# Patient Record
Sex: Male | Born: 1955
Health system: Southern US, Community
[De-identification: ages and names within clinical notes are randomized; demographics above are authoritative.]

## PROBLEM LIST (undated history)

## (undated) DIAGNOSIS — J449 Chronic obstructive pulmonary disease, unspecified: Secondary | ICD-10-CM

## (undated) DIAGNOSIS — H04123 Dry eye syndrome of bilateral lacrimal glands: Secondary | ICD-10-CM

## (undated) DIAGNOSIS — K644 Residual hemorrhoidal skin tags: Secondary | ICD-10-CM

## (undated) DIAGNOSIS — M170 Bilateral primary osteoarthritis of knee: Secondary | ICD-10-CM

## (undated) DIAGNOSIS — D369 Benign neoplasm, unspecified site: Secondary | ICD-10-CM

## (undated) DIAGNOSIS — I63411 Cerebral infarction due to embolism of right middle cerebral artery: Secondary | ICD-10-CM

## (undated) DIAGNOSIS — G44009 Cluster headache syndrome, unspecified, not intractable: Secondary | ICD-10-CM

## (undated) DIAGNOSIS — K5909 Other constipation: Secondary | ICD-10-CM

## (undated) DIAGNOSIS — M545 Low back pain: Secondary | ICD-10-CM

## (undated) DIAGNOSIS — G8929 Other chronic pain: Secondary | ICD-10-CM

## (undated) DIAGNOSIS — K648 Other hemorrhoids: Secondary | ICD-10-CM

## (undated) DIAGNOSIS — I700 Atherosclerosis of aorta: Secondary | ICD-10-CM

## (undated) DIAGNOSIS — K635 Polyp of colon: Secondary | ICD-10-CM

## (undated) DIAGNOSIS — N3941 Urge incontinence: Secondary | ICD-10-CM

## (undated) DIAGNOSIS — F411 Generalized anxiety disorder: Secondary | ICD-10-CM

## (undated) DIAGNOSIS — M25512 Pain in left shoulder: Secondary | ICD-10-CM

## (undated) DIAGNOSIS — M25511 Pain in right shoulder: Secondary | ICD-10-CM

## (undated) DIAGNOSIS — Z87442 Personal history of urinary calculi: Secondary | ICD-10-CM

## (undated) DIAGNOSIS — C801 Malignant (primary) neoplasm, unspecified: Secondary | ICD-10-CM

## (undated) DIAGNOSIS — H269 Unspecified cataract: Secondary | ICD-10-CM

## (undated) DIAGNOSIS — N200 Calculus of kidney: Secondary | ICD-10-CM

## (undated) DIAGNOSIS — I2699 Other pulmonary embolism without acute cor pulmonale: Secondary | ICD-10-CM

## (undated) DIAGNOSIS — H35033 Hypertensive retinopathy, bilateral: Secondary | ICD-10-CM

## (undated) DIAGNOSIS — R7611 Nonspecific reaction to tuberculin skin test without active tuberculosis: Secondary | ICD-10-CM

## (undated) DIAGNOSIS — E669 Obesity, unspecified: Secondary | ICD-10-CM

## (undated) HISTORY — DX: Polyp of colon: K63.5

## (undated) HISTORY — PX: FOOT SURGERY: SHX648

## (undated) HISTORY — DX: Hypertensive retinopathy, bilateral: H35.033

## (undated) HISTORY — DX: Low back pain: M54.5

## (undated) HISTORY — DX: Cerebral infarction due to embolism of right middle cerebral artery: I63.411

## (undated) HISTORY — DX: Dry eye syndrome of bilateral lacrimal glands: H04.123

## (undated) HISTORY — DX: Other constipation: K59.09

## (undated) HISTORY — DX: Other pulmonary embolism without acute cor pulmonale: I26.99

## (undated) HISTORY — DX: Pain in right shoulder: M25.511

## (undated) HISTORY — DX: Atherosclerosis of aorta: I70.0

## (undated) HISTORY — DX: Obesity, unspecified: E66.9

## (undated) HISTORY — DX: Urge incontinence: N39.41

## (undated) HISTORY — DX: Cluster headache syndrome, unspecified, not intractable: G44.009

## (undated) HISTORY — DX: Generalized anxiety disorder: F41.1

## (undated) HISTORY — DX: Residual hemorrhoidal skin tags: K64.4

## (undated) HISTORY — DX: Pain in left shoulder: M25.512

## (undated) HISTORY — DX: Nonspecific reaction to tuberculin skin test without active tuberculosis: R76.11

## (undated) HISTORY — DX: Calculus of kidney: N20.0

## (undated) HISTORY — DX: Other chronic pain: G89.29

## (undated) HISTORY — DX: Unspecified cataract: H26.9

## (undated) HISTORY — DX: Benign neoplasm, unspecified site: D36.9

## (undated) HISTORY — DX: Other hemorrhoids: K64.8

## (undated) HISTORY — DX: Bilateral primary osteoarthritis of knee: M17.0

---

## 1999-09-20 ENCOUNTER — Encounter: Payer: Self-pay | Admitting: Emergency Medicine

## 1999-09-20 ENCOUNTER — Emergency Department (HOSPITAL_COMMUNITY): Admission: EM | Admit: 1999-09-20 | Discharge: 1999-09-20 | Payer: Self-pay | Admitting: Emergency Medicine

## 2001-11-28 ENCOUNTER — Ambulatory Visit (HOSPITAL_BASED_OUTPATIENT_CLINIC_OR_DEPARTMENT_OTHER): Admission: RE | Admit: 2001-11-28 | Discharge: 2001-11-28 | Payer: Self-pay | Admitting: Orthopedic Surgery

## 2001-11-28 HISTORY — PX: I & D EXTREMITY: SHX5045

## 2003-03-15 ENCOUNTER — Emergency Department (HOSPITAL_COMMUNITY): Admission: AD | Admit: 2003-03-15 | Discharge: 2003-03-15 | Payer: Self-pay

## 2003-03-22 ENCOUNTER — Emergency Department (HOSPITAL_COMMUNITY): Admission: EM | Admit: 2003-03-22 | Discharge: 2003-03-22 | Payer: Self-pay | Admitting: Emergency Medicine

## 2003-03-28 ENCOUNTER — Encounter: Payer: Self-pay | Admitting: Family Medicine

## 2003-03-28 ENCOUNTER — Encounter: Admission: RE | Admit: 2003-03-28 | Discharge: 2003-03-28 | Payer: Self-pay | Admitting: Family Medicine

## 2003-05-05 ENCOUNTER — Ambulatory Visit (HOSPITAL_COMMUNITY): Admission: RE | Admit: 2003-05-05 | Discharge: 2003-05-05 | Payer: Self-pay | Admitting: Neurology

## 2004-08-08 DIAGNOSIS — I6930 Unspecified sequelae of cerebral infarction: Secondary | ICD-10-CM | POA: Insufficient documentation

## 2004-08-08 DIAGNOSIS — I63411 Cerebral infarction due to embolism of right middle cerebral artery: Secondary | ICD-10-CM

## 2004-08-08 HISTORY — DX: Cerebral infarction due to embolism of right middle cerebral artery: I63.411

## 2004-09-27 ENCOUNTER — Ambulatory Visit: Payer: Self-pay | Admitting: Family Medicine

## 2004-09-27 ENCOUNTER — Inpatient Hospital Stay (HOSPITAL_COMMUNITY): Admission: AD | Admit: 2004-09-27 | Discharge: 2004-10-04 | Payer: Self-pay | Admitting: Family Medicine

## 2004-09-28 ENCOUNTER — Ambulatory Visit: Payer: Self-pay | Admitting: Physical Medicine & Rehabilitation

## 2004-09-28 ENCOUNTER — Encounter (INDEPENDENT_AMBULATORY_CARE_PROVIDER_SITE_OTHER): Payer: Self-pay | Admitting: Cardiology

## 2004-09-30 DIAGNOSIS — I2699 Other pulmonary embolism without acute cor pulmonale: Secondary | ICD-10-CM

## 2004-09-30 DIAGNOSIS — Z86718 Personal history of other venous thrombosis and embolism: Secondary | ICD-10-CM | POA: Insufficient documentation

## 2004-09-30 HISTORY — DX: Other pulmonary embolism without acute cor pulmonale: I26.99

## 2004-10-04 ENCOUNTER — Inpatient Hospital Stay (HOSPITAL_COMMUNITY)
Admission: RE | Admit: 2004-10-04 | Discharge: 2004-10-27 | Payer: Self-pay | Admitting: Physical Medicine & Rehabilitation

## 2004-10-27 ENCOUNTER — Ambulatory Visit: Payer: Self-pay | Admitting: Family Medicine

## 2004-10-28 ENCOUNTER — Ambulatory Visit: Payer: Self-pay | Admitting: *Deleted

## 2004-10-29 ENCOUNTER — Encounter
Admission: RE | Admit: 2004-10-29 | Discharge: 2005-01-27 | Payer: Self-pay | Admitting: Physical Medicine & Rehabilitation

## 2004-11-10 ENCOUNTER — Ambulatory Visit: Payer: Self-pay | Admitting: Family Medicine

## 2004-11-25 ENCOUNTER — Ambulatory Visit: Payer: Self-pay | Admitting: Physical Medicine & Rehabilitation

## 2004-12-13 ENCOUNTER — Ambulatory Visit: Payer: Self-pay | Admitting: Family Medicine

## 2005-01-12 ENCOUNTER — Ambulatory Visit: Payer: Self-pay | Admitting: Family Medicine

## 2005-01-24 ENCOUNTER — Ambulatory Visit: Payer: Self-pay | Admitting: Family Medicine

## 2005-01-28 ENCOUNTER — Encounter
Admission: RE | Admit: 2005-01-28 | Discharge: 2005-03-28 | Payer: Self-pay | Admitting: Physical Medicine & Rehabilitation

## 2005-02-09 ENCOUNTER — Ambulatory Visit: Payer: Self-pay | Admitting: Family Medicine

## 2005-02-10 ENCOUNTER — Ambulatory Visit: Payer: Self-pay | Admitting: Physical Medicine & Rehabilitation

## 2005-02-10 ENCOUNTER — Encounter
Admission: RE | Admit: 2005-02-10 | Discharge: 2005-05-11 | Payer: Self-pay | Admitting: Physical Medicine & Rehabilitation

## 2005-03-10 ENCOUNTER — Ambulatory Visit: Payer: Self-pay | Admitting: Family Medicine

## 2005-03-23 ENCOUNTER — Ambulatory Visit: Payer: Self-pay | Admitting: Internal Medicine

## 2005-04-25 ENCOUNTER — Ambulatory Visit: Payer: Self-pay | Admitting: Family Medicine

## 2005-05-09 ENCOUNTER — Ambulatory Visit: Payer: Self-pay | Admitting: Internal Medicine

## 2005-05-11 ENCOUNTER — Ambulatory Visit: Payer: Self-pay | Admitting: Physical Medicine & Rehabilitation

## 2005-05-11 ENCOUNTER — Encounter
Admission: RE | Admit: 2005-05-11 | Discharge: 2005-08-09 | Payer: Self-pay | Admitting: Physical Medicine & Rehabilitation

## 2005-05-23 ENCOUNTER — Ambulatory Visit: Payer: Self-pay | Admitting: Hematology and Oncology

## 2005-06-09 ENCOUNTER — Ambulatory Visit: Payer: Self-pay | Admitting: Family Medicine

## 2005-07-18 ENCOUNTER — Ambulatory Visit: Payer: Self-pay | Admitting: Family Medicine

## 2005-08-10 ENCOUNTER — Encounter
Admission: RE | Admit: 2005-08-10 | Discharge: 2005-11-08 | Payer: Self-pay | Admitting: Physical Medicine & Rehabilitation

## 2005-08-10 ENCOUNTER — Ambulatory Visit: Payer: Self-pay | Admitting: Physical Medicine & Rehabilitation

## 2005-08-18 ENCOUNTER — Ambulatory Visit: Payer: Self-pay | Admitting: Internal Medicine

## 2005-09-13 ENCOUNTER — Encounter
Admission: RE | Admit: 2005-09-13 | Discharge: 2005-12-12 | Payer: Self-pay | Admitting: Physical Medicine & Rehabilitation

## 2005-09-15 ENCOUNTER — Ambulatory Visit: Payer: Self-pay | Admitting: Family Medicine

## 2005-09-16 ENCOUNTER — Ambulatory Visit: Payer: Self-pay | Admitting: Family Medicine

## 2005-10-17 ENCOUNTER — Ambulatory Visit: Payer: Self-pay | Admitting: Family Medicine

## 2005-10-19 ENCOUNTER — Ambulatory Visit: Payer: Self-pay | Admitting: Hematology and Oncology

## 2005-10-20 LAB — CBC WITH DIFFERENTIAL/PLATELET
BASO%: 0.5 % (ref 0.0–2.0)
Basophils Absolute: 0 10*3/uL (ref 0.0–0.1)
EOS%: 4.7 % (ref 0.0–7.0)
Eosinophils Absolute: 0.2 10*3/uL (ref 0.0–0.5)
HCT: 42.7 % (ref 38.7–49.9)
HGB: 14.1 g/dL (ref 13.0–17.1)
LYMPH%: 51.4 % — ABNORMAL HIGH (ref 14.0–48.0)
MCH: 27.2 pg — ABNORMAL LOW (ref 28.0–33.4)
MCHC: 32.9 g/dL (ref 32.0–35.9)
MCV: 82.6 fL (ref 81.6–98.0)
MONO#: 0.4 10*3/uL (ref 0.1–0.9)
MONO%: 8.2 % (ref 0.0–13.0)
NEUT#: 1.9 10*3/uL (ref 1.5–6.5)
NEUT%: 35.2 % — ABNORMAL LOW (ref 40.0–75.0)
Platelets: 205 10*3/uL (ref 145–400)
RBC: 5.17 10*6/uL (ref 4.20–5.71)
RDW: 14.3 % (ref 11.2–14.6)
WBC: 5.3 10*3/uL (ref 4.0–10.0)
lymph#: 2.7 10*3/uL (ref 0.9–3.3)

## 2005-10-20 LAB — COMPREHENSIVE METABOLIC PANEL
ALT: 35 U/L (ref 0–40)
AST: 21 U/L (ref 0–37)
Albumin: 4.5 g/dL (ref 3.5–5.2)
Alkaline Phosphatase: 59 U/L (ref 39–117)
BUN: 11 mg/dL (ref 6–23)
CO2: 24 mEq/L (ref 19–32)
Calcium: 9.7 mg/dL (ref 8.4–10.5)
Chloride: 107 mEq/L (ref 96–112)
Creatinine, Ser: 0.9 mg/dL (ref 0.4–1.5)
Glucose, Bld: 99 mg/dL (ref 70–99)
Potassium: 3.9 mEq/L (ref 3.5–5.3)
Sodium: 140 mEq/L (ref 135–145)
Total Bilirubin: 0.7 mg/dL (ref 0.3–1.2)
Total Protein: 7.8 g/dL (ref 6.0–8.3)

## 2005-11-17 ENCOUNTER — Ambulatory Visit: Payer: Self-pay | Admitting: Family Medicine

## 2005-11-25 LAB — CBC WITH DIFFERENTIAL/PLATELET
BASO%: 1 % (ref 0.0–2.0)
Basophils Absolute: 0.1 10*3/uL (ref 0.0–0.1)
EOS%: 3.8 % (ref 0.0–7.0)
Eosinophils Absolute: 0.2 10*3/uL (ref 0.0–0.5)
HCT: 42.9 % (ref 38.7–49.9)
HGB: 14.3 g/dL (ref 13.0–17.1)
LYMPH%: 51.5 % — ABNORMAL HIGH (ref 14.0–48.0)
MCH: 27.3 pg — ABNORMAL LOW (ref 28.0–33.4)
MCHC: 33.3 g/dL (ref 32.0–35.9)
MCV: 82.1 fL (ref 81.6–98.0)
MONO#: 0.3 10*3/uL (ref 0.1–0.9)
MONO%: 6.6 % (ref 0.0–13.0)
NEUT#: 1.9 10*3/uL (ref 1.5–6.5)
NEUT%: 37.1 % — ABNORMAL LOW (ref 40.0–75.0)
Platelets: 213 10*3/uL (ref 145–400)
RBC: 5.23 10*6/uL (ref 4.20–5.71)
RDW: 14.2 % (ref 11.2–14.6)
WBC: 5.2 10*3/uL (ref 4.0–10.0)
lymph#: 2.7 10*3/uL (ref 0.9–3.3)

## 2005-11-30 LAB — CARDIOLIPIN ANTIBODIES, IGG, IGM, IGA
Anticardiolipin IgA: <7 [APL'U] (ref <13)
Anticardiolipin IgG: 10 [GPL'U] (ref <11)
Anticardiolipin IgM: <7 [MPL'U] (ref <10)

## 2005-11-30 LAB — PROTEIN C, TOTAL AND FUNCTIONAL PANEL
Protein C, Functional: 146 % (ref 77–173)
Protein C, Total: 117 % (ref 63–153)

## 2005-11-30 LAB — PROTHROMBIN GENE MUTATION

## 2005-11-30 LAB — PROTEIN S, TOTAL AND FUNCTIONAL PANEL
Protein S Ag, Total: 106 % (ref 58–146)
Protein S, Functional: 83 % (ref 66–143)

## 2005-11-30 LAB — ANTITHROMBIN III: AntiThromb III Func: 110 % (ref 75–120)

## 2005-11-30 LAB — FACTOR 5 LEIDEN

## 2005-12-02 ENCOUNTER — Ambulatory Visit: Payer: Self-pay | Admitting: Hematology and Oncology

## 2005-12-13 ENCOUNTER — Encounter
Admission: RE | Admit: 2005-12-13 | Discharge: 2006-03-13 | Payer: Self-pay | Admitting: Physical Medicine & Rehabilitation

## 2005-12-27 ENCOUNTER — Ambulatory Visit: Payer: Self-pay | Admitting: Family Medicine

## 2006-01-17 ENCOUNTER — Ambulatory Visit: Payer: Self-pay | Admitting: Family Medicine

## 2006-02-15 ENCOUNTER — Ambulatory Visit: Payer: Self-pay | Admitting: Family Medicine

## 2006-03-06 ENCOUNTER — Ambulatory Visit: Payer: Self-pay | Admitting: Family Medicine

## 2006-04-03 ENCOUNTER — Ambulatory Visit: Payer: Self-pay | Admitting: Family Medicine

## 2006-04-17 ENCOUNTER — Ambulatory Visit: Payer: Self-pay | Admitting: Family Medicine

## 2006-05-01 ENCOUNTER — Ambulatory Visit: Payer: Self-pay | Admitting: Family Medicine

## 2006-05-16 ENCOUNTER — Ambulatory Visit: Payer: Self-pay | Admitting: Family Medicine

## 2006-06-12 ENCOUNTER — Ambulatory Visit: Payer: Self-pay | Admitting: Family Medicine

## 2006-06-28 ENCOUNTER — Ambulatory Visit: Payer: Self-pay | Admitting: Family Medicine

## 2006-07-05 ENCOUNTER — Ambulatory Visit: Payer: Self-pay | Admitting: *Deleted

## 2006-07-10 ENCOUNTER — Ambulatory Visit: Payer: Self-pay | Admitting: Family Medicine

## 2006-08-08 ENCOUNTER — Ambulatory Visit: Payer: Self-pay | Admitting: Family Medicine

## 2006-09-05 ENCOUNTER — Ambulatory Visit: Payer: Self-pay | Admitting: Family Medicine

## 2006-09-27 ENCOUNTER — Ambulatory Visit: Payer: Self-pay | Admitting: Family Medicine

## 2006-11-08 ENCOUNTER — Ambulatory Visit: Payer: Self-pay | Admitting: Family Medicine

## 2006-11-16 DIAGNOSIS — Z9189 Other specified personal risk factors, not elsewhere classified: Secondary | ICD-10-CM | POA: Insufficient documentation

## 2006-11-16 DIAGNOSIS — K5900 Constipation, unspecified: Secondary | ICD-10-CM | POA: Insufficient documentation

## 2006-11-16 DIAGNOSIS — E785 Hyperlipidemia, unspecified: Secondary | ICD-10-CM | POA: Insufficient documentation

## 2006-12-06 ENCOUNTER — Ambulatory Visit: Payer: Self-pay | Admitting: Family Medicine

## 2006-12-06 ENCOUNTER — Ambulatory Visit: Payer: Self-pay | Admitting: Hematology and Oncology

## 2006-12-08 LAB — CBC WITH DIFFERENTIAL/PLATELET
BASO%: 0.3 % (ref 0.0–2.0)
Basophils Absolute: 0 10*3/uL (ref 0.0–0.1)
EOS%: 3.2 % (ref 0.0–7.0)
Eosinophils Absolute: 0.2 10*3/uL (ref 0.0–0.5)
HCT: 38.8 % (ref 38.7–49.9)
HGB: 13.3 g/dL (ref 13.0–17.1)
LYMPH%: 49 % — ABNORMAL HIGH (ref 14.0–48.0)
MCH: 28.1 pg (ref 28.0–33.4)
MCHC: 34.3 g/dL (ref 32.0–35.9)
MCV: 82 fL (ref 81.6–98.0)
MONO#: 0.4 10*3/uL (ref 0.1–0.9)
MONO%: 7.8 % (ref 0.0–13.0)
NEUT#: 2 10*3/uL (ref 1.5–6.5)
NEUT%: 39.7 % — ABNORMAL LOW (ref 40.0–75.0)
Platelets: 189 10*3/uL (ref 145–400)
RBC: 4.73 10*6/uL (ref 4.20–5.71)
RDW: 14 % (ref 11.2–14.6)
WBC: 5 10*3/uL (ref 4.0–10.0)
lymph#: 2.4 10*3/uL (ref 0.9–3.3)

## 2006-12-08 LAB — COMPREHENSIVE METABOLIC PANEL
ALT: 27 U/L (ref 0–53)
AST: 19 U/L (ref 0–37)
Albumin: 4.3 g/dL (ref 3.5–5.2)
Alkaline Phosphatase: 62 U/L (ref 39–117)
BUN: 6 mg/dL (ref 6–23)
CO2: 23 mEq/L (ref 19–32)
Calcium: 9.4 mg/dL (ref 8.4–10.5)
Chloride: 107 mEq/L (ref 96–112)
Creatinine, Ser: 0.84 mg/dL (ref 0.40–1.50)
Glucose, Bld: 98 mg/dL (ref 70–99)
Potassium: 4 mEq/L (ref 3.5–5.3)
Sodium: 140 mEq/L (ref 135–145)
Total Bilirubin: 0.7 mg/dL (ref 0.3–1.2)
Total Protein: 7.6 g/dL (ref 6.0–8.3)

## 2007-01-03 ENCOUNTER — Ambulatory Visit: Payer: Self-pay | Admitting: Family Medicine

## 2007-02-05 ENCOUNTER — Ambulatory Visit: Payer: Self-pay | Admitting: Family Medicine

## 2007-02-19 ENCOUNTER — Ambulatory Visit: Payer: Self-pay | Admitting: Family Medicine

## 2007-03-14 ENCOUNTER — Encounter (INDEPENDENT_AMBULATORY_CARE_PROVIDER_SITE_OTHER): Payer: Self-pay | Admitting: *Deleted

## 2007-03-19 ENCOUNTER — Ambulatory Visit: Payer: Self-pay | Admitting: Family Medicine

## 2007-04-10 ENCOUNTER — Ambulatory Visit: Payer: Self-pay | Admitting: Family Medicine

## 2007-04-19 ENCOUNTER — Ambulatory Visit: Payer: Self-pay | Admitting: Family Medicine

## 2007-04-19 ENCOUNTER — Encounter
Admission: RE | Admit: 2007-04-19 | Discharge: 2007-04-19 | Payer: Self-pay | Admitting: Physical Medicine & Rehabilitation

## 2007-04-19 LAB — CONVERTED CEMR LAB
BUN: 9 mg/dL (ref 6–23)
CO2: 20 meq/L (ref 19–32)
Calcium: 9.4 mg/dL (ref 8.4–10.5)
Chloride: 110 meq/L (ref 96–112)
Cholesterol: 118 mg/dL (ref 0–200)
Creatinine, Ser: 0.75 mg/dL (ref 0.40–1.50)
Glucose, Bld: 95 mg/dL (ref 70–99)
HDL: 47 mg/dL (ref >39)
LDL Cholesterol: 56 mg/dL (ref 0–99)
PSA: 0.34 ng/mL (ref 0.10–4.00)
Potassium: 4.1 meq/L (ref 3.5–5.3)
Sodium: 143 meq/L (ref 135–145)
Total CHOL/HDL Ratio: 2.5
Triglycerides: 73 mg/dL (ref <150)
VLDL: 15 mg/dL (ref 0–40)

## 2007-05-17 ENCOUNTER — Ambulatory Visit: Payer: Self-pay | Admitting: Family Medicine

## 2007-06-14 ENCOUNTER — Ambulatory Visit: Payer: Self-pay | Admitting: Family Medicine

## 2007-07-12 ENCOUNTER — Ambulatory Visit: Payer: Self-pay | Admitting: Family Medicine

## 2007-08-09 ENCOUNTER — Ambulatory Visit: Payer: Self-pay | Admitting: Family Medicine

## 2007-09-06 ENCOUNTER — Ambulatory Visit: Payer: Self-pay | Admitting: Internal Medicine

## 2007-10-08 ENCOUNTER — Ambulatory Visit: Payer: Self-pay | Admitting: Internal Medicine

## 2007-10-22 ENCOUNTER — Ambulatory Visit (HOSPITAL_COMMUNITY): Admission: RE | Admit: 2007-10-22 | Discharge: 2007-10-22 | Payer: Self-pay | Admitting: Family Medicine

## 2007-11-05 ENCOUNTER — Ambulatory Visit: Payer: Self-pay | Admitting: Internal Medicine

## 2007-11-15 ENCOUNTER — Ambulatory Visit: Payer: Self-pay | Admitting: Internal Medicine

## 2007-11-20 ENCOUNTER — Ambulatory Visit (HOSPITAL_COMMUNITY): Admission: RE | Admit: 2007-11-20 | Discharge: 2007-11-20 | Payer: Self-pay | Admitting: Family Medicine

## 2007-12-03 ENCOUNTER — Ambulatory Visit: Payer: Self-pay | Admitting: Family Medicine

## 2007-12-03 ENCOUNTER — Encounter
Admission: RE | Admit: 2007-12-03 | Discharge: 2007-12-05 | Payer: Self-pay | Admitting: Physical Medicine & Rehabilitation

## 2007-12-04 ENCOUNTER — Ambulatory Visit: Payer: Self-pay | Admitting: Hematology and Oncology

## 2007-12-05 ENCOUNTER — Ambulatory Visit: Payer: Self-pay | Admitting: Physical Medicine & Rehabilitation

## 2007-12-18 ENCOUNTER — Encounter
Admission: RE | Admit: 2007-12-18 | Discharge: 2007-12-26 | Payer: Self-pay | Admitting: Physical Medicine & Rehabilitation

## 2007-12-19 ENCOUNTER — Ambulatory Visit: Payer: Self-pay | Admitting: Family Medicine

## 2008-01-01 ENCOUNTER — Ambulatory Visit: Payer: Self-pay | Admitting: Family Medicine

## 2008-01-29 ENCOUNTER — Ambulatory Visit: Payer: Self-pay | Admitting: Family Medicine

## 2008-01-29 LAB — CONVERTED CEMR LAB
INR: 2.4 — ABNORMAL HIGH (ref 0.0–1.5)
Prothrombin Time: 27.6 s — ABNORMAL HIGH (ref 11.6–15.2)

## 2008-02-26 ENCOUNTER — Encounter
Admission: RE | Admit: 2008-02-26 | Discharge: 2008-05-26 | Payer: Self-pay | Admitting: Physical Medicine & Rehabilitation

## 2008-02-28 ENCOUNTER — Ambulatory Visit: Payer: Self-pay | Admitting: Family Medicine

## 2008-02-28 ENCOUNTER — Ambulatory Visit: Payer: Self-pay | Admitting: Physical Medicine & Rehabilitation

## 2008-02-28 ENCOUNTER — Ambulatory Visit: Payer: Self-pay | Admitting: Internal Medicine

## 2008-02-28 ENCOUNTER — Encounter (INDEPENDENT_AMBULATORY_CARE_PROVIDER_SITE_OTHER): Payer: Self-pay | Admitting: *Deleted

## 2008-02-28 LAB — CONVERTED CEMR LAB

## 2008-03-04 ENCOUNTER — Ambulatory Visit: Payer: Self-pay | Admitting: Family Medicine

## 2008-03-04 LAB — CONVERTED CEMR LAB
INR: 2.6 — ABNORMAL HIGH (ref 0.0–1.5)
Prothrombin Time: 30 s — ABNORMAL HIGH (ref 11.6–15.2)

## 2008-04-01 ENCOUNTER — Ambulatory Visit: Payer: Self-pay | Admitting: Family Medicine

## 2008-04-01 LAB — CONVERTED CEMR LAB
INR: 2.5 — ABNORMAL HIGH (ref 0.0–1.5)
Prothrombin Time: 28.7 s — ABNORMAL HIGH (ref 11.6–15.2)

## 2008-04-24 ENCOUNTER — Ambulatory Visit: Payer: Self-pay | Admitting: Physical Medicine & Rehabilitation

## 2008-04-29 ENCOUNTER — Ambulatory Visit: Payer: Self-pay | Admitting: Family Medicine

## 2008-04-29 LAB — CONVERTED CEMR LAB
INR: 2.8 — ABNORMAL HIGH (ref 0.0–1.5)
Prothrombin Time: 31.9 s — ABNORMAL HIGH (ref 11.6–15.2)

## 2008-05-27 ENCOUNTER — Ambulatory Visit: Payer: Self-pay | Admitting: Family Medicine

## 2008-05-27 LAB — CONVERTED CEMR LAB
INR: 2.4 — ABNORMAL HIGH (ref 0.0–1.5)
Prothrombin Time: 27.4 s — ABNORMAL HIGH (ref 11.6–15.2)

## 2008-06-24 ENCOUNTER — Ambulatory Visit: Payer: Self-pay | Admitting: Family Medicine

## 2008-07-22 ENCOUNTER — Ambulatory Visit: Payer: Self-pay | Admitting: Family Medicine

## 2008-08-15 ENCOUNTER — Ambulatory Visit: Payer: Self-pay | Admitting: Family Medicine

## 2008-08-15 LAB — CONVERTED CEMR LAB
ALT: 33 units/L (ref 0–53)
AST: 25 units/L (ref 0–37)
Albumin: 4.5 g/dL (ref 3.5–5.2)
Alkaline Phosphatase: 56 units/L (ref 39–117)
BUN: 11 mg/dL (ref 6–23)
Basophils Absolute: 0 10*3/uL (ref 0.0–0.1)
Basophils Relative: 0 % (ref 0–1)
CO2: 20 meq/L (ref 19–32)
Calcium: 9.8 mg/dL (ref 8.4–10.5)
Chloride: 109 meq/L (ref 96–112)
Cholesterol: 112 mg/dL (ref 0–200)
Creatinine, Ser: 0.8 mg/dL (ref 0.40–1.50)
Eosinophils Absolute: 0.3 10*3/uL (ref 0.0–0.7)
Eosinophils Relative: 5 % (ref 0–5)
Glucose, Bld: 97 mg/dL (ref 70–99)
HCT: 44.4 % (ref 39.0–52.0)
HDL: 48 mg/dL (ref >39)
Hemoglobin: 14.3 g/dL (ref 13.0–17.0)
LDL Cholesterol: 50 mg/dL (ref 0–99)
Lymphocytes Relative: 51 % — ABNORMAL HIGH (ref 12–46)
Lymphs Abs: 2.7 10*3/uL (ref 0.7–4.0)
MCHC: 32.2 g/dL (ref 30.0–36.0)
MCV: 83.5 fL (ref 78.0–100.0)
Monocytes Absolute: 0.4 10*3/uL (ref 0.1–1.0)
Monocytes Relative: 7 % (ref 3–12)
Neutro Abs: 2 10*3/uL (ref 1.7–7.7)
Neutrophils Relative %: 37 % — ABNORMAL LOW (ref 43–77)
PSA: 0.3 ng/mL (ref 0.10–4.00)
Platelets: 183 10*3/uL (ref 150–400)
Potassium: 4.1 meq/L (ref 3.5–5.3)
RBC: 5.32 M/uL (ref 4.22–5.81)
RDW: 14.8 % (ref 11.5–15.5)
Sodium: 136 meq/L (ref 135–145)
Total Bilirubin: 0.5 mg/dL (ref 0.3–1.2)
Total CHOL/HDL Ratio: 2.3
Total Protein: 7.9 g/dL (ref 6.0–8.3)
Triglycerides: 68 mg/dL (ref <150)
VLDL: 14 mg/dL (ref 0–40)
WBC: 5.3 10*3/uL (ref 4.0–10.5)

## 2008-08-19 ENCOUNTER — Ambulatory Visit: Payer: Self-pay | Admitting: Family Medicine

## 2008-09-16 ENCOUNTER — Ambulatory Visit: Payer: Self-pay | Admitting: Family Medicine

## 2008-10-15 ENCOUNTER — Ambulatory Visit: Payer: Self-pay | Admitting: Family Medicine

## 2008-11-21 ENCOUNTER — Ambulatory Visit: Payer: Self-pay | Admitting: Family Medicine

## 2008-12-18 ENCOUNTER — Ambulatory Visit: Payer: Self-pay | Admitting: Family Medicine

## 2009-01-15 ENCOUNTER — Ambulatory Visit: Payer: Self-pay | Admitting: Family Medicine

## 2009-01-22 ENCOUNTER — Ambulatory Visit: Payer: Self-pay | Admitting: Family Medicine

## 2009-02-11 ENCOUNTER — Ambulatory Visit: Payer: Self-pay | Admitting: Internal Medicine

## 2009-02-11 ENCOUNTER — Ambulatory Visit: Payer: Self-pay | Admitting: Family Medicine

## 2009-03-12 ENCOUNTER — Ambulatory Visit: Payer: Self-pay | Admitting: Internal Medicine

## 2009-03-12 ENCOUNTER — Ambulatory Visit: Payer: Self-pay | Admitting: Family Medicine

## 2009-04-01 ENCOUNTER — Encounter
Admission: RE | Admit: 2009-04-01 | Discharge: 2009-04-30 | Payer: Self-pay | Admitting: Physical Medicine & Rehabilitation

## 2009-04-09 ENCOUNTER — Ambulatory Visit: Payer: Self-pay | Admitting: Family Medicine

## 2009-04-09 ENCOUNTER — Encounter
Admission: RE | Admit: 2009-04-09 | Discharge: 2009-06-24 | Payer: Self-pay | Admitting: Physical Medicine & Rehabilitation

## 2009-04-13 ENCOUNTER — Ambulatory Visit: Payer: Self-pay | Admitting: Physical Medicine & Rehabilitation

## 2009-04-28 ENCOUNTER — Ambulatory Visit: Payer: Self-pay | Admitting: Physical Medicine & Rehabilitation

## 2009-05-05 ENCOUNTER — Encounter
Admission: RE | Admit: 2009-05-05 | Discharge: 2009-06-23 | Payer: Self-pay | Admitting: Physical Medicine & Rehabilitation

## 2009-05-07 ENCOUNTER — Ambulatory Visit: Payer: Self-pay | Admitting: Family Medicine

## 2009-05-26 ENCOUNTER — Ambulatory Visit: Payer: Self-pay | Admitting: Physical Medicine & Rehabilitation

## 2009-06-04 ENCOUNTER — Ambulatory Visit: Payer: Self-pay | Admitting: Family Medicine

## 2009-07-14 ENCOUNTER — Ambulatory Visit: Payer: Self-pay | Admitting: Family Medicine

## 2009-08-06 ENCOUNTER — Encounter
Admission: RE | Admit: 2009-08-06 | Discharge: 2009-09-07 | Payer: Self-pay | Admitting: Physical Medicine & Rehabilitation

## 2009-08-10 ENCOUNTER — Ambulatory Visit: Payer: Self-pay | Admitting: Physical Medicine & Rehabilitation

## 2009-08-11 ENCOUNTER — Ambulatory Visit: Payer: Self-pay | Admitting: Family Medicine

## 2009-08-11 LAB — CONVERTED CEMR LAB
INR: 2.36 — ABNORMAL HIGH (ref <1.50)
Prothrombin Time: 25.6 s — ABNORMAL HIGH (ref 11.6–15.2)

## 2009-08-12 ENCOUNTER — Ambulatory Visit (HOSPITAL_COMMUNITY)
Admission: RE | Admit: 2009-08-12 | Discharge: 2009-08-12 | Payer: Self-pay | Admitting: Physical Medicine & Rehabilitation

## 2009-08-18 ENCOUNTER — Ambulatory Visit: Payer: Self-pay | Admitting: Family Medicine

## 2009-09-07 ENCOUNTER — Ambulatory Visit: Payer: Self-pay | Admitting: Physical Medicine & Rehabilitation

## 2009-09-22 ENCOUNTER — Ambulatory Visit: Payer: Self-pay | Admitting: Family Medicine

## 2009-10-27 ENCOUNTER — Ambulatory Visit: Payer: Self-pay | Admitting: Family Medicine

## 2009-11-02 ENCOUNTER — Encounter
Admission: RE | Admit: 2009-11-02 | Discharge: 2009-11-02 | Payer: Self-pay | Admitting: Physical Medicine & Rehabilitation

## 2009-11-25 ENCOUNTER — Ambulatory Visit: Payer: Self-pay | Admitting: Family Medicine

## 2009-12-09 ENCOUNTER — Ambulatory Visit: Payer: Self-pay | Admitting: Family Medicine

## 2009-12-16 ENCOUNTER — Ambulatory Visit: Payer: Self-pay | Admitting: Family Medicine

## 2009-12-23 ENCOUNTER — Ambulatory Visit: Payer: Self-pay | Admitting: Family Medicine

## 2010-01-06 ENCOUNTER — Ambulatory Visit: Payer: Self-pay | Admitting: Family Medicine

## 2010-01-26 ENCOUNTER — Ambulatory Visit: Payer: Self-pay | Admitting: Family Medicine

## 2010-02-09 ENCOUNTER — Ambulatory Visit: Payer: Self-pay | Admitting: Family Medicine

## 2010-02-23 ENCOUNTER — Ambulatory Visit: Payer: Self-pay | Admitting: Family Medicine

## 2010-03-16 ENCOUNTER — Encounter (INDEPENDENT_AMBULATORY_CARE_PROVIDER_SITE_OTHER): Payer: Self-pay | Admitting: Family Medicine

## 2010-03-16 LAB — CONVERTED CEMR LAB
INR: 4.09 — ABNORMAL HIGH (ref <1.50)
Prothrombin Time: 39.6 s — ABNORMAL HIGH (ref 11.6–15.2)

## 2010-03-25 ENCOUNTER — Encounter (INDEPENDENT_AMBULATORY_CARE_PROVIDER_SITE_OTHER): Payer: Self-pay | Admitting: Family Medicine

## 2010-03-25 LAB — CONVERTED CEMR LAB
INR: 2.3 — ABNORMAL HIGH (ref <1.50)
Prothrombin Time: 25.4 s — ABNORMAL HIGH (ref 11.6–15.2)

## 2010-04-08 ENCOUNTER — Encounter (INDEPENDENT_AMBULATORY_CARE_PROVIDER_SITE_OTHER): Payer: Self-pay | Admitting: Family Medicine

## 2010-04-08 LAB — CONVERTED CEMR LAB
INR: 2.63 — ABNORMAL HIGH (ref <1.50)
Prothrombin Time: 28.2 s — ABNORMAL HIGH (ref 11.6–15.2)

## 2010-04-29 ENCOUNTER — Encounter (INDEPENDENT_AMBULATORY_CARE_PROVIDER_SITE_OTHER): Payer: Self-pay | Admitting: Family Medicine

## 2010-04-29 LAB — CONVERTED CEMR LAB
Cholesterol: 92 mg/dL (ref 0–200)
HDL: 47 mg/dL (ref >39)
INR: 2.44 — ABNORMAL HIGH (ref <1.50)
LDL Cholesterol: 37 mg/dL (ref 0–99)
Prothrombin Time: 26.6 s — ABNORMAL HIGH (ref 11.6–15.2)
Total CHOL/HDL Ratio: 2
Triglycerides: 38 mg/dL (ref <150)
VLDL: 8 mg/dL (ref 0–40)

## 2010-11-09 NOTE — Assessment & Plan Note (Signed)
HISTORY:  [name] returns to clinic today for followup  evaluation.  We last saw him in this office on February 28, 2008.  At  that time, he had 200 IU of Botox and 8 injected into his left medial  forearm for spasticity of his left wrist and fingers.  He reports that  he had some improvement for about a week's time, but then it returned to  the baseline.  He is concerned about toe curling that he has in his left  lower extremity, which he reports is causing him some discomfort.  He is  using mainly Tylenol as needed for pain at the present time.  He does  get most of his medicines from [facility] at the present time.  His  stroke did occur in 2006.   MEDICATIONS:  1. Coumadin 5 mg daily.  2. Enteric-coated aspirin 81 mg daily.  3. Lipitor 10 mg 2 tablets daily.  4. Multivitamin daily.  5. Senokot p.r.n.  6. Tylenol Extra Strength 2 tablets nightly.  7. Doxepin nightly.   REVIEW OF SYSTEMS:  Positive for easy bleeding, constipation, and limb  swelling.   PHYSICAL EXAMINATION:  GENERAL:  Reasonably, well-appearing, middle-aged  adult male in mild-to-no acute discomfort.  VITAL SIGNS:  Blood pressure was 130/73 with pulse of 82, respiratory  rate 16, and O2 saturation 99% on room air.  MUSCULOSKELETAL:  He has 5/5 strength in his right upper and right lower  extremity.  Left lower extremity strength was 4-/5 with the exception of  ankle dorsiflexion at 2/5.  Left upper extremity strength was 2/5.   IMPRESSION:  1. 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  2. History of pulmonary embolism.  3. Dyslipidemia.  4. History of cluster headaches.   PLAN:  In the office today, we did explain that we will contact the  insurance to see if we can get approval for Botox injections into his  left lower extremity for toe-flexor spasticity.  We will give the  injection a try as he gained only temporary relief in his left upper  extremity after  recent Botox injections.  We will contact him once we  get approval from his insurance carrier.           ______________________________  Alexander English, M.D.     DC/MedQ  D:  04/24/2008 10:47:25  T:  04/25/2008 00:13:36  Job #:  [id number]

## 2010-11-09 NOTE — Assessment & Plan Note (Signed)
Alexander English returns to clinic today for followup evaluation.  We have  received approval for Botox injections in his left upper extremity.  We  are planning to do that today in the office.   The patient has had a history of right middle cerebral artery infarct  with left-sided hemiparesis.   The patient reports that overall he is doing well.  His medicines have  been unchanged.  He does not use any pain medicines but does remain on  Coumadin.  He ambulates with a single-point cane and is independent with  basic ADLs.   MEDICATIONS:  1. Coumadin 5 mg daily.  2. Enteric-coated aspirin 81 mg daily.  3. Lipitor 10 mg 2 tablets daily.  4. Multivitamin daily.  5. Senokot p.r.n.  6. Tylenol extra-strength 2 tablets nightly.  7. Sleep medicine nightly.   REVIEW OF SYSTEMS:  Positive for limb swelling.   PHYSICAL EXAMINATION:  GENERAL:  Reasonably well-appearing, middle-aged  adult male in mild-to-no acute discomfort.  VITAL SIGNS:  Blood pressure 128/65 with a pulse of 84, respiratory rate  18, and O2 saturation 98% on room air.  EXTREMITIES:  He has 2/5 strength in grip and 4/5 strength otherwise in  the left upper extremity.  Right upper extremity strength was 5/5 as was  right lower extremity.  Left lower extremity strength was 4-/5 with  exception of ankle dorsiflexion at 2/5.   IMPRESSION:  1. 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  2. History of pulmonary embolism.  3. Dyslipidemia.  4. History of cluster headaches.   In the office today, we did have the patient sign a consent for Botox  injections in the left upper extremity.  I prepared 200 international  units of botulinum toxin A.  I cleansed the left medial forearm and  injected the 200 international units into 3 sites, specifically the  flexor carpi ulnaris, flexor digitorum profundus, and flexor carpi  radialis.  The patient tolerated the procedure well and good hemostasis  was obtained at the one site that did show slight bleeding.  We will  plan on seeing the patient in followup in this office in approximately 2  months' time.  I explained to him that we are hoping for some  improvement in the flexor tone to be lesser than the left upper  extremity.  Hopefully, that will curb in the next 5-10 days and then  last for an indeterminate period of time.  We will plan on seeing him in  followup in approximately 2 months' time.           ______________________________  Ellwood Dense, M.D.     DC/MedQ  D:  02/28/2008 10:38:42  T:  02/29/2008 01:08:05  Job #:  [id number]

## 2010-11-09 NOTE — Assessment & Plan Note (Signed)
Mr. Looper returns to clinic today for followup evaluation.  I last  saw him on August 11, 2005, after right middle cerebral artery infarct  with left hemiparesis.  His original stroke was in February 2006.  He  has not followed up since February 2007 secondary to lack of insurance.  He reports that they made him wait approximately 2 years, and he now has  managed Medicare.   The patient reports that he is interested in Botox injections in his  left upper extremity.  He still has significant weakness in his left  upper extremity with some increased tone, especially in his wrist and  finger flexors.  The patient reports that his medicines have essentially  been unchanged with the only addition of Extra Strength Tylenol for left  flank pain and sleep medicine started at bedtime.  He also requested  TENS unit to be applied to his left upper extremity for motor return and  pain relief.   The patient reports that he is independent with basic ADLs and  ambulation using a single-point cane along with an ankle foot orthosis  on his left lower extremity.   MEDICATIONS:  1. Coumadin 5 mg.  2. Enteric-coated aspirin 81 mg.  3. Lipitor 10 mg 2 tablets daily.  4. Multivitamin daily.  5. Senokot p.r.n.  6. Tylenol Extra Strength 2 tablets at bedtime.  7. Sleep medicine at bedtime.   REVIEW OF SYSTEMS:  Positive for constipation and sleep apnea.   PHYSICAL EXAMINATION:  Reasonably well-appearing, middle-aged adult male  in mild-to-no acute discomfort.  Vitals were not obtained in the office today.  He has 5/5 strength in the right upper and right lower extremity.  Left-  sided strength was 2/5 in grip and 4/5 in elbow flexion and elbow  extension.  He does have slightly increased tone, especially in the  wrist flexors and finger flexors in his left upper extremity.  The left  lower extremity strength was generally 4-/5 with the exception of this  ankle dorsiflexors, which were 2/5.  He  does use an ankle foot orthosis  on his left lower extremity.   IMPRESSION:  1. 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  2. History of pulmonary embolism.  3. Dyslipidemia.  4. History of cluster headaches.   In the office today, we did give the patient a slip for TENS unit to be  obtained to use on his left upper extremity.  He has been through [facility] therapy, and he will be sent back there for this device.  We have  also started the process to obtain approval for Botox injections of 200  units into his left upper extremity for the wrist flexors and finger  flexors on his left upper extremity.  We will get that approval  hopefully over the next several weeks and then  set him up for an injection.  We will plan on seeing the patient in  followup in approximately 2-3 months' time once we get approved.           ______________________________  Ellwood Dense, M.D.     DC/MedQ  D:  12/05/2007 09:56:37  T:  12/05/2007 22:41:02  Job #:  [id number]

## 2010-11-12 NOTE — Assessment & Plan Note (Signed)
MEDICAL RECORD NUMBER:  [id number].   Alexander English returns to clinic today for followup evaluation. He reports  that he is doing well. He has finished therapy at this time and is doing a  home exercise program. His Coumadin continues to be managed by Dr. Audria Nine  at [facility]. He is ambulating with a single-point cane and lives on his  own doing his own bathing and dressing. He does wear an ankle-foot orthosis  on his left lower extremity. He still has some significant weakness of his  left hand but otherwise has fairly good use of his left upper extremity. He  is wondering about returning to driving his automatic vehicle.   MEDICATIONS:  1.  Coumadin 5 mg daily.  2.  Enteric-coated aspirin 81 mg daily.  3.  Lipitor 10 mg 2 tablets daily.  4.  Multivitamin daily.  5.  Senokot p.r.n.  6.  Magnesium citrate p.r.n.   REVIEW OF SYSTEMS:  Positive for constipation and hand swelling on the left.   PHYSICAL EXAMINATION:  Reasonably well appearing adult male with obvious  left sided weakness. Blood pressure is 107/67 with a pulse of 79,  respiratory rate 16, and O2 saturation 99% on room air. The patient has 5/5  strength throughout the right upper extremity and right lower extremity.  Left upper extremity strength was 4+/5 proximally and 3-/5 distally in the  left hand. Left leg strength was 4+/5.   IMPRESSION:  1.  Status post right middle cerebral artery infarction.  2.  Pulmonary embolism.  3.  Dyslipidemia.  4.  History of cluster headaches.   In the office today, no refill of medications is necessary. I have  encouraged him to continue doing his home exercise program, especially for  manual dexterity involving the left hand. We will plan on seeing the patient  in followup in this office in approximately two to three months' time.           ______________________________  Ellwood Dense, M.D.     DC/MedQ  D:  05/12/2005 09:45:56  T:  05/12/2005 11:01:18  Job #:   [id number]

## 2010-11-12 NOTE — Discharge Summary (Signed)
[name], [name] NO.:  [id number]   MEDICAL RECORD NO.:  [id number]          PATIENT TYPE:  IPS   LOCATION:  4032                         FACILITY:  [facility]   PHYSICIAN:  Ellwood Dense, M.D.   DATE OF BIRTH:  1955-09-17   DATE OF ADMISSION:  10/04/2004  DATE OF DISCHARGE:  10/27/2004                                 DISCHARGE SUMMARY   DISCHARGE DIAGNOSES:  1.  Embolic right inferior infarct, questionable etiology.  2.  Bilateral pulmonary embolism.  3.  Dyslipidemia.  4.  Headaches, resolving.   HISTORY OF THE PRESENT ILLNESS:  Alexander English is a 55 year old male with a  history of CVA on August 09, 2004 after a trip to [place].  He returned  from [place] on September 27, 2004 with complaints of right pleuritic chest pain  and blood-tinged sputum.  He was evaluated at [facility] and was sent to  [facility].  He was started on IV antibiotics for presumed  pneumonia.  CT angiography of the chest showed a large pulmonary embolus in  the right middle to lower lobes, and left lower lobe.  The patient was  started on Coumadin.  MRI of the brain showed acute subacute infarct of the  right middle cerebral artery.  Bilateral lower extremities Dopplers showed  no DVT.  Carotid Dopplers showed no internal carotid stenosis.  Transcranial  Doppler done was negative.  Neuro was consulted for input and recommended a  TEE as well as hypocoagulable studies; however, this could not be done as  the patient was already on heparin.  The patient is to continue on Coumadin  for at least six months for treatment of PE.  TEE done showed mitral regurg,  aortic valve and tricuspid valve, no ______________, no masses or thrombus.   The patient continued to have left hemiparesis with occasional slurred  speech especially when fatigued.  Therapies were initiated.  The patient was  at minimal assistance for transfers, moderate assistance for weight shift on  the left lower  extremity and moderate assistance for ambulating 60 feet.  PT/OT were consulted for progression.   PAST MEDICAL HISTORY:  The past medical history is significant for:  1.  Cluster headaches.  2.  CVA in February 2006.   ALLERGIES:  No known drug allergies.   SOCIAL HISTORY:  The patient lives alone in a two-level home with two steps  at the entry.  The plans are for him to be discharged to a friend's home.  He has a history of one pack/day of tobacco use and drinks a 12-pack of  alcohol on the weekends.   HOSPITAL COURSE:  Alexander English was admitted to rehab on October 04, 2004 for inpatient therapies to consist of PT and OT daily.  After admission  the patient was cross covered with Lovenox to keep Coumadin on a therapeutic  basis.  The patient was maintained on Lipitor 80 mg initially for is  dyslipidemia; however, check of LFTs on October 05, 2004 showed elevation in  the SGOT at 74, SGPT at 167, and T bili of 0.4.  The patient's Lipitor dose  was dropped to 20 mg a day.  Check of lytes revealed sodium of 138,  potassium 3.8, chloride 109, CO2 24, BUN 8, creatinine 1.0, and glucose 93.  CBC revealed a hemoglobin of 14.6, hematocrit 44.6, white count 5.7 and  platelets 574,000.  Recheck of LFTs on October 10, 2004 showed improvement  with albumin at 3.1, AST 29, ALT 70, alkaline phos 96, and T bili 0.4.   The patient had been stable during his stay.  The left hemiparesis was  slowly improving with some improvement in his strength proximally in his  left lower extremity.  The left upper extremity remained flaccid.  He was  noted to have some __________ shoulder.  Currently the patient is at  modified independent level for transfers.  In areas without distraction he  requires minimal assistance than in public areas.  He needs supervision to  ambulate 200 feet with a walker on a level surface.  The patient is noted to  be unsafe on stairs.  He is currently modified independent for  bathing,  modified independent for upper body dressing, and supervision with  assistance for lower body dressing.  Twenty-four-hour supervision is  recommended and this was attempted with a friend.  The patient's girl friend  is to provide supervision in the evenings.  He was advised to have  supervision for ambulation, otherwise he is modified independent at  wheelchair level.  He was also set up to follow up at [facility] in the  P.M. on Oct 27, 2004 to be set up as a patient as well as to assist with  medications.  Further follow up therapies are set up at [facility] to begin on Oct 29, 2004.  At the time of  discharge the patient's Coumadin is supertherapeutic with an INR of 3.1.  he  was discharged on 5 mg a day of Coumadin.  The physician at [facility] is  to adjust follow Coumadin.   DISCHARGE MEDICATIONS:  1.  Aspirin 81 mg a day.  2.  Coumadin 5 mg q.p.m.  3.  Lipitor 20 mg q.h.s.  4.  Senokot S 2 q.h.s.  5.  Multivitamin per day.   DIET:  The diet is regular.   ACTIVITY:  No strenuous activities.  He is to use his wheelchair and may be  up with assistance.   FOLLOW UP:  The patient is to follow up with Dr. Thomasena Edis on November 26, 2004 at  2:20 P.M.  He is to follow up with Dr. Pearlean Brownie in three to four weeks.  He is  to follow up with LMD at [facility] for Coumadin management and routine  medical care.  Follow up at [facility] on Oct 29, 2004, Friday,  at 9:30.   SPECIAL INSTRUCTIONS:  No alcohol.  No smoking.  No driving.      PP/MEDQ  D:  10/27/2004  T:  10/28/2004  Job:  [id number]   cc:   William A. Leveda Anna, M.D.  Fax: [phone]   Pramod P. Pearlean Brownie, MD  Fax: [phone]   [facility]

## 2010-11-12 NOTE — Assessment & Plan Note (Signed)
MEDICAL RECORD NUMBER:  [id number]   Mr. Metts returns to the clinic today for follow-up evaluation. He  reports that he is doing well. His main concern is about his left arm where  he feels he has had worsening of the strength. He especially is concerned  about the weakness that he is reporting in his left hand. He remains  independent with bathing and dressing. He ambulates with a left ankle-foot  orthosis along with a single-point cane. He also drives his vehicle. He was  discharged from therapy previously in November 2006. He continues to get  healthcare through [facility] with Dr. Audria Nine. They also manage his  Coumadin and check his INR periodically.   In terms of home exercise program, he reports that he has a splint that he  is supposed to use on his left upper extremity. He also reports that he was  instructed to do table slides. He has Thera-Bands but he reports that he  only uses those on his lower extremities. He does not have any griping  exercise that he does on a routine basis.   MEDICATIONS:  1.  Coumadin 5 mg daily.  2.  Enteric-coated aspirin 81 mg daily.  3.  Lipitor 10 mg two tablets daily.  4.  Multivitamin daily.  5.  Senokot p.r.n.  6.  Magnesium citrate p.r.n.   REVIEW OF SYSTEMS:  Positive for constipation and limb swelling.   PHYSICAL EXAMINATION:  Reasonably well-appearing middle-aged adult male in  mild to no acute discomfort. Blood pressure 120/65 with a pulse of 82,  respiratory rate 16, and O2 saturation 99% on room air. Right arm and leg  strength was 5/5. Left leg strength was 4+/5. Left grip strength was 3+/5  and left upper extremity strength otherwise was 4-/5.   IMPRESSION:  1.  Status post right middle cerebral artery infarction.  2.  Pulmonary embolism.  3.  Dyslipidemia.  4.  History of cluster headaches.   In the office today, no refill on medication is necessary. We have referred  him back to outpatient occupational therapy to  see if they can give him  gripping exercise and evaluate him for any need for restart of therapies for  short-term. He does need a slightly more regimented home exercise program if  he is going to continue that on his own outside of therapy. We will plan on  seeing him in follow-up in this office in approximately 2 months' time.           ______________________________  Ellwood Dense, M.D.     DC/MedQ  D:  08/11/2005 10:29:22  T:  08/11/2005 11:31:22  Job #:  [id number]

## 2010-11-12 NOTE — Consult Note (Signed)
Alexander English, Alexander English           ACCOUNT NO.:  [id number]   MEDICAL RECORD NO.:  [id number]          PATIENT TYPE:  INP   LOCATION:  6711                         FACILITY:  [facility]   PHYSICIAN:  Pramod P. Pearlean Brownie, MD    DATE OF BIRTH:  1955-11-08   DATE OF CONSULTATION:  09/29/2004  DATE OF DISCHARGE:                                   CONSULTATION   TRANSCRANIAL DOPPLER BUBBLE STUDY   REFERRING PHYSICIAN:  Dr. Doralee Albino.   REASON FOR REFERRAL:  Stroke and rule out paradoxical embolism.   HISTORY OF PRESENT ILLNESS:  Alexander English is a 55 year old [place] male  who developed sudden onset of left-sided weakness on August 09, 2004 about  48 hours after a long 17-hour plane ride to [place]. He was hospitalized at  a local hospital in [place] where he was treated for presumed stroke. However,  a neurologist evaluation was not done since that hospital was not well  equipped. He was put on aspirin and subsequently improved. The patient did  have a residual left-sided weakness and was able to walk with some  assistance. As he was getting ready to come back to the [place] about  a month later, he noticed sudden onset of chest pain in the right side of  his lungs. He was treated empirically with antibiotics for presumed  pneumonia, but after he was admitted to this hospital 2 days ago a CT scan  of the chest is suggestive of pulmonary embolism. Question of paradoxical  embolism has been raised and transcranial Doppler bubble study is requested  to look for intracardiac shunt.   PAST MEDICAL HISTORY:  Significant for smoking, alcoholism.   MEDICATION ALLERGIES:  None.   HOME MEDICATIONS:  Aspirin, vitamins, Augmentin.   SOCIAL HISTORY:  He lives alone. He smokes one pack per day for 30 years. He  is presently unemployed.   PHYSICAL EXAMINATION:  GENERAL:  Reveals a well-built African male who is  not in distress.  VITAL SIGNS:  Is afebrile, pulse rate 72 per minute and  regular,  respirations 16 per minute, distal pulses well felt.  NEUROLOGIC:  He is awake, alert, oriented x3, with normal speech and  language function. There is no aphasia, apraxia, or dysarthria. Pupils are  equally reactive. Eye movements are full range without nystagmus. There is  no gaze paucity or field cut. There is left facial weakness. There is  minimum left upper extremity drift. Proximal strength in the left upper  extremity is 4/5 but strength in the left grip and hand is 0/5. Left lower  extremity proximal strength is 3 to 4-/5 and distally his ankle is 0/5. He  has decreased sensation on the left side. Coordination is impaired on the  left.   DATA REVIEW:  MRI scan of the brain shows a subacute right parietal  infarction with minor hemorrhagic component. There is no mass effect or  midline shift. MRA shows diminished peripheral branches of the right middle  cerebral artery. Carotid ultrasound shows no significant stenosis.   IMPRESSION:  A 55 year old gentleman with embolic right parietal infarction  of unclear  etiology. Certainly, his long plane ride as well as recent  history pulmonary embolism puts paradoxical embolization through  intracardiac shunt a likely possibility.   PLAN:  Transcranial Doppler bubble study is performed at the bedside after  obtaining verbal informed consent from the patient. He is given opportunity  to ask questions which were answered. IV line had previously been inserted  in the right forearm. Agitated saline was injected into the vein. No high  intensity transient signals or HITS were identified. The procedure was  repeated twice after performing Valsalva maneuver with moderate effort and  again, no HITS were noted.   IMPRESSION:  Negative transcranial Doppler bubble study. No evidence of  right-to-left intracardiac communication by this study. Since the patient  clearly has had embolic infarction I would continue evaluation for source of   embolus with transesophageal echocardiogram as well as hypercoagulable  workup. Since he is already on heparin and Coumadin may not be able to do a  full workup. Would recommend checking factor V Leiden, homocysteine, and  antiphospholipid antibodies only at present. I agree with anticoagulation  with INR level of 2-3. Physical, occupational, and rehab consult.   Thank you for the referal.      PPS/MEDQ  D:  09/29/2004  T:  09/29/2004  Job:  [id number]

## 2010-11-12 NOTE — Discharge Summary (Signed)
NAMEATA, PECHA           ACCOUNT NO.:  [id number]   MEDICAL RECORD NO.:  [id number]          PATIENT TYPE:  INP   LOCATION:  6711                         FACILITY:  [facility]   PHYSICIAN:  Sharin Grave, MD  DATE OF BIRTH:  08/04/1955   DATE OF ADMISSION:  09/27/2004  DATE OF DISCHARGE:  10/04/2004                                 DISCHARGE SUMMARY   DISCHARGE DIAGNOSES:  1.  Pulmonary embolism.  2.  Hyperlipidemia.  3.  Constipation.  4.  Status post right hemispheric CVA/left sided deficit.   DISCHARGE MEDICATIONS:  1.  Lipitor 8 mg p.o. daily.  2.  Aspirin 325 mg p.o. daily.  3.  Lovenox 25 mg subcutaneous b.i.d.  4.  Coumadin 7.5 mg p.o. daily.  This was still adjusted per pharmacy.  5.  Protonix 40 mg p.o. daily.  6.  Percocet 500/5 mg p.o. q.4h. p.r.n.  7  Colace 100 mg p.o. daily.  1.  Senokot 12 tablets p.o. b.i.d. p.r.n. for constipation.   DISCHARGE INSTRUCTIONS:  The patient was discharged to the rehabilitation  unit to continue physiotherapy and rehabilitation therapy and was instructed  to go to the primary practice center 2-5 days after discharge from the rehab  unit for INR check.   HOSPITAL COURSE:  Alexander English is a 55 year old male, originally from  [place], with history of a right hemisphere CVA in August 08, 2004, after  17 hours of plane trip to [place].  He had a short hospitalization in  [place] and was treated for stroke with aspirin, but no further neurological  workup was done.  The patient then developed a residual left sided deficit  which decreased his overall mobility.  The [place]  in a [place] plane trip.  Upon his arrival, he presented to the Urgent Care  complaining of cough and blood-tinged yellow sputum and right pleuritic pain  for the last four days.  He denied any fever, but he did have chills.  No  positive contacts.  As symptoms started 1-2 days prior to his trip back to  [place] he was  evaluated in [place] and received three days of antibiotic  therapy with Augmentin.  He did have a chest x-ray done at Urgent Care on  April 3 that showed right middle lobe/right lower lobe findings concerning  for pneumonia.  He was sent to [facility] as a direct admission to  be treated for this condition.  The patient was diagnosed with a large acute  pulmonary embolism by spiral CT scan, and he was admitted for purpura  anticoagulation.   ADMISSION LABORATORY DATA:  EKG showing left ventricular hypertrophy and  normal sinus rhythm.  Subsequent EKG showed a practically AV block.  Lower  extremity Doppler showed no evidence of DVT, superficial thrombosis or  Baker's cyst.  Carotid Doppler's showed no significant ICA stenosis.  Echocardiogram showed normal left ventricular size and normal ventricular  systolic function with ejection fraction 55-65% with no intracardiac source  of thromboli observed.  PCD bubble study was negative.  No evidence of right  to left  intracardiac communications.   MRI of the brain showed stroke of the right middle cerebral artery.  Spiral  chest CT showed large pulmonary embolus, mainly to the right, middle and  lower lobes with segmental involvement of the left lower lobe associated  with interstitial and air space opacity suggesting areas of pulmonary  __________.   Other admission labs:  White blood cell count 9.2, hemoglobin 14.5,  hematocrit 44.7, platelets 357,000.  Sodium 137, potassium 3.5, chloride  103, CO2 25, BUN 7, creatinine 0.9, glucose 102, potassium 9.2, T-bilirubin  1.1, phosphatase alkaloses 119, AST 63, ALT 85, total protein 7.5, albumin  3, PT 13.1, PTT 37.   PROBLEMS:  #1 - LARGE PULMONARY EMBOLISM.  Initially, the patient was loaded  with heparin and subsequently placed on a heparin drip.  He was then  transitioned to Lovenox and started on Coumadin at the same time.  He had  his INR monitored daily.  The goal for his INR  was 2.5-3.  On discharge day,  INR was 2.1.  The patient was still on Lovenox to continue until therapeutic  INR to continue Lovenox 24 hours plus therapeutic INR and to continue  Coumadin dosing as per pharmacy.  The patient most likely was instructed to  continue Coumadin therapy for 12 months.  The patient's pleuritic chest pain  improved greatly during admission, and on discharge date he still had very  minimal procedural right sided pleuritic pain that was well controlled with  Percocet q.4h. p.r.n.  Also on discharge day, the patient did not have any  oxygen requirements, and his oxygen saturation was above 97% on room air.   #2 - HYPERLIPIDEMIA.  During admission, the patient had fasting lipid  profile that showed a cholesterol of 155, triglycerides 105, HDL 32, LDL  102.  Given that the patient's HDL was below 39, and he had an increased LDL  and his history of CVA and PE, we decided to start treatment with Lipitor 80  mg p.o. daily.  This problem should continue to be monitored as outpatient.   #3 - STATUS POST RIGHT MIDDLE CEREBRAL ARTERY STROKE.  We transitioned the  patient from heparin drip to Lovenox subcutaneously, but the patient could  be transferred to the rehabilitation unit and continue to work with physical  therapy to regain mobility and to try to regain the possibility of  independent living.   #4 - CONSTIPATION.  During admission, the patient was constipated.  He  refused to have enema.  He was passing gas and was not distended.  Did not  have any nausea or vomiting and was having good p.o.  We just started him on  Colace and Senokot and most likely this issue would improve as the patient's  mobility increases in the rehabilitation unit.   DISPOSITION:  The patient was transferred to rehabilitation unit in improved  and stable conditions.  Issues to follow.  The patient Coumadin dose is still being adjusted as per pharmacy.  PT/INR should be monitored on a  daily  basis to reach a goal for an INR of 2.5-3.  Once that is achieved, his  Lovenox can be discontinued.   OTHER ISSUES:  1.  Once the patient is off anticoagulation therapy, he should undergo      studies to assess for hypercoagulable disorders.  2.  The patient has a history of tobacco abuse.  He reportedly was not      craving from smoking while in the hospital,  but he could benefit from      smoking cessation as an outpatient and probably a nicotine patch.      AM/MEDQ  D:  12/13/2004  T:  12/14/2004  Job:  [id number]

## 2010-11-12 NOTE — Consult Note (Signed)
Alexander English, Alexander English           ACCOUNT NO.:  [id number]   MEDICAL RECORD NO.:  [id number]          PATIENT TYPE:  INP   LOCATION:  6711                         FACILITY:  [facility]   PHYSICIAN:  Gustavus Messing. Orlin Hilding, M.D.DATE OF BIRTH:  1956/03/22   DATE OF CONSULTATION:  09/28/2004  DATE OF DISCHARGE:                                   CONSULTATION   REASON FOR ADMISSION:  Two-month old stroke.   HISTORY OF PRESENT ILLNESS:  [name] is a 55 year old right-handed  black man with a history of significant for a stroke sustained on August 08, 2004. He had been on a 12-hour plane ride from [place] to [place] for a  funeral. Shortly after he got off the plane, he developed a sudden onset of  left-sided weakness. He was seen at the clinic, given aspirin and physical  therapy but did not have any imaging. He was told he had a stroke. He  remained in [place] for two months and did say that he had some chest pain  and coughing before he left [place]. He presented to the emergency room here  and was found to have a large pulmonary embolus. He sporadically would take  aspirin prior to this whole process but was not taking it on a regular  basis.   REVIEW OF SYMPTOMS:  Positive for cough with some chest pain.   PAST MEDICAL HISTORY:  1.  Significant for the stroke which occurred August 08, 2004 in [place].  2.  History of cluster headaches for which she sees Dr. Genene Churn. Love.  3.  He had an abscess on his left thumb which was treated.   MEDICATIONS:  Aspirin, Zithromax, Rocephin, Coumadin, heparin, and  Ibuprofen. I am not sure if he is on the aspirin and the Coumadin currently.  He was given Augmentin for several days.   ALLERGIES:  No known drug allergies.   SOCIAL HISTORY:  He is single. Not currently working. He does smoke a pack a  day. Some alcohol. He does have a history of DUI while in college and has  been to AA. No cocaine.   FAMILY HISTORY:  Fairly  unremarkable.   PHYSICAL EXAMINATION:  VITAL SIGNS:  Temperature is 97.9, pulse 83, blood  pressure 107/58. Saturating 96% on room air.  HEENT:  Normocephalic and atraumatic.  NECK:  Supple without bruits.  HEART:  Regular rate and rhythm.  NEUROLOGICAL:  He is awake, alert, and oriented with normal language and  cognition. Cranial nerves:  His pupils are equal and reactive. Visual fields  are full to confront, at least blink to threat and finger counting.  Extraocular movements are intact. Facial sensation is normal. He does have a  central fascial droop and is mildly dysarthric. Motor exam:  He is a fairly  dense left upper extremity weakness. It is about 2/5 and in the lower  extremity it is about 3/5, normal on the right. Did not have him ambulate.  He has brisk reflexes on the left compared to the right, upgoing toe on the  left and slightly increased tone on the left. Coordination: He  is able to do  finger-to-nose and heel-to-shin on the right. He cannot do them well on the  left because of the weakness. Sensory:  Decreased on the left.   LABORATORY DATA:  MRI of the brain shows a subacute infarction in the right  MCA distribution, still positive on diffusion-weighted imaging. Involves the  deep white mater, corona radiata, parietal cortex on the right. His MRA  shows some decreased flow in the middle cerebral artery territory on the  right but nothing correctable more proximally. He did have carotid studies  done that showed no significant plaque or stenosis. A 2-D echocardiogram is  pending. Labs are fairly unremarkable.   IMPRESSION:  Right brain embolic stroke, likely cardioembolic. The question  is whether or not he might have a cardiac defect and have a right to left  shunt. This could have all happened at the same time with a deep vein  thrombosis producing the pulmonary embolus and a clot in the brain as well  through a paradoxical means. There is no history of other risk  factors other  than his smoking.   RECOMMENDATIONS:  Agree with echocardiogram and bubble study. Possibly need  a TEE. May need to consider coagulopathy.      CAW/MEDQ  D:  09/28/2004  T:  09/28/2004  Job:  [id number]

## 2010-11-12 NOTE — Op Note (Signed)
[place]  Patient:    Alexander English, Alexander English Visit Number: [id number] MRN: [id number]          Service Type: DSU Location: [facility] Attending Physician:  Marlowe Shores Dictated by:   Artist Pais Mina Marble, M.D. Proc. Date: 11/28/01 Admit Date:  11/28/2001                             Operative Report  PREOPERATIVE DIAGNOSIS: Left thumb thenar space abscess.  POSTOPERATIVE DIAGNOSIS: Left thumb thenar space abscess.  OPERATION/PROCEDURE: Incision and drainage of left thumb thenar space.  SURGEON: Artist Pais. Mina Marble, M.D.  ASSISTANT: Junius Roads. Ireton, P.A.C.  ANESTHESIA: General.  TOURNIQUET TIME: Twenty-one minutes.  COMPLICATIONS: None.  DRAINS: None.  CULTURES: Two sent.  WOUND PACKING: Iodoform gauze.  OPERATIVE REPORT: The patient was taken to the operating room and after the induction of adequate general anesthesia the left upper extremity was prepped and draped in the usual sterile fashion.  An Esmarch was used to exsanguinate the limb and the tourniquet was inflated to 250 mm Hg.  At this point in time a puncture wound that had been in the left hand and had been treated with local wound care and p.o. antibiotics was approached.  The puncture wound was about 1 cm in length.  It was extended proximally and distally along the thenar crease and incision taken down through the skin and subcutaneous tissues.  There was some purulent-type material that was cultured.  The wound was then irrigated with antibiotic-impregnated saline.  We went ahead and opened the thenar space and probed this.  There were no large loculated collections of fluid evident.  This was again also irrigated thoroughly with antibiotic-impregnated solution and this whole area was packed with Iodoform gauze and the skin was loosely closed with 4-0 nylon over the gauze.  The patient was then placed in a sterile dressing of 4 x 4s, fluffs, and a Coban wrap.  The  patient tolerated the procedure well and went to recovery in stable fashion. Dictated by:   Artist Pais Mina Marble, M.D. Attending Physician:  Marlowe Shores DD:  11/28/01 TD:  11/29/01 Job: [id number]

## 2010-11-12 NOTE — Cardiovascular Report (Signed)
Alexander English, Alexander English NO.:  [id number]   MEDICAL RECORD NO.:  [id number]          PATIENT TYPE:  INP   LOCATION:  6711                         FACILITY:  [facility]   PHYSICIAN:  Darlin Priestly, MD  DATE OF BIRTH:  May 15, 1956   DATE OF PROCEDURE:  10/01/2004  DATE OF DISCHARGE:                              CARDIAC CATHETERIZATION   PROCEDURES:  Transesophageal echocardiogram.   ATTENDING PHYSICIAN:  Darlin Priestly, M.D.   COMPLICATIONS:  None.   INDICATIONS:  [name] is a 55 year old male patient of Dr. Leveda Anna  admitted on September 27, 2004 with right-sided chest pain found to have large  pulmonary embolus.  The patient also has history of cerebrovascular accident  in February 2006 as well as tobacco use.  He did undergo transcranial  Dopplers which were negative for evidence of PFO. However, given his history  of PE with cerebrovascular accident, he is now referred for TEE to rule out  possible source of embolus or PFO.   DESCRIPTION OF OPERATION:  After obtaining informed written consent, the  patient was brought to the endoscopy suite in the fasting suite.  The  patient underwent successful and uncomplicated transesophageal  echocardiogram.  There is mild concentric LVH with low normal systolic  function. Estimated EF of approximately 45-50%.  There appears to be mild  posterior hypokinesis.   Structurally normal aortic valve with no evidence of significant aortic  stenosis and mild aortic regurgitation.   Structurally  normal mitral valve with trivial to mild mitral regurgitation.   Structurally normal tricuspid valve with trivial tricuspid regurgitation.   Trivial pulmonic regurgitation.   No evidence of intracardiac mass or thrombus noted.   There is no evidence of PFO by color contrast echo.   There is normal descending thoracic aorta.   CONCLUSION:  Successful transesophageal echocardiogram with findings noted  above.  There is no  evidence of intracardiac mass or thrombus present.  There is no evidence of PFO.      RHM/MEDQ  D:  10/01/2004  T:  10/01/2004  Job:  [id number]

## 2010-11-12 NOTE — Assessment & Plan Note (Signed)
MEDICAL RECORD NUMBER:  [id number]   DATE OF BIRTH:  12/01/1955   DATE OF EVALUATION:  November 26, 2004   INTERVAL HISTORY:  [name] is a 55 year old [place] male recently  admitted September 27, 2004 with right-sided chest pain.  He was found to have a  large pulmonary embolism.  He also had history of stroke in February 2006  while he was attending his father's funeral in [place].  He apparently  attended some outpatient therapies in [place] but then had persistent  weakness at the time of the diagnosis of the pulmonary embolism.   When he was evaluated at [facility], he was sent to [facility]  Emergency Room.  He was started on IV antibiotics for presumed pneumonia.  Subsequent CT angiography of the chest showed a large pulmonary embolism in  the right middle to lower lobes and left lower lobe.  He was started on  Coumadin at that point.  MRI scan showed an acute/subacute infarct of the  right middle cerebral artery.  Bilateral lower extremity Doppler's showed no  DVT.  Carotid Doppler studies show no internal carotid artery  stenosis.  Transcranial Doppler's were negative.   Neurology was consulted and it was recommended that the patient be continued  on Coumadin.  He eventually stabilized and was moved to the rehabilitation  unit October 04, 2004 and remained there through discharge Oct 27, 2004.   Since discharge patient has been living in his own home.  He has children  and the children's mother who check in on him at the home.  He does require  some help with bathing and dressing and also they help prepare his food.  He  is otherwise able to feed himself.  He is reportedly continent of bowel and  bladder at the present time.   Patient attends outpatient PT and OT three times per week at [facility].  He walks with a single-point cane outside the home and  occasionally inside the home but generally goes without any assistive device  inside the home.  He does  not report any falls.   He is taking Coumadin 5 mg once daily and has that followed by Dr. Audria Nine  at [facility].   MEDICATIONS:  1.  Coumadin 5 mg once daily.  2.  Enteric-coated aspirin 81 mg once daily.  3.  Lipitor 10 mg two tablets once daily.  4.  Multivitamin once daily.  5.  Senokot p.r.n.   REVIEW OF SYSTEMS:  Positive for weight loss, constipation, poor appetite  and limb swelling along with left shoulder pain.   PHYSICAL EXAMINATION:  Reasonably well-appearing adult male seated in a  regular chair.  Blood pressure was 130/70 with pulse of 90, respiratory rate  18 and O2 saturation 99% on room air.  He is pleasant and cooperative and  able to communicate well.  Right upper arm and leg strength was 5/5  throughout.  Bulk and tone were normal and reflexes were 2+ and symmetrical.  Left upper extremity strength showed proximal strength at 3-/5 and distal  strength 4-/5.  Left leg strength was 4/5 in hip flexion and knee extension.  He does use an ankle dorsiflexion brace on his left lower extremity  secondary to weakness.  He ambulates with a single-point cane in his right  upper extremity.   The patient has decreased range of motion of his left shoulder with  complaints of pain.  He does have Kinesio tape present  on his left shoulder  region.   IMPRESSION:  1.  Status post right middle cerebral artery infarction.  2.  Pulmonary embolism.  3.  Dyslipidemia.  4.  History of cluster headaches.   In the office today, no refill on medication is necessary.  He will continue  in outpatient therapies.  We did complete paperwork for food stamps for him  as he is not employable at the present time and probably will be  unemployable for at least 6 months' time.  That should be evaluated in 6  months.  We will plan on seeing the patient in followup in this office in  approximately 2 months' time.      DC/MedQ  D:  11/26/2004 14:45:20  T:  11/27/2004 20:18:05  Job #:   [id number]

## 2010-11-12 NOTE — Assessment & Plan Note (Signed)
[name] returns to the clinic today for follow-up evaluation.  He  attends outpatient PT and OT at [facility] three times per week for the  next 2 weeks.  He is independent with bathing and dressing, but does have an  aide into his home three times per week for 3 hours at a time and that aide  does chores along with meal preparation.  The patient ambulates with a  single point cane in his right lower extremity.  He continues to have his  Coumadin monitored by [facility].  He is doing well overall but continues  to use an ankle foot arthrosis on the left lower extremity in addition to  his cane.   MEDICATIONS:  1.  Coumadin 5 mg daily.  2.  Enteric-coated aspirin 81 mg daily.  3.  Lipitor 10 mg two tablets daily.  4.  Multivitamin daily.  5.  Senokot p.r.n.  6.  Magnesium citrate p.r.n.   REVIEW OF SYSTEMS:  Positive for constipation and easy bleeding.   PHYSICAL EXAMINATION:  GENERAL:  Reasonably well-appearing middle-aged adult  male in no acute discomfort.  VITAL SIGNS:  Blood pressure 115/78 with a pulse of 82, respiratory rate 16,  and O2 saturation 98% on room air.  NEUROLOGY:  He has 5/5 strength throughout the right arm and leg.  Left  upper extremity strength was 4+/5 proximally and 4/5 in grip.  He has  decreased coordinated movements of his left hand compared to his right.  Left lower extremity strength was generally 4/5 in hip flexion and knee  extension.  Ankle dorsiflexion was 0 to 1/5.  The patient ambulates with a  single point cane in his right upper extremity.   IMPRESSION:  1.  Status post right middle cerebral artery infarction.  2.  Pulmonary embolism.  3.  Dyslipidemia.  4.  History of cluster headaches.   In the office today, no refill on medications is necessary.  He will be  continuing in his outpatient therapies and he is making good progress.  Will  plan on seeing him in follow-up in approximately 2 to 3 months time.     ______________________________  Ellwood Dense, M.D.     DC/MedQ  D:  02/11/2005 10:34:28  T:  02/11/2005 11:05:23  Job #:  [id number]

## 2011-05-26 ENCOUNTER — Encounter: Payer: Medicare HMO | Attending: Physical Medicine & Rehabilitation

## 2011-05-26 ENCOUNTER — Encounter: Payer: Medicare HMO | Admitting: Neurosurgery

## 2011-05-26 ENCOUNTER — Ambulatory Visit (HOSPITAL_BASED_OUTPATIENT_CLINIC_OR_DEPARTMENT_OTHER): Payer: Medicare HMO | Admitting: Physical Medicine & Rehabilitation

## 2011-05-26 DIAGNOSIS — I69959 Hemiplegia and hemiparesis following unspecified cerebrovascular disease affecting unspecified side: Secondary | ICD-10-CM | POA: Insufficient documentation

## 2011-05-26 DIAGNOSIS — G8110 Spastic hemiplegia affecting unspecified side: Secondary | ICD-10-CM

## 2011-05-26 NOTE — Assessment & Plan Note (Signed)
REASON FOR VISIT:  Evaluate for accommodation in college setting.  HISTORY:  A 55 year old male with history of right CVA causing left hemiparesis and spasticity.  He was last seen by me in March 2011.  He had previously undergone phenol tibial nerve blocks for spasticity on May 26, 2009 as well as September 07, 2008.  He had previously undergone botulinum toxin injection on February 28, 2008 and April 28, 2009 to his left upper extremity.  He has had no further stroke since I last saw him.  MEDICATIONS: 1. Coumadin 5 mg a day. 2. Lipitor 10 mg a day. 3. Aspirin 81 mg a day. 4. Multivitamin 1 per day. 5. Vitamin D3 1000 units per day.  ALLERGIES:  None known.  PHYSICAL EXAMINATION:  GENERAL:  No acute distress.  Mood and affect appropriate.  His speech has no evidence of dysarthria. MUSCULOSKELETAL:  His motor strength is essentially 1 in the right finger flexors, extensors as well as biceps, triceps.  He has increased tone in the wrist and finger flexors as well as elbow flexors in the left upper extremity.  His sensation is reduced.  In left lower extremity, he has left foot drop.  His strength in the hip flexor, knee extensor are 4-/5.  Gait, he uses a left AFO.  He has increased gait base of support and using a cane.  IMPRESSION: 1. Chronic left spastic hemiparesis.  He has no functional use of his     left upper extremity.  I think for this reason, he will require a     longer time to type in his responses in college on tests, quit     this, and examinations.  I think he would benefit from extended     time to take these examinations. 2. I talked about his spasticity management he sees relatively     satisfied at the current time.  Should he wish to have additional     injections for his upper extremity spasticity he can call this     office and schedule and this may be followed up with some     outpatient OT.  Discussed with the patient and agrees with  plan.     Erick Colace, M.D. Electronically Signed    AEK/MedQ D:  05/26/2011 15:12:17  T:  05/26/2011 21:29:53  Job #:  [id number]

## 2011-05-31 ENCOUNTER — Ambulatory Visit: Payer: Medicare HMO | Admitting: Physical Medicine & Rehabilitation

## 2011-12-23 ENCOUNTER — Encounter: Payer: Self-pay | Admitting: Physical Medicine & Rehabilitation

## 2011-12-23 ENCOUNTER — Ambulatory Visit (HOSPITAL_BASED_OUTPATIENT_CLINIC_OR_DEPARTMENT_OTHER): Payer: Medicare HMO | Admitting: Physical Medicine & Rehabilitation

## 2011-12-23 ENCOUNTER — Encounter: Payer: Medicare HMO | Attending: Physical Medicine & Rehabilitation

## 2011-12-23 VITALS — BP 108/67 | HR 76 | Resp 16 | Ht 71.0 in | Wt 217.0 lb

## 2011-12-23 DIAGNOSIS — M25569 Pain in unspecified knee: Secondary | ICD-10-CM | POA: Insufficient documentation

## 2011-12-23 DIAGNOSIS — R269 Unspecified abnormalities of gait and mobility: Secondary | ICD-10-CM | POA: Insufficient documentation

## 2011-12-23 DIAGNOSIS — I69959 Hemiplegia and hemiparesis following unspecified cerebrovascular disease affecting unspecified side: Secondary | ICD-10-CM | POA: Insufficient documentation

## 2011-12-23 DIAGNOSIS — M25562 Pain in left knee: Secondary | ICD-10-CM

## 2011-12-23 DIAGNOSIS — M216X9 Other acquired deformities of unspecified foot: Secondary | ICD-10-CM | POA: Insufficient documentation

## 2011-12-23 DIAGNOSIS — M238X9 Other internal derangements of unspecified knee: Secondary | ICD-10-CM | POA: Insufficient documentation

## 2011-12-23 NOTE — Patient Instructions (Addendum)
I have ordered x-rays of the knees to be done at [facility]. I want these done before you come back to see me I have given you a urine prescription to see the orthotist to look at bracing options for the left knee and ankle area. Continue Tylenol for pain. Ice left knee as needed for pain

## 2011-12-23 NOTE — Progress Notes (Signed)
Subjective:    Patient ID: Alexander English, male    DOB: 1955-12-04, 56 y.o.   MRN: [id number]  HPI HISTORY: A 56 year old male with history of right CVA causing left  hemiparesis and spasticity. He was last seen by me in March 2011. He  had previously undergone phenol tibial nerve blocks for spasticity on  May 26, 2009 as well as September 07, 2008. He had previously  undergone botulinum toxin injection on February 28, 2008 and April 28, 2009 to his left upper extremity. He has had no further stroke since I  last saw him. Left knee pain with amb up to L thigh.  Started after walking a long distance, AFO is 57 yrs old. Extensor spasm at night but able to flex knee better at night Going to school at [employer]. Needs some forms completed. Pain Inventory Average Pain 10 Pain Right Now 6 My pain is sharp and stabbing  In the last 24 hours, has pain interfered with the following? General activity 5 Relation with others 0 Enjoyment of life 5 What TIME of day is your pain at its worst? Morning and Night Sleep (in general) Fair  Pain is worse with: walking, inactivity and standing Pain improves with: pacing activities Relief from Meds: 4  Mobility use a cane how many minutes can you walk? 30 ability to climb steps?  yes do you drive?  yes transfers alone  Function disabled: date disabled 08/08/2004 retired  Neuro/Psych trouble walking  Prior Studies Any changes since last visit?  no  Physicians involved in your care Any changes since last visit?  no   Family History  Problem Relation Age of Onset  . Stroke Maternal Uncle   . Stroke Maternal Grandmother    History   Social History  . Marital Status: Divorced    Spouse Name: N/A    Number of Children: N/A  . Years of Education: N/A   Social History Main Topics  . Smoking status: Former Games developer  . Smokeless tobacco: None  . Alcohol Use: None  . Drug Use: None  . Sexually Active: None   Other Topics  Concern  . None   Social History Narrative  . None   Past Surgical History  Procedure Date  . Foot surgery    Past Medical History  Diagnosis Date  . Hyperlipidemia   . Stroke    BP 108/67  Pulse 76  Resp 16  Ht 5\' 11"  (1.803 m)  Wt 217 lb (98.431 kg)  BMI 30.27 kg/m2  SpO2 97%   Review of Systems  Constitutional: Negative.   HENT: Negative.   Eyes: Negative.   Respiratory: Negative.   Cardiovascular: Negative.   Gastrointestinal: Negative.   Genitourinary: Negative.   Musculoskeletal: Positive for gait problem.  Skin: Positive for rash.  Neurological: Negative.   Hematological: Bruises/bleeds easily.  Psychiatric/Behavioral: Negative.        Objective:   Physical Exam  Constitutional: He is oriented to person, place, and time.  Musculoskeletal:       Left hip: Normal.       Left knee: He exhibits decreased range of motion, deformity and abnormal alignment. He exhibits no swelling, no effusion, no ecchymosis, no LCL laxity, normal patellar mobility and no MCL laxity. no tenderness found.       Left ankle: He exhibits decreased range of motion and deformity. Achilles tendon normal.       A left dropfoot Left knee with hyperextension during ambulation. This  results in posterior knee pain. Motor strength is 3 at the knee extensor 0 at the ankle dorsiflexor plantar flexor Left deltoid is 3 minus bicep 3 minus tricep 0 finger flexors and finger extensors 1/5  Neurological: He is alert and oriented to person, place, and time. He displays atrophy. He exhibits abnormal muscle tone. Coordination and gait abnormal.       Increased flexor tone in the left upper extremity Increased extensor tone on the left lower ext       Assessment & Plan:  1. Left spastic hemiplegia due to to CVA, chronic 2. Left knee pain due to chronic hyperextension during ambulation. Likely stretching the posterior capsule however given his age and chronic hemiparesis may be at risk for early  arthritis. Will check x-ray. We'll get orthotic reevaluation. See whether we can control hyperextension at the knee by increasing dorsiflexion at the ankle. May need to extend to a KAFO

## 2011-12-26 ENCOUNTER — Ambulatory Visit (HOSPITAL_COMMUNITY)
Admission: RE | Admit: 2011-12-26 | Discharge: 2011-12-26 | Disposition: A | Discharge status: Home / Self Care | Payer: Medicare HMO | Source: Ambulatory Visit | Attending: Physical Medicine & Rehabilitation | Admitting: Physical Medicine & Rehabilitation

## 2011-12-26 ENCOUNTER — Telehealth: Payer: Self-pay | Admitting: *Deleted

## 2011-12-26 DIAGNOSIS — M25562 Pain in left knee: Secondary | ICD-10-CM

## 2011-12-26 DIAGNOSIS — M25569 Pain in unspecified knee: Secondary | ICD-10-CM

## 2011-12-26 NOTE — Telephone Encounter (Signed)
Radiology called for order clarification. Needs to be L 2 view and R 2 view with "standing in comments"

## 2012-01-20 ENCOUNTER — Ambulatory Visit: Payer: Medicare HMO | Admitting: Physical Medicine & Rehabilitation

## 2012-01-26 ENCOUNTER — Ambulatory Visit (HOSPITAL_BASED_OUTPATIENT_CLINIC_OR_DEPARTMENT_OTHER): Payer: Medicare HMO | Admitting: Physical Medicine & Rehabilitation

## 2012-01-26 ENCOUNTER — Encounter: Payer: Self-pay | Admitting: Physical Medicine & Rehabilitation

## 2012-01-26 ENCOUNTER — Encounter: Payer: Medicare HMO | Attending: Physical Medicine & Rehabilitation

## 2012-01-26 VITALS — BP 120/69 | HR 75 | Resp 14 | Ht 71.0 in | Wt 220.0 lb

## 2012-01-26 DIAGNOSIS — IMO0002 Reserved for concepts with insufficient information to code with codable children: Secondary | ICD-10-CM

## 2012-01-26 DIAGNOSIS — M170 Bilateral primary osteoarthritis of knee: Secondary | ICD-10-CM

## 2012-01-26 DIAGNOSIS — M25569 Pain in unspecified knee: Secondary | ICD-10-CM | POA: Insufficient documentation

## 2012-01-26 DIAGNOSIS — G8110 Spastic hemiplegia affecting unspecified side: Secondary | ICD-10-CM

## 2012-01-26 DIAGNOSIS — M1710 Unilateral primary osteoarthritis, unspecified knee: Secondary | ICD-10-CM

## 2012-01-26 DIAGNOSIS — I69959 Hemiplegia and hemiparesis following unspecified cerebrovascular disease affecting unspecified side: Secondary | ICD-10-CM | POA: Insufficient documentation

## 2012-01-26 DIAGNOSIS — R269 Unspecified abnormalities of gait and mobility: Secondary | ICD-10-CM | POA: Insufficient documentation

## 2012-01-26 DIAGNOSIS — M238X9 Other internal derangements of unspecified knee: Secondary | ICD-10-CM | POA: Insufficient documentation

## 2012-01-26 DIAGNOSIS — M179 Osteoarthritis of knee, unspecified: Secondary | ICD-10-CM

## 2012-01-26 DIAGNOSIS — M216X9 Other acquired deformities of unspecified foot: Secondary | ICD-10-CM | POA: Insufficient documentation

## 2012-01-26 HISTORY — DX: Bilateral primary osteoarthritis of knee: M17.0

## 2012-01-26 MED ORDER — DICLOFENAC SODIUM 1 % TD GEL: 1.0000 "application " | Freq: Four times a day (QID) | TRANSDERMAL | Status: DC

## 2012-01-26 NOTE — Patient Instructions (Addendum)
Rub medicine on knees 4 times a day

## 2012-01-26 NOTE — Progress Notes (Signed)
  Subjective:    Patient ID: Alexander English, male    DOB: [birth year], 56 y.o.   MRN: [id number]  HPI A 56 year old male with history of right CVA causing left  hemiparesis and spasticity. He was last seen by me in March 2011. He  had previously undergone phenol tibial nerve blocks for spasticity on  May 26, 2009 as well as September 07, 2008. He had previously  undergone botulinum toxin injection on February 28, 2008 and April 28, 2009 to his left upper extremity. He has had no further stroke since I  last saw him.  Left knee pain with amb up to L thigh. Started after walking a long distance, AFO is 57 yrs old. Knee xray mild DJD joint space narrowing on R, L side quad insertion irregularity at patella Pain Inventory Average Pain 5 Pain Right Now 5 My pain is sharp and stabbing  In the last 24 hours, has pain interfered with the following? General activity 5 Relation with others 5 Enjoyment of life 5 What TIME of day is your pain at its worst? morning Sleep (in general) Fair  Pain is worse with: walking and some activites Pain improves with: rest and pacing activities Relief from Meds: 5  Mobility use a cane how many minutes can you walk? 30 ability to climb steps?  yes do you drive?  yes transfers alone  Function disabled: date disabled 07/2004 retired I need assistance with the following:  household duties  Neuro/Psych trouble walking dizziness  Prior Studies Any changes since last visit?  no  Physicians involved in your care Any changes since last visit?  no   Family History  Problem Relation Age of Onset  . Stroke Maternal Uncle   . Stroke Maternal Grandmother    History   Social History  . Marital Status: Divorced    Spouse Name: N/A    Number of Children: N/A  . Years of Education: N/A   Social History Main Topics  . Smoking status: Former Games developer  . Smokeless tobacco: None  . Alcohol Use: None  . Drug Use: None  . Sexually Active: None    Other Topics Concern  . None   Social History Narrative  . None   Past Surgical History  Procedure Date  . Foot surgery    Past Medical History  Diagnosis Date  . Hyperlipidemia   . Stroke    BP 120/69  Pulse 75  Resp 14  Ht 5\' 11"  (1.803 m)  Wt 220 lb (99.791 kg)  BMI 30.68 kg/m2  SpO2 98%     Review of Systems  Musculoskeletal: Positive for myalgias, arthralgias and gait problem.  Neurological: Positive for dizziness.  All other systems reviewed and are negative.       Objective:   Physical Exam  Musculoskeletal:       Right knee: Normal.       Left knee: Normal.  Neurological: He exhibits abnormal muscle tone.       L quad 3-/5 Ankle L 2-/5 Increased ext tone on Left Lower          Assessment & Plan:  1.  Chronic L hemiparesis due to CVA 2.  Knee pain mild DJD on Both sides mainly at patellar tendon insertion sites Rx voltaren gel

## 2012-03-05 ENCOUNTER — Ambulatory Visit (INDEPENDENT_AMBULATORY_CARE_PROVIDER_SITE_OTHER): Payer: Medicare HMO | Admitting: Pharmacist

## 2012-03-05 DIAGNOSIS — Z7901 Long term (current) use of anticoagulants: Secondary | ICD-10-CM

## 2012-03-05 DIAGNOSIS — I6990 Unspecified sequelae of unspecified cerebrovascular disease: Secondary | ICD-10-CM

## 2012-03-05 DIAGNOSIS — D6859 Other primary thrombophilia: Secondary | ICD-10-CM

## 2012-03-05 LAB — POCT INR: INR: 2.8

## 2012-03-05 NOTE — Patient Instructions (Signed)
Patient instructed to take medications as defined in the Anti-coagulation Track section of this encounter.  Patient instructed to take today's dose.  Patient verbalized understanding of these instructions.    

## 2012-03-05 NOTE — Progress Notes (Signed)
Anti-Coagulation Progress Note  Alexander English is a 56 y.o. male who is currently on an anti-coagulation regimen.    RECENT RESULTS: Recent results are below, the most recent result is correlated with a dose of 35 mg. per week: Lab Results  Component Value Date   INR 2.80 03/05/2012   INR 2.44* 04/29/2010   INR 2.63* 04/08/2010    ANTI-COAG DOSE:   Latest dosing instructions   Total Sun Mon Tue Wed Thu Fri Sat   35 5 mg 5 mg 5 mg 5 mg 5 mg 5 mg 5 mg    (5 mg1) (5 mg1) (5 mg1) (5 mg1) (5 mg1) (5 mg1) (5 mg1)         ANTICOAG SUMMARY: Anticoagulation Episode Summary              Current INR goal 2.0-3.0 Next INR check 03/19/2012   INR from last check 2.80 (03/05/2012)     Weekly max dose (mg)  Target end date Indefinite   Indications HYPERCOAGULABLE STATE, PRIMARY, CEREBROVASCULAR DISEASE LATE EFFECTS, NOS, Encounter for long-term (current) use of anticoagulants   [facility] check location [facility]    Send INR reminders to    Comments             ANTICOAG TODAY: Anticoagulation Summary as of 03/05/2012              INR goal 2.0-3.0     Selected INR 2.80 (03/05/2012) Next INR check 03/19/2012   Weekly max dose (mg)  Target end date Indefinite   Indications HYPERCOAGULABLE STATE, PRIMARY, CEREBROVASCULAR DISEASE LATE EFFECTS, NOS, Encounter for long-term (current) use of anticoagulants    Anticoagulation Episode Summary              INR check location [facility]erred lab    Send INR reminders to    Comments             PATIENT INSTRUCTIONS: Patient Instructions  Patient instructed to take medications as defined in the Anti-coagulation Track section of this encounter.  Patient instructed to take today's dose.  Patient verbalized understanding of these instructions.        FOLLOW-UP Return in 2 weeks (on 03/19/2012) for Follow up INR at 1030h.  Hulen Luster, III Pharm.D., CACP

## 2012-03-19 ENCOUNTER — Ambulatory Visit (INDEPENDENT_AMBULATORY_CARE_PROVIDER_SITE_OTHER): Payer: Medicare HMO | Admitting: Pharmacist

## 2012-03-19 DIAGNOSIS — I6990 Unspecified sequelae of unspecified cerebrovascular disease: Secondary | ICD-10-CM

## 2012-03-19 DIAGNOSIS — D6859 Other primary thrombophilia: Secondary | ICD-10-CM

## 2012-03-19 DIAGNOSIS — Z7901 Long term (current) use of anticoagulants: Secondary | ICD-10-CM

## 2012-03-19 LAB — POCT INR: INR: 2.2

## 2012-03-19 NOTE — Progress Notes (Signed)
Anti-Coagulation Progress Note  Alexander English is a 56 y.o. male who is currently on an anti-coagulation regimen.    RECENT RESULTS: Recent results are below, the most recent result is correlated with a dose of 35 mg. per week: Lab Results  Component Value Date   INR 2.20 03/19/2012   INR 2.80 03/05/2012   INR 2.44* 04/29/2010    ANTI-COAG DOSE:   Latest dosing instructions   Total Sun Mon Tue Wed Thu Fri Sat   37.5 5 mg 5 mg 5 mg 7.5 mg 5 mg 5 mg 5 mg    (5 mg1) (5 mg1) (5 mg1) (5 mg1.5) (5 mg1) (5 mg1) (5 mg1)         ANTICOAG SUMMARY: Anticoagulation Episode Summary              Current INR goal 2.0-3.0 Next INR check 04/02/2012   INR from last check 2.20 (03/19/2012)     Weekly max dose (mg)  Target end date Indefinite   Indications HYPERCOAGULABLE STATE, PRIMARY, CEREBROVASCULAR DISEASE LATE EFFECTS, NOS, Encounter for long-term (current) use of anticoagulants   IN[facility] check location [facility]erred lab    Send INR reminders to    Comments             ANTICOAG TODAY: Anticoagulation Summary as of 03/19/2012              INR goal 2.0-3.0     Selected INR 2.20 (03/19/2012) Next INR check 04/02/2012   Weekly max dose (mg)  Target end date Indefinite   Indications HYPERCOAGULABLE STATE, PRIMARY, CEREBROVASCULAR DISEASE LATE EFFECTS, NOS, Encounter for long-term (current) use of anticoagulants    Anticoagulation Episode Summary              INR check location [facility]erred lab    Send INR reminders to    Comments             PATIENT INSTRUCTIONS: Patient Instructions  Patient instructed to take medications as defined in the Anti-coagulation Track section of this encounter.  Patient instructed to take today's dose.  Patient verbalized understanding of these instructions.        FOLLOW-UP Return in 2 weeks (on 04/02/2012) for Follow up INR at 1100h.  Hulen Luster, III Pharm.D., CACP

## 2012-03-19 NOTE — Patient Instructions (Signed)
Patient instructed to take medications as defined in the Anti-coagulation Track section of this encounter.  Patient instructed to take today's dose.  Patient verbalized understanding of these instructions.    

## 2012-03-19 NOTE — Progress Notes (Signed)
Agree with plan 

## 2012-03-21 ENCOUNTER — Encounter: Payer: Self-pay | Admitting: Internal Medicine

## 2012-03-21 ENCOUNTER — Ambulatory Visit (INDEPENDENT_AMBULATORY_CARE_PROVIDER_SITE_OTHER): Payer: Medicare HMO | Admitting: Internal Medicine

## 2012-03-21 VITALS — BP 115/73 | HR 66 | Temp 98.0°F | Ht 72.0 in | Wt 219.0 lb

## 2012-03-21 DIAGNOSIS — Z0000 Encounter for general adult medical examination without abnormal findings: Secondary | ICD-10-CM | POA: Insufficient documentation

## 2012-03-21 DIAGNOSIS — I6340 Cerebral infarction due to embolism of unspecified cerebral artery: Secondary | ICD-10-CM

## 2012-03-21 DIAGNOSIS — I2699 Other pulmonary embolism without acute cor pulmonale: Secondary | ICD-10-CM

## 2012-03-21 DIAGNOSIS — I63411 Cerebral infarction due to embolism of right middle cerebral artery: Secondary | ICD-10-CM

## 2012-03-21 DIAGNOSIS — Z7901 Long term (current) use of anticoagulants: Secondary | ICD-10-CM

## 2012-03-21 DIAGNOSIS — E7800 Pure hypercholesterolemia, unspecified: Secondary | ICD-10-CM

## 2012-03-21 MED ORDER — ATORVASTATIN CALCIUM 10 MG PO TABS: 10.0000 mg | ORAL_TABLET | Freq: Every day | ORAL | Status: DC

## 2012-03-21 MED ORDER — WARFARIN SODIUM 5 MG PO TABS: 5.0000 mg | ORAL_TABLET | Freq: Every day | ORAL | Status: DC

## 2012-03-21 NOTE — Progress Notes (Signed)
Patient ID: Alexander English, male   DOB: May 19, 1956, 56 y.o.   MRN: [id number]  Subjective:   [name]   DOB: 1956-04-26 56 y.o.   MRN: [id number]  HPI: [name] is a 56 y.o. with past medical history of CVA of the right middle cerebral artery in 2006 and a history of a large pulmonary embolism in the right lung. He comes in to establish care today at this clinic. He does not have any complaints. He is chronically taking warfarin and closely following his INR levels. He has been evaluated in the past with echocardiograms, and coagulation profiles, and he has been [facility], [facility]. Otherwise, is in good general condition except for weakness on the left side of his body.    Past Medical History  Diagnosis Date  . Hyperlipidemia   . Stroke    Current Outpatient Prescriptions  Medication Sig Dispense Refill  . aspirin 81 MG tablet Take 81 mg by mouth daily.      Marland Kitchen atorvastatin (LIPITOR) 10 MG tablet Take 1 tablet (10 mg total) by mouth daily.  30 tablet  2  . calcium-vitamin D (OSCAL WITH D) 500-200 MG-UNIT per tablet Take 1 tablet by mouth daily.      . diclofenac sodium (VOLTAREN) 1 % GEL Apply 1 application topically 4 (four) times daily.  5 Tube  5  . Multiple Vitamin (MULTIVITAMIN) tablet Take 1 tablet by mouth daily.      Marland Kitchen warfarin (COUMADIN) 5 MG tablet Take 1 tablet (5 mg total) by mouth daily.  30 tablet  1  . DISCONTD: atorvastatin (LIPITOR) 10 MG tablet Take 10 mg by mouth daily.      Marland Kitchen DISCONTD: warfarin (COUMADIN) 5 MG tablet Take 5 mg by mouth daily.       Family History  Problem Relation Age of Onset  . Stroke Maternal Uncle   . Stroke Maternal Grandmother    History   Social History  . Marital Status: Divorced    Spouse Name: N/A    Number of Children: N/A  . Years of Education: N/A   Social History Main Topics  . Smoking status: Former Games developer  . Smokeless tobacco: None  . Alcohol  Use: None  . Drug Use: None  . Sexually Active: None   Other Topics Concern  . None   Social History Narrative  . None   Review of Systems: Review of systems is negative Objective:  Physical Exam: Filed Vitals:   03/21/12 1542  BP: 115/73  Pulse: 66  Temp: 98 F (36.7 C)  TempSrc: Oral  Height: 6' (1.829 m)  Weight: 219 lb (99.338 kg)  SpO2: 98%   Physical Exam  Vitals reviewed. Constitutional: He is oriented to person, place, and time. He appears well-developed and well-nourished.  HENT:  Nose: Nose normal.  Eyes: Pupils are equal, round, and reactive to light.  Cardiovascular: Normal rate, regular rhythm and intact distal pulses.  Exam reveals no gallop.   No murmur heard. Pulmonary/Chest: Effort normal.  Abdominal: Soft.  Musculoskeletal: Normal range of motion.  Neurological: He is alert and oriented to person, place, and time. He exhibits abnormal muscle tone.  Reflex Scores:      Tricep reflexes are 4+ on the right side and 2+ on the left side.      Bicep reflexes are 4+ on the right side and 2+ on the left side.  Brachioradialis reflexes are 4+ on the right side and 2+ on the left side.      Patellar reflexes are 4+ on the right side and 2+ on the left side.      Achilles reflexes are 4+ on the right side and 2+ on the left side.  Assessment & Plan:  Patient has been discussed with Dr. Rogelia Boga. Assessment and plan is as detailed, and at each problem.

## 2012-03-21 NOTE — Assessment & Plan Note (Signed)
Patient reports that he had a colonoscopy around 3 years ago done by [facility] gastroenterologist. I will have to contact you go for his records.

## 2012-03-21 NOTE — Assessment & Plan Note (Signed)
He will continue with Lipitor at 10 mg once daily. I would need to look into his previous records from [facility] to determine whether this dose is adequate and also check whether he has had a recent lipid panel.

## 2012-03-21 NOTE — Assessment & Plan Note (Signed)
Patient reports history of a stroke, and at around the same time in 2006. He also was found to have a right pulmonary large embolus. Since then, the patient has been on Coumadin, which he wishes to continue with. Screening. Coagulopathies was negative, including protein S, antithrombin 3, Laden factor V. His questions about Coumadin therapy, have been answered. We have also told him that will need close followup for his INR, which he has been doing. Once I review all his records from his previous practice will be able advise him more fully. In the meantime of scheduled his appointment in 2 months.

## 2012-03-21 NOTE — Patient Instructions (Addendum)
Please follow up in 2 months. 

## 2012-03-22 ENCOUNTER — Encounter: Payer: Self-pay | Admitting: Internal Medicine

## 2012-03-22 NOTE — Progress Notes (Signed)
I saw, examined, and discussed the patient with Dr Zada Girt and agree with the note contained here. Dr Zada Girt and I discussed warfarin indication with Alexander English and he prefers lifelong treatment as he fears another CVA more than warfarin complications. PMHX and PL updated.

## 2012-04-02 ENCOUNTER — Ambulatory Visit: Payer: Medicare HMO

## 2012-04-09 ENCOUNTER — Ambulatory Visit (INDEPENDENT_AMBULATORY_CARE_PROVIDER_SITE_OTHER): Payer: Medicare HMO | Admitting: Pharmacist

## 2012-04-09 DIAGNOSIS — D6859 Other primary thrombophilia: Secondary | ICD-10-CM

## 2012-04-09 DIAGNOSIS — I6990 Unspecified sequelae of unspecified cerebrovascular disease: Secondary | ICD-10-CM

## 2012-04-09 DIAGNOSIS — Z7901 Long term (current) use of anticoagulants: Secondary | ICD-10-CM

## 2012-04-09 LAB — POCT INR: INR: 2.3

## 2012-04-09 NOTE — Progress Notes (Signed)
Agree with Dr. Groce's Assessment/plan for this patient.  

## 2012-04-09 NOTE — Progress Notes (Signed)
Anti-Coagulation Progress Note  Alexander English is a 56 y.o. male who is currently on an anti-coagulation regimen.    RECENT RESULTS: Recent results are below, the most recent result is correlated with a dose of 37.5 mg. per week: Lab Results  Component Value Date   INR 2.30 04/09/2012   INR 2.20 03/19/2012   INR 2.80 03/05/2012    ANTI-COAG DOSE:   Latest dosing instructions   Total Sun Mon Tue Wed Thu Fri Sat   42.5 5 mg 7.5 mg 5 mg 7.5 mg 5 mg 7.5 mg 5 mg    (5 mg1) (5 mg1.5) (5 mg1) (5 mg1.5) (5 mg1) (5 mg1.5) (5 mg1)         ANTICOAG SUMMARY: Anticoagulation Episode Summary              Current INR goal 2.0-3.0 Next INR check 05/07/2012   INR from last check 2.30 (04/09/2012)     Weekly max dose (mg)  Target end date Indefinite   Indications HYPERCOAGULABLE STATE, PRIMARY, CEREBROVASCULAR DISEASE LATE EFFECTS, NOS, Long term (current) use of anticoagulants   [facility] check location [facility]    Send INR reminders to    Comments             ANTICOAG TODAY: Anticoagulation Summary as of 04/09/2012              INR goal 2.0-3.0     Selected INR 2.30 (04/09/2012) Next INR check 05/07/2012   Weekly max dose (mg)  Target end date Indefinite   Indications HYPERCOAGULABLE STATE, PRIMARY, CEREBROVASCULAR DISEASE LATE EFFECTS, NOS, Long term (current) use of anticoagulants    Anticoagulation Episode Summary              INR check location [facility] Preferred lab    Send INR reminders to    Comments             PATIENT INSTRUCTIONS: Patient Instructions  Patient instructed to take medications as defined in the Anti-coagulation Track section of this encounter.  Patient instructed to take today's dose.  Patient verbalized understanding of these instructions.        FOLLOW-UP Return in 4 weeks (on 05/07/2012) for Follow up at 1000h.  Hulen Luster, III Pharm.D., CACP

## 2012-04-09 NOTE — Patient Instructions (Signed)
Patient instructed to take medications as defined in the Anti-coagulation Track section of this encounter.  Patient instructed to take today's dose.  Patient verbalized understanding of these instructions.    

## 2012-04-24 ENCOUNTER — Encounter: Payer: Self-pay | Admitting: *Deleted

## 2012-04-24 NOTE — Progress Notes (Unsigned)
Pt presents c/o cold symptoms x 1 week, denies fever, states congestion, yellow/ green mucous from nose, pain at eyebrows more so on L side. Tired. appt cannot be scheduled today due to a class the pt has, appt given w/ med student 10/30 at 0930

## 2012-04-24 NOTE — Progress Notes (Signed)
OK, to be seen in clinic.

## 2012-04-25 ENCOUNTER — Encounter: Payer: Self-pay | Admitting: Internal Medicine

## 2012-04-25 ENCOUNTER — Ambulatory Visit (INDEPENDENT_AMBULATORY_CARE_PROVIDER_SITE_OTHER): Payer: Medicare HMO | Admitting: Internal Medicine

## 2012-04-25 VITALS — BP 110/70 | HR 81 | Temp 98.1°F | Ht 72.0 in | Wt 219.0 lb

## 2012-04-25 DIAGNOSIS — Z7901 Long term (current) use of anticoagulants: Secondary | ICD-10-CM

## 2012-04-25 DIAGNOSIS — J019 Acute sinusitis, unspecified: Secondary | ICD-10-CM

## 2012-04-25 DIAGNOSIS — I639 Cerebral infarction, unspecified: Secondary | ICD-10-CM

## 2012-04-25 LAB — POCT INR: INR: 2.7

## 2012-04-25 MED ORDER — AMOXICILLIN-POT CLAVULANATE 875-125 MG PO TABS: 1.0000 | ORAL_TABLET | Freq: Two times a day (BID) | ORAL | Status: AC

## 2012-04-25 NOTE — Patient Instructions (Addendum)
-  return to see Dr. Alexandria Lodge on Monday for INR check. -take antibiotic for 5 days, twice a day. -keep follow up appointment. -report any bleeding to [place] immediately.

## 2012-04-25 NOTE — Progress Notes (Signed)
[name] is a 56 year old man with a history of ischemic stroke (2006) that presents to the clinic with a cold symptoms of 1 week duration.  S- [name] symptoms started with rhinorrhea, diffuse myalgia one week ago, before progressing to constant headaches, congestion, sinus tenderness, and weakness. He reports a decrease in his appetite, and a cough productive of brown mucous. Vapor rub alleviated symptoms. He has tenderness above his eyebrows, particularly on the left, aggravated by changing position. No fevers, chills. He reports nausea yesterday. This morning, he woke up with a cold sweat, without rigors.  O- VITALS: BP 110/70  Pulse 81  Temp 98.1 F (36.7 C) (Oral)  Ht 6' (1.829 m)  Wt 219 lb (99.338 kg)  BMI 29.70 kg/m2  SpO2 100%  GEN: NAD, resting comfortably HEENT: PERRL, EOMI, anicteric, nl OP, moist MM  NECK: supple, no appreciable cervical or supraclavicular LAD CV: RRR, normal S1S2, no m/r/g, no peripheral edema LUNGS: CTAB no wheezes or crackles ABD: soft, NT/ND +bs EXT: no edema/cyanosis/clubbing PSYCH: AAO x 3, normal affect NEURO: CN III-XII grossly intact, 5/5 strength b/l SKIN: no rashes or lesions MSK: no joint tenderness or effusions   A/P

## 2012-04-25 NOTE — Progress Notes (Signed)
  Subjective:    Patient ID: Alexander English, male    DOB: 10-20-1955, 56 y.o.   MRN: [id number]  HPI Presents today due to a complaint of 1 week history of "cold symptoms".  He states 1 week ago began to have rhinorrhea, myalgias, and headache w/ congestion. He admits to decreased appetite, weakness, and not productive cough with brown mucus.  He states he woke up this morning in a "cold sweat". No chills or rigors. He also admits to sinus tenderness.  He has attempted to use vicks OTC for symptoms which has helped. He feels most of his symptoms are around his face. Denies SOB. He admits he's having pressure around his face that worsens with tilting his head forward. He thinks the brown mucus is coming from his sinuses.  Review of Systems Otherwise negative except for that stated in the HPI.    Objective:   Physical Exam Filed Vitals:   04/25/12 0921  BP: 110/70  Pulse: 81  Temp: 98.1 F (36.7 C)   GEN: AAOx3, NAD. HEENT: EOMI, PERRLA, no adenopathy. +tenderness over maxillary sinuses. CV: S1S2, no m/r/g, RRR PULM:  CTA bilat.       Assessment & Plan:  56 yr. Old male w/ hx CVA, PE on coumadin, presents with acute sinusitis. 1) Acute sinusitis: Augmentin 875/125 mg po bid x 5 days. He must return in the next 5 days to have INR checked. Check INR today. Advised him that abx in general can raise INR and increase risk of bleeding, he understands risks and agrees with treatment. Return to coumadin clinic next Monday. Advised to report bleeding right away to [place].  Jonah Blue

## 2012-04-30 ENCOUNTER — Ambulatory Visit (INDEPENDENT_AMBULATORY_CARE_PROVIDER_SITE_OTHER): Payer: Medicare HMO | Admitting: Pharmacist

## 2012-04-30 ENCOUNTER — Other Ambulatory Visit: Payer: Self-pay | Admitting: *Deleted

## 2012-04-30 DIAGNOSIS — I2699 Other pulmonary embolism without acute cor pulmonale: Secondary | ICD-10-CM

## 2012-04-30 DIAGNOSIS — I63411 Cerebral infarction due to embolism of right middle cerebral artery: Secondary | ICD-10-CM

## 2012-04-30 DIAGNOSIS — I6990 Unspecified sequelae of unspecified cerebrovascular disease: Secondary | ICD-10-CM

## 2012-04-30 DIAGNOSIS — D6859 Other primary thrombophilia: Secondary | ICD-10-CM

## 2012-04-30 DIAGNOSIS — Z7901 Long term (current) use of anticoagulants: Secondary | ICD-10-CM

## 2012-04-30 LAB — POCT INR: INR: 2.5

## 2012-04-30 NOTE — Telephone Encounter (Signed)
Pt informed and will come to clinic on Wed to see his PCP

## 2012-04-30 NOTE — Progress Notes (Signed)
Anti-Coagulation Progress Note  Alexander English is a 56 y.o. male who is currently on an anti-coagulation regimen.    RECENT RESULTS: Recent results are below, the most recent result is correlated with a dose of 42.5  mg. per week: Lab Results  Component Value Date   INR 2.50 04/30/2012   INR 2.7 04/25/2012   INR 2.30 04/09/2012    ANTI-COAG DOSE:   Latest dosing instructions   Total Sun Mon Tue Wed Thu Fri Sat   40 5 mg 7.5 mg 5 mg 5 mg 7.5 mg 5 mg 5 mg    (5 mg1) (5 mg1.5) (5 mg1) (5 mg1) (5 mg1.5) (5 mg1) (5 mg1)         ANTICOAG SUMMARY: Anticoagulation Episode Summary              Current INR goal 2.0-3.0 Next INR check 05/07/2012   INR from last check 2.50 (04/30/2012)     Weekly max dose (mg)  Target end date Indefinite   Indications HYPERCOAGULABLE STATE, PRIMARY [289.81], CEREBROVASCULAR DISEASE LATE EFFECTS, NOS [438.9], Long term (current) use of anticoagulants [V58.61]   INR check location [facility] Preferred lab    Send INR reminders to    Comments             ANTICOAG TODAY: Anticoagulation Summary as of 04/30/2012              INR goal 2.0-3.0     Selected INR 2.50 (04/30/2012) Next INR check 05/07/2012   Weekly max dose (mg)  Target end date Indefinite   Indications HYPERCOAGULABLE STATE, PRIMARY [289.81], CEREBROVASCULAR DISEASE LATE EFFECTS, NOS [438.9], Long term (current) use of anticoagulants [V58.61]    Anticoagulation Episode Summary              INR check location [facility] Preferred lab    Send INR reminders to    Comments             PATIENT INSTRUCTIONS: Patient Instructions  Patient instructed to take medications as defined in the Anti-coagulation Track section of this encounter.  Patient instructed to take today's dose.  Patient verbalized understanding of these instructions.        FOLLOW-UP Return in 7 days (on 05/07/2012) for Follow up INR at 1100h.  Hulen Luster, III Pharm.D., CACP

## 2012-04-30 NOTE — Patient Instructions (Signed)
Patient instructed to take medications as defined in the Anti-coagulation Track section of this encounter.  Patient instructed to take today's dose.  Patient verbalized understanding of these instructions.    

## 2012-04-30 NOTE — Telephone Encounter (Signed)
He needs to follow up as I advised him on his visit with me, this week. He must have his INR checked today as I advised him on our visit.  He needs to see his PCP before I refill his statin medication because I do not see LFT's in the past year.

## 2012-04-30 NOTE — Telephone Encounter (Signed)
Pt in clinic to see Dr Alexandria Lodge and will finish Augmentin today.  He is asking for a refill because he is feeling better but not back to his normal. He c/o runny nose, headache and productive cough.  Denies fever.   Pt # [id number]

## 2012-05-02 ENCOUNTER — Ambulatory Visit: Payer: Medicare HMO | Admitting: Internal Medicine

## 2012-05-07 ENCOUNTER — Ambulatory Visit (INDEPENDENT_AMBULATORY_CARE_PROVIDER_SITE_OTHER): Payer: Medicare HMO | Admitting: Pharmacist

## 2012-05-07 ENCOUNTER — Ambulatory Visit (INDEPENDENT_AMBULATORY_CARE_PROVIDER_SITE_OTHER): Payer: Medicare HMO | Admitting: Internal Medicine

## 2012-05-07 ENCOUNTER — Encounter: Payer: Self-pay | Admitting: Internal Medicine

## 2012-05-07 ENCOUNTER — Ambulatory Visit: Payer: Medicare HMO

## 2012-05-07 VITALS — BP 102/66 | HR 64 | Temp 97.5°F | Ht 72.0 in | Wt 217.9 lb

## 2012-05-07 DIAGNOSIS — Z8673 Personal history of transient ischemic attack (TIA), and cerebral infarction without residual deficits: Secondary | ICD-10-CM

## 2012-05-07 DIAGNOSIS — D6859 Other primary thrombophilia: Secondary | ICD-10-CM

## 2012-05-07 DIAGNOSIS — Z7901 Long term (current) use of anticoagulants: Secondary | ICD-10-CM

## 2012-05-07 DIAGNOSIS — J019 Acute sinusitis, unspecified: Secondary | ICD-10-CM

## 2012-05-07 DIAGNOSIS — Z8669 Personal history of other diseases of the nervous system and sense organs: Secondary | ICD-10-CM

## 2012-05-07 DIAGNOSIS — Z23 Encounter for immunization: Secondary | ICD-10-CM

## 2012-05-07 DIAGNOSIS — I6990 Unspecified sequelae of unspecified cerebrovascular disease: Secondary | ICD-10-CM

## 2012-05-07 LAB — POCT INR: INR: 2.3

## 2012-05-07 NOTE — Progress Notes (Signed)
Patient ID: Alexander English, male   DOB: [birth year], 56 y.o.   MRN: [id number]  Subjective:   [name]   DOB: 1956/05/31 56 y.o.   MRN: [id number]  HPI: Alexander English is a 56 y.o. with a past medical history of a stroke in 2006 and a pulmonary embolism who is on indefinite Coumadin therapy, presents to the clinic today for followup visit of a recently treated acute sinusitis.  He reports that the symptoms of almost completely resolved. His only complaint is just a little bit of mucus that comes from his nose when he leans his head forward. However, he denies any fevers or pain around his face. He denies fevers or chills. His been able to go back to work.     Past Medical History  Diagnosis Date  . Hyperlipidemia     Maintained on statin  . Stroke 2006    Occured after traveling from [place] to [place]. Was hospitalized in [place] but no W/U or tx. Returned to [place] unable to walk and was admitted to [facility] immediately after landing. Presumed embolic per neuro but TEE, bubble study, and hypercoag W/U negative.   . PE (pulmonary embolism) 2006    DX at [facility] after traveling to and from [place]. Indefinite warfarin.    Current Outpatient Prescriptions  Medication Sig Dispense Refill  . aspirin 81 MG tablet Take 81 mg by mouth daily.      Marland Kitchen atorvastatin (LIPITOR) 10 MG tablet Take 1 tablet (10 mg total) by mouth daily.  30 tablet  2  . calcium-vitamin D (OSCAL WITH D) 500-200 MG-UNIT per tablet Take 1 tablet by mouth daily.      . diclofenac sodium (VOLTAREN) 1 % GEL Apply 1 application topically 4 (four) times daily.  5 Tube  5  . Multiple Vitamin (MULTIVITAMIN) tablet Take 1 tablet by mouth daily.      Marland Kitchen warfarin (COUMADIN) 5 MG tablet Take 1 tablet (5 mg total) by mouth daily.  30 tablet  1  . amoxicillin-clavulanate (AUGMENTIN) 875-125 MG per tablet Take 1 tablet by mouth 2 (two) times daily. For a total of five days.  10 tablet  0   Family History  Problem  Relation Age of Onset  . Stroke Maternal Uncle   . Stroke Maternal Grandmother    History   Social History  . Marital Status: Divorced    Spouse Name: N/A    Number of Children: N/A  . Years of Education: N/A   Social History Main Topics  . Smoking status: Former Games developer  . Smokeless tobacco: None  . Alcohol Use: No  . Drug Use: No  . Sexually Active: None   Other Topics Concern  . None   Social History Narrative  . None   Review of Systems: Review of Systems  Constitutional: Negative.   HENT: Negative.   Eyes: Negative.   Respiratory: Negative.   Cardiovascular: Negative.   Gastrointestinal: Negative.   Genitourinary: Negative.   Musculoskeletal: Negative.   Skin: Negative.   Neurological:       He has a long-standing weakness on the left side from a previous stroke. He is able to ambulate using a cane.  Endo/Heme/Allergies: Negative.   Psychiatric/Behavioral: Negative.     Objective:  Physical Exam: Filed Vitals:   05/07/12 1125  BP: 102/66  Pulse: 64  Temp: 97.5 F (36.4 C)  TempSrc: Oral  Height: 6' (1.829 m)  Weight: 217 lb 14.4 oz (  98.839 kg)  SpO2: 99%   Physical Exam  Constitutional: He is oriented to person, place, and time and well-developed, well-nourished, and in no distress. No distress.  HENT:  Head: Normocephalic.  Eyes: EOM are normal.  Neck: Normal range of motion.  Cardiovascular: Normal rate, regular rhythm, normal heart sounds and intact distal pulses.  Exam reveals no friction rub.   No murmur heard. Pulmonary/Chest: Effort normal and breath sounds normal. No respiratory distress. He has no wheezes. He has no rales. He exhibits no tenderness.  Abdominal: Soft. Bowel sounds are normal.  Musculoskeletal: Normal range of motion. He exhibits no edema.  Neurological: He is alert and oriented to person, place, and time.       Left-sided hemiparesis.  Skin: Skin is warm. He is not diaphoretic.  Psychiatric: Affect normal.      Assessment & Plan:  The assessment and plan for the care of this patient has been discussed with Dr. Aundria Rud.   In brief, the patient will continue with his usual medications including aspirin, Lipitor 10 mg once daily, and Coumadin. He has successfully been treated for the acute sinusitis. He will followup with me in 6 months. He has been encouraged to arrange for colonoscopy as soon as possible.

## 2012-05-07 NOTE — Patient Instructions (Signed)
Patient instructed to take medications as defined in the Anti-coagulation Track section of this encounter.  Patient instructed to TAKE today's dose.  Patient verbalized understanding of these instructions.     

## 2012-05-07 NOTE — Progress Notes (Signed)
Anti-Coagulation Progress Note  Alexander English is a 56 y.o. male who is currently on an anti-coagulation regimen.    RECENT RESULTS: Recent results are below, the most recent result is correlated with a dose of 40 mg. per week: Lab Results  Component Value Date   INR 2.30 05/07/2012   INR 2.50 04/30/2012   INR 2.7 04/25/2012    ANTI-COAG DOSE:   Latest dosing instructions   Total Sun Mon Tue Wed Thu Fri Sat   42.5 5 mg 7.5 mg 5 mg 7.5 mg 5 mg 7.5 mg 5 mg    (5 mg1) (5 mg1.5) (5 mg1) (5 mg1.5) (5 mg1) (5 mg1.5) (5 mg1)         ANTICOAG SUMMARY: Anticoagulation Episode Summary              Current INR goal 2.0-3.0 Next INR check 05/28/2012   INR from last check 2.30 (05/07/2012)     Weekly max dose (mg)  Target end date Indefinite   Indications HYPERCOAGULABLE STATE, PRIMARY [289.81], HISTORY OF EMBOLIC STROKE WITH LEFT SIDED HEMIPARESIS [V12.49], Long term (current) use of anticoagulants [V58.61]   INR check location [facility] Preferred lab    Send INR reminders to    Comments             ANTICOAG TODAY: Anticoagulation Summary as of 05/07/2012              INR goal 2.0-3.0     Selected INR 2.30 (05/07/2012) Next INR check 05/28/2012   Weekly max dose (mg)  Target end date Indefinite   Indications HYPERCOAGULABLE STATE, PRIMARY [289.81], HISTORY OF EMBOLIC STROKE WITH LEFT SIDED HEMIPARESIS [V12.49], Long term (current) use of anticoagulants [V58.61]    Anticoagulation Episode Summary              INR check location [facility] Preferred lab    Send INR reminders to    Comments             PATIENT INSTRUCTIONS: Patient Instructions  Patient instructed to take medications as defined in the Anti-coagulation Track section of this encounter.  Patient instructed to TAKE today's dose.  Patient verbalized understanding of these instructions.        FOLLOW-UP Return in about 3 weeks (around 05/28/2012) for Follow up INR at 1045h.  Hulen Luster, III Pharm.D., CACP

## 2012-05-07 NOTE — Patient Instructions (Addendum)
Please arrange for your colonoscopy as soon as you can  Please review the information I have handed to you regarding Screening for Prostate  Please continue with all your medication Please continue following up with Dr Alexandria Lodge for your coumadin Please come back in 6 months

## 2012-05-21 ENCOUNTER — Ambulatory Visit: Payer: Medicare HMO

## 2012-05-28 ENCOUNTER — Ambulatory Visit (INDEPENDENT_AMBULATORY_CARE_PROVIDER_SITE_OTHER): Payer: Medicare HMO | Admitting: Pharmacist

## 2012-05-28 DIAGNOSIS — D6859 Other primary thrombophilia: Secondary | ICD-10-CM

## 2012-05-28 DIAGNOSIS — Z8673 Personal history of transient ischemic attack (TIA), and cerebral infarction without residual deficits: Secondary | ICD-10-CM

## 2012-05-28 DIAGNOSIS — Z7901 Long term (current) use of anticoagulants: Secondary | ICD-10-CM

## 2012-05-28 DIAGNOSIS — I6990 Unspecified sequelae of unspecified cerebrovascular disease: Secondary | ICD-10-CM

## 2012-05-28 LAB — POCT INR: INR: 3.4

## 2012-05-28 NOTE — Patient Instructions (Signed)
Patient instructed to take medications as defined in the Anti-coagulation Track section of this encounter.  Patient instructed to take today's dose.  Patient verbalized understanding of these instructions.    

## 2012-05-28 NOTE — Progress Notes (Signed)
Anti-Coagulation Progress Note  Alexander English is a 56 y.o. male who is currently on an anti-coagulation regimen.    RECENT RESULTS: Recent results are below, the most recent result is correlated with a dose of 45 mg. per week: Lab Results  Component Value Date   INR 3.40 05/28/2012   INR 2.30 05/07/2012   INR 2.50 04/30/2012    ANTI-COAG DOSE:   Latest dosing instructions   Total Sun Mon Tue Wed Thu Fri Sat   40 5 mg 7.5 mg 5 mg 5 mg 7.5 mg 5 mg 5 mg    (5 mg1) (5 mg1.5) (5 mg1) (5 mg1) (5 mg1.5) (5 mg1) (5 mg1)         ANTICOAG SUMMARY: Anticoagulation Episode Summary              Current INR goal 2.0-3.0 Next INR check 06/25/2012   INR from last check 3.40! (05/28/2012)     Weekly max dose (mg)  Target end date Indefinite   Indications HYPERCOAGULABLE STATE, PRIMARY [289.81], HISTORY OF EMBOLIC STROKE WITH LEFT SIDED HEMIPARESIS [V12.49], Long term (current) use of anticoagulants [V58.61]   INR check location [facility] Preferred lab    Send INR reminders to    Comments             ANTICOAG TODAY: Anticoagulation Summary as of 05/28/2012              INR goal 2.0-3.0     Selected INR 3.40! (05/28/2012) Next INR check 06/25/2012   Weekly max dose (mg)  Target end date Indefinite   Indications HYPERCOAGULABLE STATE, PRIMARY [289.81], HISTORY OF EMBOLIC STROKE WITH LEFT SIDED HEMIPARESIS [V12.49], Long term (current) use of anticoagulants [V58.61]    Anticoagulation Episode Summary              INR check location [facility] Preferred lab    Send INR reminders to    Comments             PATIENT INSTRUCTIONS: Patient Instructions  Patient instructed to take medications as defined in the Anti-coagulation Track section of this encounter.  Patient instructed to take today's dose.  Patient verbalized understanding of these instructions.        FOLLOW-UP Return in 4 weeks (on 06/25/2012) for Follow up INR at 1030h.  Hulen Luster,  III Pharm.D., CACP

## 2012-05-28 NOTE — Progress Notes (Signed)
Agree with Dr. Groce's Plan. 

## 2012-06-25 ENCOUNTER — Ambulatory Visit (INDEPENDENT_AMBULATORY_CARE_PROVIDER_SITE_OTHER): Payer: Medicare HMO | Admitting: Pharmacist

## 2012-06-25 DIAGNOSIS — Z8669 Personal history of other diseases of the nervous system and sense organs: Secondary | ICD-10-CM

## 2012-06-25 DIAGNOSIS — Z7901 Long term (current) use of anticoagulants: Secondary | ICD-10-CM

## 2012-06-25 DIAGNOSIS — Z8673 Personal history of transient ischemic attack (TIA), and cerebral infarction without residual deficits: Secondary | ICD-10-CM

## 2012-06-25 DIAGNOSIS — D6859 Other primary thrombophilia: Secondary | ICD-10-CM

## 2012-06-25 DIAGNOSIS — I6990 Unspecified sequelae of unspecified cerebrovascular disease: Secondary | ICD-10-CM

## 2012-06-25 LAB — POCT INR: INR: 3

## 2012-06-25 NOTE — Patient Instructions (Signed)
Patient instructed to take medications as defined in the Anti-coagulation Track section of this encounter.  Patient instructed to take today's dose.  Patient verbalized understanding of these instructions.    

## 2012-06-25 NOTE — Progress Notes (Signed)
Anti-Coagulation Progress Note  Alexander English is a 56 y.o. male who is currently on an anti-coagulation regimen.    RECENT RESULTS: Recent results are below, the most recent result is correlated with a dose of 40 mg. per week: Lab Results  Component Value Date   INR 3.00 06/25/2012   INR 3.40 05/28/2012   INR 2.30 05/07/2012    ANTI-COAG DOSE:   Latest dosing instructions   Total Sun Mon Tue Wed Thu Fri Sat   37.5 5 mg 5 mg 5 mg 7.5 mg 5 mg 5 mg 5 mg    (5 mg1) (5 mg1) (5 mg1) (5 mg1.5) (5 mg1) (5 mg1) (5 mg1)         ANTICOAG SUMMARY: Anticoagulation Episode Summary              Current INR goal 2.0-3.0 Next INR check 07/23/2012   INR from last check 3.00 (06/25/2012)     Weekly max dose (mg)  Target end date Indefinite   Indications HYPERCOAGULABLE STATE, PRIMARY [289.81], HISTORY OF EMBOLIC STROKE WITH LEFT SIDED HEMIPARESIS [V12.49], Long term (current) use of anticoagulants [V58.61]   INR check location [facility] Preferred lab    Send INR reminders to    Comments             ANTICOAG TODAY: Anticoagulation Summary as of 06/25/2012              INR goal 2.0-3.0     Selected INR 3.00 (06/25/2012) Next INR check 07/23/2012   Weekly max dose (mg)  Target end date Indefinite   Indications HYPERCOAGULABLE STATE, PRIMARY [289.81], HISTORY OF EMBOLIC STROKE WITH LEFT SIDED HEMIPARESIS [V12.49], Long term (current) use of anticoagulants [V58.61]    Anticoagulation Episode Summary              INR check location [facility] Preferred lab    Send INR reminders to    Comments             PATIENT INSTRUCTIONS: Patient Instructions  Patient instructed to take medications as defined in the Anti-coagulation Track section of this encounter.  Patient instructed to take today's dose.  Patient verbalized understanding of these instructions.        FOLLOW-UP Return in 4 weeks (on 07/23/2012) for Follow up INR at 0945h.  Hulen Luster, III Pharm.D.,  CACP

## 2012-07-23 ENCOUNTER — Ambulatory Visit (INDEPENDENT_AMBULATORY_CARE_PROVIDER_SITE_OTHER): Payer: Medicare HMO | Admitting: Pharmacist

## 2012-07-23 DIAGNOSIS — I6990 Unspecified sequelae of unspecified cerebrovascular disease: Secondary | ICD-10-CM

## 2012-07-23 DIAGNOSIS — Z8673 Personal history of transient ischemic attack (TIA), and cerebral infarction without residual deficits: Secondary | ICD-10-CM

## 2012-07-23 DIAGNOSIS — Z7901 Long term (current) use of anticoagulants: Secondary | ICD-10-CM

## 2012-07-23 DIAGNOSIS — D6859 Other primary thrombophilia: Secondary | ICD-10-CM

## 2012-07-23 DIAGNOSIS — Z8669 Personal history of other diseases of the nervous system and sense organs: Secondary | ICD-10-CM

## 2012-07-23 LAB — POCT INR: INR: 1.3

## 2012-07-23 NOTE — Patient Instructions (Signed)
Patient instructed to take medications as defined in the Anti-coagulation Track section of this encounter.  Patient instructed to OMIT today's dose and all remaining doses of warfarin until seen by GI Medicine Physician performing colonoscopy this Friday. AFTER colonoscopy, based upon findings of GI Medicine Physician and his advice, resume warfarin at 1x5mg  warfarin tablet by mouth daily. This should resume based upon what your GI Medicine Physician tells you---but most typically will recommence within 24 - 72 hours after colonoscopy. IF you have "biopsys" performed (polpy removal) you will typically NOT resume warfarin for 72 hours (3 days).  Patient verbalized understanding of these instructions.

## 2012-07-23 NOTE — Progress Notes (Signed)
Anti-Coagulation Progress Note  Alexander English is a 57 y.o. male who is currently on an anti-coagulation regimen.    RECENT RESULTS: Recent results are below, the most recent result is correlated with a dose of 0 mg. per week:  His warfarin has been on HOLD pending colonoscopy this Friday  31-JAN-14 at 1:30PM [facility] Dr. Roosvelt Maser per patient. Lab Results  Component Value Date   INR 1.3 07/23/2012   INR 3.00 06/25/2012   INR 3.40 05/28/2012    ANTI-COAG DOSE:   Latest dosing instructions   Total Sun Mon Tue Wed Thu Fri Sat   5 5 mg Hold Hold Hold Hold Hold     (5 mg1) Hold Hold Hold Hold Hold          ANTICOAG SUMMARY: Anticoagulation Episode Summary              Current INR goal 2.0-3.0 Next INR check 08/06/2012   INR from last check 1.3! (07/23/2012)     Weekly max dose (mg)  Target end date Indefinite   Indications HYPERCOAGULABLE STATE, PRIMARY [289.81], HISTORY OF EMBOLIC STROKE WITH LEFT SIDED HEMIPARESIS [V12.49], Long term (current) use of anticoagulants [V58.61]   INR check location [facility] Preferred lab    Send INR reminders to    Comments             ANTICOAG TODAY: Anticoagulation Summary as of 07/23/2012              INR goal 2.0-3.0     Selected INR 1.3! (07/23/2012) Next INR check 08/06/2012   Weekly max dose (mg)  Target end date Indefinite   Indications HYPERCOAGULABLE STATE, PRIMARY [289.81], HISTORY OF EMBOLIC STROKE WITH LEFT SIDED HEMIPARESIS [V12.49], Long term (current) use of anticoagulants [V58.61]    Anticoagulation Episode Summary              INR check location [facility] Preferred lab    Send INR reminders to    Comments             PATIENT INSTRUCTIONS: Patient Instructions  Patient instructed to take medications as defined in the Anti-coagulation Track section of this encounter.  Patient instructed to OMIT today's dose and all remaining doses of warfarin until seen by GI Medicine Physician performing  colonoscopy this Friday. AFTER colonoscopy, based upon findings of GI Medicine Physician and his advice, resume warfarin at 1x5mg  warfarin tablet by mouth daily. This should resume based upon what your GI Medicine Physician tells you---but most typically will recommence within 24 - 72 hours after colonoscopy. IF you have "biopsys" performed (polpy removal) you will typically NOT resume warfarin for 72 hours (3 days).  Patient verbalized understanding of these instructions.        FOLLOW-UP Return in 2 weeks (on 08/06/2012) for Follow up INR at 0900h.  Hulen Luster, III Pharm.D., CACP

## 2012-07-27 DIAGNOSIS — K635 Polyp of colon: Secondary | ICD-10-CM

## 2012-07-27 HISTORY — DX: Polyp of colon: K63.5

## 2012-07-30 ENCOUNTER — Ambulatory Visit: Payer: Medicare HMO | Admitting: Physical Medicine & Rehabilitation

## 2012-08-02 ENCOUNTER — Encounter: Payer: Self-pay | Admitting: Internal Medicine

## 2012-08-03 ENCOUNTER — Encounter: Payer: Self-pay | Admitting: Internal Medicine

## 2012-08-06 ENCOUNTER — Encounter: Payer: Self-pay | Admitting: Internal Medicine

## 2012-08-06 ENCOUNTER — Ambulatory Visit (INDEPENDENT_AMBULATORY_CARE_PROVIDER_SITE_OTHER): Payer: Medicare HMO | Admitting: Pharmacist

## 2012-08-06 ENCOUNTER — Ambulatory Visit (INDEPENDENT_AMBULATORY_CARE_PROVIDER_SITE_OTHER): Payer: Medicare HMO | Admitting: Internal Medicine

## 2012-08-06 VITALS — BP 137/79 | HR 69 | Temp 97.8°F | Ht 72.0 in | Wt 223.3 lb

## 2012-08-06 DIAGNOSIS — Z8673 Personal history of transient ischemic attack (TIA), and cerebral infarction without residual deficits: Secondary | ICD-10-CM

## 2012-08-06 DIAGNOSIS — E7800 Pure hypercholesterolemia, unspecified: Secondary | ICD-10-CM

## 2012-08-06 DIAGNOSIS — I2699 Other pulmonary embolism without acute cor pulmonale: Secondary | ICD-10-CM

## 2012-08-06 DIAGNOSIS — I63411 Cerebral infarction due to embolism of right middle cerebral artery: Secondary | ICD-10-CM

## 2012-08-06 DIAGNOSIS — Z8669 Personal history of other diseases of the nervous system and sense organs: Secondary | ICD-10-CM

## 2012-08-06 DIAGNOSIS — Z0000 Encounter for general adult medical examination without abnormal findings: Secondary | ICD-10-CM

## 2012-08-06 DIAGNOSIS — Z7901 Long term (current) use of anticoagulants: Secondary | ICD-10-CM

## 2012-08-06 DIAGNOSIS — M1710 Unilateral primary osteoarthritis, unspecified knee: Secondary | ICD-10-CM

## 2012-08-06 DIAGNOSIS — D6859 Other primary thrombophilia: Secondary | ICD-10-CM

## 2012-08-06 DIAGNOSIS — I6990 Unspecified sequelae of unspecified cerebrovascular disease: Secondary | ICD-10-CM

## 2012-08-06 LAB — POCT INR: INR: 2.3

## 2012-08-06 MED ORDER — ATORVASTATIN CALCIUM 10 MG PO TABS: 10.0000 mg | ORAL_TABLET | Freq: Every day | ORAL | Status: DC

## 2012-08-06 NOTE — Patient Instructions (Signed)
It was nice to see you again Your prostate results were normal. You may check every year.  We will have your results from the colonoscopy and I will call you. Please continue to follow up with Dr Alexandria Lodge for INR checks  You can always schedule appointment to see me otherwise Please come back in one year.

## 2012-08-06 NOTE — Assessment & Plan Note (Signed)
Recent PSA was normal  Had a coloscopy performed on the 31/20

## 2012-08-06 NOTE — Progress Notes (Signed)
Anti-Coagulation Progress Note  Alexander English is a 57 y.o. male who is currently on an anti-coagulation regimen.    RECENT RESULTS: Recent results are below, the most recent result is correlated with a dose of 37.5 mg. per week: Lab Results  Component Value Date   INR 2.30 08/06/2012   INR 1.3 07/23/2012   INR 3.00 06/25/2012    ANTI-COAG DOSE: Anticoagulation Dose Instructions as of 08/06/2012     [name] Tue Wed Thu Fri Sat   New Dose 5 mg 7.5 mg 5 mg 5 mg 5 mg 5 mg 5 mg       ANTICOAG SUMMARY: Anticoagulation Episode Summary   Current INR goal 2.0-3.0  Next INR check 09/03/2012  INR from last check 2.30 (08/06/2012)  Weekly max dose   Target end date Indefinite  INR check location [facility]  Preferred lab   Send INR reminders to    Indications  HISTORY OF EMBOLIC STROKE WITH LEFT SIDED HEMIPARESIS [V12.49] Long term (current) use of anticoagulants [V58.61]        Comments         ANTICOAG TODAY: Anticoagulation Summary as of 08/06/2012   INR goal 2.0-3.0  Selected INR 2.30 (08/06/2012)  Next INR check 09/03/2012  Target end date Indefinite   Indications  HISTORY OF EMBOLIC STROKE WITH LEFT SIDED HEMIPARESIS [V12.49] Long term (current) use of anticoagulants [V58.61]      Anticoagulation Episode Summary   INR check location [facility]   Preferred lab    Send INR reminders to    Comments       PATIENT INSTRUCTIONS: Patient Instructions  Patient instructed to take medications as defined in the Anti-coagulation Track section of this encounter.  Patient instructed to take today's dose.  Patient verbalized understanding of these instructions.       FOLLOW-UP Return in 4 weeks (on 09/03/2012) for Follow up INR at 1030h.  Hulen Luster, III Pharm.D., CACP

## 2012-08-06 NOTE — Progress Notes (Signed)
Patient ID: Alexander English, male   DOB: 1956/03/28, 57 y.o.   MRN: [id number] Patient ID: Alexander English, male   DOB: 04/13/1956, 57 y.o.   MRN: [id number]  Subjective:   [name]   DOB: 1956/06/16 57 y.o.   MRN: [id number]  HPI: [name] is a 57 y.o. with a past medical history of a stroke in 2006 and a pulmonary embolism who is on indefinite Coumadin therapy, presents to the clinic today for followup visit and medication refill.  Please see the A&P for the status of the pt's chronic medical problems.   Past Medical History  Diagnosis Date  . Hyperlipidemia     Maintained on statin  . Stroke 2006    Occured after traveling from [place] to [place]. Was hospitalized in [place] but no W/U or tx. Returned to [place] unable to walk and was admitted to [facility] immediately after landing. Presumed embolic per neuro but TEE, bubble study, and hypercoag W/U negative.   . PE (pulmonary embolism) 2006    DX at [facility] after traveling to and from [place]. Indefinite warfarin.   . Tobacco abuse, in remission     Quit in 2006   Current Outpatient Prescriptions  Medication Sig Dispense Refill  . aspirin 81 MG tablet Take 81 mg by mouth daily.      Marland Kitchen atorvastatin (LIPITOR) 10 MG tablet Take 1 tablet (10 mg total) by mouth daily.  30 tablet  11  . calcium-vitamin D (OSCAL WITH D) 500-200 MG-UNIT per tablet Take 1 tablet by mouth daily.      . diclofenac sodium (VOLTAREN) 1 % GEL Apply 1 application topically 4 (four) times daily.  5 Tube  5  . Multiple Vitamin (MULTIVITAMIN) tablet Take 1 tablet by mouth daily.      Marland Kitchen warfarin (COUMADIN) 5 MG tablet Take 1 tablet (5 mg total) by mouth daily.  30 tablet  1  . TRILYTE 420 G solution        No current facility-administered medications for this visit.   Family History  Problem Relation Age of Onset  . Stroke Maternal Uncle   . Stroke Maternal Grandmother    History   Social History  . Marital Status: Divorced   Spouse Name: N/A    Number of Children: N/A  . Years of Education: N/A   Social History Main Topics  . Smoking status: Former Games developer  . Smokeless tobacco: None  . Alcohol Use: No  . Drug Use: No  . Sexually Active: None   Other Topics Concern  . None   Social History Narrative  . None   Review of Systems: Review of Systems  Constitutional: Negative.   HENT: Negative.   Eyes: Negative.   Respiratory: Negative.   Cardiovascular: Negative.   Gastrointestinal: Negative.   Genitourinary: Negative.   Musculoskeletal: Negative.   Skin: Negative.   Neurological:       He has a long-standing weakness on the left side from a previous stroke. He is able to ambulate using a cane.  Endo/Heme/Allergies: Negative.   Psychiatric/Behavioral: Negative.     Objective:  Physical Exam: Filed Vitals:   08/06/12 1002  BP: 137/79  Pulse: 69  Temp: 97.8 F (36.6 C)  TempSrc: Oral  Height: 6' (1.829 m)  Weight: 223 lb 4.8 oz (101.288 kg)  SpO2: 100%   Physical Exam  Constitutional: He is oriented to person, place, and time and well-developed, well-nourished, and in no distress.  No distress.  HENT:  Head: Normocephalic.  Eyes: EOM are normal.  Neck: Normal range of motion.  Cardiovascular: Normal rate, regular rhythm, normal heart sounds and intact distal pulses.  Exam reveals no friction rub.   No murmur heard. Pulmonary/Chest: Effort normal and breath sounds normal. No respiratory distress. He has no wheezes. He has no rales. He exhibits no tenderness.  Abdominal: Soft. Bowel sounds are normal.  Musculoskeletal: Normal range of motion. He exhibits no edema.  Neurological: He is alert and oriented to person, place, and time.  Left-sided hemiparesis.  Skin: Skin is warm. He is not diaphoretic.  Psychiatric: Affect normal.     Assessment & Plan:  The assessment and plan for the care of this patient has been discussed with Dr. Kem Kays. Please my problem based charting.  The  patient is doing fine and we will get his colonoscopy results. He will follow up in 1 year. He will continue to see Dr Alexandria Lodge at the coumadin clinic.

## 2012-08-06 NOTE — Progress Notes (Signed)
Agree 

## 2012-08-06 NOTE — Patient Instructions (Signed)
Patient instructed to take medications as defined in the Anti-coagulation Track section of this encounter.  Patient instructed to take today's dose.  Patient verbalized understanding of these instructions.    

## 2012-08-14 ENCOUNTER — Encounter: Payer: Self-pay | Admitting: Internal Medicine

## 2012-09-03 ENCOUNTER — Ambulatory Visit (INDEPENDENT_AMBULATORY_CARE_PROVIDER_SITE_OTHER): Payer: Medicare HMO | Admitting: Pharmacist

## 2012-09-03 DIAGNOSIS — I6990 Unspecified sequelae of unspecified cerebrovascular disease: Secondary | ICD-10-CM

## 2012-09-03 DIAGNOSIS — D6859 Other primary thrombophilia: Secondary | ICD-10-CM

## 2012-09-03 DIAGNOSIS — Z8669 Personal history of other diseases of the nervous system and sense organs: Secondary | ICD-10-CM

## 2012-09-03 DIAGNOSIS — Z7901 Long term (current) use of anticoagulants: Secondary | ICD-10-CM

## 2012-09-03 DIAGNOSIS — Z8673 Personal history of transient ischemic attack (TIA), and cerebral infarction without residual deficits: Secondary | ICD-10-CM

## 2012-09-03 LAB — POCT INR: INR: 3.1

## 2012-09-03 NOTE — Patient Instructions (Signed)
Patient instructed to take medications as defined in the Anti-coagulation Track section of this encounter.  Patient instructed to take today's dose.  Patient verbalized understanding of these instructions.    

## 2012-09-03 NOTE — Progress Notes (Signed)
Anti-Coagulation Progress Note  Alexander English is a 58 y.o. male who is currently on an anti-coagulation regimen.    RECENT RESULTS: Recent results are below, the most recent result is correlated with a dose of 37.5 mg. per week: Lab Results  Component Value Date   INR 3.10 09/03/2012   INR 2.30 08/06/2012   INR 1.3 07/23/2012    ANTI-COAG DOSE: Anticoagulation Dose Instructions as of 09/03/2012     [name] Tue Wed Thu Fri Sat   New Dose 5 mg 5 mg 5 mg 5 mg 5 mg 5 mg 5 mg       ANTICOAG SUMMARY: Anticoagulation Episode Summary   Current INR goal 2.0-3.0  Next INR check 09/24/2012  INR from last check 3.10! (09/03/2012)  Weekly max dose   Target end date Indefinite  [facility] location [facility]  Preferred lab   Send INR reminders to    Indications  HISTORY OF EMBOLIC STROKE WITH LEFT SIDED HEMIPARESIS [V12.49] Long term (current) use of anticoagulants [V58.61]        Comments         ANTICOAG TODAY: Anticoagulation Summary as of 09/03/2012   INR goal 2.0-3.0  Selected INR 3.10! (09/03/2012)  Next INR check 09/24/2012  Target end date Indefinite   Indications  HISTORY OF EMBOLIC STROKE WITH LEFT SIDED HEMIPARESIS [V12.49] Long term (current) use of anticoagulants [V58.61]      Anticoagulation Episode Summary   INR check location [facility]   Preferred lab    Send INR reminders to    Comments       PATIENT INSTRUCTIONS: Patient Instructions  Patient instructed to take medications as defined in the Anti-coagulation Track section of this encounter.  Patient instructed to take today's dose.  Patient verbalized understanding of these instructions.       FOLLOW-UP Return in 3 weeks (on 09/24/2012) for Follow up INR at 1015h.  Hulen Luster, III Pharm.D., CACP

## 2012-09-17 ENCOUNTER — Ambulatory Visit (INDEPENDENT_AMBULATORY_CARE_PROVIDER_SITE_OTHER): Payer: Medicare HMO | Admitting: Pharmacist

## 2012-09-17 DIAGNOSIS — Z7901 Long term (current) use of anticoagulants: Secondary | ICD-10-CM

## 2012-09-17 DIAGNOSIS — D6859 Other primary thrombophilia: Secondary | ICD-10-CM

## 2012-09-17 DIAGNOSIS — Z8673 Personal history of transient ischemic attack (TIA), and cerebral infarction without residual deficits: Secondary | ICD-10-CM

## 2012-09-17 DIAGNOSIS — Z8669 Personal history of other diseases of the nervous system and sense organs: Secondary | ICD-10-CM

## 2012-09-17 DIAGNOSIS — I6990 Unspecified sequelae of unspecified cerebrovascular disease: Secondary | ICD-10-CM

## 2012-09-17 LAB — POCT INR: INR: 4.2

## 2012-09-17 NOTE — Progress Notes (Signed)
Anti-Coagulation Progress Note  Alexander English is a 57 y.o. male who is currently on an anti-coagulation regimen.    RECENT RESULTS: Recent results are below, the most recent result is correlated with a dose of 35 mg. per week: Lab Results  Component Value Date   INR 4.20 09/17/2012   INR 3.10 09/03/2012   INR 2.30 08/06/2012    ANTI-COAG DOSE: Anticoagulation Dose Instructions as of 09/17/2012     [name] Tue Wed Thu Fri Sat   New Dose 5 mg 2.5 mg 5 mg 5 mg 2.5 mg 5 mg 5 mg       ANTICOAG SUMMARY: Anticoagulation Episode Summary   Current INR goal 2.0-3.0  Next INR check 10/01/2012  INR from last check 4.20! (09/17/2012)  Weekly max dose   Target end date Indefinite  [facility]R check location [facility]  Preferred lab   Send INR reminders to    Indications  HISTORY OF EMBOLIC STROKE WITH LEFT SIDED HEMIPARESIS [V12.49] Long term (current) use of anticoagulants [V58.61]        Comments         ANTICOAG TODAY: Anticoagulation Summary as of 09/17/2012   INR goal 2.0-3.0  Selected INR 4.20! (09/17/2012)  Next INR check 10/01/2012  Target end date Indefinite   Indications  HISTORY OF EMBOLIC STROKE WITH LEFT SIDED HEMIPARESIS [V12.49] Long term (current) use of anticoagulants [V58.61]      Anticoagulation Episode Summary   [facility]R check location [facility]   Preferred lab    Send INR reminders to    Comments       PATIENT INSTRUCTIONS: Patient Instructions  Patient instructed to take medications as defined in the Anti-coagulation Track section of this encounter.  Patient instructed to OMIT today's dose.  Patient verbalized understanding of these instructions.       FOLLOW-UP Return in 2 weeks (on 10/01/2012) for Follow up INR at 0915h.  Hulen Luster, III Pharm.D., CACP

## 2012-09-17 NOTE — Patient Instructions (Signed)
Patient instructed to take medications as defined in the Anti-coagulation Track section of this encounter.  Patient instructed to OMIT today's dose.  Patient verbalized understanding of these instructions.    

## 2012-09-24 ENCOUNTER — Ambulatory Visit: Payer: Medicare HMO

## 2012-10-01 ENCOUNTER — Ambulatory Visit: Payer: Medicare HMO

## 2012-10-08 ENCOUNTER — Ambulatory Visit (INDEPENDENT_AMBULATORY_CARE_PROVIDER_SITE_OTHER): Payer: Medicare HMO | Admitting: Pharmacist

## 2012-10-08 DIAGNOSIS — Z8669 Personal history of other diseases of the nervous system and sense organs: Secondary | ICD-10-CM

## 2012-10-08 DIAGNOSIS — D6859 Other primary thrombophilia: Secondary | ICD-10-CM

## 2012-10-08 DIAGNOSIS — I63411 Cerebral infarction due to embolism of right middle cerebral artery: Secondary | ICD-10-CM

## 2012-10-08 DIAGNOSIS — Z7901 Long term (current) use of anticoagulants: Secondary | ICD-10-CM

## 2012-10-08 DIAGNOSIS — I2699 Other pulmonary embolism without acute cor pulmonale: Secondary | ICD-10-CM

## 2012-10-08 DIAGNOSIS — Z8673 Personal history of transient ischemic attack (TIA), and cerebral infarction without residual deficits: Secondary | ICD-10-CM

## 2012-10-08 DIAGNOSIS — I6990 Unspecified sequelae of unspecified cerebrovascular disease: Secondary | ICD-10-CM

## 2012-10-08 LAB — POCT INR: INR: 2.4

## 2012-10-08 MED ORDER — WARFARIN SODIUM 5 MG PO TABS: 5.0000 mg | ORAL_TABLET | Freq: Every day | ORAL | Status: DC

## 2012-10-08 NOTE — Patient Instructions (Signed)
Patient instructed to take medications as defined in the Anti-coagulation Track section of this encounter.  Patient instructed to take today's dose.  Patient verbalized understanding of these instructions.    

## 2012-10-08 NOTE — Progress Notes (Signed)
Indication: Thromboembolism. Duration: Lifelong per patient preference. INR at target. I agree with Dr. Groce's assessment and plan as documented. 

## 2012-10-08 NOTE — Progress Notes (Signed)
Anti-Coagulation Progress Note  Alexander English is a 57 y.o. male who is currently on an anti-coagulation regimen.    RECENT RESULTS: Recent results are below, the most recent result is correlated with a dose of 30 mg. per week: Lab Results  Component Value Date   INR 2.40 10/08/2012   INR 4.20 09/17/2012   INR 3.10 09/03/2012    ANTI-COAG DOSE: Anticoagulation Dose Instructions as of 10/08/2012     [name] Tue Wed Thu Fri Sat   New Dose 5 mg 2.5 mg 5 mg 5 mg 2.5 mg 5 mg 5 mg       ANTICOAG SUMMARY: Anticoagulation Episode Summary   Current INR goal 2.0-3.0  Next INR check 11/05/2012  INR from last check 2.40 (10/08/2012)  Weekly max dose   Target end date Indefinite  [facility] check [facility]  Preferred lab   Send INR reminders to    Indications  HISTORY OF EMBOLIC STROKE WITH LEFT SIDED HEMIPARESIS [V12.49] Long term (current) use of anticoagulants [V58.61]        Comments         ANTICOAG TODAY: Anticoagulation Summary as of 10/08/2012   INR goal 2.0-3.0  Selected INR 2.40 (10/08/2012)  Next INR check 11/05/2012  Target end date Indefinite   Indications  HISTORY OF EMBOLIC STROKE WITH LEFT SIDED HEMIPARESIS [V12.49] Long term (current) use of anticoagulants [V58.61]      Anticoagulation Episode Summary   [facility] check location [facility]   Preferred lab    Send INR reminders to    Comments       PATIENT INSTRUCTIONS: Patient Instructions  Patient instructed to take medications as defined in the Anti-coagulation Track section of this encounter.  Patient instructed to take today's dose.  Patient verbalized understanding of these instructions.       FOLLOW-UP Return in 4 weeks (on 11/05/2012) for Follow up INR at 0915h.  Hulen Luster, III Pharm.D., CACP

## 2012-10-08 NOTE — Addendum Note (Signed)
Addended by: Hulen Luster B on: 10/08/2012 09:45 AM   Modules accepted: Orders

## 2012-11-05 ENCOUNTER — Ambulatory Visit (INDEPENDENT_AMBULATORY_CARE_PROVIDER_SITE_OTHER): Payer: Medicare HMO | Admitting: Pharmacist

## 2012-11-05 DIAGNOSIS — D6859 Other primary thrombophilia: Secondary | ICD-10-CM

## 2012-11-05 DIAGNOSIS — Z8669 Personal history of other diseases of the nervous system and sense organs: Secondary | ICD-10-CM

## 2012-11-05 DIAGNOSIS — I6990 Unspecified sequelae of unspecified cerebrovascular disease: Secondary | ICD-10-CM

## 2012-11-05 DIAGNOSIS — Z7901 Long term (current) use of anticoagulants: Secondary | ICD-10-CM

## 2012-11-05 DIAGNOSIS — Z8673 Personal history of transient ischemic attack (TIA), and cerebral infarction without residual deficits: Secondary | ICD-10-CM

## 2012-11-05 LAB — POCT INR: INR: 2.1

## 2012-11-05 NOTE — Progress Notes (Signed)
Anti-Coagulation Progress Note  Alexander English is a 57 y.o. male who is currently on an anti-coagulation regimen.    RECENT RESULTS: Recent results are below, the most recent result is correlated with a dose of 30 mg. per week: Lab Results  Component Value Date   INR 2.10 11/05/2012   INR 2.40 10/08/2012   INR 4.20 09/17/2012    ANTI-COAG DOSE: Anticoagulation Dose Instructions as of 11/05/2012     [name] Tue Wed Thu Fri Sat   New Dose 5 mg 5 mg 5 mg 2.5 mg 5 mg 5 mg 5 mg       ANTICOAG SUMMARY: Anticoagulation Episode Summary   Current INR goal 2.0-3.0  Next INR check 12/03/2012  INR from last check 2.10 (11/05/2012)  Weekly max dose   Target end date Indefinite  [facility] check location [facility]  Preferred lab   Send INR reminders to    Indications  HISTORY OF EMBOLIC STROKE WITH LEFT SIDED HEMIPARESIS [V12.49] Long term (current) use of anticoagulants [V58.61]        Comments         ANTICOAG TODAY: Anticoagulation Summary as of 11/05/2012   INR goal 2.0-3.0  Selected INR 2.10 (11/05/2012)  Next INR check 12/03/2012  Target end date Indefinite   Indications  HISTORY OF EMBOLIC STROKE WITH LEFT SIDED HEMIPARESIS [V12.49] Long term (current) use of anticoagulants [V58.61]      Anticoagulation Episode Summary   [facility] check location [facility]   Preferred lab    Send INR reminders to    Comments       PATIENT INSTRUCTIONS: Patient Instructions  Patient instructed to take medications as defined in the Anti-coagulation Track section of this encounter.  Patient instructed to take today's dose.  Patient verbalized understanding of these instructions.       FOLLOW-UP Return in 4 weeks (on 12/03/2012) for Follow up INR at 0900h.  Hulen Luster, III Pharm.D., CACP

## 2012-11-05 NOTE — Patient Instructions (Signed)
Patient instructed to take medications as defined in the Anti-coagulation Track section of this encounter.  Patient instructed to take today's dose.  Patient verbalized understanding of these instructions.    

## 2012-11-05 NOTE — Progress Notes (Signed)
Indication: Thromboembolism. Duration: Lifelong per patient preference. INR at target. I agree with Dr. Saralyn Pilar assessment and plan as documented.

## 2012-12-03 ENCOUNTER — Ambulatory Visit (INDEPENDENT_AMBULATORY_CARE_PROVIDER_SITE_OTHER): Payer: Medicare HMO | Admitting: Pharmacist

## 2012-12-03 DIAGNOSIS — Z8673 Personal history of transient ischemic attack (TIA), and cerebral infarction without residual deficits: Secondary | ICD-10-CM

## 2012-12-03 DIAGNOSIS — Z7901 Long term (current) use of anticoagulants: Secondary | ICD-10-CM

## 2012-12-03 DIAGNOSIS — I6990 Unspecified sequelae of unspecified cerebrovascular disease: Secondary | ICD-10-CM

## 2012-12-03 DIAGNOSIS — D6859 Other primary thrombophilia: Secondary | ICD-10-CM

## 2012-12-03 DIAGNOSIS — Z8669 Personal history of other diseases of the nervous system and sense organs: Secondary | ICD-10-CM

## 2012-12-03 LAB — POCT INR: INR: 2.5

## 2012-12-03 NOTE — Progress Notes (Signed)
Anti-Coagulation Progress Note  Alexander English is a 57 y.o. male who is currently on an anti-coagulation regimen.    RECENT RESULTS: Recent results are below, the most recent result is correlated with a dose of 32.5 mg. per week: Lab Results  Component Value Date   INR 2.50 12/03/2012   INR 2.10 11/05/2012   INR 2.40 10/08/2012    ANTI-COAG DOSE: Anticoagulation Dose Instructions as of 12/03/2012     [name] Tue Wed Thu Fri Sat   New Dose 5 mg 5 mg 5 mg 2.5 mg 5 mg 5 mg 5 mg       ANTICOAG SUMMARY: Anticoagulation Episode Summary   Current INR goal 2.0-3.0  Next INR check 12/31/2012  INR from last check 2.50 (12/03/2012)  Weekly max dose   Target end date Indefinite  [facility] check location [facility]  Preferred lab   Send INR reminders to    Indications  HISTORY OF EMBOLIC STROKE WITH LEFT SIDED HEMIPARESIS [V12.49] Long term (current) use of anticoagulants [V58.61]        Comments         ANTICOAG TODAY: Anticoagulation Summary as of 12/03/2012   INR goal 2.0-3.0  Selected INR 2.50 (12/03/2012)  Next INR check 12/31/2012  Target end date Indefinite   Indications  HISTORY OF EMBOLIC STROKE WITH LEFT SIDED HEMIPARESIS [V12.49] Long term (current) use of anticoagulants [V58.61]      Anticoagulation Episode Summary   [facility] check location [facility]   Preferred lab    Send INR reminders to    Comments       PATIENT INSTRUCTIONS: Patient Instructions  Patient instructed to take medications as defined in the Anti-coagulation Track section of this encounter.  Patient instructed to take today's dose.  Patient verbalized understanding of these instructions.       FOLLOW-UP Return in 4 weeks (on 12/31/2012) for Follow up INR at 0930h.  Hulen Luster, III Pharm.D., CACP

## 2012-12-03 NOTE — Patient Instructions (Signed)
Patient instructed to take medications as defined in the Anti-coagulation Track section of this encounter.  Patient instructed to take today's dose.  Patient verbalized understanding of these instructions.    

## 2012-12-07 ENCOUNTER — Encounter (HOSPITAL_COMMUNITY): Payer: Self-pay | Admitting: *Deleted

## 2012-12-07 ENCOUNTER — Emergency Department (HOSPITAL_COMMUNITY)
Admission: EM | Admit: 2012-12-07 | Discharge: 2012-12-07 | Disposition: A | Discharge status: Home / Self Care | Payer: Medicare HMO | Attending: Emergency Medicine | Admitting: Emergency Medicine

## 2012-12-07 DIAGNOSIS — Z79899 Other long term (current) drug therapy: Secondary | ICD-10-CM | POA: Insufficient documentation

## 2012-12-07 DIAGNOSIS — Y9389 Activity, other specified: Secondary | ICD-10-CM | POA: Insufficient documentation

## 2012-12-07 DIAGNOSIS — R6883 Chills (without fever): Secondary | ICD-10-CM | POA: Insufficient documentation

## 2012-12-07 DIAGNOSIS — I6350 Cerebral infarction due to unspecified occlusion or stenosis of unspecified cerebral artery: Secondary | ICD-10-CM | POA: Insufficient documentation

## 2012-12-07 DIAGNOSIS — Z87891 Personal history of nicotine dependence: Secondary | ICD-10-CM | POA: Insufficient documentation

## 2012-12-07 DIAGNOSIS — Z86711 Personal history of pulmonary embolism: Secondary | ICD-10-CM | POA: Insufficient documentation

## 2012-12-07 DIAGNOSIS — Y999 Unspecified external cause status: Secondary | ICD-10-CM | POA: Insufficient documentation

## 2012-12-07 DIAGNOSIS — Z7901 Long term (current) use of anticoagulants: Secondary | ICD-10-CM | POA: Insufficient documentation

## 2012-12-07 DIAGNOSIS — Z860100 Personal history of colon polyps, unspecified: Secondary | ICD-10-CM | POA: Insufficient documentation

## 2012-12-07 DIAGNOSIS — W268XXA Contact with other sharp object(s), not elsewhere classified, initial encounter: Secondary | ICD-10-CM | POA: Insufficient documentation

## 2012-12-07 DIAGNOSIS — Z7982 Long term (current) use of aspirin: Secondary | ICD-10-CM | POA: Insufficient documentation

## 2012-12-07 DIAGNOSIS — Y929 Unspecified place or not applicable: Secondary | ICD-10-CM | POA: Insufficient documentation

## 2012-12-07 DIAGNOSIS — S0120XA Unspecified open wound of nose, initial encounter: Secondary | ICD-10-CM | POA: Insufficient documentation

## 2012-12-07 DIAGNOSIS — R58 Hemorrhage, not elsewhere classified: Secondary | ICD-10-CM

## 2012-12-07 DIAGNOSIS — E785 Hyperlipidemia, unspecified: Secondary | ICD-10-CM | POA: Insufficient documentation

## 2012-12-07 DIAGNOSIS — Z8673 Personal history of transient ischemic attack (TIA), and cerebral infarction without residual deficits: Secondary | ICD-10-CM | POA: Insufficient documentation

## 2012-12-07 LAB — CBC WITH DIFFERENTIAL/PLATELET
Basophils Absolute: 0 10*3/uL (ref 0.0–0.1)
Basophils Relative: 1 % (ref 0–1)
Eosinophils Absolute: 0.5 10*3/uL (ref 0.0–0.7)
Eosinophils Relative: 9 % — ABNORMAL HIGH (ref 0–5)
HCT: 43.1 % (ref 39.0–52.0)
Hemoglobin: 14.6 g/dL (ref 13.0–17.0)
Lymphocytes Relative: 49 % — ABNORMAL HIGH (ref 12–46)
Lymphs Abs: 2.5 10*3/uL (ref 0.7–4.0)
MCH: 27.5 pg (ref 26.0–34.0)
MCHC: 33.9 g/dL (ref 30.0–36.0)
MCV: 81.3 fL (ref 78.0–100.0)
Monocytes Absolute: 0.4 10*3/uL (ref 0.1–1.0)
Monocytes Relative: 7 % (ref 3–12)
Neutro Abs: 1.7 10*3/uL (ref 1.7–7.7)
Neutrophils Relative %: 34 % — ABNORMAL LOW (ref 43–77)
Platelets: 169 10*3/uL (ref 150–400)
RBC: 5.3 MIL/uL (ref 4.22–5.81)
RDW: 14.4 % (ref 11.5–15.5)
WBC: 5.1 10*3/uL (ref 4.0–10.5)

## 2012-12-07 LAB — PROTIME-INR
INR: 2.14 — ABNORMAL HIGH (ref 0.00–1.49)
Prothrombin Time: 23 seconds — ABNORMAL HIGH (ref 11.6–15.2)

## 2012-12-07 MED ORDER — TETANUS-DIPHTH-ACELL PERTUSSIS 5-2.5-18.5 LF-MCG/0.5 IM SUSP
0.5000 mL | Freq: Once | INTRAMUSCULAR | Status: DC
  Filled 2012-12-07: qty 0.5

## 2012-12-07 NOTE — ED Notes (Signed)
Pt states that he was shaving last night and cut his nose with razor. Pt is on coumadin and has continued to bleed. Pt presents with tissue to nose and when removed bleeding continues.

## 2012-12-07 NOTE — ED Provider Notes (Signed)
Medical screening examination/treatment/procedure(s) were conducted as a shared visit with non-physician practitioner(s) and myself.  I personally evaluated the patient during the encounter  BP 119/81  Pulse 80  Temp(Src) 98.6 F (37 C) (Oral)  Resp 18  SpO2 99% Bleeding from small cut to inferior nasal septum, on coumadin. Bleeding stopped with quick clot. Vitals stable. INR therapeutic.  Glynn Octave, MD 12/07/12 [phone]

## 2012-12-07 NOTE — ED Provider Notes (Signed)
History     CSN: [id number]  Arrival date & time 12/07/12  4098   First MD Initiated Contact with Patient 12/07/12 [phone]      Chief Complaint  Patient presents with  . Facial Laceration    (Consider location/radiation/quality/duration/timing/severity/associated sxs/prior treatment) HPI Comments: Patient is a 57 year old male with history of stroke and blood clots for which he is on 5 mg of Coumadin daily. He cut himself shaving last night and placed a Band-Aid over the cut. When he woke up this morning he continued to bleed. The last time he had his INR checked was on Monday and it was 2.5. Last tetanus shot was one year ago. He states he is otherwise feeling well, no lightheadedness, no SOB, no headache, no fever, no chills, no nausea, no vomiting, no abdominal pain.  The history is provided by the patient. No language interpreter was used.    Past Medical History  Diagnosis Date  . Hyperlipidemia     Maintained on statin  . Stroke 2006    Occured after traveling from [place] to [place]. Was hospitalized in [place] but no W/U or tx. Returned to [place] unable to walk and was admitted to [facility] immediately after landing. Presumed embolic per neuro but TEE, bubble study, and hypercoag W/U negative.   . PE (pulmonary embolism) 2006    DX at [facility] after traveling to and from [place]. Indefinite warfarin.   . Tobacco abuse, in remission     Quit in 2006  . Hyperplastic colon polyp 2014    Non-neoplastic, colonoscopy/10years    Past Surgical History  Procedure Laterality Date  . Foot surgery    . I&d extremity  2003    Left thumb thenar space abscess.    Family History  Problem Relation Age of Onset  . Stroke Maternal Uncle   . Stroke Maternal Grandmother     History  Substance Use Topics  . Smoking status: Former Games developer  . Smokeless tobacco: Not on file  . Alcohol Use: No      Review of Systems  Constitutional: Positive for chills. Negative for fever.  Respiratory: Negative  for shortness of breath.   Cardiovascular: Negative for chest pain.  Gastrointestinal: Negative for nausea, vomiting and abdominal pain.  Neurological: Negative for dizziness, weakness, light-headedness and headaches.  Hematological: Bruises/bleeds easily (anticoagulated ).  All other systems reviewed and are negative.    Allergies  Review of patient's allergies indicates no known allergies.  Home Medications   Current Outpatient Rx  Name  Route  Sig  Dispense  Refill  . aspirin 81 MG tablet   Oral   Take 81 mg by mouth daily.         Marland Kitchen atorvastatin (LIPITOR) 10 MG tablet   Oral   Take 1 tablet (10 mg total) by mouth daily.   30 tablet   11   . calcium-vitamin D (OSCAL WITH D) 500-200 MG-UNIT per tablet   Oral   Take 1 tablet by mouth daily.         . diclofenac sodium (VOLTAREN) 1 % GEL   Topical   Apply 1 application topically 4 (four) times daily.   5 Tube   5     Both knees   . Multiple Vitamin (MULTIVITAMIN) tablet   Oral   Take 1 tablet by mouth daily.         . TRILYTE 420 G solution               .  warfarin (COUMADIN) 5 MG tablet   Oral   Take 1 tablet (5 mg total) by mouth daily.   30 tablet   1     There were no vitals taken for this visit.  Physical Exam  Nursing note and vitals reviewed. Constitutional: He is oriented to person, place, and time. He appears well-developed and well-nourished. No distress.  HENT:  Head: Normocephalic and atraumatic.  Right Ear: External ear normal.  Left Ear: External ear normal.  Nose: Nose lacerations present.  Mouth/Throat: Uvula is midline, oropharynx is clear and moist and mucous membranes are normal.  5 mm laceration to exterior nasal septum   Eyes: Conjunctivae are normal.  Neck: Normal range of motion. No tracheal deviation present.  Cardiovascular: Normal rate, regular rhythm and normal heart sounds.   Pulmonary/Chest: Effort normal and breath sounds normal. No stridor.  Abdominal: Soft.  He exhibits no distension. There is no tenderness.  Musculoskeletal: Normal range of motion.  Neurological: He is alert and oriented to person, place, and time.  Skin: Skin is warm and dry. He is not diaphoretic.  Psychiatric: He has a normal mood and affect. His behavior is normal.    ED Course  Procedures (including critical care time)  Labs Reviewed  CBC WITH DIFFERENTIAL - Abnormal; Notable for the following:    Neutrophils Relative % 34 (*)    Lymphocytes Relative 49 (*)    Eosinophils Relative 9 (*)    All other components within normal limits  PROTIME-INR - Abnormal; Notable for the following:    Prothrombin Time 23.0 (*)    INR 2.14 (*)    All other components within normal limits   No results found.  1610: Quick Clot gauze pad applied to area.   1. Bleeding       MDM  Patient presents with bleeding some small knick on exterior nasal septum. He is on Coumadin with INR of 2.5. Bleeding was controlled with quick clot gauze. No anemia. Vital signs stable for discharge. Return instructions given. Follow up with your PCP. Patient / Family / Caregiver informed of clinical course, understand medical decision-making process, and agree with plan.         Mora Bellman, PA-C 12/07/12 1053

## 2012-12-10 ENCOUNTER — Encounter: Payer: Self-pay | Admitting: *Deleted

## 2012-12-10 ENCOUNTER — Ambulatory Visit (INDEPENDENT_AMBULATORY_CARE_PROVIDER_SITE_OTHER): Payer: Medicare HMO | Admitting: Pharmacist

## 2012-12-10 DIAGNOSIS — D6859 Other primary thrombophilia: Secondary | ICD-10-CM

## 2012-12-10 DIAGNOSIS — I6990 Unspecified sequelae of unspecified cerebrovascular disease: Secondary | ICD-10-CM

## 2012-12-10 DIAGNOSIS — Z8669 Personal history of other diseases of the nervous system and sense organs: Secondary | ICD-10-CM

## 2012-12-10 DIAGNOSIS — Z7901 Long term (current) use of anticoagulants: Secondary | ICD-10-CM

## 2012-12-10 DIAGNOSIS — Z8673 Personal history of transient ischemic attack (TIA), and cerebral infarction without residual deficits: Secondary | ICD-10-CM

## 2012-12-10 LAB — POCT INR: INR: 2

## 2012-12-10 NOTE — Progress Notes (Signed)
Anti-Coagulation Progress Note  Alexander English is a 58 y.o. male who is currently on an anti-coagulation regimen.    RECENT RESULTS: Recent results are below, the most recent result is correlated with a dose of 32.5 mg. per week: Was seen in ED on Friday 13-JUN-14 with 1 day prior to admission to ED episode of bleeding from external nasal septum after cutting himself with his safety razor. The area continued to "drip-drip-drip" blood for which the following morning after application of band aid--he dislodged the temporary clot at site and it commenced bleeding again. In ED his INR was noted to be 2.14. He had been seen just the Monday 9-JUN-14 prior to this episode whereupon his INR was documented as 2.5.  They used a topical thrombostatic product "Quick Clot" to establish site hemostasis. He arrives to day seeking information about this product. On-line we could not find a retail vendor, the product was noted to be sold by several Internet sites at $9+ retail. We suggested use of a simple Styptic Pencil available over the counter at [facility] and [facility] for less than $3 which acts as a topical astringent for control of "shaving" related bleeding. He was provided a print-out regarding the product's name and advised to purchase at the pharmacy of his choosing. He was also counseled to hold pressure on any area that is accessible to hold pressure--if there were to ever be a recurrence of bleeding while shaving again.  Lab Results  Component Value Date   INR 2.00 12/10/2012   INR 2.14* 12/07/2012   INR 2.50 12/03/2012    ANTI-COAG DOSE: Anticoagulation Dose Instructions as of 12/10/2012     [name] Tue Wed Thu Fri Sat   New Dose 5 mg 5 mg 5 mg 2.5 mg 5 mg 5 mg 5 mg       ANTICOAG SUMMARY: Anticoagulation Episode Summary   Current INR goal 2.0-3.0  Next INR check 12/24/2012  INR from last check 2.00 (12/10/2012)  Weekly max dose   Target end date Indefinite  [facility] check location [facility]   Preferred lab   Send INR reminders to    Indications  HISTORY OF EMBOLIC STROKE WITH LEFT SIDED HEMIPARESIS [V12.49] Long term (current) use of anticoagulants [V58.61]        Comments         ANTICOAG TODAY: Anticoagulation Summary as of 12/10/2012   INR goal 2.0-3.0  Selected INR 2.00 (12/10/2012)  Next INR check 12/24/2012  Target end date Indefinite   Indications  HISTORY OF EMBOLIC STROKE WITH LEFT SIDED HEMIPARESIS [V12.49] Long term (current) use of anticoagulants [V58.61]      Anticoagulation Episode Summary   [facility]R check location [facility]   Preferred lab    Send INR reminders to    Comments       PATIENT INSTRUCTIONS: Patient Instructions  Patient instructed to take medications as defined in the Anti-coagulation Track section of this encounter.  Patient instructed to take today's dose.  Patient verbalized understanding of these instructions.       FOLLOW-UP Return in 2 weeks (on 12/24/2012) for Follow up at 0930h.  Hulen Luster, III Pharm.D., CACP

## 2012-12-10 NOTE — Progress Notes (Signed)
Pt presents c/o nose bleed taking him to ED 6/13, reviewed note, spoke w/ dr Alexandria Lodge, he will see pt first and then it will be decided for appt

## 2012-12-10 NOTE — Progress Notes (Signed)
Thanks.  That sounds good.

## 2012-12-10 NOTE — Patient Instructions (Signed)
Patient instructed to take medications as defined in the Anti-coagulation Track section of this encounter.  Patient instructed to take today's dose.  Patient verbalized understanding of these instructions.    

## 2012-12-17 LAB — POCT INR: INR: 2.5

## 2012-12-24 ENCOUNTER — Ambulatory Visit (INDEPENDENT_AMBULATORY_CARE_PROVIDER_SITE_OTHER): Payer: Medicare HMO | Admitting: Pharmacist

## 2012-12-24 DIAGNOSIS — Z8669 Personal history of other diseases of the nervous system and sense organs: Secondary | ICD-10-CM

## 2012-12-24 DIAGNOSIS — I6990 Unspecified sequelae of unspecified cerebrovascular disease: Secondary | ICD-10-CM

## 2012-12-24 DIAGNOSIS — Z7901 Long term (current) use of anticoagulants: Secondary | ICD-10-CM

## 2012-12-24 DIAGNOSIS — Z8673 Personal history of transient ischemic attack (TIA), and cerebral infarction without residual deficits: Secondary | ICD-10-CM

## 2012-12-24 DIAGNOSIS — D6859 Other primary thrombophilia: Secondary | ICD-10-CM

## 2012-12-24 LAB — POCT INR: INR: 2.2

## 2012-12-24 MED ORDER — WARFARIN SODIUM 5 MG PO TABS: ORAL_TABLET | ORAL | Status: DC

## 2012-12-24 NOTE — Progress Notes (Signed)
Anti-Coagulation Progress Note  Alexander English is a 57 y.o. male who is currently on an anti-coagulation regimen.    RECENT RESULTS: Recent results are below, the most recent result is correlated with a dose of 32.5 mg. per week: Lab Results  Component Value Date   INR 2.20 12/24/2012   INR 2.50 12/17/2012   INR 2.00 12/10/2012    ANTI-COAG DOSE: Anticoagulation Dose Instructions as of 12/24/2012     [name] Tue Wed Thu Fri Sat   New Dose 5 mg 5 mg 5 mg 2.5 mg 5 mg 5 mg 5 mg       ANTICOAG SUMMARY: Anticoagulation Episode Summary   Current INR goal 2.0-3.0  Next INR check 01/21/2013  INR from last check 2.20 (12/24/2012)  Weekly max dose   Target end date Indefinite  [facility] check location [facility]  Preferred lab   Send INR reminders to    Indications  HISTORY OF EMBOLIC STROKE WITH LEFT SIDED HEMIPARESIS [V12.49] Long term (current) use of anticoagulants [V58.61]        Comments         ANTICOAG TODAY: Anticoagulation Summary as of 12/24/2012   INR goal 2.0-3.0  Selected INR 2.20 (12/24/2012)  Next INR check 01/21/2013  Target end date Indefinite   Indications  HISTORY OF EMBOLIC STROKE WITH LEFT SIDED HEMIPARESIS [V12.49] Long term (current) use of anticoagulants [V58.61]      Anticoagulation Episode Summary   [facility] check location [facility]   Preferred lab    Send INR reminders to    Comments       PATIENT INSTRUCTIONS: Patient Instructions  Patient instructed to take medications as defined in the Anti-coagulation Track section of this encounter.  Patient instructed to take today's dose.  Patient verbalized understanding of these instructions.       FOLLOW-UP Return in 4 weeks (on 01/21/2013) for Follow up INR at 0930h.  Hulen Luster, III Pharm.D., CACP

## 2012-12-24 NOTE — Patient Instructions (Addendum)
Patient instructed to take medications as defined in the Anti-coagulation Track section of this encounter.  Patient instructed to take today's dose.  Patient verbalized understanding of these instructions.    

## 2013-01-17 ENCOUNTER — Encounter (HOSPITAL_COMMUNITY): Payer: Self-pay

## 2013-01-17 ENCOUNTER — Emergency Department (HOSPITAL_COMMUNITY)
Admission: EM | Admit: 2013-01-17 | Discharge: 2013-01-17 | Disposition: A | Discharge status: Home / Self Care | Payer: Medicare HMO | Attending: Emergency Medicine | Admitting: Emergency Medicine

## 2013-01-17 DIAGNOSIS — R040 Epistaxis: Secondary | ICD-10-CM

## 2013-01-17 DIAGNOSIS — Z8673 Personal history of transient ischemic attack (TIA), and cerebral infarction without residual deficits: Secondary | ICD-10-CM | POA: Insufficient documentation

## 2013-01-17 DIAGNOSIS — Z860100 Personal history of colon polyps, unspecified: Secondary | ICD-10-CM | POA: Insufficient documentation

## 2013-01-17 DIAGNOSIS — Z7982 Long term (current) use of aspirin: Secondary | ICD-10-CM | POA: Insufficient documentation

## 2013-01-17 DIAGNOSIS — Z79899 Other long term (current) drug therapy: Secondary | ICD-10-CM | POA: Insufficient documentation

## 2013-01-17 DIAGNOSIS — Z86711 Personal history of pulmonary embolism: Secondary | ICD-10-CM | POA: Insufficient documentation

## 2013-01-17 DIAGNOSIS — Z87891 Personal history of nicotine dependence: Secondary | ICD-10-CM | POA: Insufficient documentation

## 2013-01-17 DIAGNOSIS — Z7901 Long term (current) use of anticoagulants: Secondary | ICD-10-CM

## 2013-01-17 DIAGNOSIS — E785 Hyperlipidemia, unspecified: Secondary | ICD-10-CM | POA: Insufficient documentation

## 2013-01-17 LAB — CBC WITH DIFFERENTIAL/PLATELET
Basophils Absolute: 0 10*3/uL (ref 0.0–0.1)
Basophils Relative: 1 % (ref 0–1)
Eosinophils Absolute: 0.5 10*3/uL (ref 0.0–0.7)
Eosinophils Relative: 9 % — ABNORMAL HIGH (ref 0–5)
HCT: 40 % (ref 39.0–52.0)
Hemoglobin: 13.3 g/dL (ref 13.0–17.0)
Lymphocytes Relative: 54 % — ABNORMAL HIGH (ref 12–46)
Lymphs Abs: 3.2 10*3/uL (ref 0.7–4.0)
MCH: 27 pg (ref 26.0–34.0)
MCHC: 33.3 g/dL (ref 30.0–36.0)
MCV: 81.1 fL (ref 78.0–100.0)
Monocytes Absolute: 0.4 10*3/uL (ref 0.1–1.0)
Monocytes Relative: 6 % (ref 3–12)
Neutro Abs: 1.8 10*3/uL (ref 1.7–7.7)
Neutrophils Relative %: 30 % — ABNORMAL LOW (ref 43–77)
Platelets: 194 10*3/uL (ref 150–400)
RBC: 4.93 MIL/uL (ref 4.22–5.81)
RDW: 14.2 % (ref 11.5–15.5)
WBC: 5.9 10*3/uL (ref 4.0–10.5)

## 2013-01-17 LAB — PROTIME-INR
INR: 1.79 — ABNORMAL HIGH (ref 0.00–1.49)
Prothrombin Time: 20.3 seconds — ABNORMAL HIGH (ref 11.6–15.2)

## 2013-01-17 NOTE — ED Notes (Signed)
Pt woke up from soreness in nose, pt sts here for same in past and quick clot helped.

## 2013-01-17 NOTE — ED Provider Notes (Signed)
History    CSN: [id number] Arrival date & time 01/17/13  0340  First MD Initiated Contact with Patient 01/17/13 [phone]     Chief Complaint  Patient presents with  . Epistaxis   HPI  Alexander English is a 57 y.o. male history of CVA and PE who is on chronic anticoagulation therapy.  Patient woke up and had some soreness in the external septum of his nose, he stated he began bleeding. He says this was previously a pimple. Symptoms at that moderate to severe, ongoing, has not been able to stop the bleeding. No other complaints of bleeding. No dizziness, lightheadedness, chest pain, shortness of breath.   Past Medical History  Diagnosis Date  . Hyperlipidemia     Maintained on statin  . Stroke 2006    Occured after traveling from [place] to [place]. Was hospitalized in [place] but no W/U or tx. Returned to [place] unable to walk and was admitted to [facility] immediately after landing. Presumed embolic per neuro but TEE, bubble study, and hypercoag W/U negative.   . PE (pulmonary embolism) 2006    DX at [facility] after traveling to and from [place]. Indefinite warfarin.   . Tobacco abuse, in remission     Quit in 2006  . Hyperplastic colon polyp 2014    Non-neoplastic, colonoscopy/10years   Past Surgical History  Procedure Laterality Date  . Foot surgery    . I&d extremity  2003    Left thumb thenar space abscess.   Family History  Problem Relation Age of Onset  . Stroke Maternal Uncle   . Stroke Maternal Grandmother    History  Substance Use Topics  . Smoking status: Former Games developer  . Smokeless tobacco: Not on file  . Alcohol Use: No    Review of Systems At least 10pt or greater review of systems completed and are negative except where specified in the HPI.  Allergies  Review of patient's allergies indicates no known allergies.  Home Medications   Current Outpatient Rx  Name  Route  Sig  Dispense  Refill  . aspirin 81 MG tablet   Oral   Take 81 mg by mouth daily.         Marland Kitchen  atorvastatin (LIPITOR) 10 MG tablet   Oral   Take 1 tablet (10 mg total) by mouth daily.   30 tablet   11   . calcium-vitamin D (OSCAL WITH D) 500-200 MG-UNIT per tablet   Oral   Take 1 tablet by mouth daily.         Marland Kitchen GARLIC PO   Oral   Take 1 tablet by mouth daily.         . Multiple Vitamin (MULTIVITAMIN) tablet   Oral   Take 1 tablet by mouth daily.         Marland Kitchen warfarin (COUMADIN) 5 MG tablet   Oral   Take 2.5-5 mg by mouth daily. 2.5 mg on Wednesdays. 5 mg all other days.          BP 144/89  Pulse 84  Temp(Src) 98.9 F (37.2 C) (Oral)  Resp 17  SpO2 99% Physical Exam  Nursing notes reviewed.  Electronic medical record reviewed. VITAL SIGNS:   Filed Vitals:   01/17/13 0347 01/17/13 0533  BP: 151/92 144/89  Pulse: 88 84  Temp: 98.9 F (37.2 C)   TempSrc: Oral   Resp: 20 17  SpO2: 99% 99%   CONSTITUTIONAL: Awake, oriented, appears non-toxic HENT: Atraumatic, normocephalic, oral  mucosa pink and moist, airway patent. Nares patent without drainage. 2 mm lesion in between the naris actively bleeding - previously looks like closed comedone. External ears normal. EYES: Conjunctiva clear, EOMI, PERRLA NECK: Trachea midline, non-tender, supple CARDIOVASCULAR: Normal heart rate, Normal rhythm, No murmurs, rubs, gallops PULMONARY/CHEST: Clear to auscultation, no rhonchi, wheezes, or rales. Symmetrical breath sounds. Non-tender. ABDOMINAL: Non-distended, soft, non-tender - no rebound or guarding.  BS normal. NEUROLOGIC: Non-focal, moving all four extremities, no gross sensory or motor deficits. EXTREMITIES: No clubbing, cyanosis, or edema SKIN: Warm, Dry, No erythema, No rash  ED Course  Procedures (including critical care time) Labs Reviewed  PROTIME-INR - Abnormal; Notable for the following:    Prothrombin Time 20.3 (*)    INR 1.79 (*)    All other components within normal limits  CBC WITH DIFFERENTIAL - Abnormal; Notable for the following:    Neutrophils  Relative % 30 (*)    Lymphocytes Relative 54 (*)    Eosinophils Relative 9 (*)    All other components within normal limits   No results found. 1. Bleeding nose   2. Long term (current) use of anticoagulants     MDM  Alexander English is a 57 y.o. male with recurrent bleeding to the skin on the nose, he was seen last week for this, oxymetazoline was sprayed on the site and a quick clot cause was used to stop the bleeding. No anemia, is not supratherapeutic on his INR.  Return to the ER for worsening symptoms. Patient is sent home with oxymetazoline for epistaxis and given quick clot for future bleeding.  I explained the diagnosis and have given explicit precautions to return to the ER including worsening or uncontrolled bleeding or any other new or worsening symptoms. The patient understands and accepts the medical plan as it's been dictated and I have answered their questions. Discharge instructions concerning home care and prescriptions have been given.  The patient is STABLE and is discharged to home in good condition.   Jones Skene, MD 01/17/13 [phone]

## 2013-01-17 NOTE — ED Notes (Signed)
Pt instructed to hold pressure to bleeding for 5 minutes.

## 2013-01-17 NOTE — ED Notes (Signed)
Hemostatic gauze applied to bleeding. Pt instructed to hold direct pressure for 3 minutes.

## 2013-01-17 NOTE — ED Notes (Signed)
ENT cart at bedside

## 2013-01-21 ENCOUNTER — Ambulatory Visit (INDEPENDENT_AMBULATORY_CARE_PROVIDER_SITE_OTHER): Payer: Medicare HMO | Admitting: Pharmacist

## 2013-01-21 DIAGNOSIS — Z7901 Long term (current) use of anticoagulants: Secondary | ICD-10-CM

## 2013-01-21 DIAGNOSIS — D6859 Other primary thrombophilia: Secondary | ICD-10-CM

## 2013-01-21 DIAGNOSIS — Z8673 Personal history of transient ischemic attack (TIA), and cerebral infarction without residual deficits: Secondary | ICD-10-CM

## 2013-01-21 DIAGNOSIS — Z8669 Personal history of other diseases of the nervous system and sense organs: Secondary | ICD-10-CM

## 2013-01-21 DIAGNOSIS — I6990 Unspecified sequelae of unspecified cerebrovascular disease: Secondary | ICD-10-CM

## 2013-01-21 LAB — POCT INR: INR: 2.6

## 2013-01-21 NOTE — Progress Notes (Signed)
Anti-Coagulation Progress Note  Alexander English is a 57 y.o. male who is currently on an anti-coagulation regimen.    RECENT RESULTS: Recent results are below, the most recent result is correlated with a dose of 32.5 mg. per week: Lab Results  Component Value Date   INR 2.60 01/21/2013   INR 1.79* 01/17/2013   INR 2.20 12/24/2012    ANTI-COAG DOSE: Anticoagulation Dose Instructions as of 01/21/2013     [name] Tue Wed Thu Fri Sat   New Dose 5 mg 5 mg 5 mg 2.5 mg 5 mg 5 mg 5 mg       ANTICOAG SUMMARY: Anticoagulation Episode Summary   Current INR goal 2.0-3.0  Next INR check 02/18/2013  INR from last check 2.60 (01/21/2013)  Weekly max dose   Target end date Indefinite  [facility]  Preferred lab   Send INR reminders to    Indications  HISTORY OF EMBOLIC STROKE WITH LEFT SIDED HEMIPARESIS [V12.49] Long term (current) use of anticoagulants [V58.61]        Comments         ANTICOAG TODAY: Anticoagulation Summary as of 01/21/2013   INR goal 2.0-3.0  Selected INR 2.60 (01/21/2013)  Next INR check 02/18/2013  Target end date Indefinite   Indications  HISTORY OF EMBOLIC STROKE WITH LEFT SIDED HEMIPARESIS [V12.49] Long term (current) use of anticoagulants [V58.61]      Anticoagulation Episode Summary   [facility] check [facility]   Preferred lab    Send INR reminders to    Comments       PATIENT INSTRUCTIONS: Patient Instructions  Patient instructed to take medications as defined in the Anti-coagulation Track section of this encounter.  Patient instructed to take today's dose.  Patient verbalized understanding of these instructions.       FOLLOW-UP Return in 4 weeks (on 02/18/2013) for Follow up INR at 0900h.  Hulen Luster, III Pharm.D., CACP

## 2013-01-21 NOTE — Patient Instructions (Addendum)
Patient instructed to take medications as defined in the Anti-coagulation Track section of this encounter.  Patient instructed to take today's dose.  Patient verbalized understanding of these instructions.    

## 2013-02-18 ENCOUNTER — Ambulatory Visit (INDEPENDENT_AMBULATORY_CARE_PROVIDER_SITE_OTHER): Payer: Medicare HMO | Admitting: Pharmacist

## 2013-02-18 DIAGNOSIS — Z7901 Long term (current) use of anticoagulants: Secondary | ICD-10-CM

## 2013-02-18 DIAGNOSIS — D6859 Other primary thrombophilia: Secondary | ICD-10-CM

## 2013-02-18 DIAGNOSIS — Z8673 Personal history of transient ischemic attack (TIA), and cerebral infarction without residual deficits: Secondary | ICD-10-CM

## 2013-02-18 DIAGNOSIS — I6990 Unspecified sequelae of unspecified cerebrovascular disease: Secondary | ICD-10-CM

## 2013-02-18 DIAGNOSIS — Z8669 Personal history of other diseases of the nervous system and sense organs: Secondary | ICD-10-CM

## 2013-02-18 LAB — POCT INR: INR: 2.4

## 2013-02-18 NOTE — Progress Notes (Signed)
Anti-Coagulation Progress Note  Alexander English is a 57 y.o. male who is currently on an anti-coagulation regimen.    RECENT RESULTS: Recent results are below, the most recent result is correlated with a dose of 32.5 mg. per week: Lab Results  Component Value Date   INR 2.40 02/18/2013   INR 2.60 01/21/2013   INR 1.79* 01/17/2013    ANTI-COAG DOSE: Anticoagulation Dose Instructions as of 02/18/2013     [name] Tue Wed Thu Fri Sat   New Dose 5 mg 5 mg 5 mg 2.5 mg 5 mg 5 mg 5 mg       ANTICOAG SUMMARY: Anticoagulation Episode Summary   Current INR goal 2.0-3.0  Next INR check 03/18/2013  INR from last check 2.40 (02/18/2013)  Weekly max dose   Target end date Indefinite  [facility]  Preferred lab   Send INR reminders to    Indications  HISTORY OF EMBOLIC STROKE WITH LEFT SIDED HEMIPARESIS [V12.49] Long term (current) use of anticoagulants [V58.61]        Comments         ANTICOAG TODAY: Anticoagulation Summary as of 02/18/2013   INR goal 2.0-3.0  Selected INR 2.40 (02/18/2013)  Next INR check 03/18/2013  Target end date Indefinite   Indications  HISTORY OF EMBOLIC STROKE WITH LEFT SIDED HEMIPARESIS [V12.49] Long term (current) use of anticoagulants [V58.61]      Anticoagulation Episode Summary   [facility] check [facility]   Preferred lab    Send INR reminders to    Comments       PATIENT INSTRUCTIONS: Patient Instructions  Patient instructed to take medications as defined in the Anti-coagulation Track section of this encounter.  Patient instructed to take today's dose.  Patient verbalized understanding of these instructions.       FOLLOW-UP Return in 4 weeks (on 03/18/2013) for Follow up INR at 0915h.  Hulen Luster, III Pharm.D., CACP

## 2013-02-18 NOTE — Patient Instructions (Signed)
Patient instructed to take medications as defined in the Anti-coagulation Track section of this encounter.  Patient instructed to take today's dose.  Patient verbalized understanding of these instructions.    

## 2013-02-19 ENCOUNTER — Other Ambulatory Visit: Payer: Self-pay | Admitting: Internal Medicine

## 2013-02-19 DIAGNOSIS — Z0000 Encounter for general adult medical examination without abnormal findings: Secondary | ICD-10-CM

## 2013-02-27 ENCOUNTER — Telehealth: Payer: Self-pay | Admitting: *Deleted

## 2013-02-27 NOTE — Telephone Encounter (Signed)
Checked on Up-To-Date. Dental procedures including extraction are considered low risk. Pt going for dental cleaning. No coumadin interruption required.

## 2013-02-27 NOTE — Telephone Encounter (Signed)
Received a call from pt's Dentist stating pt was at there office for general cleaning.  Pt is on coumadin and they were waiting for paper work from [place] stating he did not need to be off coumadin for cleaning. Paperwork is here but Dr. Zada Girt not in clinic. Dr. Zada Girt pager and stated it was okay to proceed with general dental cleaning without stopping coumadin. We will have the paper work filled out today and fax to [facility] for further questions.

## 2013-03-18 ENCOUNTER — Ambulatory Visit (INDEPENDENT_AMBULATORY_CARE_PROVIDER_SITE_OTHER): Payer: Medicare HMO | Admitting: Pharmacist

## 2013-03-18 ENCOUNTER — Other Ambulatory Visit (INDEPENDENT_AMBULATORY_CARE_PROVIDER_SITE_OTHER): Payer: Medicare HMO

## 2013-03-18 DIAGNOSIS — Z8679 Personal history of other diseases of the circulatory system: Secondary | ICD-10-CM

## 2013-03-18 DIAGNOSIS — Z8669 Personal history of other diseases of the nervous system and sense organs: Secondary | ICD-10-CM

## 2013-03-18 DIAGNOSIS — Z8673 Personal history of transient ischemic attack (TIA), and cerebral infarction without residual deficits: Secondary | ICD-10-CM

## 2013-03-18 DIAGNOSIS — Z0000 Encounter for general adult medical examination without abnormal findings: Secondary | ICD-10-CM

## 2013-03-18 DIAGNOSIS — E7800 Pure hypercholesterolemia, unspecified: Secondary | ICD-10-CM

## 2013-03-18 DIAGNOSIS — Z7901 Long term (current) use of anticoagulants: Secondary | ICD-10-CM

## 2013-03-18 NOTE — Patient Instructions (Signed)
Patient instructed to take medications as defined in the Anti-coagulation Track section of this encounter.  Patient instructed to take today's dose.  Patient verbalized understanding of these instructions.    

## 2013-03-18 NOTE — Progress Notes (Signed)
Anti-Coagulation Progress Note  Alexander English is a 57 y.o. male who is currently on an anti-coagulation regimen.    RECENT RESULTS: Recent results are below, the most recent result is correlated with a dose of 32.5 mg. per week: Lab Results  Component Value Date   INR 2.40 02/18/2013   INR 2.60 01/21/2013   INR 1.79* 01/17/2013    ANTI-COAG DOSE: Anticoagulation Dose Instructions as of 03/18/2013     [name] Tue Wed Thu Fri Sat   New Dose 5 mg 5 mg 5 mg 5 mg 5 mg 5 mg 5 mg       ANTICOAG SUMMARY: Anticoagulation Episode Summary   Current INR goal 2.0-3.0  Next INR check 04/15/2013  INR from last check 2.40 (02/18/2013)  Weekly max dose   Target end date Indefinite  [facility]  Preferred lab   Send INR reminders to    Indications  HISTORY OF EMBOLIC STROKE WITH LEFT SIDED HEMIPARESIS [V12.49] Long term (current) use of anticoagulants [V58.61]        Comments         ANTICOAG TODAY: Anticoagulation Summary as of 03/18/2013   INR goal 2.0-3.0  Selected INR 2.40 (02/18/2013)  Next INR check 04/15/2013  Target end date Indefinite   Indications  HISTORY OF EMBOLIC STROKE WITH LEFT SIDED HEMIPARESIS [V12.49] Long term (current) use of anticoagulants [V58.61]      Anticoagulation Episode Summary   [facility] check [facility]   Preferred lab    Send INR reminders to    Comments       PATIENT INSTRUCTIONS: Patient Instructions  Patient instructed to take medications as defined in the Anti-coagulation Track section of this encounter.  Patient instructed to take today's dose.  Patient verbalized understanding of these instructions.       FOLLOW-UP Return in 4 weeks (on 04/15/2013) for Follow up INR at 0845h.  Hulen Luster, III Pharm.D., CACP

## 2013-03-19 LAB — GLUCOSE, FASTING: Glucose, Fasting: 100 mg/dL — ABNORMAL HIGH (ref 70–99)

## 2013-03-22 ENCOUNTER — Ambulatory Visit (INDEPENDENT_AMBULATORY_CARE_PROVIDER_SITE_OTHER): Payer: Medicare HMO | Admitting: *Deleted

## 2013-03-22 DIAGNOSIS — Z23 Encounter for immunization: Secondary | ICD-10-CM

## 2013-04-15 ENCOUNTER — Encounter: Payer: Self-pay | Admitting: Internal Medicine

## 2013-04-15 ENCOUNTER — Telehealth: Payer: Self-pay | Admitting: *Deleted

## 2013-04-15 ENCOUNTER — Ambulatory Visit (INDEPENDENT_AMBULATORY_CARE_PROVIDER_SITE_OTHER): Payer: Medicare HMO | Admitting: Pharmacist

## 2013-04-15 DIAGNOSIS — Z8669 Personal history of other diseases of the nervous system and sense organs: Secondary | ICD-10-CM

## 2013-04-15 DIAGNOSIS — Z7901 Long term (current) use of anticoagulants: Secondary | ICD-10-CM

## 2013-04-15 DIAGNOSIS — Z8673 Personal history of transient ischemic attack (TIA), and cerebral infarction without residual deficits: Secondary | ICD-10-CM

## 2013-04-15 LAB — POCT INR: INR: 2.9

## 2013-04-15 NOTE — Telephone Encounter (Addendum)
Pt was here to see Dr Alexandria Lodge - wanted to know test result of fasting glucose was is 100. Wanted to know why he wasn't call about abnormal value - I told pt I will have his doctor call and explain the result. He understood.

## 2013-04-15 NOTE — Telephone Encounter (Signed)
I called patient and left a voice mail message informing him that result of his fasting blood sugar was normal. He is not diabetic. I encouraged him to call if he still has questions. Thanks, Glenda.

## 2013-04-15 NOTE — Progress Notes (Signed)
Anti-Coagulation Progress Note  Alexander English is a 57 y.o. male who is currently on an anti-coagulation regimen.    RECENT RESULTS: Recent results are below, the most recent result is correlated with a dose of 35 mg. per week: Lab Results  Component Value Date   INR 2.90 04/15/2013   INR 2.40 02/18/2013   INR 2.60 01/21/2013    ANTI-COAG DOSE: Anticoagulation Dose Instructions as of 04/15/2013     [name] Tue Wed Thu Fri Sat   New Dose 5 mg 2.5 mg 5 mg 5 mg 5 mg 5 mg 5 mg       ANTICOAG SUMMARY: Anticoagulation Episode Summary   Current INR goal 2.0-3.0  Next INR check 05/13/2013  INR from last check 2.90 (04/15/2013)  Weekly max dose   Target end date Indefinite  [facility] location [facility]  Preferred lab   Send INR reminders to    Indications  HISTORY OF EMBOLIC STROKE WITH LEFT SIDED HEMIPARESIS [V12.49] Long term (current) use of anticoagulants [V58.61]        Comments         ANTICOAG TODAY: Anticoagulation Summary as of 04/15/2013   INR goal 2.0-3.0  Selected INR 2.90 (04/15/2013)  Next INR check 05/13/2013  Target end date Indefinite   Indications  HISTORY OF EMBOLIC STROKE WITH LEFT SIDED HEMIPARESIS [V12.49] Long term (current) use of anticoagulants [V58.61]      Anticoagulation Episode Summary   INR check location [facility]   Preferred lab    Send INR reminders to    Comments       PATIENT INSTRUCTIONS: Patient Instructions  Patient instructed to take medications as defined in the Anti-coagulation Track section of this encounter.  Patient instructed to take today's dose.  Patient verbalized understanding of these instructions.       FOLLOW-UP Return in 4 weeks (on 05/13/2013) for Follow up INR at 0900h.  Hulen Luster, III Pharm.D., CACP

## 2013-04-15 NOTE — Patient Instructions (Signed)
Patient instructed to take medications as defined in the Anti-coagulation Track section of this encounter.  Patient instructed to take today's dose.  Patient verbalized understanding of these instructions.    

## 2013-05-03 ENCOUNTER — Other Ambulatory Visit: Payer: Self-pay | Admitting: Internal Medicine

## 2013-05-13 ENCOUNTER — Ambulatory Visit: Payer: Medicare HMO

## 2013-05-13 ENCOUNTER — Ambulatory Visit (INDEPENDENT_AMBULATORY_CARE_PROVIDER_SITE_OTHER): Payer: Medicare HMO | Admitting: Pharmacist

## 2013-05-13 DIAGNOSIS — Z7901 Long term (current) use of anticoagulants: Secondary | ICD-10-CM

## 2013-05-13 DIAGNOSIS — Z8673 Personal history of transient ischemic attack (TIA), and cerebral infarction without residual deficits: Secondary | ICD-10-CM

## 2013-05-13 DIAGNOSIS — Z8669 Personal history of other diseases of the nervous system and sense organs: Secondary | ICD-10-CM

## 2013-05-13 LAB — POCT INR: INR: 2.2

## 2013-05-13 NOTE — Progress Notes (Signed)
Anti-Coagulation Progress Note  Alexander English is a 57 y.o. male who is currently on an anti-coagulation regimen.    RECENT RESULTS: Recent results are below, the most recent result is correlated with a dose of 32.5 mg. per week: Lab Results  Component Value Date   INR 2.20 05/13/2013   INR 2.90 04/15/2013   INR 2.40 02/18/2013    ANTI-COAG DOSE: Anticoagulation Dose Instructions as of 05/13/2013     [name] Tue Wed Thu Fri Sat   New Dose 5 mg 5 mg 5 mg 5 mg 5 mg 5 mg 5 mg       ANTICOAG SUMMARY: Anticoagulation Episode Summary   Current INR goal 2.0-3.0  Next INR check 06/10/2013  INR from last check 2.20 (05/13/2013)  Weekly max dose   Target end date Indefinite  [facility]  Preferred lab   Send INR reminders to    Indications  HISTORY OF EMBOLIC STROKE WITH LEFT SIDED HEMIPARESIS [V12.49] Long term (current) use of anticoagulants [V58.61]        Comments         ANTICOAG TODAY: Anticoagulation Summary as of 05/13/2013   INR goal 2.0-3.0  Selected INR 2.20 (05/13/2013)  Next INR check 06/10/2013  Target end date Indefinite   Indications  HISTORY OF EMBOLIC STROKE WITH LEFT SIDED HEMIPARESIS [V12.49] Long term (current) use of anticoagulants [V58.61]      Anticoagulation Episode Summary   [facility] check location [facility]   Preferred lab    Send INR reminders to    Comments       PATIENT INSTRUCTIONS: Patient Instructions  Patient instructed to take medications as defined in the Anti-coagulation Track section of this encounter.  Patient instructed to take today's dose.  Patient verbalized understanding of these instructions.       FOLLOW-UP Return in 4 weeks (on 06/10/2013) for Follow up INR at 1100h.  Hulen Luster, III Pharm.D., CACP

## 2013-05-13 NOTE — Patient Instructions (Signed)
Patient instructed to take medications as defined in the Anti-coagulation Track section of this encounter.  Patient instructed to take today's dose.  Patient verbalized understanding of these instructions.    

## 2013-06-10 ENCOUNTER — Other Ambulatory Visit: Payer: Self-pay | Admitting: Internal Medicine

## 2013-06-10 ENCOUNTER — Ambulatory Visit (INDEPENDENT_AMBULATORY_CARE_PROVIDER_SITE_OTHER): Payer: Medicare HMO | Admitting: Pharmacist

## 2013-06-10 DIAGNOSIS — Z7901 Long term (current) use of anticoagulants: Secondary | ICD-10-CM

## 2013-06-10 DIAGNOSIS — Z8669 Personal history of other diseases of the nervous system and sense organs: Secondary | ICD-10-CM

## 2013-06-10 DIAGNOSIS — Z8673 Personal history of transient ischemic attack (TIA), and cerebral infarction without residual deficits: Secondary | ICD-10-CM

## 2013-06-10 LAB — POCT INR
INR: 2.7
INR: 2.7

## 2013-06-10 MED ORDER — WARFARIN SODIUM 5 MG PO TABS: ORAL_TABLET | ORAL | Status: DC

## 2013-06-10 NOTE — Patient Instructions (Signed)
Patient instructed to take medications as defined in the Anti-coagulation Track section of this encounter.  Patient instructed to take today's dose.  Patient verbalized understanding of these instructions.    

## 2013-06-10 NOTE — Progress Notes (Signed)
Anti-Coagulation Progress Note  Alexander English is a 57 y.o. male who is currently on an anti-coagulation regimen.    RECENT RESULTS: Recent results are below, the most recent result is correlated with a dose of 35 mg. per week: Lab Results  Component Value Date   INR 2.70 06/10/2013   INR 2.70 06/10/2013   INR 2.20 05/13/2013   INR 2.90 04/15/2013    ANTI-COAG DOSE: Anticoagulation Dose Instructions as of 06/10/2013     [name] Tue Wed Thu Fri Sat   New Dose 5 mg 5 mg 5 mg 5 mg 5 mg 5 mg 5 mg       ANTICOAG SUMMARY: Anticoagulation Episode Summary   Current INR goal 2.0-3.0  Next INR check 07/08/2013  INR from last check 2.70 (06/10/2013)  Weekly max dose   Target end date Indefinite  [facility]  Preferred lab   Send INR reminders to    Indications  HISTORY OF EMBOLIC STROKE WITH LEFT SIDED HEMIPARESIS [V12.49] Long term (current) use of anticoagulants [V58.61]        Comments         ANTICOAG TODAY: Anticoagulation Summary as of 06/10/2013   INR goal 2.0-3.0  Selected INR 2.70 (06/10/2013)  Next INR check 07/08/2013  Target end date Indefinite   Indications  HISTORY OF EMBOLIC STROKE WITH LEFT SIDED HEMIPARESIS [V12.49] Long term (current) use of anticoagulants [V58.61]      Anticoagulation Episode Summary   [facility] check location [facility]   Preferred lab    Send INR reminders to    Comments       PATIENT INSTRUCTIONS: Patient Instructions  Patient instructed to take medications as defined in the Anti-coagulation Track section of this encounter.  Patient instructed to take today's dose.  Patient verbalized understanding of these instructions.       FOLLOW-UP Return in 4 weeks (on 07/08/2013) for Follow up INR at 0930h.  Hulen Luster, III Pharm.D., CACP

## 2013-06-12 ENCOUNTER — Ambulatory Visit (INDEPENDENT_AMBULATORY_CARE_PROVIDER_SITE_OTHER): Payer: Medicare HMO | Admitting: Internal Medicine

## 2013-06-12 ENCOUNTER — Encounter: Payer: Self-pay | Admitting: Internal Medicine

## 2013-06-12 VITALS — BP 137/84 | HR 95 | Temp 98.2°F | Ht 72.0 in | Wt 233.2 lb

## 2013-06-12 DIAGNOSIS — G8929 Other chronic pain: Secondary | ICD-10-CM

## 2013-06-12 DIAGNOSIS — E7800 Pure hypercholesterolemia, unspecified: Secondary | ICD-10-CM

## 2013-06-12 DIAGNOSIS — Z7901 Long term (current) use of anticoagulants: Secondary | ICD-10-CM

## 2013-06-12 DIAGNOSIS — K5909 Other constipation: Secondary | ICD-10-CM

## 2013-06-12 DIAGNOSIS — I2699 Other pulmonary embolism without acute cor pulmonale: Secondary | ICD-10-CM

## 2013-06-12 DIAGNOSIS — K5900 Constipation, unspecified: Secondary | ICD-10-CM

## 2013-06-12 DIAGNOSIS — IMO0002 Reserved for concepts with insufficient information to code with codable children: Secondary | ICD-10-CM

## 2013-06-12 DIAGNOSIS — Z8669 Personal history of other diseases of the nervous system and sense organs: Secondary | ICD-10-CM

## 2013-06-12 DIAGNOSIS — Z8673 Personal history of transient ischemic attack (TIA), and cerebral infarction without residual deficits: Secondary | ICD-10-CM

## 2013-06-12 DIAGNOSIS — M179 Osteoarthritis of knee, unspecified: Secondary | ICD-10-CM

## 2013-06-12 DIAGNOSIS — M545 Low back pain, unspecified: Secondary | ICD-10-CM

## 2013-06-12 DIAGNOSIS — M549 Dorsalgia, unspecified: Secondary | ICD-10-CM

## 2013-06-12 DIAGNOSIS — M1710 Unilateral primary osteoarthritis, unspecified knee: Secondary | ICD-10-CM

## 2013-06-12 DIAGNOSIS — Z0000 Encounter for general adult medical examination without abnormal findings: Secondary | ICD-10-CM

## 2013-06-12 HISTORY — DX: Other constipation: K59.09

## 2013-06-12 HISTORY — DX: Other chronic pain: G89.29

## 2013-06-12 HISTORY — DX: Low back pain, unspecified: M54.50

## 2013-06-12 LAB — BASIC METABOLIC PANEL WITH GFR
BUN: 15 mg/dL (ref 6–23)
CO2: 24 mEq/L (ref 19–32)
Calcium: 9.6 mg/dL (ref 8.4–10.5)
Chloride: 107 mEq/L (ref 96–112)
Creat: 0.9 mg/dL (ref 0.50–1.35)
GFR, Est African American: >89 mL/min
GFR, Est Non African American: >89 mL/min
Glucose, Bld: 113 mg/dL — ABNORMAL HIGH (ref 70–99)
Potassium: 3.9 mEq/L (ref 3.5–5.3)
Sodium: 140 mEq/L (ref 135–145)

## 2013-06-12 MED ORDER — PSYLLIUM 0.52 G PO CAPS: 0.5200 g | ORAL_CAPSULE | Freq: Every day | ORAL | Status: DC

## 2013-06-12 NOTE — Progress Notes (Signed)
Patient ID: Alexander English, male   DOB: Oct 03, 1955, 57 y.o.   MRN: [id number]   Subjective:   [name]   DOB: Jun 30, 1955 57 y.o.   MRN: [id number]  HPI: Alexander English is a 57 y.o. with a past medical history of a stroke in 2006 and a pulmonary embolism who is on indefinite Coumadin therapy, presents with 5 of low back pain.    Back Pain: Patient presents for presents evaluation of low back problems.  Symptoms have been present for 5 month and include pain in right side of his back. (aching and boring in character; 6/10 in severity). Initial inciting event: none. Symptoms are worst: When he stands up, when he lies in the bed. Alleviating factors identifiable by patient are sitting and remain still. Exacerbating factors identifiable by patient are none. Treatments so far initiated by patient: Patient reports that he has tried to change his mattress and bed, but these have not helped. He bought a new mattress on 05/15/2013. Previous lower back problems: none. Previous workup: Lumbar spine x-ray in 2009, which mild revealed arthropathy of the L4-L5 level.. Previous treatments: none. He denies neurological symptoms associated with the back pain.  Of note, since the patient's stroke 8 years ago, has been using a cane. His good leg is the right leg and he uses it for much of his support and ambulation. He admits that when he tries to stand up he puts much of his weight on the right leg due to weakness on the left.  Constipation: Patient reports that his bowel movements have been irregular over the last 2 months. He has tried Dulcolax and MiraLax without much improvement. Patient admits to low dietary intake of vegetables due to fear of increased vitamin K., which he believes might interfere with his Coumadin therapy. No blood in his stool. No vomiting. No abdominal pain.   Please see the A&P for the status of the pt's chronic medical problems.   Past Medical History   Diagnosis Date  . Hyperlipidemia     Maintained on statin  . Stroke 2006    Occured after traveling from [place] to [place]. Was hospitalized in [place] but no W/U or tx. Returned to [place] unable to walk and was admitted to [facility] immediately after landing. Presumed embolic per neuro but TEE, bubble study, and hypercoag W/U negative.   . PE (pulmonary embolism) 2006    DX at [facility] after traveling to and from [place]. Indefinite warfarin.   . Tobacco abuse, in remission     Quit in 2006  . Hyperplastic colon polyp 2014    Non-neoplastic, colonoscopy/10years   Current Outpatient Prescriptions  Medication Sig Dispense Refill  . atorvastatin (LIPITOR) 10 MG tablet Take 1 tablet (10 mg total) by mouth daily.  30 tablet  11  . calcium-vitamin D (OSCAL WITH D) 500-200 MG-UNIT per tablet Take 1 tablet by mouth daily.      Marland Kitchen GARLIC PO Take 1 tablet by mouth daily.      . Multiple Vitamin (MULTIVITAMIN) tablet Take 1 tablet by mouth daily.      Marland Kitchen warfarin (COUMADIN) 5 MG tablet Take as directed. Current dose: 1 tablet daily.  30 tablet  2  . psyllium (METAMUCIL) 0.52 G capsule Take 1 capsule (0.52 g total) by mouth daily.  60 capsule  1   No current facility-administered medications for this visit.   Family History  Problem Relation Age of Onset  . Stroke Maternal  Uncle   . Stroke Maternal Grandmother    History   Social History  . Marital Status: Divorced    Spouse Name: N/A    Number of Children: N/A  . Years of Education: N/A   Social History Main Topics  . Smoking status: Former Games developer  . Smokeless tobacco: None  . Alcohol Use: No  . Drug Use: No  . Sexual Activity: None   Other Topics Concern  . None   Social History Narrative  . None   Review of Systems: Review of Systems  Constitutional: Negative.   HENT: Negative.   Eyes: Negative.   Respiratory: Negative.   Cardiovascular: Negative.   Gastrointestinal: Negative.   Genitourinary: Negative.   Musculoskeletal: Negative.    Skin: Negative.   Neurological:       He has a long-standing weakness on the left side from a previous stroke. He is able to ambulate using a cane.  Endo/Heme/Allergies: Negative.   Psychiatric/Behavioral: Negative.     Objective:  Physical Exam: Filed Vitals:   06/12/13 1548  BP: 137/84  Pulse: 95  Temp: 98.2 F (36.8 C)  TempSrc: Oral  Height: 6' (1.829 m)  Weight: 233 lb 3.2 oz (105.779 kg)  SpO2: 99%   Physical Exam  Constitutional: He is oriented to person, place, and time and well-developed, well-nourished, and in no distress. No distress.  HENT:  Head: Normocephalic.  Eyes: EOM are normal.  Neck: Normal range of motion.  Cardiovascular: Normal rate, regular rhythm, normal heart sounds and intact distal pulses.  Exam reveals no friction rub.   No murmur heard. Pulmonary/Chest: Effort normal and breath sounds normal. No respiratory distress. He has no wheezes. He has no rales. He exhibits no tenderness.  Abdominal: Soft. Bowel sounds are normal.  Musculoskeletal: Normal range of motion. He exhibits no edema.       Right hip: He exhibits tenderness. He exhibits normal range of motion, normal strength, no swelling, no crepitus, no deformity and no laceration.       Lumbar back: He exhibits normal range of motion, no tenderness, no bony tenderness, no swelling, no edema, no deformity, no laceration, no pain, no spasm and normal pulse.  Neurological: He is alert and oriented to person, place, and time.  Left-sided hemiparesis. Straight leg raising sign on the right side is negative.  Skin: Skin is warm. He is not diaphoretic.  Psychiatric: Affect normal.     Assessment & Plan:  The assessment and plan for the care of this patient has been discussed with Dr. Dalphine Handing. Please see my problem based charting.

## 2013-06-12 NOTE — Patient Instructions (Signed)
I will refer you to physical therapy  If your pain persists or worsen, we can consider other options like x rays Please continue with Tylenol as needed for pain. Do no exceed 3g in a day Please take Metamucil for constipation Please follow up with me in 2 months Constipation, Adult Constipation is when a person:  Poops (bowel movement) less than 3 times a week.  Has a hard time pooping.  Has poop that is dry, hard, or bigger than normal. HOME CARE   Eat more fiber, such as fruits, vegetables, whole grains like brown rice, and beans.  Eat less fatty foods and sugar. This includes [place] fries, hamburgers, cookies, candy, and soda.  If you are not getting enough fiber from food, take products with added fiber in them (supplements).  Drink enough fluid to keep your pee (urine) clear or pale yellow.  Go to the restroom when you feel like you need to poop. Do not hold it.  Only take medicine as told by your doctor. Do not take medicines that help you poop (laxatives) without talking to your doctor first.  Exercise on a regular basis, or as told by your doctor. GET HELP RIGHT AWAY IF:   You have bright red blood in your poop (stool).  Your constipation lasts more than 4 days or gets worse.  You have belly (abdomen) or butt (rectal) pain.  You have thin poop (as thin as a pencil).  You lose weight, and it cannot be explained. MAKE SURE YOU:   Understand these instructions.  Will watch your condition.  Will get help right away if you are not doing well or get worse. Document Released: 11/30/2007 Document Revised: 09/05/2011 Document Reviewed: 05/17/2011 [facility] Patient Information 2014 [place], [place].

## 2013-06-12 NOTE — Assessment & Plan Note (Addendum)
Counseled the patient to increased fiber in his diet. I will prescribe OTC Metamucil. I provided him with information on home management of constipation.

## 2013-06-12 NOTE — Assessment & Plan Note (Addendum)
Assessment: Most likely diagnosis: lumbar strain on the right side in view of symptoms mainly being limited to the right side. Predisposing factors stroke with increased dependency on the right leg. Ddx: right hip OA. Lumbar spine x-ray in 2009, revealed L4/L5, arthropathy.   Plan: Labs/imaging: Discussed with the patient about the possibility of imaging. However, I indicated that imaging may not be warranted. Patient after long discussion, he opted to instead try physical therapy first and consider imaging with x-rays of his lumbar spine if needed in the future. There definitely no red flags on his exam or symptomatology. 1. Therapy: Referral to physical therapy. Tylenol as needed. 2. Follow up : 2 months

## 2013-06-13 NOTE — Progress Notes (Signed)
Case discussed with Dr.Kazibwe at the time of the visit.  We reviewed the resident's history and exam and pertinent patient test results.  I agree with the assessment, diagnosis, and plan of care documented in the resident's note.    

## 2013-06-25 ENCOUNTER — Ambulatory Visit: Payer: Medicare HMO

## 2013-07-08 ENCOUNTER — Ambulatory Visit: Payer: Medicare HMO

## 2013-07-22 ENCOUNTER — Ambulatory Visit (INDEPENDENT_AMBULATORY_CARE_PROVIDER_SITE_OTHER): Payer: Medicare HMO | Admitting: Pharmacist

## 2013-07-22 DIAGNOSIS — Z8669 Personal history of other diseases of the nervous system and sense organs: Secondary | ICD-10-CM

## 2013-07-22 DIAGNOSIS — Z7901 Long term (current) use of anticoagulants: Secondary | ICD-10-CM

## 2013-07-22 DIAGNOSIS — Z8673 Personal history of transient ischemic attack (TIA), and cerebral infarction without residual deficits: Secondary | ICD-10-CM

## 2013-07-22 LAB — POCT INR: INR: 2.6

## 2013-07-22 NOTE — Patient Instructions (Signed)
Patient instructed to take medications as defined in the Anti-coagulation Track section of this encounter.  Patient instructed to take today's dose.  Patient verbalized understanding of these instructions.    

## 2013-07-22 NOTE — Progress Notes (Signed)
Anti-Coagulation Progress Note  Alexander English is a 58 y.o. male who is currently on an anti-coagulation regimen.    RECENT RESULTS: Recent results are below, the most recent result is correlated with a dose of 35 mg. per week: Lab Results  Component Value Date   INR 2.60 07/22/2013   INR 2.70 06/10/2013   INR 2.70 06/10/2013   INR 2.20 05/13/2013    ANTI-COAG DOSE: Anticoagulation Dose Instructions as of 07/22/2013     [name] Tue Wed Thu Fri Sat   New Dose 5 mg 5 mg 5 mg 5 mg 5 mg 5 mg 5 mg       ANTICOAG SUMMARY: Anticoagulation Episode Summary   Current INR goal 2.0-3.0  Next INR check 08/19/2013  INR from last check 2.60 (07/22/2013)  Weekly max dose   Target end date Indefinite  [facility]  Preferred lab   Send INR reminders to    Indications  HISTORY OF EMBOLIC STROKE WITH LEFT SIDED HEMIPARESIS [V12.49] Long term (current) use of anticoagulants [V58.61]        Comments         ANTICOAG TODAY: Anticoagulation Summary as of 07/22/2013   INR goal 2.0-3.0  Selected INR 2.60 (07/22/2013)  Next INR check 08/19/2013  Target end date Indefinite   Indications  HISTORY OF EMBOLIC STROKE WITH LEFT SIDED HEMIPARESIS [V12.49] Long term (current) use of anticoagulants [V58.61]      Anticoagulation Episode Summary   [facility]   Preferred lab    Send INR reminders to    Comments       PATIENT INSTRUCTIONS: Patient Instructions  Patient instructed to take medications as defined in the Anti-coagulation Track section of this encounter.  Patient instructed to take today's dose.  Patient verbalized understanding of these instructions.       FOLLOW-UP Return in 4 weeks (on 08/19/2013) for Follow up INR at 0945h.  [name], III Pharm.D., CACP

## 2013-07-30 ENCOUNTER — Encounter: Payer: Self-pay | Admitting: Internal Medicine

## 2013-07-30 ENCOUNTER — Other Ambulatory Visit: Payer: Self-pay | Admitting: Internal Medicine

## 2013-07-31 ENCOUNTER — Encounter: Payer: Self-pay | Admitting: Internal Medicine

## 2013-07-31 ENCOUNTER — Ambulatory Visit (HOSPITAL_COMMUNITY)
Admission: RE | Admit: 2013-07-31 | Discharge: 2013-07-31 | Disposition: A | Discharge status: Home / Self Care | Payer: Medicare HMO | Source: Ambulatory Visit | Attending: Internal Medicine | Admitting: Internal Medicine

## 2013-07-31 ENCOUNTER — Ambulatory Visit (INDEPENDENT_AMBULATORY_CARE_PROVIDER_SITE_OTHER): Payer: Commercial Managed Care - HMO | Admitting: Internal Medicine

## 2013-07-31 VITALS — BP 137/87 | HR 65 | Temp 98.4°F | Ht 72.0 in | Wt 233.0 lb

## 2013-07-31 DIAGNOSIS — M549 Dorsalgia, unspecified: Secondary | ICD-10-CM

## 2013-07-31 DIAGNOSIS — K5900 Constipation, unspecified: Secondary | ICD-10-CM

## 2013-07-31 DIAGNOSIS — I63411 Cerebral infarction due to embolism of right middle cerebral artery: Secondary | ICD-10-CM

## 2013-07-31 DIAGNOSIS — I6340 Cerebral infarction due to embolism of unspecified cerebral artery: Secondary | ICD-10-CM

## 2013-07-31 DIAGNOSIS — M25559 Pain in unspecified hip: Secondary | ICD-10-CM | POA: Insufficient documentation

## 2013-07-31 DIAGNOSIS — I2699 Other pulmonary embolism without acute cor pulmonale: Secondary | ICD-10-CM

## 2013-07-31 DIAGNOSIS — E7800 Pure hypercholesterolemia, unspecified: Secondary | ICD-10-CM

## 2013-07-31 DIAGNOSIS — M1710 Unilateral primary osteoarthritis, unspecified knee: Secondary | ICD-10-CM

## 2013-07-31 DIAGNOSIS — Z7901 Long term (current) use of anticoagulants: Secondary | ICD-10-CM

## 2013-07-31 DIAGNOSIS — M179 Osteoarthritis of knee, unspecified: Secondary | ICD-10-CM

## 2013-07-31 DIAGNOSIS — M5137 Other intervertebral disc degeneration, lumbosacral region: Secondary | ICD-10-CM | POA: Insufficient documentation

## 2013-07-31 DIAGNOSIS — M51379 Other intervertebral disc degeneration, lumbosacral region without mention of lumbar back pain or lower extremity pain: Secondary | ICD-10-CM | POA: Insufficient documentation

## 2013-07-31 DIAGNOSIS — Z0000 Encounter for general adult medical examination without abnormal findings: Secondary | ICD-10-CM

## 2013-07-31 DIAGNOSIS — Z8673 Personal history of transient ischemic attack (TIA), and cerebral infarction without residual deficits: Secondary | ICD-10-CM

## 2013-07-31 MED ORDER — WARFARIN SODIUM 5 MG PO TABS: ORAL_TABLET | ORAL | Status: DC

## 2013-07-31 MED ORDER — ACETAMINOPHEN 325 MG PO TABS: 650.0000 mg | ORAL_TABLET | Freq: Three times a day (TID) | ORAL | Status: DC

## 2013-07-31 MED ORDER — ATORVASTATIN CALCIUM 10 MG PO TABS: 10.0000 mg | ORAL_TABLET | Freq: Every day | ORAL | Status: DC

## 2013-07-31 MED ORDER — WARFARIN SODIUM 5 MG PO TABS
ORAL_TABLET | ORAL | Status: DC
Start: 2013-07-31 — End: 2013-09-11

## 2013-07-31 NOTE — Assessment & Plan Note (Addendum)
Assessment: Most likely diagnosis: lumbar strain on the right side in view of symptoms mainly being limited to the right side. Predisposing factors stroke with increased dependency on the right leg. Ddx: right hip OA. Lumbar spine x-ray in 2009, revealed L4/L5, arthropathy. He was referred to outpatient PT on 06/12/2013 but he was unable to afford it  Plan: 1. Labs/imaging: pelvic and LS x-ray.  Explained to the patient that regardless of findings from imaging referral to some PT or Neuro-rehab is the best option. 2. Therapy: Referral to neuro-rehab for assessment. Cont with tylenol. He has been using 1-2 pills per week. I encouraged him to use Tylenol more consistently for benefit. 3. Follow up : 2 weeks and if pain is not controlled with tylenol, we will try tramadol. He is a good candidate for narcotics if needed. We can not use NSAIDs due to coumadin therapy

## 2013-07-31 NOTE — Patient Instructions (Signed)
Please take Tylenol 650 mg 2-3 times days a day and see that helps We will order x rays of your back and hips  Please come back in two weeks

## 2013-07-31 NOTE — Assessment & Plan Note (Signed)
Up to date on shots.  Check HIV today

## 2013-07-31 NOTE — Progress Notes (Signed)
Patient ID: Alexander English, male   DOB: 1956/03/08, 58 y.o.   MRN: [id number] Subjective:   [name]   DOB: May 17, 1956 58 y.o.   MRN: [id number]  HPI: Alexander English is a 58 y.o. with a past medical history of a stroke in 2006 and a pulmonary embolism who is on indefinite Coumadin therapy, presents for a follow up of his low back pain of 6 months.   Last OV 06/12/2013 where I referred him to outpatient PT. Unfortunately he was not able to afford a $45 co-pay for 3 x week visits. Requesting for imaging of his back. Symptoms of back pain have been present for 6 month and include pain in right side of his back. (aching and boring in character; 6/10 in severity). Initial inciting event: none. Symptoms are worst: When he stands up, when he lies in the bed and even when he turns. Alleviating factors identifiable by patient are sitting and remain still. Exacerbating factors identifiable by patient are none. Treatments so far initiated by patient: Patient reports that he has tried to change his mattress and bed, but these have not helped. He bought a new mattress on 05/15/2013. Previous lower back problems: none. Previous workup: Lumbar spine x-ray in 2009, which mild revealed arthropathy of the L4-L5 level. and I have ordered a repeat today (pelvic and lumbar x-rays).  He denies neurological symptoms associated with the back pain. Of note, since the patient's stroke 8 years ago, has been using a cane and relying on his good  right leg for much of his support and ambulation. He admits that when he tries to stand up he puts much of his weight on the right leg due to weakness on the left.  Please see the A&P for the status of the pt's chronic medical problems.   Past Medical History  Diagnosis Date  . Hyperlipidemia     Maintained on statin  . Stroke 2006    Occured after traveling from [place] to [place]. Was hospitalized in [place] but no W/U or tx. Returned to [place] unable  to walk and was admitted to [facility] immediately after landing. Presumed embolic per neuro but TEE, bubble study, and hypercoag W/U negative.   . PE (pulmonary embolism) 2006    DX at [facility] after traveling to and from [place]. Indefinite warfarin.   . Tobacco abuse, in remission     Quit in 2006  . Hyperplastic colon polyp 2014    Non-neoplastic, colonoscopy/10years   Current Outpatient Prescriptions  Medication Sig Dispense Refill  . acetaminophen (TYLENOL) 325 MG tablet Take 2 tablets (650 mg total) by mouth 3 (three) times daily between meals.  180 tablet  0  . atorvastatin (LIPITOR) 10 MG tablet Take 1 tablet (10 mg total) by mouth daily.  60 tablet  11  . calcium-vitamin D (OSCAL WITH D) 500-200 MG-UNIT per tablet Take 1 tablet by mouth daily.      Marland Kitchen GARLIC PO Take 1 tablet by mouth daily.      . Multiple Vitamin (MULTIVITAMIN) tablet Take 1 tablet by mouth daily.      . psyllium (METAMUCIL) 0.52 G capsule Take 1 capsule (0.52 g total) by mouth daily.  60 capsule  1  . warfarin (COUMADIN) 5 MG tablet Take as directed. Current dose: 1 tablet daily.  60 tablet  6   No current facility-administered medications for this visit.   Family History  Problem Relation Age of Onset  . Stroke  Maternal Uncle   . Stroke Maternal Grandmother    History   Social History  . Marital Status: Divorced    Spouse Name: N/A    Number of Children: N/A  . Years of Education: N/A   Social History Main Topics  . Smoking status: Former Research scientist (life sciences)  . Smokeless tobacco: None  . Alcohol Use: No  . Drug Use: No  . Sexual Activity: None   Other Topics Concern  . None   Social History Narrative  . None   Review of Systems: Review of Systems  Constitutional: Negative.   HENT: Negative.   Eyes: Negative.   Respiratory: Negative.   Cardiovascular: Negative.   Gastrointestinal: Negative.   Genitourinary: Negative.   Musculoskeletal: Negative.   Skin: Negative.   Neurological:       He has a  long-standing weakness on the left side from a previous stroke. He is able to ambulate using a cane.  Endo/Heme/Allergies: Negative.   Psychiatric/Behavioral: Negative.     Objective:  Physical Exam: Filed Vitals:   07/31/13 1445  BP: 137/87  Pulse: 65  Temp: 98.4 F (36.9 C)  TempSrc: Oral  Height: 6' (1.829 m)  Weight: 233 lb (105.688 kg)  SpO2: 100%   Physical Exam  Constitutional: He is oriented to person, place, and time and well-developed, well-nourished, and in no distress. No distress.  HENT:  Head: Normocephalic.  Eyes: EOM are normal.  Neck: Normal range of motion.  Cardiovascular: Normal rate, regular rhythm, normal heart sounds and intact distal pulses.  Exam reveals no friction rub.   No murmur heard. Pulmonary/Chest: Effort normal and breath sounds normal. No respiratory distress. He has no wheezes. He has no rales. He exhibits no tenderness.  Abdominal: Soft. Bowel sounds are normal.  Musculoskeletal: Normal range of motion. He exhibits no edema.       Right hip: He exhibits tenderness. He exhibits normal range of motion, normal strength, no swelling, no crepitus, no deformity and no laceration.       Lumbar back: He exhibits normal range of motion, no tenderness, no bony tenderness, no swelling, no edema, no deformity, no laceration, no pain, no spasm and normal pulse.  Neurological: He is alert and oriented to person, place, and time.  Left-sided hemiparesis. Straight leg raising sign on the right side is negative.  Skin: Skin is warm. He is not diaphoretic.  Psychiatric: Affect normal.     Assessment & Plan:  The assessment and plan for the care of this patient has been discussed with Dr. Lynnae January. Please see my problem based charting.

## 2013-08-01 LAB — HIV ANTIBODY (ROUTINE TESTING W REFLEX): HIV: NONREACTIVE

## 2013-08-01 NOTE — Progress Notes (Signed)
Case discussed with Dr. Kazibwe soon after the resident saw the patient.  We reviewed the resident's history and exam and pertinent patient test results.  I agree with the assessment, diagnosis, and plan of care documented in the resident's note. 

## 2013-08-14 ENCOUNTER — Encounter: Payer: Medicare HMO | Admitting: Internal Medicine

## 2013-08-19 ENCOUNTER — Ambulatory Visit (INDEPENDENT_AMBULATORY_CARE_PROVIDER_SITE_OTHER): Payer: Medicare HMO | Admitting: Pharmacist

## 2013-08-19 DIAGNOSIS — Z7901 Long term (current) use of anticoagulants: Secondary | ICD-10-CM

## 2013-08-19 DIAGNOSIS — Z8673 Personal history of transient ischemic attack (TIA), and cerebral infarction without residual deficits: Secondary | ICD-10-CM

## 2013-08-19 DIAGNOSIS — Z8669 Personal history of other diseases of the nervous system and sense organs: Secondary | ICD-10-CM

## 2013-08-19 LAB — POCT INR: INR: 2.7

## 2013-08-19 NOTE — Progress Notes (Signed)
Anti-Coagulation Progress Note  Alexander English is a 58 y.o. male who is currently on an anti-coagulation regimen.    RECENT RESULTS: Recent results are below, the most recent result is correlated with a dose of 35 mg. per week: Lab Results  Component Value Date   INR 2.70 08/19/2013   INR 2.60 07/22/2013   INR 2.70 06/10/2013   INR 2.70 06/10/2013    ANTI-COAG DOSE: Anticoagulation Dose Instructions as of 08/19/2013     [name] Tue Wed Thu Fri Sat   New Dose 5 mg 5 mg 5 mg 5 mg 5 mg 5 mg 5 mg       ANTICOAG SUMMARY: Anticoagulation Episode Summary   Current INR goal 2.0-3.0  Next INR check 09/16/2013  INR from last check 2.70 (08/19/2013)  Weekly max dose   Target end date Indefinite  [facility]  Preferred lab   Send INR reminders to    Indications  HISTORY OF EMBOLIC STROKE WITH LEFT SIDED HEMIPARESIS [V12.49] Long term (current) use of anticoagulants [V58.61]        Comments         ANTICOAG TODAY: Anticoagulation Summary as of 08/19/2013   INR goal 2.0-3.0  Selected INR 2.70 (08/19/2013)  Next INR check 09/16/2013  Target end date Indefinite   Indications  HISTORY OF EMBOLIC STROKE WITH LEFT SIDED HEMIPARESIS [V12.49] Long term (current) use of anticoagulants [V58.61]      Anticoagulation Episode Summary   [facility]   Preferred lab    Send INR reminders to    Comments       PATIENT INSTRUCTIONS: Patient Instructions  Patient instructed to take medications as defined in the Anti-coagulation Track section of this encounter.  Patient instructed to take today's dose.  Patient verbalized understanding of these instructions.       FOLLOW-UP Return in about 4 weeks (around 09/16/2013) for Follow up INR at 0945h.  [name], III Pharm.D., CACP

## 2013-08-19 NOTE — Patient Instructions (Signed)
Patient instructed to take medications as defined in the Anti-coagulation Track section of this encounter.  Patient instructed to take today's dose.  Patient verbalized understanding of these instructions.    

## 2013-08-28 ENCOUNTER — Ambulatory Visit (INDEPENDENT_AMBULATORY_CARE_PROVIDER_SITE_OTHER): Payer: Medicare HMO | Admitting: Internal Medicine

## 2013-08-28 ENCOUNTER — Encounter: Payer: Self-pay | Admitting: Internal Medicine

## 2013-08-28 VITALS — BP 122/80 | HR 80 | Temp 97.8°F | Ht 72.0 in | Wt 232.8 lb

## 2013-08-28 DIAGNOSIS — Z7901 Long term (current) use of anticoagulants: Secondary | ICD-10-CM

## 2013-08-28 DIAGNOSIS — Z8673 Personal history of transient ischemic attack (TIA), and cerebral infarction without residual deficits: Secondary | ICD-10-CM

## 2013-08-28 DIAGNOSIS — Z8669 Personal history of other diseases of the nervous system and sense organs: Secondary | ICD-10-CM

## 2013-08-28 DIAGNOSIS — M549 Dorsalgia, unspecified: Secondary | ICD-10-CM

## 2013-08-28 MED ORDER — MELOXICAM 7.5 MG PO TABS: 7.5000 mg | ORAL_TABLET | Freq: Every day | ORAL | Status: DC

## 2013-08-28 NOTE — Progress Notes (Signed)
Case discussed with Dr. Kazibwe soon after the resident saw the patient.  We reviewed the resident's history and exam and pertinent patient test results.  I agree with the assessment, diagnosis and plan of care documented in the resident's note. 

## 2013-08-28 NOTE — Assessment & Plan Note (Signed)
Check INR on 09/04/2013 with a new prescription of Mobic.

## 2013-08-28 NOTE — Patient Instructions (Addendum)
Please take Mobic 7.5 mg to 15 mg daily Please stop taking Tylenol  If the pain does not respond, we will try other options  We will check your INR in one week. Please come on 09/04/2013 to the lab. The lab is open between 9am to 4pm, there are closed 12p-1p for lunch.  Otherwise, Please come back on two weeks to see you feel better.

## 2013-08-28 NOTE — Assessment & Plan Note (Signed)
Assessment: Most likely diagnosis: abnormal gait related lumbar strain on the right side in view of symptoms mainly being limited to the right side. Predisposing factors stroke with increased dependency on the right leg. Bilateral pelvic x-ray revealed mild bilateral hip joint degenerative disease. His severe symptoms do not correlate with imaging findings. Lumbar spine x-ray in 2009, revealed L4/L5, arthropathy , but again repeat on 07/31/2013 was basically normal. History and physical examination findings do not suggest a vesicular necrosis.  Plan: 1. Labs/imaging: none today  2. Therapy: Has not gone for his appointment at neuro- rehabilitation. I will change his Tylenol to Mobic 7.5 mg to 15 mg daily. If his symptoms do not improve with the Mobic, trial another nonsteroidal anti-inflammatory analgesic can be considered. 3. Follow up : 2 weeks to evaluate for response to therapy. I have also ordered INR next week on 09/04/2013 given new prescription of Mobic.

## 2013-08-28 NOTE — Progress Notes (Signed)
Patient ID: Alexander English, male   DOB: Nov 17, 1955, 58 y.o.   MRN: [id number] Subjective:   [name]   DOB: 20-Jan-1956 58 y.o.   MRN: [id number]  HPI: [name] is a 58 y.o. with a past medical history of a stroke in 2006 and a pulmonary embolism who is on indefinite Coumadin therapy, presents for a follow up of his low back pain of 7 months.   Last OV 07/31/2013 where LS x ray - normal and  pelvis x-ray revealed bilateral mild degenerative joint diease. He reports that his pain a little worse and he does not get much relief from Tylenol. No new neurologic symptoms for now.   Please see the A&P for the status of the pt's chronic medical problems.   Past Medical History  Diagnosis Date  . Hyperlipidemia     Maintained on statin  . Stroke 2006    Occured after traveling from [place] to [place]. Was hospitalized in [place] but no W/U or tx. Returned to [place] unable to walk and was admitted to [facility] immediately after landing. Presumed embolic per neuro but TEE, bubble study, and hypercoag W/U negative.   . PE (pulmonary embolism) 2006    DX at [facility] after traveling to and from [place]. Indefinite warfarin.   . Tobacco abuse, in remission     Quit in 2006  . Hyperplastic colon polyp 2014    Non-neoplastic, colonoscopy/10years   Current Outpatient Prescriptions  Medication Sig Dispense Refill  . atorvastatin (LIPITOR) 10 MG tablet Take 1 tablet (10 mg total) by mouth daily.  60 tablet  11  . calcium-vitamin D (OSCAL WITH D) 500-200 MG-UNIT per tablet Take 1 tablet by mouth daily.      Marland Kitchen GARLIC PO Take 1 tablet by mouth daily.      . Multiple Vitamin (MULTIVITAMIN) tablet Take 1 tablet by mouth daily.      . psyllium (METAMUCIL) 0.52 G capsule Take 1 capsule (0.52 g total) by mouth daily.  60 capsule  1  . warfarin (COUMADIN) 5 MG tablet TAKE ONE TABLET BY MOUTH ONCE DAILY ON MONDAY, TUESDAY, THURSDAY, FRIDAY, SATURDAY, SUNDAY AND TAKE ONE-HALF TABLET BY MOUTH  ON WEDNESDAY.  30 tablet  0  . warfarin (COUMADIN) 5 MG tablet Take as directed. Current dose: 1 tablet daily.  60 tablet  6  . meloxicam (MOBIC) 7.5 MG tablet Take 1-2 tablets (7.5-15 mg total) by mouth daily.  30 tablet  0   No current facility-administered medications for this visit.   Family History  Problem Relation Age of Onset  . Stroke Maternal Uncle   . Stroke Maternal Grandmother    History   Social History  . Marital Status: Divorced    Spouse Name: N/A    Number of Children: N/A  . Years of Education: N/A   Social History Main Topics  . Smoking status: Former Research scientist (life sciences)  . Smokeless tobacco: None  . Alcohol Use: No  . Drug Use: No  . Sexual Activity: None   Other Topics Concern  . None   Social History Narrative  . None   Review of Systems: Review of Systems  Constitutional: Negative.   HENT: Negative.   Eyes: Negative.   Respiratory: Negative.   Cardiovascular: Negative.   Gastrointestinal: Negative.   Genitourinary: Negative.   Musculoskeletal: Negative.   Skin: Negative.   Neurological:       He has a long-standing weakness on the left side from  a previous stroke. He is able to ambulate using a cane.  Endo/Heme/Allergies: Negative.   Psychiatric/Behavioral: Negative.     Objective:  Physical Exam: Filed Vitals:   08/28/13 1437  BP: 122/80  Pulse: 80  Temp: 97.8 F (36.6 C)  TempSrc: Oral  Height: 6' (1.829 m)  Weight: 232 lb 12.8 oz (105.597 kg)  SpO2: 98%   Physical Exam  Constitutional: He is oriented to person, place, and time and well-developed, well-nourished, and in no distress. No distress.  HENT:  Head: Normocephalic.  Eyes: EOM are normal.  Neck: Normal range of motion.  Cardiovascular: Normal rate, regular rhythm, normal heart sounds and intact distal pulses.  Exam reveals no friction rub.   No murmur heard. Pulmonary/Chest: Effort normal and breath sounds normal. No respiratory distress. He has no wheezes. He has no rales. He  exhibits no tenderness.  Abdominal: Soft. Bowel sounds are normal.  Musculoskeletal: Normal range of motion. He exhibits no edema.       Right hip: He exhibits tenderness. He exhibits normal range of motion, normal strength, no swelling, no crepitus, no deformity and no laceration.       Lumbar back: He exhibits normal range of motion, no tenderness, no bony tenderness, no swelling, no edema, no deformity, no laceration, no pain, no spasm and normal pulse.  Neurological: He is alert and oriented to person, place, and time.  Left-sided hemiparesis. Straight leg raising sign on the right side is negative.  Skin: Skin is warm. He is not diaphoretic.  Psychiatric: Affect normal.     Assessment & Plan:  The assessment and plan for the care of this patient has been discussed with Dr. Eppie Gibson. Please see my problem based charting.

## 2013-08-29 ENCOUNTER — Encounter: Payer: Self-pay | Admitting: *Deleted

## 2013-08-29 ENCOUNTER — Telehealth: Payer: Self-pay | Admitting: *Deleted

## 2013-08-30 NOTE — Telephone Encounter (Signed)
Telephone encounter opened in error. Yvonna Alanis, RN, 08/30/13- 9:24 AM

## 2013-09-04 ENCOUNTER — Other Ambulatory Visit (INDEPENDENT_AMBULATORY_CARE_PROVIDER_SITE_OTHER): Payer: Medicare HMO

## 2013-09-04 ENCOUNTER — Telehealth: Payer: Self-pay | Admitting: *Deleted

## 2013-09-04 DIAGNOSIS — Z8673 Personal history of transient ischemic attack (TIA), and cerebral infarction without residual deficits: Secondary | ICD-10-CM

## 2013-09-04 DIAGNOSIS — Z7901 Long term (current) use of anticoagulants: Secondary | ICD-10-CM

## 2013-09-04 LAB — POCT INR: INR: 2.3

## 2013-09-04 NOTE — Telephone Encounter (Signed)
Traceyf.- lab- came in triage w/ pt's INR for today, it was 2.3. He was last in coumadin clinic 2/23 and INR 2.70. At pt's visit to clinic 3/4 w/ dr Alice Rieger pt was placed on mobic and ask to come back today for an INR.Marland Kitchen Please advise. i am sending this to Dr. Eppie Gibson, Dr. Alice Rieger and attending

## 2013-09-04 NOTE — Telephone Encounter (Signed)
Discussed with the patient about the results from the INR today. He is concerned about side effects of Mobic which he read from the insert in the medication package. Reassured him to take Mobic for his pain until his next appt with me on 3/18. He verbalized understanding. He also has a physical therapy appt coming up soon.

## 2013-09-04 NOTE — Telephone Encounter (Signed)
Spoke w/ dr Ellwood Dense, she ask that pt take mobic sparingly, remind him of bleeding precautions and keep upcoming appt w/ dr Alice Rieger and dr Elie Confer. He is agreeable and would like to stop the mobic- states that the pharmacist warned him and he read the inserts that came from pharmacy for mobic

## 2013-09-10 ENCOUNTER — Ambulatory Visit: Payer: Medicare HMO | Attending: Internal Medicine

## 2013-09-10 DIAGNOSIS — IMO0001 Reserved for inherently not codable concepts without codable children: Secondary | ICD-10-CM | POA: Insufficient documentation

## 2013-09-10 DIAGNOSIS — M549 Dorsalgia, unspecified: Secondary | ICD-10-CM | POA: Insufficient documentation

## 2013-09-11 ENCOUNTER — Ambulatory Visit (INDEPENDENT_AMBULATORY_CARE_PROVIDER_SITE_OTHER): Payer: Medicare HMO | Admitting: Internal Medicine

## 2013-09-11 ENCOUNTER — Encounter: Payer: Self-pay | Admitting: Internal Medicine

## 2013-09-11 VITALS — BP 117/74 | HR 62 | Temp 97.0°F | Ht 72.0 in | Wt 234.0 lb

## 2013-09-11 DIAGNOSIS — Z8673 Personal history of transient ischemic attack (TIA), and cerebral infarction without residual deficits: Secondary | ICD-10-CM

## 2013-09-11 DIAGNOSIS — Z86718 Personal history of other venous thrombosis and embolism: Secondary | ICD-10-CM

## 2013-09-11 DIAGNOSIS — M549 Dorsalgia, unspecified: Secondary | ICD-10-CM

## 2013-09-11 LAB — POCT INR: INR: 2.7

## 2013-09-11 MED ORDER — MELOXICAM 7.5 MG PO TABS: 7.5000 mg | ORAL_TABLET | Freq: Every day | ORAL | Status: DC

## 2013-09-11 MED ORDER — TIZANIDINE HCL 2 MG PO TABS: 2.0000 mg | ORAL_TABLET | Freq: Three times a day (TID) | ORAL | Status: DC | PRN

## 2013-09-11 NOTE — Patient Instructions (Signed)
We will try Zanaflex in addition to Mobic and see how that helps Please continue with Mobic 1-2 pills daily  Please continue with physical therapy  Please come back in one month.  Tizanidine tablets or capsules What is this medicine? TIZANIDINE (tye ZAN i deen) helps to relieve muscle spasms. It may be used to help in the treatment of multiple sclerosis and spinal cord injury. This medicine may be used for other purposes; ask your health care provider or pharmacist if you have questions. COMMON BRAND NAME(S): Zanaflex What should I tell my health care provider before I take this medicine? They need to know if you have any of these conditions: -kidney disease -liver disease -low blood pressure -mental disorder -an unusual or allergic reaction to tizanidine, other medicines, lactose (tablets only), foods, dyes, or preservatives -pregnant or trying to get pregnant -breast-feeding How should I use this medicine? Take this medicine by mouth with a full glass of water. Take this medicine on an empty stomach, at least 30 minutes before or 2 hours after food. Do not take with food unless you talk with your doctor. Follow the directions on the prescription label. Take your medicine at regular intervals. Do not take your medicine more often than directed. Do not stop taking except on your doctor's advice. Suddenly stopping the medicine can be very dangerous. Talk to your pediatrician regarding the use of this medicine in children. Patients over 80 years old may have a stronger reaction and need a smaller dose. Overdosage: If you think you have taken too much of this medicine contact a poison control center or emergency room at once. NOTE: This medicine is only for you. Do not share this medicine with others. What if I miss a dose? If you miss a dose, take it as soon as you can. If it is almost time for your next dose, take only that dose. Do not take double or extra doses. What may interact with  this medicine? Do not take this medicine with any of the following medications: -ciprofloxacin -clonidine -fluvoxamine -guanabenz -guanfacine -methyldopa This medicine may also interact with the following medications: -acyclovir -alcohol -antihistamines -baclofen -barbiturates like phenobarbital -benzodiazepines -cimetidine -famotidine -male hormones, like estrogens or progestins and birth control pills -medicines for high blood pressure -medicines for irregular heartbeat -medicines for pain like codeine, morphine, and hydrocodone -medicines for sleep -rofecoxib -some antibiotics like levofloxacin, ofloxacin -ticlopidine -zileuton This list may not describe all possible interactions. Give your health care provider a list of all the medicines, herbs, non-prescription drugs, or dietary supplements you use. Also tell them if you smoke, drink alcohol, or use illegal drugs. Some items may interact with your medicine. What should I watch for while using this medicine? You may get drowsy or dizzy. Do not drive, use machinery, or do anything that needs mental alertness until you know how this medicine affects you. Do not stand or sit up quickly, especially if you are an older patient. This reduces the risk of dizzy or fainting spells. Alcohol may interfere with the effect of this medicine. Avoid alcoholic drinks. Your mouth may get dry. Chewing sugarless gum or sucking hard candy, and drinking plenty of water may help. Contact your doctor if the problem does not go away or is severe. What side effects may I notice from receiving this medicine? Side effects that you should report to your doctor or health care professional as soon as possible: -allergic reactions like skin rash, itching or hives, swelling of the face,  lips, or tongue -blurred vision -fainting spells -hallucinations -nausea or vomiting -nervousness -redness, blistering, peeling or loosening of the skin, including inside  the mouth -slow or irregular heartbeat, palpitations, or chest pain -yellowing of the skin or eyes Side effects that usually do not require medical attention (report to your doctor or health care professional if they continue or are bothersome): -dizziness -drowsiness -dry mouth -tiredness or weakness This list may not describe all possible side effects. Call your doctor for medical advice about side effects. You may report side effects to FDA at 1-800-FDA-1088. Where should I keep my medicine? Keep out of the reach of children. Store at room temperature between 15 and 30 degrees C (59 and 86 degrees F). Throw away any unused medicine after the expiration date. NOTE: This sheet is a summary. It may not cover all possible information. If you have questions about this medicine, talk to your doctor, pharmacist, or health care provider.  2014, Elsevier/Gold Standard. (2008-02-28 12:38:02)

## 2013-09-11 NOTE — Progress Notes (Signed)
Subjective:   [name]   DOB: Jan 09, 1956 59 y.o.   MRN: [id number]  HPI: Alexander English is a 58 y.o. with a past medical history of a stroke in 2006 and a pulmonary embolism who is on indefinite Coumadin therapy, presents for a follow up of his low back pain of 8 months.   Last OV 08/28/2013 where I started him on Mobic. Patient was very hesitant using this medication after reading the medication insert with multiple side effects that were mentioned particularly strokes and MI. I discussed the side effects of this medication in detail with the patient and he agrees to continue taking it at least in the short-term. His INR has been closely monitored and has remained normal since starting his Mobic. In terms of his pain, he reports that the Mobic 7.5 mg once daily has helped reduce the severity from 10/10 to 7/10. He also started physical therapy recently. No new symptoms since his last visit.  Please see the A&P for the status of the pt's chronic medical problems.   Past Medical History  Diagnosis Date  . Hyperlipidemia     Maintained on statin  . Stroke 2006    Occured after traveling from [place] to [place]. Was hospitalized in [place] but no W/U or tx. Returned to [place] unable to walk and was admitted to [facility] immediately after landing. Presumed embolic per neuro but TEE, bubble study, and hypercoag W/U negative.   . PE (pulmonary embolism) 2006    DX at [facility] after traveling to and from [place]. Indefinite warfarin.   . Tobacco abuse, in remission     Quit in 2006  . Hyperplastic colon polyp 2014    Non-neoplastic, colonoscopy/10years   Current Outpatient Prescriptions  Medication Sig Dispense Refill  . aspirin EC 81 MG tablet Take 81 mg by mouth daily.      Marland Kitchen atorvastatin (LIPITOR) 10 MG tablet Take 1 tablet (10 mg total) by mouth daily.  60 tablet  11  . calcium-vitamin D (OSCAL WITH D) 500-200 MG-UNIT per tablet Take 1 tablet by mouth daily.      Marland Kitchen GARLIC  PO Take 1 tablet by mouth daily.      . meloxicam (MOBIC) 7.5 MG tablet Take 1-2 tablets (7.5-15 mg total) by mouth daily.  30 tablet  0  . Multiple Vitamin (MULTIVITAMIN) tablet Take 1 tablet by mouth daily.      Marland Kitchen warfarin (COUMADIN) 5 MG tablet TAKE ONE TABLET BY MOUTH ONCE DAILY ON MONDAY, TUESDAY, THURSDAY, FRIDAY, SATURDAY, SUNDAY AND TAKE ONE-HALF TABLET BY MOUTH ON WEDNESDAY.  30 tablet  0  . tiZANidine (ZANAFLEX) 2 MG tablet Take 1 tablet (2 mg total) by mouth every 8 (eight) hours as needed for muscle spasms.  30 tablet  0   No current facility-administered medications for this visit.   Family History  Problem Relation Age of Onset  . Stroke Maternal Uncle   . Stroke Maternal Grandmother    History   Social History  . Marital Status: Divorced    Spouse Name: N/A    Number of Children: N/A  . Years of Education: N/A   Social History Main Topics  . Smoking status: Former Research scientist (life sciences)  . Smokeless tobacco: None  . Alcohol Use: No  . Drug Use: No  . Sexual Activity: None   Other Topics Concern  . None   Social History Narrative  . None   Review of Systems: Review of Systems  Constitutional: Negative.   HENT: Negative.   Eyes: Negative.   Respiratory: Negative.   Cardiovascular: Negative.   Gastrointestinal: Negative.   Genitourinary: Negative.   Musculoskeletal: Negative.   Skin: Negative.   Neurological:       He has a long-standing weakness on the left side from a previous stroke. He is able to ambulate using a cane.  Endo/Heme/Allergies: Negative.   Psychiatric/Behavioral: Negative.     Objective:  Physical Exam: Filed Vitals:   09/11/13 1437  BP: 117/74  Pulse: 62  Temp: 97 F (36.1 C)  TempSrc: Oral  Height: 6' (1.829 m)  Weight: 234 lb (106.142 kg)  SpO2: 98%   Physical Exam  Constitutional: He is oriented to person, place, and time and well-developed, well-nourished, and in no distress. No distress.  HENT:  Head: Normocephalic.  Eyes: EOM are  normal.  Neck: Normal range of motion.  Cardiovascular: Normal rate, regular rhythm, normal heart sounds and intact distal pulses.  Exam reveals no friction rub.   No murmur heard. Pulmonary/Chest: Effort normal and breath sounds normal. No respiratory distress. He has no wheezes. He has no rales. He exhibits no tenderness.  Abdominal: Soft. Bowel sounds are normal.  Musculoskeletal: Normal range of motion. He exhibits no edema.       Right hip: He exhibits tenderness. He exhibits normal range of motion, normal strength, no swelling, no crepitus, no deformity and no laceration.       Lumbar back: He exhibits normal range of motion, no tenderness, no bony tenderness, no swelling, no edema, no deformity, no laceration, no pain, no spasm and normal pulse.  Neurological: He is alert and oriented to person, place, and time.  Left-sided hemiparesis. Straight leg raising sign on the right side is negative.  Skin: Skin is warm. He is not diaphoretic.  Psychiatric: Affect normal.     Assessment & Plan:  The assessment and plan for the care of this patient has been discussed with Dr. Eppie Gibson. Please see my problem based charting.

## 2013-09-11 NOTE — Assessment & Plan Note (Addendum)
Assessment: Most likely diagnosis: abnormal gait related lumbar strain on the right side in view of symptoms mainly being limited to the right side. Predisposing factors stroke with increased dependency on the right leg. Bilateral pelvic x-ray revealed mild bilateral hip joint degenerative disease. His severe symptoms do not correlate with imaging findings. Lumbar spine x-ray in 2009, revealed L4/L5, arthropathy , but again repeat on 07/31/2013 was basically normal. History and physical examination findings do not suggest AVN. His recent use of MOBIC as somehow relieve the pain from a 10 to 7. The side effects. INR has remained stable. Patient recently started physical therapy.  Plan: 1. Labs/imaging: INR >>>2.7 2. Therapy: Continue with Mobic. Has 10 pills of 7.5 mg at home. Will add another 30 pills. This is a short term therapy, and hopefully, his physical therapy will help with the pain in which case a Mobic can be discontinued. I discussed in detail about the side effects of Mobic , including the risk of GI bleeding, increased risk of stroke, and MI. Patient also reveals that he is taking aspirin 81 mg daily. The patient agrees to continue taking this medication. I also discussed other options including tramadol, and possibly opiates if his pain persists. Patient would like to continue with the Mobic before trial of opioids. If he develops this GI side effects, addition of PPI will be considered. His INR today is 2.7. Follow up : In 1 month

## 2013-09-12 NOTE — Addendum Note (Signed)
Addended by: Jessee Avers on: 09/12/2013 01:38 PM   Modules accepted: Level of Service

## 2013-09-12 NOTE — Progress Notes (Signed)
Case discussed with Dr. Kazibwe soon after the resident saw the patient.  We reviewed the resident's history and exam and pertinent patient test results.  I agree with the assessment, diagnosis and plan of care documented in the resident's note. 

## 2013-09-16 ENCOUNTER — Ambulatory Visit: Payer: Medicare HMO

## 2013-09-30 ENCOUNTER — Ambulatory Visit: Payer: Medicare HMO | Attending: Internal Medicine | Admitting: Physical Therapy

## 2013-09-30 ENCOUNTER — Ambulatory Visit (INDEPENDENT_AMBULATORY_CARE_PROVIDER_SITE_OTHER): Payer: Medicare HMO | Admitting: Pharmacist

## 2013-09-30 DIAGNOSIS — Z8669 Personal history of other diseases of the nervous system and sense organs: Secondary | ICD-10-CM

## 2013-09-30 DIAGNOSIS — Z8673 Personal history of transient ischemic attack (TIA), and cerebral infarction without residual deficits: Secondary | ICD-10-CM

## 2013-09-30 DIAGNOSIS — M549 Dorsalgia, unspecified: Secondary | ICD-10-CM | POA: Insufficient documentation

## 2013-09-30 DIAGNOSIS — Z7901 Long term (current) use of anticoagulants: Secondary | ICD-10-CM

## 2013-09-30 DIAGNOSIS — IMO0001 Reserved for inherently not codable concepts without codable children: Secondary | ICD-10-CM | POA: Insufficient documentation

## 2013-09-30 LAB — POCT INR: INR: 2.7

## 2013-09-30 NOTE — Progress Notes (Signed)
Anti-Coagulation Progress Note  Alexander English is a 58 y.o. male who is currently on an anti-coagulation regimen.    RECENT RESULTS: Recent results are below, the most recent result is correlated with a dose of 35 mg. per week: Lab Results  Component Value Date   INR 2.70 09/30/2013   INR 2.7 09/11/2013   INR 2.3 09/04/2013    ANTI-COAG DOSE: Anticoagulation Dose Instructions as of 09/30/2013     [name] Tue Wed Thu Fri Sat   New Dose 5 mg 5 mg 5 mg 5 mg 5 mg 5 mg 5 mg       ANTICOAG SUMMARY: Anticoagulation Episode Summary   Current INR goal 2.0-3.0  Next INR check 10/28/2013  INR from last check 2.70 (09/30/2013)  Weekly max dose   Target end date Indefinite  [facility] check [facility]  Preferred lab   Send INR reminders to    Indications  HISTORY OF EMBOLIC STROKE WITH LEFT SIDED HEMIPARESIS [V12.49] Long term (current) use of anticoagulants [V58.61]        Comments         ANTICOAG TODAY: Anticoagulation Summary as of 09/30/2013   INR goal 2.0-3.0  Selected INR 2.70 (09/30/2013)  Next INR check 10/28/2013  Target end date Indefinite   Indications  HISTORY OF EMBOLIC STROKE WITH LEFT SIDED HEMIPARESIS [V12.49] Long term (current) use of anticoagulants [V58.61]      Anticoagulation Episode Summary   [facility] check [facility]   Preferred lab    Send INR reminders to    Comments       PATIENT INSTRUCTIONS: Patient Instructions  Patient instructed to take medications as defined in the Anti-coagulation Track section of this encounter.  Patient instructed to take today's dose.  Patient verbalized understanding of these instructions.       FOLLOW-UP Return in 4 weeks (on 10/28/2013) for Follow up INR at 0915h.  [name], III Pharm.D., CACP

## 2013-09-30 NOTE — Progress Notes (Signed)
Indication: stroke in 2006 and a pulmonary embolism. Duration: indefinite Coumadin therapy. INR: at target. I agree with Dr. Gladstone Pih assessment and plan.

## 2013-09-30 NOTE — Patient Instructions (Signed)
Patient instructed to take medications as defined in the Anti-coagulation Track section of this encounter.  Patient instructed to take today's dose.  Patient verbalized understanding of these instructions.    

## 2013-10-02 ENCOUNTER — Ambulatory Visit: Payer: Medicare HMO

## 2013-10-07 ENCOUNTER — Ambulatory Visit: Payer: Medicare HMO

## 2013-10-14 ENCOUNTER — Encounter: Payer: Medicare HMO | Admitting: Physical Therapy

## 2013-10-16 ENCOUNTER — Ambulatory Visit: Payer: Medicare HMO

## 2013-10-28 ENCOUNTER — Ambulatory Visit: Payer: Medicare HMO

## 2013-10-30 ENCOUNTER — Encounter: Payer: Medicare HMO | Admitting: Rehabilitation

## 2013-11-04 ENCOUNTER — Ambulatory Visit (INDEPENDENT_AMBULATORY_CARE_PROVIDER_SITE_OTHER): Payer: Medicare HMO | Admitting: Pharmacist

## 2013-11-04 DIAGNOSIS — Z8669 Personal history of other diseases of the nervous system and sense organs: Secondary | ICD-10-CM

## 2013-11-04 DIAGNOSIS — Z8673 Personal history of transient ischemic attack (TIA), and cerebral infarction without residual deficits: Secondary | ICD-10-CM

## 2013-11-04 DIAGNOSIS — Z7901 Long term (current) use of anticoagulants: Secondary | ICD-10-CM

## 2013-11-04 LAB — POCT INR: INR: 2.2

## 2013-11-04 NOTE — Progress Notes (Signed)
Anti-Coagulation Progress Note  Alexander English is a 58 y.o. male who is currently on an anti-coagulation regimen.    RECENT RESULTS: Recent results are below, the most recent result is correlated with a dose of 35 mg. per week: Lab Results  Component Value Date   INR 2.20 11/04/2013   INR 2.70 09/30/2013   INR 2.7 09/11/2013    ANTI-COAG DOSE: Anticoagulation Dose Instructions as of 11/04/2013     [name] Tue Wed Thu Fri Sat   New Dose 5 mg 7.5 mg 5 mg 5 mg 7.5 mg 5 mg 5 mg       ANTICOAG SUMMARY: Anticoagulation Episode Summary   Current INR goal 2.0-3.0  Next INR check 12/02/2013  INR from last check 2.20 (11/04/2013)  Weekly max dose   Target end date Indefinite  [facility]  Preferred lab   Send INR reminders to    Indications  HISTORY OF EMBOLIC STROKE WITH LEFT SIDED HEMIPARESIS [V12.49] Long term (current) use of anticoagulants [V58.61]        Comments         ANTICOAG TODAY: Anticoagulation Summary as of 11/04/2013   INR goal 2.0-3.0  Selected INR 2.20 (11/04/2013)  Next INR check 12/02/2013  Target end date Indefinite   Indications  HISTORY OF EMBOLIC STROKE WITH LEFT SIDED HEMIPARESIS [V12.49] Long term (current) use of anticoagulants [V58.61]      Anticoagulation Episode Summary   [facility] check [facility]   Preferred lab    Send INR reminders to    Comments       PATIENT INSTRUCTIONS: Patient Instructions  Patient instructed to take medications as defined in the Anti-coagulation Track section of this encounter.  Patient instructed to take today's dose.  Patient verbalized understanding of these instructions.       FOLLOW-UP Return in 4 weeks (on 12/02/2013) for Follow up INR at 0930h.  [name], III Pharm.D., CACP

## 2013-11-04 NOTE — Patient Instructions (Signed)
Patient instructed to take medications as defined in the Anti-coagulation Track section of this encounter.  Patient instructed to take today's dose.  Patient verbalized understanding of these instructions.    

## 2013-11-05 ENCOUNTER — Other Ambulatory Visit: Payer: Self-pay | Admitting: Internal Medicine

## 2013-11-05 DIAGNOSIS — M170 Bilateral primary osteoarthritis of knee: Secondary | ICD-10-CM

## 2013-12-02 ENCOUNTER — Ambulatory Visit (INDEPENDENT_AMBULATORY_CARE_PROVIDER_SITE_OTHER): Payer: Medicare HMO | Admitting: Pharmacist

## 2013-12-02 DIAGNOSIS — Z8669 Personal history of other diseases of the nervous system and sense organs: Secondary | ICD-10-CM

## 2013-12-02 DIAGNOSIS — Z8673 Personal history of transient ischemic attack (TIA), and cerebral infarction without residual deficits: Secondary | ICD-10-CM

## 2013-12-02 DIAGNOSIS — Z7901 Long term (current) use of anticoagulants: Secondary | ICD-10-CM

## 2013-12-02 LAB — POCT INR: INR: 2.5

## 2013-12-02 NOTE — Patient Instructions (Signed)
Patient instructed to take medications as defined in the Anti-coagulation Track section of this encounter.  Patient instructed to take today's dose.  Patient verbalized understanding of these instructions.    

## 2013-12-02 NOTE — Progress Notes (Signed)
Anti-Coagulation Progress Note  Alexander English is a 58 y.o. male who is currently on an anti-coagulation regimen.    RECENT RESULTS: Recent results are below, the most recent result is correlated with a dose of 40 mg. per week: Lab Results  Component Value Date   INR 2.50 12/02/2013   INR 2.20 11/04/2013   INR 2.70 09/30/2013    ANTI-COAG DOSE: Anticoagulation Dose Instructions as of 12/02/2013     [name] Tue Wed Thu Fri Sat   New Dose 5 mg 7.5 mg 5 mg 5 mg 7.5 mg 5 mg 5 mg       ANTICOAG SUMMARY: Anticoagulation Episode Summary   Current INR goal 2.0-3.0  Next INR check 12/30/2013  INR from last check 2.50 (12/02/2013)  Weekly max dose   Target end date Indefinite  [facility]  Preferred lab   Send INR reminders to    Indications  HISTORY OF EMBOLIC STROKE WITH LEFT SIDED HEMIPARESIS [V12.49] Long term (current) use of anticoagulants [V58.61]        Comments         ANTICOAG TODAY: Anticoagulation Summary as of 12/02/2013   INR goal 2.0-3.0  Selected INR 2.50 (12/02/2013)  Next INR check 12/30/2013  Target end date Indefinite   Indications  HISTORY OF EMBOLIC STROKE WITH LEFT SIDED HEMIPARESIS [V12.49] Long term (current) use of anticoagulants [V58.61]      Anticoagulation Episode Summary   [facility]   Preferred lab    Send INR reminders to    Comments       PATIENT INSTRUCTIONS: Patient Instructions  Patient instructed to take medications as defined in the Anti-coagulation Track section of this encounter.  Patient instructed to take today's dose.  Patient verbalized understanding of these instructions.       FOLLOW-UP Return in 4 weeks (on 12/30/2013) for Follow up INR at 0900h.  [name], III Pharm.D., CACP

## 2013-12-05 ENCOUNTER — Ambulatory Visit (INDEPENDENT_AMBULATORY_CARE_PROVIDER_SITE_OTHER): Payer: Medicare HMO | Admitting: Internal Medicine

## 2013-12-05 ENCOUNTER — Encounter: Payer: Self-pay | Admitting: Internal Medicine

## 2013-12-05 VITALS — BP 122/76 | HR 78 | Temp 97.3°F | Wt 233.0 lb

## 2013-12-05 DIAGNOSIS — I2699 Other pulmonary embolism without acute cor pulmonale: Secondary | ICD-10-CM

## 2013-12-05 DIAGNOSIS — M545 Low back pain, unspecified: Secondary | ICD-10-CM

## 2013-12-05 DIAGNOSIS — I63411 Cerebral infarction due to embolism of right middle cerebral artery: Secondary | ICD-10-CM

## 2013-12-05 DIAGNOSIS — E669 Obesity, unspecified: Secondary | ICD-10-CM

## 2013-12-05 DIAGNOSIS — Z0000 Encounter for general adult medical examination without abnormal findings: Secondary | ICD-10-CM

## 2013-12-05 DIAGNOSIS — G8929 Other chronic pain: Secondary | ICD-10-CM

## 2013-12-05 DIAGNOSIS — I6340 Cerebral infarction due to embolism of unspecified cerebral artery: Secondary | ICD-10-CM

## 2013-12-05 DIAGNOSIS — M170 Bilateral primary osteoarthritis of knee: Secondary | ICD-10-CM

## 2013-12-05 DIAGNOSIS — IMO0002 Reserved for concepts with insufficient information to code with codable children: Secondary | ICD-10-CM

## 2013-12-05 DIAGNOSIS — M1710 Unilateral primary osteoarthritis, unspecified knee: Secondary | ICD-10-CM

## 2013-12-05 LAB — POCT GLYCOSYLATED HEMOGLOBIN (HGB A1C): Hemoglobin A1C: 5.7

## 2013-12-05 NOTE — Patient Instructions (Addendum)
It was nice to meet you.  I look forward to taking care of you for years to come.  1) Keep taking the medications as you are.  We stopped the vitamin D-calcium as I am not sure you are benefiting from it.  2) Voltaren gel dose is 4 grams (g).  Use the dosing card.  If you have questions about this please call me and we will figure out a better way to dose the medication.  3) I checked a diabetes test for you. I will let you know the result.  4) I will see you back in 1 year, sooner if necessary.

## 2013-12-06 ENCOUNTER — Ambulatory Visit: Payer: Commercial Managed Care - HMO | Admitting: Internal Medicine

## 2013-12-07 ENCOUNTER — Encounter: Payer: Self-pay | Admitting: Internal Medicine

## 2013-12-07 DIAGNOSIS — M25511 Pain in right shoulder: Secondary | ICD-10-CM

## 2013-12-07 DIAGNOSIS — G8929 Other chronic pain: Secondary | ICD-10-CM

## 2013-12-07 DIAGNOSIS — E66811 Obesity, class 1: Secondary | ICD-10-CM

## 2013-12-07 DIAGNOSIS — R7611 Nonspecific reaction to tuberculin skin test without active tuberculosis: Secondary | ICD-10-CM

## 2013-12-07 DIAGNOSIS — E669 Obesity, unspecified: Secondary | ICD-10-CM

## 2013-12-07 DIAGNOSIS — M25512 Pain in left shoulder: Secondary | ICD-10-CM

## 2013-12-07 HISTORY — DX: Nonspecific reaction to tuberculin skin test without active tuberculosis: R76.11

## 2013-12-07 HISTORY — DX: Other chronic pain: G89.29

## 2013-12-07 HISTORY — DX: Obesity, unspecified: E66.9

## 2013-12-07 HISTORY — DX: Obesity, class 1: E66.811

## 2013-12-07 NOTE — Assessment & Plan Note (Signed)
He has had elevated glucoses in the past, but this is apparently while chewing gum, so technically they are not fasting.  He is obese, therefore we will check a Hgb A1C to screen for diabetes.  We discussed decreased caloric intake for the obesity since exercise is difficult.  Unfortunately, diet will also be difficult given his warfarin therapy and need to avoid greens.  We will continue to encourage a lower calorie diet.

## 2013-12-07 NOTE — Assessment & Plan Note (Signed)
No symptoms consistent with recurrent PE.  On warfarin and tolerating well.  He has made an informed decision to remain on lifelong anticoagulation with warfarin.

## 2013-12-07 NOTE — Progress Notes (Signed)
   Subjective:    Patient ID: Alexander English, male    DOB: [birth year], 58 y.o.   MRN: [id number]  HPI  Please see the A&P for the status of the pt's chronic medical problems.  Review of Systems  Constitutional: Negative for activity change, appetite change, fatigue and unexpected weight change.  Respiratory: Negative for cough, chest tightness, shortness of breath and wheezing.   Cardiovascular: Negative for chest pain, palpitations and leg swelling.  Gastrointestinal: Positive for constipation and abdominal distention. Negative for nausea, vomiting, abdominal pain and diarrhea.  Musculoskeletal: Positive for arthralgias, back pain and gait problem. Negative for joint swelling, myalgias, neck pain and neck stiffness.  Skin: Negative for color change, rash and wound.  Allergic/Immunologic: Negative for environmental allergies.  Neurological: Positive for weakness. Negative for seizures, light-headedness and headaches.  Psychiatric/Behavioral: Negative for dysphoric mood. The patient is not nervous/anxious.       Objective:   Physical Exam  Nursing note and vitals reviewed. Constitutional: He is oriented to person, place, and time. He appears well-developed and well-nourished. No distress.  HENT:  Head: Normocephalic and atraumatic.  Cardiovascular: Normal rate, regular rhythm and normal heart sounds.  Exam reveals no gallop and no friction rub.   No murmur heard. Pulmonary/Chest: Effort normal and breath sounds normal. No respiratory distress. He has no wheezes. He has no rales.  Abdominal: Soft. Bowel sounds are normal. He exhibits no distension. There is no tenderness. There is no rebound and no guarding.  Musculoskeletal: Normal range of motion. He exhibits no edema and no tenderness.  Neurological: He is alert and oriented to person, place, and time. He exhibits abnormal muscle tone. Coordination and gait abnormal.  Weakness of the left arm and leg.  Uses a cane for ambulation.   Skin: Skin is warm and dry. No rash noted. He is not diaphoretic. No erythema.  Psychiatric: He has a normal mood and affect. His behavior is normal. Judgment and thought content normal.      Assessment & Plan:   Please see problem oriented charting.

## 2013-12-07 NOTE — Assessment & Plan Note (Signed)
We discussed the risks and benefits of meloxicam.  He does get some relief with it on a PRN basis and wishes to continue its use.  He is aware it can cause GI lining irritation and places him at increased risk for GI bleeding. If this becomes overwhelmingly concerning to him he will let me know and we can start PPI prophylaxis.

## 2013-12-07 NOTE — Assessment & Plan Note (Signed)
Left sided residual symptoms are stable.  Continue ASA 81 mg daily and empiric atorvastatin.  As CVA is presumed embolic, will also continue liefelong anticoagulation with warfarin.

## 2013-12-07 NOTE — Assessment & Plan Note (Signed)
His has Voltaren gel at home and wishes to restart this for his bilateral knee DJD.  We discussed the risks given the concomitant warfarin therapy, but both felt that if it is effective, the topical route would be best.  He apparently does not get sufficient relief with the meloxicam alone.  He will use his dosing card to apply Voltaren gel 4 grams topically as needed for knee pain.

## 2013-12-07 NOTE — Assessment & Plan Note (Signed)
He is up to date on his preventative health maintenance.

## 2013-12-30 ENCOUNTER — Other Ambulatory Visit: Payer: Self-pay | Admitting: Internal Medicine

## 2013-12-30 ENCOUNTER — Ambulatory Visit (INDEPENDENT_AMBULATORY_CARE_PROVIDER_SITE_OTHER): Payer: Commercial Managed Care - HMO | Admitting: Pharmacist

## 2013-12-30 DIAGNOSIS — I2699 Other pulmonary embolism without acute cor pulmonale: Secondary | ICD-10-CM

## 2013-12-30 DIAGNOSIS — Z8673 Personal history of transient ischemic attack (TIA), and cerebral infarction without residual deficits: Secondary | ICD-10-CM

## 2013-12-30 DIAGNOSIS — Z8669 Personal history of other diseases of the nervous system and sense organs: Secondary | ICD-10-CM

## 2013-12-30 DIAGNOSIS — Z7901 Long term (current) use of anticoagulants: Secondary | ICD-10-CM

## 2013-12-30 LAB — POCT INR: INR: 2.9

## 2013-12-30 NOTE — Patient Instructions (Signed)
Patient instructed to take medications as defined in the Anti-coagulation Track section of this encounter.  Patient instructed to take today's dose.  Patient verbalized understanding of these instructions.    

## 2013-12-30 NOTE — Progress Notes (Signed)
Anti-Coagulation Progress Note  Alexander English is a 58 y.o. male who is currently on an anti-coagulation regimen.    RECENT RESULTS: Recent results are below, the most recent result is correlated with a dose of 40 mg. per week: Lab Results  Component Value Date   INR 2.90 12/30/2013   INR 2.50 12/02/2013   INR 2.20 11/04/2013    ANTI-COAG DOSE: Anticoagulation Dose Instructions as of 12/30/2013     [name] Tue Wed Thu Fri Sat   New Dose 5 mg 5 mg 5 mg 7.5 mg 5 mg 5 mg 5 mg       ANTICOAG SUMMARY: Anticoagulation Episode Summary   Current INR goal 2.0-3.0  Next INR check 01/20/2014  INR from last check 2.90 (12/30/2013)  Weekly max dose   Target end date Indefinite  [facility] location [facility]  Preferred lab   Send INR reminders to    Indications  Pulmonary embolism [415.19]        Comments         ANTICOAG TODAY: Anticoagulation Summary as of 12/30/2013   INR goal 2.0-3.0  Selected INR 2.90 (12/30/2013)  Next INR check 01/20/2014  Target end date Indefinite   Indications  Pulmonary embolism [415.19]      Anticoagulation Episode Summary   [facility] check location [facility]   Preferred lab    Send INR reminders to    Comments       PATIENT INSTRUCTIONS: Patient Instructions  Patient instructed to take medications as defined in the Anti-coagulation Track section of this encounter.  Patient instructed to take today's dose.  Patient verbalized understanding of these instructions.       FOLLOW-UP Return in 3 weeks (on 01/20/2014) for Follow up INR at 0845h.  [name], III Pharm.D., CACP

## 2014-01-02 NOTE — Progress Notes (Signed)
Alexander English is on coumadin for PE.  I have reviewed Dr. Gladstone Pih note.

## 2014-01-20 ENCOUNTER — Ambulatory Visit (INDEPENDENT_AMBULATORY_CARE_PROVIDER_SITE_OTHER): Payer: Commercial Managed Care - HMO | Admitting: Pharmacist

## 2014-01-20 DIAGNOSIS — Z7901 Long term (current) use of anticoagulants: Secondary | ICD-10-CM

## 2014-01-20 DIAGNOSIS — I2699 Other pulmonary embolism without acute cor pulmonale: Secondary | ICD-10-CM

## 2014-01-20 DIAGNOSIS — Z8673 Personal history of transient ischemic attack (TIA), and cerebral infarction without residual deficits: Secondary | ICD-10-CM

## 2014-01-20 DIAGNOSIS — Z8669 Personal history of other diseases of the nervous system and sense organs: Secondary | ICD-10-CM

## 2014-01-20 LAB — POCT INR: INR: 2.7

## 2014-01-20 NOTE — Progress Notes (Signed)
Anti-Coagulation Progress Note  Alexander English is a 58 y.o. male who is currently on an anti-coagulation regimen.    RECENT RESULTS: Recent results are below, the most recent result is correlated with a dose of 37.5 mg. per week: Lab Results  Component Value Date   INR 2.70 01/20/2014   INR 2.90 12/30/2013   INR 2.50 12/02/2013    ANTI-COAG DOSE: Anticoagulation Dose Instructions as of 01/20/2014     [name] Tue Wed Thu Fri Sat   New Dose 5 mg 5 mg 5 mg 7.5 mg 5 mg 5 mg 5 mg       ANTICOAG SUMMARY: Anticoagulation Episode Summary   Current INR goal 2.0-3.0  Next INR check 02/17/2014  INR from last check 2.70 (01/20/2014)  Weekly max dose   Target end date Indefinite  [facility] check location [facility]  Preferred lab   Send INR reminders to    Indications  Pulmonary embolism [415.19]        Comments         ANTICOAG TODAY: Anticoagulation Summary as of 01/20/2014   INR goal 2.0-3.0  Selected INR 2.70 (01/20/2014)  Next INR check 02/17/2014  Target end date Indefinite   Indications  Pulmonary embolism [415.19]      Anticoagulation Episode Summary   [facility] check [facility]   Preferred lab    Send INR reminders to    Comments       PATIENT INSTRUCTIONS: Patient Instructions  Patient instructed to take medications as defined in the Anti-coagulation Track section of this encounter.  Patient instructed to take today's dose.  Patient verbalized understanding of these instructions.       FOLLOW-UP Return in 4 weeks (on 02/17/2014) for Follow up INR at 0915h.  [name], III Pharm.D., CACP

## 2014-01-20 NOTE — Patient Instructions (Signed)
Patient instructed to take medications as defined in the Anti-coagulation Track section of this encounter.  Patient instructed to take today's dose.  Patient verbalized understanding of these instructions.    

## 2014-02-17 ENCOUNTER — Ambulatory Visit (INDEPENDENT_AMBULATORY_CARE_PROVIDER_SITE_OTHER): Payer: Commercial Managed Care - HMO | Admitting: Pharmacist

## 2014-02-17 DIAGNOSIS — I2699 Other pulmonary embolism without acute cor pulmonale: Secondary | ICD-10-CM

## 2014-02-17 LAB — POCT INR: INR: 3

## 2014-02-17 NOTE — Patient Instructions (Signed)
Patient instructed to take medications as defined in the Anti-coagulation Track section of this encounter.  Patient instructed to take today's dose.  Patient verbalized understanding of these instructions.    

## 2014-02-17 NOTE — Progress Notes (Signed)
Anti-Coagulation Progress Note  Alexander English is a 58 y.o. male who is currently on an anti-coagulation regimen.    RECENT RESULTS: Recent results are below, the most recent result is correlated with a dose of 37.5 mg. per week: Lab Results  Component Value Date   INR 3.00 02/17/2014   INR 2.70 01/20/2014   INR 2.90 12/30/2013    ANTI-COAG DOSE: Anticoagulation Dose Instructions as of 02/17/2014     [name] Tue Wed Thu Fri Sat   New Dose 5 mg 5 mg 5 mg 5 mg 5 mg 5 mg 5 mg       ANTICOAG SUMMARY: Anticoagulation Episode Summary   Current INR goal 2.0-3.0  Next INR check 03/17/2014  INR from last check 3.00 (02/17/2014)  Weekly max dose   Target end date Indefinite  [facility]  Preferred lab   Send INR reminders to    Indications  Pulmonary embolism [415.19]        Comments         ANTICOAG TODAY: Anticoagulation Summary as of 02/17/2014   INR goal 2.0-3.0  Selected INR 3.00 (02/17/2014)  Next INR check 03/17/2014  Target end date Indefinite   Indications  Pulmonary embolism [415.19]      Anticoagulation Episode Summary   [facility] check [facility]   Preferred lab    Send INR reminders to    Comments       PATIENT INSTRUCTIONS: Patient Instructions  Patient instructed to take medications as defined in the Anti-coagulation Track section of this encounter.  Patient instructed to take today's dose.  Patient verbalized understanding of these instructions.       FOLLOW-UP Return in 4 weeks (on 03/17/2014) for Follow up INR at 0900h.  [name], III Pharm.D., CACP

## 2014-03-17 ENCOUNTER — Ambulatory Visit (INDEPENDENT_AMBULATORY_CARE_PROVIDER_SITE_OTHER): Payer: Commercial Managed Care - HMO | Admitting: Pharmacist

## 2014-03-17 DIAGNOSIS — Z7901 Long term (current) use of anticoagulants: Secondary | ICD-10-CM

## 2014-03-17 DIAGNOSIS — I2699 Other pulmonary embolism without acute cor pulmonale: Secondary | ICD-10-CM

## 2014-03-17 LAB — POCT INR: INR: 2.9

## 2014-03-17 NOTE — Patient Instructions (Signed)
Patient instructed to take medications as defined in the Anti-coagulation Track section of this encounter.  Patient instructed to take today's dose.  Patient verbalized understanding of these instructions.    

## 2014-03-17 NOTE — Progress Notes (Signed)
Anti-Coagulation Progress Note  Alexander English is a 58 y.o. male who is currently on an anti-coagulation regimen.    RECENT RESULTS: Recent results are below, the most recent result is correlated with a dose of 35 mg. per week: Lab Results  Component Value Date   INR 2.90 03/17/2014   INR 3.00 02/17/2014   INR 2.70 01/20/2014    ANTI-COAG DOSE: Anticoagulation Dose Instructions as of 03/17/2014     [name] Tue Wed Thu Fri Sat   New Dose 5 mg 2.5 mg 5 mg 5 mg 2.5 mg 5 mg 5 mg       ANTICOAG SUMMARY: Anticoagulation Episode Summary   Current INR goal 2.0-3.0  Next INR check 04/14/2014  INR from last check 2.90 (03/17/2014)  Weekly max dose   Target end date Indefinite  [facility]  Preferred lab   Send INR reminders to    Indications  Pulmonary embolism [415.19]        Comments         ANTICOAG TODAY: Anticoagulation Summary as of 03/17/2014   INR goal 2.0-3.0  Selected INR 2.90 (03/17/2014)  Next INR check 04/14/2014  Target end date Indefinite   Indications  Pulmonary embolism [415.19]      Anticoagulation Episode Summary   [facility] check [facility]   Preferred lab    Send INR reminders to    Comments       PATIENT INSTRUCTIONS: Patient Instructions  Patient instructed to take medications as defined in the Anti-coagulation Track section of this encounter.  Patient instructed to take today's dose.  Patient verbalized understanding of these instructions.       FOLLOW-UP Return in 4 weeks (on 04/14/2014) for Follow up INR at 0845h.  [name], III Pharm.D., CACP

## 2014-03-17 NOTE — Progress Notes (Signed)
INTERNAL MEDICINE TEACHING ATTENDING ADDENDUM - Aldine Contes M.D  Duration- lifelong, Indication- PE, INR- PE. Agree with Dr. Gladstone Pih recommendations as outlined in his note.

## 2014-04-02 ENCOUNTER — Ambulatory Visit (INDEPENDENT_AMBULATORY_CARE_PROVIDER_SITE_OTHER): Payer: Commercial Managed Care - HMO | Admitting: Internal Medicine

## 2014-04-02 ENCOUNTER — Other Ambulatory Visit: Payer: Self-pay | Admitting: Internal Medicine

## 2014-04-02 ENCOUNTER — Encounter: Payer: Self-pay | Admitting: Internal Medicine

## 2014-04-02 VITALS — BP 133/79 | HR 82 | Temp 97.6°F | Wt 232.4 lb

## 2014-04-02 DIAGNOSIS — I2699 Other pulmonary embolism without acute cor pulmonale: Secondary | ICD-10-CM

## 2014-04-02 DIAGNOSIS — M174 Other bilateral secondary osteoarthritis of knee: Secondary | ICD-10-CM

## 2014-04-02 DIAGNOSIS — Z23 Encounter for immunization: Secondary | ICD-10-CM

## 2014-04-02 NOTE — Assessment & Plan Note (Signed)
He received the flu vaccination today. He is otherwise up to date in his healthcare maintenance.

## 2014-04-02 NOTE — Assessment & Plan Note (Signed)
He has noted a significant increase in the left knee pain with any ambulation. He has right knee pain but it is not nearly as severe. He denies any recent falls or trauma to the left knee. There is been no swelling or redness of that area. He denies any fevers, shakes, or chills. The meloxicam remains effective for his back but does not seem to touch his knee pain. We discussed options to treat the pain that included different nonsteroidals, narcotics, a steroid and lidocaine injection, hyglan injection, and surgical referral for possible knee replacement. I believe that his left knee pain is associated with the stroke and weakness resulting in malaligned gait that he now exhibits. We hope to manage this conservatively and decided to try a steroid and lidocaine injection and assess his response. If this is ineffective in improving his pain he is to call the clinic to let me know, at which point I would refer him to orthopedic surgery to consider hyglan injections.  After informed consent was obtained and a time out was performed, his left knee was prepped in the usual sterile fashion. 40 mg of Kenalog and 1 mL of 1% Xylocaine was injected into the left knee using a 27-gauge needle without difficulty. The patient tolerated the procedure well. Immediately after the procedure he received no relief from the injection. As noted above he will call if he does not get improvement over the next couple of days. In the meantime, we will continue the as needed meloxicam and the Voltaren gel.

## 2014-04-02 NOTE — Patient Instructions (Signed)
It was great to see you again.  I am sorry your left knee is painful.  1) We injected your knee today.  If you do not have improvement in your pain by next week call the clinic to let me know.  2) If you still have pain I will send you to a surgeon for a hyglan series of injections.  3) Keep taking the meloxicam.  Return as scheduled, sooner if you need.

## 2014-04-02 NOTE — Progress Notes (Signed)
   Subjective:    Patient ID: Alexander English, male    DOB: Nov 15, 1955, 58 y.o.   MRN: [id number]  HPI  Please see the A&P for the status of the pt's chronic medical problems.  Review of Systems  Constitutional: Positive for activity change.  Musculoskeletal: Positive for arthralgias, back pain and gait problem. Negative for joint swelling and myalgias.      Objective:   Physical Exam  Nursing note and vitals reviewed. Constitutional: He is oriented to person, place, and time. He appears well-developed and well-nourished. No distress.  HENT:  Head: Normocephalic and atraumatic.  Musculoskeletal:       Left knee: He exhibits decreased range of motion and bony tenderness. He exhibits no swelling, no effusion, no ecchymosis, no deformity, no laceration, no erythema and normal patellar mobility. Tenderness found. Medial joint line and lateral joint line tenderness noted.  Neurological: He is alert and oriented to person, place, and time. He exhibits normal muscle tone.  Skin: Skin is warm and dry. No rash noted. He is not diaphoretic. No erythema.  Psychiatric: He has a normal mood and affect. His behavior is normal. Judgment and thought content normal.      Assessment & Plan:   Please see problem oriented charting.

## 2014-04-14 ENCOUNTER — Ambulatory Visit: Payer: Commercial Managed Care - HMO

## 2014-04-21 ENCOUNTER — Ambulatory Visit (INDEPENDENT_AMBULATORY_CARE_PROVIDER_SITE_OTHER): Payer: Commercial Managed Care - HMO | Admitting: Pharmacist

## 2014-04-21 DIAGNOSIS — I2699 Other pulmonary embolism without acute cor pulmonale: Secondary | ICD-10-CM

## 2014-04-21 LAB — POCT INR: INR: 2.6

## 2014-04-21 NOTE — Progress Notes (Signed)
Anti-Coagulation Progress Note  Alexander English is a 58 y.o. male who is currently on an anti-coagulation regimen.    RECENT RESULTS: Recent results are below, the most recent result is correlated with a dose of 30 mg. per week: Lab Results  Component Value Date   INR 2.60 04/21/2014   INR 2.90 03/17/2014   INR 3.00 02/17/2014    ANTI-COAG DOSE: Anticoagulation Dose Instructions as of 04/21/2014     [name] Tue Wed Thu Fri Sat   New Dose 5 mg 2.5 mg 5 mg 5 mg 2.5 mg 5 mg 5 mg       ANTICOAG SUMMARY: Anticoagulation Episode Summary   Current INR goal 2.0-3.0  Next INR check 05/19/2014  INR from last check 2.60 (04/21/2014)  Weekly max dose   Target end date Indefinite  [facility]  Preferred lab   Send INR reminders to    Indications  Pulmonary embolism [I26.99]        Comments         ANTICOAG TODAY: Anticoagulation Summary as of 04/21/2014   INR goal 2.0-3.0  Selected INR 2.60 (04/21/2014)  Next INR check 05/19/2014  Target end date Indefinite   Indications  Pulmonary embolism [I26.99]      Anticoagulation Episode Summary   [facility] check location [facility]   Preferred lab    Send INR reminders to    Comments       PATIENT INSTRUCTIONS: Patient Instructions  Patient instructed to take medications as defined in the Anti-coagulation Track section of this encounter.  Patient instructed to take today's dose.  Patient verbalized understanding of these instructions.        FOLLOW-UP Return in 4 weeks (on 05/19/2014) for Follow-up INR at 1000.   Theron Arista, PharmD Clinical Pharmacist - Resident Pager: [phone] 10/26/201510:30 AM

## 2014-04-21 NOTE — Patient Instructions (Signed)
Patient instructed to take medications as defined in the Anti-coagulation Track section of this encounter.  Patient instructed to take today's dose.  Patient verbalized understanding of these instructions.    

## 2014-04-21 NOTE — Progress Notes (Signed)
Indication: Thromboembolism. Duration: Lifelong per patient preference. INR at target. I agree with Dr. Kristine Royal assessment and plan as documented.

## 2014-05-19 ENCOUNTER — Encounter: Payer: Self-pay | Admitting: *Deleted

## 2014-05-19 ENCOUNTER — Ambulatory Visit (INDEPENDENT_AMBULATORY_CARE_PROVIDER_SITE_OTHER): Payer: Commercial Managed Care - HMO | Admitting: Pharmacist

## 2014-05-19 DIAGNOSIS — I2699 Other pulmonary embolism without acute cor pulmonale: Secondary | ICD-10-CM

## 2014-05-19 DIAGNOSIS — Z7901 Long term (current) use of anticoagulants: Secondary | ICD-10-CM

## 2014-05-19 LAB — POCT INR: INR: 2.5

## 2014-05-19 NOTE — Progress Notes (Signed)
Pt had an appointment with Dr Elie Confer today and wanted to update you on his Left Knee pain.   He received an injection on 10/7.   He states his pain is now manageable with pain meds.  He rates pain 5/10 while before the injection pain was 9/10.  He has noted some popping noise on and off with movement.

## 2014-05-19 NOTE — Patient Instructions (Signed)
Patient instructed to take medications as defined in the Anti-coagulation Track section of this encounter.  Patient instructed to take today's dose.  Patient verbalized understanding of these instructions.    

## 2014-05-19 NOTE — Progress Notes (Signed)
Anti-Coagulation Progress Note  Alexander English is a 58 y.o. male who is currently on an anti-coagulation regimen.    RECENT RESULTS: Recent results are below, the most recent result is correlated with a dose of 30 mg. per week: Lab Results  Component Value Date   INR 2.50 05/19/2014   INR 2.60 04/21/2014   INR 2.90 03/17/2014    ANTI-COAG DOSE: Anticoagulation Dose Instructions as of 05/19/2014      [name] Tue Wed Thu Fri Sat   New Dose 5 mg 2.5 mg 5 mg 5 mg 2.5 mg 5 mg 5 mg       ANTICOAG SUMMARY: Anticoagulation Episode Summary    Current INR goal 2.0-3.0  Next INR check 06/16/2014  INR from last check 2.50 (05/19/2014)  Weekly max dose   Target end date Indefinite  [facility]  Preferred lab   Send INR reminders to    Indications  Pulmonary embolism [I26.99]        Comments         ANTICOAG TODAY: Anticoagulation Summary as of 05/19/2014    INR goal 2.0-3.0  Selected INR 2.50 (05/19/2014)  Next INR check 06/16/2014  Target end date Indefinite   Indications  Pulmonary embolism [I26.99]      Anticoagulation Episode Summary    [facility] check location [facility]   Preferred lab    Send INR reminders to    Comments       PATIENT INSTRUCTIONS: Patient Instructions  Patient instructed to take medications as defined in the Anti-coagulation Track section of this encounter.  Patient instructed to take today's dose.  Patient verbalized understanding of these instructions.       FOLLOW-UP Return in 4 weeks (on 06/16/2014) for Follow up INR at 0945h.  [name], III Pharm.D., CACP

## 2014-05-19 NOTE — Progress Notes (Signed)
Thank you for the update.  I am glad his symptoms are better controlled after the injection.

## 2014-05-21 NOTE — Progress Notes (Signed)
Patient is on anticoagulation for PE.  INR is appropriate.  I have reviewed Dr. Gladstone Pih note.

## 2014-06-16 ENCOUNTER — Ambulatory Visit (INDEPENDENT_AMBULATORY_CARE_PROVIDER_SITE_OTHER): Payer: Commercial Managed Care - HMO | Admitting: Pharmacist

## 2014-06-16 DIAGNOSIS — Z7901 Long term (current) use of anticoagulants: Secondary | ICD-10-CM

## 2014-06-16 DIAGNOSIS — I63411 Cerebral infarction due to embolism of right middle cerebral artery: Secondary | ICD-10-CM

## 2014-06-16 DIAGNOSIS — I2699 Other pulmonary embolism without acute cor pulmonale: Secondary | ICD-10-CM

## 2014-06-16 LAB — POCT INR: INR: 2.1

## 2014-06-16 MED ORDER — WARFARIN SODIUM 5 MG PO TABS: ORAL_TABLET | ORAL | Status: DC

## 2014-06-16 MED ORDER — ATORVASTATIN CALCIUM 10 MG PO TABS: 10.0000 mg | ORAL_TABLET | Freq: Every day | ORAL | Status: DC

## 2014-06-16 NOTE — Progress Notes (Signed)
Anti-Coagulation Progress Note  Alexander English is a 58 y.o. male who reports to the clinic for monitoring of anticoagulation treatment.    RECENT RESULTS: Recent results are below, the most recent result is correlated with a dose of 30 mg. per week: Lab Results  Component Value Date   INR 2.1 06/16/2014   INR 2.50 05/19/2014   INR 2.60 04/21/2014    Weekly dose was unchanged   ANTI-COAG DOSE: INR as of 06/16/2014 and Previous Dosing Information    INR Dt INR Goal [employer] Sun Mon Tue Wed Thu Fri Sat   06/16/2014 2.1 2.0-3.0 30 mg 5 mg 2.5 mg 5 mg 5 mg 2.5 mg 5 mg 5 mg    Anticoagulation Dose Instructions as of 06/16/2014      Total Sun Mon Tue Wed Thu Fri Sat   New Dose 30 mg 5 mg 2.5 mg 5 mg 5 mg 2.5 mg 5 mg 5 mg     (5 mg x 1)  (5 mg x 0.5)  (5 mg x 1)  (5 mg x 1)  (5 mg x 0.5)  (5 mg x 1)  (5 mg x 1)                           ANTICOAG SUMMARY: Anticoagulation Episode Summary    Current INR goal 2.0-3.0  Next INR check 07/14/2014  INR from last check 2.1 (06/16/2014)  Weekly max dose   Target end date Indefinite  [facility] check [facility]  Preferred lab   Send INR reminders to    Indications  Pulmonary embolism [I26.99]        Comments        PATIENT INSTRUCTIONS: Patient Instructions  Patient instructed to take medications as defined in the Anti-coagulation Track section of this encounter.  Patient instructed to continue today's dose.  Patient verbalized understanding of these instructions.      FOLLOW-UP No Follow-up on file.  Flossie Dibble, PharmD BCPS, BCACP

## 2014-06-16 NOTE — Patient Instructions (Signed)
Patient instructed to take medications as defined in the Anti-coagulation Track section of this encounter.  Patient instructed to continue today's dose.  Patient verbalized understanding of these instructions.

## 2014-06-16 NOTE — Progress Notes (Signed)
Patient on anticoagulation for PE.  INR was 2.1.  I have reviewed Dr. Julianne Rice note.

## 2014-07-11 ENCOUNTER — Telehealth: Payer: Self-pay | Admitting: Pharmacist

## 2014-07-11 NOTE — Telephone Encounter (Signed)
Called to alert him that when last appointment was scheduled by Dr. Maudie Mercury she was unaware that [facility] would be closed on Monday 18-JAN-16 in observation of MKL. Message left on machine to NOT come 18-JAN-16 and instead, come 25-JAN-16.

## 2014-07-21 ENCOUNTER — Ambulatory Visit: Payer: Commercial Managed Care - HMO

## 2014-07-25 ENCOUNTER — Telehealth: Payer: Self-pay | Admitting: *Deleted

## 2014-07-25 NOTE — Telephone Encounter (Signed)
RETURNED CALL TO PATIENT. WANTING EYE REFERRAL/ PATIENT WILL NEED OFFICE VISIT FOR REFERRAL/ LAST SEEN 10-7-015. LELA STURDIVANT NTII 5:30PM

## 2014-07-28 ENCOUNTER — Encounter: Payer: Self-pay | Admitting: *Deleted

## 2014-07-28 ENCOUNTER — Ambulatory Visit (INDEPENDENT_AMBULATORY_CARE_PROVIDER_SITE_OTHER): Payer: Commercial Managed Care - HMO | Admitting: Pharmacist

## 2014-07-28 ENCOUNTER — Ambulatory Visit (INDEPENDENT_AMBULATORY_CARE_PROVIDER_SITE_OTHER): Payer: Commercial Managed Care - HMO | Admitting: Internal Medicine

## 2014-07-28 ENCOUNTER — Encounter: Payer: Self-pay | Admitting: Internal Medicine

## 2014-07-28 VITALS — BP 120/76 | HR 74 | Temp 97.7°F | Ht 72.0 in | Wt 235.4 lb

## 2014-07-28 DIAGNOSIS — H9319 Tinnitus, unspecified ear: Secondary | ICD-10-CM | POA: Diagnosis not present

## 2014-07-28 DIAGNOSIS — I2699 Other pulmonary embolism without acute cor pulmonale: Secondary | ICD-10-CM

## 2014-07-28 DIAGNOSIS — I69359 Hemiplegia and hemiparesis following cerebral infarction affecting unspecified side: Secondary | ICD-10-CM

## 2014-07-28 DIAGNOSIS — Z0000 Encounter for general adult medical examination without abnormal findings: Secondary | ICD-10-CM

## 2014-07-28 DIAGNOSIS — M170 Bilateral primary osteoarthritis of knee: Secondary | ICD-10-CM

## 2014-07-28 DIAGNOSIS — H9311 Tinnitus, right ear: Secondary | ICD-10-CM

## 2014-07-28 LAB — POCT INR: INR: 2.2

## 2014-07-28 MED ORDER — WARFARIN SODIUM 5 MG PO TABS: ORAL_TABLET | ORAL | Status: DC

## 2014-07-28 NOTE — Assessment & Plan Note (Signed)
Patient with persistent bilateral knee pain though left worse than right. Patient denies any swelling or redness around the area. Pain thought to be secondary to his stroke and weakness resulting in a malaligned gait. Patient received a steroid injection at his last visit on 04/02/2014 but reports no significant relief. Patient continues to take meloxicam and Voltaren gel. Last knee radiograph was from 2013. -Follow-up knee radiograph today -Consider referral to orthopedic surgery in the future. -Continue meloxicam and Voltaren gel.

## 2014-07-28 NOTE — Assessment & Plan Note (Addendum)
Patient coming to clinic with a request for a referral back to his optometrist. Patient states that he had gone too long and needed a referral back in order to be seen. Patient states the same for [facility] as well. Patient denies any acute issues with either his eyes or his dentition at this point. He only wants to go back for routine screening. Patient additionally inquiring about the utility of a digital rectal exam and screening for prostate cancer. -Referral to optometry and dentistry placed -Patient was counseled on some of the risks and benefits of prostate cancer screening. I recommended that screening digital rectal exam was likely not going to be beneficial for him and he was in agreement. Patient encouraged to further discuss screening options with his PCP.

## 2014-07-28 NOTE — Patient Instructions (Signed)
Patient instructed to take medications as defined in the Anti-coagulation Track section of this encounter.  Patient instructed to take today's dose.  Patient verbalized understanding of these instructions.    

## 2014-07-28 NOTE — Progress Notes (Signed)
Anti-Coagulation Progress Note  Alexander English is a 59 y.o. male who is currently on an anti-coagulation regimen.    RECENT RESULTS: Recent results are below, the most recent result is correlated with a dose of 30 mg. per week: Lab Results  Component Value Date   INR 2.20 07/28/2014   INR 2.1 06/16/2014   INR 2.50 05/19/2014    ANTI-COAG DOSE: Anticoagulation Dose Instructions as of 07/28/2014      [name] Tue Wed Thu Fri Sat   New Dose 5 mg 2.5 mg 5 mg 5 mg 2.5 mg 5 mg 5 mg       ANTICOAG SUMMARY: Anticoagulation Episode Summary    Current INR goal 2.0-3.0  Next INR check 08/25/2014  INR from last check 2.20 (07/28/2014)  Weekly max dose   Target end date Indefinite  [facility] location [facility]  Preferred lab   Send INR reminders to    Indications  Pulmonary embolism [I26.99]        Comments         ANTICOAG TODAY: Anticoagulation Summary as of 07/28/2014    INR goal 2.0-3.0  Selected INR 2.20 (07/28/2014)  Next INR check 08/25/2014  Target end date Indefinite   Indications  Pulmonary embolism [I26.99]      Anticoagulation Episode Summary    [facility] check location [facility]   Preferred lab    Send INR reminders to    Comments       PATIENT INSTRUCTIONS: Patient Instructions  Patient instructed to take medications as defined in the Anti-coagulation Track section of this encounter.  Patient instructed to take today's dose.  Patient verbalized understanding of these instructions.       FOLLOW-UP Return in 4 weeks (on 08/25/2014) for Follow up INR at 0945h.  [name], III Pharm.D., CACP

## 2014-07-28 NOTE — Progress Notes (Signed)
   Subjective:    Patient ID: Alexander English, male    DOB: 07-22-1955, 59 y.o.   MRN: [id number]  HPI  Patient is a 59 year old with a history of hyperlipidemia, osteoarthritis, chronic low back pain, chronic constipation, embolic stroke, obesity, and pulmonary embolism who presents to clinic for a referral optometry and dentistry.   Please refer to separate problem-list charting for more details.  Review of Systems  Constitutional: Negative for fever and chills.  HENT: Positive for tinnitus. Negative for sore throat.   Respiratory: Negative for shortness of breath.   Cardiovascular: Negative for chest pain and palpitations.  Gastrointestinal: Negative for nausea, vomiting, abdominal pain, diarrhea, constipation and blood in stool.  Genitourinary: Negative for dysuria and hematuria.  Musculoskeletal:       Left knee pain  Skin: Negative for rash.  Neurological: Negative for syncope.  Psychiatric/Behavioral: Negative for suicidal ideas.       Objective:   Physical Exam  Constitutional: He is oriented to person, place, and time. He appears well-developed and well-nourished. No distress.  HENT:  Head: Normocephalic and atraumatic.  Eyes: EOM are normal. Pupils are equal, round, and reactive to light. No scleral icterus.  Neck: Normal range of motion. Neck supple. No thyromegaly present.  Cardiovascular: Normal rate and regular rhythm.  Exam reveals no gallop and no friction rub.   No murmur heard. Pulmonary/Chest: Effort normal and breath sounds normal. No respiratory distress. He has no wheezes. He has no rales.  Abdominal: Soft. Bowel sounds are normal. He exhibits no distension. There is no tenderness. There is no rebound.  Musculoskeletal: He exhibits no edema or tenderness.  Stable hyperextension of left knee with ambulation  Neurological: He is alert and oriented to person, place, and time. No cranial nerve deficit.  Walks with a cane  Skin: No rash noted.         Assessment & Plan:  Please refer to separate problem-list charting for more details.

## 2014-07-28 NOTE — Assessment & Plan Note (Signed)
Patient reporting a 4 week history of tinnitus that he describes as being low pitched and chronic. Patient denies any pulsatile nature to it, any clicking, or any associated hearing loss. These features make it less likely to be secondary to sensorineural hearing loss, Mnire's disease, or vascular etiology. Likely secondary to presbycusis. -Referral to audiology for more sensitive evaluation of hearing.

## 2014-07-28 NOTE — Patient Instructions (Signed)
We have referred you to optometry and dentistry for follow up routine appointments. We have also ordered a x-ray of your left knee to further evaluate the progression of your left knee pain. We have also ordered a audiometry test to further evaluate the ringing in your ears. Please follow up with Dr. Eppie Gibson to further discuss the benefits of screening for prostate cancer and further management of your knee pain.   General Instructions:   Please bring your medicines with you each time you come to clinic.  Medicines may include prescription medications, over-the-counter medications, herbal remedies, eye drops, vitamins, or other pills.   Progress Toward Treatment Goals:  No flowsheet data found.  Self Care Goals & Plans:  No flowsheet data found.  No flowsheet data found.   Care Management & Community Referrals:  Referral 04/02/2014  Referrals made to community resources none

## 2014-07-29 ENCOUNTER — Ambulatory Visit (HOSPITAL_COMMUNITY)
Admission: RE | Admit: 2014-07-29 | Discharge: 2014-07-29 | Disposition: A | Discharge status: Home / Self Care | Payer: Commercial Managed Care - HMO | Source: Ambulatory Visit | Attending: Internal Medicine | Admitting: Internal Medicine

## 2014-07-29 ENCOUNTER — Other Ambulatory Visit: Payer: Self-pay | Admitting: Internal Medicine

## 2014-07-29 DIAGNOSIS — R52 Pain, unspecified: Secondary | ICD-10-CM

## 2014-07-29 DIAGNOSIS — M25562 Pain in left knee: Secondary | ICD-10-CM | POA: Diagnosis not present

## 2014-07-29 DIAGNOSIS — M170 Bilateral primary osteoarthritis of knee: Secondary | ICD-10-CM

## 2014-07-29 DIAGNOSIS — M1712 Unilateral primary osteoarthritis, left knee: Secondary | ICD-10-CM | POA: Diagnosis not present

## 2014-07-29 NOTE — Progress Notes (Signed)
Internal Medicine Clinic Attending Date of Visit: 07/28/2014  Case discussed with Dr. Raelene Bott at the time of the visit.  We reviewed the resident's history and exam and pertinent patient test results.  I agree with the assessment, diagnosis, and plan of care documented in the resident's note.

## 2014-08-12 ENCOUNTER — Encounter: Payer: Self-pay | Admitting: Internal Medicine

## 2014-08-12 ENCOUNTER — Ambulatory Visit (INDEPENDENT_AMBULATORY_CARE_PROVIDER_SITE_OTHER): Payer: Commercial Managed Care - HMO | Admitting: Internal Medicine

## 2014-08-12 VITALS — BP 135/69 | HR 78 | Temp 98.1°F | Ht 72.0 in | Wt 228.5 lb

## 2014-08-12 DIAGNOSIS — J069 Acute upper respiratory infection, unspecified: Secondary | ICD-10-CM | POA: Diagnosis not present

## 2014-08-12 MED ORDER — GUAIFENESIN ER 600 MG PO TB12: 600.0000 mg | ORAL_TABLET | Freq: Two times a day (BID) | ORAL | Status: DC

## 2014-08-12 MED ORDER — SALINE SPRAY 0.65 % NA SOLN: 1.0000 | NASAL | Status: DC | PRN

## 2014-08-12 MED ORDER — ALBUTEROL SULFATE (2.5 MG/3ML) 0.083% IN NEBU
2.5000 mg | INHALATION_SOLUTION | Freq: Once | RESPIRATORY_TRACT | Status: AC
  Administered 2014-08-12: 2.5 mg via RESPIRATORY_TRACT

## 2014-08-12 NOTE — Progress Notes (Signed)
Subjective:   [name]   DOB: Oct 21, 1955 59 y.o.   MRN: [id number]  HPI: [name] is a 59 y.o. man with a past medical history as listed below presents for acute URI symptoms.  Pt complains of congestion, sneezing, sore throat, post nasal drip, myalgias and itching in eyes for 7 days. He denies a history of anorexia, chest pain, chills, fevers, nausea, shortness of breath, vomiting and weight loss and denies a history of asthma. Patient does not smoke cigarettes. He has not tried any OTC meds for fear of interacting with his coumadin. 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    Past Medical History  Diagnosis Date  . Embolic stroke involving right middle cerebral artery 08/08/2004    Occured after traveling from [place] to [place] where he was hospitalized for 1 month with no further work-up or therapy.  Returned to [place] unable to walk and was admitted to [facility] immediately after landing. Presumed embolic per neuro but TEE, bubble study, and hypercoag W/U negative. On indefinite coumadin per patient's informed preference.  Residual effects : Left spastic hemiplegia. Tx with phenol tibi  . Pulmonary embolism 09/30/2004    Diagnosed in April 2006 after returning from [place].  Likely provoked as it occurred after a 12 hour plane flight within 2 months of R MCA CVA with left hemiparesis. Patient has made an informed decision to remain on lifelong warfarin.   . Chronic low back pain 06/12/2013    L4-5 facet arthropathy   . Osteoarthritis of both knees 01/26/2012  . Chronic pain of both shoulders 12/07/2013    A/C joint osteoarthritis   . Obesity (BMI 30.0-34.9) 12/07/2013  . Chronic constipation 06/12/2013  . Positive PPD 12/07/2013  . Cluster headaches     Resolved in 2006  . Hyperplastic colon polyp 07/27/2012    Excised endoscopically 07/27/2012   Current Outpatient Prescriptions  Medication Sig Dispense Refill  .  aspirin EC 81 MG tablet Take 81 mg by mouth daily.    Marland Kitchen atorvastatin (LIPITOR) 10 MG tablet Take 1 tablet (10 mg total) by mouth daily. Void other Rx for atorvastatin. Patient requesting 90 days supply. 90 tablet 3  . GARLIC PO Take 1 tablet by mouth daily.    Marland Kitchen guaiFENesin (MUCINEX) 600 MG 12 hr tablet Take 1 tablet (600 mg total) by mouth 2 (two) times daily. 60 tablet 2  . meloxicam (MOBIC) 7.5 MG tablet Take 1 tablet (7.5 mg total) by mouth daily as needed for pain. 30 tablet 11  . Multiple Vitamin (MULTIVITAMIN) tablet Take 1 tablet by mouth daily.    . sodium chloride (OCEAN) 0.65 % SOLN nasal spray Place 1 spray into both nostrils as needed for congestion. 30 mL 2  . warfarin (COUMADIN) 5 MG tablet TAKE ONE TABLET BY MOUTH ONCE DAILY EXCEPT TAKE 0.5 TABLETS BY MOUTH ON MONDAYS AND THURSDAYS 78 tablet 3   Current Facility-Administered Medications  Medication Dose Route Frequency Provider Last Rate Last Dose  . albuterol (PROVENTIL) (2.5 MG/3ML) 0.083% nebulizer solution 2.5 mg  2.5 mg Nebulization Once Clinton Gallant, MD       Family History  Problem Relation Age of Onset  . Stroke Maternal Grandmother   . Unexplained death Mother   . Depression Mother     After husband's death  . Unexplained death Father   . Osteoarthritis Father     Knees  . Early  death Sister   . Seizures Sister   . Early death Brother 23    Unknown cause  . Healthy Daughter   . Healthy Son   . Stroke Maternal Aunt   . Healthy Sister   . Healthy Sister   . Unexplained death Brother   . Healthy Brother   . Healthy Son    History   Social History  . Marital Status: Divorced    Spouse Name: N/A  . Number of Children: N/A  . Years of Education: N/A   Social History Main Topics  . Smoking status: Former Smoker    Quit date: 06/28/2007  . Smokeless tobacco: Never Used  . Alcohol Use: No     Comment: Remote past  . Drug Use: No  . Sexual Activity: Yes    Birth Control/ Protection: None   Other  Topics Concern  . None   Social History Narrative   Born in [place] but has been in the [place] for > 30 years.  Divorced, 1 daughter and 2 sons.  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   Review of Systems: Pertinent items are noted in HPI. Objective:  Physical Exam: Filed Vitals:   08/12/14 0847  BP: 135/69  Pulse: 78  Temp: 98.1 F (36.7 C)  TempSrc: Oral  Height: 6' (1.829 m)  Weight: 228 lb 8 oz (103.647 kg)  SpO2: 100%   General: sitting in chair, NAD  HEENT: PERRL, EOMI, no scleral icterus, TM with some clear fluid behind no erythema or exudate, clear PND, no cervical LAD, no tonsillar erythema or exudates Cardiac: RRR, no rubs, murmurs or gallops Pulm: Slight expiratory wheezes throughout post nebulizer was clear to auscultation bilaterally), moving normal volumes of air, no crackles, rales or rhonchi Abd: soft, nontender, nondistended, BS present Ext: warm and well perfused, no pedal edema Neuro: alert and oriented X3, cranial nerves II-XII grossly intact  Assessment & Plan:  Please see problem oriented charting  Pt discussed with Dr. Marinda Elk

## 2014-08-12 NOTE — Progress Notes (Signed)
Internal Medicine Clinic Attending  Case discussed with Dr. Sadek soon after the resident saw the patient.  We reviewed the resident's history and exam and pertinent patient test results.  I agree with the assessment, diagnosis, and plan of care documented in the resident's note. 

## 2014-08-12 NOTE — Assessment & Plan Note (Signed)
Patient symptoms likely secondary to acute viral upper respiratory infection. Patient afebrile today and no hypoxia. Physical exam with no concerning signs for pneumonia therefore will defer imaging at this time. Patient has not tried any supportive care measures and things seem to be gradually improving.  -Symptomatic therapy suggested: push fluids, rest, use acetaminophen, ibuprofen, antihistamine-decongestant of choice prn and return office visit prn if symptoms persist or worsen. Lack of antibiotic effectiveness discussed with him. Call or return to clinic prn if these symptoms worsen or fail to improve as anticipated.

## 2014-08-12 NOTE — Patient Instructions (Addendum)
General Instructions:   Please try to bring all your medicines next time. This will help [place] keep you safe from mistakes.  For your ongoing cold try the following things: 1. Nasal saline spray 2. Zyrtec or Claritin to help dry up mucus 3. Mucinex for mucus 4. Tylenol or ibuprofen for fever or headaches or sinus pain  If things do not improve come back and see [place] in 1 wk for further evaluation.   Progress Toward Treatment Goals:  No flowsheet data found.  Self Care Goals & Plans:  No flowsheet data found.  No flowsheet data found.   Care Management & Community Referrals:  Referral 04/02/2014  Referrals made to community resources none       Upper Respiratory Infection, Adult An upper respiratory infection (URI) is also sometimes known as the common cold. The upper respiratory tract includes the nose, sinuses, throat, trachea, and bronchi. Bronchi are the airways leading to the lungs. Most people improve within 1 week, but symptoms can last up to 2 weeks. A residual cough may last even longer.  CAUSES Many different viruses can infect the tissues lining the upper respiratory tract. The tissues become irritated and inflamed and often become very moist. Mucus production is also common. A cold is contagious. You can easily spread the virus to others by oral contact. This includes kissing, sharing a glass, coughing, or sneezing. Touching your mouth or nose and then touching a surface, which is then touched by another person, can also spread the virus. SYMPTOMS  Symptoms typically develop 1 to 3 days after you come in contact with a cold virus. Symptoms vary from person to person. They may include:  Runny nose.  Sneezing.  Nasal congestion.  Sinus irritation.  Sore throat.  Loss of voice (laryngitis).  Cough.  Fatigue.  Muscle aches.  Loss of appetite.  Headache.  Low-grade fever. DIAGNOSIS  You might diagnose your own cold based on familiar symptoms, since most  people get a cold 2 to 3 times a year. Your caregiver can confirm this based on your exam. Most importantly, your caregiver can check that your symptoms are not due to another disease such as strep throat, sinusitis, pneumonia, asthma, or epiglottitis. Blood tests, throat tests, and X-rays are not necessary to diagnose a common cold, but they may sometimes be helpful in excluding other more serious diseases. Your caregiver will decide if any further tests are required. RISKS AND COMPLICATIONS  You may be at risk for a more severe case of the common cold if you smoke cigarettes, have chronic heart disease (such as heart failure) or lung disease (such as asthma), or if you have a weakened immune system. The very young and very old are also at risk for more serious infections. Bacterial sinusitis, middle ear infections, and bacterial pneumonia can complicate the common cold. The common cold can worsen asthma and chronic obstructive pulmonary disease (COPD). Sometimes, these complications can require emergency medical care and may be life-threatening. PREVENTION  The best way to protect against getting a cold is to practice good hygiene. Avoid oral or hand contact with people with cold symptoms. Wash your hands often if contact occurs. There is no clear evidence that vitamin C, vitamin E, echinacea, or exercise reduces the chance of developing a cold. However, it is always recommended to get plenty of rest and practice good nutrition. TREATMENT  Treatment is directed at relieving symptoms. There is no cure. Antibiotics are not effective, because the infection is caused by  a virus, not by bacteria. Treatment may include:  Increased fluid intake. Sports drinks offer valuable electrolytes, sugars, and fluids.  Breathing heated mist or steam (vaporizer or shower).  Eating chicken soup or other clear broths, and maintaining good nutrition.  Getting plenty of rest.  Using gargles or lozenges for  comfort.  Controlling fevers with ibuprofen or acetaminophen as directed by your caregiver.  Increasing usage of your inhaler if you have asthma. Zinc gel and zinc lozenges, taken in the first 24 hours of the common cold, can shorten the duration and lessen the severity of symptoms. Pain medicines may help with fever, muscle aches, and throat pain. A variety of non-prescription medicines are available to treat congestion and runny nose. Your caregiver can make recommendations and may suggest nasal or lung inhalers for other symptoms.  HOME CARE INSTRUCTIONS   Only take over-the-counter or prescription medicines for pain, discomfort, or fever as directed by your caregiver.  Use a warm mist humidifier or inhale steam from a shower to increase air moisture. This may keep secretions moist and make it easier to breathe.  Drink enough water and fluids to keep your urine clear or pale yellow.  Rest as needed.  Return to work when your temperature has returned to normal or as your caregiver advises. You may need to stay home longer to avoid infecting others. You can also use a face mask and careful hand washing to prevent spread of the virus. SEEK MEDICAL CARE IF:   After the first few days, you feel you are getting worse rather than better.  You need your caregiver's advice about medicines to control symptoms.  You develop chills, worsening shortness of breath, or brown or red sputum. These may be signs of pneumonia.  You develop yellow or brown nasal discharge or pain in the face, especially when you bend forward. These may be signs of sinusitis.  You develop a fever, swollen neck glands, pain with swallowing, or white areas in the back of your throat. These may be signs of strep throat. SEEK IMMEDIATE MEDICAL CARE IF:   You have a fever.  You develop severe or persistent headache, ear pain, sinus pain, or chest pain.  You develop wheezing, a prolonged cough, cough up blood, or have a  change in your usual mucus (if you have chronic lung disease).  You develop sore muscles or a stiff neck. Document Released: 12/07/2000 Document Revised: 09/05/2011 Document Reviewed: 09/18/2013 [facility] Patient Information 2015 [place], [place]. This information is not intended to replace advice given to you by your health care provider. Make sure you discuss any questions you have with your health care provider.

## 2014-08-21 ENCOUNTER — Telehealth: Payer: Self-pay | Admitting: Pharmacist

## 2014-08-21 NOTE — Telephone Encounter (Signed)
Call to patient to confirm appointment for 08/25/14 at 10:00 lmtcb

## 2014-08-25 ENCOUNTER — Ambulatory Visit (INDEPENDENT_AMBULATORY_CARE_PROVIDER_SITE_OTHER): Payer: Commercial Managed Care - HMO | Admitting: Pharmacist

## 2014-08-25 DIAGNOSIS — I2699 Other pulmonary embolism without acute cor pulmonale: Secondary | ICD-10-CM

## 2014-08-25 DIAGNOSIS — H2513 Age-related nuclear cataract, bilateral: Secondary | ICD-10-CM | POA: Diagnosis not present

## 2014-08-25 DIAGNOSIS — H40013 Open angle with borderline findings, low risk, bilateral: Secondary | ICD-10-CM | POA: Diagnosis not present

## 2014-08-25 LAB — POCT INR: INR: 2.8

## 2014-08-25 NOTE — Patient Instructions (Signed)
Patient instructed to take medications as defined in the Anti-coagulation Track section of this encounter.  Patient instructed to HOLD today's dose--and ALL SUBSEQUENT doses leading up to deep-dental cleaning scheduled for this Friday 4-MAR-16.   Patient states he has been authorized by his PCP to have his warfarin omitted in advance of the dental cleaning procedure and that the faxed documentation is on-file at his Dentist's office. Patient verbalized understanding of these instructions.

## 2014-08-25 NOTE — Progress Notes (Signed)
Anti-Coagulation Progress Note  Alexander English is a 59 y.o. male who is currently on an anti-coagulation regimen.    RECENT RESULTS: Recent results are below, the most recent result is correlated with a dose of 27.5 mg. per week: He will commence a 5 day hold of warfarin in advance of his planned dental deep cleaning procedure to be performed on Friday 4-MAR-16. Patient states that there exists on-file with his Dentist an authorization form provided by his PCP. Patient is advised the following day--to resume warfarin based upon the last instructions provided, i.e. Using his 5mg  peach colored warfarin tablets he will commence back upon warfarin on Saturday 5-MAR-16 by taking:  1x5mg  warfarin on Saturdays; Sundays; Tuesdays; and Thursdays. On MWF, he takes 1/2x 5mg  (2.5mg ) warfarin on these days. RTC on 21-MAR-16--at steady state upon re-initiated regimen of 27.5mg /wk.   Lab Results  Component Value Date   INR 2.80 08/25/2014   INR 2.20 07/28/2014   INR 2.1 06/16/2014    ANTI-COAG DOSE: Anticoagulation Dose Instructions as of 08/25/2014      [name] Tue Wed Thu Fri Sat   New Dose 5 mg Hold Hold Hold Hold Hold 5 mg       ANTICOAG SUMMARY: Anticoagulation Episode Summary    Current INR goal 2.0-3.0  Next INR check 09/15/2014  INR from last check 2.80 (08/25/2014)  Weekly max dose   Target end date Indefinite  [facility]  Preferred lab   Send INR reminders to    Indications  Pulmonary embolism [I26.99]        Comments         ANTICOAG TODAY: Anticoagulation Summary as of 08/25/2014    INR goal 2.0-3.0  Selected INR 2.80 (08/25/2014)  Next INR check 09/15/2014  Target end date Indefinite   Indications  Pulmonary embolism [I26.99]      Anticoagulation Episode Summary    [facility]   Preferred lab    Send INR reminders to    Comments       PATIENT INSTRUCTIONS: Patient Instructions  Patient instructed to take  medications as defined in the Anti-coagulation Track section of this encounter.  Patient instructed to HOLD today's dose--and ALL SUBSEQUENT doses leading up to deep-dental cleaning scheduled for this Friday 4-MAR-16.   Patient states he has been authorized by his PCP to have his warfarin omitted in advance of the dental cleaning procedure and that the faxed documentation is on-file at his Dentist's office. Patient verbalized understanding of these instructions.       FOLLOW-UP Return in 3 weeks (on 09/15/2014) for Follow up INR at 1000h.  [name], III Pharm.D., CACP

## 2014-08-26 NOTE — Progress Notes (Signed)
INTERNAL MEDICINE TEACHING ATTENDING ADDENDUM - Alexander English M.D  Duration- indefinite, Indication- PE, INR- therapeutic. Agree with Dr. Gladstone Pih recommendations as outlined in his note. Patient to begin 5 day hold of coumadin for deep dental cleaning

## 2014-09-01 ENCOUNTER — Telehealth: Payer: Self-pay | Admitting: *Deleted

## 2014-09-01 NOTE — Addendum Note (Signed)
Addended by: Hulan Fray on: 09/01/2014 09:45 AM   Modules accepted: Orders

## 2014-09-01 NOTE — Telephone Encounter (Signed)
Pt calls and states he is scheduled to have a deep dental cleaning 3/11 and wants dr groce to call him and advise when to stop coumadin

## 2014-09-08 ENCOUNTER — Ambulatory Visit: Payer: Medicare HMO | Admitting: Audiology

## 2014-09-11 ENCOUNTER — Telehealth: Payer: Self-pay | Admitting: Pharmacist

## 2014-09-11 NOTE — Telephone Encounter (Signed)
Call to patient to confirm appointment for 09/15/14 at 10:00 lmtcb

## 2014-09-15 ENCOUNTER — Ambulatory Visit (INDEPENDENT_AMBULATORY_CARE_PROVIDER_SITE_OTHER): Payer: Commercial Managed Care - HMO | Admitting: Pharmacist

## 2014-09-15 DIAGNOSIS — I2699 Other pulmonary embolism without acute cor pulmonale: Secondary | ICD-10-CM | POA: Diagnosis not present

## 2014-09-15 DIAGNOSIS — Z7901 Long term (current) use of anticoagulants: Secondary | ICD-10-CM

## 2014-09-15 LAB — POCT INR: INR: 2.7

## 2014-09-15 NOTE — Patient Instructions (Signed)
Patient instructed to take medications as defined in the Anti-coagulation Track section of this encounter.  Patient instructed to take today's dose.  Patient verbalized understanding of these instructions.    

## 2014-09-15 NOTE — Progress Notes (Signed)
Anti-Coagulation Progress Note  Alexander English is a 59 y.o. male who is currently on an anti-coagulation regimen.    RECENT RESULTS: Recent results are below, the most recent result is correlated with a dose of 35 mg. per week: Lab Results  Component Value Date   INR 2.70 09/15/2014   INR 2.80 08/25/2014   INR 2.20 07/28/2014    ANTI-COAG DOSE: Anticoagulation Dose Instructions as of 09/15/2014      [name] Tue Wed Thu Fri Sat   New Dose 5 mg 5 mg 5 mg 2.5 mg 5 mg 5 mg 5 mg       ANTICOAG SUMMARY: Anticoagulation Episode Summary    Current INR goal 2.0-3.0  Next INR check 10/13/2014  INR from last check 2.70 (09/15/2014)  Weekly max dose   Target end date Indefinite  [facility]  Preferred lab   Send INR reminders to    Indications  Pulmonary embolism [I26.99]        Comments         ANTICOAG TODAY: Anticoagulation Summary as of 09/15/2014    INR goal 2.0-3.0  Selected INR 2.70 (09/15/2014)  Next INR check 10/13/2014  Target end date Indefinite   Indications  Pulmonary embolism [I26.99]      Anticoagulation Episode Summary    [facility]   Preferred lab    Send INR reminders to    Comments       PATIENT INSTRUCTIONS: Patient Instructions  Patient instructed to take medications as defined in the Anti-coagulation Track section of this encounter.  Patient instructed to take today's dose.  Patient verbalized understanding of these instructions.       FOLLOW-UP Return in 4 weeks (on 10/13/2014) for Follow up INR at 0945h.  [name], III Pharm.D., CACP

## 2014-10-13 ENCOUNTER — Ambulatory Visit (INDEPENDENT_AMBULATORY_CARE_PROVIDER_SITE_OTHER): Payer: Commercial Managed Care - HMO | Admitting: Pharmacist

## 2014-10-13 DIAGNOSIS — I2699 Other pulmonary embolism without acute cor pulmonale: Secondary | ICD-10-CM | POA: Diagnosis not present

## 2014-10-13 DIAGNOSIS — Z7901 Long term (current) use of anticoagulants: Secondary | ICD-10-CM

## 2014-10-13 LAB — POCT INR: INR: 2.8

## 2014-10-13 NOTE — Progress Notes (Signed)
INTERNAL MEDICINE TEACHING ATTENDING ADDENDUM - Aldine Contes M.D  Duration- indefinite, Indication- PE, INR- therapeutic. Agree with Dr. Gladstone Pih recommendations as outlined in his note.

## 2014-10-13 NOTE — Patient Instructions (Signed)
Patient instructed to take medications as defined in the Anti-coagulation Track section of this encounter.  Patient instructed to take today's dose.  Patient verbalized understanding of these instructions.    

## 2014-10-13 NOTE — Progress Notes (Signed)
Anti-Coagulation Progress Note  Alexander English is a 59 y.o. male who is currently on an anti-coagulation regimen.    RECENT RESULTS: Recent results are below, the most recent result is correlated with a dose of 32.5 mg. per week: Lab Results  Component Value Date   INR 2.80 10/13/2014   INR 2.70 09/15/2014   INR 2.80 08/25/2014    ANTI-COAG DOSE: Anticoagulation Dose Instructions as of 10/13/2014      [name] Tue Wed Thu Fri Sat   New Dose 5 mg 5 mg 5 mg 2.5 mg 5 mg 5 mg 5 mg       ANTICOAG SUMMARY: Anticoagulation Episode Summary    Current INR goal 2.0-3.0  Next INR check 11/10/2014  INR from last check 2.80 (10/13/2014)  Weekly max dose   Target end date Indefinite  [facility]  Preferred lab   Send INR reminders to    Indications  Pulmonary embolism [I26.99]        Comments         ANTICOAG TODAY: Anticoagulation Summary as of 10/13/2014    INR goal 2.0-3.0  Selected INR 2.80 (10/13/2014)  Next INR check 11/10/2014  Target end date Indefinite   Indications  Pulmonary embolism [I26.99]      Anticoagulation Episode Summary    [facility]   Preferred lab    Send INR reminders to    Comments       PATIENT INSTRUCTIONS: Patient Instructions  Patient instructed to take medications as defined in the Anti-coagulation Track section of this encounter.  Patient instructed to take today's dose.  Patient verbalized understanding of these instructions.       FOLLOW-UP Return in 4 weeks (on 11/10/2014) for Follow up INR at 0900h.  [name], III Pharm.D., CACP

## 2014-11-05 NOTE — Addendum Note (Signed)
Addended by: Hulan Fray on: 11/05/2014 05:59 PM   Modules accepted: Orders

## 2014-11-10 ENCOUNTER — Ambulatory Visit (INDEPENDENT_AMBULATORY_CARE_PROVIDER_SITE_OTHER): Payer: Commercial Managed Care - HMO | Admitting: Pharmacist

## 2014-11-10 DIAGNOSIS — I2699 Other pulmonary embolism without acute cor pulmonale: Secondary | ICD-10-CM | POA: Diagnosis not present

## 2014-11-10 DIAGNOSIS — Z7901 Long term (current) use of anticoagulants: Secondary | ICD-10-CM | POA: Diagnosis not present

## 2014-11-10 LAB — POCT INR: INR: 3

## 2014-11-10 MED ORDER — WARFARIN SODIUM 5 MG PO TABS: ORAL_TABLET | ORAL | Status: DC

## 2014-11-10 NOTE — Patient Instructions (Signed)
Patient instructed to take medications as defined in the Anti-coagulation Track section of this encounter.  Patient instructed to take today's dose.  Patient verbalized understanding of these instructions.    

## 2014-11-10 NOTE — Progress Notes (Signed)
Anti-Coagulation Progress Note  Alexander English is a 59 y.o. male who is currently on an anti-coagulation regimen.    RECENT RESULTS: Recent results are below, the most recent result is correlated with a dose of 32.5 mg. per week: Lab Results  Component Value Date   INR 3.00 11/10/2014   INR 2.80 10/13/2014   INR 2.70 09/15/2014    ANTI-COAG DOSE: Anticoagulation Dose Instructions as of 11/10/2014      [name] Tue Wed Thu Fri Sat   New Dose 5 mg 2.5 mg 5 mg 5 mg 2.5 mg 5 mg 5 mg       ANTICOAG SUMMARY: Anticoagulation Episode Summary    Current INR goal 2.0-3.0  Next INR check 12/08/2014  INR from last check 3.00 (11/10/2014)  Weekly max dose   Target end date Indefinite  [facility]  Preferred lab   Send INR reminders to    Indications  Pulmonary embolism [I26.99]        Comments         ANTICOAG TODAY: Anticoagulation Summary as of 11/10/2014    INR goal 2.0-3.0  Selected INR 3.00 (11/10/2014)  Next INR check 12/08/2014  Target end date Indefinite   Indications  Pulmonary embolism [I26.99]      Anticoagulation Episode Summary    [facility] check location [facility]   Preferred lab    Send INR reminders to    Comments       PATIENT INSTRUCTIONS: Patient Instructions  Patient instructed to take medications as defined in the Anti-coagulation Track section of this encounter.  Patient instructed to take today's dose.  Patient verbalized understanding of these instructions.       FOLLOW-UP Return in 4 weeks (on 12/08/2014) for Follow up INR at 0845h.  [name], III Pharm.D., CACP

## 2014-11-12 NOTE — Progress Notes (Signed)
I have reviewed Dr. Gladstone Pih note.  He is on anticoagulation for PE.  He has chosen lifelong anticoagulation.

## 2014-12-07 ENCOUNTER — Other Ambulatory Visit: Payer: Self-pay | Admitting: Internal Medicine

## 2014-12-08 ENCOUNTER — Ambulatory Visit: Payer: Medicare HMO

## 2014-12-08 ENCOUNTER — Ambulatory Visit (INDEPENDENT_AMBULATORY_CARE_PROVIDER_SITE_OTHER): Payer: Commercial Managed Care - HMO | Admitting: Internal Medicine

## 2014-12-08 ENCOUNTER — Ambulatory Visit (INDEPENDENT_AMBULATORY_CARE_PROVIDER_SITE_OTHER): Payer: Commercial Managed Care - HMO | Admitting: Pharmacist

## 2014-12-08 ENCOUNTER — Encounter: Payer: Self-pay | Admitting: Internal Medicine

## 2014-12-08 DIAGNOSIS — M179 Osteoarthritis of knee, unspecified: Secondary | ICD-10-CM | POA: Diagnosis not present

## 2014-12-08 DIAGNOSIS — I2699 Other pulmonary embolism without acute cor pulmonale: Secondary | ICD-10-CM

## 2014-12-08 DIAGNOSIS — Z791 Long term (current) use of non-steroidal anti-inflammatories (NSAID): Secondary | ICD-10-CM | POA: Diagnosis not present

## 2014-12-08 DIAGNOSIS — Z7901 Long term (current) use of anticoagulants: Secondary | ICD-10-CM | POA: Diagnosis not present

## 2014-12-08 DIAGNOSIS — M170 Bilateral primary osteoarthritis of knee: Secondary | ICD-10-CM

## 2014-12-08 LAB — BASIC METABOLIC PANEL WITH GFR
BUN: 12 mg/dL (ref 6–23)
CO2: 25 mEq/L (ref 19–32)
Calcium: 9.8 mg/dL (ref 8.4–10.5)
Chloride: 107 mEq/L (ref 96–112)
Creat: 0.86 mg/dL (ref 0.50–1.35)
GFR, Est African American: >89 mL/min
GFR, Est Non African American: >89 mL/min
Glucose, Bld: 90 mg/dL (ref 70–99)
Potassium: 4.1 mEq/L (ref 3.5–5.3)
Sodium: 142 mEq/L (ref 135–145)

## 2014-12-08 LAB — CBC
HCT: 44 % (ref 39.0–52.0)
Hemoglobin: 14.5 g/dL (ref 13.0–17.0)
MCH: 26.4 pg (ref 26.0–34.0)
MCHC: 33 g/dL (ref 30.0–36.0)
MCV: 80.1 fL (ref 78.0–100.0)
MPV: 10.1 fL (ref 8.6–12.4)
Platelets: 205 10*3/uL (ref 150–400)
RBC: 5.49 MIL/uL (ref 4.22–5.81)
RDW: 14.5 % (ref 11.5–15.5)
WBC: 3.8 10*3/uL — ABNORMAL LOW (ref 4.0–10.5)

## 2014-12-08 LAB — POCT INR: INR: 3.7

## 2014-12-08 MED ORDER — MELOXICAM 7.5 MG PO TABS: 7.5000 mg | ORAL_TABLET | Freq: Every day | ORAL | Status: DC | PRN

## 2014-12-08 NOTE — Patient Instructions (Signed)
General Instructions: Please use Meloxicam as needed for pain  You should see an orthopedic surgeon  I will send you to physical therapy as well  Please come back to see Dr Eppie Gibson in 2-3 months   Please bring your medicines with you each time you come to clinic.  Medicines may include prescription medications, over-the-counter medications, herbal remedies, eye drops, vitamins, or other pills.   Progress Toward Treatment Goals:  No flowsheet data found.  Self Care Goals & Plans:  No flowsheet data found.  No flowsheet data found.   Care Management & Community Referrals:  Referral 04/02/2014  Referrals made to community resources none

## 2014-12-08 NOTE — Progress Notes (Signed)
Anti-Coagulation Progress Note  Alexander English is a 59 y.o. male who is currently on an anti-coagulation regimen.    RECENT RESULTS: Recent results are below, the most recent result is correlated with a dose of 30 mg. per week: Lab Results  Component Value Date   INR 3.70 12/08/2014   INR 3.00 11/10/2014   INR 2.80 10/13/2014    ANTI-COAG DOSE: Anticoagulation Dose Instructions as of 12/08/2014      [name] Tue Wed Thu Fri Sat   New Dose 5 mg 2.5 mg 5 mg 2.5 mg 5 mg 2.5 mg 5 mg       ANTICOAG SUMMARY: Anticoagulation Episode Summary    Current INR goal 2.0-3.0  Next INR check 01/05/2015  INR from last check 3.70! (12/08/2014)  Weekly max dose   Target end date Indefinite  [facility]  Preferred lab   Send INR reminders to    Indications  Pulmonary embolism [I26.99]        Comments         ANTICOAG TODAY: Anticoagulation Summary as of 12/08/2014    INR goal 2.0-3.0  Selected INR 3.70! (12/08/2014)  Next INR check 01/05/2015  Target end date Indefinite   Indications  Pulmonary embolism [I26.99]      Anticoagulation Episode Summary    [facility] check location [facility]   Preferred lab    Send INR reminders to    Comments       PATIENT INSTRUCTIONS: Patient Instructions  Patient instructed to take medications as defined in the Anti-coagulation Track section of this encounter.  Patient instructed to take today's dose.  Patient verbalized understanding of these instructions.       FOLLOW-UP Return in 4 weeks (on 01/05/2015) for Follow up INR at 0900h.  [name], III Pharm.D., CACP

## 2014-12-08 NOTE — Progress Notes (Signed)
Internal Medicine Clinic Attending  Case discussed with Dr. Kazibwe soon after the resident saw the patient.  We reviewed the resident's history and exam and pertinent patient test results.  I agree with the assessment, diagnosis, and plan of care documented in the resident's note. 

## 2014-12-08 NOTE — Assessment & Plan Note (Signed)
Assessment: Most likely diagnosis abnormal gait related with left knee instability as a result of a combination of AO and left sided weakness. I believe the mechanics of his walking has been altered in such a way that he will have advanced OA and straining of the left knee. Predisposing factors stroke with increased dependency on the right leg and abnormal gait on the left side. He has used a cane for a long time. However, left knee x ray from 07/2014 revealed only mild OA. Previous steroid knee injection by Dr Eppie Gibson was not helpful. I again discussed with him regarding treatment options including narcotics, hyglan injection and referral to ortho for consideration of knee replacement. The main issues not is increased risk of falls and I am indicated to him that I am worried that if he continues to do poorly, he might end up requiring a wheelchair for safety reasons. I offered referral to PT for evaluation of his gait and need for assistive devices.  Plan: 1. Labs/imaging: CBC and BMP given a long-term use of NSAIDs  2. Therapy: Referral to PT (gait and need for escalation of assistive devices) and orthopedics (consideration for knee replacement). Continue with Meloxicam, and Voltaren gel 3. Follow up : follow up with Dr Eppie Gibson in 2-3 months.

## 2014-12-08 NOTE — Patient Instructions (Signed)
Patient instructed to take medications as defined in the Anti-coagulation Track section of this encounter.  Patient instructed to take today's dose.  Patient verbalized understanding of these instructions.    

## 2014-12-08 NOTE — Progress Notes (Signed)
Patient ID: Alexander English, male   DOB: 1956/03/12, 59 y.o.   MRN: [id number]   Subjective:   HPI: [name] is a 59 y.o. gentleman with past medical history below presents for evaluation of his chronic left knee pain.   Patient states that his left knee is bothering him more often now. He has pain usually when he walks and he has experienced increased instability when he stands up from a seated position. Last Sunday he had an episode when he fell backward while was in church. He was fortunate because there was a chair behind him. His left knee gave away with pain and he could not hold himself. He states that sometimes he feels like his left knee buckles backward and he reports pain mostly in the back of the knee. The right knees does not bother him. He has a history of stroke with left sided weakness and he has been using a walking cane for ambulation for several years now. He uses a brace for his left leg.   His knee x ray from 07/2014 revealed only mild OA and was stable compared to the imaging from 2013. He was recommended to use Voltaren gel. He has used Steroid injection in his knee without much relief. He takes Meloxicam about 1-2 times a week for back pain which is chronic for him.   Past Medical History  Diagnosis Date  . Embolic stroke involving right middle cerebral artery 08/08/2004    Occured after traveling from [place] to [place] where he was hospitalized for 1 month with no further work-up or therapy.  Returned to [place] unable to walk and was admitted to [facility] immediately after landing. Presumed embolic per neuro but TEE, bubble study, and hypercoag W/U negative. On indefinite coumadin per patient's informed preference.  Residual effects : Left spastic hemiplegia. Tx with phenol tibi  . Pulmonary embolism 09/30/2004    Diagnosed in April 2006 after returning from [place].  Likely provoked as it occurred after a 12 hour plane flight within 2 months of R MCA CVA with left  hemiparesis. Patient has made an informed decision to remain on lifelong warfarin.   . Chronic low back pain 06/12/2013    L4-5 facet arthropathy   . Osteoarthritis of both knees 01/26/2012  . Chronic pain of both shoulders 12/07/2013    A/C joint osteoarthritis   . Obesity (BMI 30.0-34.9) 12/07/2013  . Chronic constipation 06/12/2013  . Positive PPD 12/07/2013  . Cluster headaches     Resolved in 2006  . Hyperplastic colon polyp 07/27/2012    Excised endoscopically 07/27/2012    ROS: Constitutional:  Denies fevers, chills, diaphoresis, appetite change and fatigue.  Respiratory: Denies SOB, DOE, cough, chest tightness, and wheezing.  CVS: No chest pain, palpitations and leg swelling.  GI: No abdominal pain, nausea, vomiting, bloody stools GU: No dysuria, frequency, hematuria, or flank pain.  Psych: No depression symptoms. No SI or SA.    Objective:  Physical Exam: Filed Vitals:   12/08/14 0833  BP: 112/69  Pulse: 76  Temp: 98.3 F (36.8 C)  TempSrc: Oral  Height: 6' (1.829 m)  Weight: 221 lb 9.6 oz (100.517 kg)  SpO2: 100%   General: very pleasant gentleman. Well nourished. No acute distress.  HEENT: Normal oral mucosa. MMM.  Lungs: CTA bilaterally. No wheezing. Heart: RRR; no extra sounds or murmurs  Abdomen: Non-distended, normal bowel sounds, soft, nontender; no hepatosplenomegaly  Extremities: No pedal edema. No joint swelling or tenderness. Hyperextension of  the left knee without increased tenderness on palpation, no swelling and no increased warmth. No crepitus. Anterior and posterior drawer signs are both negative. No palpable Baker's cyst. Uses a walking cane. His staggers a little when he attempts to stand and he needs to hold himself on the chair to stabilize but he does not need support.  Neurologic: Normal EOM,  Alert and oriented x3. No obvious neurologic/cranial nerve deficits.  Assessment & Plan:  Discussed case with Dr Lynnae January. See problem based charting for  assessment and plan.

## 2014-12-15 ENCOUNTER — Encounter: Payer: Self-pay | Admitting: *Deleted

## 2014-12-16 NOTE — Addendum Note (Signed)
Addended by: Hulan Fray on: 12/16/2014 09:53 PM   Modules accepted: Orders

## 2014-12-22 DIAGNOSIS — M21372 Foot drop, left foot: Secondary | ICD-10-CM | POA: Diagnosis not present

## 2014-12-22 DIAGNOSIS — M1712 Unilateral primary osteoarthritis, left knee: Secondary | ICD-10-CM | POA: Diagnosis not present

## 2014-12-22 DIAGNOSIS — M21962 Unspecified acquired deformity of left lower leg: Secondary | ICD-10-CM | POA: Diagnosis not present

## 2014-12-31 ENCOUNTER — Ambulatory Visit: Payer: Medicare HMO

## 2015-01-02 ENCOUNTER — Telehealth: Payer: Self-pay | Admitting: Pharmacist

## 2015-01-02 NOTE — Telephone Encounter (Signed)
Call to patient to confirm appointment for 01/05/15 at 9:30 lmtcb

## 2015-01-05 ENCOUNTER — Ambulatory Visit (INDEPENDENT_AMBULATORY_CARE_PROVIDER_SITE_OTHER): Payer: Commercial Managed Care - HMO | Admitting: Pharmacist

## 2015-01-05 DIAGNOSIS — Z7901 Long term (current) use of anticoagulants: Secondary | ICD-10-CM

## 2015-01-05 DIAGNOSIS — I2699 Other pulmonary embolism without acute cor pulmonale: Secondary | ICD-10-CM | POA: Diagnosis not present

## 2015-01-05 LAB — POCT INR: INR: 2

## 2015-01-05 NOTE — Progress Notes (Signed)
Anti-Coagulation Progress Note  Alexander English is a 58 y.o. male who is currently on an anti-coagulation regimen.    RECENT RESULTS: Recent results are below, the most recent result is correlated with a dose of 25 mg. per week: Lab Results  Component Value Date   INR 2.00 01/05/2015   INR 3.70 12/08/2014   INR 3.00 11/10/2014    ANTI-COAG DOSE: Anticoagulation Dose Instructions as of 01/05/2015      [name] Tue Wed Thu Fri Sat   New Dose 5 mg 2.5 mg 5 mg 2.5 mg 5 mg 2.5 mg 5 mg       ANTICOAG SUMMARY: Anticoagulation Episode Summary    Current INR goal 2.0-3.0  Next INR check 02/09/2015  INR from last check 2.00 (01/05/2015)  Weekly max dose   Target end date Indefinite  [facility]  Preferred lab   Send INR reminders to    Indications  Pulmonary embolism [I26.99]        Comments         ANTICOAG TODAY: Anticoagulation Summary as of 01/05/2015    INR goal 2.0-3.0  Selected INR 2.00 (01/05/2015)  Next INR check 02/09/2015  Target end date Indefinite   Indications  Pulmonary embolism [I26.99]      Anticoagulation Episode Summary    [facility] check [facility]   Preferred lab    Send INR reminders to    Comments       PATIENT INSTRUCTIONS: Patient Instructions  Patient instructed to take medications as defined in the Anti-coagulation Track section of this encounter.  Patient instructed to take today's dose.  Patient verbalized understanding of these instructions.      FOLLOW-UP Return in 5 weeks (on 02/09/2015) for Follow up INR at 0945h.  [name], III Pharm.D., CACP

## 2015-01-05 NOTE — Patient Instructions (Signed)
Patient instructed to take medications as defined in the Anti-coagulation Track section of this encounter.  Patient instructed to take today's dose.  Patient verbalized understanding of these instructions.    

## 2015-01-06 NOTE — Progress Notes (Signed)
I have reviewed Dr. Gladstone Pih note.  Patient on anticoagulation for PE.

## 2015-01-12 ENCOUNTER — Encounter: Payer: Self-pay | Admitting: Physical Therapy

## 2015-01-12 ENCOUNTER — Ambulatory Visit: Payer: Commercial Managed Care - HMO | Attending: Internal Medicine | Admitting: Physical Therapy

## 2015-01-12 DIAGNOSIS — M25562 Pain in left knee: Secondary | ICD-10-CM

## 2015-01-12 DIAGNOSIS — I69959 Hemiplegia and hemiparesis following unspecified cerebrovascular disease affecting unspecified side: Secondary | ICD-10-CM

## 2015-01-12 DIAGNOSIS — R2681 Unsteadiness on feet: Secondary | ICD-10-CM

## 2015-01-12 DIAGNOSIS — R269 Unspecified abnormalities of gait and mobility: Secondary | ICD-10-CM | POA: Diagnosis not present

## 2015-01-12 NOTE — Addendum Note (Signed)
Addended by: Raeford Razor L on: 01/12/2015 10:18 AM   Modules accepted: Medications

## 2015-01-12 NOTE — Therapy (Signed)
[place], [place], [address] Phone: [phone]   Fax:  [phone]  Physical Therapy Evaluation  Patient Details  Name: Alexander English MRN: [id number] Date of Birth: [birth year] Referring Provider:  Jessee Avers, MD  Encounter Date: 01/12/2015      PT End of Session - 01/12/15 0938    Visit Number 1   Number of Visits 12   PT Start Time 0847   PT Stop Time 0945   PT Time Calculation (min) 58 min   Activity Tolerance Patient tolerated treatment well   Behavior During Therapy [facility] for tasks assessed/performed      Past Medical History  Diagnosis Date  . Embolic stroke involving right middle cerebral artery 08/08/2004    Occured after traveling from [place] to [place] where he was hospitalized for 1 month with no further work-up or therapy.  Returned to [place] unable to walk and was admitted to [facility] immediately after landing. Presumed embolic per neuro but TEE, bubble study, and hypercoag W/U negative. On indefinite coumadin per patient's informed preference.  Residual effects : Left spastic hemiplegia. Tx with phenol tibi  . Pulmonary embolism 09/30/2004    Diagnosed in April 2006 after returning from [place].  Likely provoked as it occurred after a 12 hour plane flight within 2 months of R MCA CVA with left hemiparesis. Patient has made an informed decision to remain on lifelong warfarin.   . Chronic low back pain 06/12/2013    L4-5 facet arthropathy   . Osteoarthritis of both knees 01/26/2012  . Chronic pain of both shoulders 12/07/2013    A/C joint osteoarthritis   . Obesity (BMI 30.0-34.9) 12/07/2013  . Chronic constipation 06/12/2013  . Positive PPD 12/07/2013  . Cluster headaches     Resolved in 2006  . Hyperplastic colon polyp 07/27/2012    Excised endoscopically 07/27/2012    Past Surgical History  Procedure Laterality Date  . Foot surgery Bilateral     Bunion  . I&d extremity Left 11/28/2001   Left thumb thenar space abscess.    There were no vitals filed for this visit.  Visit Diagnosis:  Hemiplegia of nondominant side, late effect of cerebrovascular disease - Plan: PT plan of care cert/re-cert  Lateral knee pain, left - Plan: PT plan of care cert/re-cert  Gait instability - Plan: PT plan of care cert/re-cert  Abnormality of gait - Plan: PT plan of care cert/re-cert      Subjective Assessment - 01/12/15 0849    Subjective Patient presents with bilateral knee pain, chronic in nature which has worsened over the past yr.  R knee asymptomatic.  L knee pain, weakness, difficulty walking, pain radiates to L hip and back at times.  He  is unable to work and walk as previously.    Pertinent History CVA 2006 resulting in L hemiparesis UE and LE.     Limitations Standing;Walking;House hold activities;Lifting  ADLs, getting into shower.    How long can you stand comfortably? with full WB, not comfortable    How long can you walk comfortably? increases at 5-10 min    Diagnostic tests XR in knees   Patient Stated Goals i just want this pain to go away   Currently in Pain? Yes   Pain Score 5   with gait.  min to none at rest.    Pain Location Knee   Pain Orientation Left   Pain Descriptors / Indicators Aching;Sore   Pain Type Chronic  pain   Pain Radiating Towards L thigh   Pain Onset More than a month ago   Pain Frequency Intermittent   Aggravating Factors  weightbearing   Pain Relieving Factors voltaren gel, rest/sitting, min relief from Meloxicam   Effect of Pain on Daily Activities painful for basic activities   Multiple Pain Sites No            [facility] PT Assessment - 01/12/15 0857    Assessment   Medical Diagnosis L knee pain    Onset Date/Surgical Date --  chronic 10 yrs    Hand Dominance Right   Prior Therapy for low back, also for CVA   Precautions   Precautions None   Required Braces or Orthoses Other Brace/Splint   Other Brace/Splint L AFO (plastic) also  reports having another one for dress shoes, will bring in    Restrictions   Weight Bearing Restrictions No   Balance Screen   Has the patient fallen in the past 6 months Yes  lost balance 2-3 weeks ago, fell back onto bed   How many times? 1   Has the patient had a decrease in activity level because of a fear of falling?  Yes   Is the patient reluctant to leave their home because of a fear of falling?  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   Leisure walking, working out   [employer]   Overall Cognitive Status Within Functional Limits for tasks assessed   Observation/Other Assessments   Focus on Therapeutic Outcomes (FOTO)  NT   Sensation   Light Touch Impaired by gross assessment  impaired on L LE   Coordination   Gross Motor Movements are Fluid and Coordinated Not tested   Fine Motor Movements are Fluid and Coordinated Not tested  limited LUE   Posture/Postural Control   Postural Limitations Flexed trunk;Weight shift right   Posture Comments L UE not ablle to assist with function, mobility, L knee recurvatum in stance, held in hip ER and ankle PF   Tone   Assessment Location Left Lower Extremity   AROM   Right/Left Hip Left  decr in sitting due to strength   Left Hip Internal Rotation  0   Right/Left Knee --  L knee limited due to weakness   PROM   PROM Assessment Site Knee   Left Hip Internal Rotation  30  tight end range ("feels good")   Left Knee Extension 16   Left Knee  Flexion 130   Left Ankle Dorsiflexion 0   Strength   Right Hip Flexion 5/5   Right Hip ABduction 4/5   Left Hip Flexion 2/5   Left Hip ABduction 3-/5   Right Knee Flexion 5/5   Right Knee Extension 5/5   Left Knee Flexion 2+/5   Left Knee Extension 3+/5   Right Ankle Dorsiflexion 5/5   Left Ankle Dorsiflexion 0/5   Flexibility   Quadriceps tight   ITB tight   Palpation   Patella mobility hypomobile, no pain    Palpation comment  sore L post knee (pop fossa)   Transfers   Sit to Stand 6: Modified independent (Device/Increase time)   Supine to Sit 5: Supervision;4: Min assist   Sit to Supine 4: Min assist   Ambulation/Gait   Ambulation/Gait Yes   Ambulation/Gait Assistance 6: Modified independent (Device/Increase time)   Ambulation Distance (Feet) 150 Feet   Assistive device Straight cane   Gait Pattern Decreased step length - left;Decreased stance time - left;Decreased hip/knee flexion - left;Decreased dorsiflexion - left;Decreased weight shift to left;Left circumduction;Left hip hike   LLE Tone   LLE Tone Mild           PT Education - Jan 30, 2015 0938    Education provided Yes   Education Details PT/POC, IFC, bracing options, and PT prognosis   Person(s) Educated Patient   Methods Explanation   Comprehension Verbalized understanding;Returned demonstration;Need further instruction             PT Long Term Goals - January 30, 2015 1002    PT LONG TERM GOAL #1   Title Pt will be I with HEP for LLE as of last appt   Time 6   Period Weeks   Status New   PT LONG TERM GOAL #2   Title Pt will report more confidence in LLE due to less pain overall with gait   Time 6   Period Weeks   Status New   PT LONG TERM GOAL #3   Title Pt will be able to report less pain overall with gait and stairs by 25%    Time 6   Status New               Plan - 2015/01/30 1561    Clinical Impression Statement This patient presents with abnormal gait due to chronic neurological weakness.   His lack of knee control and impaired ankle strenght/motion result in gait with a locked, hyperextended knee which over time has become painful.  He will benefit from manual stretching, quad/hip strengthening and modalities for pain.  He may also benefit from an orthotic consult to explore bracing options to preserve knee joint.     Pt will benefit from skilled therapeutic intervention in order to improve on the following deficits Abnormal gait;Decreased coordination;Decreased range of motion;Difficulty walking;Increased fascial restricitons;Impaired tone;Impaired UE functional use;Increased muscle spasms;Decreased activity tolerance;Pain;Improper body mechanics;Impaired flexibility;Hypomobility;Decreased balance;Decreased knowledge of use of DME;Decreased mobility;Decreased strength;Impaired sensation;Postural dysfunction   Rehab Potential Fair   Clinical Impairments Affecting Rehab Potential Patient will be limited in progress and HEP performance due to lack of function in LUE   PT Frequency 2x / week   PT Duration 6 weeks  if progressing towards goals   PT Treatment/Interventions ADLs/Self Care Home Management;Functional mobility training;Patient/family education;Passive range of motion;Therapeutic activities;Moist Heat;Therapeutic exercise;Cryotherapy;Electrical Stimulation;DME Instruction;Gait training;Stair training;Manual techniques;Neuromuscular re-education;Balance training   PT Next Visit Plan manual stretch to L knee and hip, SAQ, LAQ and self stretch options for knee/hip, modalities if needed   PT Home Exercise Plan unable to give today.    Recommended Other Services ortho/prosthetics   Consulted and Agree with Plan of Care Patient          G-Codes - 01/30/2015 1005    Functional Assessment Tool Used clinical judgement   Functional Limitation Mobility: Walking and moving around   Mobility: Walking and Moving Around Current Status (B3794) At least 60 percent but less than 80 percent  impaired, limited or restricted   Mobility: Walking and Moving Around Goal Status (F2761) At  least 40 percent but less than 60 percent impaired, limited or restricted       Problem List Patient Active Problem List   Diagnosis Date Noted  . Acute upper respiratory infection 08/12/2014  . Health care maintenance 07/28/2014  . Tinnitus 07/28/2014  . Chronic pain of both shoulders 12/07/2013  . Positive PPD 12/07/2013  . Obesity (BMI 30.0-34.9) 12/07/2013  . Chronic low back pain 06/12/2013  . Chronic constipation 06/12/2013  . Preventative health care 03/21/2012  . Osteoarthritis of both knees 01/26/2012  . Hyperlipidemia 11/16/2006  . Pulmonary embolism 09/30/2004  . Embolic stroke involving right middle cerebral artery 08/08/2004    PAA,JENNIFER 01/12/2015, 10:13 AM  [facility] [address] [place], [place], [address] Phone: [phone]   Fax:  [phone]  Raeford Razor, PT 01/12/2015 10:14 AM Phone: [phone] Fax: [phone]

## 2015-01-19 ENCOUNTER — Ambulatory Visit: Payer: Commercial Managed Care - HMO | Admitting: Rehabilitative and Restorative Service Providers"

## 2015-01-19 ENCOUNTER — Encounter: Payer: Self-pay | Admitting: Rehabilitative and Restorative Service Providers"

## 2015-01-19 DIAGNOSIS — R269 Unspecified abnormalities of gait and mobility: Secondary | ICD-10-CM | POA: Diagnosis not present

## 2015-01-19 DIAGNOSIS — R2681 Unsteadiness on feet: Secondary | ICD-10-CM | POA: Diagnosis not present

## 2015-01-19 DIAGNOSIS — I69959 Hemiplegia and hemiparesis following unspecified cerebrovascular disease affecting unspecified side: Secondary | ICD-10-CM | POA: Diagnosis not present

## 2015-01-19 DIAGNOSIS — M25562 Pain in left knee: Secondary | ICD-10-CM | POA: Diagnosis not present

## 2015-01-19 NOTE — Patient Instructions (Signed)
Educated pt on icing instructions of 15 to 20 min instead of pts 1 hr icing. Discussed longer brace to hip and it offering more stability vs current braces with pt declining hip brace due to concerns of toileting in community. Discussed prosthetist would compensate due to deficits with fabrication.

## 2015-01-19 NOTE — Therapy (Signed)
[place], [place], [address] Phone: [phone]   Fax:  [phone]  Physical Therapy Treatment  Patient Details  Name: Alexander English MRN: [id number] Date of Birth: 11/05/1955 Referring Provider:  [name], MD  Encounter Date: 01/19/2015      PT End of Session - 01/19/15 0939    Visit Number 2   Number of Visits 12   PT Start Time 9169   PT Stop Time 0941   PT Time Calculation (min) 54 min   Activity Tolerance Patient tolerated treatment well   Behavior During Therapy [facility] for tasks assessed/performed      Past Medical History  Diagnosis Date  . Embolic stroke involving right middle cerebral artery 08/08/2004    Occured after traveling from [place] to [place] where he was hospitalized for 1 month with no further work-up or therapy.  Returned to [place] unable to walk and was admitted to [facility] immediately after landing. Presumed embolic per neuro but TEE, bubble study, and hypercoag W/U negative. On indefinite coumadin per patient's informed preference.  Residual effects : Left spastic hemiplegia. Tx with phenol tibi  . Pulmonary embolism 09/30/2004    Diagnosed in April 2006 after returning from [place].  Likely provoked as it occurred after a 12 hour plane flight within 2 months of R MCA CVA with left hemiparesis. Patient has made an informed decision to remain on lifelong warfarin.   . Chronic low back pain 06/12/2013    L4-5 facet arthropathy   . Osteoarthritis of both knees 01/26/2012  . Chronic pain of both shoulders 12/07/2013    A/C joint osteoarthritis   . Obesity (BMI 30.0-34.9) 12/07/2013  . Chronic constipation 06/12/2013  . Positive PPD 12/07/2013  . Cluster headaches     Resolved in 2006  . Hyperplastic colon polyp 07/27/2012    Excised endoscopically 07/27/2012    Past Surgical History  Procedure Laterality Date  . Foot surgery Bilateral     Bunion  . I&d extremity Left 11/28/2001   Left thumb thenar space abscess.    There were no vitals filed for this visit.  Visit Diagnosis:  Lateral knee pain, left  Gait instability      Subjective Assessment - 01/19/15 0854    Subjective I only want to wear my other brace for church. it hurts in the back of my L knee.   Pertinent History CVA 2006 resulting in L hemiparesis UE and LE.     Limitations Standing;Walking;House hold activities;Lifting   How long can you sit comfortably? as long as possible   How long can you stand comfortably? with full WB, not comfortable    How long can you walk comfortably? increases at 5-10 min    Diagnostic tests XR in knees   Patient Stated Goals i just want this pain to go away   Currently in Pain? Yes   Pain Score 7    Pain Location Knee   Pain Orientation Posterior;Left   Pain Descriptors / Indicators Aching;Sore   Pain Type Chronic pain   Pain Radiating Towards L lateral thigh   Pain Onset More than a month ago   Pain Frequency Intermittent   Aggravating Factors  Weightbearing   Pain Relieving Factors voltaren gel, resting   Effect of Pain on Daily Activities painful especially with transfers   Multiple Pain Sites No  [place] Adult PT Treatment/Exercise - 01/19/15 0001    Ambulation/Gait   Gait Comments assessed  gait with plastic AFO with excessive knee recurvatum and with alternate leg brace even more recurvatum. recommended use of plastic AFO over alt   Ultrasound   Ultrasound Location L medial knee   Ultrasound Parameters 50%, 1.5 w/cm2 x 8 min   Ultrasound Goals Pain   Manual Therapy   Manual Therapy Soft tissue mobilization   Manual therapy comments soft tissue work to L hamstring                PT Education - 01/19/15 0915    Education provided Yes   Education Details see pt instruction section   Person(s) Educated Patient   Methods Explanation   Comprehension Verbalized understanding             PT Long  Term Goals - 01/19/15 2585    PT LONG TERM GOAL #1   Title Pt will be I with HEP for LLE as of last appt   Time 6   Period Weeks   Status On-going   PT LONG TERM GOAL #2   Title Pt will report more confidence in LLE due to less pain overall with gait   Time 6   Period Weeks   Status On-going   PT LONG TERM GOAL #3   Title Pt will be able to report less pain overall with gait and stairs by 25%    Time 6   Period Weeks   Status On-going               Plan - 01/19/15 2778    Clinical Impression Statement The pt demos excessive L knee recurvatum with WB which is increasing hamstring strain.    Pt will benefit from skilled therapeutic intervention in order to improve on the following deficits Abnormal gait;Decreased coordination;Decreased range of motion;Difficulty walking;Increased fascial restricitons;Impaired tone;Impaired UE functional use;Increased muscle spasms;Decreased activity tolerance;Pain;Improper body mechanics;Impaired flexibility;Hypomobility;Decreased balance;Decreased knowledge of use of DME;Decreased mobility;Decreased strength;Impaired sensation;Postural dysfunction   Rehab Potential Fair   Clinical Impairments Affecting Rehab Potential Patient will be limited in progress and HEP performance due to lack of function in LUE   PT Frequency 2x / week   PT Duration 6 weeks   PT Treatment/Interventions ADLs/Self Care Home Management;Functional mobility training;Patient/family education;Passive range of motion;Therapeutic activities;Moist Heat;Therapeutic exercise;Cryotherapy;Electrical Stimulation;DME Instruction;Gait training;Stair training;Manual techniques;Neuromuscular re-education;Balance training   PT Next Visit Plan review HEP, cont hamstring strengthening   PT Home Exercise Plan heel slides in R sidelying and supine educating pt on not using hip flexors as compensation, supine ankle pumps with Ant Tib contraction palpated; advised pt to consciously try to decrease  knee recurvatum with gait   Recommended Other Services prosthetist   Consulted and Agree with Plan of Care Patient        Problem List Patient Active Problem List   Diagnosis Date Noted  . Acute upper respiratory infection 08/12/2014  . Health care maintenance 07/28/2014  . Tinnitus 07/28/2014  . Chronic pain of both shoulders 12/07/2013  . Positive PPD 12/07/2013  . Obesity (BMI 30.0-34.9) 12/07/2013  . Chronic low back pain 06/12/2013  . Chronic constipation 06/12/2013  . Preventative health care 03/21/2012  . Osteoarthritis of both knees 01/26/2012  . Hyperlipidemia 11/16/2006  . Pulmonary embolism 09/30/2004  . Embolic stroke involving right middle cerebral artery 08/08/2004    Myra Rude, PT, DPT 01/19/2015, 9:43 AM  [facility]  [place], [place], [address] Phone: [phone]   Fax:  [phone]

## 2015-01-27 ENCOUNTER — Other Ambulatory Visit: Payer: Self-pay | Admitting: Internal Medicine

## 2015-01-27 DIAGNOSIS — G8929 Other chronic pain: Secondary | ICD-10-CM

## 2015-01-27 DIAGNOSIS — M545 Low back pain: Principal | ICD-10-CM

## 2015-01-30 ENCOUNTER — Ambulatory Visit
Payer: Commercial Managed Care - HMO | Attending: Internal Medicine | Admitting: Rehabilitative and Restorative Service Providers"

## 2015-01-30 DIAGNOSIS — M25562 Pain in left knee: Secondary | ICD-10-CM | POA: Insufficient documentation

## 2015-01-30 DIAGNOSIS — I69959 Hemiplegia and hemiparesis following unspecified cerebrovascular disease affecting unspecified side: Secondary | ICD-10-CM | POA: Insufficient documentation

## 2015-01-30 DIAGNOSIS — R269 Unspecified abnormalities of gait and mobility: Secondary | ICD-10-CM | POA: Diagnosis not present

## 2015-01-30 DIAGNOSIS — R2681 Unsteadiness on feet: Secondary | ICD-10-CM | POA: Diagnosis not present

## 2015-01-30 NOTE — Therapy (Signed)
[place], [place], [address] Phone: [phone]   Fax:  [phone]  Physical Therapy Treatment  Patient Details  Name: Alexander English MRN: [id number] Date of Birth: Apr 15, 1956 Referring Provider:  [name], MD  Encounter Date: 01/30/2015      PT End of Session - 01/30/15 9924    Visit Number 3   Number of Visits 12   PT Start Time 0837   PT Stop Time 0927   PT Time Calculation (min) 50 min   Activity Tolerance Patient tolerated treatment well   Behavior During Therapy [facility] for tasks assessed/performed      Past Medical History  Diagnosis Date  . Embolic stroke involving right middle cerebral artery 08/08/2004    Occured after traveling from [place] to [place] where he was hospitalized for 1 month with no further work-up or therapy.  Returned to [place] unable to walk and was admitted to [facility] immediately after landing. Presumed embolic per neuro but TEE, bubble study, and hypercoag W/U negative. On indefinite coumadin per patient's informed preference.  Residual effects : Left spastic hemiplegia. Tx with phenol tibi  . Pulmonary embolism 09/30/2004    Diagnosed in April 2006 after returning from [place].  Likely provoked as it occurred after a 12 hour plane flight within 2 months of R MCA CVA with left hemiparesis. Patient has made an informed decision to remain on lifelong warfarin.   . Chronic low back pain 06/12/2013    L4-5 facet arthropathy   . Osteoarthritis of both knees 01/26/2012  . Chronic pain of both shoulders 12/07/2013    A/C joint osteoarthritis   . Obesity (BMI 30.0-34.9) 12/07/2013  . Chronic constipation 06/12/2013  . Positive PPD 12/07/2013  . Cluster headaches     Resolved in 2006  . Hyperplastic colon polyp 07/27/2012    Excised endoscopically 07/27/2012    Past Surgical History  Procedure Laterality Date  . Foot surgery Bilateral     Bunion  . I&d extremity Left 11/28/2001    Left  thumb thenar space abscess.    There were no vitals filed for this visit.  Visit Diagnosis:  Lateral knee pain, left  Gait instability      Subjective Assessment - 01/30/15 0848    Subjective My knee mainly hurts when I get up from a seated position and first begin to walk and the pain shoots up my leg   Pertinent History CVA 2006 resulting in L hemiparesis UE and LE.     Limitations Standing;Walking;House hold activities;Lifting   How long can you sit comfortably? as long as possible   How long can you stand comfortably? with full WB, not comfortable    How long can you walk comfortably? increases at 5-10 min    Diagnostic tests XR in knees   Patient Stated Goals i just want this pain to go away   Currently in Pain? Yes   Pain Score 4    Pain Location Knee   Pain Orientation Left;Posterior   Pain Descriptors / Indicators Aching   Pain Type Chronic pain   Pain Radiating Towards L lateral thigh   Pain Onset More than a month ago   Pain Frequency Intermittent   Aggravating Factors  weightbearing   Pain Relieving Factors voltaren gel, resting   Effect of Pain on Daily Activities pain after transfers   Multiple Pain Sites No  [place] Adult PT Treatment/Exercise - 01/30/15 0001    Exercises   Exercises --  Review HEP   Knee/Hip Exercises: Supine   Other Supine Knee/Hip Exercises supine heel slides from various angles of hip flexion x 20 each to facilitate more hamstring contraction; with bolster under knees performing hamstring dig x 20 with 1-3 sec hold; L sidelying L knee flexion x 20 x 3 sets   Ultrasound   Ultrasound Location L medial and central Hamstring in L sidelying   Ultrasound Parameters 50% 1.5w/cm2 x 8 min   Ultrasound Goals Pain   Manual Therapy   Manual Therapy Soft tissue mobilization   Manual therapy comments L medial and central distal Hamstring for pain and tightness                     PT Long Term  Goals - 01/30/15 0930    PT LONG TERM GOAL #1   Title Pt will be I with HEP for LLE as of last appt   Time 6   Period Weeks   Status Achieved   PT LONG TERM GOAL #2   Title Pt will report more confidence in LLE due to less pain overall with gait   Time 6   Period Weeks   Status On-going   PT LONG TERM GOAL #3   Title Pt will be able to report less pain overall with gait and stairs by 25%    Time 6   Period Weeks   Status On-going   PT LONG TERM GOAL #4   Title Pt will report at least 50% improvement in L posterior knee pain with mobility   Time 6   Period Weeks   Status New               Plan - 01/30/15 7253    Clinical Impression Statement The pt demos improved recurvatum and control in supine and sidelying with hamstring facilitation. in WB, deficits are as at eval. pt reports pain to be improved with improved hamstring control.   Pt will benefit from skilled therapeutic intervention in order to improve on the following deficits Abnormal gait;Decreased coordination;Decreased range of motion;Difficulty walking;Increased fascial restricitons;Impaired tone;Impaired UE functional use;Increased muscle spasms;Decreased activity tolerance;Pain;Improper body mechanics;Impaired flexibility;Hypomobility;Decreased balance;Decreased knowledge of use of DME;Decreased mobility;Decreased strength;Impaired sensation;Postural dysfunction   Rehab Potential Fair   Clinical Impairments Affecting Rehab Potential Patient will be limited in progress and HEP performance due to lack of function in LUE   PT Frequency 2x / week   PT Duration 6 weeks   PT Treatment/Interventions ADLs/Self Care Home Management;Functional mobility training;Patient/family education;Passive range of motion;Therapeutic activities;Moist Heat;Therapeutic exercise;Cryotherapy;Electrical Stimulation;DME Instruction;Gait training;Stair training;Manual techniques;Neuromuscular re-education;Balance training   PT Next Visit Plan  cont hamstring strengthening various angles with/without bolster, gravity eliminated hamstring strengthening, standing hamstring strengthening   PT Home Exercise Plan only initial HEP issued   Recommended Other Services new prosthetist        Problem List Patient Active Problem List   Diagnosis Date Noted  . Acute upper respiratory infection 08/12/2014  . Health care maintenance 07/28/2014  . Tinnitus 07/28/2014  . Chronic pain of both shoulders 12/07/2013  . Positive PPD 12/07/2013  . Obesity (BMI 30.0-34.9) 12/07/2013  . Chronic low back pain 06/12/2013  . Chronic constipation 06/12/2013  . Preventative health care 03/21/2012  . Osteoarthritis of both knees 01/26/2012  . Hyperlipidemia 11/16/2006  . Pulmonary embolism 09/30/2004  . Embolic stroke involving right middle cerebral artery 08/08/2004  Myra Rude, PT 01/30/2015, 9:33 AM  [place], [place], [address] Phone: [phone]   Fax:  [phone]

## 2015-01-30 NOTE — Patient Instructions (Signed)
Discussed with pt phoning MD regarding unsuccessful orthotist appt, [facility], who stated they were unable to fabricate pt a brace that would work for him. PT recommended consulting with MD approval another orthotist or even a teaching hospital about fabrication; discussed limiting and controlling recurvatum with gait. Reviewed HEP with I performance

## 2015-02-02 ENCOUNTER — Ambulatory Visit: Payer: Commercial Managed Care - HMO | Admitting: Physical Therapy

## 2015-02-02 DIAGNOSIS — R269 Unspecified abnormalities of gait and mobility: Secondary | ICD-10-CM

## 2015-02-02 DIAGNOSIS — R2681 Unsteadiness on feet: Secondary | ICD-10-CM | POA: Diagnosis not present

## 2015-02-02 DIAGNOSIS — I69959 Hemiplegia and hemiparesis following unspecified cerebrovascular disease affecting unspecified side: Secondary | ICD-10-CM | POA: Diagnosis not present

## 2015-02-02 DIAGNOSIS — M25562 Pain in left knee: Secondary | ICD-10-CM | POA: Diagnosis not present

## 2015-02-02 NOTE — Therapy (Signed)
[place], [place], [address] Phone: [phone]   Fax:  [phone]  Physical Therapy Treatment  Patient Details  Name: Alexander English MRN: [id number] Date of Birth: 28-Jan-1956 Referring Provider:  [name], MD  Encounter Date: 02/02/2015      PT End of Session - 02/02/15 0942    Visit Number 4   Number of Visits 12   PT Start Time 0845   PT Stop Time 0932   PT Time Calculation (min) 47 min   Activity Tolerance Patient tolerated treatment well      Past Medical History  Diagnosis Date  . Embolic stroke involving right middle cerebral artery 08/08/2004    Occured after traveling from [place] to [place] where he was hospitalized for 1 month with no further work-up or therapy.  Returned to [place] unable to walk and was admitted to [facility] immediately after landing. Presumed embolic per neuro but TEE, bubble study, and hypercoag W/U negative. On indefinite coumadin per patient's informed preference.  Residual effects : Left spastic hemiplegia. Tx with phenol tibi  . Pulmonary embolism 09/30/2004    Diagnosed in April 2006 after returning from [place].  Likely provoked as it occurred after a 12 hour plane flight within 2 months of R MCA CVA with left hemiparesis. Patient has made an informed decision to remain on lifelong warfarin.   . Chronic low back pain 06/12/2013    L4-5 facet arthropathy   . Osteoarthritis of both knees 01/26/2012  . Chronic pain of both shoulders 12/07/2013    A/C joint osteoarthritis   . Obesity (BMI 30.0-34.9) 12/07/2013  . Chronic constipation 06/12/2013  . Positive PPD 12/07/2013  . Cluster headaches     Resolved in 2006  . Hyperplastic colon polyp 07/27/2012    Excised endoscopically 07/27/2012    Past Surgical History  Procedure Laterality Date  . Foot surgery Bilateral     Bunion  . I&d extremity Left 11/28/2001    Left thumb thenar space abscess.    There were no vitals  filed for this visit.  Visit Diagnosis:  Lateral knee pain, left  Gait instability  Hemiplegia of nondominant side, late effect of cerebrovascular disease  Abnormality of gait      Subjective Assessment - 02/02/15 0843    Subjective No change, really, still pain when I walk and stand.  Does HEP and is going fairly well.   Currently in Pain? Other (Comment)  No consistent pain only during sit to stand and walking          [facility] Adult PT Treatment/Exercise - 02/02/15 0851    Knee/Hip Exercises: Supine   Heel Slides Strengthening;Left;2 sets;10 reps   Heel Slides Limitations limited ROM due to weakness   Bridges Limitations bridge x 10, 2 sets x 10 ( 1 set on orange ball)   Other Supine Knee/Hip Exercises hamstring set 2 x10 with slight knee flexion   Other Supine Knee/Hip Exercises supine hamstring curl on ball with PT assist for LLE    Knee/Hip Exercises: Prone   Hamstring Curl 2 sets;10 reps   Hamstring Curl Limitations with assist   Other Prone Exercises heel slide in sidelying x 10, max A    Manual Therapy   Kinesiotex Facilitate Muscle   Kinesiotix   Facilitate Muscle  Hamstrings applied 2 "Y'"s with 50% stretch in prone (hamstring stretched)                PT Education -  02/02/15 0941    Education provided Yes   Education Details kinesiotape, post knee pain   Person(s) Educated Patient   Methods Explanation   Comprehension Verbalized understanding             PT Long Term Goals - 01/30/15 0930    PT LONG TERM GOAL #1   Title Pt will be I with HEP for LLE as of last appt   Time 6   Period Weeks   Status Achieved   PT LONG TERM GOAL #2   Title Pt will report more confidence in LLE due to less pain overall with gait   Time 6   Period Weeks   Status On-going   PT LONG TERM GOAL #3   Title Pt will be able to report less pain overall with gait and stairs by 25%    Time 6   Period Weeks   Status On-going   PT LONG TERM GOAL #4   Title Pt will  report at least 50% improvement in L posterior knee pain with mobility   Time 6   Period Weeks   Status New               Plan - 02/02/15 5400    Clinical Impression Statement Patient demos weakness in hamstrings needing assistance to perform hamstring strengthening exercises. Pt. needs verbal and tactile cues for understanding on proper direction of movement and facilitation of his hamstrings. Kinesio tape was applied following treatment to see if patient receives any relief  by next session.   PT Next Visit Plan hamstring strengthening, if tape helped- tape again, Active Assistive motion exercises   PT Home Exercise Plan current HEP   Consulted and Agree with Plan of Care Patient        Problem List Patient Active Problem List   Diagnosis Date Noted  . Acute upper respiratory infection 08/12/2014  . Health care maintenance 07/28/2014  . Tinnitus 07/28/2014  . Chronic pain of both shoulders 12/07/2013  . Positive PPD 12/07/2013  . Obesity (BMI 30.0-34.9) 12/07/2013  . Chronic low back pain 06/12/2013  . Chronic constipation 06/12/2013  . Preventative health care 03/21/2012  . Osteoarthritis of both knees 01/26/2012  . Hyperlipidemia 11/16/2006  . Pulmonary embolism 09/30/2004  . Embolic stroke involving right middle cerebral artery 08/08/2004    Loralyn Rachel 02/02/2015, 11:00 AM  [facility] [address] [place], [place], [address] Phone: [phone]   Fax:  [phone]  Raeford Razor, PT 02/02/2015 11:01 AM Phone: [phone] Fax: [phone]

## 2015-02-06 ENCOUNTER — Ambulatory Visit: Payer: Commercial Managed Care - HMO | Admitting: Rehabilitative and Restorative Service Providers"

## 2015-02-06 DIAGNOSIS — R2681 Unsteadiness on feet: Secondary | ICD-10-CM | POA: Diagnosis not present

## 2015-02-06 DIAGNOSIS — M25562 Pain in left knee: Secondary | ICD-10-CM

## 2015-02-06 DIAGNOSIS — R269 Unspecified abnormalities of gait and mobility: Secondary | ICD-10-CM | POA: Diagnosis not present

## 2015-02-06 DIAGNOSIS — I69959 Hemiplegia and hemiparesis following unspecified cerebrovascular disease affecting unspecified side: Secondary | ICD-10-CM | POA: Diagnosis not present

## 2015-02-06 NOTE — Patient Instructions (Signed)
Advised pt to perform seated heel slides as hamstring facilitation is good in this position

## 2015-02-06 NOTE — Therapy (Addendum)
[place], [place], [address] Phone: [phone]   Fax:  [phone]  Physical Therapy Treatment/Discharge Note  Patient Details  Name: Alexander English MRN: [id number] Date of Birth: 1955-10-22 Referring Provider:  [name], MD  Encounter Date: 02/06/2015      PT End of Session - 02/06/15 0922    Visit Number 5   Number of Visits 12   PT Start Time 0833   PT Stop Time 0922   PT Time Calculation (min) 49 min   Activity Tolerance Patient tolerated treatment well   Behavior During Therapy [facility] for tasks assessed/performed      Past Medical History  Diagnosis Date  . Embolic stroke involving right middle cerebral artery 08/08/2004    Occured after traveling from [place] to [place] where he was hospitalized for 1 month with no further work-up or therapy.  Returned to [place] unable to walk and was admitted to [facility] immediately after landing. Presumed embolic per neuro but TEE, bubble study, and hypercoag W/U negative. On indefinite coumadin per patient's informed preference.  Residual effects : Left spastic hemiplegia. Tx with phenol tibi  . Pulmonary embolism 09/30/2004    Diagnosed in April 2006 after returning from [place].  Likely provoked as it occurred after a 12 hour plane flight within 2 months of R MCA CVA with left hemiparesis. Patient has made an informed decision to remain on lifelong warfarin.   . Chronic low back pain 06/12/2013    L4-5 facet arthropathy   . Osteoarthritis of both knees 01/26/2012  . Chronic pain of both shoulders 12/07/2013    A/C joint osteoarthritis   . Obesity (BMI 30.0-34.9) 12/07/2013  . Chronic constipation 06/12/2013  . Positive PPD 12/07/2013  . Cluster headaches     Resolved in 2006  . Hyperplastic colon polyp 07/27/2012    Excised endoscopically 07/27/2012    Past Surgical History  Procedure Laterality Date  . Foot surgery Bilateral     Bunion  . I&d extremity Left  11/28/2001    Left thumb thenar space abscess.    There were no vitals filed for this visit.  Visit Diagnosis:  Gait instability  Lateral knee pain, left      Subjective Assessment - 02/06/15 0840    Subjective Pain ranges from 5-8/10 but it is different...it is slightly better. Knee hurts for maybe 6-7 steps and then is better now.   Pertinent History CVA 2006 resulting in L hemiparesis UE and LE.     Limitations Standing;Walking;House hold activities;Lifting   How long can you sit comfortably? as long as possible   How long can you stand comfortably? with full WB, not comfortable    How long can you walk comfortably? it varies.Marland KitchenMarland KitchenI walk through the pain; 10-15 min   Diagnostic tests XR in knees   Patient Stated Goals i just want this pain to go away   Currently in Pain? Yes   Pain Score 6    Pain Location Knee   Pain Orientation Left   Pain Descriptors / Indicators Aching   Pain Type Chronic pain   Pain Radiating Towards L mediolateral hamstring   Pain Onset More than a month ago   Pain Frequency Intermittent   Aggravating Factors  WB   Pain Relieving Factors resting   Effect of Pain on Daily Activities pain after transfer   Multiple Pain Sites No  [place] Adult PT Treatment/Exercise - 02/06/15 0001    Knee/Hip Exercises: Seated   Heel Slides AROM;Strengthening   Heel Slides Limitations good contraction of Hamstring seated   Other Seated Knee/Hip Exercises heel slide isometric x 20 with 5 sec hold   Knee/Hip Exercises: Supine   Other Supine Knee/Hip Exercises heel slides x 20, hamstring digs x 20 with 3-5 sec holds,,heel slides from knee on bolster x 20 with 3-5 sec holds; heel slides from on top of bolster with 2 towel rolls to facilitate increased hamstring firing from different angle x 20;    Knee/Hip Exercises: Sidelying   Other Sidelying Knee/Hip Exercises R sidelying, L knee flexion x 20;   Knee/Hip Exercises: Prone    Hamstring Curl 2 sets  20 reps with bolster under ankle                     PT Long Term Goals - 02/06/15 0844    PT LONG TERM GOAL #1   Title Pt will be I with HEP for LLE as of last appt   Time 6   Period Weeks   Status Achieved   PT LONG TERM GOAL #2   Title Pt will report more confidence in LLE due to less pain overall with gait   Time 6   Period Weeks   Status Partially Met   PT LONG TERM GOAL #3   Title Pt will be able to report less pain overall with gait and stairs by 25%    Time 6   Period Weeks   Status Partially Met   PT LONG TERM GOAL #4   Title Pt will report at least 75% improvement in L posterior knee pain with mobility   Time 6   Period Weeks   Status Revised               Plan - 02/06/15 1607    Clinical Impression Statement pt continues to demo weak L hamstring musculature but improved facilitation with varying degrees of knee flexion.   Pt will benefit from skilled therapeutic intervention in order to improve on the following deficits Abnormal gait;Decreased coordination;Decreased range of motion;Difficulty walking;Increased fascial restricitons;Impaired tone;Impaired UE functional use;Increased muscle spasms;Decreased activity tolerance;Pain;Improper body mechanics;Impaired flexibility;Hypomobility;Decreased balance;Decreased knowledge of use of DME;Decreased mobility;Decreased strength;Impaired sensation;Postural dysfunction   Rehab Potential Fair   Clinical Impairments Affecting Rehab Potential Patient will be limited in progress and HEP performance due to lack of function in LUE   PT Frequency 2x / week   PT Duration 6 weeks   PT Treatment/Interventions ADLs/Self Care Home Management;Functional mobility training;Patient/family education;Passive range of motion;Therapeutic activities;Moist Heat;Therapeutic exercise;Cryotherapy;Electrical Stimulation;DME Instruction;Gait training;Stair training;Manual techniques;Neuromuscular  re-education;Balance training   PT Next Visit Plan more seated hamstring stretching due to good hamstring facilitation in sitting; taping reported with limited success   PT Home Exercise Plan current HEP   Consulted and Agree with Plan of Care Patient        Problem List Patient Active Problem List   Diagnosis Date Noted  . Acute upper respiratory infection 08/12/2014  . Health care maintenance 07/28/2014  . Tinnitus 07/28/2014  . Chronic pain of both shoulders 12/07/2013  . Positive PPD 12/07/2013  . Obesity (BMI 30.0-34.9) 12/07/2013  . Chronic low back pain 06/12/2013  . Chronic constipation 06/12/2013  . Preventative health care 03/21/2012  . Osteoarthritis of both knees 01/26/2012  . Hyperlipidemia 11/16/2006  . Pulmonary embolism 09/30/2004  . Embolic stroke involving right middle cerebral  artery 08/08/2004    Myra Rude, PT 02/06/2015, 9:24 AM  [facility] [address] [place], [place], [address] Phone: [phone]   Fax:  [phone]     PHYSICAL THERAPY DISCHARGE SUMMARY  Visits from Start of Care: 5  Current functional level related to goals / functional outcomes: See above for most recent goals met.    Remaining deficits: Unknown   Education / Equipment: Gait, strength and ROM  Plan: Patient agrees to discharge.  Patient goals were partially met. Patient is being discharged due to not returning since the last visit.  ?????   Raeford Razor, PT 05/05/2015 9:38 AM Phone: [phone] Fax: [phone]

## 2015-02-09 ENCOUNTER — Ambulatory Visit (INDEPENDENT_AMBULATORY_CARE_PROVIDER_SITE_OTHER): Payer: Commercial Managed Care - HMO | Admitting: Pharmacist

## 2015-02-09 ENCOUNTER — Encounter: Payer: Medicare HMO | Admitting: Physical Therapy

## 2015-02-09 DIAGNOSIS — I2699 Other pulmonary embolism without acute cor pulmonale: Secondary | ICD-10-CM

## 2015-02-09 LAB — POCT INR: INR: 2.9

## 2015-02-09 NOTE — Patient Instructions (Signed)
Patient instructed to take medications as defined in the Anti-coagulation Track section of this encounter.  Patient instructed to take today's dose.  Patient verbalized understanding of these instructions.    

## 2015-02-09 NOTE — Progress Notes (Signed)
Anti-Coagulation Progress Note  Alexander English is a 59 y.o. male who is currently on an anti-coagulation regimen.    RECENT RESULTS: Recent results are below, the most recent result is correlated with a dose of 27.5 mg. per week: Lab Results  Component Value Date   INR 2.90 02/09/2015   INR 2.00 01/05/2015   INR 3.70 12/08/2014    ANTI-COAG DOSE: Anticoagulation Dose Instructions as of 02/09/2015      [name] Tue Wed Thu Fri Sat   New Dose 2.5 mg 5 mg 2.5 mg 5 mg 2.5 mg 5 mg 2.5 mg       ANTICOAG SUMMARY: Anticoagulation Episode Summary    Current INR goal 2.0-3.0  Next INR check 03/09/2015  INR from last check 2.90 (02/09/2015)  Weekly max dose   Target end date Indefinite  [facility] check location [facility]  Preferred lab   Send INR reminders to    Indications  Pulmonary embolism [I26.99]        Comments         ANTICOAG TODAY: Anticoagulation Summary as of 02/09/2015    INR goal 2.0-3.0  Selected INR 2.90 (02/09/2015)  Next INR check 03/09/2015  Target end date Indefinite   Indications  Pulmonary embolism [I26.99]      Anticoagulation Episode Summary    [facility]R check location [facility]   Preferred lab    Send INR reminders to    Comments       PATIENT INSTRUCTIONS: Patient Instructions  Patient instructed to take medications as defined in the Anti-coagulation Track section of this encounter.  Patient instructed to take today's dose.  Patient verbalized understanding of these instructions.       FOLLOW-UP Return in 4 weeks (on 03/09/2015) for Follow up INR at 0945h.  [name], III Pharm.D., CACP

## 2015-02-13 ENCOUNTER — Ambulatory Visit: Payer: Commercial Managed Care - HMO | Admitting: Physical Therapy

## 2015-02-16 ENCOUNTER — Ambulatory Visit: Payer: Commercial Managed Care - HMO | Admitting: Physical Therapy

## 2015-02-20 ENCOUNTER — Ambulatory Visit: Payer: Commercial Managed Care - HMO | Admitting: Physical Therapy

## 2015-02-23 ENCOUNTER — Ambulatory Visit: Payer: Commercial Managed Care - HMO | Admitting: Physical Therapy

## 2015-02-27 ENCOUNTER — Encounter: Payer: Medicare HMO | Admitting: Physical Therapy

## 2015-03-09 ENCOUNTER — Ambulatory Visit (INDEPENDENT_AMBULATORY_CARE_PROVIDER_SITE_OTHER): Payer: Commercial Managed Care - HMO | Admitting: Pharmacist

## 2015-03-09 ENCOUNTER — Ambulatory Visit: Payer: Commercial Managed Care - HMO | Admitting: *Deleted

## 2015-03-09 DIAGNOSIS — Z23 Encounter for immunization: Secondary | ICD-10-CM

## 2015-03-09 DIAGNOSIS — Z0000 Encounter for general adult medical examination without abnormal findings: Secondary | ICD-10-CM

## 2015-03-09 DIAGNOSIS — Z7901 Long term (current) use of anticoagulants: Secondary | ICD-10-CM | POA: Diagnosis not present

## 2015-03-09 DIAGNOSIS — I2699 Other pulmonary embolism without acute cor pulmonale: Secondary | ICD-10-CM

## 2015-03-09 LAB — POCT INR: INR: 2.1

## 2015-03-09 NOTE — Patient Instructions (Signed)
Patient instructed to take medications as defined in the Anti-coagulation Track section of this encounter.  Patient instructed to take today's dose.  Patient verbalized understanding of these instructions.    

## 2015-03-09 NOTE — Progress Notes (Signed)
Anti-Coagulation Progress Note  Alexander English is a 59 y.o. male who is currently on an anti-coagulation regimen.    RECENT RESULTS: Recent results are below, the most recent result is correlated with a dose of 25 mg. per week: Lab Results  Component Value Date   INR 2.10 03/09/2015   INR 2.90 02/09/2015   INR 2.00 01/05/2015    ANTI-COAG DOSE: Anticoagulation Dose Instructions as of 03/09/2015      [name] Tue Wed Thu Fri Sat   New Dose 2.5 mg 5 mg 5 mg 5 mg 2.5 mg 5 mg 2.5 mg       ANTICOAG SUMMARY: Anticoagulation Episode Summary    Current INR goal 2.0-3.0  Next INR check 04/06/2015  INR from last check 2.10 (03/09/2015)  Weekly max dose   Target end date Indefinite  [facility] check location [facility]  Preferred lab   Send INR reminders to    Indications  Pulmonary embolism [I26.99]        Comments         ANTICOAG TODAY: Anticoagulation Summary as of 03/09/2015    INR goal 2.0-3.0  Selected INR 2.10 (03/09/2015)  Next INR check 04/06/2015  Target end date Indefinite   Indications  Pulmonary embolism [I26.99]      Anticoagulation Episode Summary    IN[facility] check location [facility]   Preferred lab    Send INR reminders to    Comments       PATIENT INSTRUCTIONS: Patient Instructions  Patient instructed to take medications as defined in the Anti-coagulation Track section of this encounter.  Patient instructed to take today's dose.  Patient verbalized understanding of these instructions.       FOLLOW-UP Return in 4 weeks (on 04/06/2015) for Follow up INR at 0945h.  [name], III Pharm.D., CACP

## 2015-03-19 ENCOUNTER — Telehealth: Payer: Self-pay | Admitting: *Deleted

## 2015-03-19 ENCOUNTER — Encounter: Payer: Self-pay | Admitting: Internal Medicine

## 2015-03-19 ENCOUNTER — Ambulatory Visit (INDEPENDENT_AMBULATORY_CARE_PROVIDER_SITE_OTHER): Payer: Commercial Managed Care - HMO | Admitting: Internal Medicine

## 2015-03-19 VITALS — BP 114/72 | HR 76 | Temp 98.1°F | Ht 72.0 in | Wt 227.4 lb

## 2015-03-19 DIAGNOSIS — I69398 Other sequelae of cerebral infarction: Secondary | ICD-10-CM

## 2015-03-19 DIAGNOSIS — M21372 Foot drop, left foot: Secondary | ICD-10-CM

## 2015-03-19 DIAGNOSIS — L03032 Cellulitis of left toe: Secondary | ICD-10-CM

## 2015-03-19 DIAGNOSIS — I69354 Hemiplegia and hemiparesis following cerebral infarction affecting left non-dominant side: Secondary | ICD-10-CM

## 2015-03-19 DIAGNOSIS — M7989 Other specified soft tissue disorders: Secondary | ICD-10-CM

## 2015-03-19 DIAGNOSIS — B351 Tinea unguium: Secondary | ICD-10-CM

## 2015-03-19 MED ORDER — SULFAMETHOXAZOLE-TRIMETHOPRIM 800-160 MG PO TABS: 1.0000 | ORAL_TABLET | Freq: Two times a day (BID) | ORAL | Status: DC

## 2015-03-19 NOTE — Patient Instructions (Addendum)
Thanks for your visit today Please take your antibiotic for 5 days (twice daily) Please follow up with podiatry (foot doctor)- they will call you to schedule an appointment\ Paronychia Paronychia is an inflammatory reaction involving the folds of the skin surrounding the fingernail. This is commonly caused by an infection in the skin around a nail. The most common cause of paronychia is frequent wetting of the hands (as seen with bartenders, food servers, nurses or others who wet their hands). This makes the skin around the fingernail susceptible to infection by bacteria (germs) or fungus. Other predisposing factors are:  Aggressive manicuring.  Nail biting.  Thumb sucking. The most common cause is a staphylococcal (a type of germ) infection, or a fungal (Candida) infection. When caused by a germ, it usually comes on suddenly with redness, swelling, pus and is often painful. It may get under the nail and form an abscess (collection of pus), or form an abscess around the nail. If the nail itself is infected with a fungus, the treatment is usually prolonged and may require oral medicine for up to one year. Your caregiver will determine the length of time treatment is required. The paronychia caused by bacteria (germs) may largely be avoided by not pulling on hangnails or picking at cuticles. When the infection occurs at the tips of the finger it is called felon. When the cause of paronychia is from the herpes simplex virus (HSV) it is called herpetic whitlow. TREATMENT  When an abscess is present treatment is often incision and drainage. This means that the abscess must be cut open so the pus can get out. When this is done, the following home care instructions should be followed. HOME CARE INSTRUCTIONS   It is important to keep the affected fingers very dry. Rubber or plastic gloves over cotton gloves should be used whenever the hand must be placed in water.  Keep wound clean, dry and dressed as  suggested by your caregiver between warm soaks or warm compresses.  Soak in warm water for fifteen to twenty minutes three to four times per day for bacterial infections. Fungal infections are very difficult to treat, so often require treatment for long periods of time.  For bacterial (germ) infections take antibiotics (medicine which kill germs) as directed and finish the prescription, even if the problem appears to be solved before the medicine is gone.  Only take over-the-counter or prescription medicines for pain, discomfort, or fever as directed by your caregiver. SEEK IMMEDIATE MEDICAL CARE IF:  You have redness, swelling, or increasing pain in the wound.  You notice pus coming from the wound.  You have a fever.  You notice a bad smell coming from the wound or dressing. Document Released: 12/07/2000 Document Revised: 09/05/2011 Document Reviewed: 08/08/2008 [facility] Patient Information 2015 [place], [place]. This information is not intended to replace advice given to you by your health care provider. Make sure you discuss any questions you have with your health care provider.

## 2015-03-19 NOTE — Assessment & Plan Note (Signed)
Pt came in with 3-4 day duration of swelling and tenderness of the left toe. He said his friend was clipping the toenail when they noticed yellow pus coming out of it last night. Pt did not notice the swelling but said it may have been present for a week.  Pt denies any constitutional symptoms including fevers at home/ On exam, left great tow tender to palpation with some fluctuance noted, no drainage, but has a pinpoint hole where the pus may have come out.  Toe nail is black and hypertrophied and no ingrown nail present. Pulses intact, no calf tenderness.  Left foot & leg is slightly swollen than right, but per patient it has been chronically swollen since the stroke happened/  A/P likely acute paronychia Given Bactrim DS BID for 5 days -referred to Podiatry for toe nail management -advised proper foot care -RTC if symptoms worsen

## 2015-03-19 NOTE — Telephone Encounter (Signed)
Pt presents c/o L great toe being painful for about 2-3 days, friend clipped toenails and "pus shot out of toe" he would like to be seen, spoke w/ gladys and he will take the 0915 slot that was a no show on dr saraiya's schedule

## 2015-03-19 NOTE — Telephone Encounter (Signed)
Agree, thanks

## 2015-03-19 NOTE — Progress Notes (Signed)
Patient ID: Alexander English, male   DOB: 1955/11/03, 59 y.o.   MRN: [id number]    Subjective:   [name]   DOB: 08-11-1955 59 y.o.   MRN: [id number]  HPI: [name] is a 59 y.o. man with history of stroke with residual left sided leg weakness here for left sided toe nail infection and swelling of 3-4 day duration Other PMH noted below   Past Medical History  Diagnosis Date  . Embolic stroke involving right middle cerebral artery 08/08/2004    Occured after traveling from [place] to [place] where he was hospitalized for 1 month with no further work-up or therapy.  Returned to [place] unable to walk and was admitted to [facility] immediately after landing. Presumed embolic per neuro but TEE, bubble study, and hypercoag W/U negative. On indefinite coumadin per patient's informed preference.  Residual effects : Left spastic hemiplegia. Tx with phenol tibi  . Pulmonary embolism 09/30/2004    Diagnosed in April 2006 after returning from [place].  Likely provoked as it occurred after a 12 hour plane flight within 2 months of R MCA CVA with left hemiparesis. Patient has made an informed decision to remain on lifelong warfarin.   . Chronic low back pain 06/12/2013    L4-5 facet arthropathy   . Osteoarthritis of both knees 01/26/2012  . Chronic pain of both shoulders 12/07/2013    A/C joint osteoarthritis   . Obesity (BMI 30.0-34.9) 12/07/2013  . Chronic constipation 06/12/2013  . Positive PPD 12/07/2013  . Cluster headaches     Resolved in 2006  . Hyperplastic colon polyp 07/27/2012    Excised endoscopically 07/27/2012   Current Outpatient Prescriptions  Medication Sig Dispense Refill  . aspirin EC 81 MG tablet Take 81 mg by mouth daily.    Marland Kitchen atorvastatin (LIPITOR) 10 MG tablet Take 1 tablet (10 mg total) by mouth daily. Void other Rx for atorvastatin. Patient requesting 90 days supply. 90 tablet 3  . diclofenac sodium (VOLTAREN) 1 % GEL Apply topically 4 (four) times daily.     Marland Kitchen GARLIC PO Take 1 tablet by mouth daily.    Marland Kitchen guaiFENesin (MUCINEX) 600 MG 12 hr tablet Take 1 tablet (600 mg total) by mouth 2 (two) times daily. 60 tablet 2  . meloxicam (MOBIC) 7.5 MG tablet Take 1 tablet (7.5 mg total) by mouth daily as needed for pain. 90 tablet 3  . Multiple Vitamin (MULTIVITAMIN) tablet Take 1 tablet by mouth daily.    . sodium chloride (OCEAN) 0.65 % SOLN nasal spray Place 1 spray into both nostrils as needed for congestion. 30 mL 2  . sulfamethoxazole-trimethoprim (BACTRIM DS,SEPTRA DS) 800-160 MG per tablet Take 1 tablet by mouth 2 (two) times daily. 10 tablet 0  . warfarin (COUMADIN) 5 MG tablet TAKE ONE TABLET BY MOUTH ONCE DAILY EXCEPT TAKE 0.5 TABLETS BY MOUTH ON MONDAYS AND THURSDAYS. Dispense 90 day supply. 80 tablet 3   No current facility-administered medications for this visit.   Family History  Problem Relation Age of Onset  . Stroke Maternal Grandmother   . Unexplained death Mother   . Depression Mother     After husband's death  . Unexplained death Father   . Osteoarthritis Father     Knees  . Early death Sister   . Seizures Sister   . Early death Brother 39    Unknown cause  . Healthy Daughter   . Healthy Son   . Stroke Maternal Aunt   .  Healthy Sister   . Healthy Sister   . Unexplained death Brother   . Healthy Brother   . Healthy Son    Social History   Social History  . Marital Status: Divorced    Spouse Name: N/A  . Number of Children: N/A  . Years of Education: N/A   Social History Main Topics  . Smoking status: Former Smoker    Quit date: 06/28/2007  . Smokeless tobacco: Never Used  . Alcohol Use: No     Comment: Remote past  . Drug Use: No  . Sexual Activity: Yes    Birth Control/ Protection: None   Other Topics Concern  . None   Social History Narrative   Born in [place] but has been in the [place] for > 30 years.  Divorced, 1 daughter and 2 sons.  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   Review of Systems: Review of Systems  Constitutional: Negative for fever, chills and malaise/fatigue.  Respiratory: Negative.   Cardiovascular: Negative.   Gastrointestinal: Negative.   Neurological:       Leg weakness after stroke    Objective:  Physical Exam: Filed Vitals:   03/19/15 0955  BP: 114/72  Pulse: 76  Temp: 98.1 F (36.7 C)  TempSrc: Oral  Height: 6' (1.829 m)  Weight: 227 lb 6.4 oz (103.148 kg)  SpO2: 100%   Physical Exam  Constitutional: He appears well-developed and well-nourished.  HENT:  Head: Normocephalic and atraumatic.  Eyes: EOM are normal.  Neck: Normal range of motion. Neck supple.  Cardiovascular: Normal rate, regular rhythm, normal heart sounds and intact distal pulses.   Pulmonary/Chest: Effort normal and breath sounds normal. No respiratory distress. He has no wheezes.  Skin: Skin is warm.  Extremities: left great tow has some fluctuance and redness around it and a pinpoint opening where the pus may have extruded, Currently, no purulent drainage,  Also, onychomycosis of the toenail present   Assessment & Plan:   Please see problem based charting for assessment and plan

## 2015-03-19 NOTE — Progress Notes (Signed)
59 year old man with history of srtoke with left sided residual weakness, left foot drop comes in with history of 3-4 day swelling of left toe around the lateral aspect of nail, which he did not notice until pus came out of it yesterday. It was tender earlier but its better now. No systemic symptoms of infection.  Exam - Left big toe appears to have some minimal swelling on the lateral side of nail with some minimal fluctuance. A small opening at the top of the nailbed could have been where purulent material might have exited per patient report, however I do not see any purulence now. Mildly tender to palpation. Nail is blackened. Chronic hypertrophy. No ingrowth of nail visible. Likely acute paronychia which appears to be getting better.   - Bactrim DS 100 mg twice daily for 5 days. RTC if not better. - Refer to podiatry for onychomycosis and preventive foot care given stroke history with residual deficits. [name] MD MPH 03/19/2015 11:29 AM

## 2015-03-19 NOTE — Assessment & Plan Note (Signed)
Pt's toe was black and hypertrophied, likely due to onychomycosis and poor foot care.  -Advised preventive foot care -Referred to podiatry  -Bactrim DS for acute paronychia

## 2015-03-26 ENCOUNTER — Ambulatory Visit: Payer: Commercial Managed Care - HMO | Admitting: Podiatry

## 2015-03-27 ENCOUNTER — Ambulatory Visit (INDEPENDENT_AMBULATORY_CARE_PROVIDER_SITE_OTHER): Payer: Commercial Managed Care - HMO | Admitting: Internal Medicine

## 2015-03-27 ENCOUNTER — Encounter: Payer: Self-pay | Admitting: Internal Medicine

## 2015-03-27 VITALS — BP 123/82 | HR 75 | Temp 98.0°F | Wt 226.8 lb

## 2015-03-27 DIAGNOSIS — M170 Bilateral primary osteoarthritis of knee: Secondary | ICD-10-CM

## 2015-03-27 DIAGNOSIS — B351 Tinea unguium: Secondary | ICD-10-CM | POA: Diagnosis not present

## 2015-03-27 DIAGNOSIS — E785 Hyperlipidemia, unspecified: Secondary | ICD-10-CM

## 2015-03-27 DIAGNOSIS — Z23 Encounter for immunization: Secondary | ICD-10-CM

## 2015-03-27 DIAGNOSIS — Z0000 Encounter for general adult medical examination without abnormal findings: Secondary | ICD-10-CM

## 2015-03-27 DIAGNOSIS — Z79899 Other long term (current) drug therapy: Secondary | ICD-10-CM | POA: Diagnosis not present

## 2015-03-27 MED ORDER — TRAMADOL HCL 50 MG PO TABS: 50.0000 mg | ORAL_TABLET | Freq: Four times a day (QID) | ORAL | Status: DC | PRN

## 2015-03-27 NOTE — Patient Instructions (Signed)
It was good to see you again.  I am sorry your left knee is still giving you trouble.  1) Keep taking the medications as you are.  2) I added tramadol 50 mg by mouth every 6 hours as needed for left knee pain.  Disp #60.  3) We gave you the tetanus shot today.  4) We drew blood to check for hepatitis C as everyone our age should get screened once.  I will call you next week when I get the result if there are any concerns.  5) I will send you to [facility], [facility], or [facility] to see if they can make a brace for your left knee that would be acceptable to you.  I will see you back in 1-3 months to see how things are going.

## 2015-03-28 LAB — HCV COMMENT:

## 2015-03-28 LAB — HEPATITIS C ANTIBODY (REFLEX): HCV Ab: <0.1 s/co ratio (ref 0.0–0.9)

## 2015-03-28 NOTE — Assessment & Plan Note (Signed)
He continues to tolerate the atorvastatin 10 mg daily without difficulty, specifically denying myalgias.  We will continue with this therapy.

## 2015-03-28 NOTE — Assessment & Plan Note (Signed)
He had already received the flu vaccination this year.  He was due for the Tdap and this was provided.  He also was screened for hepatitis C and this was negative.  We will discuss the zostavax at the follow-up appointment.

## 2015-03-28 NOTE — Progress Notes (Signed)
   Subjective:    Patient ID: Alexander English, male    DOB: [birth year], 59 y.o.   MRN: [id number]  HPI  Alexander English is here for reevaluation of his left knee pain and preventative health care. Please see the A&P for the status of the pt's chronic medical problems.  Review of Systems  Constitutional: Negative for activity change.  Cardiovascular: Negative for leg swelling.  Musculoskeletal: Positive for arthralgias and gait problem. Negative for myalgias and joint swelling.       Left knee pain for the first 3-4 steps after getting up to ambulate.  He does not have pain at rest and the pain improves after the first 3-4 steps.  There has been no redness, swelling, warmth, or trauma to the left knee area (anterior and posterior).  Skin: Negative for rash.  Neurological: Negative for syncope, weakness, light-headedness and numbness.      Objective:   Physical Exam  Constitutional: He is oriented to person, place, and time. He appears well-developed and well-nourished. No distress.  HENT:  Head: Normocephalic and atraumatic.  Musculoskeletal: Normal range of motion. He exhibits tenderness. He exhibits no edema.       Left lower leg: He exhibits tenderness. He exhibits no bony tenderness, no swelling, no edema, no deformity and no laceration.       Legs: Upon standing notable hyperextension was noted at the left knee.  Neurological: He is alert and oriented to person, place, and time. He exhibits normal muscle tone.  Skin: Skin is warm and dry. No rash noted. He is not diaphoretic. No erythema.  Psychiatric: He has a normal mood and affect. His behavior is normal. Judgment and thought content normal.  Nursing note and vitals reviewed.     Assessment & Plan:   Please see problem oriented charting.

## 2015-03-28 NOTE — Assessment & Plan Note (Signed)
His toe paronychia has resolved with antibiotic therapy and he no longer is having pain in that area.  He is scheduled to see the Podiatrist to address his onychomycosis in the very near future.  We will reassess this issue at the return appointment.

## 2015-03-28 NOTE — Assessment & Plan Note (Signed)
The posterior aspect of his left knee has progressively become more painful lately.  There is no pain at rest, but the first 3-4 steps during ambulation are very painful and described as a sharp pain in this area.  After the first 3-4 steps the pain improves.  He has had not recent falls, trauma, fevers, shakes, or chills.  The meloxicam is somewhat helpful for the chronic DJD pain but is ineffective for the initial pain with ambulation.  He was seen for 4 sessions by physical therapy and he felt there were no improvements in his symptoms.  He was also seen by orthopedic surgery (notes unavailable for my review) and he states they felt there was not much that could be done, but surgery was offered.  He is currently not interested in either surgery or narcotic therapy.  He was also referred to a local prosthetist and a very bulky brace that extended up to the left buttock region was recommended.  This was not feasible for the patient as it was bulky and made ambulation more difficult because of the weight given his underlying post CVA left sided weakness.  He also felt that using the bathroom would have been very difficult given the limited use of his left hand and the difficulty getting the large and heavy brace on and off.  We will explore other prosthetists to see if a less bulky and heavy brace can be fabricated for him.  He will continue the meloxicam, Voltaren gel, and was willing to give a therapeutic trial of tramadol 50 mg PO Q6H PRN a try.  We will reassess his pain at the follow-up visit and see if a brace for his left knee hyperextension was possible and something he could work with.

## 2015-04-01 ENCOUNTER — Encounter: Payer: Self-pay | Admitting: Podiatry

## 2015-04-01 ENCOUNTER — Ambulatory Visit (INDEPENDENT_AMBULATORY_CARE_PROVIDER_SITE_OTHER): Payer: Commercial Managed Care - HMO | Admitting: Podiatry

## 2015-04-01 DIAGNOSIS — M79676 Pain in unspecified toe(s): Secondary | ICD-10-CM | POA: Diagnosis not present

## 2015-04-01 DIAGNOSIS — B351 Tinea unguium: Secondary | ICD-10-CM | POA: Diagnosis not present

## 2015-04-01 DIAGNOSIS — M79675 Pain in left toe(s): Secondary | ICD-10-CM

## 2015-04-01 NOTE — Progress Notes (Signed)
Subjective:     Patient ID: Alexander English, male   DOB: [birth year], 59 y.o.   MRN: [id number]  HPIThis patient presents to the office saying he has dealt with painful big toenail left foot.  He says the nail is thick and deformed and growing into the corners of his nails.  He says there was pus two weeks ago and was treated with antibiotics by his doctor. He is still concerned about his nail and he presents nfor evaluation and treatment.    Review of Systems     Objective:   Physical Exam GENERAL APPEARANCE: Alert, conversant. Appropriately groomed. No acute distress.  VASCULAR: Pedal pulses palpable at  [facility] and PT bilateral.  Capillary refill time is immediate to all digits,  Normal temperature gradient.  Digital hair growth is present bilateral  NEUROLOGIC: sensation is normal to 5.07 monofilament at 5/5 sites bilateral.  Light touch is intact bilateral, Muscle strength normal.  MUSCULOSKELETAL: acceptable muscle strength, tone and stability bilateral.  Intrinsic muscluature intact bilateral.  Rectus appearance of foot and digits noted bilateral.   DERMATOLOGIC: skin color, texture, and turgor are within normal limits.  No preulcerative lesions or ulcers  are seen, no interdigital maceration noted.  No open lesions present.  Digital nails are asymptomatic. No drainage noted.His left hallux nail is thick disfigured with incurvation noted both borders.  There is desquamation at proximal nail fold.      Assessment:     Onychomycosis     Plan:     IE  Debride left hallux.  RTC 3 months.

## 2015-04-03 ENCOUNTER — Telehealth: Payer: Self-pay | Admitting: *Deleted

## 2015-04-03 DIAGNOSIS — I63411 Cerebral infarction due to embolism of right middle cerebral artery: Secondary | ICD-10-CM

## 2015-04-03 NOTE — Telephone Encounter (Signed)
Received return phone call from [facility]: [place] located at [address] [place], [place] [address] Phone: [phone] Fax: [phone] Hours: Monday - Friday, 8 A.M. - 5 P.M.   Pt seen last week by pcp and inquired about getting a new "brace" for lower ext.  Order for [facility] to [facility]

## 2015-04-06 ENCOUNTER — Ambulatory Visit (INDEPENDENT_AMBULATORY_CARE_PROVIDER_SITE_OTHER): Payer: Commercial Managed Care - HMO | Admitting: Pharmacist

## 2015-04-06 DIAGNOSIS — Z7901 Long term (current) use of anticoagulants: Secondary | ICD-10-CM | POA: Diagnosis not present

## 2015-04-06 DIAGNOSIS — I2699 Other pulmonary embolism without acute cor pulmonale: Secondary | ICD-10-CM

## 2015-04-06 LAB — POCT INR: INR: 1.8

## 2015-04-06 NOTE — Progress Notes (Signed)
Anticoagulation Management FADY STAMPS is a 59 y.o. male who reports to the clinic for monitoring of warfarin treatment.    Indication: PE Duration: indefinite  Anticoagulation Clinic Visit History: Anticoagulation Episode Summary    Current INR goal 2.0-3.0  Next INR check 04/27/2015  INR from last check 1.8! (04/06/2015)  Weekly max dose   Target end date Indefinite  [facility]  Preferred lab   Send INR reminders to    Indications  Pulmonary embolism ([place]) [I26.99]        Comments        ASSESSMENT Recent Results: Recent results are below, the most recent result is correlated with a dose of 27.5 mg per week: Lab Results  Component Value Date   INR 1.8 04/06/2015   INR 2.10 03/09/2015   INR 2.90 02/09/2015  pt reports no s/sx of bleeding, diet changes, medication changes.   INR today: Subtherapeutic  Anticoagulation Dosing: INR as of 04/06/2015 and Previous Dosing Information    INR Dt INR Goal Wkly Tot Sun Mon Tue Wed Thu Fri Sat   04/06/2015 1.8 2.0-3.0 27.5 mg 2.5 mg 5 mg 5 mg 5 mg 2.5 mg 5 mg 2.5 mg    Anticoagulation Dose Instructions as of 04/06/2015      Total Sun Mon Tue Wed Thu Fri Sat   New Dose 30 mg 2.5 mg 5 mg 5 mg 5 mg 2.5 mg 5 mg 5 mg     (5 mg x 0.5)  (5 mg x 1)  (5 mg x 1)  (5 mg x 1)  (5 mg x 0.5)  (5 mg x 1)  (5 mg x 1)                           PLAN Weekly dose was increased by 10% to 30 mg per week  Patient Instructions  Patient instructed to take medications as defined in the Anti-coagulation Track section of this encounter.  Patient instructed to take today's dose.  Patient verbalized understanding of these instructions.     Follow-up Return in about 3 weeks (around 04/27/2015) for f/u INR 04/27/15 10 am .  [name] E Combs  *

## 2015-04-06 NOTE — Progress Notes (Addendum)
Indication: Venous thromboembolism. Duration: Lifelong per patient preference. INR: Below target.  Dr. Lula Olszewski assessment and plan were reviewed and I agree with his documentation.

## 2015-04-06 NOTE — Patient Instructions (Signed)
Patient instructed to take medications as defined in the Anti-coagulation Track section of this encounter.  Patient instructed to take today's dose.  Patient verbalized understanding of these instructions.    

## 2015-04-08 ENCOUNTER — Ambulatory Visit (INDEPENDENT_AMBULATORY_CARE_PROVIDER_SITE_OTHER): Payer: Commercial Managed Care - HMO | Admitting: Internal Medicine

## 2015-04-08 ENCOUNTER — Encounter: Payer: Self-pay | Admitting: Internal Medicine

## 2015-04-08 VITALS — BP 108/71 | HR 76 | Temp 98.1°F | Ht 72.0 in

## 2015-04-08 DIAGNOSIS — J019 Acute sinusitis, unspecified: Secondary | ICD-10-CM

## 2015-04-08 DIAGNOSIS — R51 Headache: Secondary | ICD-10-CM

## 2015-04-08 DIAGNOSIS — R0981 Nasal congestion: Secondary | ICD-10-CM | POA: Diagnosis not present

## 2015-04-08 DIAGNOSIS — B9689 Other specified bacterial agents as the cause of diseases classified elsewhere: Secondary | ICD-10-CM | POA: Insufficient documentation

## 2015-04-08 DIAGNOSIS — Z7901 Long term (current) use of anticoagulants: Secondary | ICD-10-CM

## 2015-04-08 DIAGNOSIS — J0110 Acute frontal sinusitis, unspecified: Secondary | ICD-10-CM

## 2015-04-08 DIAGNOSIS — I2699 Other pulmonary embolism without acute cor pulmonale: Secondary | ICD-10-CM | POA: Diagnosis not present

## 2015-04-08 MED ORDER — AMOXICILLIN-POT CLAVULANATE 875-125 MG PO TABS: 1.0000 | ORAL_TABLET | Freq: Two times a day (BID) | ORAL | Status: AC

## 2015-04-08 NOTE — Patient Instructions (Signed)
Please take Augmentin twice daily for the next week. If no better, please come back for an appointment.  We will call you about your coumadin dosage.

## 2015-04-08 NOTE — Progress Notes (Signed)
   Subjective:    Patient ID: Alexander English, male    DOB: Jul 18, 1955, 59 y.o.   MRN: [id number]  HPI Alexander English is a 59 year old male with obesity, chronic anticoagulation for PE, hyperlipidemia, h/o embolic stroke of right MCA who presents today for evaluation of congestion. Please see assessment & plan for documentation of chronic medical problems.  Review of Systems  Constitutional: Positive for fever.  HENT: Positive for congestion. Negative for ear pain, rhinorrhea and sore throat.   Eyes: Negative for pain.  Respiratory: Negative for cough.   Gastrointestinal: Negative for nausea, vomiting and diarrhea.  Allergic/Immunologic: Negative for environmental allergies.  Neurological: Positive for headaches.       Objective:   Physical Exam  Constitutional: Elderly [place] male. No distress.  Head: Normocephalic and atraumatic. Tenderness to palpation overlying frontal sinus areas. Eyes: Conjunctivae are normal. Pupils are 3 mm, direct, consensual, near.  Ears: TM visualized bilaterally. Cerumen present bilaterally. Cardiovascular: Normal rate, regular rhythm and normal heart sounds.  No gallop, friction rub, murmur heard. Pulmonary/Chest: Effort normal. No respiratory distress. No wheezes, rales.  Abdominal: Soft. Bowel sounds are normal. No distension. No tenderness.  Neurological: Alert and oriented to person, place, and time. Coordination normal. Abnormal gait. Skin: Warm and dry. Not diaphoretic.  Psychiatric: Affect mobile and congruent with thought content         Assessment & Plan:

## 2015-04-08 NOTE — Assessment & Plan Note (Signed)
Reports nasal congestion, headache, and facial pressure/pain for the last 2 weeks. Initially associated with cough and sore throat though since resolved. Nasal drainage is yellow and has been evolving from thick to thin. Denies any watery eyes, seasonal allergies, eye pain, sneezing though feels he may be having intermittent fever/chills. 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 No improvement with saline spray or guaifenisen.  Suspect he may have acute bacterial sinusitis given purulent nasal discharge and duration of symptoms with little improvement on symptomatic relief alone. Prescribed Augmentin 875/125mg  twice daily x 7 days and provided excuse note.

## 2015-04-08 NOTE — Assessment & Plan Note (Signed)
Warfarin dose increased to 30mg  today but explained to patient he may need another adjustment given use of antibiotic. Spoke with Dr. Maudie Mercury who will see if he needs closer monitoring.

## 2015-04-09 ENCOUNTER — Ambulatory Visit: Payer: Medicare HMO | Admitting: Internal Medicine

## 2015-04-09 NOTE — Progress Notes (Signed)
Internal Medicine Clinic Attending  Case discussed with Dr. Patel,Rushil at the time of the visit.  We reviewed the resident's history and exam and pertinent patient test results.  I agree with the assessment, diagnosis, and plan of care documented in the resident's note.  

## 2015-04-13 ENCOUNTER — Ambulatory Visit (INDEPENDENT_AMBULATORY_CARE_PROVIDER_SITE_OTHER): Payer: Commercial Managed Care - HMO | Admitting: Pharmacist

## 2015-04-13 DIAGNOSIS — Z86711 Personal history of pulmonary embolism: Secondary | ICD-10-CM

## 2015-04-13 DIAGNOSIS — I2699 Other pulmonary embolism without acute cor pulmonale: Secondary | ICD-10-CM | POA: Diagnosis not present

## 2015-04-13 DIAGNOSIS — Z7901 Long term (current) use of anticoagulants: Secondary | ICD-10-CM

## 2015-04-13 LAB — POCT INR: INR: 2

## 2015-04-13 MED ORDER — WARFARIN SODIUM 5 MG PO TABS: ORAL_TABLET | ORAL | Status: DC

## 2015-04-13 NOTE — Progress Notes (Signed)
I agree with the assessment and plan of care documented by Dr. Combs.  

## 2015-04-13 NOTE — Progress Notes (Signed)
Anticoagulation Management Alexander English is a 59 y.o. male who reports to the clinic for monitoring of warfarin treatment.    Indication: PE Duration: indefinite  Anticoagulation Clinic Visit History: Anticoagulation Episode Summary    Current INR goal 2.0-3.0  Next INR check 05/11/2015  INR from last check 2.00 (04/13/2015)  Weekly max dose   Target end date Indefinite  [facility]  Preferred lab   Send INR reminders to    Indications  Pulmonary embolism ([place]) [I26.99]        Comments        ASSESSMENT Recent Results: Recent results are below, the most recent result is correlated with a dose of 30 mg per week: Lab Results  Component Value Date   INR 2.00 04/13/2015   INR 1.8 04/06/2015   INR 2.10 03/09/2015  Pt is currently taking augmentin PO.  Pt states that has 2-3 days left of abx.  Pt denies s/sx of bleeding, diet changes, other medication changes.   INR today: Therapeutic  Anticoagulation Dosing: INR as of 04/13/2015 and Previous Dosing Information    INR Dt INR Goal [employer] Sun Mon Tue Wed Thu Fri Sat   04/13/2015 2.00 2.0-3.0 30 mg 2.5 mg 5 mg 5 mg 5 mg 2.5 mg 5 mg 5 mg    Anticoagulation Dose Instructions as of 04/13/2015      Total Sun Mon Tue Wed Thu Fri Sat   New Dose 32.5 mg 2.5 mg 5 mg 5 mg 5 mg 5 mg 5 mg 5 mg     (5 mg x 0.5)  (5 mg x 1)  (5 mg x 1)  (5 mg x 1)  (5 mg x 1)  (5 mg x 1)  (5 mg x 1)                           PLAN Will increase weekly dose by 2.5 mg since pt borderline therapeutic and is currently on augmentin.  Would expect INR to decrease once stops abx.  Will f/u INR in 4 weeks.  Patient Instructions  Patient instructed to take medications as defined in the Anti-coagulation Track section of this encounter.  Patient instructed to take today's dose.  Patient verbalized understanding of these instructions.     Follow-up Return in about 4 weeks (around 05/11/2015) for f/u INR 11/14 at 10  am .  Alexander English  20 minutes spent face-to-face with the patient during the encounter. 50% of time spent on education. 50% of time was spent on assessment and plan.

## 2015-04-13 NOTE — Patient Instructions (Signed)
Patient instructed to take medications as defined in the Anti-coagulation Track section of this encounter.  Patient instructed to take today's dose.  Patient verbalized understanding of these instructions.    

## 2015-04-13 NOTE — Addendum Note (Signed)
Addended by: Forde Dandy on: 04/13/2015 05:46 PM   Modules accepted: Orders

## 2015-04-21 NOTE — Progress Notes (Signed)
Indication: Thromboembolism. Duration: Lifelong per patient preference. INR at target. I agree with Dr. Lula Olszewski assessment and plan as documented.

## 2015-04-27 ENCOUNTER — Ambulatory Visit: Payer: Medicare HMO

## 2015-05-11 ENCOUNTER — Ambulatory Visit (INDEPENDENT_AMBULATORY_CARE_PROVIDER_SITE_OTHER): Payer: Commercial Managed Care - HMO | Admitting: Pharmacist

## 2015-05-11 DIAGNOSIS — Z7901 Long term (current) use of anticoagulants: Secondary | ICD-10-CM

## 2015-05-11 DIAGNOSIS — I2699 Other pulmonary embolism without acute cor pulmonale: Secondary | ICD-10-CM | POA: Diagnosis not present

## 2015-05-11 LAB — POCT INR: INR: 2.7

## 2015-05-11 NOTE — Progress Notes (Signed)
Anti-Coagulation Progress Note  Alexander English is a 60 y.o. male who is currently on an anti-coagulation regimen.    RECENT RESULTS: Recent results are below, the most recent result is correlated with a dose of 32.5 mg. per week: Lab Results  Component Value Date   INR 2.70 05/11/2015   INR 2.00 04/13/2015   INR 1.8 04/06/2015    ANTI-COAG DOSE: Anticoagulation Dose Instructions as of 05/11/2015      [name] Tue Wed Thu Fri Sat   New Dose 2.5 mg 5 mg 5 mg 5 mg 5 mg 5 mg 5 mg       ANTICOAG SUMMARY: Anticoagulation Episode Summary    Current INR goal 2.0-3.0  Next INR check 06/08/2015  INR from last check 2.70 (05/11/2015)  Weekly max dose   Target end date Indefinite  [facility]  Preferred lab   Send INR reminders to    Indications  Pulmonary embolism ([place]) [I26.99]        Comments         ANTICOAG TODAY: Anticoagulation Summary as of 05/11/2015    INR goal 2.0-3.0  Selected INR 2.70 (05/11/2015)  Next INR check 06/08/2015  Target end date Indefinite   Indications  Pulmonary embolism ([place]) [I26.99]      Anticoagulation Episode Summary    [facility]   Preferred lab    Send INR reminders to    Comments       PATIENT INSTRUCTIONS: Patient Instructions  Patient instructed to take medications as defined in the Anti-coagulation Track section of this encounter.  Patient instructed to take today's dose.  Patient verbalized understanding of these instructions.       FOLLOW-UP Return in 4 weeks (on 06/08/2015) for Follow up INR at 0945h.  [name], III Pharm.D., CACP

## 2015-05-11 NOTE — Patient Instructions (Signed)
Patient instructed to take medications as defined in the Anti-coagulation Track section of this encounter.  Patient instructed to take today's dose.  Patient verbalized understanding of these instructions.    

## 2015-06-08 ENCOUNTER — Ambulatory Visit (INDEPENDENT_AMBULATORY_CARE_PROVIDER_SITE_OTHER): Payer: Commercial Managed Care - HMO | Admitting: Pharmacist

## 2015-06-08 DIAGNOSIS — I2699 Other pulmonary embolism without acute cor pulmonale: Secondary | ICD-10-CM

## 2015-06-08 LAB — POCT INR: INR: 2.2

## 2015-06-08 NOTE — Patient Instructions (Signed)
Patient instructed to take medications as defined in the Anti-coagulation Track section of this encounter.  Patient instructed to take today's dose.  Patient verbalized understanding of these instructions.    

## 2015-06-08 NOTE — Progress Notes (Signed)
Anti-Coagulation Progress Note  Alexander English is a 59 y.o. male who is currently on an anti-coagulation regimen.    RECENT RESULTS: Recent results are below, the most recent result is correlated with a dose of 32.5 mg. per week: Lab Results  Component Value Date   INR 2.20 06/08/2015   INR 2.70 05/11/2015   INR 2.00 04/13/2015    ANTI-COAG DOSE: Anticoagulation Dose Instructions as of 06/08/2015      [name] Tue Wed Thu Fri Sat   New Dose 5 mg 5 mg 5 mg 5 mg 5 mg 5 mg 5 mg       ANTICOAG SUMMARY: Anticoagulation Episode Summary    Current INR goal 2.0-3.0  Next INR check 07/06/2015  INR from last check 2.20 (06/08/2015)  Weekly max dose   Target end date Indefinite  [facility]  Preferred lab   Send INR reminders to    Indications  Pulmonary embolism ([place]) [I26.99]        Comments         ANTICOAG TODAY: Anticoagulation Summary as of 06/08/2015    INR goal 2.0-3.0  Selected INR 2.20 (06/08/2015)  Next INR check 07/06/2015  Target end date Indefinite   Indications  Pulmonary embolism ([place]) [I26.99]      Anticoagulation Episode Summary    [facility]   Preferred lab    Send INR reminders to    Comments       PATIENT INSTRUCTIONS: Patient Instructions  Patient instructed to take medications as defined in the Anti-coagulation Track section of this encounter.  Patient instructed to take today's dose.  Patient verbalized understanding of these instructions.       FOLLOW-UP Return in 4 weeks (on 07/06/2015) for Follow up INR at 0915h.  [name], III Pharm.D., CACP

## 2015-06-11 NOTE — Progress Notes (Signed)
I have reviewed Dr. Gladstone Pih note.  Patient is on [facility] for PE.  INR is low normal, coumadin increased.

## 2015-06-24 ENCOUNTER — Encounter: Payer: Self-pay | Admitting: Internal Medicine

## 2015-06-24 ENCOUNTER — Ambulatory Visit (INDEPENDENT_AMBULATORY_CARE_PROVIDER_SITE_OTHER): Payer: Commercial Managed Care - HMO | Admitting: Internal Medicine

## 2015-06-24 ENCOUNTER — Ambulatory Visit: Payer: Commercial Managed Care - HMO | Admitting: Podiatry

## 2015-06-24 VITALS — BP 125/68 | HR 82 | Temp 98.5°F | Ht 72.0 in | Wt 229.1 lb

## 2015-06-24 DIAGNOSIS — R319 Hematuria, unspecified: Secondary | ICD-10-CM | POA: Diagnosis not present

## 2015-06-24 DIAGNOSIS — R310 Gross hematuria: Secondary | ICD-10-CM | POA: Insufficient documentation

## 2015-06-24 LAB — POCT URINALYSIS DIPSTICK
Bilirubin, UA: NEGATIVE
Glucose, UA: NEGATIVE
Ketones, UA: NEGATIVE
Leukocytes, UA: NEGATIVE
Nitrite, UA: NEGATIVE
Protein, UA: NEGATIVE
Spec Grav, UA: 1.02
Urobilinogen, UA: 0.2
pH, UA: 5

## 2015-06-24 LAB — POCT INR: INR: 3.7

## 2015-06-24 NOTE — Progress Notes (Signed)
Subjective:    Patient ID: Alexander English, male    DOB: [birth year], 59 y.o.   MRN: [id number]  HPI Comments: Alexander English is a 59 year old male with PMH as below here with acute c/o red urine.    Hematuria This is a new problem. The current episode started in the past 7 days (started 3 day agos). The problem is unchanged. He describes the hematuria as gross hematuria. He reports no clotting in his urine stream. His pain is at a severity of 0/10. He is experiencing no pain. He describes his urine color as dark red. Irritative symptoms include urgency. Irritative symptoms do not include frequency or nocturia. urgency x 1 year. Obstructive symptoms do not include dribbling, incomplete emptying, an intermittent stream, a slower stream, straining or a weak stream. Pertinent negatives include no abdominal pain, chills, dysuria, facial swelling, fever, flank pain, genital pain, hesitancy, inability to urinate, nausea or vomiting. He is sexually active. His past medical history is significant for recent infection, STDs and tobacco use. There is no history of BPH, GU trauma, hypertension, kidney stones, prostatitis or sickle cell disease. (Treated for chlamydia as a young adult; 0.5-1PPD x 35 years; quit 7 years; sinusitis 2 months ago)     Past Medical History  Diagnosis Date  . Embolic stroke involving right middle cerebral artery ([place]) 08/08/2004    Occured after traveling from [place] to [place] where he was hospitalized for 1 month with no further work-up or therapy.  Returned to [place] unable to walk and was admitted to [facility] immediately after landing. Presumed embolic per neuro but TEE, bubble study, and hypercoag W/U negative. On indefinite coumadin per patient's informed preference.  Residual effects : Left spastic hemiplegia. Tx with phenol tibi  . Pulmonary embolism ([place]) 09/30/2004    Diagnosed in April 2006 after returning from [place].  Likely provoked as it occurred after a 12 hour plane flight  within 2 months of R MCA CVA with left hemiparesis. Patient has made an informed decision to remain on lifelong warfarin.   . Chronic low back pain 06/12/2013    L4-5 facet arthropathy   . Osteoarthritis of both knees 01/26/2012  . Chronic pain of both shoulders 12/07/2013    A/C joint osteoarthritis   . Obesity (BMI 30.0-34.9) 12/07/2013  . Chronic constipation 06/12/2013  . Positive PPD 12/07/2013  . Cluster headaches     Resolved in 2006  . Hyperplastic colon polyp 07/27/2012    Excised endoscopically 07/27/2012   Current Outpatient Prescriptions on File Prior to Visit  Medication Sig Dispense Refill  . aspirin EC 81 MG tablet Take 81 mg by mouth daily.    Marland Kitchen atorvastatin (LIPITOR) 10 MG tablet Take 1 tablet (10 mg total) by mouth daily. Void other Rx for atorvastatin. Patient requesting 90 days supply. 90 tablet 3  . diclofenac sodium (VOLTAREN) 1 % GEL Apply topically 4 (four) times daily.    . meloxicam (MOBIC) 7.5 MG tablet Take 1 tablet (7.5 mg total) by mouth daily as needed for pain. 90 tablet 3  . Multiple Vitamin (MULTIVITAMIN) tablet Take 1 tablet by mouth daily.    . traMADol (ULTRAM) 50 MG tablet Take 1 tablet (50 mg total) by mouth every 6 (six) hours as needed for severe pain. 60 tablet 0  . warfarin (COUMADIN) 5 MG tablet TAKE ONE TABLET BY MOUTH ONCE DAILY EXCEPT TAKE 0.5 TABLETS BY MOUTH ON SUNDAYS. Dispense 90 day supply. 84 tablet 3   No  current facility-administered medications on file prior to visit.    Review of Systems  Constitutional: Negative for fever, chills, appetite change and unexpected weight change.  HENT: Negative for congestion, facial swelling, rhinorrhea, sinus pressure, sneezing and sore throat.   Respiratory: Negative for shortness of breath.   Cardiovascular: Positive for leg swelling.  Gastrointestinal: Negative for nausea, vomiting, abdominal pain, diarrhea, constipation and blood in stool.  Genitourinary: Positive for urgency and hematuria.  Negative for dysuria, hesitancy, frequency, flank pain, decreased urine volume, difficulty urinating, penile pain, incomplete emptying and nocturia.  Musculoskeletal: Negative for back pain.  Neurological: Negative for syncope and light-headedness.       Filed Vitals:   06/24/15 0827  BP: 125/68  Pulse: 82  Temp: 98.5 F (36.9 C)  TempSrc: Oral  Height: 6' (1.829 m)  Weight: 229 lb 1.6 oz (103.919 kg)  SpO2: 100%    Objective:   Physical Exam  Constitutional: He is oriented to person, place, and time. He appears well-developed. No distress.  HENT:  Head: Normocephalic and atraumatic.  Mouth/Throat: Oropharynx is clear and moist. No oropharyngeal exudate.  Eyes: Conjunctivae and EOM are normal. Pupils are equal, round, and reactive to light.  Neck: Neck supple.  Cardiovascular: Normal rate, regular rhythm and normal heart sounds.  Exam reveals no gallop and no friction rub.   No murmur heard. Pulmonary/Chest: Effort normal and breath sounds normal. No respiratory distress. He has no wheezes. He has no rales.  Abdominal: Soft. Bowel sounds are normal. He exhibits no distension and no mass. There is no tenderness. There is no rebound and no guarding.  Musculoskeletal: Normal range of motion. He exhibits no edema or tenderness.  Neurological: He is alert and oriented to person, place, and time. No cranial nerve deficit.  Skin: Skin is warm. He is not diaphoretic.  Psychiatric: He has a normal mood and affect. His behavior is normal. Judgment and thought content normal.  Vitals reviewed.         Assessment & Plan:  Please see problem based charting for A&P.

## 2015-06-24 NOTE — Patient Instructions (Addendum)
1. Your INR is 3.7 today.  Please DO NOT take your coumadin TODAY.  You can START taking your coumadin tomorrow at the following dose: 2.5 mg (half tablet) on Sunday and 5mg  (whole tablet) Monday - Saturday. Please STOP taking meloxicam.  You can keep taking your baby aspirin (one per day). Once I get the results of urinalysis and blood work back I will let you know what other tests or referrals are needed.    2. Please take all medications as prescribed.    3. If you have worsening of your symptoms or new symptoms arise, please call the clinic [phone]), or go to the ER immediately if symptoms are severe.  Please return as scheduled for your next INR check on 07/06/15.

## 2015-06-24 NOTE — Assessment & Plan Note (Addendum)
Assessment:  Gross hematuria x 3 days.  No S&S of infection, no kidney stone hx, no prior GU trauma, no S&S of glomerular dx.  Worked at gas station back in the 80s for about 6 years.  Has smoking hx but no weight loss, anorexia or other symptoms of malignancy.  Dose of coumadin recently increased from 32.5 to 35 ( 3 weeks ago).  He reports compliance.  Also uses Mobic daily but no other OTC NSAIDs. Plan:   - poc INR supratherapeutic at 3.7 --> hold today's coumadin dose; decrease coumadin dose from 35mg  back to 32.5mg ; recheck INR 07/06/15 - urinalysis with microscopic,  BMP, CBC pending - d/c Mobic - further evaluation or referral pending above results - patient advised to return to clinic if bleeding persists despite above changes

## 2015-06-24 NOTE — Progress Notes (Signed)
Internal Medicine Clinic Attending  Case discussed with Dr. Wilson soon after the resident saw the patient.  We reviewed the resident's history and exam and pertinent patient test results.  I agree with the assessment, diagnosis, and plan of care documented in the resident's note.  

## 2015-06-24 NOTE — Addendum Note (Signed)
Addended by: Gilles Chiquito B on: 06/24/2015 11:00 PM   Modules accepted: Level of Service

## 2015-06-25 ENCOUNTER — Emergency Department (INDEPENDENT_AMBULATORY_CARE_PROVIDER_SITE_OTHER)
Admission: EM | Admit: 2015-06-25 | Discharge: 2015-06-25 | Disposition: A | Discharge status: Home / Self Care | Payer: Commercial Managed Care - HMO | Source: Home / Self Care | Attending: Family Medicine | Admitting: Family Medicine

## 2015-06-25 ENCOUNTER — Encounter (HOSPITAL_COMMUNITY): Payer: Self-pay | Admitting: *Deleted

## 2015-06-25 ENCOUNTER — Telehealth: Payer: Self-pay | Admitting: *Deleted

## 2015-06-25 DIAGNOSIS — R899 Unspecified abnormal finding in specimens from other organs, systems and tissues: Secondary | ICD-10-CM

## 2015-06-25 DIAGNOSIS — Z0000 Encounter for general adult medical examination without abnormal findings: Secondary | ICD-10-CM

## 2015-06-25 LAB — POCT I-STAT, CHEM 8
BUN: 13 mg/dL (ref 6–20)
Calcium, Ion: 1.22 mmol/L (ref 1.13–1.30)
Chloride: 105 mmol/L (ref 101–111)
Creatinine, Ser: 0.9 mg/dL (ref 0.61–1.24)
Glucose, Bld: 116 mg/dL — ABNORMAL HIGH (ref 65–99)
HCT: 48 % (ref 39.0–52.0)
Hemoglobin: 16.3 g/dL (ref 13.0–17.0)
Potassium: 3.6 mmol/L (ref 3.5–5.1)
Sodium: 146 mmol/L — ABNORMAL HIGH (ref 135–145)
TCO2: 26 mmol/L (ref 0–100)

## 2015-06-25 LAB — MICROSCOPIC EXAMINATION
Casts: NONE SEEN /lpf
RBC, UA: >30 /hpf — AB (ref 0–?)

## 2015-06-25 LAB — URINALYSIS, ROUTINE W REFLEX MICROSCOPIC
Bilirubin, UA: NEGATIVE
Glucose, UA: NEGATIVE
Ketones, UA: NEGATIVE
Leukocytes, UA: NEGATIVE
Nitrite, UA: NEGATIVE
Protein, UA: NEGATIVE
Specific Gravity, UA: 1.017 (ref 1.005–1.030)
Urobilinogen, Ur: 0.2 mg/dL (ref 0.2–1.0)
pH, UA: 5 (ref 5.0–7.5)

## 2015-06-25 NOTE — Discharge Instructions (Signed)
Your sodium level was 146 tonight, you may call your doctor and advise of normal result.

## 2015-06-25 NOTE — ED Provider Notes (Signed)
CSN: [id number]     Arrival date & time 06/25/15  1752 History   First MD Initiated Contact with Patient 06/25/15 1907     Chief Complaint  Patient presents with  . Follow-up   (Consider location/radiation/quality/duration/timing/severity/associated sxs/prior Treatment) Patient is a 59 y.o. male presenting with general illness. The history is provided by the patient.  Illness Location:  Had lab drawn on 12/28 and sodium was 173, called by lmd and here for recheck. pt with no sx. Progression:  Unchanged Chronicity:  New Associated symptoms: no abdominal pain, no chest pain, no diarrhea, no fever, no nausea and no vomiting     Past Medical History  Diagnosis Date  . Embolic stroke involving right middle cerebral artery ([place]) 08/08/2004    Occured after traveling from [place] to [place] where he was hospitalized for 1 month with no further work-up or therapy.  Returned to [place] unable to walk and was admitted to [facility] immediately after landing. Presumed embolic per neuro but TEE, bubble study, and hypercoag W/U negative. On indefinite coumadin per patient's informed preference.  Residual effects : Left spastic hemiplegia. Tx with phenol tibi  . Pulmonary embolism ([place]) 09/30/2004    Diagnosed in April 2006 after returning from [place].  Likely provoked as it occurred after a 12 hour plane flight within 2 months of R MCA CVA with left hemiparesis. Patient has made an informed decision to remain on lifelong warfarin.   . Chronic low back pain 06/12/2013    L4-5 facet arthropathy   . Osteoarthritis of both knees 01/26/2012  . Chronic pain of both shoulders 12/07/2013    A/C joint osteoarthritis   . Obesity (BMI 30.0-34.9) 12/07/2013  . Chronic constipation 06/12/2013  . Positive PPD 12/07/2013  . Cluster headaches     Resolved in 2006  . Hyperplastic colon polyp 07/27/2012    Excised endoscopically 07/27/2012   Past Surgical History  Procedure Laterality Date  . Foot surgery Bilateral     Bunion  .  I&d extremity Left 11/28/2001    Left thumb thenar space abscess.   Family History  Problem Relation Age of Onset  . Stroke Maternal Grandmother   . Unexplained death Mother   . Depression Mother     After husband's death  . Unexplained death Father   . Osteoarthritis Father     Knees  . Early death Sister   . Seizures Sister   . Early death Brother 59    Unknown cause  . Healthy Daughter   . Healthy Son   . Stroke Maternal Aunt   . Healthy Sister   . Healthy Sister   . Unexplained death Brother   . Healthy Brother   . Healthy Son    Social History  Substance Use Topics  . Smoking status: Former Smoker    Quit date: 06/28/2007  . Smokeless tobacco: Never Used  . Alcohol Use: No     Comment: Remote past    Review of Systems  Constitutional: Negative.  Negative for fever.  Cardiovascular: Negative.  Negative for chest pain.  Gastrointestinal: Negative for nausea, vomiting, abdominal pain and diarrhea.  Genitourinary: Negative.   Neurological: Negative for weakness.  All other systems reviewed and are negative.   Allergies  Review of patient's allergies indicates no known allergies.  Home Medications   Prior to Admission medications   Medication Sig Start Date End Date Taking? Authorizing Provider  aspirin EC 81 MG tablet Take 81 mg by mouth daily.  Historical Provider, MD  atorvastatin (LIPITOR) 10 MG tablet Take 1 tablet (10 mg total) by mouth daily. Void other Rx for atorvastatin. Patient requesting 90 days supply. 06/16/14   Sid Falcon, MD  diclofenac sodium (VOLTAREN) 1 % GEL Apply topically 4 (four) times daily.    Historical Provider, MD  Multiple Vitamin (MULTIVITAMIN) tablet Take 1 tablet by mouth daily.    Historical Provider, MD  traMADol (ULTRAM) 50 MG tablet Take 1 tablet (50 mg total) by mouth every 6 (six) hours as needed for severe pain. 03/27/15   [name], MD  warfarin (COUMADIN) 5 MG tablet TAKE ONE TABLET BY MOUTH ONCE DAILY EXCEPT  TAKE 0.5 TABLETS BY MOUTH ON SUNDAYS. Dispense 90 day supply. 04/13/15   Aldine Contes, MD   Meds Ordered and Administered this Visit  Medications - No data to display  BP 126/74 mmHg  Pulse 86  Temp(Src) 97.8 F (36.6 C) (Oral)  Resp 16  SpO2 100% No data found.   Physical Exam  Constitutional: He is oriented to person, place, and time. He appears well-developed and well-nourished. No distress.  Cardiovascular: Normal heart sounds and intact distal pulses.   Pulmonary/Chest: Effort normal and breath sounds normal.  Musculoskeletal: He exhibits no edema.  Neurological: He is alert and oriented to person, place, and time.  Skin: Skin is warm and dry.  Nursing note and vitals reviewed.   ED Course  Procedures (including critical care time)  Labs Review Labs Reviewed  POCT I-STAT, CHEM 8 - Abnormal; Notable for the following:    Sodium 146 (*)    Glucose, Bld 116 (*)    All other components within normal limits   Labs as noted Na was 146.  Imaging Review No results found.   Visual Acuity Review  Right Eye Distance:   Left Eye Distance:   Bilateral Distance:    Right Eye Near:   Left Eye Near:    Bilateral Near:         MDM   1. Abnormal laboratory test result        Billy Fischer, MD 06/25/15 2006

## 2015-06-25 NOTE — Telephone Encounter (Signed)
Call from patient would like to get a referral to Dr. Herbert Deaner to have his eyes examined  for [facility].  Patient stated that he has contacted the office and they have asked for a referral so that his insurance will cover.  Dr. Herbert Deaner will fill out forms that will them be sent to Dr. Eppie Gibson for completion for [facility]  Sander Nephew, RN 06/25/2015 5:00 PM

## 2015-06-25 NOTE — Telephone Encounter (Signed)
Ambulatory referral for Dr. Herbert Deaner has been placed.  Thank You.

## 2015-06-25 NOTE — ED Notes (Addendum)
Pt  Was  Told  His  Sodium  Was  Elevated           Pt  Was  Advised  To  Come  Here  To have  It  Rechecked    He   Reports    No  Symptoms  His  Doctor called  Him  With  The  Results  today

## 2015-06-26 ENCOUNTER — Other Ambulatory Visit: Payer: Self-pay | Admitting: Internal Medicine

## 2015-06-26 DIAGNOSIS — R319 Hematuria, unspecified: Secondary | ICD-10-CM

## 2015-06-28 HISTORY — PX: LITHOTRIPSY: SUR834

## 2015-07-01 LAB — BMP8+ANION GAP
Anion Gap: 18 mmol/L (ref 10.0–18.0)
BUN/Creatinine Ratio: 16 (ref 10–22)
BUN: 14 mg/dL (ref 8–27)
CO2: 20 mmol/L (ref 18–29)
Calcium: 9.9 mg/dL (ref 8.6–10.2)
Chloride: 105 mmol/L (ref 96–106)
Creatinine, Ser: 0.88 mg/dL (ref 0.76–1.27)
GFR calc Af Amer: 108 mL/min/{1.73_m2} (ref >59)
GFR calc non Af Amer: 93 mL/min/{1.73_m2} (ref >59)
Glucose: 105 mg/dL — ABNORMAL HIGH (ref 65–99)
Potassium: 4.1 mmol/L (ref 3.5–5.2)
Sodium: 143 mmol/L (ref 134–144)

## 2015-07-01 LAB — CBC
Hematocrit: 43.3 % (ref 37.5–51.0)
Hemoglobin: 14.1 g/dL (ref 12.6–17.7)
MCH: 26.2 pg — ABNORMAL LOW (ref 26.6–33.0)
MCHC: 32.6 g/dL (ref 31.5–35.7)
MCV: 81 fL (ref 79–97)
Platelets: 196 10*3/uL (ref 150–379)
RBC: 5.38 x10E6/uL (ref 4.14–5.80)
RDW: 14.8 % (ref 12.3–15.4)
WBC: 4.6 10*3/uL (ref 3.4–10.8)

## 2015-07-06 ENCOUNTER — Ambulatory Visit: Payer: Medicare HMO

## 2015-07-06 ENCOUNTER — Ambulatory Visit: Payer: Commercial Managed Care - HMO | Admitting: Internal Medicine

## 2015-07-06 ENCOUNTER — Ambulatory Visit: Payer: Commercial Managed Care - HMO

## 2015-07-06 ENCOUNTER — Ambulatory Visit: Payer: Commercial Managed Care - HMO | Admitting: Pulmonary Disease

## 2015-07-14 ENCOUNTER — Ambulatory Visit (INDEPENDENT_AMBULATORY_CARE_PROVIDER_SITE_OTHER): Payer: Commercial Managed Care - HMO | Admitting: Internal Medicine

## 2015-07-14 ENCOUNTER — Ambulatory Visit (INDEPENDENT_AMBULATORY_CARE_PROVIDER_SITE_OTHER): Payer: Commercial Managed Care - HMO | Admitting: Pharmacist

## 2015-07-14 ENCOUNTER — Encounter: Payer: Self-pay | Admitting: Internal Medicine

## 2015-07-14 VITALS — BP 124/77 | HR 76 | Temp 98.0°F | Wt 227.5 lb

## 2015-07-14 DIAGNOSIS — R319 Hematuria, unspecified: Secondary | ICD-10-CM

## 2015-07-14 DIAGNOSIS — Z7901 Long term (current) use of anticoagulants: Secondary | ICD-10-CM | POA: Diagnosis not present

## 2015-07-14 DIAGNOSIS — M170 Bilateral primary osteoarthritis of knee: Secondary | ICD-10-CM | POA: Diagnosis not present

## 2015-07-14 DIAGNOSIS — R351 Nocturia: Secondary | ICD-10-CM

## 2015-07-14 DIAGNOSIS — R3915 Urgency of urination: Secondary | ICD-10-CM | POA: Diagnosis not present

## 2015-07-14 DIAGNOSIS — I2782 Chronic pulmonary embolism: Secondary | ICD-10-CM | POA: Diagnosis not present

## 2015-07-14 LAB — POCT INR: INR: 2.6

## 2015-07-14 MED ORDER — DICLOFENAC SODIUM 1 % TD GEL: 2.0000 g | Freq: Four times a day (QID) | TRANSDERMAL | Status: DC

## 2015-07-14 NOTE — Progress Notes (Signed)
Patient ID: Alexander English, male   DOB: 18-Aug-1955, 60 y.o.   MRN: [id number]    Subjective:   [name]   DOB: [birth year] 60 y.o.   MRN: [id number]  HPI: Alexander English is a 60 y.o. male with PMH as below, here for f/u hematuria.  Please see Problem-Based charting for the status of the patient's chronic medical issues.     Past Medical History  Diagnosis Date  . Embolic stroke involving right middle cerebral artery ([place]) 08/08/2004    Occured after traveling from [place] to [place] where he was hospitalized for 1 month with no further work-up or therapy.  Returned to [place] unable to walk and was admitted to [facility] immediately after landing. Presumed embolic per neuro but TEE, bubble study, and hypercoag W/U negative. On indefinite coumadin per patient's informed preference.  Residual effects : Left spastic hemiplegia. Tx with phenol tibi  . Pulmonary embolism ([place]) 09/30/2004    Diagnosed in April 2006 after returning from [place].  Likely provoked as it occurred after a 12 hour plane flight within 2 months of R MCA CVA with left hemiparesis. Patient has made an informed decision to remain on lifelong warfarin.   . Chronic low back pain 06/12/2013    L4-5 facet arthropathy   . Osteoarthritis of both knees 01/26/2012  . Chronic pain of both shoulders 12/07/2013    A/C joint osteoarthritis   . Obesity (BMI 30.0-34.9) 12/07/2013  . Chronic constipation 06/12/2013  . Positive PPD 12/07/2013  . Cluster headaches     Resolved in 2006  . Hyperplastic colon polyp 07/27/2012    Excised endoscopically 07/27/2012   Current Outpatient Prescriptions  Medication Sig Dispense Refill  . aspirin EC 81 MG tablet Take 81 mg by mouth daily.    Marland Kitchen atorvastatin (LIPITOR) 10 MG tablet Take 1 tablet (10 mg total) by mouth daily. Void other Rx for atorvastatin. Patient requesting 90 days supply. 90 tablet 3  . diclofenac sodium (VOLTAREN) 1 % GEL Apply topically 4 (four) times daily.      . Multiple Vitamin (MULTIVITAMIN) tablet Take 1 tablet by mouth daily.    . traMADol (ULTRAM) 50 MG tablet Take 1 tablet (50 mg total) by mouth every 6 (six) hours as needed for severe pain. 60 tablet 0  . warfarin (COUMADIN) 5 MG tablet TAKE ONE TABLET BY MOUTH ONCE DAILY EXCEPT TAKE 0.5 TABLETS BY MOUTH ON SUNDAYS. Dispense 90 day supply. 84 tablet 3   No current facility-administered medications for this visit.   Family History  Problem Relation Age of Onset  . Stroke Maternal Grandmother   . Unexplained death Mother   . Depression Mother     After husband's death  . Unexplained death Father   . Osteoarthritis Father     Knees  . Early death Sister   . Seizures Sister   . Early death Brother 67    Unknown cause  . Healthy Daughter   . Healthy Son   . Stroke Maternal Aunt   . Healthy Sister   . Healthy Sister   . Unexplained death Brother   . Healthy Brother   . Healthy Son    Social History   Social History  . Marital Status: Divorced    Spouse Name: N/A  . Number of Children: N/A  . Years of Education: N/A   Social History Main Topics  . Smoking status: Former Smoker    Quit date: 06/28/2007  . Smokeless  tobacco: Never Used  . Alcohol Use: No     Comment: Remote past  . Drug Use: No  . Sexual Activity: Yes    Birth Control/ Protection: None   Other Topics Concern  . Not on file   Social History Narrative   Born in [place] but has been in the [place] for > 30 years.  Divorced, 1 daughter and 2 sons.  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   Review of Systems:  He denies fever, chills, lightheadedness, dizziness, CP, SOB, DOE, or dysuria.  However, he is having urinary urgency and frequency at night.  Objective:  Physical Exam: There were no vitals filed for this visit. Physical Exam  Constitutional: He is oriented to person, place, and time and well-developed, well-nourished, and in no distress. No distress.   HENT:  Head: Normocephalic and atraumatic.  Cardiovascular: Normal rate, regular rhythm and normal heart sounds.   Pulmonary/Chest: Effort normal and breath sounds normal. No stridor. No respiratory distress. He has no wheezes.  Neurological: He is alert and oriented to person, place, and time.  Skin: Skin is warm and dry. He is not diaphoretic.     Assessment & Plan:   Patient and case were discussed with Dr. Eppie Gibson.  Please refer to Problem Based charting for further documentation.

## 2015-07-14 NOTE — Progress Notes (Signed)
Case discussed with Dr. Taylor at the time of the visit. We reviewed the resident's history and exam and pertinent patient test results. I agree with the assessment, diagnosis, and plan of care documented in the resident's note. 

## 2015-07-14 NOTE — Assessment & Plan Note (Signed)
Patient here for recheck hematuria.  He denies repeat episode of hematuria.  He denies fever, chills, lightheadedness, dizziness, CP, SOB, DOE, or dysuria.  However, he is having urinary urgency and frequency at night.  He has been drinking increased amount of fluid because he was told his sodium was too high (2/2 laboratory error).  A/P: Hematuria, improving.  Patient has Urology f/u scheduled for 2/13 at 2pm.  He will likely need cystoscopy.  Urinary urgency and frequency likely 2/2 prostate, which patient was advised to also address with urology.  Until then, decrease PO fluids after 8 pm.  No s/sx c/f anemia, so no need to check CBC. - F/u with Urology - Decrease fluids after 8 pm.

## 2015-07-14 NOTE — Assessment & Plan Note (Signed)
Patient continues to complain of bilateral knee pain.  PT is recommending a knee brace that extends up the hip, but the patient does not believe he can manage the brace with only one hand.  Ortho is offering surgery, but patient does not want surgery or a large medical bill a/w surgery.   Meloxicam was stopped 2/2 hematuria.  He has not been using Tramadol because he is worried about addiction.  He has not been using Voltaren gel because the prescription was old and expired.  A/P: bilateral OA, chronic.  Patient's worries about managing brace and surgery are warranted and understandable.  He continues to ambulate with cane.  Patient should continue with PT.  He was counseled about Tramadol and its low potential for addiction when taken as needed.  Will refill Voltaren. - Voltaren gel - Tramadol PRN

## 2015-07-14 NOTE — Patient Instructions (Addendum)
  1. Follow up with Dr. Karsten Ro (Urology) at [place], [place] [address], on Feb 13, at 2 PM. 2. Take Tramadol as needed.  It is low potential for addiction. 3. Apply Voltaren gel four times a day for pain relief.  If the medication is too expensive, please call the clinic and ask for an alternative.   Hematuria, Adult Hematuria is blood in your urine. It can be caused by a bladder infection, kidney infection, prostate infection, kidney stone, or cancer of your urinary tract. Infections can usually be treated with medicine, and a kidney stone usually will pass through your urine. If neither of these is the cause of your hematuria, further workup to find out the reason may be needed. It is very important that you tell your health care provider about any blood you see in your urine, even if the blood stops without treatment or happens without causing pain. Blood in your urine that happens and then stops and then happens again can be a symptom of a very serious condition. Also, pain is not a symptom in the initial stages of many urinary cancers. HOME CARE INSTRUCTIONS   Drink lots of fluid, 3-4 quarts a day. If you have been diagnosed with an infection, cranberry juice is especially recommended, in addition to large amounts of water.  Avoid caffeine, tea, and carbonated beverages because they tend to irritate the bladder.  Avoid alcohol because it may irritate the prostate.  Take all medicines as directed by your health care provider.  If you were prescribed an antibiotic medicine, finish it all even if you start to feel better.  If you have been diagnosed with a kidney stone, follow your health care provider's instructions regarding straining your urine to catch the stone.  Empty your bladder often. Avoid holding urine for long periods of time.  After a bowel movement, women should cleanse front to back. Use each tissue only once.  Empty your bladder before and after sexual  intercourse if you are a male. SEEK MEDICAL CARE IF:  You develop back pain.  You have a fever.  You have a feeling of sickness in your stomach (nausea) or vomiting.  Your symptoms are not better in 3 days. Return sooner if you are getting worse. SEEK IMMEDIATE MEDICAL CARE IF:   You develop severe vomiting and are unable to keep the medicine down.  You develop severe back or abdominal pain despite taking your medicines.  You begin passing a large amount of blood or clots in your urine.  You feel extremely weak or faint, or you pass out. MAKE SURE YOU:   Understand these instructions.  Will watch your condition.  Will get help right away if you are not doing well or get worse.   This information is not intended to replace advice given to you by your health care provider. Make sure you discuss any questions you have with your health care provider.   Document Released: 06/13/2005 Document Revised: 07/04/2014 Document Reviewed: 02/11/2013 Elsevier Interactive Patient Education [employer].

## 2015-07-15 NOTE — Progress Notes (Signed)
Anticoagulation Management Alexander English is a 60 y.o. male who reports to the clinic for monitoring of warfarin treatment.    Indication: PE Duration: indefinite  Anticoagulation Clinic Visit History:  Anticoagulation Episode Summary    Current INR goal 2.0-3.0  Next INR check 08/03/2015  INR from last check   Weekly max dose   Target end date Indefinite  [facility]  Preferred lab   Send INR reminders to    Indications  Pulmonary embolism ([place]) [I26.99]        Comments        ASSESSMENT Recent Results: Recent results are below, the most recent result is correlated with a dose of 32.5 mg per week: Lab Results  Component Value Date   INR 2.6 07/14/2015   INR 3.7 06/24/2015   INR 2.20 06/08/2015   INR today: Therapeutic  Anticoagulation Dosing: INR as of 07/14/2015 and Previous Dosing Information    INR Dt INR Goal Wkly Tot Sun Mon Tue Wed Thu Fri Sat     2.0-3.0 35 mg 5 mg 5 mg 5 mg 5 mg 5 mg 5 mg 5 mg    Anticoagulation Dose Instructions as of 07/14/2015      Total Sun Mon Tue Wed Thu Fri Sat   New Dose 32.5 mg 2.5 mg 5 mg 5 mg 5 mg 5 mg 5 mg 5 mg     (5 mg x 0.5)  (5 mg x 1)  (5 mg x 1)  (5 mg x 1)  (5 mg x 1)  (5 mg x 1)  (5 mg x 1)                         Description        Patient reports he has been taking 1/2 tablet on Sundays, 1 tablet daily on all other days       PLAN Weekly dose was unchanged (of note, patient reports he has been taking 1/2 tablet on Sundays, so continued the same)  Patient Instructions  Patient educated about medication as defined in this encounter and verbalized understanding by repeating back instructions provided.   Follow-up Return in about 3 weeks (around 08/03/2015) for Follow up INR on 08/03/15 at 10 am.  Kim,Jennifer J

## 2015-07-15 NOTE — Patient Instructions (Signed)
Patient educated about medication as defined in this encounter and verbalized understanding by repeating back instructions provided.   

## 2015-07-16 ENCOUNTER — Other Ambulatory Visit: Payer: Self-pay | Admitting: Internal Medicine

## 2015-07-16 DIAGNOSIS — E785 Hyperlipidemia, unspecified: Secondary | ICD-10-CM

## 2015-07-16 NOTE — Progress Notes (Signed)
Indication: Thromboembolism. Duration: Lifelong per patient preference. INR at target. I agree with Dr. Julianne Rice assessment and plan as documented.

## 2015-07-17 ENCOUNTER — Encounter: Payer: Commercial Managed Care - HMO | Admitting: Internal Medicine

## 2015-07-31 ENCOUNTER — Telehealth: Payer: Self-pay | Admitting: Internal Medicine

## 2015-07-31 NOTE — Telephone Encounter (Signed)
APPT. REMINDER CALL/LMTCB IS HE NEEDS TO CANCEL °

## 2015-08-03 ENCOUNTER — Encounter: Payer: Self-pay | Admitting: Internal Medicine

## 2015-08-03 ENCOUNTER — Ambulatory Visit (INDEPENDENT_AMBULATORY_CARE_PROVIDER_SITE_OTHER): Payer: Commercial Managed Care - HMO | Admitting: Pharmacist

## 2015-08-03 ENCOUNTER — Ambulatory Visit (INDEPENDENT_AMBULATORY_CARE_PROVIDER_SITE_OTHER): Payer: Commercial Managed Care - HMO | Admitting: Internal Medicine

## 2015-08-03 VITALS — BP 117/73 | HR 71 | Temp 98.1°F | Resp 18 | Ht 72.0 in | Wt 228.7 lb

## 2015-08-03 DIAGNOSIS — R319 Hematuria, unspecified: Secondary | ICD-10-CM

## 2015-08-03 DIAGNOSIS — I2699 Other pulmonary embolism without acute cor pulmonale: Secondary | ICD-10-CM | POA: Diagnosis not present

## 2015-08-03 DIAGNOSIS — J209 Acute bronchitis, unspecified: Secondary | ICD-10-CM

## 2015-08-03 DIAGNOSIS — Z7901 Long term (current) use of anticoagulants: Secondary | ICD-10-CM

## 2015-08-03 LAB — POCT INR: INR: 3.2

## 2015-08-03 MED ORDER — GUAIFENESIN-CODEINE 100-10 MG/5ML PO SOLN: 5.0000 mL | Freq: Three times a day (TID) | ORAL | Status: DC | PRN

## 2015-08-03 NOTE — Progress Notes (Signed)
INTERNAL MEDICINE TEACHING ATTENDING ADDENDUM - Rebeca Valdivia M.D  Duration- indefinite, Indication- PE, INR- supratherapeutic. Agree with pharmacy recommendations as outlined in their note.     

## 2015-08-03 NOTE — Patient Instructions (Signed)
General Instructions:   Please bring your medicines with you each time you come to clinic.  Medicines may include prescription medications, over-the-counter medications, herbal remedies, eye drops, vitamins, or other pills.   Progress Toward Treatment Goals:  No flowsheet data found.  Self Care Goals & Plans:  No flowsheet data found.  No flowsheet data found.   Care Management & Community Referrals:  Referral 08/03/2015  Referrals made to community resources falls prevention       Acute Bronchitis Bronchitis is inflammation of the airways that extend from the windpipe into the lungs (bronchi). The inflammation often causes mucus to develop. This leads to a cough, which is the most common symptom of bronchitis.  In acute bronchitis, the condition usually develops suddenly and goes away over time, usually in a couple weeks. Smoking, allergies, and asthma can make bronchitis worse. Repeated episodes of bronchitis may cause further lung problems.  CAUSES Acute bronchitis is most often caused by the same virus that causes a cold. The virus can spread from person to person (contagious) through coughing, sneezing, and touching contaminated objects. SIGNS AND SYMPTOMS   Cough.   Fever.   Coughing up mucus.   Body aches.   Chest congestion.   Chills.   Shortness of breath.   Sore throat.  DIAGNOSIS  Acute bronchitis is usually diagnosed through a physical exam. Your health care provider will also ask you questions about your medical history. Tests, such as chest X-rays, are sometimes done to rule out other conditions.  TREATMENT  Acute bronchitis usually goes away in a couple weeks. Oftentimes, no medical treatment is necessary. Medicines are sometimes given for relief of fever or cough. Antibiotic medicines are usually not needed but may be prescribed in certain situations. In some cases, an inhaler may be recommended to help reduce shortness of breath and control the  cough. A cool mist vaporizer may also be used to help thin bronchial secretions and make it easier to clear the chest.  HOME CARE INSTRUCTIONS  Get plenty of rest.   Drink enough fluids to keep your urine clear or pale yellow (unless you have a medical condition that requires fluid restriction). Increasing fluids may help thin your respiratory secretions (sputum) and reduce chest congestion, and it will prevent dehydration.   Take medicines only as directed by your health care provider.  If you were prescribed an antibiotic medicine, finish it all even if you start to feel better.  Avoid smoking and secondhand smoke. Exposure to cigarette smoke or irritating chemicals will make bronchitis worse. If you are a smoker, consider using nicotine gum or skin patches to help control withdrawal symptoms. Quitting smoking will help your lungs heal faster.   Reduce the chances of another bout of acute bronchitis by washing your hands frequently, avoiding people with cold symptoms, and trying not to touch your hands to your mouth, nose, or eyes.   Keep all follow-up visits as directed by your health care provider.  SEEK MEDICAL CARE IF: Your symptoms do not improve after 1 week of treatment.  SEEK IMMEDIATE MEDICAL CARE IF:  You develop an increased fever or chills.   You have chest pain.   You have severe shortness of breath.  You have bloody sputum.   You develop dehydration.  You faint or repeatedly feel like you are going to pass out.  You develop repeated vomiting.  You develop a severe headache. MAKE SURE YOU:   Understand these instructions.  Will watch your condition.  Will get help right away if you are not doing well or get worse.   This information is not intended to replace advice given to you by your health care provider. Make sure you discuss any questions you have with your health care provider.   Document Released: 07/21/2004 Document Revised: 07/04/2014  Document Reviewed: 12/04/2012 Elsevier Interactive Patient Education [employer].

## 2015-08-03 NOTE — Progress Notes (Signed)
Anti-Coagulation Progress Note  Alexander English is a 60 y.o. male who is currently on an anti-coagulation regimen.    RECENT RESULTS: Recent results are below, the most recent result is correlated with a dose of 32.5 mg. per week: Lab Results  Component Value Date   INR 3.20 08/03/2015   INR 2.6 07/14/2015   INR 3.7 06/24/2015    ANTI-COAG DOSE: Anticoagulation Dose Instructions as of 08/03/2015      [name] Tue Wed Thu Fri Sat   New Dose 5 mg 2.5 mg 5 mg 5 mg 2.5 mg 5 mg 5 mg       ANTICOAG SUMMARY: Anticoagulation Episode Summary    Current INR goal 2.0-3.0  Next INR check 08/24/2015  INR from last check 3.20! (08/03/2015)  Weekly max dose   Target end date Indefinite  [facility]  Preferred lab   Send INR reminders to    Indications  Pulmonary embolism ([place]) [I26.99]        Comments         ANTICOAG TODAY: Anticoagulation Summary as of 08/03/2015    INR goal 2.0-3.0  Selected INR 3.20! (08/03/2015)  Next INR check 08/24/2015  Target end date Indefinite   Indications  Pulmonary embolism ([place]) [I26.99]      Anticoagulation Episode Summary    [facility]   Preferred lab    Send INR reminders to    Comments       PATIENT INSTRUCTIONS: Patient Instructions  Patient instructed to take medications as defined in the Anti-coagulation Track section of this encounter.  Patient instructed to take today's dose.  Patient verbalized understanding of these instructions.       FOLLOW-UP Return in 3 weeks (on 08/24/2015) for Follow up INR at 0900h.  [name], III Pharm.D., CACP

## 2015-08-03 NOTE — Progress Notes (Signed)
[facility] Subjective:   [name]   DOB: August 08, 1955 60 y.o.   MRN: [id number]  HPI: [name] is a 60 y.o. male with a PMH detailed below who presents for evaluation of a cold.  He reports that he developed a cough 7 days ago, it is productive of yellow sputum.  He denies any fever or chills.  He denies any myalgias.  No sick contacts.  He did get his flu vaccine this year.  He does report associated mild wheezing.  He has not taken anything for this.  He also notes that he urine has become dark again, he has an appointment with Urology for evaluation of hematuria on 2/13.    Past Medical History  Diagnosis Date  . Embolic stroke involving right middle cerebral artery ([place]) 08/08/2004    Occured after traveling from [place] to [place] where he was hospitalized for 1 month with no further work-up or therapy.  Returned to [place] unable to walk and was admitted to [facility] immediately after landing. Presumed embolic per neuro but TEE, bubble study, and hypercoag W/U negative. On indefinite coumadin per patient's informed preference.  Residual effects : Left spastic hemiplegia. Tx with phenol tibi  . Pulmonary embolism ([place]) 09/30/2004    Diagnosed in April 2006 after returning from [place].  Likely provoked as it occurred after a 12 hour plane flight within 2 months of R MCA CVA with left hemiparesis. Patient has made an informed decision to remain on lifelong warfarin.   . Chronic low back pain 06/12/2013    L4-5 facet arthropathy   . Osteoarthritis of both knees 01/26/2012  . Chronic pain of both shoulders 12/07/2013    A/C joint osteoarthritis   . Obesity (BMI 30.0-34.9) 12/07/2013  . Chronic constipation 06/12/2013  . Positive PPD 12/07/2013  . Cluster headaches     Resolved in 2006  . Hyperplastic colon polyp 07/27/2012    Excised endoscopically 07/27/2012   Current Outpatient Prescriptions  Medication Sig Dispense Refill  . aspirin EC 81  MG tablet Take 81 mg by mouth daily.    Marland Kitchen atorvastatin (LIPITOR) 10 MG tablet Take 1 tablet (10 mg total) by mouth daily. 90 tablet 3  . diclofenac sodium (VOLTAREN) 1 % GEL Apply 2 g topically 4 (four) times daily. 100 g 2  . guaiFENesin-codeine 100-10 MG/5ML syrup Take 5 mLs by mouth 3 (three) times daily as needed for cough. 120 mL 1  . Multiple Vitamin (MULTIVITAMIN) tablet Take 1 tablet by mouth daily.    . traMADol (ULTRAM) 50 MG tablet Take 1 tablet (50 mg total) by mouth every 6 (six) hours as needed for severe pain. (Patient not taking: Reported on 08/03/2015) 60 tablet 0  . warfarin (COUMADIN) 5 MG tablet TAKE ONE TABLET BY MOUTH ONCE DAILY EXCEPT TAKE 0.5 TABLETS BY MOUTH ON SUNDAYS. Dispense 90 day supply. 84 tablet 3   No current facility-administered medications for this visit.   Family History  Problem Relation Age of Onset  . Stroke Maternal Grandmother   . Unexplained death Mother   . Depression Mother     After husband's death  . Unexplained death Father   . Osteoarthritis Father     Knees  . Early death Sister   . Seizures Sister   . Early death Brother 37    Unknown cause  . Healthy Daughter   . Healthy Son   . Stroke Maternal Aunt   . Healthy  Sister   . Healthy Sister   . Unexplained death Brother   . Healthy Brother   . Healthy Son    Social History   Social History  . Marital Status: Divorced    Spouse Name: N/A  . Number of Children: N/A  . Years of Education: N/A   Social History Main Topics  . Smoking status: Former Smoker    Quit date: 06/28/2007  . Smokeless tobacco: Never Used  . Alcohol Use: No     Comment: Remote past  . Drug Use: No  . Sexual Activity: Not Asked   Other Topics Concern  . None   Social History Narrative   Born in [place] but has been in the [place] for > 30 years.  Divorced, 1 daughter and 2 sons.  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   Review of Systems: Review of  Systems  Constitutional: Positive for malaise/fatigue. Negative for fever and chills.  Eyes: Negative for blurred vision.  Respiratory: Positive for cough and sputum production. Negative for hemoptysis.   Cardiovascular: Negative for chest pain.  Gastrointestinal: Negative for abdominal pain.  Genitourinary: Positive for hematuria. Negative for dysuria and urgency.  Musculoskeletal: Negative for myalgias.  Neurological: Negative for weakness.  All other systems reviewed and are negative.    Objective:  Physical Exam: Filed Vitals:   08/03/15 0931  BP: 117/73  Pulse: 71  Temp: 98.1 F (36.7 C)  TempSrc: Oral  Resp: 18  Height: 6' (1.829 m)  Weight: 228 lb 11.2 oz (103.738 kg)  SpO2: 98%  Physical Exam  Constitutional: He appears well-developed and well-nourished.  Cardiovascular: Normal rate and regular rhythm.   Pulmonary/Chest: Effort normal and breath sounds normal. He has no wheezes.  Nursing note and vitals reviewed.   Assessment & Plan:  Case discussed with Dr. Dareen Piano  Acute bronchitis - Rx Guaifensein-codeine cough syrup - Rest, increase fluids - Return if worsening or no improvement in 1 week.  Hematuria -Repeat U/A today - Has appointment with Urology.    Medications Ordered Meds ordered this encounter  Medications  . guaiFENesin-codeine 100-10 MG/5ML syrup    Sig: Take 5 mLs by mouth 3 (three) times daily as needed for cough.    Dispense:  120 mL    Refill:  1   Other Orders Orders Placed This Encounter  Procedures  . Urinalysis, Reflex Microscopic   Follow Up: Return if symptoms worsen or fail to improve.

## 2015-08-03 NOTE — Progress Notes (Signed)
Internal Medicine Clinic Attending  Case discussed with Dr. Hoffman soon after the resident saw the patient.  We reviewed the resident's history and exam and pertinent patient test results.  I agree with the assessment, diagnosis, and plan of care documented in the resident's note. 

## 2015-08-03 NOTE — Patient Instructions (Signed)
Patient instructed to take medications as defined in the Anti-coagulation Track section of this encounter.  Patient instructed to take today's dose.  Patient verbalized understanding of these instructions.    

## 2015-08-03 NOTE — Assessment & Plan Note (Addendum)
-   Rx Guaifensein-codeine cough syrup - Rest, increase fluids - Return if worsening or no improvement in 1 week.

## 2015-08-03 NOTE — Assessment & Plan Note (Signed)
-  Repeat U/A today - Has appointment with Urology.

## 2015-08-04 LAB — URINALYSIS, ROUTINE W REFLEX MICROSCOPIC
Bilirubin, UA: NEGATIVE
Glucose, UA: NEGATIVE
Nitrite, UA: NEGATIVE
Specific Gravity, UA: 1.023 (ref 1.005–1.030)
Urobilinogen, Ur: 1 mg/dL (ref 0.2–1.0)
pH, UA: 5.5 (ref 5.0–7.5)

## 2015-08-04 LAB — MICROSCOPIC EXAMINATION
Casts: NONE SEEN /lpf
RBC, UA: >30 /hpf — AB (ref 0–?)

## 2015-08-07 DIAGNOSIS — H2513 Age-related nuclear cataract, bilateral: Secondary | ICD-10-CM | POA: Diagnosis not present

## 2015-08-07 DIAGNOSIS — H04123 Dry eye syndrome of bilateral lacrimal glands: Secondary | ICD-10-CM | POA: Diagnosis not present

## 2015-08-07 DIAGNOSIS — H25013 Cortical age-related cataract, bilateral: Secondary | ICD-10-CM | POA: Diagnosis not present

## 2015-08-07 DIAGNOSIS — H40013 Open angle with borderline findings, low risk, bilateral: Secondary | ICD-10-CM | POA: Diagnosis not present

## 2015-08-10 DIAGNOSIS — R3915 Urgency of urination: Secondary | ICD-10-CM | POA: Diagnosis not present

## 2015-08-10 DIAGNOSIS — N401 Enlarged prostate with lower urinary tract symptoms: Secondary | ICD-10-CM | POA: Diagnosis not present

## 2015-08-10 DIAGNOSIS — R310 Gross hematuria: Secondary | ICD-10-CM | POA: Diagnosis not present

## 2015-08-10 DIAGNOSIS — N3941 Urge incontinence: Secondary | ICD-10-CM | POA: Diagnosis not present

## 2015-08-10 DIAGNOSIS — Z0000 Encounter for general adult medical examination without abnormal findings: Secondary | ICD-10-CM | POA: Diagnosis not present

## 2015-08-14 ENCOUNTER — Telehealth: Payer: Self-pay | Admitting: Internal Medicine

## 2015-08-14 NOTE — Telephone Encounter (Signed)
APPT. REMINDER CALL, LMTCB IF HE NEEDS TO CANCEL °

## 2015-08-17 ENCOUNTER — Encounter: Payer: Self-pay | Admitting: Internal Medicine

## 2015-08-17 ENCOUNTER — Ambulatory Visit (INDEPENDENT_AMBULATORY_CARE_PROVIDER_SITE_OTHER): Payer: Commercial Managed Care - HMO | Admitting: Pharmacist

## 2015-08-17 ENCOUNTER — Ambulatory Visit (INDEPENDENT_AMBULATORY_CARE_PROVIDER_SITE_OTHER): Payer: Commercial Managed Care - HMO | Admitting: Internal Medicine

## 2015-08-17 ENCOUNTER — Telehealth: Payer: Self-pay | Admitting: Internal Medicine

## 2015-08-17 VITALS — BP 136/80 | HR 74 | Temp 98.1°F | Ht 72.0 in | Wt 226.8 lb

## 2015-08-17 DIAGNOSIS — I63411 Cerebral infarction due to embolism of right middle cerebral artery: Secondary | ICD-10-CM

## 2015-08-17 DIAGNOSIS — Z8673 Personal history of transient ischemic attack (TIA), and cerebral infarction without residual deficits: Secondary | ICD-10-CM

## 2015-08-17 DIAGNOSIS — M170 Bilateral primary osteoarthritis of knee: Secondary | ICD-10-CM

## 2015-08-17 DIAGNOSIS — I2699 Other pulmonary embolism without acute cor pulmonale: Secondary | ICD-10-CM

## 2015-08-17 DIAGNOSIS — Z7901 Long term (current) use of anticoagulants: Secondary | ICD-10-CM | POA: Diagnosis not present

## 2015-08-17 LAB — POCT INR: INR: 2.6

## 2015-08-17 NOTE — Progress Notes (Signed)
Anti-Coagulation Progress Note  Alexander English is a 60 y.o. male who is currently on an anti-coagulation regimen.    RECENT RESULTS: Recent results are below, the most recent result is correlated with a dose of 30 mg. per week: Lab Results  Component Value Date   INR 2.60 08/17/2015   INR 3.20 08/03/2015   INR 2.6 07/14/2015    ANTI-COAG DOSE: Anticoagulation Dose Instructions as of 08/17/2015      [name] Tue Wed Thu Fri Sat   New Dose 5 mg 2.5 mg 5 mg 5 mg 2.5 mg 5 mg 5 mg       ANTICOAG SUMMARY: Anticoagulation Episode Summary    Current INR goal 2.0-3.0  Next INR check 08/31/2015  INR from last check 2.60 (08/17/2015)  Weekly max dose   Target end date Indefinite  [facility]  Preferred lab   Send INR reminders to    Indications  Pulmonary embolism ([place]) [I26.99]        Comments         ANTICOAG TODAY: Anticoagulation Summary as of 08/17/2015    INR goal 2.0-3.0  Selected INR 2.60 (08/17/2015)  Next INR check 08/31/2015  Target end date Indefinite   Indications  Pulmonary embolism ([place]) [I26.99]      Anticoagulation Episode Summary    [facility]   Preferred lab    Send INR reminders to    Comments       PATIENT INSTRUCTIONS: Patient Instructions  Patient instructed to take medications as defined in the Anti-coagulation Track section of this encounter.  Patient instructed to take today's dose.  Patient verbalized understanding of these instructions.       FOLLOW-UP Return in 2 weeks (on 08/31/2015) for Follow up INR at 1015h.  [name], III Pharm.D., CACP

## 2015-08-17 NOTE — Telephone Encounter (Signed)
Pt requesting the nurse to call back regarding letter for school. Please call pt back.

## 2015-08-17 NOTE — Patient Instructions (Signed)
1. Please make a follow up appointment for 3 months.   Continue to have your INR followed as previously directed.   2. Please take all medications as previously prescribed.  3. If you have worsening of your symptoms or new symptoms arise, please call the clinic [phone]), or go to the ER immediately if symptoms are severe.  You have done a great job in taking all your medications. Please continue to do this.

## 2015-08-17 NOTE — Progress Notes (Signed)
Subjective:   [name]   DOB: [birth year] 60 y.o.   MRN: [id number]  HPI: [name] is a 60 y.o. male w/ PMHx of CVA (2006), h/o PE (2006), chronic low back pain, OA, and chronic constipation, presents to the clinic today for a follow-up visit regarding his Driver's license. He has a h/o CVA in 2006 and is required to have DMV paperwork filled out by a physician in order to ensure that he is safe to drive. Per the patient, his functional status is the same, he continues to walk with a cane, has left-sided hemiparesis, but is able to continue to ambulate. He is independent with regards to his ADL's and IADL's. He complains of pain in his left knee doe to hyperextension, according to the patient and has previous imaging showing mild degenerative changes. He is otherwise doing well. He did have to withdraw from a night class because his knee pain and relative disability was making it difficult for him to concentrate effectively and complete his work.   Past Medical History  Diagnosis Date  . Embolic stroke involving right middle cerebral artery ([place]) 08/08/2004    Occured after traveling from [place] to [place] where he was hospitalized for 1 month with no further work-up or therapy.  Returned to [place] unable to walk and was admitted to [facility] immediately after landing. Presumed embolic per neuro but TEE, bubble study, and hypercoag W/U negative. On indefinite coumadin per patient's informed preference.  Residual effects : Left spastic hemiplegia. Tx with phenol tibi  . Pulmonary embolism ([place]) 09/30/2004    Diagnosed in April 2006 after returning from [place].  Likely provoked as it occurred after a 12 hour plane flight within 2 months of R MCA CVA with left hemiparesis. Patient has made an informed decision to remain on lifelong warfarin.   . Chronic low back pain 06/12/2013    L4-5 facet arthropathy   . Osteoarthritis of both knees 01/26/2012  . Chronic pain of both  shoulders 12/07/2013    A/C joint osteoarthritis   . Obesity (BMI 30.0-34.9) 12/07/2013  . Chronic constipation 06/12/2013  . Positive PPD 12/07/2013  . Cluster headaches     Resolved in 2006  . Hyperplastic colon polyp 07/27/2012    Excised endoscopically 07/27/2012   Current Outpatient Prescriptions  Medication Sig Dispense Refill  . aspirin EC 81 MG tablet Take 81 mg by mouth daily.    Marland Kitchen atorvastatin (LIPITOR) 10 MG tablet Take 1 tablet (10 mg total) by mouth daily. 90 tablet 3  . diclofenac sodium (VOLTAREN) 1 % GEL Apply 2 g topically 4 (four) times daily. 100 g 2  . guaiFENesin-codeine 100-10 MG/5ML syrup Take 5 mLs by mouth 3 (three) times daily as needed for cough. 120 mL 1  . Multiple Vitamin (MULTIVITAMIN) tablet Take 1 tablet by mouth daily.    . traMADol (ULTRAM) 50 MG tablet Take 1 tablet (50 mg total) by mouth every 6 (six) hours as needed for severe pain. (Patient not taking: Reported on 08/03/2015) 60 tablet 0  . warfarin (COUMADIN) 5 MG tablet TAKE ONE TABLET BY MOUTH ONCE DAILY EXCEPT TAKE 0.5 TABLETS BY MOUTH ON SUNDAYS. Dispense 90 day supply. 84 tablet 3   No current facility-administered medications for this visit.    Review of Systems: General: Denies fever, chills, diaphoresis, appetite change and fatigue.  Respiratory: Denies SOB, DOE, cough, and wheezing.   Cardiovascular: Denies chest pain and palpitations.  Gastrointestinal: Denies  nausea, vomiting, abdominal pain, and diarrhea.  Genitourinary: Denies dysuria, increased frequency, and flank pain. Endocrine: Denies hot or cold intolerance, polyuria, and polydipsia. Musculoskeletal: Positive for gait problem and left knee pain. Denies myalgias, back pain, joint swelling. Skin: Denies pallor, rash and wounds.  Neurological: Positive for chronic left-sided weakness. Denies dizziness, seizures, syncope,  lightheadedness, numbness and headaches.  Psychiatric/Behavioral: Denies mood changes, and sleep  disturbances.  Objective:   Physical Exam: Filed Vitals:   08/17/15 1525  BP: 136/80  Pulse: 74  Temp: 98.1 F (36.7 C)  TempSrc: Oral  Height: 6' (1.829 m)  Weight: 226 lb 12.8 oz (102.876 kg)  SpO2: 100%    General: Alert, cooperative, NAD. HEENT: PERRL, EOMI. Moist mucus membranes Neck: Full range of motion without pain, supple, no lymphadenopathy or carotid bruits Lungs: Clear to ascultation bilaterally, normal work of respiration, no wheezes, rales, rhonchi Heart: RRR, no murmurs, gallops, or rubs Abdomen: Soft, non-tender, non-distended, BS + Extremities: No cyanosis, clubbing, or edema. Left leg with brace, LUE with decreased ROM.  Neurologic: Alert & oriented x3, cranial nerves II-XII intact, sensation intact to light touch. 3/5 strength in LUE, 4/5 strength in LLE.    Assessment & Plan:   Please see problem based assessment and plan.

## 2015-08-17 NOTE — Telephone Encounter (Signed)
Being seen today 

## 2015-08-17 NOTE — Patient Instructions (Signed)
Patient instructed to take medications as defined in the Anti-coagulation Track section of this encounter.  Patient instructed to take today's dose.  Patient verbalized understanding of these instructions.    

## 2015-08-18 NOTE — Assessment & Plan Note (Signed)
Patient still complaining of knee pain, mainly in his left knee. Says he has issues with hyperextension because of his previous stroke. Walks with a leg brace, is supposed to be getting a knee brace as well. Pain relatively well controlled with Tramadol and Voltaren gel prn.

## 2015-08-18 NOTE — Progress Notes (Signed)
Internal Medicine Clinic Attending  Case discussed with Dr. Jones at the time of the visit.  We reviewed the resident's history and exam and pertinent patient test results.  I agree with the assessment, diagnosis, and plan of care documented in the resident's note.  

## 2015-08-18 NOTE — Assessment & Plan Note (Signed)
Patient continues to be independent with ADL's, IADL's. Continues to drive and feel this is reasonable given his weakness on his left side. Still walks with a cane, has good functional status. Filled out paperwork for DMV saying it is okay for patient to continue to operate a vehicle based on his physical exam and reported functional status.

## 2015-08-19 NOTE — Progress Notes (Signed)
INTERNAL MEDICINE TEACHING ATTENDING ADDENDUM - Avagrace Botelho M.D  Duration- indefinite, Indication- PE, INR- therapeutic. Agree with pharmacy recommendations as outlined in their note.     

## 2015-08-20 DIAGNOSIS — R310 Gross hematuria: Secondary | ICD-10-CM | POA: Diagnosis not present

## 2015-08-20 DIAGNOSIS — N201 Calculus of ureter: Secondary | ICD-10-CM | POA: Diagnosis not present

## 2015-08-21 ENCOUNTER — Encounter: Payer: Self-pay | Admitting: Internal Medicine

## 2015-08-21 DIAGNOSIS — N400 Enlarged prostate without lower urinary tract symptoms: Secondary | ICD-10-CM | POA: Insufficient documentation

## 2015-08-24 ENCOUNTER — Ambulatory Visit: Payer: Commercial Managed Care - HMO

## 2015-08-27 DIAGNOSIS — Z0000 Encounter for general adult medical examination without abnormal findings: Secondary | ICD-10-CM | POA: Diagnosis not present

## 2015-08-27 DIAGNOSIS — R310 Gross hematuria: Secondary | ICD-10-CM | POA: Diagnosis not present

## 2015-08-27 DIAGNOSIS — N1330 Unspecified hydronephrosis: Secondary | ICD-10-CM | POA: Diagnosis not present

## 2015-08-27 DIAGNOSIS — N201 Calculus of ureter: Secondary | ICD-10-CM | POA: Diagnosis not present

## 2015-08-27 DIAGNOSIS — N3941 Urge incontinence: Secondary | ICD-10-CM | POA: Diagnosis not present

## 2015-08-31 ENCOUNTER — Ambulatory Visit (HOSPITAL_COMMUNITY)
Admission: RE | Admit: 2015-08-31 | Discharge: 2015-08-31 | Disposition: A | Discharge status: Home / Self Care | Payer: Commercial Managed Care - HMO | Source: Ambulatory Visit | Attending: Internal Medicine | Admitting: Internal Medicine

## 2015-08-31 ENCOUNTER — Other Ambulatory Visit: Payer: Self-pay | Admitting: Internal Medicine

## 2015-08-31 ENCOUNTER — Ambulatory Visit: Payer: Commercial Managed Care - HMO | Admitting: *Deleted

## 2015-08-31 ENCOUNTER — Ambulatory Visit: Payer: Commercial Managed Care - HMO

## 2015-08-31 ENCOUNTER — Encounter: Payer: Self-pay | Admitting: Internal Medicine

## 2015-08-31 ENCOUNTER — Ambulatory Visit (INDEPENDENT_AMBULATORY_CARE_PROVIDER_SITE_OTHER): Payer: Commercial Managed Care - HMO | Admitting: Pharmacist

## 2015-08-31 ENCOUNTER — Other Ambulatory Visit: Payer: Self-pay | Admitting: Urology

## 2015-08-31 DIAGNOSIS — I63411 Cerebral infarction due to embolism of right middle cerebral artery: Secondary | ICD-10-CM | POA: Insufficient documentation

## 2015-08-31 DIAGNOSIS — I498 Other specified cardiac arrhythmias: Secondary | ICD-10-CM | POA: Diagnosis not present

## 2015-08-31 DIAGNOSIS — I2699 Other pulmonary embolism without acute cor pulmonale: Secondary | ICD-10-CM | POA: Diagnosis not present

## 2015-08-31 DIAGNOSIS — I440 Atrioventricular block, first degree: Secondary | ICD-10-CM | POA: Diagnosis not present

## 2015-08-31 DIAGNOSIS — N3941 Urge incontinence: Secondary | ICD-10-CM

## 2015-08-31 DIAGNOSIS — Z7901 Long term (current) use of anticoagulants: Secondary | ICD-10-CM

## 2015-08-31 DIAGNOSIS — N200 Calculus of kidney: Secondary | ICD-10-CM | POA: Insufficient documentation

## 2015-08-31 HISTORY — DX: Calculus of kidney: N20.0

## 2015-08-31 HISTORY — DX: Urge incontinence: N39.41

## 2015-08-31 LAB — POCT INR: INR: 2.7

## 2015-08-31 NOTE — Progress Notes (Signed)
Received a request from [facility] to assess perioperative risks for shockwave lithotripsy scheduled for next week.  ECG: NSR at 73 bpm, normal axis, 1st degree AV block, no significant Q waves, no LVH by voltage, good R wave progression, 1 mm early repolarization in V1 and V2, no T wave changes.  No comparisons available.  Clinical criteria: Minor (h/o stroke) Functional capacity: Moderate Surgical procedure risk: Low  Per ACC/AHA algorithm Alexander English can proceed to the OR.  He can discontinue the ASA and Coumadin up to 5 days prior to the procedure and should restart them when it is safe to from a surgical standpoint.  No other precautions or specific instructions.

## 2015-08-31 NOTE — Progress Notes (Signed)
No bridging is necessary.  I messaged Urology that they could restart the ASA/coumadin when it was felt to be safe from a surgical perspective.

## 2015-08-31 NOTE — Progress Notes (Addendum)
Anti-Coagulation Progress Note  Alexander English is a 60 y.o. male who is currently on an anti-coagulation regimen.    RECENT RESULTS: Recent results are below, the most recent result is correlated with a dose of 30 mg. per week:  He will take his regularly scheduled doses today, tomorrow and Wednesday. On Thursday 9-MAR-17 he will OMIT/HOLD warfarin for FIVE (5) days of which the patient is aware and has been provided from www.doseresponse.com written instructions reflecting this. Discussed with Dr. Evette Doffing (attending this afternoon) and he wishes me to discuss case with Dr. Eppie Gibson to determine requirement/necessity of bridge therapy with LMWH. Patient had two embolic events in April [id number] attributed to a provoking cause of long distance air travel (12 h duration [one-way]). These embolic events documented in his problem list. Will discuss with Dr. Eppie Gibson and if determined necessary, I will bridge the patient with LMWH commencing 36 hours after discontinuing the warfarin therapy. ADDENDUM at 5:28PM:  Discussed with Dr. Eppie Gibson at 5:25PM in his office. He indicates that no bridging is necessary for this patient.  Lab Results  Component Value Date   INR 2.70 08/31/2015   INR 2.60 08/17/2015   INR 3.20 08/03/2015    ANTI-COAG DOSE: Anticoagulation Dose Instructions as of 08/31/2015      [name] Tue Wed Thu Fri Sat   New Dose 5 mg 2.5 mg 5 mg 5 mg Hold Hold Hold    Description        Will commence HOLD of warfarin on Thursday 9-MAR-17 that will be for 5 days--through his planned intervention day/date of Monday 13-MAR-17 at which time he states he is to have a laser procedure for kidney stone removal.       ANTICOAG SUMMARY: Anticoagulation Episode Summary    Current INR goal 2.0-3.0  Next INR check 09/14/2015  INR from last check 2.70 (08/31/2015)  Weekly max dose   Target end date Indefinite  [facility]  Preferred lab   Send INR reminders to    Indications  Pulmonary  embolism ([place]) [I26.99]        Comments         ANTICOAG TODAY: Anticoagulation Summary as of 08/31/2015    INR goal 2.0-3.0  Selected INR 2.70 (08/31/2015)  Next INR check 09/14/2015  Target end date Indefinite   Indications  Pulmonary embolism ([place]) [I26.99]      Anticoagulation Episode Summary    [facility]   Preferred lab    Send INR reminders to    Comments       PATIENT INSTRUCTIONS: Patient Instructions  Patient instructed to take medications as defined in the Anti-coagulation Track section of this encounter.  Patient instructed to take today's dose.  Patient will HOLD/OMIT warfarin beginning Thursday 9-MAR-17 and HOLD/OMIT for 5 days--to include Monday 13-MAR-17 his date of planned surgical procedure by [facility].  Patient verbalized understanding of these instructions.       FOLLOW-UP Return in 2 weeks (on 09/14/2015) for Follow up INR at 0930h.  [name], III Pharm.D., CACP

## 2015-08-31 NOTE — Patient Instructions (Signed)
Patient instructed to take medications as defined in the Anti-coagulation Track section of this encounter.  Patient instructed to take today's dose.  Patient will HOLD/OMIT warfarin beginning Thursday 9-MAR-17 and HOLD/OMIT for 5 days--to include Monday 13-MAR-17 his date of planned surgical procedure by [facility].  Patient verbalized understanding of these instructions.

## 2015-09-02 ENCOUNTER — Telehealth: Payer: Self-pay | Admitting: Internal Medicine

## 2015-09-02 NOTE — Telephone Encounter (Signed)
Dr. Maudie Mercury is this something you can help with?  Patient requesting letter from [place] documenting his adherence required monthly INR checks.

## 2015-09-02 NOTE — Telephone Encounter (Signed)
Pt Needs a letter states that he has been coming once a month to get his INR checked. Since summer last year

## 2015-09-03 NOTE — Telephone Encounter (Signed)
Thanks Dr. Maudie Mercury, I called to inform patient.  He is appreciative

## 2015-09-03 NOTE — Telephone Encounter (Signed)
I typed the letter for patient to pick up. Will notify front desk. Thank you!

## 2015-09-04 ENCOUNTER — Encounter (HOSPITAL_COMMUNITY): Payer: Self-pay | Admitting: General Practice

## 2015-09-04 NOTE — Progress Notes (Addendum)
INTERNAL MEDICINE TEACHING ATTENDING ADDENDUM - Aldine Contes M.D  Duration- indefinite, Indication- PE, INR- therapeutic. Agree with pharmacy recommendations as outlined in their note.  Patient is scheduled for laser stone removal and coumadin is on hold for procedure. I agree with Dr. Caroline More recommendation as well. No bridging is necessary

## 2015-09-07 ENCOUNTER — Ambulatory Visit (HOSPITAL_COMMUNITY)
Admission: RE | Admit: 2015-09-07 | Discharge: 2015-09-07 | Disposition: A | Discharge status: Home / Self Care | Payer: Commercial Managed Care - HMO | Source: Ambulatory Visit | Attending: Urology | Admitting: Urology

## 2015-09-07 ENCOUNTER — Encounter (HOSPITAL_COMMUNITY)
Admission: RE | Disposition: A | Discharge status: Home / Self Care | Payer: Self-pay | Source: Ambulatory Visit | Attending: Urology

## 2015-09-07 ENCOUNTER — Ambulatory Visit (HOSPITAL_COMMUNITY): Payer: Commercial Managed Care - HMO

## 2015-09-07 ENCOUNTER — Encounter (HOSPITAL_COMMUNITY): Payer: Self-pay | Admitting: General Practice

## 2015-09-07 DIAGNOSIS — M19012 Primary osteoarthritis, left shoulder: Secondary | ICD-10-CM | POA: Diagnosis not present

## 2015-09-07 DIAGNOSIS — Z87891 Personal history of nicotine dependence: Secondary | ICD-10-CM | POA: Insufficient documentation

## 2015-09-07 DIAGNOSIS — I10 Essential (primary) hypertension: Secondary | ICD-10-CM | POA: Insufficient documentation

## 2015-09-07 DIAGNOSIS — N132 Hydronephrosis with renal and ureteral calculous obstruction: Secondary | ICD-10-CM | POA: Diagnosis not present

## 2015-09-07 DIAGNOSIS — M170 Bilateral primary osteoarthritis of knee: Secondary | ICD-10-CM | POA: Diagnosis not present

## 2015-09-07 DIAGNOSIS — N201 Calculus of ureter: Secondary | ICD-10-CM

## 2015-09-07 DIAGNOSIS — M19011 Primary osteoarthritis, right shoulder: Secondary | ICD-10-CM | POA: Insufficient documentation

## 2015-09-07 DIAGNOSIS — Z86711 Personal history of pulmonary embolism: Secondary | ICD-10-CM | POA: Diagnosis not present

## 2015-09-07 DIAGNOSIS — Z7901 Long term (current) use of anticoagulants: Secondary | ICD-10-CM | POA: Diagnosis not present

## 2015-09-07 DIAGNOSIS — Z8673 Personal history of transient ischemic attack (TIA), and cerebral infarction without residual deficits: Secondary | ICD-10-CM | POA: Diagnosis not present

## 2015-09-07 LAB — PROTIME-INR
INR: 1.17 (ref 0.00–1.49)
Prothrombin Time: 15 seconds (ref 11.6–15.2)

## 2015-09-07 SURGERY — LITHOTRIPSY, ESWL
Anesthesia: LOCAL | Laterality: Left

## 2015-09-07 MED ORDER — SODIUM CHLORIDE 0.9 % IV SOLN
INTRAVENOUS | Status: DC
  Administered 2015-09-07: 08:00:00 via INTRAVENOUS

## 2015-09-07 MED ORDER — DIAZEPAM 5 MG PO TABS
10.0000 mg | ORAL_TABLET | ORAL | Status: AC
  Administered 2015-09-07: 10 mg via ORAL
  Filled 2015-09-07: qty 2

## 2015-09-07 MED ORDER — SENNOSIDES-DOCUSATE SODIUM 8.6-50 MG PO TABS: 1.0000 | ORAL_TABLET | Freq: Two times a day (BID) | ORAL | Status: DC

## 2015-09-07 MED ORDER — DIPHENHYDRAMINE HCL 25 MG PO CAPS
25.0000 mg | ORAL_CAPSULE | ORAL | Status: AC
  Administered 2015-09-07: 25 mg via ORAL
  Filled 2015-09-07: qty 1

## 2015-09-07 MED ORDER — CIPROFLOXACIN HCL 500 MG PO TABS
500.0000 mg | ORAL_TABLET | ORAL | Status: AC
  Administered 2015-09-07: 500 mg via ORAL
  Filled 2015-09-07: qty 1

## 2015-09-07 MED ORDER — OXYCODONE-ACETAMINOPHEN 5-325 MG PO TABS: 1.0000 | ORAL_TABLET | ORAL | Status: DC | PRN

## 2015-09-07 MED ORDER — ONDANSETRON HCL 4 MG PO TABS: 4.0000 mg | ORAL_TABLET | Freq: Three times a day (TID) | ORAL | Status: DC | PRN

## 2015-09-07 NOTE — H&P (Signed)
Alexander English is an 60 y.o. male.    Chief Complaint: Pre-OP Left Shockwave Lithotripsy  HPI:   1 - Left Ureteral Stone - Left prox ureteral stone by CT on eval hematuria 08/2015. Stone is 82mm, SSD 13cm, 440 HU and at L3-4 interspace just medial to lower pole. Most recent UA withtout infectious parameters.  Today Alexander English is seen to proceed with left shockwave lithotripsy. He has held coumadin as instructed.   Past Medical History  Diagnosis Date  . Embolic stroke involving right middle cerebral artery ([place]) 08/08/2004    Occured after traveling from [place] to [place] where he was hospitalized for 1 month with no further work-up or therapy.  Returned to [place] unable to walk and was admitted to [facility] immediately after landing. Presumed embolic per neuro but TEE, bubble study, and hypercoag W/U negative. On indefinite coumadin per patient's informed preference.  Residual effects : Left spastic hemiplegia. Tx with phenol tibi  . Pulmonary embolism ([place]) 09/30/2004    Diagnosed in April 2006 after returning from [place].  Likely provoked as it occurred after a 12 hour plane flight within 2 months of R MCA CVA with left hemiparesis. Patient has made an informed decision to remain on lifelong warfarin.   . Chronic low back pain 06/12/2013    L4-5 facet arthropathy   . Osteoarthritis of both knees 01/26/2012  . Chronic pain of both shoulders 12/07/2013    A/C joint osteoarthritis   . Obesity (BMI 30.0-34.9) 12/07/2013  . Chronic constipation 06/12/2013  . Positive PPD 12/07/2013  . Cluster headaches     Resolved in 2006  . Hyperplastic colon polyp 07/27/2012    Excised endoscopically 07/27/2012  . Left nephrolithiasis 08/31/2015    With mild left hydronephrosis.  Scheduled to undergo shockwave lithotripsy.  . Urge incontinence of urine 08/31/2015    Possibly secondary to prior CVA.  Treated with Myrbetriq.    Past Surgical History  Procedure Laterality Date  . Foot surgery Bilateral     Bunion  . I&d  extremity Left 11/28/2001    Left thumb thenar space abscess.    Family History  Problem Relation Age of Onset  . Stroke Maternal Grandmother   . Unexplained death Mother   . Depression Mother     After husband's death  . Unexplained death Father   . Osteoarthritis Father     Knees  . Early death Sister   . Seizures Sister   . Early death Brother 30    Unknown cause  . Healthy Daughter   . Healthy Son   . Stroke Maternal Aunt   . Healthy Sister   . Healthy Sister   . Unexplained death Brother   . Healthy Brother   . Healthy Son    Social History:  reports that he quit smoking about 8 years ago. He has never used smokeless tobacco. He reports that he does not drink alcohol or use illicit drugs.  Allergies: No Known Allergies  No prescriptions prior to admission    No results found for this or any previous visit (from the past 48 hour(s)). No results found.  Review of Systems  Constitutional: Negative.  Negative for fever and chills.  HENT: Negative.   Eyes: Negative.   Respiratory: Negative.   Cardiovascular: Negative.   Gastrointestinal: Negative.   Genitourinary: Positive for hematuria.  Musculoskeletal: Negative.   Skin: Negative.   Neurological: Negative.   Endo/Heme/Allergies: Negative.   Psychiatric/Behavioral: Negative.     Height 6' (  1.829 m), weight 102.513 kg (226 lb). Physical Exam  Constitutional: He appears well-developed.  HENT:  Head: Normocephalic.  Eyes: Pupils are equal, round, and reactive to light.  Neck: Normal range of motion.  Cardiovascular: Normal rate.   Respiratory: Effort normal.  GI: Soft.  Genitourinary:  NO CVAT at present  Neurological: He is alert.  Skin: Skin is warm.  Psychiatric: He has a normal mood and affect. His behavior is normal. Judgment and thought content normal.     Assessment/Plan  1 - Left Ureteral Stone - We discussed shockwave lithotripsy in detail as well as my "rule of 9s" with stones <31mm, less  than 900 HU, and skin to stone distance <9cm having approximately 90% treatment success with single session of treatment. We then addressed how stones that are larger, more dense, and in patients with less favorable anatomy have incrementally decreased success rates. We discussed risks including, bleeding, infection, hematoma, loss of kidney, need for staged therapy, need for adjunctive therapy and requirement to refrain from any anticoagulants, anti-platelet or aspirin-like products peri-procedureally.   After careful consideration, the patient has chosen to proceed today as planned.    Alexis Frock, MD 09/07/2015, 6:02 AM

## 2015-09-07 NOTE — Discharge Instructions (Signed)
1 - You may have urinary urgency (bladder spasms), bloody urine on / off, and pass small stone fragments for few weeks. This is normal.  2 - Call MD or go to ER for fever >102, severe pain / nausea / vomiting not relieved by medications, or acute change in medical status

## 2015-09-14 ENCOUNTER — Ambulatory Visit (INDEPENDENT_AMBULATORY_CARE_PROVIDER_SITE_OTHER): Payer: Commercial Managed Care - HMO | Admitting: Pharmacist

## 2015-09-14 ENCOUNTER — Ambulatory Visit: Payer: Commercial Managed Care - HMO

## 2015-09-14 DIAGNOSIS — Z7901 Long term (current) use of anticoagulants: Secondary | ICD-10-CM

## 2015-09-14 DIAGNOSIS — I2699 Other pulmonary embolism without acute cor pulmonale: Secondary | ICD-10-CM

## 2015-09-14 LAB — POCT INR: INR: 2

## 2015-09-14 NOTE — Patient Instructions (Signed)
Patient instructed to take medications as defined in the Anti-coagulation Track section of this encounter.  Patient instructed to take today's dose.  Patient verbalized understanding of these instructions.    

## 2015-09-14 NOTE — Progress Notes (Signed)
Anti-Coagulation Progress Note  Alexander English is a 60 y.o. male who is currently on an anti-coagulation regimen.    RECENT RESULTS: Recent results are below, the most recent result is correlated with a dose of 17.5 mg in 7 days reflecting the omitted doses prior to his kidney stone removal procedure. Resumed warfarin on Thursday 16-MAR-17. Lab Results  Component Value Date   INR 2.00 09/14/2015   INR 1.17 09/07/2015   INR 2.70 08/31/2015    ANTI-COAG DOSE: Anticoagulation Dose Instructions as of 09/14/2015      [name] Tue Wed Thu Fri Sat   New Dose 5 mg 2.5 mg 5 mg 5 mg 2.5 mg 5 mg 5 mg       ANTICOAG SUMMARY: Anticoagulation Episode Summary    Current INR goal 2.0-3.0  Next INR check 10/05/2015  INR from last check 2.00 (09/14/2015)  Weekly max dose   Target end date Indefinite  [facility] check [facility]  Preferred lab   Send INR reminders to    Indications  Pulmonary embolism ([place]) [I26.99]        Comments         ANTICOAG TODAY: Anticoagulation Summary as of 09/14/2015    INR goal 2.0-3.0  Selected INR 2.00 (09/14/2015)  Next INR check 10/05/2015  Target end date Indefinite   Indications  Pulmonary embolism ([place]) [I26.99]      Anticoagulation Episode Summary    INR check location [facility]   Preferred lab    Send INR reminders to    Comments       PATIENT INSTRUCTIONS: Patient Instructions  Patient instructed to take medications as defined in the Anti-coagulation Track section of this encounter.  Patient instructed to take today's dose.  Patient verbalized understanding of these instructions.       FOLLOW-UP Return in about 3 weeks (around 10/05/2015) for Follow up INR at 0930h.  [name], III Pharm.D., CACP

## 2015-09-22 DIAGNOSIS — Z0000 Encounter for general adult medical examination without abnormal findings: Secondary | ICD-10-CM | POA: Diagnosis not present

## 2015-09-22 DIAGNOSIS — N201 Calculus of ureter: Secondary | ICD-10-CM | POA: Diagnosis not present

## 2015-10-02 ENCOUNTER — Other Ambulatory Visit: Payer: Self-pay | Admitting: Internal Medicine

## 2015-10-02 DIAGNOSIS — I2699 Other pulmonary embolism without acute cor pulmonale: Secondary | ICD-10-CM

## 2015-10-05 ENCOUNTER — Ambulatory Visit (INDEPENDENT_AMBULATORY_CARE_PROVIDER_SITE_OTHER): Payer: Commercial Managed Care - HMO | Admitting: Pharmacist

## 2015-10-05 DIAGNOSIS — I2699 Other pulmonary embolism without acute cor pulmonale: Secondary | ICD-10-CM | POA: Diagnosis not present

## 2015-10-05 DIAGNOSIS — Z86711 Personal history of pulmonary embolism: Secondary | ICD-10-CM

## 2015-10-05 DIAGNOSIS — Z7901 Long term (current) use of anticoagulants: Secondary | ICD-10-CM

## 2015-10-05 LAB — POCT INR: INR: 2.1

## 2015-10-05 MED ORDER — WARFARIN SODIUM 5 MG PO TABS: ORAL_TABLET | ORAL | Status: DC

## 2015-10-05 NOTE — Progress Notes (Signed)
Anti-Coagulation Progress Note  Alexander English is a 60 y.o. male who is currently on an anti-coagulation regimen.    RECENT RESULTS: Recent results are below, the most recent result is correlated with a dose of 30 mg. per week: Lab Results  Component Value Date   INR 2.10 10/05/2015   INR 2.00 09/14/2015   INR 1.17 09/07/2015    ANTI-COAG DOSE: Anticoagulation Dose Instructions as of 10/05/2015      [name] Tue Wed Thu Fri Sat   New Dose 5 mg 5 mg 5 mg 5 mg 2.5 mg 5 mg 5 mg       ANTICOAG SUMMARY: Anticoagulation Episode Summary    Current INR goal 2.0-3.0  Next INR check 10/26/2015  INR from last check 2.10 (10/05/2015)  Weekly max dose   Target end date Indefinite  [facility]  Preferred lab   Send INR reminders to    Indications  Pulmonary embolism ([place]) [I26.99]        Comments         ANTICOAG TODAY: Anticoagulation Summary as of 10/05/2015    INR goal 2.0-3.0  Selected INR 2.10 (10/05/2015)  Next INR check 10/26/2015  Target end date Indefinite   Indications  Pulmonary embolism ([place]) [I26.99]      Anticoagulation Episode Summary    INR check [facility]   Preferred lab    Send INR reminders to    Comments       PATIENT INSTRUCTIONS: Patient Instructions  Patient instructed to take medications as defined in the Anti-coagulation Track section of this encounter.  Patient instructed to take today's dose.  Patient verbalized understanding of these instructions.       FOLLOW-UP Return in 3 weeks (on 10/26/2015) for Follow up INR at 0915h.  [name], III Pharm.D., CACP

## 2015-10-05 NOTE — Patient Instructions (Signed)
Patient instructed to take medications as defined in the Anti-coagulation Track section of this encounter.  Patient instructed to take today's dose.  Patient verbalized understanding of these instructions.    

## 2015-10-06 NOTE — Progress Notes (Signed)
Indication: Venous thromboembolism. Duration: Indefinite per patient's informed preference. INR: At target. Agree with Dr. Gladstone Pih assessment and plan.

## 2015-10-22 ENCOUNTER — Telehealth: Payer: Self-pay | Admitting: Internal Medicine

## 2015-10-22 NOTE — Telephone Encounter (Signed)
APPT. REMINDER CALL, LMTCB °

## 2015-10-23 ENCOUNTER — Ambulatory Visit (INDEPENDENT_AMBULATORY_CARE_PROVIDER_SITE_OTHER): Payer: Commercial Managed Care - HMO | Admitting: Internal Medicine

## 2015-10-23 ENCOUNTER — Encounter: Payer: Self-pay | Admitting: Internal Medicine

## 2015-10-23 VITALS — BP 127/81 | HR 78 | Temp 97.8°F | Wt 219.5 lb

## 2015-10-23 DIAGNOSIS — I69393 Ataxia following cerebral infarction: Secondary | ICD-10-CM | POA: Diagnosis not present

## 2015-10-23 DIAGNOSIS — I69354 Hemiplegia and hemiparesis following cerebral infarction affecting left non-dominant side: Secondary | ICD-10-CM

## 2015-10-23 DIAGNOSIS — I6930 Unspecified sequelae of cerebral infarction: Secondary | ICD-10-CM

## 2015-10-23 DIAGNOSIS — N3941 Urge incontinence: Secondary | ICD-10-CM

## 2015-10-23 DIAGNOSIS — B351 Tinea unguium: Secondary | ICD-10-CM

## 2015-10-23 DIAGNOSIS — N200 Calculus of kidney: Secondary | ICD-10-CM

## 2015-10-23 DIAGNOSIS — Z87442 Personal history of urinary calculi: Secondary | ICD-10-CM

## 2015-10-23 DIAGNOSIS — Z7901 Long term (current) use of anticoagulants: Secondary | ICD-10-CM

## 2015-10-23 DIAGNOSIS — M174 Other bilateral secondary osteoarthritis of knee: Secondary | ICD-10-CM | POA: Diagnosis not present

## 2015-10-23 DIAGNOSIS — Z09 Encounter for follow-up examination after completed treatment for conditions other than malignant neoplasm: Secondary | ICD-10-CM

## 2015-10-23 DIAGNOSIS — M545 Low back pain: Secondary | ICD-10-CM

## 2015-10-23 DIAGNOSIS — I2782 Chronic pulmonary embolism: Secondary | ICD-10-CM

## 2015-10-23 DIAGNOSIS — I2699 Other pulmonary embolism without acute cor pulmonale: Secondary | ICD-10-CM | POA: Diagnosis not present

## 2015-10-23 DIAGNOSIS — G8929 Other chronic pain: Secondary | ICD-10-CM

## 2015-10-23 LAB — POCT INR: INR: 2.2

## 2015-10-23 MED ORDER — ZOSTER VACCINE LIVE 19400 UNT/0.65ML ~~LOC~~ SUSR: 0.6500 mL | Freq: Once | SUBCUTANEOUS | Status: DC

## 2015-10-23 NOTE — Assessment & Plan Note (Signed)
Assessment  He has had no further hematuria after his nephrolithiasis was treated with ESWL.  Plan  He will continue to follow the instructions provided to him by Urology on ways to avoid recurrent nephrolithiasis. We will reassess for hematuria at the follow-up visit.

## 2015-10-23 NOTE — Assessment & Plan Note (Signed)
Assessment  After referral to a podiatrist and trimming of his onychomycotic left great toenail his symptoms of pain have resolved.  Plan  We will reassess for symptoms of toe pain related to his nails at the follow-up visit.

## 2015-10-23 NOTE — Progress Notes (Signed)
   Subjective:    Patient ID: Alexander English, male    DOB: August 20, 1955, 60 y.o.   MRN: [id number]  HPI  Alexander English is here for follow-up of his knee osteoarthritis and history of venous thromboembolism and embolic stroke with residual left sided weakness. Please see the A&P for the status of the pt's chronic medical problems.  Review of Systems  Constitutional: Negative for activity change and unexpected weight change.  Cardiovascular: Negative for leg swelling.  Musculoskeletal: Positive for back pain, arthralgias and gait problem. Negative for joint swelling.  Skin: Negative for rash and wound.  Neurological: Positive for weakness.       Chronic left sided weakness      Objective:   Physical Exam  Constitutional: He is oriented to person, place, and time. He appears well-developed and well-nourished. No distress.  HENT:  Head: Normocephalic and atraumatic.  Cardiovascular: Normal rate, regular rhythm and normal heart sounds.  Exam reveals no gallop and no friction rub.   No murmur heard. Pulmonary/Chest: Effort normal and breath sounds normal. No respiratory distress. He has no wheezes. He has no rales.  Musculoskeletal: He exhibits no edema or tenderness.  Hyperextension of left knee with antalgic gait  Neurological: He is alert and oriented to person, place, and time. Coordination abnormal.  Residual left upper and lower extremity weakness.  Skin: Skin is warm and dry. No rash noted. He is not diaphoretic. No erythema.  Psychiatric: He has a normal mood and affect. His behavior is normal. Judgment and thought content normal.  Nursing note and vitals reviewed.     Assessment & Plan:   Please see problem oriented charting.

## 2015-10-23 NOTE — Assessment & Plan Note (Signed)
Assessment  He has had no overt clinical signs or symptoms consistent with a recurrent pulmonary embolism while on the warfarin therapy. He is followed very closely in the anticoagulation clinic. He has decided to remain on warfarin indefinitely so as not to risk having another episode of venous thromboembolism.  Plan  We will continue the warfarin as managed by the anticoagulation clinic. An INR was obtained today and was 2.2. We will therefore continue his current Warfarin dosing. Further follow-up will be in the anticoagulation clinic.

## 2015-10-23 NOTE — Patient Instructions (Signed)
It was great to see you again.  I am looking forward to hearing about your trip to [place] when I see you again.  1) Keep taking all of your medications as you are.  2) I will send a referral to the Orthopedic surgeon for an appointment in June to see if we have any other options for your knee.  3) I gave you a prescription for the Zostavax to take to [facility] when you have a chance.  I will see you back in 6 months, sooner if necessary.

## 2015-10-23 NOTE — Assessment & Plan Note (Signed)
Assessment  His chronic low back pain is likely related to his antalgic gait which is a residual of his previous embolic stroke.   Plan  As noted above, we are referring him to orthopedic surgery to see if there is anything that may be done to improve his gait and therefore decrease the mechanical stress on his lower back.

## 2015-10-23 NOTE — Assessment & Plan Note (Signed)
He was provided a written prescription for the Zostavax which he will have filled at [facility]. We will make sure this was administered at the follow-up visit. He is otherwise up-to-date on his health care maintenance.

## 2015-10-23 NOTE — Assessment & Plan Note (Signed)
Assessment  He continues to have significant pain in both knees, but the left is much worse than the right. This has been the case since after his stroke. He has a very antalgic gait related to the left-sided weakness and this is now starting to impact upon other areas of his body including his back and pelvis which are now painful. We have tried various medications and discussed various braces. The medications have only been minimally effective and he is against a larger brace which would be more effective at controlling his hyperextension of the left knee because he would be unable to put it on and off with his concomitant left arm weakness. In the past he is been against considering surgical intervention but with the progression of his pelvic and back pain he is now willing to consider surgical intervention if that may help.  Plan  We will continue the as needed tramadol for the pain. A referral will be placed orthopedic surgery to discuss any surgical options that may be available to assist him with his knee arthritis and hopefully improve his gait to minimize the mechanical disruption and damage impacting upon the pelvis and back.

## 2015-10-23 NOTE — Assessment & Plan Note (Signed)
Assessment  His urge urinary incontinence, likely related to his prior CVA, is improved after starting Myrbetriq.  Plan  We will continue with the Myrbetriq. We will reassess his urge urinary incontinence symptoms at the follow-up visit.

## 2015-10-24 LAB — BMP8+ANION GAP
Anion Gap: 19 mmol/L — ABNORMAL HIGH (ref 10.0–18.0)
BUN/Creatinine Ratio: 11 (ref 10–24)
BUN: 10 mg/dL (ref 8–27)
CO2: 21 mmol/L (ref 18–29)
Calcium: 9.7 mg/dL (ref 8.6–10.2)
Chloride: 102 mmol/L (ref 96–106)
Creatinine, Ser: 0.87 mg/dL (ref 0.76–1.27)
GFR calc Af Amer: 108 mL/min/{1.73_m2} (ref >59)
GFR calc non Af Amer: 94 mL/min/{1.73_m2} (ref >59)
Glucose: 95 mg/dL (ref 65–99)
Potassium: 4.3 mmol/L (ref 3.5–5.2)
Sodium: 142 mmol/L (ref 134–144)

## 2015-10-26 ENCOUNTER — Ambulatory Visit: Payer: Commercial Managed Care - HMO

## 2015-10-26 NOTE — Progress Notes (Signed)
BMP unremarkable with Creatinine of 0.87 and eGFR 108.  Left message that labs "look good" on identified answering machine.

## 2015-12-01 ENCOUNTER — Ambulatory Visit (INDEPENDENT_AMBULATORY_CARE_PROVIDER_SITE_OTHER): Payer: Commercial Managed Care - HMO | Admitting: Pharmacist

## 2015-12-01 DIAGNOSIS — Z7901 Long term (current) use of anticoagulants: Secondary | ICD-10-CM | POA: Diagnosis not present

## 2015-12-01 DIAGNOSIS — I2782 Chronic pulmonary embolism: Secondary | ICD-10-CM

## 2015-12-01 LAB — POCT INR: INR: 2.3

## 2015-12-01 NOTE — Progress Notes (Signed)
Anticoagulation Management Alexander English is a 60 y.o. male who reports to the clinic for monitoring of warfarin treatment.    Indication: PEhistory Duration: indefinite  Anticoagulation Clinic Visit History: Patient does not report signs/symptoms of bleeding or thromboembolism   Anticoagulation Episode Summary    Current INR goal 2.0-3.0  Next INR check 12/21/2015  INR from last check 2.3 (12/01/2015)  Weekly max dose   Target end date Indefinite  INR check location [facility]  Preferred lab   Send INR reminders to    Indications  Pulmonary embolism ([place]) [I26.99]        Comments        ASSESSMENT Recent Results: The most recent result is correlated with 35 mg per week: Lab Results  Component Value Date   INR 2.3 12/01/2015   INR 2.2 10/23/2015   INR 2.10 10/05/2015   Anticoagulation Dosing: INR as of 12/01/2015 and Previous Dosing Information    INR Dt INR Goal Alexander English Sun Mon Tue Wed Thu Fri Sat   12/01/2015 2.3 2.0-3.0 35 mg 5 mg 5 mg 5 mg 5 mg 5 mg 5 mg 5 mg   Patient deviated from recommended dosing.       Anticoagulation Dose Instructions as of 12/01/2015      Total Sun Mon Tue Wed Thu Fri Sat   New Dose 35 mg 5 mg 5 mg 5 mg 5 mg 5 mg 5 mg 5 mg     (5 mg x 1)  (5 mg x 1)  (5 mg x 1)  (5 mg x 1)  (5 mg x 1)  (5 mg x 1)  (5 mg x 1)                         Description        Patient self-increased to 5 mg daily due to travel      INR today: Therapeutic  PLAN Weekly dose was unchanged   Patient Instructions  Patient educated about medication as defined in this encounter and verbalized understanding by repeating back instructions provided.    Patient advised to contact clinic or seek medical attention if signs/symptoms of bleeding or thromboembolism occur.  Patient verbalized understanding by repeating back information and was advised to contact me if further medication-related questions arise. Patient was also provided an information  handout.  Follow-up Return in about 3 weeks (around 12/21/2015) for Follow up INR 12/21/15 at 10am.  Alexander English J  15 minutes spent face-to-face with the patient during the encounter. 50% of time spent on education. 50% of time was spent on assessment and plan.

## 2015-12-01 NOTE — Patient Instructions (Signed)
Patient educated about medication as defined in this encounter and verbalized understanding by repeating back instructions provided.   

## 2015-12-01 NOTE — Progress Notes (Signed)
INTERNAL MEDICINE TEACHING ATTENDING ADDENDUM - Lalla Brothers M.D  Duration- indefinite per patient preference, Indication- PE, INR- therapeutic. Agree with pharmacy recommendations as outlined in their note.

## 2015-12-16 DIAGNOSIS — M21372 Foot drop, left foot: Secondary | ICD-10-CM | POA: Diagnosis not present

## 2015-12-16 DIAGNOSIS — R269 Unspecified abnormalities of gait and mobility: Secondary | ICD-10-CM | POA: Diagnosis not present

## 2015-12-16 DIAGNOSIS — M21862 Other specified acquired deformities of left lower leg: Secondary | ICD-10-CM | POA: Diagnosis not present

## 2015-12-17 DIAGNOSIS — Z87442 Personal history of urinary calculi: Secondary | ICD-10-CM | POA: Diagnosis not present

## 2015-12-21 ENCOUNTER — Ambulatory Visit (INDEPENDENT_AMBULATORY_CARE_PROVIDER_SITE_OTHER): Payer: Commercial Managed Care - HMO | Admitting: Pharmacist

## 2015-12-21 DIAGNOSIS — Z7901 Long term (current) use of anticoagulants: Secondary | ICD-10-CM

## 2015-12-21 DIAGNOSIS — I2782 Chronic pulmonary embolism: Secondary | ICD-10-CM

## 2015-12-21 LAB — POCT INR: INR: 2

## 2015-12-21 NOTE — Patient Instructions (Signed)
Patient instructed to take medications as defined in the Anti-coagulation Track section of this encounter.  Patient instructed to take today's dose.  Patient verbalized understanding of these instructions.    

## 2015-12-21 NOTE — Progress Notes (Signed)
Anti-Coagulation Progress Note  Alexander English is a 60 y.o. male who is currently on an anti-coagulation regimen.    RECENT RESULTS: Recent results are below, the most recent result is correlated with a dose of 35 mg. per week: Lab Results  Component Value Date   INR 2.0 12/21/2015   INR 2.3 12/01/2015   INR 2.2 10/23/2015    ANTI-COAG DOSE: Anticoagulation Dose Instructions as of 12/21/2015      [name] Tue Wed Thu Fri Sat   New Dose 5 mg 7.5 mg 5 mg 5 mg 5 mg 5 mg 5 mg       ANTICOAG SUMMARY: Anticoagulation Episode Summary    Current INR goal 2.0-3.0  Next INR check 01/25/2016  INR from last check 2.0 (12/21/2015)  Weekly max dose   Target end date Indefinite  [facility] location [facility]  Preferred lab   Send INR reminders to    Indications  Pulmonary embolism ([place]) [I26.99]        Comments         ANTICOAG TODAY: Anticoagulation Summary as of 12/21/2015    INR goal 2.0-3.0  Selected INR 2.0 (12/21/2015)  Next INR check 01/25/2016  Target end date Indefinite   Indications  Pulmonary embolism ([place]) [I26.99]      Anticoagulation Episode Summary    INR check location [facility]   Preferred lab    Send INR reminders to    Comments       PATIENT INSTRUCTIONS: Patient Instructions  Patient instructed to take medications as defined in the Anti-coagulation Track section of this encounter.  Patient instructed to take today's dose.  Patient verbalized understanding of these instructions.       FOLLOW-UP Return in 5 weeks (on 01/25/2016) for Follow up INR at 0900h.  [name], III Pharm.D., CACP

## 2016-01-25 ENCOUNTER — Ambulatory Visit (INDEPENDENT_AMBULATORY_CARE_PROVIDER_SITE_OTHER): Payer: Commercial Managed Care - HMO | Admitting: Pharmacist

## 2016-01-25 DIAGNOSIS — Z7901 Long term (current) use of anticoagulants: Secondary | ICD-10-CM | POA: Diagnosis not present

## 2016-01-25 DIAGNOSIS — I2782 Chronic pulmonary embolism: Secondary | ICD-10-CM

## 2016-01-25 LAB — POCT INR: INR: 1.9

## 2016-01-25 NOTE — Patient Instructions (Signed)
Patient educated about medication as defined in this encounter and verbalized understanding by repeating back instructions provided.   

## 2016-01-25 NOTE — Progress Notes (Signed)
I have reviewed Dr. Julianne Rice note.  Patient is on Alexander English for h/o VTE and CVA.  INR low and coumadin increased.

## 2016-01-25 NOTE — Progress Notes (Signed)
Anticoagulation Management Alexander English is a 60 y.o. male who reports to the clinic for monitoring of warfarin treatment.    Indication: PEand CVA history Duration: indefinite  Anticoagulation Clinic Visit History: Patient does not report signs/symptoms of bleeding or thromboembolism or any other changes. Anticoagulation Episode Summary    Current INR goal:   2.0-3.0  TTR:   82.7 % (3.9 y)  Next INR check:   02/15/2016  INR from last check:   1.9! (01/25/2016)  Weekly max dose:     Target end date:   Indefinite  INR check location:   [facility]  Preferred lab:     Send INR reminders to:      Indications   Pulmonary embolism ([place]) [I26.99]       Comments:          ASSESSMENT Recent Results: The most recent result is correlated with 37.5 mg per week: Lab Results  Component Value Date   INR 1.9 01/25/2016   INR 2.0 12/21/2015   INR 2.3 12/01/2015   Anticoagulation Dosing: INR as of 01/25/2016 and Previous Dosing Information    INR Dt INR Goal [employer] Sun Mon Tue Wed Thu Fri Sat   01/25/2016 1.9 2.0-3.0 37.5 mg 5 mg 7.5 mg 5 mg 5 mg 5 mg 5 mg 5 mg    Anticoagulation Dose Instructions as of 01/25/2016      Total Sun Mon Tue Wed Thu Fri Sat   New Dose 40 mg 5 mg 7.5 mg 5 mg 5 mg 7.5 mg 5 mg 5 mg     (5 mg x 1)  (5 mg x 1.5)  (5 mg x 1)  (5 mg x 1)  (5 mg x 1.5)  (5 mg x 1)  (5 mg x 1)                           INR today: Subtherapeutic and trending down  PLAN Weekly dose was increased by 7% to 40 mg per week  Patient Instructions  Patient educated about medication as defined in this encounter and verbalized understanding by repeating back instructions provided.   Patient advised to contact clinic or seek medical attention if signs/symptoms of bleeding or thromboembolism occur.  Patient verbalized understanding by repeating back information and was advised to contact me if further medication-related questions arise. Patient was also provided an  information handout.  Follow-up Return in about 3 weeks (around 02/15/2016) for Around 02/15/16 at 9am for follow-up INR.  Kim,Jennifer J  15 minutes spent face-to-face with the patient during the encounter. 50% of time spent on education. 50% of time was spent on assessment and plan.

## 2016-02-11 ENCOUNTER — Telehealth: Payer: Self-pay

## 2016-02-11 ENCOUNTER — Encounter (HOSPITAL_COMMUNITY): Payer: Self-pay

## 2016-02-11 DIAGNOSIS — Z7901 Long term (current) use of anticoagulants: Secondary | ICD-10-CM | POA: Diagnosis not present

## 2016-02-11 DIAGNOSIS — Z8673 Personal history of transient ischemic attack (TIA), and cerebral infarction without residual deficits: Secondary | ICD-10-CM | POA: Insufficient documentation

## 2016-02-11 DIAGNOSIS — R040 Epistaxis: Secondary | ICD-10-CM | POA: Insufficient documentation

## 2016-02-11 DIAGNOSIS — Z7982 Long term (current) use of aspirin: Secondary | ICD-10-CM | POA: Insufficient documentation

## 2016-02-11 DIAGNOSIS — Z87891 Personal history of nicotine dependence: Secondary | ICD-10-CM | POA: Diagnosis not present

## 2016-02-11 LAB — I-STAT CHEM 8, ED
BUN: 10 mg/dL (ref 6–20)
Calcium, Ion: 1.28 mmol/L — ABNORMAL HIGH (ref 1.12–1.23)
Chloride: 105 mmol/L (ref 101–111)
Creatinine, Ser: 1 mg/dL (ref 0.61–1.24)
Glucose, Bld: 105 mg/dL — ABNORMAL HIGH (ref 65–99)
HCT: 45 % (ref 39.0–52.0)
Hemoglobin: 15.3 g/dL (ref 13.0–17.0)
Potassium: 3.8 mmol/L (ref 3.5–5.1)
Sodium: 143 mmol/L (ref 135–145)
TCO2: 27 mmol/L (ref 0–100)

## 2016-02-11 LAB — CBC
HCT: 42.6 % (ref 39.0–52.0)
Hemoglobin: 13.8 g/dL (ref 13.0–17.0)
MCH: 26.9 pg (ref 26.0–34.0)
MCHC: 32.4 g/dL (ref 30.0–36.0)
MCV: 83 fL (ref 78.0–100.0)
Platelets: 231 10*3/uL (ref 150–400)
RBC: 5.13 MIL/uL (ref 4.22–5.81)
RDW: 14.2 % (ref 11.5–15.5)
WBC: 6 10*3/uL (ref 4.0–10.5)

## 2016-02-11 LAB — PROTIME-INR
INR: 1.83
Prothrombin Time: 21.4 seconds — ABNORMAL HIGH (ref 11.4–15.2)

## 2016-02-11 NOTE — Telephone Encounter (Signed)
"  I have nose bleeds. This has been going on since May. And I mentioned this to Dr. Elie Confer and he said that it was probably dry air." "It's my left nostril". Denies any active bleeding at this time, but says he had a nose bleed about 25 minutes ago. "Sometimes when I'm brushing my teeth my nose will bleed". "I just try to hold my nose and it stops after I put tissue in my nostril." Denies and post nasal drainage, dizziness, or weakness. Requesting appointment on Monday morning as he will be here for Coumadin clinic at this time. Added to [facility] schedule. Advised patient to go to ED for any bleeding that does not subside. Patient agrees.

## 2016-02-11 NOTE — ED Triage Notes (Signed)
Pt has been having recent nosebleeds, two today and one yesterday. Pt is on coumadin. No bleeding noted now. No c/o of dizziness.

## 2016-02-11 NOTE — Telephone Encounter (Signed)
Agree with need to examine his nasal mucosa and check INR.  As this is a sub-acute issue that is not active at this time it is reasonable to schedule a concomitant appointment with his already scheduled Anticoagulation Clinic appointment on Monday (4 days from now).

## 2016-02-11 NOTE — Telephone Encounter (Signed)
Needs to speak regarding nose bleed.

## 2016-02-12 ENCOUNTER — Encounter (HOSPITAL_COMMUNITY): Payer: Self-pay | Admitting: Emergency Medicine

## 2016-02-12 ENCOUNTER — Emergency Department (HOSPITAL_COMMUNITY)
Admission: EM | Admit: 2016-02-12 | Discharge: 2016-02-12 | Disposition: A | Discharge status: Home / Self Care | Payer: Commercial Managed Care - HMO | Attending: Emergency Medicine | Admitting: Emergency Medicine

## 2016-02-12 ENCOUNTER — Telehealth: Payer: Self-pay | Admitting: Internal Medicine

## 2016-02-12 DIAGNOSIS — R040 Epistaxis: Secondary | ICD-10-CM

## 2016-02-12 NOTE — Telephone Encounter (Signed)
APT. REMINDER CALL, LMTCB °

## 2016-02-12 NOTE — ED Notes (Signed)
Pt called for vitals recheck x3. No answer.  

## 2016-02-12 NOTE — ED Provider Notes (Signed)
[facility] DEPT Provider Note   CSN: [id number] Arrival date & time: 02/11/16  1954  By signing my name below, I, Reola Mosher, attest that this documentation has been prepared under the direction and in the presence of Karena Kinker, MD. Electronically Signed: Reola Mosher, ED Scribe. 02/12/16. 3:11 AM.  History   Chief Complaint Chief Complaint  Patient presents with  . Epistaxis   The history is provided by the patient. No language interpreter was used.  Epistaxis   This is a new problem. The current episode started yesterday. The problem is associated with anticoagulants. The bleeding has been from both nares. He has tried a nasal tampon for the symptoms. The treatment provided mild relief.   HPI Comments: Alexander English is a 60 y.o. male who presents to the Emergency Department complaining of sudden onset, intermittent episodes of epistaxis to his bilateral nares, but worse on the left, x ~3 months. Pt reports that he has experienced two episodes today PTA (most recently ~9 hours ago), and one episode yesterday. He states that each episode will last ~4-5 minutes. Pt is also complaining of associated dizziness with the episodes. He is currently on Coumadin daily. He has applied nasal tampons to his nares with each episode with mild relief. His necxt INR appointment is in 3 days. Pt denies any other associated symptoms.   Past Medical History:  Diagnosis Date  . Chronic constipation 06/12/2013  . Chronic low back pain 06/12/2013   L4-5 facet arthropathy   . Chronic pain of both shoulders 12/07/2013   A/C joint osteoarthritis   . Cluster headaches    Resolved in 2006  . Embolic stroke involving right middle cerebral artery ([place]) 08/08/2004   Occured after traveling from [place] to [place] where he was hospitalized for 1 month with no further work-up or therapy.  Returned to [place] unable to walk and was admitted to [facility] immediately after landing. Presumed embolic per  neuro but TEE, bubble study, and hypercoag W/U negative. On indefinite coumadin per patient's informed preference.  Residual effects : Left spastic hemiplegia. Tx with phenol tibi  . Hyperplastic colon polyp 07/27/2012   Excised endoscopically 07/27/2012  . Left nephrolithiasis 08/31/2015   With mild left hydronephrosis.  Scheduled to undergo shockwave lithotripsy.  . Obesity (BMI 30.0-34.9) 12/07/2013  . Osteoarthritis of both knees 01/26/2012  . Positive PPD 12/07/2013  . Pulmonary embolism ([place]) 09/30/2004   Diagnosed in [place] 2006 after returning from [place].  Likely provoked as it occurred after a 12 hour plane flight within 2 months of R MCA CVA with left hemiparesis. Patient has made an informed decision to remain on lifelong warfarin.   . Urge incontinence of urine 08/31/2015   Possibly secondary to prior CVA.  Treated with Myrbetriq.   Patient Active Problem List   Diagnosis Date Noted  . Left nephrolithiasis 08/31/2015  . Urge incontinence of urine 08/31/2015  . Onychomycosis of left great toe 03/19/2015  . Chronic pain of both shoulders 12/07/2013  . Positive PPD 12/07/2013  . Obesity (BMI 30.0-34.9) 12/07/2013  . Chronic low back pain 06/12/2013  . Chronic constipation 06/12/2013  . Healthcare maintenance 03/21/2012  . Osteoarthritis of both knees 01/26/2012  . Hyperlipidemia 11/16/2006  . Pulmonary embolism ([place]) 09/30/2004  . History of stroke with residual deficit 08/08/2004   Past Surgical History:  Procedure Laterality Date  . FOOT SURGERY Bilateral    Bunion  . I&D EXTREMITY Left 11/28/2001   Left thumb thenar space abscess.  Home Medications    Prior to Admission medications   Medication Sig Start Date End Date Taking? Authorizing Provider  aspirin EC 81 MG tablet Take 81 mg by mouth daily.    Historical Provider, MD  atorvastatin (LIPITOR) 10 MG tablet Take 1 tablet (10 mg total) by mouth daily. 07/17/15   [name], MD  mirabegron ER (MYRBETRIQ) 25 MG TB24  tablet Take 25 mg by mouth daily.    Historical Provider, MD  Multiple Vitamin (MULTIVITAMIN) tablet Take 1 tablet by mouth daily.    Historical Provider, MD  warfarin (COUMADIN) 5 MG tablet Take 1 tablet (5 mg) daily except 0.5 tablet (2.5 mg) on Thursdays 10/05/15   [name], MD  Zoster Vaccine Live, PF, (ZOSTAVAX) 16109 UNT/0.65ML injection Inject 19,400 Units into the skin once. 10/23/15   [name], MD   Family History Family History  Problem Relation Age of Onset  . Stroke Maternal Grandmother   . Unexplained death Mother   . Depression Mother     After husband's death  . Unexplained death Father   . Osteoarthritis Father     Knees  . Early death Sister   . Seizures Sister   . Early death Brother 41    Unknown cause  . Healthy Daughter   . Healthy Son   . Stroke Maternal Aunt   . Healthy Sister   . Healthy Sister   . Unexplained death Brother   . Healthy Brother   . Healthy Son    Social History Social History  Substance Use Topics  . Smoking status: Former Smoker    Quit date: 06/28/2007  . Smokeless tobacco: Never Used  . Alcohol use No     Comment: Remote past   Allergies   Review of patient's allergies indicates no known allergies.  Review of Systems Review of Systems  Constitutional: Negative for fever.  HENT: Positive for nosebleeds.   Neurological: Negative for dizziness.  All other systems reviewed and are negative.  Physical Exam Updated Vital Signs BP 107/93 (BP Location: Right Arm)   Pulse 72   Temp 98.1 F (36.7 C) (Oral)   Resp 16   Ht 6' (1.829 m)   Wt 226 lb (102.5 kg)   SpO2 97%   BMI 30.65 kg/m   Physical Exam  Constitutional: He appears well-developed and well-nourished.  HENT:  Head: Normocephalic.  Mouth/Throat: Oropharynx is clear and moist. No oropharyngeal exudate.  No post-nasal drip. No anterior bleeding, or signs of bleeding on exam.   Eyes: Conjunctivae and EOM are normal. Pupils are equal, round, and reactive  to light. Right eye exhibits no discharge. Left eye exhibits no discharge. No scleral icterus.  Neck: Normal range of motion. Neck supple. No JVD present. No tracheal deviation present.  Trachea is midline. No stridor or carotid bruits.  Cardiovascular: Normal rate, regular rhythm, normal heart sounds and intact distal pulses.   No murmur heard. Pulmonary/Chest: Effort normal and breath sounds normal. No stridor. No respiratory distress. He has no wheezes. He has no rales.  Lungs CTA bilaterally.   Abdominal: Soft. Bowel sounds are normal. He exhibits no distension. There is no tenderness. There is no rebound and no guarding.  Musculoskeletal: Normal range of motion.  Lymphadenopathy:    He has no cervical adenopathy.  Neurological: He is alert. He has normal reflexes.  Skin: Skin is warm. Capillary refill takes less than 2 seconds.  Psychiatric: He has a normal mood and affect. His behavior is normal.  Nursing note and vitals reviewed.  ED Treatments / Results   COORDINATION OF CARE: 3:14 AM-Discussed next steps with pt. Pt verbalized understanding and is agreeable with the plan.   Vitals:   02/12/16 0129 02/12/16 0300  BP: 142/74 107/93  Pulse: 65 72  Resp: 16 16  Temp: 98.1 F (36.7 C)    Results for orders placed or performed during the hospital encounter of 02/12/16  CBC  Result Value Ref Range   WBC 6.0 4.0 - 10.5 K/uL   RBC 5.13 4.22 - 5.81 MIL/uL   Hemoglobin 13.8 13.0 - 17.0 g/dL   HCT 42.6 39.0 - 52.0 %   MCV 83.0 78.0 - 100.0 fL   MCH 26.9 26.0 - 34.0 pg   MCHC 32.4 30.0 - 36.0 g/dL   RDW 14.2 11.5 - 15.5 %   Platelets 231 150 - 400 K/uL  Protime-INR - (order if Patient is taking Coumadin / Warfarin)  Result Value Ref Range   Prothrombin Time 21.4 (H) 11.4 - 15.2 seconds   INR 1.83   I-Stat Chem 8, ED  Result Value Ref Range   Sodium 143 135 - 145 mmol/L   Potassium 3.8 3.5 - 5.1 mmol/L   Chloride 105 101 - 111 mmol/L   BUN 10 6 - 20 mg/dL   Creatinine,  Ser 1.00 0.61 - 1.24 mg/dL   Glucose, Bld 105 (H) 65 - 99 mg/dL   Calcium, Ion 1.28 (H) 1.12 - 1.23 mmol/L   TCO2 27 0 - 100 mmol/L   Hemoglobin 15.3 13.0 - 17.0 g/dL   HCT 45.0 39.0 - 52.0 %   No results found.  Procedures Procedures (including critical care time)  Medications Ordered in ED Medications - No data to display  Initial Impression / Assessment and Plan / ED Course  I have reviewed the triage vital signs and the nursing notes.  Pertinent labs & imaging results that were available during my care of the patient were reviewed by me and considered in my medical decision making (see chart for details).  Clinical Course   Results for orders placed or performed during the hospital encounter of 02/12/16  CBC  Result Value Ref Range   WBC 6.0 4.0 - 10.5 K/uL   RBC 5.13 4.22 - 5.81 MIL/uL   Hemoglobin 13.8 13.0 - 17.0 g/dL   HCT 42.6 39.0 - 52.0 %   MCV 83.0 78.0 - 100.0 fL   MCH 26.9 26.0 - 34.0 pg   MCHC 32.4 30.0 - 36.0 g/dL   RDW 14.2 11.5 - 15.5 %   Platelets 231 150 - 400 K/uL  Protime-INR - (order if Patient is taking Coumadin / Warfarin)  Result Value Ref Range   Prothrombin Time 21.4 (H) 11.4 - 15.2 seconds   INR 1.83   I-Stat Chem 8, ED  Result Value Ref Range   Sodium 143 135 - 145 mmol/L   Potassium 3.8 3.5 - 5.1 mmol/L   Chloride 105 101 - 111 mmol/L   BUN 10 6 - 20 mg/dL   Creatinine, Ser 1.00 0.61 - 1.24 mg/dL   Glucose, Bld 105 (H) 65 - 99 mg/dL   Calcium, Ion 1.28 (H) 1.12 - 1.23 mmol/L   TCO2 27 0 - 100 mmol/L   Hemoglobin 15.3 13.0 - 17.0 g/dL   HCT 45.0 39.0 - 52.0 %   No results found.   Final Clinical Impressions(s) / ED Diagnoses   Final diagnoses:  None    New Prescriptions New  Prescriptions   No medications on file    No acrive bleeding use saline nasal spray BID.  Humidifier.  Follow up with ENT All questions answered to patient's satisfaction. Based on history and exam patient has been appropriately medically screened and  emergency conditions excluded. Patient is stable for discharge at this time. Follow up with your PMDfor recheck in 2 daysand strict return precautions given.  I personally performed the services described in this documentation, which was scribed in my presence. The recorded information has been reviewed and is accurate.      Veatrice Kells, MD 02/12/16 [phone]

## 2016-02-12 NOTE — ED Notes (Signed)
Pt c/o bloody nose that occurs intermittently since May of this year. Pt expresses having two episodes today and two episodes yesterday. Pt does not have a bloody nose at this time.

## 2016-02-15 ENCOUNTER — Ambulatory Visit (INDEPENDENT_AMBULATORY_CARE_PROVIDER_SITE_OTHER): Payer: Commercial Managed Care - HMO | Admitting: Pharmacist

## 2016-02-15 ENCOUNTER — Ambulatory Visit (INDEPENDENT_AMBULATORY_CARE_PROVIDER_SITE_OTHER): Payer: Commercial Managed Care - HMO | Admitting: Internal Medicine

## 2016-02-15 ENCOUNTER — Encounter: Payer: Self-pay | Admitting: Internal Medicine

## 2016-02-15 VITALS — BP 116/61 | HR 67 | Temp 97.9°F | Ht 72.0 in | Wt 219.5 lb

## 2016-02-15 DIAGNOSIS — Z7901 Long term (current) use of anticoagulants: Secondary | ICD-10-CM

## 2016-02-15 DIAGNOSIS — Z0000 Encounter for general adult medical examination without abnormal findings: Secondary | ICD-10-CM

## 2016-02-15 DIAGNOSIS — Z7982 Long term (current) use of aspirin: Secondary | ICD-10-CM

## 2016-02-15 DIAGNOSIS — M25562 Pain in left knee: Secondary | ICD-10-CM

## 2016-02-15 DIAGNOSIS — G8929 Other chronic pain: Secondary | ICD-10-CM

## 2016-02-15 DIAGNOSIS — R040 Epistaxis: Secondary | ICD-10-CM | POA: Insufficient documentation

## 2016-02-15 DIAGNOSIS — M170 Bilateral primary osteoarthritis of knee: Secondary | ICD-10-CM | POA: Diagnosis not present

## 2016-02-15 DIAGNOSIS — J3489 Other specified disorders of nose and nasal sinuses: Secondary | ICD-10-CM

## 2016-02-15 DIAGNOSIS — I2699 Other pulmonary embolism without acute cor pulmonale: Secondary | ICD-10-CM | POA: Diagnosis not present

## 2016-02-15 DIAGNOSIS — Z23 Encounter for immunization: Secondary | ICD-10-CM

## 2016-02-15 LAB — POCT INR: INR: 2.3

## 2016-02-15 MED ORDER — OXYMETAZOLINE HCL 0.05 % NA SOLN: 1.0000 | Freq: Two times a day (BID) | NASAL | Status: DC | PRN

## 2016-02-15 MED ORDER — SALINE SPRAY 0.65 % NA SOLN: 1.0000 | Freq: Three times a day (TID) | NASAL | 1 refills | Status: DC

## 2016-02-15 NOTE — Assessment & Plan Note (Signed)
He was given a FLU shot today.

## 2016-02-15 NOTE — Progress Notes (Signed)
[id number] Bleed  HPI:  [name] is a 60 y.o. with PMH of VTE and CVA(with residual left sided weakness)presented to clinic today for evaluation of recurrent Epistaxis.He C/O recurrent episodes of nose bleed since May. He had two back to back episodes yesterday and he went to ER. He was not actively bleeding during his ED visit.They advised him to see [place] and get an ENT referral. His first episode was in May 2017, during a flight to [place].  Yesterday he had two episodes with dripping of 5-6 drops of blood and his bleeding stopped with packing his nose with tissue.He is on ASA and Coumadin. He denies any associated headaches, sinus congestion,facial pain,SOB, PND or seasonal allergies. He also denies any hematuria or melena or hematochesia. He also wants a new referral for his left knee for second opinion.At [facility] they recommend a full length leg brace and he is unable to handle with his one working hand.   Past Medical History:  Diagnosis Date  . Chronic constipation 06/12/2013  . Chronic low back pain 06/12/2013   L4-5 facet arthropathy   . Chronic pain of both shoulders 12/07/2013   A/C joint osteoarthritis   . Cluster headaches    Resolved in 2006  . Embolic stroke involving right middle cerebral artery ([place]) 08/08/2004   Occured after traveling from [place] to [place] where he was hospitalized for 1 month with no further work-up or therapy.  Returned to [place] unable to walk and was admitted to [facility] immediately after landing. Presumed embolic per neuro but TEE, bubble study, and hypercoag W/U negative. On indefinite coumadin per patient's informed preference.  Residual effects : Left spastic hemiplegia. Tx with phenol tibi  . Hyperplastic colon polyp 07/27/2012   Excised endoscopically 07/27/2012  . Left nephrolithiasis 08/31/2015   With mild left hydronephrosis.  Scheduled to undergo shockwave lithotripsy.  . Obesity (BMI 30.0-34.9) 12/07/2013  . Osteoarthritis of  both knees 01/26/2012  . Positive PPD 12/07/2013  . Pulmonary embolism ([place]) 09/30/2004   Diagnosed in April 2006 after returning from [place].  Likely provoked as it occurred after a 12 hour plane flight within 2 months of R MCA CVA with left hemiparesis. Patient has made an informed decision to remain on lifelong warfarin.   . Urge incontinence of urine 08/31/2015   Possibly secondary to prior CVA.  Treated with Myrbetriq.    Review of Systems:  As per HPI.  Physical Exam:  Vitals:   02/15/16 0910  BP: 116/61  Pulse: 67  Temp: 97.9 F (36.6 C)  TempSrc: Oral  SpO2: 100%  Weight: 219 lb 8 oz (99.6 kg)  Height: 6' (1.829 m)   General: Well built man, sitting comfortably, with left arm on his side.No acute distress HEENT: There is mild edema in both nares. There on one small engorged blood vessel anteriorly in left nasal septum. No active bleeding.No naso pharyngeal erythema or exudate. Chest: Clear Bilaterally CVS: RRR, No R/M/G    Labs. CBC Latest Ref Rng & Units 02/11/2016 02/11/2016 06/25/2015  WBC 4.0 - 10.5 K/uL - 6.0 -  Hemoglobin 13.0 - 17.0 g/dL 15.3 13.8 16.3  Hematocrit 39.0 - 52.0 % 45.0 42.6 48.0  Platelets 150 - 400 K/uL - 231 -   BMP Latest Ref Rng & Units 02/11/2016 10/23/2015 06/25/2015  Glucose 65 - 99 mg/dL 105(H) 95 116(H)  BUN 6 - 20 mg/dL 10 10 13   Creatinine 0.61 - 1.24 mg/dL 1.00 0.87 0.90  BUN/Creat Ratio 10 -  24 - 11 -  Sodium 135 - 145 mmol/L 143 142 146(H)  Potassium 3.5 - 5.1 mmol/L 3.8 4.3 3.6  Chloride 101 - 111 mmol/L 105 102 105  CO2 18 - 29 mmol/L - 21 -  Calcium 8.6 - 10.2 mg/dL - 9.7 -   INR: 2.3  Assessment & Plan:   See Encounters Tab for problem based charting.  Patient seen with Dr. Lynnae January

## 2016-02-15 NOTE — Progress Notes (Signed)
Internal Medicine Clinic Attending  I saw and evaluated the patient.  I personally confirmed the key portions of the history and exam documented by Dr. Amin and I reviewed pertinent patient test results.  The assessment, diagnosis, and plan were formulated together and I agree with the documentation in the resident's note. 

## 2016-02-15 NOTE — Assessment & Plan Note (Signed)
He was requesting another orthopedic referral for second opinion as [place] wants him to try a long full leg length brace and he is unable to handle it cos. Of his one working hand. He wants to explore some other options.  Plan: We provided with another referral.

## 2016-02-15 NOTE — Patient Instructions (Signed)
It was pleasure taking care of you today. It looks like dryness is playing a role in your nasal bleed. Please use 1-2 saline nasal spray in each nostril 2-3 times daily. You can use Afrin nasal spray during active bleeding and then pack with a cotton ball. Use a humidifier at home You can use a very small amount of Vaseline to prevent extra dryness too. We will send you to ENT if these measures will not be able to control your bleeding.

## 2016-02-15 NOTE — Patient Instructions (Signed)
Patient instructed to take medications as defined in the Anti-coagulation Track section of this encounter.  Patient instructed to take today's dose.  Patient verbalized understanding of these instructions.    

## 2016-02-15 NOTE — Progress Notes (Signed)
Anti-Coagulation Progress Note  Alexander English is a 60 y.o. male who is currently on an anti-coagulation regimen.    RECENT RESULTS: Recent results are below, the most recent result is correlated with a dose of 40 mg. per week: Lab Results  Component Value Date   INR 2.30 02/15/2016   INR 1.83 02/11/2016   INR 1.9 01/25/2016    ANTI-COAG DOSE: Anticoagulation Dose Instructions as of 02/15/2016      [name] Tue Wed Thu Fri Sat   New Dose 5 mg 7.5 mg 5 mg 5 mg 7.5 mg 5 mg 5 mg       ANTICOAG SUMMARY: Anticoagulation Episode Summary    Current INR goal:   2.0-3.0  TTR:   81.7 % (3.9 y)  Next INR check:   03/07/2016  INR from last check:   2.30 (02/15/2016)  Weekly max dose:     Target end date:   Indefinite  [facility] check [facility]   [facility]  Preferred lab:     Send INR reminders to:      Indications   Pulmonary embolism ([place]) [I26.99]       Comments:           ANTICOAG TODAY: Anticoagulation Summary  As of 02/15/2016   INR goal:   2.0-3.0  TTR:     Today's INR:   2.30  Next INR check:   03/07/2016  Target end date:   Indefinite   Indications   Pulmonary embolism ([place]) [I26.99]        Anticoagulation Episode Summary    INR check location[facility]   [facility]   Preferred lab:      Send INR reminders to:      Comments:         PATIENT INSTRUCTIONS: Patient Instructions  Patient instructed to take medications as defined in the Anti-coagulation Track section of this encounter.  Patient instructed to taketoday's dose.  Patient verbalized understanding of these instructions.       FOLLOW-UP Return in about 3 weeks (around 03/07/2016) for Follow up INR at 0900h.  [name], III Pharm.D., CACP

## 2016-02-19 ENCOUNTER — Other Ambulatory Visit: Payer: Self-pay | Admitting: *Deleted

## 2016-02-19 DIAGNOSIS — H40013 Open angle with borderline findings, low risk, bilateral: Secondary | ICD-10-CM

## 2016-02-19 NOTE — Progress Notes (Signed)
Pt has appt with [facility] on Mon Aug. 28th.@ 0830.  He has been seen by them in the past, but his insurance requires a new referral with dx code H40.013.  Referral placed.    Of note, [facility] reached out to patient to make this appointment and then realized he would need a new referral for insurance purposes.

## 2016-02-22 ENCOUNTER — Ambulatory Visit: Payer: Commercial Managed Care - HMO

## 2016-02-27 DIAGNOSIS — M25562 Pain in left knee: Secondary | ICD-10-CM | POA: Diagnosis not present

## 2016-03-07 ENCOUNTER — Ambulatory Visit (INDEPENDENT_AMBULATORY_CARE_PROVIDER_SITE_OTHER): Payer: Commercial Managed Care - HMO | Admitting: Pharmacist

## 2016-03-07 DIAGNOSIS — Z7901 Long term (current) use of anticoagulants: Secondary | ICD-10-CM

## 2016-03-07 DIAGNOSIS — I2699 Other pulmonary embolism without acute cor pulmonale: Secondary | ICD-10-CM | POA: Diagnosis not present

## 2016-03-07 LAB — POCT INR: INR: 2.7

## 2016-03-07 NOTE — Progress Notes (Signed)
Anti-Coagulation Progress Note  Alexander English is a 60 y.o. male who is currently on an anti-coagulation regimen.    RECENT RESULTS: Recent results are below, the most recent result is correlated with a dose of 40 mg. per week: Lab Results  Component Value Date   INR 2.70 03/07/2016   INR 2.30 02/15/2016   INR 1.83 02/11/2016    ANTI-COAG DOSE: Anticoagulation Dose Instructions as of 03/07/2016      [name] Tue Wed Thu Fri Sat   New Dose 5 mg 7.5 mg 5 mg 5 mg 7.5 mg 5 mg 5 mg       ANTICOAG SUMMARY: Anticoagulation Episode Summary    Current INR goal:   2.0-3.0  TTR:   81.9 % (4 y)  Next INR check:   04/04/2016  INR from last check:   2.70 (03/07/2016)  Weekly max dose:     Target end date:   Indefinite  INR check location[facility]   [facility]  Preferred lab:     Send INR reminders to:      Indications   Pulmonary embolism ([place]) [I26.99]       Comments:           ANTICOAG TODAY: Anticoagulation Summary  As of 03/07/2016   INR goal:   2.0-3.0  TTR:     Today's INR:   2.70  Next INR check:   04/04/2016  Target end date:   Indefinite   Indications   Pulmonary embolism ([place]) [I26.99]        Anticoagulation Episode Summary    INR check location[facility]   [facility]   Preferred lab:      Send INR reminders to:      Comments:         PATIENT INSTRUCTIONS: There are no Patient Instructions on file for this visit.   FOLLOW-UP Return in 4 weeks (on 04/04/2016) for Follow up INR at 0900h.  [name], III Pharm.D., CACP

## 2016-03-07 NOTE — Progress Notes (Signed)
Indication: Provoked venous thromboembolism. Duration: Indefinite per patient preference. INR: At target. Dr. Gladstone Pih documentation reviewed.

## 2016-03-07 NOTE — Patient Instructions (Signed)
Patient instructed to take medications as defined in the Anti-coagulation Track section of this encounter.  Patient instructed to take today's dose.  Patient verbalized understanding of these instructions.    

## 2016-03-24 ENCOUNTER — Telehealth: Payer: Self-pay | Admitting: Internal Medicine

## 2016-03-24 NOTE — Telephone Encounter (Signed)
APT. REMINDER CALL, LMTCB °

## 2016-03-25 ENCOUNTER — Encounter: Payer: Self-pay | Admitting: Internal Medicine

## 2016-03-25 ENCOUNTER — Ambulatory Visit (INDEPENDENT_AMBULATORY_CARE_PROVIDER_SITE_OTHER): Payer: Commercial Managed Care - HMO | Admitting: Internal Medicine

## 2016-03-25 VITALS — BP 129/75 | HR 79 | Temp 98.0°F | Wt 227.4 lb

## 2016-03-25 DIAGNOSIS — M545 Low back pain, unspecified: Secondary | ICD-10-CM

## 2016-03-25 DIAGNOSIS — M172 Bilateral post-traumatic osteoarthritis of knee: Secondary | ICD-10-CM

## 2016-03-25 DIAGNOSIS — Z7901 Long term (current) use of anticoagulants: Secondary | ICD-10-CM | POA: Diagnosis not present

## 2016-03-25 DIAGNOSIS — Z0000 Encounter for general adult medical examination without abnormal findings: Secondary | ICD-10-CM

## 2016-03-25 DIAGNOSIS — N3941 Urge incontinence: Secondary | ICD-10-CM

## 2016-03-25 DIAGNOSIS — R040 Epistaxis: Secondary | ICD-10-CM

## 2016-03-25 DIAGNOSIS — I6930 Unspecified sequelae of cerebral infarction: Secondary | ICD-10-CM

## 2016-03-25 DIAGNOSIS — I2782 Chronic pulmonary embolism: Secondary | ICD-10-CM

## 2016-03-25 DIAGNOSIS — I69854 Hemiplegia and hemiparesis following other cerebrovascular disease affecting left non-dominant side: Secondary | ICD-10-CM

## 2016-03-25 DIAGNOSIS — Z79899 Other long term (current) drug therapy: Secondary | ICD-10-CM

## 2016-03-25 DIAGNOSIS — E785 Hyperlipidemia, unspecified: Secondary | ICD-10-CM

## 2016-03-25 DIAGNOSIS — G8929 Other chronic pain: Secondary | ICD-10-CM | POA: Diagnosis not present

## 2016-03-25 DIAGNOSIS — M170 Bilateral primary osteoarthritis of knee: Secondary | ICD-10-CM

## 2016-03-25 DIAGNOSIS — Z7982 Long term (current) use of aspirin: Secondary | ICD-10-CM

## 2016-03-25 LAB — POCT INR: INR: 2.4

## 2016-03-25 MED ORDER — CYCLOBENZAPRINE HCL 5 MG PO TABS: 5.0000 mg | ORAL_TABLET | Freq: Three times a day (TID) | ORAL | 11 refills | Status: DC | PRN

## 2016-03-25 NOTE — Assessment & Plan Note (Signed)
Assessment  He continues to have intermittent recurrent epistaxes. He is requesting referral to ENT for further assessment. We discussed the issue of anticoagulation and he would like to continue with the warfarin for now.  Plan  An ambulatory referral for ENT was placed to evaluate his recurrent epistaxis. We will reassess his symptoms at the follow-up visit.

## 2016-03-25 NOTE — Assessment & Plan Note (Signed)
Assessment  He has significant osteoarthritis of the knees related to his antalgic gait from his residual deficits after his cerebrovascular accident. Nonsteroidal anti-inflammatory agents are contraindicated given his need for anticoagulation and epistaxis. He is working with the brace manufacturer to try to rig a system where he is able to put on and remove the brace with 1 hand only.  Plan  We'll reassess the success in adjusting the brace function so that he could utilize it with 1 hand only when he is seen at follow-up.

## 2016-03-25 NOTE — Assessment & Plan Note (Signed)
It was recommended he get the Zostavax. He has the prescription that was given to him previously. At the follow-up visit we will see if he has completed the Zostavax immunization. He is otherwise up-to-date on his health care maintenance.

## 2016-03-25 NOTE — Progress Notes (Signed)
   Subjective:    Patient ID: Alexander English, male    DOB: 1956-02-07, 60 y.o.   MRN: [id number]  HPI  Alexander English is here for follow-up of chronic back and leg pain, recurrent epistaxes, and history of CVA with residual deficits. Please see the A&P for the status of the pt's chronic medical problems.  Review of Systems  Constitutional: Negative for activity change, appetite change and unexpected weight change.  HENT: Positive for nosebleeds.   Respiratory: Negative for chest tightness and shortness of breath.   Cardiovascular: Negative for chest pain, palpitations and leg swelling.  Gastrointestinal: Negative for abdominal pain, constipation, diarrhea, nausea and vomiting.  Musculoskeletal: Positive for arthralgias, back pain, gait problem and myalgias. Negative for joint swelling.  Skin: Negative for rash and wound.  Hematological: Bruises/bleeds easily.      Objective:   Physical Exam  Constitutional: He is oriented to person, place, and time. He appears well-developed and well-nourished. No distress.  HENT:  Head: Normocephalic and atraumatic.  Eyes: Conjunctivae are normal. Right eye exhibits no discharge. Left eye exhibits no discharge. No scleral icterus.  Cardiovascular: Normal rate, regular rhythm and normal heart sounds.  Exam reveals no gallop and no friction rub.   No murmur heard. Pulmonary/Chest: Effort normal and breath sounds normal. No respiratory distress. He has no wheezes. He has no rales.  Abdominal: Soft. Bowel sounds are normal. He exhibits no distension. There is no tenderness. There is no rebound and no guarding.  Musculoskeletal: He exhibits no edema or tenderness.  Neurological: He is alert and oriented to person, place, and time.  Skin: Skin is warm and dry. No rash noted. He is not diaphoretic. No erythema.  Psychiatric: He has a normal mood and affect. His behavior is normal. Judgment and thought content normal.  Nursing note and vitals  reviewed.     Assessment & Plan:   Please see problem oriented charting.

## 2016-03-25 NOTE — Patient Instructions (Signed)
It was great to see you again today.  1) Keep taking the medications as you are.  2) I sent a referral to ENT to evaluate you for your recurrent nose bleeds.  3) Consider getting your zostavax.  4) We will start flexeril for muscle pain in the back from tightness and spasms.  Tart 5 mg every 8 hours as needed.  If this is not completely resolving your discomfort you can take up to 10 mg three times a day as needed.  I will see you back in 6 months, sooner if necessary.

## 2016-03-25 NOTE — Assessment & Plan Note (Signed)
Assessment  He continues to have stable, but chronic low back pain related to his antalgic gait. He has been to orthopedic surgery on 2 occasions for a primary and secondary opinion regarding options. In both instances the large brace was recommended as the solution. The problem with this was that he had difficulty manipulating the brace with just one hand. He is therefore working with the brace makers to come up with a solution where he is able to place and remove the brace with one hand. He is hopeful this can be accomplished. He notes after the last several weeks he has had some worsening in the right paraspinous area of his back. Examination revealed significant tightness along the right paraspinous muscles in the upper lumbar region. This is consistent with muscle spasms.  Plan  He will continue to work with the brace manufacturer to try to rig it in a way that he is able to place and remove the large brace. For the acute on chronic back pain related to right paraspinous muscle spasms he will be started on Flexeril 5 mg by mouth every 8 hours as needed for pain. He was told he could increase it to 10 mg every 8 hours as needed for pain should he require a higher dose. He was also warned of the sedative effects as well as the possible dizziness that it could result in. We will reassess the success of this pharmacological therapy for his back pain at the return visit.

## 2016-03-25 NOTE — Assessment & Plan Note (Signed)
Assessment  His urge incontinence is reasonably well controlled symptomatically with the Myrbetriq.  Plan  We will continue with the Myrbetriq and reassess the effectiveness of this therapy at the follow-up visit.

## 2016-03-25 NOTE — Assessment & Plan Note (Signed)
Assessment  He is tolerating his moderate intensity statin, atorvastatin 10 mg by mouth daily well without myalgias.  Plan  We will continue the atorvastatin at 10 mg by mouth daily and reassess for intolerances at the follow-up visit.

## 2016-03-25 NOTE — Assessment & Plan Note (Signed)
Assessment  Per personal preference he has wished to remain on chronic anticoagulation. He's had recent epistaxes, but despite this his INRs have been either slightly below therapeutic range or well within therapeutic range. His INR today is within the therapeutic range at 2.4 on his current warfarin dosing.  Plan  We will continue with the warfarin at the current dose given the INR of 2.4. He will continue management of his anticoagulation in the anticoagulation clinic.

## 2016-03-25 NOTE — Assessment & Plan Note (Signed)
Assessment  He has had no signs or symptoms suggestive of recurrent cerebrovascular accident although he continues with left hemiplegia. He is tolerating his aspirin and statin therapy well.  Plan  We will continue risk factor modification of his hyperlipidemia with the statin therapy. We will also continue the aspirin as an antiplatelet and the warfarin in case he had a paradoxical embolus. We will reassess his clinical status at the follow-up visit.

## 2016-04-04 ENCOUNTER — Ambulatory Visit: Payer: Commercial Managed Care - HMO

## 2016-04-12 DIAGNOSIS — R040 Epistaxis: Secondary | ICD-10-CM | POA: Diagnosis not present

## 2016-04-12 DIAGNOSIS — Z7901 Long term (current) use of anticoagulants: Secondary | ICD-10-CM | POA: Diagnosis not present

## 2016-04-12 DIAGNOSIS — Z8673 Personal history of transient ischemic attack (TIA), and cerebral infarction without residual deficits: Secondary | ICD-10-CM | POA: Diagnosis not present

## 2016-04-12 DIAGNOSIS — Z5181 Encounter for therapeutic drug level monitoring: Secondary | ICD-10-CM | POA: Diagnosis not present

## 2016-04-15 DIAGNOSIS — H40023 Open angle with borderline findings, high risk, bilateral: Secondary | ICD-10-CM | POA: Diagnosis not present

## 2016-04-15 DIAGNOSIS — H04123 Dry eye syndrome of bilateral lacrimal glands: Secondary | ICD-10-CM | POA: Diagnosis not present

## 2016-04-18 ENCOUNTER — Ambulatory Visit (INDEPENDENT_AMBULATORY_CARE_PROVIDER_SITE_OTHER): Payer: Commercial Managed Care - HMO | Admitting: Pharmacist

## 2016-04-18 DIAGNOSIS — I2692 Saddle embolus of pulmonary artery without acute cor pulmonale: Secondary | ICD-10-CM | POA: Diagnosis not present

## 2016-04-18 DIAGNOSIS — Z7901 Long term (current) use of anticoagulants: Secondary | ICD-10-CM | POA: Diagnosis not present

## 2016-04-18 LAB — POCT INR: INR: 2.7

## 2016-04-18 NOTE — Patient Instructions (Signed)
Patient instructed to take medications as defined in the Anti-coagulation Track section of this encounter.  Patient instructed to take today's dose.  Patient verbalized understanding of these instructions.    

## 2016-04-18 NOTE — Progress Notes (Signed)
Anti-Coagulation Progress Note  Alexander English is a 60 y.o. male who is currently on an anti-coagulation regimen.    RECENT RESULTS: Recent results are below, the most recent result is correlated with a dose of 40 mg. per week: Lab Results  Component Value Date   INR 2.70 04/18/2016   INR 2.4 03/25/2016   INR 2.70 03/07/2016    ANTI-COAG DOSE: Anticoagulation Dose Instructions as of 04/18/2016      [name] Tue Wed Thu Fri Sat   New Dose 5 mg 7.5 mg 5 mg 5 mg 7.5 mg 5 mg 5 mg       ANTICOAG SUMMARY: Anticoagulation Episode Summary    Current INR goal:   2.0-3.0  TTR:   82.4 % (4.1 y)  Next INR check:   05/16/2016  INR from last check:   2.70 (04/18/2016)  Weekly max dose:     Target end date:   Indefinite  [facility]   [facility]  Preferred lab:     Send INR reminders to:      Indications   Pulmonary embolism ([place]) [I26.99]       Comments:           ANTICOAG TODAY: Anticoagulation Summary  As of 04/18/2016   INR goal:   2.0-3.0  TTR:     Today's INR:   2.70  Next INR check:   05/16/2016  Target end date:   Indefinite   Indications   Pulmonary embolism ([place]) [I26.99]        Anticoagulation Episode Summary    [facility] check location[facility]   [facility]   Preferred lab:      Send INR reminders to:      Comments:         PATIENT INSTRUCTIONS: There are no Patient Instructions on file for this visit.   FOLLOW-UP Return in 4 weeks (on 05/16/2016) for Follow up INR at 0900h.  [name], III Pharm.D., CACP

## 2016-04-18 NOTE — Progress Notes (Signed)
INTERNAL MEDICINE TEACHING ATTENDING ADDENDUM - [name], DO Duration- indefinite (patient preference) , Indication- Hx PE, INR-  Lab Results  Component Value Date   INR 2.70 04/18/2016  . Agree with pharmacy recommendations as outlined in their note.

## 2016-05-16 ENCOUNTER — Ambulatory Visit (INDEPENDENT_AMBULATORY_CARE_PROVIDER_SITE_OTHER): Payer: Commercial Managed Care - HMO | Admitting: Pharmacist

## 2016-05-16 DIAGNOSIS — Z86711 Personal history of pulmonary embolism: Secondary | ICD-10-CM | POA: Diagnosis not present

## 2016-05-16 DIAGNOSIS — Z7901 Long term (current) use of anticoagulants: Secondary | ICD-10-CM | POA: Diagnosis not present

## 2016-05-16 DIAGNOSIS — I2692 Saddle embolus of pulmonary artery without acute cor pulmonale: Secondary | ICD-10-CM

## 2016-05-16 LAB — POCT INR: INR: 3

## 2016-05-16 NOTE — Patient Instructions (Signed)
Patient instructed to take medications as defined in the Anti-coagulation Track section of this encounter.  Patient instructed to take today's dose.  Patient verbalized understanding of these instructions.    

## 2016-05-16 NOTE — Progress Notes (Signed)
Anti-Coagulation Progress Note  Alexander English is a 60 y.o. male who is currently on an anti-coagulation regimen.    RECENT RESULTS: Recent results are below, the most recent result is correlated with a dose of 40 mg. per week: Lab Results  Component Value Date   INR 3.00 05/16/2016   INR 2.70 04/18/2016   INR 2.4 03/25/2016    ANTI-COAG DOSE: Anticoagulation Dose Instructions as of 05/16/2016      [name] Tue Wed Thu Fri Sat   New Dose 5 mg 7.5 mg 5 mg 5 mg 7.5 mg 5 mg 5 mg       ANTICOAG SUMMARY: Anticoagulation Episode Summary    Current INR goal:   2.0-3.0  TTR:   82.8 % (4.2 y)  Next INR check:   06/13/2016  INR from last check:   3.00 (05/16/2016)  Weekly max dose:     Target end date:   Indefinite  [facility]   [facility]  Preferred lab:     Send INR reminders to:      Indications   Pulmonary embolism ([place]) [I26.99]       Comments:           ANTICOAG TODAY: Anticoagulation Summary  As of 05/16/2016   INR goal:   2.0-3.0  TTR:     Today's INR:   3.00  Next INR check:   06/13/2016  Target end date:   Indefinite   Indications   Pulmonary embolism ([place]) [I26.99]        Anticoagulation Episode Summary    [facility] check [facility]   Preferred lab:      Send INR reminders to:      Comments:         PATIENT INSTRUCTIONS: Patient instructed to take medications as defined in the Anti-coagulation Track section of this encounter.  Patient instructed to take today's dose.  Patient verbalized understanding of these instructions.       FOLLOW-UP Return in 4 weeks (on 06/13/2016) for Follow up INR @ 0900h.  [name], III Pharm.D., CACP

## 2016-05-16 NOTE — Progress Notes (Signed)
INTERNAL MEDICINE TEACHING ATTENDING ADDENDUM - Duncan Vincent M.D  Duration- indefinite, Indication- PE, INR- therapeutic. Agree with pharmacy recommendations as outlined in their note.      

## 2016-05-18 ENCOUNTER — Encounter: Payer: Self-pay | Admitting: Internal Medicine

## 2016-05-18 DIAGNOSIS — H269 Unspecified cataract: Secondary | ICD-10-CM | POA: Insufficient documentation

## 2016-05-18 DIAGNOSIS — H35033 Hypertensive retinopathy, bilateral: Secondary | ICD-10-CM

## 2016-05-18 DIAGNOSIS — H04123 Dry eye syndrome of bilateral lacrimal glands: Secondary | ICD-10-CM

## 2016-05-18 HISTORY — DX: Hypertensive retinopathy, bilateral: H35.033

## 2016-05-18 HISTORY — DX: Dry eye syndrome of bilateral lacrimal glands: H04.123

## 2016-05-18 HISTORY — DX: Unspecified cataract: H26.9

## 2016-05-25 ENCOUNTER — Other Ambulatory Visit: Payer: Self-pay | Admitting: Internal Medicine

## 2016-05-25 DIAGNOSIS — E785 Hyperlipidemia, unspecified: Secondary | ICD-10-CM

## 2016-06-13 ENCOUNTER — Ambulatory Visit (INDEPENDENT_AMBULATORY_CARE_PROVIDER_SITE_OTHER): Payer: Commercial Managed Care - HMO | Admitting: Pharmacist

## 2016-06-13 DIAGNOSIS — Z7901 Long term (current) use of anticoagulants: Secondary | ICD-10-CM

## 2016-06-13 DIAGNOSIS — I2782 Chronic pulmonary embolism: Secondary | ICD-10-CM

## 2016-06-13 LAB — POCT INR: INR: 2.8

## 2016-06-13 NOTE — Progress Notes (Signed)
INTERNAL MEDICINE TEACHING ATTENDING ADDENDUM - Alphonse Asbridge M.D  Duration- indefinite, Indication- PE, INR- therapeutic. Agree with pharmacy recommendations as outlined in their note.     

## 2016-06-13 NOTE — Patient Instructions (Signed)
Patient instructed to take medications as defined in the Anti-coagulation Track section of this encounter.  Patient instructed to take today's dose.  Patient instructed to take 1&1/2 tablets of your 5mg  peach colored warfarin tablets on Mondays and Thursdays. All other days, take 1 tablet only. Patient verbalized understanding of these instructions.

## 2016-06-13 NOTE — Progress Notes (Signed)
Anti-Coagulation Progress Note  Alexander English is a 60 y.o. male who is currently on an anti-coagulation regimen.    RECENT RESULTS: Recent results are below, the most recent result is correlated with a dose of 40 mg. per week: Lab Results  Component Value Date   INR 2.80 06/13/2016   INR 3.00 05/16/2016   INR 2.70 04/18/2016    ANTI-COAG DOSE: Anticoagulation Dose Instructions as of 06/13/2016      [name] Tue Wed Thu Fri Sat   New Dose 5 mg 7.5 mg 5 mg 5 mg 7.5 mg 5 mg 5 mg       ANTICOAG SUMMARY: Anticoagulation Episode Summary    Current INR goal:   2.0-3.0  TTR:   83.1 % (4.2 y)  Next INR check:   07/18/2016  INR from last check:   2.80 (06/13/2016)  Weekly max dose:     Target end date:   Indefinite  [facility]  Preferred lab:     Send INR reminders to:      Indications   Pulmonary embolism ([place]) [I26.99]       Comments:           ANTICOAG TODAY: Anticoagulation Summary  As of 06/13/2016   INR goal:   2.0-3.0  TTR:     Today's INR:   2.80  Next INR check:   07/18/2016  Target end date:   Indefinite   Indications   Pulmonary embolism ([place]) [I26.99]        Anticoagulation Episode Summary    [facility] check [facility]   Preferred lab:      Send INR reminders to:      Comments:         PATIENT INSTRUCTIONS: Patient instructed to take medications as defined in the Anti-coagulation Track section of this encounter.  Patient instructed to take today's dose.  Patient instructed to take 1&1/2 tablets of your 5mg  peach colored warfarin tablets on Mondays and Thursdays. All other days, take 1 tablet only. Patient verbalized understanding of these instructions.     FOLLOW-UP Return in 5 weeks (on 07/18/2016) for Follow up INR at 0945h.  [name], III Pharm.D., CACP

## 2016-07-18 ENCOUNTER — Ambulatory Visit (INDEPENDENT_AMBULATORY_CARE_PROVIDER_SITE_OTHER): Payer: Medicare HMO | Admitting: Internal Medicine

## 2016-07-18 ENCOUNTER — Telehealth: Payer: Self-pay | Admitting: *Deleted

## 2016-07-18 ENCOUNTER — Ambulatory Visit (INDEPENDENT_AMBULATORY_CARE_PROVIDER_SITE_OTHER): Payer: Medicare HMO | Admitting: Pharmacist

## 2016-07-18 VITALS — BP 105/85 | HR 85 | Temp 97.6°F | Ht 72.0 in | Wt 233.2 lb

## 2016-07-18 DIAGNOSIS — Z823 Family history of stroke: Secondary | ICD-10-CM

## 2016-07-18 DIAGNOSIS — I2782 Chronic pulmonary embolism: Secondary | ICD-10-CM | POA: Diagnosis not present

## 2016-07-18 DIAGNOSIS — Z818 Family history of other mental and behavioral disorders: Secondary | ICD-10-CM

## 2016-07-18 DIAGNOSIS — Z8261 Family history of arthritis: Secondary | ICD-10-CM

## 2016-07-18 DIAGNOSIS — Z8673 Personal history of transient ischemic attack (TIA), and cerebral infarction without residual deficits: Secondary | ICD-10-CM

## 2016-07-18 DIAGNOSIS — Z7901 Long term (current) use of anticoagulants: Secondary | ICD-10-CM

## 2016-07-18 DIAGNOSIS — L739 Follicular disorder, unspecified: Secondary | ICD-10-CM | POA: Insufficient documentation

## 2016-07-18 DIAGNOSIS — B9689 Other specified bacterial agents as the cause of diseases classified elsewhere: Secondary | ICD-10-CM

## 2016-07-18 DIAGNOSIS — Z8489 Family history of other specified conditions: Secondary | ICD-10-CM

## 2016-07-18 DIAGNOSIS — L02413 Cutaneous abscess of right upper limb: Secondary | ICD-10-CM | POA: Diagnosis not present

## 2016-07-18 DIAGNOSIS — Z87891 Personal history of nicotine dependence: Secondary | ICD-10-CM

## 2016-07-18 DIAGNOSIS — L02423 Furuncle of right upper limb: Secondary | ICD-10-CM | POA: Diagnosis not present

## 2016-07-18 DIAGNOSIS — Z820 Family history of epilepsy and other diseases of the nervous system: Secondary | ICD-10-CM

## 2016-07-18 LAB — POCT INR: INR: 2.3

## 2016-07-18 MED ORDER — DOXYCYCLINE HYCLATE 50 MG PO CAPS: 100.0000 mg | ORAL_CAPSULE | Freq: Two times a day (BID) | ORAL | 0 refills | Status: AC

## 2016-07-18 NOTE — Telephone Encounter (Signed)
Received call from pharmacy regarding pt's new rx for doxycycline.  Pt currently on warfarin-will need to discuss doxy rx with Pharm D prior to pt picking up rx.  Pt aware and will be contacted back this afternoon with instructions.Alexander Hidden Cassady1/22/201812:14 PM

## 2016-07-18 NOTE — Progress Notes (Signed)
Internal Medicine Clinic Attending  I saw and evaluated the patient.  I personally confirmed the key portions of the history and exam documented by Dr. Gay Filler and I reviewed pertinent patient test results.  The assessment, diagnosis, and plan were formulated together and I agree with the documentation in the resident's note.  I personally supervised the I and D. Patient with small amount of purulent drainage and blood. Will start PO abx for 5 day course. Patient to follow up if no improvement or symptoms worsen

## 2016-07-18 NOTE — Patient Instructions (Signed)
Patient instructed to take medications as defined in the Anti-coagulation Track section of this encounter.  Patient instructed to take today's dose.  Patient instructed to take 1&1/2 tablets on Mondays, Wednesdays and Fridays. All other days, take only 1 tablet.  Patient verbalized understanding of these instructions.

## 2016-07-18 NOTE — Assessment & Plan Note (Addendum)
Patient presented with a 4 day history of painful swelling of the right forearm. He first noticed a boil beginning to form while he was in the shower Thursday last week. He reports that this is continued to get larger. He has put a bandage over it to prevent any drainage from getting on his clothing. He does note some serous as well as yellow puslike drainage.  Patient denies any fevers or other systemic signs of infection. He denies any previous history of folliculitis or other soft tissue infections. Patient has been treating this topically with Neosporin ointment.  Plan: I&D in the clinic today with some purulent drainage. The abscess appeared loculated in these were broken up with blunt dissection. At the end of the procedure there did appear to be some smaller loculations which were not accessed however for concern for patient comfort it was decided to be managed with oral antibiotics. Doxycycline 5 days.

## 2016-07-18 NOTE — Progress Notes (Signed)
Anti-Coagulation Progress Note  Alexander English is a 61 y.o. male who is currently on an anti-coagulation regimen.    RECENT RESULTS: Recent results are below, the most recent result is correlated with a dose of 40 mg. per week: Lab Results  Component Value Date   INR 2.30 07/18/2016   INR 2.80 06/13/2016   INR 3.00 05/16/2016    ANTI-COAG DOSE: Anticoagulation Dose Instructions as of 07/18/2016      [name] Tue Wed Thu Fri Sat   New Dose 5 mg 7.5 mg 5 mg 7.5 mg 5 mg 7.5 mg 5 mg    Description   Take 1&1/2 tablets on Mondays, Wednesdays and Fridays. All other days, take only 1 tablet.       ANTICOAG SUMMARY: Anticoagulation Episode Summary    Current INR goal:   2.0-3.0  TTR:   83.5 % (4.3 y)  Next INR check:   08/15/2016  INR from last check:   2.30 (07/18/2016)  Weekly max dose:     Target end date:   Indefinite  [facility]  Preferred lab:     Send INR reminders to:      Indications   Pulmonary embolism ([place]) [I26.99]       Comments:           ANTICOAG TODAY: Anticoagulation Summary  As of 07/18/2016   INR goal:   2.0-3.0  TTR:     Today's INR:   2.30  Next INR check:   08/15/2016  Target end date:   Indefinite   Indications   Pulmonary embolism ([place]) [I26.99]        Anticoagulation Episode Summary    [facility] check [facility]   Preferred lab:      Send INR reminders to:      Comments:         PATIENT INSTRUCTIONS: Patient Instructions  Patient instructed to take medications as defined in the Anti-coagulation Track section of this encounter.  Patient instructed to take today's dose.  Patient instructed to take 1&1/2 tablets on Mondays, Wednesdays and Fridays. All other days, take only 1 tablet.  Patient verbalized understanding of these instructions.       FOLLOW-UP Return in about 4 weeks (around 08/15/2016) for Follow up INR at 0930.  [name], III Pharm.D., CACP

## 2016-07-18 NOTE — Progress Notes (Signed)
CC: folliculitis HPI: [name] is a 61 y.o. male with a h/o of CVA, HLD who presents for boil on R forearm.  Please see Problem-based charting for HPI and the status of patient's chronic medical conditions.  Past Medical History:  Diagnosis Date  . Bilateral cataracts 05/18/2016   Not visually significant  . Chronic constipation 06/12/2013  . Chronic low back pain 06/12/2013   L4-5 facet arthropathy   . Chronic pain of both shoulders 12/07/2013   A/C joint osteoarthritis   . Cluster headaches    Resolved in 2006  . Dry eye syndrome, bilateral 05/18/2016  . Embolic stroke involving right middle cerebral artery ([place]) 08/08/2004   Occured after traveling from [place] to [place] where he was hospitalized for 1 month with no further work-up or therapy.  Returned to [place] unable to walk and was admitted to [facility] immediately after landing. Presumed embolic per neuro but TEE, bubble study, and hypercoag W/U negative. On indefinite coumadin per patient's informed preference.  Residual effects : Left spastic hemiplegia. Tx with phenol tibi  . Hyperplastic colon polyp 07/27/2012   Excised endoscopically 07/27/2012  . Hypertensive retinopathy of both eyes 05/18/2016  . Left nephrolithiasis 08/31/2015   With mild left hydronephrosis.  Scheduled to undergo shockwave lithotripsy.  . Obesity (BMI 30.0-34.9) 12/07/2013  . Osteoarthritis of both knees 01/26/2012  . Positive PPD 12/07/2013  . Pulmonary embolism ([place]) 09/30/2004   Diagnosed in April 2006 after returning from [place].  Likely provoked as it occurred after a 12 hour plane flight within 2 months of R MCA CVA with left hemiparesis. Patient has made an informed decision to remain on lifelong warfarin.   . Urge incontinence of urine 08/31/2015   Possibly secondary to prior CVA.  Treated with Myrbetriq.   Social History  Substance Use Topics  . Smoking status: Former Smoker    Quit date: 06/28/2007  . Smokeless tobacco: Never Used  . Alcohol  use No     Comment: Remote past   Family History  Problem Relation Age of Onset  . Stroke Maternal Grandmother   . Unexplained death Mother   . Depression Mother     After husband's death  . Unexplained death Father   . Osteoarthritis Father     Knees  . Early death Sister   . Seizures Sister   . Early death Brother 49    Unknown cause  . Healthy Daughter   . Healthy Son   . Stroke Maternal Aunt   . Healthy Sister   . Healthy Sister   . Unexplained death Brother   . Healthy Brother   . Healthy Son    Review of Systems: ROS in HPI. Otherwise: Review of Systems  Constitutional: Negative for chills, fever and weight loss.  Respiratory: Negative for cough and shortness of breath.   Cardiovascular: Negative for chest pain and leg swelling.  Gastrointestinal: Negative for abdominal pain, constipation, diarrhea, nausea and vomiting.  Genitourinary: Negative for dysuria, frequency and urgency.   Physical Exam: Vitals:   07/18/16 1024  BP: 105/85  Pulse: 85  Temp: 97.6 F (36.4 C)  TempSrc: Oral  SpO2: 100%  Weight: 233 lb 3.2 oz (105.8 kg)  Height: 6' (1.829 m)   Physical Exam  Constitutional: He is cooperative. No distress.  Cardiovascular: Normal rate, regular rhythm, normal heart sounds and normal pulses.  Exam reveals no gallop.   No murmur heard. Pulmonary/Chest: Effort normal and breath sounds normal. No respiratory distress.  He has no wheezes. He has no rhonchi. He has no rales.  Abdominal: Soft. Bowel sounds are normal. There is no tenderness.  Musculoskeletal: He exhibits no edema.  Boil which was TTP on R forearm. Erythematous base, 2-3 cm. Purulent drainage expressed on firm palpation. Mild abrasion near follicular opening w/ some serous drainage.  Skin: Skin is intact.    Assessment & Plan:  See encounters tab for problem based medical decision making. Patient seen with Dr. Wylene Simmer Patient presented with a 4 day history of painful  swelling of the right forearm. He first noticed a boil beginning to form while he was in the shower Thursday last week. He reports that this is continued to get larger. He has put a bandage over it to prevent any drainage from getting on his clothing. He does note some serous as well as yellow puslike drainage.  Patient denies any fevers or other systemic signs of infection. He denies any previous history of folliculitis or other soft tissue infections. Patient has been treating this topically with Neosporin ointment.  Plan: I&D in the clinic today with some purulent drainage. The abscess appeared loculated in these were broken up with blunt dissection. At the end of the procedure there did appear to be some smaller loculations which were not accessed however for concern for patient comfort it was decided to be managed with oral antibiotics. Doxycycline 5 days.  Incision and Drainage Procedure Note  Pre-operative Diagnosis: folliculitis w/ abcess  Post-operative Diagnosis: same  Indications: soft-tissue abcess  Anesthesia: 1% plain lidocaine  Procedure Details  The procedure, risks and complications have been discussed in detail (including, but not limited to airway compromise, infection, bleeding) with the patient, and the patient has signed consent to the procedure.  The skin was sterilely prepped and draped over the affected area in the usual fashion. After adequate local anesthesia, I&D with a #11 blade was performed on the right forearm. Purulent drainage: present The patient was observed until stable. The wound was packed with moistened gauze. Sterile dressings were applied.  Findings: Small amount of purulent drainage and loculated abscess accessed.  EBL: 2 cc's  Drains: none  Condition: Tolerated procedure well   Complications: none.  Signed: Holley Raring, MD 07/18/2016, 11:24 AM  Pager: [phone]

## 2016-07-18 NOTE — Progress Notes (Signed)
INTERNAL MEDICINE TEACHING ATTENDING ADDENDUM - Lavonne Cass M.D  Duration- indefinite, Indication- PE, INR- therapeutic. Agree with pharmacy recommendations as outlined in their note.     

## 2016-07-18 NOTE — Patient Instructions (Signed)
Please take Doxycycline for 5 days. Avoid sun exposure during this time. If your symptoms are not resolving or you develop fevers, or increasing warmth, redness, or pain at the site of infection, call the clinic for further evaluation.

## 2016-07-27 ENCOUNTER — Other Ambulatory Visit: Payer: Self-pay | Admitting: Internal Medicine

## 2016-07-27 DIAGNOSIS — Z86711 Personal history of pulmonary embolism: Secondary | ICD-10-CM

## 2016-08-04 DIAGNOSIS — M21862 Other specified acquired deformities of left lower leg: Secondary | ICD-10-CM | POA: Diagnosis not present

## 2016-08-04 DIAGNOSIS — R269 Unspecified abnormalities of gait and mobility: Secondary | ICD-10-CM | POA: Diagnosis not present

## 2016-08-04 DIAGNOSIS — M21372 Foot drop, left foot: Secondary | ICD-10-CM | POA: Diagnosis not present

## 2016-08-15 ENCOUNTER — Ambulatory Visit (INDEPENDENT_AMBULATORY_CARE_PROVIDER_SITE_OTHER): Payer: Medicare HMO | Admitting: Pharmacist

## 2016-08-15 DIAGNOSIS — Z7901 Long term (current) use of anticoagulants: Secondary | ICD-10-CM | POA: Diagnosis not present

## 2016-08-15 DIAGNOSIS — I2782 Chronic pulmonary embolism: Secondary | ICD-10-CM

## 2016-08-15 LAB — POCT INR: INR: 2.8

## 2016-08-15 NOTE — Patient Instructions (Signed)
Patient instructed to take medications as defined in the Anti-coagulation Track section of this encounter.  Patient instructed to take today's dose.  Patient instructed to take 1&1/2 tablets on Mondays, Wednesdays and Fridays. All other days, take only 1 tablet.  Patient verbalized understanding of these instructions.

## 2016-08-15 NOTE — Progress Notes (Signed)
Anti-Coagulation Progress Note  Alexander English is a 61 y.o. male who is currently on an anti-coagulation regimen.    RECENT RESULTS: Recent results are below, the most recent result is correlated with a dose of 42.5 mg. per week: Lab Results  Component Value Date   INR 2.80 08/15/2016   INR 2.30 07/18/2016   INR 2.80 06/13/2016    ANTI-COAG DOSE: Anticoagulation Dose Instructions as of 08/15/2016      Sun Mon Tue Wed Thu Fri Sat   New Dose 5 mg 7.5 mg 5 mg 7.5 mg 5 mg 7.5 mg 5 mg    Description   Take 1&1/2 tablets on Mondays, Wednesdays and Fridays. All other days, take only 1 tablet.       ANTICOAG SUMMARY: Anticoagulation Episode Summary    Current INR goal:   2.0-3.0  TTR:   83.7 % (4.4 y)  Next INR check:   09/12/2016  INR from last check:   2.80 (08/15/2016)  Weekly max dose:     Target end date:   Indefinite  [facility] check [facility]   [facility]  Preferred lab:     Send INR reminders to:      Indications   Pulmonary embolism ([place]) [I26.99]       Comments:           ANTICOAG TODAY: Anticoagulation Summary  As of 08/15/2016   INR goal:   2.0-3.0  TTR:     Today's INR:   2.80  Next INR check:   09/12/2016  Target end date:   Indefinite   Indications   Pulmonary embolism ([place]) [I26.99]        Anticoagulation Episode Summary    [facility] check [facility]   [facility]   Preferred lab:      Send INR reminders to:      Comments:         PATIENT INSTRUCTIONS: Patient instructed to take medications as defined in the Anti-coagulation Track section of this encounter.  Patient instructed to take today's dose.  Patient instructed to take 1&1/2 tablets on Mondays, Wednesdays and Fridays. All other days, take only 1 tablet.  Patient verbalized understanding of these instructions.     FOLLOW-UP Return in 4 weeks (on 09/12/2016) for Follow up INR at 0900h.  [name], III Pharm.D., CACP

## 2016-08-15 NOTE — Progress Notes (Signed)
Indication: Venous thromboembolism. Duration: Indefinite per patient's informed decision. INR: At target. Agree with Dr. Groce's assessment and plan. 

## 2016-09-02 ENCOUNTER — Other Ambulatory Visit: Payer: Self-pay | Admitting: Internal Medicine

## 2016-09-02 DIAGNOSIS — Z86711 Personal history of pulmonary embolism: Secondary | ICD-10-CM

## 2016-09-06 ENCOUNTER — Encounter (HOSPITAL_COMMUNITY): Payer: Self-pay | Admitting: *Deleted

## 2016-09-06 ENCOUNTER — Emergency Department (HOSPITAL_COMMUNITY)
Admission: EM | Admit: 2016-09-06 | Discharge: 2016-09-06 | Disposition: A | Discharge status: Home / Self Care | Payer: Medicare HMO | Attending: Emergency Medicine | Admitting: Emergency Medicine

## 2016-09-06 DIAGNOSIS — Z79899 Other long term (current) drug therapy: Secondary | ICD-10-CM | POA: Insufficient documentation

## 2016-09-06 DIAGNOSIS — Z87891 Personal history of nicotine dependence: Secondary | ICD-10-CM | POA: Insufficient documentation

## 2016-09-06 DIAGNOSIS — H35033 Hypertensive retinopathy, bilateral: Secondary | ICD-10-CM | POA: Diagnosis not present

## 2016-09-06 DIAGNOSIS — Z7901 Long term (current) use of anticoagulants: Secondary | ICD-10-CM | POA: Insufficient documentation

## 2016-09-06 DIAGNOSIS — R040 Epistaxis: Secondary | ICD-10-CM

## 2016-09-06 DIAGNOSIS — R000 Tachycardia, unspecified: Secondary | ICD-10-CM | POA: Diagnosis not present

## 2016-09-06 DIAGNOSIS — Z7982 Long term (current) use of aspirin: Secondary | ICD-10-CM | POA: Insufficient documentation

## 2016-09-06 LAB — BASIC METABOLIC PANEL
Anion gap: 14 (ref 5–15)
BUN: 18 mg/dL (ref 6–20)
CO2: 18 mmol/L — ABNORMAL LOW (ref 22–32)
Calcium: 9.2 mg/dL (ref 8.9–10.3)
Chloride: 108 mmol/L (ref 101–111)
Creatinine, Ser: 0.97 mg/dL (ref 0.61–1.24)
GFR calc Af Amer: >60 mL/min (ref >60)
GFR calc non Af Amer: >60 mL/min (ref >60)
Glucose, Bld: 121 mg/dL — ABNORMAL HIGH (ref 65–99)
Potassium: 4 mmol/L (ref 3.5–5.1)
Sodium: 140 mmol/L (ref 135–145)

## 2016-09-06 LAB — CBC
HCT: 37 % — ABNORMAL LOW (ref 39.0–52.0)
Hemoglobin: 11.7 g/dL — ABNORMAL LOW (ref 13.0–17.0)
MCH: 26.3 pg (ref 26.0–34.0)
MCHC: 31.6 g/dL (ref 30.0–36.0)
MCV: 83.1 fL (ref 78.0–100.0)
Platelets: 218 10*3/uL (ref 150–400)
RBC: 4.45 MIL/uL (ref 4.22–5.81)
RDW: 14.4 % (ref 11.5–15.5)
WBC: 7.3 10*3/uL (ref 4.0–10.5)

## 2016-09-06 LAB — PROTIME-INR
INR: 3.4
Prothrombin Time: 35.1 seconds — ABNORMAL HIGH (ref 11.4–15.2)

## 2016-09-06 MED ORDER — OXYMETAZOLINE HCL 0.05 % NA SOLN
1.0000 | Freq: Once | NASAL | Status: AC
  Administered 2016-09-06: 1 via NASAL
  Filled 2016-09-06: qty 15

## 2016-09-06 MED ORDER — SODIUM CHLORIDE 0.9 % IV BOLUS (SEPSIS): 1000.0000 mL | Freq: Once | INTRAVENOUS | Status: DC

## 2016-09-06 NOTE — ED Provider Notes (Addendum)
TIME SEEN: 5:23 AM  CHIEF COMPLAINT: Left-sided epistaxis  HPI: Patient is a 61 year old male with history of chronic back pain, previous embolic stroke with left-sided hemiplegia currently on Coumadin who presents the emergency department with left-sided nosebleed that started yesterday morning and has been intermittent. No injury to the face or nose. He has not been putting anything into his nose other than tissue since it started bleeding. No fevers, cough. He states he does feel slightly dizzy but he describes it as feeling "sleepy". No chest pain or shortness of breath. No vomiting or diarrhea.  ROS: See HPI Constitutional: no fever  Eyes: no drainage  ENT: no runny nose   Cardiovascular:  no chest pain  Resp: no SOB  GI: no vomiting GU: no dysuria Integumentary: no rash  Allergy: no hives  Musculoskeletal: no leg swelling  Neurological: no slurred speech ROS otherwise negative  PAST MEDICAL HISTORY/PAST SURGICAL HISTORY:  Past Medical History:  Diagnosis Date  . Bilateral cataracts 05/18/2016   Not visually significant  . Chronic constipation 06/12/2013  . Chronic low back pain 06/12/2013   L4-5 facet arthropathy   . Chronic pain of both shoulders 12/07/2013   A/C joint osteoarthritis   . Cluster headaches    Resolved in 2006  . Dry eye syndrome, bilateral 05/18/2016  . Embolic stroke involving right middle cerebral artery ([place]) 08/08/2004   Occured after traveling from [place] to [place] where he was hospitalized for 1 month with no further work-up or therapy.  Returned to [place] unable to walk and was admitted to [facility] immediately after landing. Presumed embolic per neuro but TEE, bubble study, and hypercoag W/U negative. On indefinite coumadin per patient's informed preference.  Residual effects : Left spastic hemiplegia. Tx with phenol tibi  . Hyperplastic colon polyp 07/27/2012   Excised endoscopically 07/27/2012  . Hypertensive retinopathy of both eyes 05/18/2016  . Left  nephrolithiasis 08/31/2015   With mild left hydronephrosis.  Scheduled to undergo shockwave lithotripsy.  . Obesity (BMI 30.0-34.9) 12/07/2013  . Osteoarthritis of both knees 01/26/2012  . Positive PPD 12/07/2013  . Pulmonary embolism ([place]) 09/30/2004   Diagnosed in April 2006 after returning from [place].  Likely provoked as it occurred after a 12 hour plane flight within 2 months of R MCA CVA with left hemiparesis. Patient has made an informed decision to remain on lifelong warfarin.   . Urge incontinence of urine 08/31/2015   Possibly secondary to prior CVA.  Treated with Myrbetriq.    MEDICATIONS:  Prior to Admission medications   Medication Sig Start Date End Date Taking? Authorizing Provider  aspirin EC 81 MG tablet Take 81 mg by mouth daily.    Historical Provider, MD  atorvastatin (LIPITOR) 10 MG tablet Take 1 tablet (10 mg total) by mouth daily. 05/27/16   [name], MD  cyclobenzaprine (FLEXERIL) 5 MG tablet Take 1 tablet (5 mg total) by mouth 3 (three) times daily as needed for muscle spasms. 03/25/16   [name], MD  mirabegron ER (MYRBETRIQ) 25 MG TB24 tablet Take 25 mg by mouth daily.    Historical Provider, MD  Multiple Vitamin (MULTIVITAMIN) tablet Take 1 tablet by mouth daily.    Historical Provider, MD  sodium chloride (OCEAN) 0.65 % SOLN nasal spray Place 1 spray into both nostrils 3 (three) times daily between meals. 02/15/16   Lorella Nimrod, MD  warfarin (COUMADIN) 5 MG tablet Take 1 tablet (5 mg) on Sundays, Tuesday, Thursdays, and Saturdays and take 1 1/2 tablets (7.5  mg) on Mondays, Wednesdays, and Fridays 09/05/16   [name], MD  Zoster Vaccine Live, PF, (ZOSTAVAX) 00762 UNT/0.65ML injection Inject 19,400 Units into the skin once. Patient not taking: Reported on 08/15/2016 10/23/15   [name], MD    ALLERGIES:  No Known Allergies  SOCIAL HISTORY:  Social History  Substance Use Topics  . Smoking status: Former Smoker    Quit date: 06/28/2007  . Smokeless  tobacco: Never Used  . Alcohol use No     Comment: Remote past    FAMILY HISTORY: Family History  Problem Relation Age of Onset  . Stroke Maternal Grandmother   . Unexplained death Mother   . Depression Mother     After husband's death  . Unexplained death Father   . Osteoarthritis Father     Knees  . Early death Sister   . Seizures Sister   . Early death Brother 75    Unknown cause  . Healthy Daughter   . Healthy Son   . Stroke Maternal Aunt   . Healthy Sister   . Healthy Sister   . Unexplained death Brother   . Healthy Brother   . Healthy Son     EXAM: BP 119/91 (BP Location: Right Arm)   Pulse 114   Temp 98.8 F (37.1 C) (Oral)   Resp 16   Ht 6' (1.829 m)   Wt 233 lb (105.7 kg)   BMI 31.60 kg/m  CONSTITUTIONAL: Alert and oriented and responds appropriately to questions. Well-appearing; well-nourished HEAD: Normocephalic EYES: Conjunctivae clear, pupils appear equal, EOMI ENT: normal nose; moist mucous membranes, blood in the posterior oropharynx, blood seen in both nostrils but worse on the left side, no significant hemorrhaging, very slow bleed, mostly just see blood on the tissue when he wipes his nose NECK: Supple, no meningismus, no nuchal rigidity, no LAD  CARD: RRR; S1 and S2 appreciated; no murmurs, no clicks, no rubs, no gallops RESP: Normal chest excursion without splinting or tachypnea; breath sounds clear and equal bilaterally; no wheezes, no rhonchi, no rales, no hypoxia or respiratory distress, speaking full sentences ABD/GI: Normal bowel sounds; non-distended; soft, non-tender, no rebound, no guarding, no peritoneal signs, no hepatosplenomegaly BACK:  The back appears normal and is non-tender to palpation, there is no CVA tenderness EXT: Normal ROM in all joints; non-tender to palpation; no edema; normal capillary refill; no cyanosis, no calf tenderness or swelling    SKIN: Normal color for age and race; warm; no rash NEURO: Left-sided hemiplegia  which is his baseline, moves the right upper and lower extremity normally PSYCH: The patient's mood and manner are appropriate. Grooming and personal hygiene are appropriate.  MEDICAL DECISION MAKING: Patient here with nosebleed for the past 24 hours. Initially tachycardic but normotensive. He states he feels dizzy but does not describe this as being lightheaded or having vertigo but instead is feeling "sleepy" because he states he has not slept all day because of the recurrent nosebleed. We will have him blow his nose and spray Afrin into both nostrils and hold pressure for 30 minutes. We will monitor his blood pressure closely.  ED PROGRESS: 7:00 AM  Patient's hemoglobin is 11.7 which is an acute drop from previous in August 2017 but not low enough that he needs a blood transfusion. His heart rate has improved and his blood pressure is 101/67. He is no longer feeling dizzy. He has been drinking without difficulty. We will annually patient in the emergency department. Stopped after Afrin and  direct pressure. Will continue to monitor patient.  7:20 AM  Pt's INR is slightly supratherapeutic at 3.4. His bicarbonate is low at 18 but normal glucose and normal anion gap. He continues to have intermittent soft blood pressures that are lower than normal for him. We'll get a liter of IV fluids and then reassess.  8:00 AM  Pt's blood pressure has improved without intervention. He is not orthostatic.  He has been able to drink and ambulate without any difficulty. He is no longer feeling dizzy. He has had multiple normal blood pressures now - 113/67, 120s/70s. No more hypotension which may have been related to blood pressure cuff size and positioning. Unable to obtain IV access and he refuses further attempts. I feel at this time he is safe to be discharged home. I have discussed with him that there is a drop in his hemoglobin but again not low enough that he would need a blood transfusion. Have recommended follow-up  with Dr. Eppie Gibson for recheck in the next several days. He is comfortable with this. We'll also give ENT follow-up and discussed supportive care instructions if he began having a nosebleed again. He has been given a bottle of Afrin and a nose clamp to take home with him.   At this time, I do not feel there is any life-threatening condition present. I have reviewed and discussed all results (EKG, imaging, lab, urine as appropriate) and exam findings with patient/family. I have reviewed nursing notes and appropriate previous records.  I feel the patient is safe to be discharged home without further emergent workup and can continue workup as an outpatient as needed. Discussed usual and customary return precautions. Patient/family verbalize understanding and are comfortable with this plan.  Outpatient follow-up has been provided if needed. All questions have been answered.   Marland KitchenEpistaxis Management Date/Time: 09/06/2016 7:04 AM Performed by: Nyra Jabs Authorized by: Nyra Jabs   Consent:    Consent obtained:  Verbal   Consent given by:  Patient   Risks discussed:  Bleeding, infection and nasal injury   Alternatives discussed:  No treatment Procedure details:    Treatment site:  L posterior   Treatment method:  Afrin and direct pressure   Treatment complexity:  Limited   Treatment episode: initial   Post-procedure details:    Assessment:  Bleeding stopped   Patient tolerance of procedure:  Tolerated well, no immediate complications     Delice Bison Ward, DO 09/06/16 [place], DO 09/06/16 [id number]

## 2016-09-06 NOTE — ED Notes (Signed)
Attempted PIV x 1 right wrist, unsuccessful. Patient states that he doesn't want to be stuck again

## 2016-09-06 NOTE — Discharge Instructions (Signed)
You may use nasal saline to keep your mucous membranes moist. You may use a humidifier.  Other than nasal saline, please do not put anything into your nose for the next 3-4 days. Please do not blow your nose for the next 3-4 days. If your nose begins to bleed again, at this time it is okay to blow your nose and blow out all of the clots and then spray Afrin nasal spray into both nostrils and hold direct pressure for 30 minutes without stopping. If this does not stop the bleeding, please return to the hospital.  

## 2016-09-06 NOTE — ED Triage Notes (Signed)
Pt to ED from home c/o nosebleed that initially started on Monday morning. Pt reports being able to "work with it at home" but cannot stop the bleeding at present. Bleeding noted primarily from left nostril. Pt takes coumadin for stroke EMS VS 132/68, pulse 108 98%

## 2016-09-06 NOTE — ED Notes (Signed)
Patient to be discharged home, attempting to provide cab  Voucher due to mobility issues due to previous stroke

## 2016-09-06 NOTE — ED Notes (Signed)
Cab voucher provided for patient

## 2016-09-12 ENCOUNTER — Telehealth: Payer: Self-pay | Admitting: Internal Medicine

## 2016-09-12 NOTE — Telephone Encounter (Signed)
APT. REMINDER CALL, LMTCB °

## 2016-09-13 ENCOUNTER — Ambulatory Visit (INDEPENDENT_AMBULATORY_CARE_PROVIDER_SITE_OTHER): Payer: Medicare HMO | Admitting: Pharmacist

## 2016-09-13 ENCOUNTER — Ambulatory Visit (INDEPENDENT_AMBULATORY_CARE_PROVIDER_SITE_OTHER): Payer: Medicare HMO | Admitting: Internal Medicine

## 2016-09-13 VITALS — BP 114/66 | HR 77 | Temp 98.5°F | Ht 72.0 in | Wt 234.9 lb

## 2016-09-13 DIAGNOSIS — Z7901 Long term (current) use of anticoagulants: Secondary | ICD-10-CM

## 2016-09-13 DIAGNOSIS — I2782 Chronic pulmonary embolism: Secondary | ICD-10-CM

## 2016-09-13 DIAGNOSIS — Z5189 Encounter for other specified aftercare: Secondary | ICD-10-CM

## 2016-09-13 DIAGNOSIS — R040 Epistaxis: Secondary | ICD-10-CM

## 2016-09-13 LAB — POCT INR: INR: 3.5

## 2016-09-13 NOTE — Progress Notes (Signed)
   CC: epistaxis f/u  HPI:  [name] is a 61 y.o. with past medical history as outlined below who presents to clinic for epistaxis follow-up. Please see problem list for further details of patient's chronic medical issues.  Past Medical History:  Diagnosis Date  . Bilateral cataracts 05/18/2016   Not visually significant  . Chronic constipation 06/12/2013  . Chronic low back pain 06/12/2013   L4-5 facet arthropathy   . Chronic pain of both shoulders 12/07/2013   A/C joint osteoarthritis   . Cluster headaches    Resolved in 2006  . Dry eye syndrome, bilateral 05/18/2016  . Embolic stroke involving right middle cerebral artery ([place]) 08/08/2004   Occured after traveling from [place] to [place] where he was hospitalized for 1 month with no further work-up or therapy.  Returned to [place] unable to walk and was admitted to [facility] immediately after landing. Presumed embolic per neuro but TEE, bubble study, and hypercoag W/U negative. On indefinite coumadin per patient's informed preference.  Residual effects : Left spastic hemiplegia. Tx with phenol tibi  . Hyperplastic colon polyp 07/27/2012   Excised endoscopically 07/27/2012  . Hypertensive retinopathy of both eyes 05/18/2016  . Left nephrolithiasis 08/31/2015   With mild left hydronephrosis.  Scheduled to undergo shockwave lithotripsy.  . Obesity (BMI 30.0-34.9) 12/07/2013  . Osteoarthritis of both knees 01/26/2012  . Positive PPD 12/07/2013  . Pulmonary embolism ([place]) 09/30/2004   Diagnosed in April 2006 after returning from [place].  Likely provoked as it occurred after a 12 hour plane flight within 2 months of R MCA CVA with left hemiparesis. Patient has made an informed decision to remain on lifelong warfarin.   . Urge incontinence of urine 08/31/2015   Possibly secondary to prior CVA.  Treated with Myrbetriq.    Review of Systems:  Denies further episodes of epistaxis. Not having any dizziness, lightheadedness, blood in the stool, blood in  his urine.  Physical Exam:  Vitals:   09/13/16 1015  BP: 114/66  Pulse: 77  Temp: 98.5 F (36.9 C)  TempSrc: Oral  SpO2: 100%  Weight: 234 lb 14.4 oz (106.5 kg)  Height: 6' (1.829 m)   Physical Exam  Constitutional: appears well-developed and well-nourished. No distress.  HENT:  Head: Normocephalic and atraumatic.  Nose: Nose normal. enlarged turbinates that are not inflamed.  Cardiovascular: Normal rate, regular rhythm and normal heart sounds.  Exam reveals no gallop and no friction rub.   No murmur heard. Pulmonary/Chest: Effort normal and breath sounds normal. No respiratory distress.  has no wheezes.no rales.  Abdominal: Soft. Bowel sounds are normal.  exhibits no distension. There is no tenderness. There is no rebound and no guarding.  Neurological: alert and oriented to person, place, and time.  Skin: Skin is warm and dry. No rash noted.  not diaphoretic. No erythema. No pallor.   Assessment & Plan:   See Encounters Tab for problem based charting.  Patient discussed with Dr. Angelia Mould

## 2016-09-13 NOTE — Patient Instructions (Signed)
Patient instructed to take medications as defined in the Anti-coagulation Track section of this encounter.  Patient instructed to take  today's dose.  Patient verbalized understanding of these instructions.  Take 1 tablet every day except Friday. Take 1 and 1/2 tablets on Friday.

## 2016-09-13 NOTE — Progress Notes (Signed)
Anti-Coagulation Progress Note  Alexander English is a 61 y.o. male who is currently on an anti-coagulation regimen.    RECENT RESULTS: Recent results are below, the most recent result is correlated with a dose of 42.5 mg. per week: Lab Results  Component Value Date   INR 3.5 09/13/2016   INR 3.40 09/06/2016   INR 2.80 08/15/2016    ANTI-COAG DOSE: Anticoagulation Dose Instructions as of 09/13/2016      [name] Tue Wed Thu Fri Sat   New Dose 5 mg 5 mg 5 mg 5 mg 5 mg 7.5 mg 5 mg    Description   Take 1 tablet all days except Friday. Take 1 and 1/2  tablets on Friday.      ANTICOAG SUMMARY: Anticoagulation Episode Summary    Current INR goal:   2.0-3.0  TTR:   82.7 % (4.5 y)  Next INR check:   09/27/2016  INR from last check:   3.5! (09/13/2016)  Weekly max dose:     Target end date:   Indefinite  INR check location:   [facility]  Preferred lab:     Send INR reminders to:      Indications   Pulmonary embolism ([place]) [I26.99]       Comments:           ANTICOAG TODAY: Anticoagulation Summary  As of 09/13/2016   INR goal:   2.0-3.0  TTR:     Today's INR:   3.5!  Next INR check:   09/27/2016  Target end date:   Indefinite   Indications   Pulmonary embolism ([place]) [I26.99]        Anticoagulation Episode Summary    INR check location:   [facility]   Preferred lab:      Send INR reminders to:      Comments:         PATIENT INSTRUCTIONS: Patient instructed to take medications as defined in the Anti-coagulation Track section of this encounter.  Patient instructed to take  today's dose.  Patient verbalized understanding of these instructions.  Take 1 tablet every day except Friday. Take 1 and 1/2 tablets on Friday.  FOLLOW-UP Reduce dose to 37.5 mg weekly.  Take 1 tablet all days except Friday. Take 1 and 1/2 tablets on Fridays. Return in 2 weeks (on 09/27/2016) for INR check .  Uvaldo Bristle, PharmD PGY1 Pharmacy Resident Pager: [phone]

## 2016-09-14 LAB — CBC
Hematocrit: 33.4 % — ABNORMAL LOW (ref 37.5–51.0)
Hemoglobin: 10.7 g/dL — ABNORMAL LOW (ref 13.0–17.7)
MCH: 26.2 pg — ABNORMAL LOW (ref 26.6–33.0)
MCHC: 32 g/dL (ref 31.5–35.7)
MCV: 82 fL (ref 79–97)
Platelets: 318 10*3/uL (ref 150–379)
RBC: 4.08 x10E6/uL — ABNORMAL LOW (ref 4.14–5.80)
RDW: 14.7 % (ref 12.3–15.4)
WBC: 6.3 10*3/uL (ref 3.4–10.8)

## 2016-09-14 NOTE — Assessment & Plan Note (Signed)
  Assessment: Patient presents for follow-up epistaxis. He was seen in the ED for this on March 13. Epistaxis resolved however his hemoglobin was noted to be 11.7 down from 13.8 in August. He was having lightheadedness and tachycardia that resolved with IV fluids. He's had issues with epistaxis in the past and has been seen by Dr. Erik Obey with ENT and was told that nothing was noted to be done at that time. His INR in the ED was 3.4 and he has been taking his normal Coumadin regimen since then. He has been told in the past that he should try getting a humidfier which he has not done.   P: CBC checked this visit revealed hgb of 10.7 and INR was 3.5 He met with pharmacy to have his coumadin regimen adjusted today. No signs of active bleeding and he is asymptomatic. He has f/u with PCP next month, can get repeat CBC and retic count at that visit.

## 2016-09-15 NOTE — Progress Notes (Signed)
Indication: Venous thromboembolism. Duration: Indefinite per patient preference. INR: Above target. Agree with Dr. Julianne Rice assessment and plan.

## 2016-09-19 ENCOUNTER — Ambulatory Visit: Payer: Medicare HMO

## 2016-09-19 NOTE — Progress Notes (Signed)
Internal Medicine Clinic Attending  Case discussed with Dr. Truong at the time of the visit.  We reviewed the resident's history and exam and pertinent patient test results.  I agree with the assessment, diagnosis, and plan of care documented in the resident's note.  

## 2016-09-27 ENCOUNTER — Ambulatory Visit (INDEPENDENT_AMBULATORY_CARE_PROVIDER_SITE_OTHER): Payer: Medicare HMO | Admitting: Pharmacist

## 2016-09-27 DIAGNOSIS — I2782 Chronic pulmonary embolism: Secondary | ICD-10-CM | POA: Diagnosis not present

## 2016-09-27 DIAGNOSIS — Z7901 Long term (current) use of anticoagulants: Secondary | ICD-10-CM | POA: Diagnosis not present

## 2016-09-27 LAB — POCT INR: INR: 2.8

## 2016-09-27 NOTE — Progress Notes (Signed)
Indication: Venous thromboembolism. Duration: Indefinite per patient preference. INR: At target. Agree with Dr. Gladstone Pih assessment and plan.

## 2016-09-27 NOTE — Patient Instructions (Signed)
Patient instructed to take medications as defined in the Anti-coagulation Track section of this encounter.  Patient instructed to take today's dose.  Patient instructed to take one (1) of your 5mg  peach-colored warfarin tablets by mouth once-daily at [facility] each day.  Patient verbalized understanding of these instructions.

## 2016-09-27 NOTE — Progress Notes (Signed)
Anti-Coagulation Progress Note  Alexander English is a 61 y.o. male who is currently on an anti-coagulation regimen.    RECENT RESULTS: Recent results are below, the most recent result is correlated with a dose of 37.5 mg. per week: Lab Results  Component Value Date   INR 2.80 09/27/2016   INR 3.5 09/13/2016   INR 3.40 09/06/2016    ANTI-COAG DOSE: Anticoagulation Dose Instructions as of 09/27/2016      [name] Tue Wed Thu Fri Sat   New Dose 5 mg 5 mg 5 mg 5 mg 5 mg 5 mg 5 mg    Description   Take one (1) of your 5mg  peach-colored warfarin tablets by mouth daily at 6PM.       ANTICOAG SUMMARY: Anticoagulation Episode Summary    Current INR goal:   2.0-3.0  TTR:   82.2 % (4.5 y)  Next INR check:   10/24/2016  INR from last check:   2.80 (09/27/2016)  Weekly max dose:     Target end date:   Indefinite  INR check location:   [facility]  Preferred lab:     Send INR reminders to:      Indications   Pulmonary embolism ([place]) [I26.99]       Comments:           ANTICOAG TODAY: Anticoagulation Summary  As of 09/27/2016   INR goal:   2.0-3.0  TTR:     Today's INR:   2.80  Next INR check:   10/24/2016  Target end date:   Indefinite   Indications   Pulmonary embolism ([place]) [I26.99]        Anticoagulation Episode Summary    INR check location:   [facility]   Preferred lab:      Send INR reminders to:      Comments:         PATIENT INSTRUCTIONS: Patient Instructions  Patient instructed to take medications as defined in the Anti-coagulation Track section of this encounter.  Patient instructed to take today's dose.  Patient instructed to take one (1) of your 5mg  peach-colored warfarin tablets by mouth once-daily at [facility] each day.  Patient verbalized understanding of these instructions.       FOLLOW-UP Return in 4 weeks (on 10/24/2016) for Follow up INR at 1000h.  [name], III Pharm.D., CACP

## 2016-10-03 ENCOUNTER — Encounter: Payer: Self-pay | Admitting: Internal Medicine

## 2016-10-03 NOTE — Progress Notes (Signed)
The following is a letter composed on behalf of [name] on Teaching Program Letterhead:  October 03, 2016  RE: Alexander English  To Whom It May Concern,  My name is Alexander English, and I am Alexander English primary care physician.  He has informed me that he is required to drive to [facility] to address an issue with his passport.  I have advised him to avoid such long drives, if at all possible, as he has a history of blood clots that were felt to be brought on by prolonged sitting.  This was quite serious as it resulted in a significant stroke.  He has done well since, although he has been plagued by significant knee and back pain, also the consequence of the stroke.  This too would make a drive to [facility] rather uncomfortable for him, in of itself.  Is there flexibility in where he can address his passport issue, such as some place close to his home in [place]?  It would lessen the risks associated with such a prolonged trip to [place].  Thank you for your consideration.  Sincerely,   Alexander Cobble, MD, [place], [facility] Program Director and Chief [facility] Internal Medicine Residency Clinical Associate Professor of [place] [facility]

## 2016-10-20 ENCOUNTER — Telehealth: Payer: Self-pay | Admitting: Internal Medicine

## 2016-10-20 DIAGNOSIS — H35033 Hypertensive retinopathy, bilateral: Secondary | ICD-10-CM | POA: Diagnosis not present

## 2016-10-20 DIAGNOSIS — H2513 Age-related nuclear cataract, bilateral: Secondary | ICD-10-CM | POA: Diagnosis not present

## 2016-10-20 DIAGNOSIS — H47093 Other disorders of optic nerve, not elsewhere classified, bilateral: Secondary | ICD-10-CM | POA: Diagnosis not present

## 2016-10-20 DIAGNOSIS — H40023 Open angle with borderline findings, high risk, bilateral: Secondary | ICD-10-CM | POA: Diagnosis not present

## 2016-10-20 NOTE — Telephone Encounter (Signed)
APT. REMINDER CALL, LMTCB °

## 2016-10-21 ENCOUNTER — Ambulatory Visit (INDEPENDENT_AMBULATORY_CARE_PROVIDER_SITE_OTHER): Payer: Medicare HMO | Admitting: Pharmacist

## 2016-10-21 ENCOUNTER — Ambulatory Visit (INDEPENDENT_AMBULATORY_CARE_PROVIDER_SITE_OTHER): Payer: Medicare HMO | Admitting: Internal Medicine

## 2016-10-21 ENCOUNTER — Encounter: Payer: Self-pay | Admitting: Internal Medicine

## 2016-10-21 VITALS — BP 118/79 | HR 77 | Temp 97.1°F | Wt 234.7 lb

## 2016-10-21 DIAGNOSIS — I69354 Hemiplegia and hemiparesis following cerebral infarction affecting left non-dominant side: Secondary | ICD-10-CM | POA: Diagnosis not present

## 2016-10-21 DIAGNOSIS — Z7901 Long term (current) use of anticoagulants: Secondary | ICD-10-CM

## 2016-10-21 DIAGNOSIS — R040 Epistaxis: Secondary | ICD-10-CM

## 2016-10-21 DIAGNOSIS — M170 Bilateral primary osteoarthritis of knee: Secondary | ICD-10-CM | POA: Diagnosis not present

## 2016-10-21 DIAGNOSIS — N521 Erectile dysfunction due to diseases classified elsewhere: Secondary | ICD-10-CM | POA: Diagnosis not present

## 2016-10-21 DIAGNOSIS — L709 Acne, unspecified: Secondary | ICD-10-CM | POA: Insufficient documentation

## 2016-10-21 DIAGNOSIS — Z87891 Personal history of nicotine dependence: Secondary | ICD-10-CM

## 2016-10-21 DIAGNOSIS — N5201 Erectile dysfunction due to arterial insufficiency: Secondary | ICD-10-CM

## 2016-10-21 DIAGNOSIS — G8929 Other chronic pain: Secondary | ICD-10-CM

## 2016-10-21 DIAGNOSIS — I2692 Saddle embolus of pulmonary artery without acute cor pulmonale: Secondary | ICD-10-CM

## 2016-10-21 DIAGNOSIS — M172 Bilateral post-traumatic osteoarthritis of knee: Secondary | ICD-10-CM

## 2016-10-21 DIAGNOSIS — M545 Low back pain, unspecified: Secondary | ICD-10-CM

## 2016-10-21 DIAGNOSIS — N200 Calculus of kidney: Secondary | ICD-10-CM | POA: Diagnosis not present

## 2016-10-21 DIAGNOSIS — I82409 Acute embolism and thrombosis of unspecified deep veins of unspecified lower extremity: Secondary | ICD-10-CM

## 2016-10-21 DIAGNOSIS — Z0000 Encounter for general adult medical examination without abnormal findings: Secondary | ICD-10-CM

## 2016-10-21 DIAGNOSIS — I771 Stricture of artery: Secondary | ICD-10-CM

## 2016-10-21 DIAGNOSIS — Z86718 Personal history of other venous thrombosis and embolism: Secondary | ICD-10-CM

## 2016-10-21 DIAGNOSIS — E7849 Other hyperlipidemia: Secondary | ICD-10-CM

## 2016-10-21 DIAGNOSIS — E785 Hyperlipidemia, unspecified: Secondary | ICD-10-CM

## 2016-10-21 DIAGNOSIS — Z79899 Other long term (current) drug therapy: Secondary | ICD-10-CM

## 2016-10-21 DIAGNOSIS — N3941 Urge incontinence: Secondary | ICD-10-CM | POA: Diagnosis not present

## 2016-10-21 DIAGNOSIS — I6930 Unspecified sequelae of cerebral infarction: Secondary | ICD-10-CM

## 2016-10-21 LAB — POCT INR: INR: 2.6

## 2016-10-21 MED ORDER — BENZOYL PEROXIDE-ERYTHROMYCIN 5-3 % EX GEL: Freq: Two times a day (BID) | CUTANEOUS | 1 refills | Status: DC

## 2016-10-21 MED ORDER — SILDENAFIL CITRATE 100 MG PO TABS: 100.0000 mg | ORAL_TABLET | Freq: Every day | ORAL | 1 refills | Status: DC | PRN

## 2016-10-21 NOTE — Progress Notes (Signed)
Anti-Coagulation Progress Note  Alexander English is a 61 y.o. male who is currently on an anti-coagulation regimen.    RECENT RESULTS: Recent results are below, the most recent result is correlated with a dose of 35 mg. per week: Lab Results  Component Value Date   INR 2.60 10/21/2016   INR 2.80 09/27/2016   INR 3.5 09/13/2016    ANTI-COAG DOSE: Anticoagulation Dose Instructions as of 10/21/2016      [name] Tue Wed Thu Fri Sat   New Dose 5 mg 5 mg 5 mg 5 mg 5 mg 5 mg 5 mg    Description   Take one (1) of your 5mg  peach-colored warfarin tablets by mouth daily at 6PM.       ANTICOAG SUMMARY: Anticoagulation Episode Summary    Current INR goal:   2.0-3.0  TTR:   82.5 % (4.6 y)  Next INR check:   12/26/2016  INR from last check:   2.60 (10/21/2016)  Weekly max dose:     Target end date:   Indefinite  INR check location:   [facility]  Preferred lab:     Send INR reminders to:      Indications   Pulmonary embolism ([place]) [I26.99]       Comments:           ANTICOAG TODAY: Anticoagulation Summary  As of 10/21/2016   INR goal:   2.0-3.0  TTR:     Today's INR:   2.60  Next INR check:   12/26/2016  Target end date:   Indefinite   Indications   Pulmonary embolism ([place]) [I26.99]        Anticoagulation Episode Summary    INR check location:   [facility]   Preferred lab:      Send INR reminders to:      Comments:         PATIENT INSTRUCTIONS: Patient Instructions  Patient instructed to take medications as defined in the Anti-coagulation Track section of this encounter.  Patient instructed to take today's dose.  Patient instructed to take 1 tablet of his peach-colored 5mg  strength warfarin tablets by mouth, once-daily, at [facility] each day. Patient instructed that during his visit to [place], should he experience any signs or symptoms of bleeding as discussed, to seek medical care while in [place] for the next 2 months.  Patient verbalized understanding of  these instructions.       FOLLOW-UP Return in 2 months (on 12/26/2016) for Follow up INR at 0900h.  [name], III Pharm.D., CACP

## 2016-10-21 NOTE — Assessment & Plan Note (Signed)
Assessment  He was able to get a brace for his left leg that he could maneuver with one hand. This has been very helpful in managing his knee pain. That being said, he still has pain in his back that starts at the left hip and radiates posteriorly and medially. He has a hard time describing the pain and cannot decide whether it is a burning pain or an ache. It occurs when he stands or ambulates and improves with sitting down. He has not tried anything for the pain as of yet.  Plan  The knee brace is an improvement and should preserve his knees longer. The back pain may have occurred secondary to a long-standing antalgic gait. He will try cyclobenzaprine that he has at home to see if this helps with the pain in case it is musculoskeletal. He was also advised to try Tylenol as needed for the pain and was informed this may slightly impact his INR so he should let Dr. Elie Confer know should there be changes in the INRs. We will reassess the efficacy of the cyclobenzaprine for Tylenol at the follow-up visit.

## 2016-10-21 NOTE — Assessment & Plan Note (Signed)
Assessment  It appears the back pain has worsened slightly since he has the new brace that he can utilize. I suspect this is a manifestation of increased ambulation given the brace rather than a worsening or progression of the back pain. It is difficult to characterize the back pain as he has trouble describing the pain. It could be musculoskeletal or neuropathic given his description.  Plan  He will try cyclobenzaprine that he has at home in case it is musculoskeletal pain. He will also try as needed Tylenol to see if this helps with the back pain associated with ambulation or standing. We will reassess the efficacy of either of these interventions at the follow-up visit.

## 2016-10-21 NOTE — Assessment & Plan Note (Signed)
Assessment  Although he is able to have erections he states he's unable to maintain them. He has a new girlfriend in [place] and is returning for a visit in about 10 days. He therefore inquired whether or not he may benefit from Viagra. I told him that he possibly may and that is it is worth a try as I do not see any contraindications to this therapy.  Plan  He was given Viagra 100 mg by mouth every 24 hours as needed dispense #15. He is aware of the costs. We will reassess the efficacy of this therapy at the follow-up visit.

## 2016-10-21 NOTE — Progress Notes (Signed)
   Subjective:    Patient ID: Alexander English, male    DOB: Apr 13, 1956, 61 y.o.   MRN: [id number]  HPI  Alexander English is here for follow-up of his history of stroke with residual deficit, history of venous thromboembolism on chronic anticoagulation, osteoarthritis of the knee, chronic back pain, and recurrent epistaxsis. Please see the A&P for the status of the pt's chronic medical problems.  Review of Systems  Constitutional: Negative for activity change and unexpected weight change.  HENT: Positive for nosebleeds.   Respiratory: Negative for chest tightness and shortness of breath.   Cardiovascular: Negative for chest pain.  Gastrointestinal: Negative for abdominal distention, abdominal pain, constipation, diarrhea, nausea and vomiting.  Genitourinary: Negative for urgency.  Musculoskeletal: Positive for arthralgias, back pain, gait problem and myalgias. Negative for joint swelling.  Skin: Negative for rash and wound.  Neurological: Positive for weakness.       Residual from prior CVA, unchanged      Objective:   Physical Exam  Constitutional: He is oriented to person, place, and time. He appears well-developed and well-nourished. No distress.  HENT:  Head: Normocephalic and atraumatic.  Eyes: Conjunctivae are normal. Right eye exhibits no discharge. Left eye exhibits no discharge. No scleral icterus.  Musculoskeletal:  Residual left upper and lower extremity weakness, unchanged.  Neurological: He is alert and oriented to person, place, and time.  Skin: Skin is warm and dry. No rash noted. He is not diaphoretic. No erythema.  Psychiatric: He has a normal mood and affect. His behavior is normal. Judgment and thought content normal.  Nursing note and vitals reviewed.     Assessment & Plan:   Please see problem oriented charting.

## 2016-10-21 NOTE — Assessment & Plan Note (Signed)
Assessment  Other than the recurrent epistaxes he has been tolerating the anticoagulation well. He has had no signs or symptoms suggestive of recurrent venous thromboembolism. He has made the informed decision to continue with the Coumadin as he wants to avoid any further venous thromboemboli or possible cryptogenic stroke. This is particularly important given the fact that within the next 10 days he will be leaving for [place] and the requisite long flight.  Plan  His INR was checked today and is within the target range. He will continue on his current Coumadin regimen and continue to follow-up in the anticoagulation clinic with Dr. Elie Confer.

## 2016-10-21 NOTE — Assessment & Plan Note (Signed)
Assessment  He is tolerating the atorvastatin 10 mg by mouth daily without myalgias.  Plan  We will continue with his high intensity statin and reassess for intolerances at the follow-up visit.

## 2016-10-21 NOTE — Assessment & Plan Note (Signed)
Assessment  For approximately the last month he has noted a whitehead on the frenulum between his nostrils. He states this occasionally will pop in bleed. He believes this is contributing to his epistaxis.  Plan  I have prescribed benzyl peroxide-erythromycin ointment to be placed on this area twice daily in hopes of improving the acne and therefore decreasing the bleeding and the infection risk associated with a pustule in this area. We will reassess the efficacy of this therapy at the follow-up visit.

## 2016-10-21 NOTE — Assessment & Plan Note (Signed)
Assessment  His urge incontinence is markedly improved on the Myrbetriq.  Plan  We will continue with the Mybetriq given its clinical efficacy and reassess at the follow-up visit.

## 2016-10-21 NOTE — Patient Instructions (Signed)
It was good to see you again.  I am glad you got the special brace.  Have fun in [place]!  1) I prescribed a gel for you acne.  Give it a try.  2) I prescribed Viagra 100 mg as needed.  3) Try tylenol for your back pain.  You can also try flexeril (cyclobenzaprine) to see if that helps.  4) Keep trying to use the humidifier and the nasal saline spray for the nose.  I will see you in 6 months, sooner if necessary.

## 2016-10-21 NOTE — Assessment & Plan Note (Signed)
Assessment  He continues to have episodic epistaxis. He was seen by ENT at [facility] and they found no anatomical abnormalities to explain this. Thus, it is likely related to decreased humidity with drying of the nasal mucosa and subsequent bleeding. The fact that he is on chronic Coumadin does not help in mitigating this problem.  Plan  He has since purchased a humidifier which he is now using. He also continues with the may saline nasal spray to keep the nasal mucosa moist. We will reassess the efficacy of these interventions on the episodic epistaxis at the follow-up visit.

## 2016-10-21 NOTE — Progress Notes (Signed)
Indication: Venous thromboembolism. Duration: Indefinite per patient's informed decision. INR: At target. Agree with Dr. Gladstone Pih assessment and plan.

## 2016-10-21 NOTE — Assessment & Plan Note (Signed)
He is up-to-date on his health care maintenance. 

## 2016-10-21 NOTE — Assessment & Plan Note (Signed)
Assessment  He continues to have left upper and lower extremity weakness from his prior CVA. These are unchanged from baseline.  Plan  He now has a brace for his left lower extremity that he can place with one hand. This has allowed for stabilization of that leg when he ambulates. We will reassess the efficacy of the brace and its ability to allow for better ambulation and a decrease in his antalgic gait at the follow-up visit.

## 2016-10-21 NOTE — Patient Instructions (Signed)
Patient instructed to take medications as defined in the Anti-coagulation Track section of this encounter.  Patient instructed to take today's dose.  Patient instructed to take 1 tablet of his peach-colored 5mg  strength warfarin tablets by mouth, once-daily, at [facility] each day. Patient instructed that during his visit to [place], should he experience any signs or symptoms of bleeding as discussed, to seek medical care while in [place] for the next 2 months.  Patient verbalized understanding of these instructions.

## 2016-10-21 NOTE — Assessment & Plan Note (Signed)
Assessment  He has had no further episodes of gross hematuria since his lithotripsy last year.  Plan  We will continue to monitor for evidence of recurrent nephrolithiasis.

## 2016-10-24 ENCOUNTER — Ambulatory Visit: Payer: Medicare HMO

## 2016-10-24 ENCOUNTER — Telehealth: Payer: Self-pay | Admitting: Internal Medicine

## 2016-10-24 DIAGNOSIS — L709 Acne, unspecified: Secondary | ICD-10-CM

## 2016-10-24 MED ORDER — BENZOYL PEROXIDE 5 % EX LIQD: Freq: Two times a day (BID) | CUTANEOUS | 11 refills | Status: DC

## 2016-10-24 NOTE — Telephone Encounter (Signed)
Stopped benzamycin Gel and started benzoyl peroxide 5% lotion twice daily instead.

## 2016-10-24 NOTE — Telephone Encounter (Signed)
Patient would like a call back about his Medication.

## 2016-10-24 NOTE — Telephone Encounter (Signed)
Per pharmacy-rx for Benzamycin Gel (erythromycin and benzoyl peroxide)  has to be mixed with 70% ethyl alcohol which the pharmacy is unable to obtain.  Pharmacy suggested changing rx to BenzaClin Topical Gel (clindamycin and benzoyl peroxide) as this rx can be mixed with water.  Will route to pcp for review, please advise.Regenia Skeeter, Edyn Popoca Cassady4/30/201811:47 AM

## 2016-10-25 NOTE — Telephone Encounter (Signed)
Patient informed.  No further action needed, phone call complete.Despina Hidden Cassady5/1/20182:48 PM

## 2016-12-26 ENCOUNTER — Encounter: Payer: Self-pay | Admitting: Internal Medicine

## 2016-12-26 ENCOUNTER — Ambulatory Visit (INDEPENDENT_AMBULATORY_CARE_PROVIDER_SITE_OTHER): Payer: Medicare HMO | Admitting: Pharmacist

## 2016-12-26 ENCOUNTER — Ambulatory Visit (INDEPENDENT_AMBULATORY_CARE_PROVIDER_SITE_OTHER): Payer: Medicare HMO | Admitting: Internal Medicine

## 2016-12-26 VITALS — BP 133/84 | HR 88 | Temp 98.2°F | Wt 241.8 lb

## 2016-12-26 DIAGNOSIS — Z87891 Personal history of nicotine dependence: Secondary | ICD-10-CM

## 2016-12-26 DIAGNOSIS — J069 Acute upper respiratory infection, unspecified: Secondary | ICD-10-CM | POA: Insufficient documentation

## 2016-12-26 DIAGNOSIS — Z86718 Personal history of other venous thrombosis and embolism: Secondary | ICD-10-CM

## 2016-12-26 DIAGNOSIS — Z7901 Long term (current) use of anticoagulants: Secondary | ICD-10-CM | POA: Diagnosis not present

## 2016-12-26 LAB — POCT INR: INR: 2.6

## 2016-12-26 NOTE — Patient Instructions (Signed)
Mr. Gramling,  For your symptoms please take Delsym, saline nasal spray and Tylenol. If your symptoms do not improve or get worse in the next few days please call to be seen in the acute care clinic.

## 2016-12-26 NOTE — Patient Instructions (Signed)
Patient instructed to take medications as defined in the Anti-coagulation Track section of this encounter.  Patient instructed to take today's dose.  Patient instructed to take one (1) of his 5mg  peach-colored warfarin tablets by mouth, once-daily at [facility] each day.  Patient verbalized understanding of these instructions.

## 2016-12-26 NOTE — Progress Notes (Signed)
INTERNAL MEDICINE TEACHING ATTENDING ADDENDUM - [name], DO Duration- indefinite per patient preference/informed decision , Indication- VTE, INR-  Lab Results  Component Value Date   INR 2.60 12/26/2016  . Agree with pharmacy recommendations as outlined in their note.

## 2016-12-26 NOTE — Progress Notes (Signed)
   CC: Generalized Malaise  HPI:  [name] is a 61 y.o. with history noted below the presents to the acute care clinic for generalized malaise. He reports that last week on Thursday he developed symptoms of body aches, nonproductive cough and runny nose.  He states that last Wednesday he returned from a two-month trip to [place]. He denies symptoms of sickness during the two months in [place].  He reports taking medication prophylactically for malaria and traveler's diarrhea.  Today he denies fever/chills, nausea/vomiting or headache.  He has not tried anything for symptomatic relief of his current symptoms.    Past Medical History:  Diagnosis Date  . Bilateral cataracts 05/18/2016   Not visually significant  . Chronic constipation 06/12/2013  . Chronic low back pain 06/12/2013   L4-5 facet arthropathy   . Chronic pain of both shoulders 12/07/2013   A/C joint osteoarthritis   . Cluster headaches    Resolved in 2006  . Dry eye syndrome, bilateral 05/18/2016  . Embolic stroke involving right middle cerebral artery ([place]) 08/08/2004   Occured after traveling from [place] to [place] where he was hospitalized for 1 month with no further work-up or therapy.  Returned to [place] unable to walk and was admitted to [facility] immediately after landing. Presumed embolic per neuro but TEE, bubble study, and hypercoag W/U negative. On indefinite coumadin per patient's informed preference.  Residual effects : Left spastic hemiplegia. Tx with phenol tibi  . Hyperplastic colon polyp 07/27/2012   Excised endoscopically 07/27/2012  . Hypertensive retinopathy of both eyes 05/18/2016  . Left nephrolithiasis 08/31/2015   With mild left hydronephrosis.  Scheduled to undergo shockwave lithotripsy.  . Obesity (BMI 30.0-34.9) 12/07/2013  . Osteoarthritis of both knees 01/26/2012  . Positive PPD 12/07/2013  . Pulmonary embolism ([place]) 09/30/2004   Diagnosed in April 2006 after returning from [place].  Likely provoked as it  occurred after a 12 hour plane flight within 2 months of R MCA CVA with left hemiparesis. Patient has made an informed decision to remain on lifelong warfarin.   . Urge incontinence of urine 08/31/2015   Possibly secondary to prior CVA.  Treated with Myrbetriq.    Review of Systems:  Review of Systems  Constitutional: Positive for malaise/fatigue. Negative for chills and fever.  Respiratory: Positive for cough.   Gastrointestinal: Negative for abdominal pain, nausea and vomiting.  Musculoskeletal: Positive for myalgias.  Neurological: Negative for headaches.     Physical Exam:  Vitals:   12/26/16 0935  BP: 133/84  Pulse: 88  Temp: 98.2 F (36.8 C)  TempSrc: Oral  SpO2: 96%  Weight: 241 lb 12.8 oz (109.7 kg)   Physical Exam  Constitutional: He is well-developed, well-nourished, and in no distress.  HENT:  Mouth/Throat: Oropharynx is clear and moist.  Eyes: Conjunctivae are normal.  Cardiovascular: Normal rate, regular rhythm and normal heart sounds.  Exam reveals no gallop and no friction rub.   No murmur heard. Pulmonary/Chest: Effort normal and breath sounds normal. No respiratory distress. He has no wheezes. He has no rales.  Musculoskeletal: He exhibits no edema.  Skin: Skin is warm and dry.    Assessment & Plan:   See encounters tab for problem based medical decision making.    Patient discussed with Dr. Daryll Drown

## 2016-12-26 NOTE — Progress Notes (Signed)
Anticoagulation Management Alexander English is a 61 y.o. male who reports to the clinic for monitoring of warfarin treatment.    Indication: DVT , history of  Duration: indefinite Supervising physician: Joni Reining  Anticoagulation Clinic Visit History: Patient does not report signs/symptoms of bleeding or thromboembolism  Other recent changes: No diet, medications, lifestyle changes endorsed by patient.  Anticoagulation Episode Summary    Current INR goal:   2.0-3.0  TTR:   83.2 % (4.8 y)  Next INR check:   01/23/2017  INR from last check:   2.60 (12/26/2016)  Weekly max warfarin dose:     Target end date:   Indefinite  INR check location:   [facility]  Preferred lab:     Send INR reminders to:      Indications   History of venous thromboembolism [Z86.718]       Comments:          ASSESSMENT Recent Results: The most recent result is correlated with 35 mg per week: Lab Results  Component Value Date   INR 2.60 12/26/2016   INR 2.60 10/21/2016   INR 2.80 09/27/2016    Anticoagulation Dosing: INR as of 12/26/2016 and Previous Warfarin Dosing Information    INR Dt INR Goal [employer] Sun Mon Tue Wed Thu Fri Sat   12/26/2016 2.60 2.0-3.0 35 mg 5 mg 5 mg 5 mg 5 mg 5 mg 5 mg 5 mg    Previous description   Take one (1) of your 5mg  peach-colored warfarin tablets by mouth daily at 6PM.    Anticoagulation Warfarin Dose Instructions as of 12/26/2016      Total Sun Mon Tue Wed Thu Fri Sat   New Dose 35 mg 5 mg 5 mg 5 mg 5 mg 5 mg 5 mg 5 mg     (5 mg x 1)  (5 mg x 1)  (5 mg x 1)  (5 mg x 1)  (5 mg x 1)  (5 mg x 1)  (5 mg x 1)                         Description   Take one (1) of your 5mg  peach-colored warfarin tablets by mouth daily at [facility].      INR today: Therapeutic  PLAN Weekly dose was unchanged   Patient Instructions  Patient instructed to take medications as defined in the Anti-coagulation Track section of this encounter.  Patient instructed to take  today's dose.  Patient instructed to take one (1) of his 5mg  peach-colored warfarin tablets by mouth, once-daily at [facility] each day.  Patient verbalized understanding of these instructions.     Patient advised to contact clinic or seek medical attention if signs/symptoms of bleeding or thromboembolism occur.  Patient verbalized understanding by repeating back information and was advised to contact me if further medication-related questions arise. Patient was also provided an information handout.  Follow-up Return in 4 weeks (on 01/23/2017) for Follow up INR at 0915h.  Caryl Bis PharmD, CACP, CPP  15 minutes spent face-to-face with the patient during the encounter. 50% of time spent on education. 50% of time was spent on finger-stick, point of care INR specimen collection, processing, interpretation, data entry into EPIC/CHL and [email or web address]

## 2016-12-26 NOTE — Assessment & Plan Note (Addendum)
Assessment: Upper respiratory infection  Patient visited [place] for 2 months, returning last wednesday but states he felt well during his trip.  The following day patient developed a cough, runny nose and body aches. Patient likely has a viral upper respiratory infection. Will obtain blood work as there is concern for malaria and dengue.  Will treat currently symptomatically with delsym, nasal saline spray, and Tylenol. Patient was instructed to return if symptoms do not improve or became worse.  Plan: Delsym, tylenol and nasal saline spray CBC and CMET with blood smear

## 2016-12-27 LAB — CMP14 + ANION GAP
ALT: 23 IU/L (ref 0–44)
AST: 31 IU/L (ref 0–40)
Albumin/Globulin Ratio: 1 — ABNORMAL LOW (ref 1.2–2.2)
Albumin: 4.1 g/dL (ref 3.6–4.8)
Alkaline Phosphatase: 83 IU/L (ref 39–117)
Anion Gap: 16 mmol/L (ref 10.0–18.0)
BUN/Creatinine Ratio: 9 — ABNORMAL LOW (ref 10–24)
BUN: 8 mg/dL (ref 8–27)
Bilirubin Total: 0.6 mg/dL (ref 0.0–1.2)
CO2: 19 mmol/L — ABNORMAL LOW (ref 20–29)
Calcium: 9.4 mg/dL (ref 8.6–10.2)
Chloride: 106 mmol/L (ref 96–106)
Creatinine, Ser: 0.88 mg/dL (ref 0.76–1.27)
GFR calc Af Amer: 106 mL/min/{1.73_m2} (ref >59)
GFR calc non Af Amer: 92 mL/min/{1.73_m2} (ref >59)
Globulin, Total: 4 g/dL (ref 1.5–4.5)
Glucose: 92 mg/dL (ref 65–99)
Potassium: 3.9 mmol/L (ref 3.5–5.2)
Sodium: 141 mmol/L (ref 134–144)
Total Protein: 8.1 g/dL (ref 6.0–8.5)

## 2016-12-27 LAB — CBC WITH DIFFERENTIAL/PLATELET
Basophils Absolute: 0 10*3/uL (ref 0.0–0.2)
Basos: 0 %
EOS (ABSOLUTE): 0.6 10*3/uL — ABNORMAL HIGH (ref 0.0–0.4)
Eos: 11 %
Hematocrit: 39.6 % (ref 37.5–51.0)
Hemoglobin: 12.6 g/dL — ABNORMAL LOW (ref 13.0–17.7)
Immature Grans (Abs): 0 10*3/uL (ref 0.0–0.1)
Immature Granulocytes: 0 %
Lymphocytes Absolute: 2 10*3/uL (ref 0.7–3.1)
Lymphs: 42 %
MCH: 24.2 pg — ABNORMAL LOW (ref 26.6–33.0)
MCHC: 31.8 g/dL (ref 31.5–35.7)
MCV: 76 fL — ABNORMAL LOW (ref 79–97)
Monocytes Absolute: 0.4 10*3/uL (ref 0.1–0.9)
Monocytes: 9 %
Neutrophils Absolute: 1.8 10*3/uL (ref 1.4–7.0)
Neutrophils: 38 %
Platelets: 222 10*3/uL (ref 150–379)
RBC: 5.2 x10E6/uL (ref 4.14–5.80)
RDW: 16.1 % — ABNORMAL HIGH (ref 12.3–15.4)
WBC: 4.8 10*3/uL (ref 3.4–10.8)

## 2016-12-30 LAB — PATHOLOGIST SMEAR REVIEW
Basophils Absolute: 0 10*3/uL (ref 0.0–0.2)
Basos: 0 %
EOS (ABSOLUTE): 0.6 10*3/uL — ABNORMAL HIGH (ref 0.0–0.4)
Eos: 12 %
Hematocrit: 39.8 % (ref 37.5–51.0)
Hemoglobin: 12.5 g/dL — ABNORMAL LOW (ref 13.0–17.7)
Immature Grans (Abs): 0 10*3/uL (ref 0.0–0.1)
Immature Granulocytes: 0 %
Lymphocytes Absolute: 2 10*3/uL (ref 0.7–3.1)
Lymphs: 41 %
MCH: 23.8 pg — ABNORMAL LOW (ref 26.6–33.0)
MCHC: 31.4 g/dL — ABNORMAL LOW (ref 31.5–35.7)
MCV: 76 fL — ABNORMAL LOW (ref 79–97)
Monocytes Absolute: 0.5 10*3/uL (ref 0.1–0.9)
Monocytes: 9 %
Neutrophils Absolute: 1.9 10*3/uL (ref 1.4–7.0)
Neutrophils: 38 %
Path Rev PLTs: NORMAL
Platelets: 204 10*3/uL (ref 150–379)
RBC: 5.26 x10E6/uL (ref 4.14–5.80)
RDW: 15.9 % — ABNORMAL HIGH (ref 12.3–15.4)
WBC: 5 10*3/uL (ref 3.4–10.8)

## 2017-01-02 NOTE — Progress Notes (Signed)
Internal Medicine Clinic Attending  Case discussed with Dr. Hoffman at the time of the visit.  We reviewed the resident's history and exam and pertinent patient test results.  I agree with the assessment, diagnosis, and plan of care documented in the resident's note.  

## 2017-01-23 ENCOUNTER — Ambulatory Visit: Payer: Medicare HMO

## 2017-01-30 ENCOUNTER — Ambulatory Visit (INDEPENDENT_AMBULATORY_CARE_PROVIDER_SITE_OTHER): Payer: Medicare HMO | Admitting: Pharmacist

## 2017-01-30 DIAGNOSIS — Z7901 Long term (current) use of anticoagulants: Secondary | ICD-10-CM

## 2017-01-30 DIAGNOSIS — Z86718 Personal history of other venous thrombosis and embolism: Secondary | ICD-10-CM

## 2017-01-30 DIAGNOSIS — Z5181 Encounter for therapeutic drug level monitoring: Secondary | ICD-10-CM

## 2017-01-30 LAB — POCT INR: INR: 2.2

## 2017-01-30 NOTE — Patient Instructions (Signed)
Patient instructed to take medications as defined in the Anti-coagulation Track section of this encounter.  Patient instructed to take today's dose.  Patient instructed to take one (1) of your 5mg  peach-colored warfarin tablets by mouth daily at [facility]. Patient verbalized understanding of these instructions.

## 2017-01-30 NOTE — Progress Notes (Signed)
Anticoagulation Management Alexander English is a 61 y.o. male who reports to the clinic for monitoring of warfarin treatment.    Indication: DVT, history of; long term use of anticoagulants. Duration: indefinite Supervising physician: [facility] Visit History: Patient does not report signs/symptoms of bleeding or thromboembolism  Other recent changes: No diet, medications, lifestyle changes endorsed by the patient.  Anticoagulation Episode Summary    Current INR goal:   2.0-3.0  TTR:   83.5 % (4.9 y)  Next INR check:   03/06/2017  INR from last check:   2.20 (01/30/2017)  Weekly max warfarin dose:     Target end date:   Indefinite  INR check location:   [facility]  Preferred lab:     Send INR reminders to:      Indications   History of venous thromboembolism [Z86.718]       Comments:           No Known Allergies Prior to Admission medications   Medication Sig Start Date End Date Taking? Authorizing Provider  aspirin EC 81 MG tablet Take 81 mg by mouth daily.   Yes [provider]  atorvastatin (LIPITOR) 10 MG tablet Take 1 tablet (10 mg total) by mouth daily. 05/27/16  Yes [name], MD  benzoyl peroxide 5 % external liquid Apply topically 2 (two) times daily. 10/24/16  Yes [name], MD  mirabegron ER (MYRBETRIQ) 25 MG TB24 tablet Take 25 mg by mouth daily.   Yes [provider]  Multiple Vitamin (MULTIVITAMIN) tablet Take 1 tablet by mouth daily.   Yes [provider]  sildenafil (VIAGRA) 100 MG tablet Take 1 tablet (100 mg total) by mouth daily as needed for erectile dysfunction. 10/21/16  Yes [name], MD  warfarin (COUMADIN) 5 MG tablet Take 1 tablet (5 mg) on Sundays, Tuesday, Thursdays, and Saturdays and take 1 1/2 tablets (7.5 mg) on Mondays, Wednesdays, and Fridays 09/05/16  Yes [name], MD   Past Medical History:  Diagnosis Date  . Bilateral cataracts 05/18/2016   Not visually  significant  . Chronic constipation 06/12/2013  . Chronic low back pain 06/12/2013   L4-5 facet arthropathy   . Chronic pain of both shoulders 12/07/2013   A/C joint osteoarthritis   . Cluster headaches    Resolved in 2006  . Dry eye syndrome, bilateral 05/18/2016  . Embolic stroke involving right middle cerebral artery ([place]) 08/08/2004   Occured after traveling from [place] to [place] where he was hospitalized for 1 month with no further work-up or therapy.  Returned to [place] unable to walk and was admitted to [facility] immediately after landing. Presumed embolic per neuro but TEE, bubble study, and hypercoag W/U negative. On indefinite coumadin per patient's informed preference.  Residual effects : Left spastic hemiplegia. Tx with phenol tibi  . Hyperplastic colon polyp 07/27/2012   Excised endoscopically 07/27/2012  . Hypertensive retinopathy of both eyes 05/18/2016  . Left nephrolithiasis 08/31/2015   With mild left hydronephrosis.  Scheduled to undergo shockwave lithotripsy.  . Obesity (BMI 30.0-34.9) 12/07/2013  . Osteoarthritis of both knees 01/26/2012  . Positive PPD 12/07/2013  . Pulmonary embolism ([place]) 09/30/2004   Diagnosed in April 2006 after returning from [place].  Likely provoked as it occurred after a 12 hour plane flight within 2 months of R MCA CVA with left hemiparesis. Patient has made an informed decision to remain on lifelong warfarin.   . Urge incontinence of urine 08/31/2015   Possibly  secondary to prior CVA.  Treated with Myrbetriq.   Social History   Social History  . Marital status: Divorced    Spouse name: N/A  . Number of children: N/A  . Years of education: N/A   Social History Main Topics  . Smoking status: Former Smoker    Quit date: 06/28/2007  . Smokeless tobacco: Never Used  . Alcohol use No     Comment: Remote past  . Drug use: No  . Sexual activity: Not on file   Other Topics Concern  . Not on file   Social History Narrative   Born in [place] but has been in the  [place] for > 30 years.  Divorced, 1 daughter and 2 sons.  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   Family History  Problem Relation Age of Onset  . Stroke Maternal Grandmother   . Unexplained death Mother   . Depression Mother        After husband's death  . Unexplained death Father   . Osteoarthritis Father        Knees  . Early death Sister   . Seizures Sister   . Early death Brother 72       Unknown cause  . Healthy Daughter   . Healthy Son   . Stroke Maternal Aunt   . Healthy Sister   . Healthy Sister   . Unexplained death Brother   . Healthy Brother   . Healthy Son     ASSESSMENT Recent Results: The most recent result is correlated with 35 mg per week: Lab Results  Component Value Date   INR 2.20 01/30/2017   INR 2.60 12/26/2016   INR 2.60 10/21/2016    Anticoagulation Dosing: INR as of 01/30/2017 and Previous Warfarin Dosing Information    INR Dt INR Goal [employer] Sun Mon Tue Wed Thu Fri Sat   01/30/2017 2.20 2.0-3.0 35 mg 5 mg 5 mg 5 mg 5 mg 5 mg 5 mg 5 mg    Previous description   Take one (1) of your 5mg  peach-colored warfarin tablets by mouth daily at 6PM.    Anticoagulation Warfarin Dose Instructions as of 01/30/2017      Total Sun Mon Tue Wed Thu Fri Sat   New Dose 35 mg 5 mg 5 mg 5 mg 5 mg 5 mg 5 mg 5 mg     (5 mg x 1)  (5 mg x 1)  (5 mg x 1)  (5 mg x 1)  (5 mg x 1)  (5 mg x 1)  (5 mg x 1)                         Description   Take one (1) of your 5mg  peach-colored warfarin tablets by mouth daily at [facility].      INR today: Therapeutic  PLAN Weekly dose was unchanged .  Patient Instructions  Patient instructed to take medications as defined in the Anti-coagulation Track section of this encounter.  Patient instructed to take today's dose.  Patient instructed to take one (1) of your 5mg  peach-colored warfarin tablets by mouth daily at [facility]. Patient verbalized understanding of these instructions.      Patient advised to contact clinic or seek medical attention if signs/symptoms of bleeding or thromboembolism occur.  Patient verbalized understanding by repeating back information and was advised to contact me if further medication-related questions arise. Patient  was also provided an information handout.  Follow-up Return in 5 weeks (on 03/06/2017) for Follow up INR at 0845h.  Caryl Bis PharmD, CACP, CPP  15 minutes spent face-to-face with the patient during the encounter. 50% of time spent on education. 50% of time was spent on fingerstick point of care INR sample collection, processing, results interpretation and documentation in [email or web address].

## 2017-02-02 NOTE — Progress Notes (Signed)
I reviewed Dr. Gladstone Pih note.  Patient is on [facility] for VTE.  INR at goal.

## 2017-02-21 ENCOUNTER — Other Ambulatory Visit: Payer: Self-pay | Admitting: Pharmacist

## 2017-02-21 DIAGNOSIS — Z86711 Personal history of pulmonary embolism: Secondary | ICD-10-CM

## 2017-02-21 MED ORDER — WARFARIN SODIUM 5 MG PO TABS: 5.0000 mg | ORAL_TABLET | Freq: Every day | ORAL | 0 refills | Status: DC

## 2017-03-06 ENCOUNTER — Ambulatory Visit: Payer: Medicare HMO

## 2017-03-13 ENCOUNTER — Ambulatory Visit: Payer: Medicare HMO

## 2017-03-20 ENCOUNTER — Ambulatory Visit (INDEPENDENT_AMBULATORY_CARE_PROVIDER_SITE_OTHER): Payer: Medicare HMO | Admitting: Pharmacist

## 2017-03-20 DIAGNOSIS — Z7901 Long term (current) use of anticoagulants: Secondary | ICD-10-CM | POA: Diagnosis not present

## 2017-03-20 DIAGNOSIS — Z86718 Personal history of other venous thrombosis and embolism: Secondary | ICD-10-CM

## 2017-03-20 LAB — POCT INR: INR: 2.3

## 2017-03-20 NOTE — Progress Notes (Signed)
Anticoagulation Management Alexander English is a 62 y.o. male who reports to the clinic for monitoring of warfarin treatment.    Indication: DVT, history of; long term anticoagulation with warfarin for secondary prevention of recurrence of VTE.  Duration: indefinite Supervising physician: Aldine Contes  Anticoagulation Clinic Visit History: Patient does not report signs/symptoms of bleeding or thromboembolism  Other recent changes: No diet, medications, lifestyle changes endorsed by the patient to me.  Anticoagulation Episode Summary    Current INR goal:   2.0-3.0  TTR:   83.9 % (5 y)  Next INR check:   04/17/2017  INR from last check:   2.30 (03/20/2017)  Weekly max warfarin dose:     Target end date:   Indefinite  [facility]R check location[facility]   [facility]  Preferred lab:     Send INR reminders to:      Indications   History of venous thromboembolism [Z86.718]       Comments:           No Known Allergies Prior to Admission medications   Medication Sig Start Date End Date Taking? Authorizing Provider  aspirin EC 81 MG tablet Take 81 mg by mouth daily.   Yes [provider]  atorvastatin (LIPITOR) 10 MG tablet Take 1 tablet (10 mg total) by mouth daily. 05/27/16  Yes [name], MD  benzoyl peroxide 5 % external liquid Apply topically 2 (two) times daily. 10/24/16  Yes [name], MD  mirabegron ER (MYRBETRIQ) 25 MG TB24 tablet Take 25 mg by mouth daily.   Yes [provider]  Multiple Vitamin (MULTIVITAMIN) tablet Take 1 tablet by mouth daily.   Yes [provider]  sildenafil (VIAGRA) 100 MG tablet Take 1 tablet (100 mg total) by mouth daily as needed for erectile dysfunction. 10/21/16  Yes [name], MD  warfarin (COUMADIN) 5 MG tablet Take 1 tablet (5 mg total) by mouth daily at 6 PM. 02/21/17 05/22/17 Yes [name], DO   Past Medical History:  Diagnosis Date  . Bilateral cataracts 05/18/2016   Not visually significant  .  Chronic constipation 06/12/2013  . Chronic low back pain 06/12/2013   L4-5 facet arthropathy   . Chronic pain of both shoulders 12/07/2013   A/C joint osteoarthritis   . Cluster headaches    Resolved in 2006  . Dry eye syndrome, bilateral 05/18/2016  . Embolic stroke involving right middle cerebral artery ([place]) 08/08/2004   Occured after traveling from [place] to [place] where he was hospitalized for 1 month with no further work-up or therapy.  Returned to [place] unable to walk and was admitted to [facility] immediately after landing. Presumed embolic per neuro but TEE, bubble study, and hypercoag W/U negative. On indefinite coumadin per patient's informed preference.  Residual effects : Left spastic hemiplegia. Tx with phenol tibi  . Hyperplastic colon polyp 07/27/2012   Excised endoscopically 07/27/2012  . Hypertensive retinopathy of both eyes 05/18/2016  . Left nephrolithiasis 08/31/2015   With mild left hydronephrosis.  Scheduled to undergo shockwave lithotripsy.  . Obesity (BMI 30.0-34.9) 12/07/2013  . Osteoarthritis of both knees 01/26/2012  . Positive PPD 12/07/2013  . Pulmonary embolism ([place]) 09/30/2004   Diagnosed in April 2006 after returning from [place].  Likely provoked as it occurred after a 12 hour plane flight within 2 months of R MCA CVA with left hemiparesis. Patient has made an informed decision to remain on lifelong warfarin.   . Urge incontinence of urine 08/31/2015   Possibly  secondary to prior CVA.  Treated with Myrbetriq.   Social History   Social History  . Marital status: Divorced    Spouse name: N/A  . Number of children: N/A  . Years of education: N/A   Social History Main Topics  . Smoking status: Former Smoker    Quit date: 06/28/2007  . Smokeless tobacco: Never Used  . Alcohol use No     Comment: Remote past  . Drug use: No  . Sexual activity: Not on file   Other Topics Concern  . Not on file   Social History Narrative   Born in [place] but has been in the [place] for > 30  years.  Divorced, 1 daughter and 2 sons.  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   Family History  Problem Relation Age of Onset  . Stroke Maternal Grandmother   . Unexplained death Mother   . Depression Mother        After husband's death  . Unexplained death Father   . Osteoarthritis Father        Knees  . Early death Sister   . Seizures Sister   . Early death Brother 27       Unknown cause  . Healthy Daughter   . Healthy Son   . Stroke Maternal Aunt   . Healthy Sister   . Healthy Sister   . Unexplained death Brother   . Healthy Brother   . Healthy Son     ASSESSMENT Recent Results: The most recent result is correlated with 35 mg per week: Lab Results  Component Value Date   INR 2.30 03/20/2017   INR 2.20 01/30/2017   INR 2.60 12/26/2016    Anticoagulation Dosing: INR as of 03/20/2017 and Previous Warfarin Dosing Information    INR Dt INR Goal [employer] Sun Mon Tue Wed Thu Fri Sat   03/20/2017 2.30 2.0-3.0 35 mg 5 mg 5 mg 5 mg 5 mg 5 mg 5 mg 5 mg    Previous description   Take one (1) of your 5mg  peach-colored warfarin tablets by mouth daily at 6PM.    Anticoagulation Warfarin Dose Instructions as of 03/20/2017      Total Sun Mon Tue Wed Thu Fri Sat   New Dose 35 mg 5 mg 5 mg 5 mg 5 mg 5 mg 5 mg 5 mg     (5 mg x 1)  (5 mg x 1)  (5 mg x 1)  (5 mg x 1)  (5 mg x 1)  (5 mg x 1)  (5 mg x 1)                         Description   Take one (1) of your 5mg  peach-colored warfarin tablets by mouth daily at [facility].      INR today: Therapeutic  PLAN Weekly dose was unchanged .  Patient Instructions  Patient instructed to take medications as defined in the Anti-coagulation Track section of this encounter.  Patient instructed to take today's dose.  Patient instructed to take one (1) of your 5mg  peach-colored warfarin tablets by mouth, once-daily at [facility] each day.  Patient verbalized understanding of these instructions.      Patient advised to contact clinic or seek medical attention if signs/symptoms of bleeding or thromboembolism occur.  Patient verbalized understanding by repeating back information and was advised to contact me if further medication-related  questions arise. Patient was also provided an information handout.  Follow-up Return in 4 weeks (on 04/17/2017) for Follow up INR at 0845h.  [name], PharmD, CACP, CPP  15 minutes spent face-to-face with the patient during the encounter. 50% of time spent on education. 50% of time was spent on fingerstick point of care INR sample collection, processing, results determination and dose adjustment when necessary, documentation in EPIC/CHL and [email or web address]

## 2017-03-20 NOTE — Progress Notes (Signed)
INTERNAL MEDICINE TEACHING ATTENDING ADDENDUM - Aldine Contes M.D  Duration- indefnite, Indication- PE, INR- therapeutic. Agree with pharmacy recommendations as outlined in their note.

## 2017-03-20 NOTE — Patient Instructions (Signed)
Patient instructed to take medications as defined in the Anti-coagulation Track section of this encounter.  Patient instructed to take today's dose.  Patient instructed to take one (1) of your 5mg  peach-colored warfarin tablets by mouth, once-daily at [facility] each day.  Patient verbalized understanding of these instructions.

## 2017-03-31 ENCOUNTER — Ambulatory Visit (INDEPENDENT_AMBULATORY_CARE_PROVIDER_SITE_OTHER): Payer: Medicare HMO | Admitting: Internal Medicine

## 2017-03-31 ENCOUNTER — Encounter: Payer: Self-pay | Admitting: Internal Medicine

## 2017-03-31 VITALS — BP 120/80 | HR 83 | Temp 98.2°F | Wt 237.7 lb

## 2017-03-31 DIAGNOSIS — R040 Epistaxis: Secondary | ICD-10-CM

## 2017-03-31 DIAGNOSIS — Z86718 Personal history of other venous thrombosis and embolism: Secondary | ICD-10-CM

## 2017-03-31 DIAGNOSIS — K5909 Other constipation: Secondary | ICD-10-CM | POA: Diagnosis not present

## 2017-03-31 DIAGNOSIS — N5201 Erectile dysfunction due to arterial insufficiency: Secondary | ICD-10-CM

## 2017-03-31 DIAGNOSIS — E669 Obesity, unspecified: Secondary | ICD-10-CM | POA: Diagnosis not present

## 2017-03-31 DIAGNOSIS — N3941 Urge incontinence: Secondary | ICD-10-CM

## 2017-03-31 DIAGNOSIS — M172 Bilateral post-traumatic osteoarthritis of knee: Secondary | ICD-10-CM

## 2017-03-31 DIAGNOSIS — Z23 Encounter for immunization: Secondary | ICD-10-CM

## 2017-03-31 DIAGNOSIS — E7800 Pure hypercholesterolemia, unspecified: Secondary | ICD-10-CM | POA: Diagnosis not present

## 2017-03-31 DIAGNOSIS — I6930 Unspecified sequelae of cerebral infarction: Secondary | ICD-10-CM | POA: Diagnosis not present

## 2017-03-31 DIAGNOSIS — Z6832 Body mass index (BMI) 32.0-32.9, adult: Secondary | ICD-10-CM

## 2017-03-31 DIAGNOSIS — G8929 Other chronic pain: Secondary | ICD-10-CM | POA: Diagnosis not present

## 2017-03-31 DIAGNOSIS — M545 Low back pain: Secondary | ICD-10-CM | POA: Diagnosis not present

## 2017-03-31 MED ORDER — OXYCODONE-ACETAMINOPHEN 5-325 MG PO TABS: 1.0000 | ORAL_TABLET | Freq: Every day | ORAL | 0 refills | Status: DC | PRN

## 2017-03-31 MED ORDER — SORBITOL 70 % PO SOLN: 15.0000 mL | Freq: Every day | ORAL | 11 refills | Status: DC | PRN

## 2017-03-31 NOTE — Patient Instructions (Signed)
It was nice to see you today.  1) I started percocet 5-325 mg.  Take 1-2 tablets as needed for pain to see if this will help with the pain and allow you to exercise more.  I have given you #30 tablets a month.  I also gave you three written prescriptions which should get you through to January.  2) I started sorbitol 70% for constipation.  You can take 1-5 tablespoons per day adjusting to the desired number of bowel movements.  Start low so you do not get diarrhea.  3) We gave you the flu shot today.  4) Keep taking the other medications as you are.  I will see you back in 3 months, sooner if necessary.

## 2017-03-31 NOTE — Assessment & Plan Note (Signed)
Assessment  His constipation is reasonably well-controlled with an over-the-counter stool softener. With the addition of the as needed opiates I anticipate his chronic constipation will worsen once again.  Plan  He will continue with the over-the-counter stool softener. He is interested in trying sorbitol 70% 1-5 tablespoons by mouth in coffee, water, or juice each morning, titrated to desired bowel movements per day. We will reassess the efficacy of this therapy in the setting of the initiation of Percocet at the follow-up visit.

## 2017-03-31 NOTE — Assessment & Plan Note (Signed)
Assessment  He continues to experience left knee pain even when the brace is in place. He admits when he is not wearing the brace, he has extensive hyperextension of the knee on the left which further exacerbates the pain. The pain will radiate up the anterior aspect of his left lower extremity into his hip and left flank. Tylenol has been ineffective. In the past he was hesitant to start opiate medications. At this point, the pain has continued to worsen and it has impeded his ability and desire to remain active through walking. He therefore is now interested in considering opiate therapy.  Plan  We will start Percocet 5-325 mg 1-2 tablets every morning as needed for pain dispense #30 per month. He was given 3 one month prescriptions which should get him through December. We did not initiate a UDS or pain contract as we are assessing whether or not this therapy is effective in improving his quality of life and functionality. If, at follow-up, it has some positive effects in control of his pain and ability to ambulate we will initiate a formal pain contract and obtain a urine drug screen. Of course, we are avoiding NSAIDs given his Coumadin. He also did not respond in the past to Voltaren gel. We discussed the possibility of using Capsaicin cream and he wanted to keep that as a possible therapeutic intervention in the future. We will reassess the efficacy of the Percocet and controlling his pain and improving his functional status at the follow-up visit.

## 2017-03-31 NOTE — Assessment & Plan Note (Signed)
Assessment  He wishes to continue with the chronic anticoagulation understanding both the risks and the benefits of remaining on the warfarin. He is usually well within his INR target and has been following up consistently with Dr. Elie Confer.  Plan  We will continue with the warfarin with a goal INR of 2-3. We will reassess his interest in continuing anticoagulation in 1 year.

## 2017-03-31 NOTE — Progress Notes (Signed)
   Subjective:    Patient ID: Alexander English, male    DOB: 08-Sep-1955, 61 y.o.   MRN: [id number]  HPI  Alexander English is here for follow-up of left knee arthritis secondary to chronic hyperextension after CVA, chronic back pain, chronic constipation, history of stroke with residual defect, hyperlipidemia, erectile dysfunction, and recurrent epistaxis. Please see the A&P for the status of the pt's chronic medical problems.  Review of Systems  Constitutional: Positive for activity change. Negative for appetite change, fatigue, fever and unexpected weight change.       Unable to exercise because of left knee and side pain  Respiratory: Negative for chest tightness and shortness of breath.   Cardiovascular: Positive for leg swelling. Negative for chest pain and palpitations.       Chronic LLE swelling  Gastrointestinal: Positive for constipation. Negative for abdominal pain, diarrhea, nausea and vomiting.  Genitourinary: Positive for flank pain. Negative for hematuria.       Flank pain is musculoskeletal in nature and character  Musculoskeletal: Positive for arthralgias, back pain, gait problem, joint swelling and myalgias.  Skin: Negative for rash and wound.      Objective:   Physical Exam  Constitutional: He is oriented to person, place, and time. He appears well-developed and well-nourished. No distress.  HENT:  Head: Normocephalic and atraumatic.  Cardiovascular: Regular rhythm.   Abdominal: Soft. He exhibits no distension. There is no tenderness. There is no rebound and no guarding.  Musculoskeletal: He exhibits edema. He exhibits no tenderness or deformity.  Mild LLE edema, slight hyperextension of left knee with standing, even with brace in place  Neurological: He is alert and oriented to person, place, and time.  Left upper and lower extremity weakness (chronic)  Skin: Skin is warm and dry. No rash noted. He is not diaphoretic. No erythema.  Psychiatric: He has a normal  mood and affect. His behavior is normal. Judgment and thought content normal.  Nursing note and vitals reviewed.     Assessment & Plan:   Please see problem oriented charting.

## 2017-03-31 NOTE — Assessment & Plan Note (Signed)
Assessment  With the continued left knee pain and occasional antalgic gait without the brace his low back pain persists. At this point he localizes it more to the left flank then the low back posteriorly.  Plan  He is to continue wearing the brace in order to decrease the antalgic nature of his gait and its further exacerbation of his back pain. We are also starting Percocet for the knee pain, and this should also help with the back. We will reassess the efficacy of this therapy at the follow-up visit.

## 2017-03-31 NOTE — Assessment & Plan Note (Signed)
Assessment  He is last 4 pounds since last being seen in clinic.  Plan  He was praised for his weight loss and encouraged to become more active for further weight loss if we can improve his left knee pain. We will reassess his weight at the follow-up visit.

## 2017-03-31 NOTE — Assessment & Plan Note (Signed)
Assessment  He has had no further progression of his residual deficits in the left upper and lower extremities.  Plan  He continues the aspirin 81 mg by mouth daily. We also continue to address his hyperlipidemia. We will reassess his residual deficits at the follow-up visit.

## 2017-03-31 NOTE — Assessment & Plan Note (Signed)
He was given the flu vaccination today. He is otherwise up-to-date on his health care maintenance.

## 2017-03-31 NOTE — Assessment & Plan Note (Signed)
Assessment  He has not had any epistaxis for the last month despite continuing the warfarin and aspirin therapy.  Plan  We will continue the warfarin and aspirin therapy and reassess for recurrent epistaxis at the follow-up visit. He was encouraged to use his humidifier as the atmosphere becomes more dry moving into the fall and winter.

## 2017-03-31 NOTE — Assessment & Plan Note (Signed)
Assessment  He was unable to afford the Viagra and therefore we are unsure if it was efficacious in addressing his erectile dysfunction.  Plan  At this point, he is not interested in a another prescription for Viagra given the costs. We will continue to work on managing his hyperlipidemia as this is a modifiable risk factor. We will inquire whether or not he is interested in other alternatives for his erectile dysfunction at the follow-up visit.

## 2017-03-31 NOTE — Assessment & Plan Note (Signed)
Assessment  He denies any myalgias on the atorvastatin 10 mg by mouth daily.  Plan  We will continue with this moderate intensity statin and reassess for intolerances at the follow-up visit.

## 2017-03-31 NOTE — Assessment & Plan Note (Signed)
Assessment  His urge incontinence has been well controlled on the mirabegron 25 mg by mouth daily.  Plan  We will continue the mirabegron 25 mg by mouth daily and reassess the efficacy of this therapy at the follow-up visit.

## 2017-04-17 ENCOUNTER — Ambulatory Visit (INDEPENDENT_AMBULATORY_CARE_PROVIDER_SITE_OTHER): Payer: Medicare HMO | Admitting: Pharmacist

## 2017-04-17 DIAGNOSIS — Z86718 Personal history of other venous thrombosis and embolism: Secondary | ICD-10-CM | POA: Diagnosis not present

## 2017-04-17 DIAGNOSIS — Z7901 Long term (current) use of anticoagulants: Secondary | ICD-10-CM

## 2017-04-17 LAB — POCT INR: INR: 2.1

## 2017-04-17 NOTE — Patient Instructions (Signed)
Patient instructed to take medications as defined in the Anti-coagulation Track section of this encounter.  Patient instructed to take today's dose.  Patient instructed to take  one (1) of your 5mg  peach-colored warfarin tablets by mouth daily at [facility] on all days of the week--except on Mondays and Thursdays--take 1 & 1/2 tablets on Mondays and Thursdays. Patient verbalized understanding of these instructions.

## 2017-04-17 NOTE — Progress Notes (Signed)
Anticoagulation Management Alexander English is a 61 y.o. male who reports to the clinic for monitoring of warfarin treatment.    Indication: DVT , history of [Z86.718], long term (current) use of anticoagulants.  Duration: indefinite Supervising physician: [name]  Anticoagulation Clinic Visit History: Patient does not report signs/symptoms of bleeding or thromboembolism  Other recent changes: No diet, medications, lifestyle changes endorsed by the patient to me.  Anticoagulation Episode Summary    Current INR goal:   2.0-3.0  TTR:   84.2 % (5.1 y)  Next INR check:   05/15/2017  INR from last check:   2.10 (04/17/2017)  Weekly max warfarin dose:     Target end date:   Indefinite  INR check location:   [facility]  Preferred lab:     Send INR reminders to:      Indications   History of venous thromboembolism [Z86.718]       Comments:           No Known Allergies Prior to Admission medications   Medication Sig Start Date End Date Taking? Authorizing Provider  aspirin EC 81 MG tablet Take 81 mg by mouth daily.   Yes [provider]  atorvastatin (LIPITOR) 10 MG tablet Take 1 tablet (10 mg total) by mouth daily. 05/27/16  Yes [name], MD  benzoyl peroxide 5 % external liquid Apply topically 2 (two) times daily. 10/24/16  Yes [name], MD  mirabegron ER (MYRBETRIQ) 25 MG TB24 tablet Take 25 mg by mouth daily.   Yes [provider]  Multiple Vitamin (MULTIVITAMIN) tablet Take 1 tablet by mouth daily.   Yes [provider]  oxyCODONE-acetaminophen (PERCOCET/ROXICET) 5-325 MG tablet Take 1-2 tablets by mouth daily as needed for severe pain. 03/31/17  Yes [name], MD  sorbitol 70 % solution Take 15 mLs by mouth daily as needed. 03/31/17  Yes [name], MD  warfarin (COUMADIN) 5 MG tablet Take 1 tablet (5 mg total) by mouth daily at 6 PM. 02/21/17 05/22/17 Yes [name], DO   Past Medical History:  Diagnosis Date   . Bilateral cataracts 05/18/2016   Not visually significant  . Chronic constipation 06/12/2013  . Chronic low back pain 06/12/2013   L4-5 facet arthropathy   . Chronic pain of both shoulders 12/07/2013   A/C joint osteoarthritis   . Cluster headaches    Resolved in 2006  . Dry eye syndrome, bilateral 05/18/2016  . Embolic stroke involving right middle cerebral artery ([place]) 08/08/2004   Occured after traveling from [place] to [place] where he was hospitalized for 1 month with no further work-up or therapy.  Returned to [place] unable to walk and was admitted to [facility] immediately after landing. Presumed embolic per neuro but TEE, bubble study, and hypercoag W/U negative. On indefinite coumadin per patient's informed preference.  Residual effects : Left spastic hemiplegia. Tx with phenol tibi  . Hyperplastic colon polyp 07/27/2012   Excised endoscopically 07/27/2012  . Hypertensive retinopathy of both eyes 05/18/2016  . Left nephrolithiasis 08/31/2015   With mild left hydronephrosis.  Scheduled to undergo shockwave lithotripsy.  . Obesity (BMI 30.0-34.9) 12/07/2013  . Osteoarthritis of both knees 01/26/2012  . Positive PPD 12/07/2013  . Pulmonary embolism ([place]) 09/30/2004   Diagnosed in April 2006 after returning from [place].  Likely provoked as it occurred after a 12 hour plane flight within 2 months of R MCA CVA with left hemiparesis. Patient has made an informed decision to remain on lifelong  warfarin.   . Urge incontinence of urine 08/31/2015   Possibly secondary to prior CVA.  Treated with Myrbetriq.   Social History   Social History  . Marital status: Divorced    Spouse name: Alexander English  . Number of children: Alexander English  . Years of education: Alexander English   Social History Main Topics  . Smoking status: Former Smoker    Quit date: 06/28/2007  . Smokeless tobacco: Never Used  . Alcohol use No     Comment: Remote past  . Drug use: No  . Sexual activity: Not on file   Other Topics Concern  . Not on file   Social  History Narrative   Born in [place] but has been in the [place] for > 30 years.  Divorced, 1 daughter and 2 sons.  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   Family History  Problem Relation Age of Onset  . Stroke Maternal Grandmother   . Unexplained death Mother   . Depression Mother        After husband's death  . Unexplained death Father   . Osteoarthritis Father        Knees  . Early death Sister   . Seizures Sister   . Early death Brother 4       Unknown cause  . Healthy Daughter   . Healthy Son   . Stroke Maternal Aunt   . Healthy Sister   . Healthy Sister   . Unexplained death Brother   . Healthy Brother   . Healthy Son     ASSESSMENT Recent Results: The most recent result is correlated with 35 mg per week: Lab Results  Component Value Date   INR 2.10 04/17/2017   INR 2.30 03/20/2017   INR 2.20 01/30/2017    Anticoagulation Dosing: INR as of 04/17/2017 and Previous Warfarin Dosing Information    INR Dt INR Goal [employer] Sun Mon Tue Wed Thu Fri Sat   04/17/2017 2.10 2.0-3.0 35 mg 5 mg 5 mg 5 mg 5 mg 5 mg 5 mg 5 mg    Previous description   Take one (1) of your 5mg  peach-colored warfarin tablets by mouth daily at 6PM.    Anticoagulation Warfarin Dose Instructions as of 04/17/2017      Total Sun Mon Tue Wed Thu Fri Sat   New Dose 40 mg 5 mg 7.5 mg 5 mg 5 mg 7.5 mg 5 mg 5 mg     (5 mg x 1)  (5 mg x 1.5)  (5 mg x 1)  (5 mg x 1)  (5 mg x 1.5)  (5 mg x 1)  (5 mg x 1)                         Description   Take one (1) of your 5mg  peach-colored warfarin tablets by mouth daily at [facility] on all days of the week--except on Mondays and Thursdays--take 1 & 1/2 tablets on Mondays and Thursdays.      INR today: Therapeutic  PLAN Weekly dose was increased by 12% to 40 mg per week  Patient Instructions  Patient instructed to take medications as defined in the Anti-coagulation Track section of this encounter.  Patient  instructed to take today's dose.  Patient instructed to take  one (1) of your 5mg  peach-colored warfarin tablets by mouth daily at [facility] on all days of the week--except on Mondays and  Thursdays--take 1 & 1/2 tablets on Mondays and Thursdays. Patient verbalized understanding of these instructions.     Patient advised to contact clinic or seek medical attention if signs/symptoms of bleeding or thromboembolism occur.  Patient verbalized understanding by repeating back information and was advised to contact me if further medication-related questions arise. Patient was also provided an information handout.  Follow-up Return in about 4 weeks (around 05/15/2017) for Follow up INR at 0900h.  [name], PharmD, CACP, CPP  15 minutes spent face-to-face with the patient during the encounter. 50% of time spent on education. 50% of time was spent on fingerstick point of care INR sample collection, processing, results determination and dose adjustment; Documentation in EPIC/CHL and [email or web address]

## 2017-05-01 NOTE — Progress Notes (Signed)
Indication: presumed embolic CVA. Duration: Indefinite per patient preference. INR: At target. Agree with Dr. Gladstone Pih assessment and plan.

## 2017-05-12 ENCOUNTER — Other Ambulatory Visit: Payer: Self-pay | Admitting: Internal Medicine

## 2017-05-12 DIAGNOSIS — E785 Hyperlipidemia, unspecified: Secondary | ICD-10-CM

## 2017-05-12 NOTE — Telephone Encounter (Signed)
Next appt scheduled  07/07/17 with PCP.

## 2017-05-15 ENCOUNTER — Ambulatory Visit: Payer: Medicare HMO | Admitting: Pharmacist

## 2017-05-15 DIAGNOSIS — Z86718 Personal history of other venous thrombosis and embolism: Secondary | ICD-10-CM | POA: Diagnosis not present

## 2017-05-15 DIAGNOSIS — Z7901 Long term (current) use of anticoagulants: Secondary | ICD-10-CM

## 2017-05-15 LAB — POCT INR: INR: 2.8

## 2017-05-15 NOTE — Progress Notes (Signed)
Indication: Venous thromboembolism. Duration: Indefinite per patient preference. INR: At target. Agree with Dr. Romeo Apple assessment and plan.

## 2017-05-15 NOTE — Patient Instructions (Signed)
Patient instructed to take medications as defined in the Anti-coagulation Track section of this encounter.  Patient instructed to take today's dose.  Patient instructed to take one (1) of your 5mg  peach-colored warfarin tablets by mouth daily at [facility] on all days of the week--except on Mondays and Thursdays--take 1 & 1/2 tablets on Mondays and Thursdays.  Patient verbalized understanding of these instructions.

## 2017-05-15 NOTE — Progress Notes (Signed)
Alexander English is a 61 y.o. male who reports to the clinic for monitoring of warfarin treatment.    Indication: DVT  Duration: indefinite Supervising physician: [name]  Alexander Clinic Visit History: Patient does not report signs/symptoms of bleeding or thromboembolism  Other recent changes: No reported diet, medications, lifestyle changes Alexander Episode Summary    Current INR goal:   2.0-3.0  TTR:   84.4 % (5.2 y)  Next INR check:   06/12/2017  INR from last check:   2.8 (05/15/2017)  Weekly max warfarin dose:     Target end date:   Indefinite  INR check location[facility]   [facility]  Preferred lab:     Send INR reminders to:      Indications   History of venous thromboembolism [Z86.718]       Comments:           No Known Allergies Prior to Admission medications   Medication Sig Start Date End Date Taking? Authorizing Provider  aspirin EC 81 MG tablet Take 81 mg by mouth daily.   Yes [provider]  atorvastatin (LIPITOR) 10 MG tablet Take 1 tablet (10 mg total) daily by mouth. 05/12/17  Yes [name], MD  benzoyl peroxide 5 % external liquid Apply topically 2 (two) times daily. 10/24/16  Yes [name], MD  mirabegron ER (MYRBETRIQ) 25 MG TB24 tablet Take 25 mg by mouth daily.   Yes [provider]  Multiple Vitamin (MULTIVITAMIN) tablet Take 1 tablet by mouth daily.   Yes [provider]  oxyCODONE-acetaminophen (PERCOCET/ROXICET) 5-325 MG tablet Take 1-2 tablets by mouth daily as needed for severe pain. 03/31/17  Yes [name], MD  sorbitol 70 % solution Take 15 mLs by mouth daily as needed. 03/31/17  Yes [name], MD  warfarin (COUMADIN) 5 MG tablet Take 1 tablet (5 mg total) by mouth daily at 6 PM. 02/21/17 05/22/17 Yes [name], DO   Past Medical History:  Diagnosis Date  . Bilateral cataracts 05/18/2016   Not visually significant  . Chronic constipation  06/12/2013  . Chronic low back pain 06/12/2013   L4-5 facet arthropathy   . Chronic pain of both shoulders 12/07/2013   A/C joint osteoarthritis   . Cluster headaches    Resolved in 2006  . Dry eye syndrome, bilateral 05/18/2016  . Embolic stroke involving right middle cerebral artery ([place]) 08/08/2004   Occured after traveling from [place] to [place] where he was hospitalized for 1 month with no further work-up or therapy.  Returned to [place] unable to walk and was admitted to [facility] immediately after landing. Presumed embolic per neuro but TEE, bubble study, and hypercoag W/U negative. On indefinite coumadin per patient's informed preference.  Residual effects : Left spastic hemiplegia. Tx with phenol tibi  . Hyperplastic colon polyp 07/27/2012   Excised endoscopically 07/27/2012  . Hypertensive retinopathy of both eyes 05/18/2016  . Left nephrolithiasis 08/31/2015   With mild left hydronephrosis.  Scheduled to undergo shockwave lithotripsy.  . Obesity (BMI 30.0-34.9) 12/07/2013  . Osteoarthritis of both knees 01/26/2012  . Positive PPD 12/07/2013  . Pulmonary embolism ([place]) 09/30/2004   Diagnosed in April 2006 after returning from [place].  Likely provoked as it occurred after a 12 hour plane flight within 2 months of R MCA CVA with left hemiparesis. Patient has made an informed decision to remain on lifelong warfarin.   . Urge incontinence of urine 08/31/2015   Possibly secondary to prior CVA.  Treated with Myrbetriq.   Social History   Socioeconomic History  . Marital status: Divorced    Spouse name: Not on file  . Number of children: Not on file  . Years of education: Not on file  . Highest education level: Not on file  Social Needs  . Financial resource strain: Not on file  . Food insecurity - worry: Not on file  . Food insecurity - inability: Not on file  . Transportation needs - medical: Not on file  . Transportation needs - non-medical: Not on file  Occupational History  . Not on file  Tobacco  Use  . Smoking status: Former Smoker    Last attempt to quit: 06/28/2007    Years since quitting: 9.8  . Smokeless tobacco: Never Used  Substance and Sexual Activity  . Alcohol use: No    Alcohol/week: 0.0 oz    Comment: Remote past  . Drug use: No  . Sexual activity: Not on file  Other Topics Concern  . Not on file  Social History Narrative   Born in [place] but has been in the [place] for > 30 years.  Divorced, 1 daughter and 2 sons.  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   Family History  Problem Relation Age of Onset  . Stroke Maternal Grandmother   . Unexplained death Mother   . Depression Mother        After husband's death  . Unexplained death Father   . Osteoarthritis Father        Knees  . Early death Sister   . Seizures Sister   . Early death Brother 16       Unknown cause  . Healthy Daughter   . Healthy Son   . Stroke Maternal Aunt   . Healthy Sister   . Healthy Sister   . Unexplained death Brother   . Healthy Brother   . Healthy Son     ASSESSMENT Recent Results: The most recent result is correlated with 40 mg per week: Lab Results  Component Value Date   INR 2.8 05/15/2017   INR 2.10 04/17/2017   INR 2.30 03/20/2017    Alexander Dosing: Description   Take one (1) of your 5mg  peach-colored warfarin tablets by mouth daily at [facility] on all days of the week--except on Mondays and Thursdays--take 1 & 1/2 tablets on Mondays and Thursdays.      INR today: Therapeutic  PLAN Weekly dose was unchanged   Patient Instructions  Patient instructed to take medications as defined in the Anti-coagulation Track section of this encounter.  Patient instructed to take today's dose.  Patient instructed to take one (1) of your 5mg  peach-colored warfarin tablets by mouth daily at [facility] on all days of the week--except on Mondays and Thursdays--take 1 & 1/2 tablets on Mondays and Thursdays.  Patient verbalized understanding  of these instructions.   Patient advised to contact clinic or seek medical attention if signs/symptoms of bleeding or thromboembolism occur.  Patient verbalized understanding by repeating back information and was advised to contact me if further medication-related questions arise. Patient was also provided an information handout.  Follow-up Return in 4 weeks (on 06/12/2017), or Follow up INR at 0900.  Mila Merry Gerarda Fraction, PharmD PGY1 Pharmacy Resident Pager: [phone]  15 minutes spent face-to-face with the patient during the encounter. 50% of time spent on education. 50% of time was spent on point of care INR fingerstick test,  results review, interpretation, and documentation in CHL and [email or web address]

## 2017-05-30 DIAGNOSIS — H40023 Open angle with borderline findings, high risk, bilateral: Secondary | ICD-10-CM | POA: Diagnosis not present

## 2017-06-12 ENCOUNTER — Ambulatory Visit: Payer: Medicare HMO | Admitting: Pharmacist

## 2017-06-12 DIAGNOSIS — Z86711 Personal history of pulmonary embolism: Secondary | ICD-10-CM | POA: Diagnosis not present

## 2017-06-12 DIAGNOSIS — Z86718 Personal history of other venous thrombosis and embolism: Secondary | ICD-10-CM | POA: Diagnosis not present

## 2017-06-12 DIAGNOSIS — Z7901 Long term (current) use of anticoagulants: Secondary | ICD-10-CM | POA: Diagnosis not present

## 2017-06-12 LAB — POCT INR: INR: 2.4

## 2017-06-12 MED ORDER — WARFARIN SODIUM 5 MG PO TABS: ORAL_TABLET | ORAL | 0 refills | Status: DC

## 2017-06-12 NOTE — Progress Notes (Signed)
Anticoagulation Management Alexander English is a 61 y.o. male who reports to the clinic for monitoring of warfarin treatment.    Indication: DVT , history of; long term (current) use of anticoagulants.  Duration: indefinite Supervising physician: [name]  Anticoagulation Clinic Visit History: Patient does not report signs/symptoms of bleeding or thromboembolism  Other recent changes: No diet, medications, lifestyle changes endorsed by patient to me.  Anticoagulation Episode Summary    Current INR goal:   2.0-3.0  TTR:   84.6 % (5.2 y)  Next INR check:   07/10/2017  INR from last check:   2.40 (06/12/2017)  Weekly max warfarin dose:     Target end date:   Indefinite  INR check location:   [facility]  Preferred lab:     Send INR reminders to:      Indications   History of venous thromboembolism [Z86.718]       Comments:           No Known Allergies Prior to Admission medications   Medication Sig Start Date End Date Taking? Authorizing Provider  aspirin EC 81 MG tablet Take 81 mg by mouth daily.   Yes [provider]  atorvastatin (LIPITOR) 10 MG tablet Take 1 tablet (10 mg total) daily by mouth. 05/12/17  Yes [name], MD  benzoyl peroxide 5 % external liquid Apply topically 2 (two) times daily. 10/24/16  Yes [name], MD  mirabegron ER (MYRBETRIQ) 25 MG TB24 tablet Take 25 mg by mouth daily.   Yes [provider]  Multiple Vitamin (MULTIVITAMIN) tablet Take 1 tablet by mouth daily.   Yes [provider]  oxyCODONE-acetaminophen (PERCOCET/ROXICET) 5-325 MG tablet Take 1-2 tablets by mouth daily as needed for severe pain. 03/31/17  Yes [name], MD  sorbitol 70 % solution Take 15 mLs by mouth daily as needed. 03/31/17  Yes [name], MD  warfarin (COUMADIN) 5 MG tablet Take 1 & 1/2 tablets on Mondays and Thursdays; all other days, take one (1) tablet by mouth at 6PM. 06/12/17   [name], RPH-CPP   Past  Medical History:  Diagnosis Date  . Bilateral cataracts 05/18/2016   Not visually significant  . Chronic constipation 06/12/2013  . Chronic low back pain 06/12/2013   L4-5 facet arthropathy   . Chronic pain of both shoulders 12/07/2013   A/C joint osteoarthritis   . Cluster headaches    Resolved in 2006  . Dry eye syndrome, bilateral 05/18/2016  . Embolic stroke involving right middle cerebral artery ([place]) 08/08/2004   Occured after traveling from [place] to [place] where he was hospitalized for 1 month with no further work-up or therapy.  Returned to [place] unable to walk and was admitted to [facility] immediately after landing. Presumed embolic per neuro but TEE, bubble study, and hypercoag W/U negative. On indefinite coumadin per patient's informed preference.  Residual effects : Left spastic hemiplegia. Tx with phenol tibi  . Hyperplastic colon polyp 07/27/2012   Excised endoscopically 07/27/2012  . Hypertensive retinopathy of both eyes 05/18/2016  . Left nephrolithiasis 08/31/2015   With mild left hydronephrosis.  Scheduled to undergo shockwave lithotripsy.  . Obesity (BMI 30.0-34.9) 12/07/2013  . Osteoarthritis of both knees 01/26/2012  . Positive PPD 12/07/2013  . Pulmonary embolism ([name]) 09/30/2004   Diagnosed in April 2006 after returning from [place].  Likely provoked as it occurred after a 12 hour plane flight within 2 months of R MCA CVA with left hemiparesis. Patient has made an  informed decision to remain on lifelong warfarin.   . Urge incontinence of urine 08/31/2015   Possibly secondary to prior CVA.  Treated with Myrbetriq.   Social History   Socioeconomic History  . Marital status: Divorced    Spouse name: Not on file  . Number of children: Not on file  . Years of education: Not on file  . Highest education level: Not on file  Social Needs  . Financial resource strain: Not on file  . Food insecurity - worry: Not on file  . Food insecurity - inability: Not on file  . Transportation needs -  medical: Not on file  . Transportation needs - non-medical: Not on file  Occupational History  . Not on file  Tobacco Use  . Smoking status: Former Smoker    Last attempt to quit: 06/28/2007    Years since quitting: 9.9  . Smokeless tobacco: Never Used  Substance and Sexual Activity  . Alcohol use: No    Alcohol/week: 0.0 oz    Comment: Remote past  . Drug use: No  . Sexual activity: Not on file  Other Topics Concern  . Not on file  Social History Narrative   Born in [place] but has been in the [place] for > 30 years.  Divorced, 1 daughter and 2 sons.  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   Family History  Problem Relation Age of Onset  . Stroke Maternal Grandmother   . Unexplained death Mother   . Depression Mother        After husband's death  . Unexplained death Father   . Osteoarthritis Father        Knees  . Early death Sister   . Seizures Sister   . Early death Brother 53       Unknown cause  . Healthy Daughter   . Healthy Son   . Stroke Maternal Aunt   . Healthy Sister   . Healthy Sister   . Unexplained death Brother   . Healthy Brother   . Healthy Son     ASSESSMENT Recent Results: The most recent result is correlated with 35 mg per week: Lab Results  Component Value Date   INR 2.40 06/12/2017   INR 2.8 05/15/2017   INR 2.10 04/17/2017    Anticoagulation Dosing: Description   Take one (1) of your 5mg  peach-colored warfarin tablets by mouth daily at [facility] on all days of the week--except on Mondays and Thursdays--take 1 & 1/2 tablets on Mondays and Thursdays.      INR today: Therapeutic  PLAN Weekly dose was unchanged.  Patient Instructions  Patient instructed to take medications as defined in the Anti-coagulation Track section of this encounter.  Patient instructed to take today's dose.  Patient instructed to take  one (1) of your 5mg  peach-colored warfarin tablets by mouth daily at [facility] on all days of  the week--except on Mondays and Thursdays--take 1 & 1/2 tablets on Mondays and Thursdays.  Patient verbalized understanding of these instructions.     Patient advised to contact clinic or seek medical attention if signs/symptoms of bleeding or thromboembolism occur.  Patient verbalized understanding by repeating back information and was advised to contact me if further medication-related questions arise. Patient was also provided an information handout.  Follow-up Return in 4 weeks (on 07/10/2017) for Follow up INR at 0900h.  [name], PharmD, CACP, CPP  15 minutes spent face-to-face with the  patient during the encounter. 50% of time spent on education. 50% of time was spent on fingerstick point of care INR sample collection, processing, results determination and documentation in [email or web address].

## 2017-06-12 NOTE — Patient Instructions (Signed)
Patient instructed to take medications as defined in the Anti-coagulation Track section of this encounter.  Patient instructed to take today's dose.  Patient instructed to take  one (1) of your 5mg  peach-colored warfarin tablets by mouth daily at [facility] on all days of the week--except on Mondays and Thursdays--take 1 & 1/2 tablets on Mondays and Thursdays.  Patient verbalized understanding of these instructions.

## 2017-06-12 NOTE — Progress Notes (Signed)
Indication: History of venous thromboembolism. Duration: Indefinite per patient preference. INR: At target. Agree with Dr. Groce's assessment and plan. 

## 2017-07-07 ENCOUNTER — Ambulatory Visit: Payer: Medicare HMO

## 2017-07-07 ENCOUNTER — Ambulatory Visit: Payer: Medicare HMO | Admitting: Internal Medicine

## 2017-07-10 ENCOUNTER — Ambulatory Visit: Payer: Medicare HMO

## 2017-07-13 ENCOUNTER — Ambulatory Visit (INDEPENDENT_AMBULATORY_CARE_PROVIDER_SITE_OTHER): Payer: Medicare HMO | Admitting: Pharmacist

## 2017-07-13 ENCOUNTER — Encounter: Payer: Self-pay | Admitting: Internal Medicine

## 2017-07-13 ENCOUNTER — Ambulatory Visit (INDEPENDENT_AMBULATORY_CARE_PROVIDER_SITE_OTHER): Payer: Medicare HMO | Admitting: Internal Medicine

## 2017-07-13 VITALS — BP 127/84 | HR 74 | Temp 97.6°F | Ht 72.0 in | Wt 240.3 lb

## 2017-07-13 DIAGNOSIS — Z5181 Encounter for therapeutic drug level monitoring: Secondary | ICD-10-CM | POA: Diagnosis not present

## 2017-07-13 DIAGNOSIS — J019 Acute sinusitis, unspecified: Secondary | ICD-10-CM | POA: Diagnosis not present

## 2017-07-13 DIAGNOSIS — Z8673 Personal history of transient ischemic attack (TIA), and cerebral infarction without residual deficits: Secondary | ICD-10-CM

## 2017-07-13 DIAGNOSIS — Z86718 Personal history of other venous thrombosis and embolism: Secondary | ICD-10-CM

## 2017-07-13 DIAGNOSIS — Z7901 Long term (current) use of anticoagulants: Secondary | ICD-10-CM

## 2017-07-13 DIAGNOSIS — M170 Bilateral primary osteoarthritis of knee: Secondary | ICD-10-CM

## 2017-07-13 DIAGNOSIS — Z86711 Personal history of pulmonary embolism: Secondary | ICD-10-CM

## 2017-07-13 DIAGNOSIS — B9689 Other specified bacterial agents as the cause of diseases classified elsewhere: Secondary | ICD-10-CM | POA: Diagnosis not present

## 2017-07-13 DIAGNOSIS — Z87891 Personal history of nicotine dependence: Secondary | ICD-10-CM | POA: Diagnosis not present

## 2017-07-13 LAB — POCT INR: INR: 1.5

## 2017-07-13 MED ORDER — AMOXICILLIN-POT CLAVULANATE 875-125 MG PO TABS: 1.0000 | ORAL_TABLET | Freq: Two times a day (BID) | ORAL | 0 refills | Status: AC

## 2017-07-13 MED ORDER — PSEUDOEPHEDRINE HCL 30 MG PO TABS: 30.0000 mg | ORAL_TABLET | Freq: Three times a day (TID) | ORAL | 0 refills | Status: AC | PRN

## 2017-07-13 NOTE — Progress Notes (Signed)
Case discussed with Dr. Huang at the time of the visit.  We reviewed the resident's history and exam and pertinent patient test results.  I agree with the assessment, diagnosis and plan of care documented in the resident's note. 

## 2017-07-13 NOTE — Patient Instructions (Addendum)
FOLLOW-UP INSTRUCTIONS When: 1 month For: health maintenance with PCP What to bring: medications   Alexander English,  It was a pleasure to meet you today.  You most likely had a cold from a viral infection that is now a bacterial infection. I have sent two prescriptions to your pharmacy.  - Please take augmentin 875-125mg  twice a day for 5 days. - Please take sudafed 30mg  every 8 hours as needed for congestion for 5 days.  Your symptoms should improve with time. If they do not improve in the next 5-7 days, please return to clinic for further evaluation.  Please also schedule an appointment to be seen by your primary doctor in 1 month.

## 2017-07-13 NOTE — Patient Instructions (Signed)
Patient instructed to take medications as defined in the Anti-coagulation Track section of this encounter.  Patient instructed to take today's dose.  Patient instructed to take one (1) of your 5mg  peach-colored warfarin tablets by mouth daily at [facility] on all days of the week--except on Tuesdays, Thursdays and Saturdays--take 1 & 1/2 tablets of your 5mg  peach-colored warfarin tablets on these days.  Patient verbalized understanding of these instructions.

## 2017-07-13 NOTE — Progress Notes (Signed)
Anticoagulation Management Alexander English is a 63 y.o. male who reports to the clinic for monitoring of warfarin treatment.    Indication: History of venous thromboembolism [Z86.718]; long term current use of anticoagulant.  Duration: indefinite Supervising physician: [name]  Anticoagulation Clinic Visit History: Patient does report signs/symptoms of bleeding or thromboembolism. See patient findings section.  Other recent changes: YES....diet, medications, lifestyle as documented in patient findings section.  Anticoagulation Episode Summary    Current INR goal:   2.0-3.0  TTR:   84.0 % (5.3 y)  Next INR check:   07/24/2017  INR from last check:   1.50! (07/13/2017)  Weekly max warfarin dose:     Target end date:   Indefinite  INR check location:   [facility]  Preferred lab:     Send INR reminders to:      Indications   History of venous thromboembolism [Z86.718]       Comments:           No Known Allergies Prior to Admission medications   Medication Sig Start Date End Date Taking? Authorizing Provider  amoxicillin-clavulanate (AUGMENTIN) 875-125 MG tablet Take 1 tablet by mouth 2 (two) times daily for 5 days. 07/13/17 07/18/17 Yes Colbert Ewing, MD  aspirin EC 81 MG tablet Take 81 mg by mouth daily.   Yes [provider]  atorvastatin (LIPITOR) 10 MG tablet Take 1 tablet (10 mg total) daily by mouth. 05/12/17  Yes [name], MD  benzoyl peroxide 5 % external liquid Apply topically 2 (two) times daily. 10/24/16  Yes [name], MD  mirabegron ER (MYRBETRIQ) 25 MG TB24 tablet Take 25 mg by mouth daily.   Yes [provider]  Multiple Vitamin (MULTIVITAMIN) tablet Take 1 tablet by mouth daily.   Yes [provider]  oxyCODONE-acetaminophen (PERCOCET/ROXICET) 5-325 MG tablet Take 1-2 tablets by mouth daily as needed for severe pain. 03/31/17  Yes [name], MD  pseudoephedrine (SUDAFED) 30 MG tablet Take 1 tablet (30 mg  total) by mouth every 8 (eight) hours as needed for up to 5 days for congestion. 07/13/17 07/18/17 Yes Colbert Ewing, MD  sorbitol 70 % solution Take 15 mLs by mouth daily as needed. 03/31/17  Yes [name], MD  warfarin (COUMADIN) 5 MG tablet Take 1 & 1/2 tablets on Mondays and Thursdays; all other days, take one (1) tablet by mouth at 6PM. 06/12/17  Yes [name], RPH-CPP   Past Medical History:  Diagnosis Date  . Bilateral cataracts 05/18/2016   Not visually significant  . Chronic constipation 06/12/2013  . Chronic low back pain 06/12/2013   L4-5 facet arthropathy   . Chronic pain of both shoulders 12/07/2013   A/C joint osteoarthritis   . Cluster headaches    Resolved in 2006  . Dry eye syndrome, bilateral 05/18/2016  . Embolic stroke involving right middle cerebral artery ([place]) 08/08/2004   Occured after traveling from [place] to [place] where he was hospitalized for 1 month with no further work-up or therapy.  Returned to [place] unable to walk and was admitted to [facility] immediately after landing. Presumed embolic per neuro but TEE, bubble study, and hypercoag W/U negative. On indefinite coumadin per patient's informed preference.  Residual effects : Left spastic hemiplegia. Tx with phenol tibi  . Hyperplastic colon polyp 07/27/2012   Excised endoscopically 07/27/2012  . Hypertensive retinopathy of both eyes 05/18/2016  . Left nephrolithiasis 08/31/2015   With mild left hydronephrosis.  Scheduled to undergo shockwave lithotripsy.  Marland Kitchen  Obesity (BMI 30.0-34.9) 12/07/2013  . Osteoarthritis of both knees 01/26/2012  . Positive PPD 12/07/2013  . Pulmonary embolism ([place]) 09/30/2004   Diagnosed in April 2006 after returning from [place].  Likely provoked as it occurred after a 12 hour plane flight within 2 months of R MCA CVA with left hemiparesis. Patient has made an informed decision to remain on lifelong warfarin.   . Urge incontinence of urine 08/31/2015   Possibly secondary to prior CVA.  Treated with  Myrbetriq.   Social History   Socioeconomic History  . Marital status: Divorced    Spouse name: Not on file  . Number of children: Not on file  . Years of education: Not on file  . Highest education level: Not on file  Social Needs  . Financial resource strain: Not on file  . Food insecurity - worry: Not on file  . Food insecurity - inability: Not on file  . Transportation needs - medical: Not on file  . Transportation needs - non-medical: Not on file  Occupational History  . Not on file  Tobacco Use  . Smoking status: Former Smoker    Last attempt to quit: 06/28/2007    Years since quitting: 10.0  . Smokeless tobacco: Never Used  Substance and Sexual Activity  . Alcohol use: No    Alcohol/week: 0.0 oz    Comment: Remote past  . Drug use: No  . Sexual activity: Not on file  Other Topics Concern  . Not on file  Social History Narrative   Born in [place] but has been in the [place] for > 30 years.  Divorced, 1 daughter and 2 sons.  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   Family History  Problem Relation Age of Onset  . Stroke Maternal Grandmother   . Unexplained death Mother   . Depression Mother        After husband's death  . Unexplained death Father   . Osteoarthritis Father        Knees  . Early death Sister   . Seizures Sister   . Early death Brother 18       Unknown cause  . Healthy Daughter   . Healthy Son   . Stroke Maternal Aunt   . Healthy Sister   . Healthy Sister   . Unexplained death Brother   . Healthy Brother   . Healthy Son     ASSESSMENT Recent Results: The most recent result is correlated with 40 mg per week: Lab Results  Component Value Date   INR 1.50 07/13/2017   INR 2.40 06/12/2017   INR 2.8 05/15/2017    Anticoagulation Dosing: Description   Take one (1) of your 5mg  peach-colored warfarin tablets by mouth daily at [facility] on all days of the week--except on Tuesdays, Thursdays and  Saturdays--take 1 & 1/2 tablets of your 5mg  peach-colored warfarin tablets on these days.      INR today: Subtherapeutic  PLAN Weekly dose was increased by 6% to 42.5 mg per week--because of potential for hypoprothrombinemic response due to DDI with his newly prescribed Augmentin that he will take for 5 days.   Patient Instructions  Patient instructed to take medications as defined in the Anti-coagulation Track section of this encounter.  Patient instructed to take today's dose.  Patient instructed to take one (1) of your 5mg  peach-colored warfarin tablets by mouth daily at [facility] on all days of the week--except on Tuesdays,  Thursdays and Saturdays--take 1 & 1/2 tablets of your 5mg  peach-colored warfarin tablets on these days.  Patient verbalized understanding of these instructions.     Patient advised to contact clinic or seek medical attention if signs/symptoms of bleeding or thromboembolism occur.  Patient verbalized understanding by repeating back information and was advised to contact me if further medication-related questions arise. Patient was also provided an information handout.  Follow-up Return in 11 days (on 07/24/2017) for Follow up INR at 0915h.  [name] Alexander English  15 minutes spent face-to-face with the patient during the encounter. 50% of time spent on education. 50% of time was spent on fingerstick point of care INR sample collection, processing, results determination, dose adjustment and documentation in [email or web address]. Patient was consented to participate as a Human Subject in a [employer] titled:  Preferences for after-visit summaries:  An analysis of how patients prefer to receive their after-visit summary from the anticoagulation management clinic at [facility]. IRB# [id number].

## 2017-07-13 NOTE — Assessment & Plan Note (Addendum)
Assessment Reports nasal congestion and facial pressure for the last 2 weeks. Initially associated with sore throat that has since resolved. Symptoms are worse at night and are affecting his ability to sleep. Physical exam without sinus pain or tenderness lungs are clear. He is afebrile today with stable vital signs.  No improvement with saline spray, Afrin, or Allegra-D.  His symptoms are most consistent with an acute bacterial sinusitis superimposed on viral URI, given persistent nasal drainage and congestion as well as duration of symptoms with little improvement on supportive therapy. Will prescribe antibiotics as well as a decongestant to help with symptoms. Patient is agreeable to plan.  Plan - Augmentin 875-125 mg twice daily 5 days - Sudafed 30 mg daily when necessary 5 days - Advised patient to return to clinic in 5-7 days if no improvement in symptoms - Advised patient to return to see PCP in the next month for health maintenance

## 2017-07-13 NOTE — Progress Notes (Signed)
Indication: History of venous thromboembolism. Duration: Indefinite per patient preference. INR: Below target. Agree with Dr. Gladstone Pih assessment and plan.

## 2017-07-13 NOTE — Progress Notes (Signed)
   CC: Congestion  HPI:  Alexander English is a 62 y.o. male with PMH significant for CVA, provoked PE (on lifelong warfarin), and osteoarthritis who presents with congestion for the last 2 weeks.  He states for the last 2 weeks, he has not felt well. He endorses a sore throat at the onset that has now improved, as well as persistent congestion, facial pressure, and intermittent chills. He states the congestion is worse at night. He has tried nasal spray, Afrin, and Allegra D with minimal relief in his symptoms. He feels that his symptoms are getting worse over the last 2 weeks rather than better. He denies cough, shortness breath, chest pain, ear pain, headaches, abdominal pain, nausea, or dysuria. He denies sick contacts, recent travel, or new rashes. He is a former smoker.  Past Medical History:  Diagnosis Date  . Bilateral cataracts 05/18/2016   Not visually significant  . Chronic constipation 06/12/2013  . Chronic low back pain 06/12/2013   L4-5 facet arthropathy   . Chronic pain of both shoulders 12/07/2013   A/C joint osteoarthritis   . Cluster headaches    Resolved in 2006  . Dry eye syndrome, bilateral 05/18/2016  . Embolic stroke involving right middle cerebral artery ([place]) 08/08/2004   Occured after traveling from [place] to [place] where he was hospitalized for 1 month with no further work-up or therapy.  Returned to [place] unable to walk and was admitted to [facility] immediately after landing. Presumed embolic per neuro but TEE, bubble study, and hypercoag W/U negative. On indefinite coumadin per patient's informed preference.  Residual effects : Left spastic hemiplegia. Tx with phenol tibi  . Hyperplastic colon polyp 07/27/2012   Excised endoscopically 07/27/2012  . Hypertensive retinopathy of both eyes 05/18/2016  . Left nephrolithiasis 08/31/2015   With mild left hydronephrosis.  Scheduled to undergo shockwave lithotripsy.  . Obesity (BMI 30.0-34.9) 12/07/2013  . Osteoarthritis of both  knees 01/26/2012  . Positive PPD 12/07/2013  . Pulmonary embolism ([place]) 09/30/2004   Diagnosed in April 2006 after returning from [place].  Likely provoked as it occurred after a 12 hour plane flight within 2 months of R MCA CVA with left hemiparesis. Patient has made an informed decision to remain on lifelong warfarin.   . Urge incontinence of urine 08/31/2015   Possibly secondary to prior CVA.  Treated with Myrbetriq.   Review of Systems:   GEN: Positive for chills. HEENT: Positive for congestion and dry throat. Negative for sore throat, ear pain, HA. CV: Negative for chest pain PULM: Negative for cough or shortness of breath ABD: Negative for abdominal pain or nausea GU: Negative for dysuria MSK: Positive for swelling in L leg (chronic).  Physical Exam:  Vitals:   07/13/17 0919  BP: 127/84  Pulse: 74  Temp: 97.6 F (36.4 C)  TempSrc: Oral  SpO2: 100%  Weight: 240 lb 4.8 oz (109 kg)  Height: 6' (1.829 m)   GEN: Sitting in chair comfortably in no acute distress. Sounds congested. HEENT: PERRL. No erythema or exudates in posterior pharynx. No sinus pain or tenderness. NECK: No cervical LAD CV: NR & RR, no m/r/g PULM: CTAB, no wheezes, rhonchi, or rales SKIN: Capillary refill < 2 sec. MSK: No LE edema. Brace on L leg  Assessment & Plan:   See Encounters Tab for problem based charting.  Patient discussed with Dr. London Pepper, MD Internal Medicine, PGY-1

## 2017-07-24 ENCOUNTER — Encounter: Payer: Self-pay | Admitting: Internal Medicine

## 2017-07-24 ENCOUNTER — Ambulatory Visit (INDEPENDENT_AMBULATORY_CARE_PROVIDER_SITE_OTHER): Payer: Medicare HMO | Admitting: Internal Medicine

## 2017-07-24 ENCOUNTER — Other Ambulatory Visit: Payer: Self-pay

## 2017-07-24 ENCOUNTER — Ambulatory Visit (INDEPENDENT_AMBULATORY_CARE_PROVIDER_SITE_OTHER): Payer: Medicare HMO | Admitting: Pharmacy Technician

## 2017-07-24 VITALS — BP 123/79 | HR 79 | Temp 98.4°F | Ht 72.0 in | Wt 239.3 lb

## 2017-07-24 DIAGNOSIS — Z86718 Personal history of other venous thrombosis and embolism: Secondary | ICD-10-CM

## 2017-07-24 DIAGNOSIS — Z5181 Encounter for therapeutic drug level monitoring: Secondary | ICD-10-CM

## 2017-07-24 DIAGNOSIS — D6832 Hemorrhagic disorder due to extrinsic circulating anticoagulants: Secondary | ICD-10-CM

## 2017-07-24 DIAGNOSIS — B9689 Other specified bacterial agents as the cause of diseases classified elsewhere: Secondary | ICD-10-CM

## 2017-07-24 DIAGNOSIS — Z87891 Personal history of nicotine dependence: Secondary | ICD-10-CM

## 2017-07-24 DIAGNOSIS — R040 Epistaxis: Secondary | ICD-10-CM | POA: Diagnosis not present

## 2017-07-24 DIAGNOSIS — Z7901 Long term (current) use of anticoagulants: Secondary | ICD-10-CM

## 2017-07-24 DIAGNOSIS — J019 Acute sinusitis, unspecified: Secondary | ICD-10-CM

## 2017-07-24 DIAGNOSIS — T45515S Adverse effect of anticoagulants, sequela: Secondary | ICD-10-CM | POA: Diagnosis not present

## 2017-07-24 LAB — POCT INR: INR: 2.4

## 2017-07-24 MED ORDER — LEVOFLOXACIN 750 MG PO TABS: 750.0000 mg | ORAL_TABLET | Freq: Every day | ORAL | 0 refills | Status: AC

## 2017-07-24 NOTE — Progress Notes (Signed)
Internal Medicine Clinic Attending  Case discussed with Dr. Harden at the time of the visit.  We reviewed the resident's history and exam and pertinent patient test results.  I agree with the assessment, diagnosis, and plan of care documented in the resident's note.  

## 2017-07-24 NOTE — Assessment & Plan Note (Addendum)
He was recently seen on 1/17 with nasal congestion and facial pressure persistent for 2 weeks after initial period of sore throat, felt to be an acute bacterial sinusitis following viral URI.  He was treated with a 5-day course of Augmentin without significant improvement in his symptoms, also not improving with sudafed and other OTC measures. He may have failed initial anti-biotic course and require repeat treatment with an alternative agent such as a fluoroquinolone. At this point, he does not have other sx that would raise concern for a more complicated sinusitis that would require imaging or ENT referral but these can be considered if he continues to fail to improve. Can also try more aggressive symptomatic relief with nasal irrigation, will provide instructions. Abx choice will alter warfarin dosing, pharmacy notified and will instruct pt appropriately.   --Levagquin 750 mg x 7 days --Nasal irrigation

## 2017-07-24 NOTE — Patient Instructions (Addendum)
Patient instructed to take medications as defined in the Anti-coagulation Track section of this encounter.  Patient instructed to OMIT TODAY's and TOMORROW's doses of warfarin (for total of 2 days omitted doses because of a new antibiotic being started after the patient saw [place] in our clinic. Patient was still in the clinic and his instruction were updated to reflect:  OMIT 2 days/doses of warfarin; then, resume by taking  take 1 tablet of his 5mg  peach-colored warfarin, alternating with 1/2 tablet (2.5mg ) every-other-day and to return to the clinic in 1 week. Patient verbalized understanding of these instructions.

## 2017-07-24 NOTE — Progress Notes (Signed)
Anticoagulation Management Alexander English is a 62 y.o. male who reports to the clinic for monitoring of warfarin treatment.    Indication: History of VTE  Duration: indefinite Supervising physician: Joni Reining  Anticoagulation Clinic Visit History: Patient does report signs/symptoms of bleeding or thromboembolism, with a nosebleed that resolved in five minutes or less.  Pt states this is a common occurrence for him in the winter. Other recent changes: No diet, medications, lifestyle changes, however pt states having sinus congestion over the past two weeks. Anticoagulation Episode Summary    Current INR goal:   2.0-3.0  TTR:   83.8 % (5.4 y)  Next INR check:   08/14/2017  INR from last check:   2.4 (07/24/2017)  Weekly max warfarin dose:     Target end date:   Indefinite  INR check location:   [facility]  Preferred lab:     Send INR reminders to:      Indications   History of venous thromboembolism [Z86.718]       Comments:           No Known Allergies Prior to Admission medications   Medication Sig Start Date End Date Taking? Authorizing Provider  aspirin EC 81 MG tablet Take 81 mg by mouth daily.   Yes [provider]  atorvastatin (LIPITOR) 10 MG tablet Take 1 tablet (10 mg total) daily by mouth. 05/12/17  Yes [name], MD  benzoyl peroxide 5 % external liquid Apply topically 2 (two) times daily. 10/24/16  Yes [name], MD  mirabegron ER (MYRBETRIQ) 25 MG TB24 tablet Take 25 mg by mouth daily.   Yes [provider]  Multiple Vitamin (MULTIVITAMIN) tablet Take 1 tablet by mouth daily.   Yes [provider]  oxyCODONE-acetaminophen (PERCOCET/ROXICET) 5-325 MG tablet Take 1-2 tablets by mouth daily as needed for severe pain. 03/31/17  Yes [name], MD  sorbitol 70 % solution Take 15 mLs by mouth daily as needed. 03/31/17  Yes [name], MD  warfarin (COUMADIN) 5 MG tablet Take 1 & 1/2 tablets on Mondays and  Thursdays; all other days, take one (1) tablet by mouth at 6PM. 06/12/17  Yes [name], RPH-CPP   Past Medical History:  Diagnosis Date  . Bilateral cataracts 05/18/2016   Not visually significant  . Chronic constipation 06/12/2013  . Chronic low back pain 06/12/2013   L4-5 facet arthropathy   . Chronic pain of both shoulders 12/07/2013   A/C joint osteoarthritis   . Cluster headaches    Resolved in 2006  . Dry eye syndrome, bilateral 05/18/2016  . Embolic stroke involving right middle cerebral artery ([place]) 08/08/2004   Occured after traveling from [place] to [place] where he was hospitalized for 1 month with no further work-up or therapy.  Returned to [place] unable to walk and was admitted to [facility] immediately after landing. Presumed embolic per neuro but TEE, bubble study, and hypercoag W/U negative. On indefinite coumadin per patient's informed preference.  Residual effects : Left spastic hemiplegia. Tx with phenol tibi  . Hyperplastic colon polyp 07/27/2012   Excised endoscopically 07/27/2012  . Hypertensive retinopathy of both eyes 05/18/2016  . Left nephrolithiasis 08/31/2015   With mild left hydronephrosis.  Scheduled to undergo shockwave lithotripsy.  . Obesity (BMI 30.0-34.9) 12/07/2013  . Osteoarthritis of both knees 01/26/2012  . Positive PPD 12/07/2013  . Pulmonary embolism ([place]) 09/30/2004   Diagnosed in April 2006 after returning from [place].  Likely provoked as it occurred after  a 12 hour plane flight within 2 months of R MCA CVA with left hemiparesis. Patient has made an informed decision to remain on lifelong warfarin.   . Urge incontinence of urine 08/31/2015   Possibly secondary to prior CVA.  Treated with Myrbetriq.   Social History   Socioeconomic History  . Marital status: Divorced    Spouse name: Not on file  . Number of children: Not on file  . Years of education: Not on file  . Highest education level: Not on file  Social Needs  . Financial resource strain: Not on file   . Food insecurity - worry: Not on file  . Food insecurity - inability: Not on file  . Transportation needs - medical: Not on file  . Transportation needs - non-medical: Not on file  Occupational History  . Not on file  Tobacco Use  . Smoking status: Former Smoker    Last attempt to quit: 06/28/2007    Years since quitting: 10.0  . Smokeless tobacco: Never Used  Substance and Sexual Activity  . Alcohol use: No    Alcohol/week: 0.0 oz    Comment: Remote past  . Drug use: No  . Sexual activity: Not on file  Other Topics Concern  . Not on file  Social History Narrative   Born in [place] but has been in the [place] for > 30 years.  Divorced, 1 daughter and 2 sons.  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   Family History  Problem Relation Age of Onset  . Stroke Maternal Grandmother   . Unexplained death Mother   . Depression Mother        After husband's death  . Unexplained death Father   . Osteoarthritis Father        Knees  . Early death Sister   . Seizures Sister   . Early death Brother 97       Unknown cause  . Healthy Daughter   . Healthy Son   . Stroke Maternal Aunt   . Healthy Sister   . Healthy Sister   . Unexplained death Brother   . Healthy Brother   . Healthy Son     ASSESSMENT Recent Results: The most recent result is correlated with 40 mg per week:  Pt was instructed on 07/13/17 to increase his dose to 42.5mg  per week, however did not make those changes and continued at 40mg  per week. Lab Results  Component Value Date   INR 2.4 07/24/2017   INR 1.50 07/13/2017   INR 2.40 06/12/2017    Anticoagulation Dosing: Description   Take one (1) of your 5mg  peach-colored warfarin tablets by mouth daily at [facility] on all days of the week--except on Tuesdays, and Thursdays--take 1 & 1/2 tablets of your 5mg  peach-colored warfarin tablets on these days.      INR today: Therapeutic  PLAN Weekly dose was unchanged as the  patient did not increase his dose on 1/17 as instructed, and so the weekly dose was changed in the system only from 42.5mg  to 40mg  weekly.    Patient Instructions  Patient instructed to take medications as defined in the Anti-coagulation Track section of this encounter.  Patient instructed to take today's dose.  Patient instructed to take one (1) of your 5mg  peach-colored warfarin tablets by mouth daily at [facility] on all days of the week--except on Tuesdays, and Thursdays--take 1 & 1/2 tablets of your 5mg  peach-colored warfarin tablets  on these days. Patient verbalized understanding of these instructions.     Patient advised to contact clinic or seek medical attention if signs/symptoms of bleeding or thromboembolism occur.  Patient verbalized understanding by repeating back information and was advised to contact me if further medication-related questions arise. Patient was also provided an information handout.  Follow-up Return in about 3 weeks (around 08/14/2017) for INR follow up at 1000.  Bertis Ruddy, PharmD Pharmacy Resident Pager #: [phone] 07/24/2017 10:16 AM  15 minutes spent face-to-face with the patient during the encounter. 50% of time spent on education. 50% of time was spent on INR point of care testing, analysis and documentation in EMR.

## 2017-07-24 NOTE — Patient Instructions (Addendum)
Nice to meet you Alexander English.   It looks like the infection in your sinus is still going on and the antibiotic did not adequately treat this. To help clear the congestion and infection, you can try a Netti-pot or cleaning out your sinuses. There are instructions on how to perform this below, or you can buy a pack at the pharmacy over the counter. We will also try a new antibiotic that will hopefully help get rid of this infection.  It is called levofloxacin and you will take this for 7 days.  If your sx get worse, please don't hesitate to call the clinic to make another appointment to check in.  You will need to stop by with Dr. Elie Confer and have close follow-up for your warfarin as this medication can affect the dosing.  BUFFERED ISOTONIC SALINE NASAL IRRIGATION  The Benefits:  1. When you irrigate, the isotonic saline (salt water) acts as a solvent and washes the mucus crusts and other debris from your nose.  2. This decongests and improves the airflow into your nose. The sinus passages begin to open.  3. Studies have also shown that a salt water and an alkaline (baking soda) irrigation solution improves nasal membrane cell function (mucociliary flow of mucus debris).  The Recipe:  1. Choose a 1-quart glass jar that is thoroughly cleansed.  2. Fill with sterile or distilled water, or you can boil water from the tap.  3. Add 1 to 2 heaping teaspoons of "pickling/canning/sea" salt (NOT table salt as it contains a large number of additives). This salt is available at the grocery store in the food canning section.  4. Add 1 teaspoon of Arm [employer] Hammer Baking Soda (pure bicarbonate).  5. Mix ingredients together and store at room temperature. Discard after one week. If you find this solution too strong, you may decrease the amount of salt added to 1 to 1  teaspoons. With children it is often best to start with a milder solution and advance slowly. Irrigate with 240 ml (8 oz) twice daily.  The  Instructions:  You should plan to irrigate your nose with buffered isotonic saline 2 times per day. Many people prefer to warm the solution slightly in the microwave - but be sure that the solution is NOT HOT. Stand over the sink (some do this in the shower) and squirt the solution into each side of your nose, keeping your mouth open. This allows you to spit the saltwater out of your mouth. It will not harm you if you swallow a little.  If you have been told to use a nasal steroid such as Flonase, Nasonex, or Nasacort, you should always use isortonic saline solution first, then use your nasal steroid product. The nasal steroid is much more effective when sprayed onto clean nasal membranes and the steroid medicine will reach deeper into the nose.  Most people experience a little burning sensation the first few times they use a isotonic saline solution, but this usually goes away within a few days.

## 2017-07-24 NOTE — Progress Notes (Signed)
   CC: Congestion follow up   HPI:  AlexanderAlexander English is a 62 y.o. male with a past medical history as described below who presents to the clinic for follow-up of sinusitis.  He was seen on 1/17 with complaints of nasal congestion facial pressure of 2 weeks duration.  He was prescribed a 5-day course of Augmentin and decongestant for symptom control.  Since that time, he reports persistent sx of nasal congestion with thick purulent mucus, worse on the left side and worse with laying down.  He states the Augmentin seemed to improve generalized body aches but has had no significant change with his congestion.  He notes occasional left-sided facial soreness or feeling like a headache is going to start, but the symptoms have been mild.  He denies fever, chills.  He also notes an episode of epistaxis, which is a chronic issue with his warfarin therapy.  Past Medical History:  Diagnosis Date  . Bilateral cataracts 05/18/2016   Not visually significant  . Chronic constipation 06/12/2013  . Chronic low back pain 06/12/2013   L4-5 facet arthropathy   . Chronic pain of both shoulders 12/07/2013   A/C joint osteoarthritis   . Cluster headaches    Resolved in 2006  . Dry eye syndrome, bilateral 05/18/2016  . Embolic stroke involving right middle cerebral artery ([place]) 08/08/2004   Occured after traveling from [place] to [place] where he was hospitalized for 1 month with no further work-up or therapy.  Returned to [place] unable to walk and was admitted to [facility] immediately after landing. Presumed embolic per neuro but TEE, bubble study, and hypercoag W/U negative. On indefinite coumadin per patient's informed preference.  Residual effects : Left spastic hemiplegia. Tx with phenol tibi  . Hyperplastic colon polyp 07/27/2012   Excised endoscopically 07/27/2012  . Hypertensive retinopathy of both eyes 05/18/2016  . Left nephrolithiasis 08/31/2015   With mild left hydronephrosis.  Scheduled to undergo shockwave  lithotripsy.  . Obesity (BMI 30.0-34.9) 12/07/2013  . Osteoarthritis of both knees 01/26/2012  . Positive PPD 12/07/2013  . Pulmonary embolism ([place]) 09/30/2004   Diagnosed in April 2006 after returning from [place].  Likely provoked as it occurred after a 12 hour plane flight within 2 months of R MCA CVA with left hemiparesis. Patient has made an informed decision to remain on lifelong warfarin.   . Urge incontinence of urine 08/31/2015   Possibly secondary to prior CVA.  Treated with Myrbetriq.   Review of Systems:  Review of Systems  Constitutional: Negative for chills and fever.  HENT: Positive for congestion and nosebleeds. Negative for sinus pain.      Physical Exam:  Vitals:   07/24/17 0959  BP: 123/79  Pulse: 79  Temp: 98.4 F (36.9 C)  TempSrc: Oral  SpO2: 98%  Weight: 239 lb 4.8 oz (108.5 kg)  Height: 6' (1.829 m)   General: Sitting in chair comfortably, no acute distress HEENT: Moist mucous membranes, no pharyngeal exudate, dried blood of epistaxis in the right nare, no tenderness to palpation of frontal or maxillary sinuses, no cervical lymphadenopathy CV: Regular rate and rhythm Resp: Clear breath sounds bilaterally, normal work of breathing, no distress  Neuro: Alert and oriented x3, chronic deficits related to prior stroke  Skin: Warm, dry      Assessment & Plan:   See Encounters Tab for problem based charting.  Patient discussed with Dr. Angelia Mould

## 2017-07-24 NOTE — Progress Notes (Signed)
INTERNAL MEDICINE TEACHING ATTENDING ADDENDUM - [name], DO Duration- indefinate, Indication- VTE, embolic CVA, INR-  Lab Results  Component Value Date   INR 2.4 07/24/2017  . Agree with pharmacy recommendations as outlined in their note.

## 2017-07-24 NOTE — Progress Notes (Signed)
DDI potential for levofloxicin + warfarin (levo commenced by clinic provider the patient saw after our appointment, this provider came to the anticoagulation clinic--being aware of the DDI and sought our advice as to how to adjust the warfarin dose. Patient was instructed to OMIT empirically TWO days/doses of warfarin, then--to take 1 tablet of his 5mg  peach-colored warfarin, alternating with 1/2 tablet (2.5mg ) every-other-day and to return to the clinic in 1 week.

## 2017-07-31 ENCOUNTER — Ambulatory Visit (INDEPENDENT_AMBULATORY_CARE_PROVIDER_SITE_OTHER): Payer: Medicare HMO | Admitting: Pharmacist

## 2017-07-31 DIAGNOSIS — Z7901 Long term (current) use of anticoagulants: Secondary | ICD-10-CM

## 2017-07-31 DIAGNOSIS — Z86718 Personal history of other venous thrombosis and embolism: Secondary | ICD-10-CM | POA: Diagnosis not present

## 2017-07-31 LAB — POCT INR: INR: 1.5

## 2017-07-31 NOTE — Patient Instructions (Signed)
Patient instructed to take medications as defined in the Anti-coagulation Track section of this encounter.  Patient instructed to take today's dose.  Patient instructed to take 1 and 1/2 tablets of your 5mg  peach-colored warfarin by mouth once daily on Mondays, Wednesdays, and Fridays - on all other days, take 1 tablet by mouth once daily. Patient verbalized understanding of these instructions.

## 2017-07-31 NOTE — Progress Notes (Signed)
Anticoagulation Management Alexander English is a 62 y.o. male who reports to the clinic for monitoring of warfarin treatment.    Indication: DVT  Duration: indefinite Supervising physician: [facility] Visit History: Patient does not report signs/symptoms of bleeding or thromboembolism  Other recent changes: Course of levofloxacin completed on 2/3. No other changes in diet, medications, or lifestyle reported.  Anticoagulation Episode Summary    Current INR goal:   2.0-3.0  TTR:   83.6 % (5.4 y)  Next INR check:   08/21/2017  INR from last check:   1.5! (07/31/2017)  Weekly max warfarin dose:     Target end date:   Indefinite  INR check location:   [facility]  Preferred lab:     Send INR reminders to:   ANTICOAG [facility]   Indications   History of venous thromboembolism [Z86.718]       Comments:           No Known Allergies Prior to Admission medications   Medication Sig Start Date End Date Taking? Authorizing Provider  aspirin EC 81 MG tablet Take 81 mg by mouth daily.    [provider]  atorvastatin (LIPITOR) 10 MG tablet Take 1 tablet (10 mg total) daily by mouth. 05/12/17   [name], MD  benzoyl peroxide 5 % external liquid Apply topically 2 (two) times daily. 10/24/16   [name], MD  levofloxacin (LEVAQUIN) 750 MG tablet Take 1 tablet (750 mg total) by mouth daily for 7 days. 07/24/17 07/31/17  Tawny Asal, MD  mirabegron ER (MYRBETRIQ) 25 MG TB24 tablet Take 25 mg by mouth daily.    [provider]  Multiple Vitamin (MULTIVITAMIN) tablet Take 1 tablet by mouth daily.    [provider]  oxyCODONE-acetaminophen (PERCOCET/ROXICET) 5-325 MG tablet Take 1-2 tablets by mouth daily as needed for severe pain. 03/31/17   [name], MD  sorbitol 70 % solution Take 15 mLs by mouth daily as needed. 03/31/17   [name], MD  warfarin (COUMADIN) 5 MG tablet Take 1 & 1/2 tablets on Mondays and Thursdays;  all other days, take one (1) tablet by mouth at 6PM. 06/12/17   [name], RPH-CPP   Past Medical History:  Diagnosis Date  . Bilateral cataracts 05/18/2016   Not visually significant  . Chronic constipation 06/12/2013  . Chronic low back pain 06/12/2013   L4-5 facet arthropathy   . Chronic pain of both shoulders 12/07/2013   A/C joint osteoarthritis   . Cluster headaches    Resolved in 2006  . Dry eye syndrome, bilateral 05/18/2016  . Embolic stroke involving right middle cerebral artery ([place]) 08/08/2004   Occured after traveling from [place] to [place] where he was hospitalized for 1 month with no further work-up or therapy.  Returned to [place] unable to walk and was admitted to [facility] immediately after landing. Presumed embolic per neuro but TEE, bubble study, and hypercoag W/U negative. On indefinite coumadin per patient's informed preference.  Residual effects : Left spastic hemiplegia. Tx with phenol tibi  . Hyperplastic colon polyp 07/27/2012   Excised endoscopically 07/27/2012  . Hypertensive retinopathy of both eyes 05/18/2016  . Left nephrolithiasis 08/31/2015   With mild left hydronephrosis.  Scheduled to undergo shockwave lithotripsy.  . Obesity (BMI 30.0-34.9) 12/07/2013  . Osteoarthritis of both knees 01/26/2012  . Positive PPD 12/07/2013  . Pulmonary embolism ([place]) 09/30/2004   Diagnosed in April 2006 after returning from [place].  Likely provoked as  it occurred after a 12 hour plane flight within 2 months of R MCA CVA with left hemiparesis. Patient has made an informed decision to remain on lifelong warfarin.   . Urge incontinence of urine 08/31/2015   Possibly secondary to prior CVA.  Treated with Myrbetriq.   Social History   Socioeconomic History  . Marital status: Divorced    Spouse name: Not on file  . Number of children: Not on file  . Years of education: Not on file  . Highest education level: Not on file  Social Needs  . Financial resource strain: Not on file  . Food  insecurity - worry: Not on file  . Food insecurity - inability: Not on file  . Transportation needs - medical: Not on file  . Transportation needs - non-medical: Not on file  Occupational History  . Not on file  Tobacco Use  . Smoking status: Former Smoker    Last attempt to quit: 06/28/2007    Years since quitting: 10.0  . Smokeless tobacco: Never Used  Substance and Sexual Activity  . Alcohol use: No    Alcohol/week: 0.0 oz    Comment: Remote past  . Drug use: No  . Sexual activity: Not on file  Other Topics Concern  . Not on file  Social History Narrative   Born in [place] but has been in the [place] for > 30 years.  Divorced, 1 daughter and 2 sons.  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   Family History  Problem Relation Age of Onset  . Stroke Maternal Grandmother   . Unexplained death Mother   . Depression Mother        After husband's death  . Unexplained death Father   . Osteoarthritis Father        Knees  . Early death Sister   . Seizures Sister   . Early death Brother 10       Unknown cause  . Healthy Daughter   . Healthy Son   . Stroke Maternal Aunt   . Healthy Sister   . Healthy Sister   . Unexplained death Brother   . Healthy Brother   . Healthy Son     ASSESSMENT Recent Results: The most recent result is correlated with 20 mg per week: Lab Results  Component Value Date   INR 1.5 07/31/2017   INR 2.4 07/24/2017   INR 1.50 07/13/2017    Anticoagulation Dosing: Description   Take 1 and 1/2 tablets of your 5mg  peach-colored warfarin by mouth once daily on Mondays, Wednesdays, and Fridays - on all other days, take 1 tablet by mouth once daily.     INR today: Subtherapeutic  DDI potential for levofloxicin + warfarin. Patient was instructed to OMIT empirically TWO days/doses of warfarin, then--to take 1 tablet of his 5mg  peach-colored warfarin, alternating with 1/2 tablet (2.5mg ) every-other-day (20 mg  total/week) and to return to the clinic in 1 week.  PLAN Weekly dose was increased by 112% to 42.5 mg per week  Patient Instructions  Patient instructed to take medications as defined in the Anti-coagulation Track section of this encounter.  Patient instructed to take today's dose.  Patient instructed to take 1 and 1/2 tablets of your 5mg  peach-colored warfarin by mouth once daily on Mondays, Wednesdays, and Fridays - on all other days, take 1 tablet by mouth once daily. Patient verbalized understanding of these instructions.     Patient advised  to contact clinic or seek medical attention if signs/symptoms of bleeding or thromboembolism occur.  Patient verbalized understanding by repeating back information and was advised to contact me if further medication-related questions arise. Patient was also provided an information handout.  Follow-up Return in 3 weeks (on 08/21/2017) for Follow up INR at 0900.  Mila Merry Gerarda Fraction, PharmD PGY1 Pharmacy Resident Pager: [phone]  15 minutes spent face-to-face with the patient during the encounter. 50% of time spent on education. 50% of time was spent on point of care INR testing, results interpretation, dose adjustment, and documentation in [facility] and [email or web address]

## 2017-08-14 ENCOUNTER — Ambulatory Visit: Payer: Medicare HMO

## 2017-08-15 ENCOUNTER — Ambulatory Visit (INDEPENDENT_AMBULATORY_CARE_PROVIDER_SITE_OTHER): Payer: Medicare HMO | Admitting: Internal Medicine

## 2017-08-15 ENCOUNTER — Other Ambulatory Visit: Payer: Self-pay

## 2017-08-15 ENCOUNTER — Encounter: Payer: Self-pay | Admitting: Internal Medicine

## 2017-08-15 VITALS — BP 154/99 | HR 93 | Temp 98.7°F | Wt 237.7 lb

## 2017-08-15 DIAGNOSIS — D098 Carcinoma in situ of other specified sites: Secondary | ICD-10-CM | POA: Diagnosis not present

## 2017-08-15 DIAGNOSIS — R040 Epistaxis: Secondary | ICD-10-CM | POA: Diagnosis not present

## 2017-08-15 DIAGNOSIS — C300 Malignant neoplasm of nasal cavity: Secondary | ICD-10-CM | POA: Diagnosis not present

## 2017-08-15 LAB — CBC
HCT: 44.2 % (ref 39.0–52.0)
Hemoglobin: 14.1 g/dL (ref 13.0–17.0)
MCH: 25.4 pg — ABNORMAL LOW (ref 26.0–34.0)
MCHC: 31.9 g/dL (ref 30.0–36.0)
MCV: 79.6 fL (ref 78.0–100.0)
Platelets: 269 10*3/uL (ref 150–400)
RBC: 5.55 MIL/uL (ref 4.22–5.81)
RDW: 15.1 % (ref 11.5–15.5)
WBC: 7 10*3/uL (ref 4.0–10.5)

## 2017-08-15 LAB — PROTIME-INR
INR: 2.45
Prothrombin Time: 26.3 seconds — ABNORMAL HIGH (ref 11.4–15.2)

## 2017-08-15 NOTE — Progress Notes (Signed)
CC: Epistaxis  HPI:  Alexander English is a 62 y.o. male with a past medical history listed below here today for epistaxis.   He reports that he was seen on 07/24/17 with complaints of nasal congestion and facial pressure.  He was told he had a bacterial sinus infection at that time and given a 5-day course of Augmentin.  He notes that his symptoms of nasal congestion and left-sided sinus pressure have not improved despite the course of antibiotics.  He denies any fevers chills nausea vomiting.  He notes that for the past roughly 10 days he has experienced profuse bleeding from the left nostril.  He has a history of recurrent epistaxis.  He has been evaluated by ENT in the past without any explanation of these episodes.  He was recommended to use a humidifier as it was thought to be possibly related to drying of the nasal mucosa.  He reports that it only really occurs on the left nostril.  For the past week to 10 days he has had frequent episodes of large-volume nosebleeds with clots present.  He has been able to temporarily stop them with prolonged pressure but they seem to recur with any increase in intracranial pressure.  This is complicated by the fact that he is on aspirin and warfarin for history of VTE.  He denies any other bleeding.  No chest pain, shortness of breath palpitations, dizziness lightheadedness.  Past Medical History:  Diagnosis Date  . Bilateral cataracts 05/18/2016   Not visually significant  . Chronic constipation 06/12/2013  . Chronic low back pain 06/12/2013   L4-5 facet arthropathy   . Chronic pain of both shoulders 12/07/2013   A/C joint osteoarthritis   . Cluster headaches    Resolved in 2006  . Dry eye syndrome, bilateral 05/18/2016  . Embolic stroke involving right middle cerebral artery ([place]) 08/08/2004   Occured after traveling from [place] to [place] where he was hospitalized for 1 month with no further work-up or therapy.  Returned to [place] unable to walk and was  admitted to [facility] immediately after landing. Presumed embolic per neuro but TEE, bubble study, and hypercoag W/U negative. On indefinite coumadin per patient's informed preference.  Residual effects : Left spastic hemiplegia. Tx with phenol tibi  . Hyperplastic colon polyp 07/27/2012   Excised endoscopically 07/27/2012  . Hypertensive retinopathy of both eyes 05/18/2016  . Left nephrolithiasis 08/31/2015   With mild left hydronephrosis.  Scheduled to undergo shockwave lithotripsy.  . Obesity (BMI 30.0-34.9) 12/07/2013  . Osteoarthritis of both knees 01/26/2012  . Positive PPD 12/07/2013  . Pulmonary embolism ([place]) 09/30/2004   Diagnosed in April 2006 after returning from [place].  Likely provoked as it occurred after a 12 hour plane flight within 2 months of R MCA CVA with left hemiparesis. Patient has made an informed decision to remain on lifelong warfarin.   . Urge incontinence of urine 08/31/2015   Possibly secondary to prior CVA.  Treated with Myrbetriq.   Review of Systems:   Negative except as noted in HPI  Physical Exam:  Vitals:   08/15/17 0922  BP: (!) 154/99  Pulse: 93  Temp: 98.7 F (37.1 C)  TempSrc: Oral  SpO2: 100%  Weight: 237 lb 11.2 oz (107.8 kg)   GENERAL- alert, co-operative, appears as stated age, not in any distress. HEENT- Atraumatic, normocephalic, PERRL, EOMI, oral mucosa appears moist. Epistaxis from left nostril. No source could be visualized.  CARDIAC- RRR, no murmurs, rubs or gallops.  RESP- Moving equal volumes of air, and clear to auscultation bilaterally, no wheezes or crackles. ABDOMEN- Soft, nontender, bowel sounds present. EXTREMITIES- pulse 2+, symmetric, no pedal edema. SKIN- Warm, dry, No rash or lesion. PSYCH- Normal mood and affect, appropriate thought content and speech.   Assessment & Plan:   See Encounters Tab for problem based charting.  Patient discussed with Dr. Lynnae January

## 2017-08-15 NOTE — Progress Notes (Signed)
Internal Medicine Clinic Attending  Case discussed with Dr. Boswell at the time of the visit.  We reviewed the resident's history and exam and pertinent patient test results.  I agree with the assessment, diagnosis, and plan of care documented in the resident's note.  

## 2017-08-15 NOTE — Assessment & Plan Note (Signed)
Patient with recurrent episodes of epistaxis that has been persistent for the past week to 10 days.  Episodes have been intermittent and minimally responsive to pressure.  Initially he had no bleeding when interviewing patient.  However, during our interview he developed bleeding with bright red blood and clotting from his left nostril.   Hemoglobin today is 14.1 and INR is 2.4.  We were able to successfully stop the bleeding with pressure in clinic today after after 30 minutes of pressure.  However given his frequent recurrence and his anticoagulation will arrange for follow-up with ENT.  Discussed with their office and they have an appointment available this afternoon at 1:20 p.m.  Patient reports he is comfortable leaving the office and seeing them this afternoon for further evaluation.

## 2017-08-15 NOTE — Patient Instructions (Signed)
Alexander English,  I am sorry your are having this bleeding. We got you an appointment to see the ENT doctors this afternoon at 1:20 PM. If you have any problems please let us know or go to the ED if your bleeding is uncontrollable.

## 2017-08-21 ENCOUNTER — Other Ambulatory Visit: Payer: Self-pay

## 2017-08-21 ENCOUNTER — Telehealth: Payer: Self-pay | Admitting: *Deleted

## 2017-08-21 ENCOUNTER — Ambulatory Visit (INDEPENDENT_AMBULATORY_CARE_PROVIDER_SITE_OTHER): Payer: Medicare HMO | Admitting: Internal Medicine

## 2017-08-21 ENCOUNTER — Ambulatory Visit (INDEPENDENT_AMBULATORY_CARE_PROVIDER_SITE_OTHER): Payer: Medicare HMO | Admitting: Pharmacist

## 2017-08-21 ENCOUNTER — Encounter: Payer: Self-pay | Admitting: Internal Medicine

## 2017-08-21 VITALS — BP 130/76 | HR 74 | Temp 98.3°F | Ht 72.0 in | Wt 240.6 lb

## 2017-08-21 DIAGNOSIS — R040 Epistaxis: Secondary | ICD-10-CM | POA: Diagnosis not present

## 2017-08-21 DIAGNOSIS — Z7901 Long term (current) use of anticoagulants: Secondary | ICD-10-CM

## 2017-08-21 DIAGNOSIS — Z5181 Encounter for therapeutic drug level monitoring: Secondary | ICD-10-CM | POA: Diagnosis not present

## 2017-08-21 DIAGNOSIS — Z86718 Personal history of other venous thrombosis and embolism: Secondary | ICD-10-CM

## 2017-08-21 LAB — POCT INR: INR: 1.9

## 2017-08-21 NOTE — Assessment & Plan Note (Signed)
Patient is here for a follow-up of epistaxis.  During his previous visit, he was noticed to have bleeding from his left nostril.  Hemoglobin checked at that visit was normal at 14.1. He is on anticoagulation (Coumadin) due to history of VTE, INR checked during his previous visit was therapeutic at 2.45.  He was referred to ENT at that time.  Patient states ENT did "cauterization" and since then the bleeding has resolved.  However, he feels stuffiness/dryness in his left nostril, and as such, has been putting tissue papers in the nostril.  States he notices a very small amount of blood/ dried blood on the tissue whenever he takes it out.  Denies having any gross bleeding from the nostril.  He has a follow-up appointment with ENT on February 28.  Plan -Encouraged him to go to his next ENT appointment -Advised him to refrain from putting tissue paper in his nostril as it may irritate the mucosa -Advised him to call ENT if he experiences gross bleeding from his nostril again.

## 2017-08-21 NOTE — Progress Notes (Signed)
I reviewed the coumadin clinic note and patient is on [facility] for VTE.  INR was 1.9 but due to recent bleeding, dose was unchanged.

## 2017-08-21 NOTE — Patient Instructions (Addendum)
Alexander English it was nice meeting you today.  -Please refrain from putting tissue paper in your nostril as it may irritate the area and cause more bleeding.  -Please go to your appointment with ENT on February 28.  If you notice blood pouring down your nostril, please call them sooner.  FOLLOW-UP INSTRUCTIONS When: 09/08/2017 at 10:15 am  For: Regular checkup with Dr. Eppie Gibson What to bring: Medications

## 2017-08-21 NOTE — Progress Notes (Signed)
   CC: Follow-up of a nosebleed  HPI:  [name] is a 62 y.o. male with a past medical history of conditions listed below presenting to the clinic for a follow-up of a nosebleed. Please see problem based charting for the status of the patient's current and chronic medical conditions.   Past Medical History:  Diagnosis Date  . Bilateral cataracts 05/18/2016   Not visually significant  . Chronic constipation 06/12/2013  . Chronic low back pain 06/12/2013   L4-5 facet arthropathy   . Chronic pain of both shoulders 12/07/2013   A/C joint osteoarthritis   . Cluster headaches    Resolved in 2006  . Dry eye syndrome, bilateral 05/18/2016  . Embolic stroke involving right middle cerebral artery ([place]) 08/08/2004   Occured after traveling from [place] to [place] where he was hospitalized for 1 month with no further work-up or therapy.  Returned to [place] unable to walk and was admitted to [facility] immediately after landing. Presumed embolic per neuro but TEE, bubble study, and hypercoag W/U negative. On indefinite coumadin per patient's informed preference.  Residual effects : Left spastic hemiplegia. Tx with phenol tibi  . Hyperplastic colon polyp 07/27/2012   Excised endoscopically 07/27/2012  . Hypertensive retinopathy of both eyes 05/18/2016  . Left nephrolithiasis 08/31/2015   With mild left hydronephrosis.  Scheduled to undergo shockwave lithotripsy.  . Obesity (BMI 30.0-34.9) 12/07/2013  . Osteoarthritis of both knees 01/26/2012  . Positive PPD 12/07/2013  . Pulmonary embolism ([place]) 09/30/2004   Diagnosed in April 2006 after returning from [place].  Likely provoked as it occurred after a 12 hour plane flight within 2 months of R MCA CVA with left hemiparesis. Patient has made an informed decision to remain on lifelong warfarin.   . Urge incontinence of urine 08/31/2015   Possibly secondary to prior CVA.  Treated with Myrbetriq.   Review of Systems: Pertinent positives mentioned in HPI. Remainder of  all ROS negative.   Physical Exam:  Vitals:   08/21/17 1003  BP: 130/76  Pulse: 74  Temp: 98.3 F (36.8 C)  TempSrc: Oral  SpO2: 100%  Weight: 240 lb 9.6 oz (109.1 kg)  Height: 6' (1.829 m)   Physical Exam  Constitutional: He is oriented to person, place, and time. He appears well-developed and well-nourished. No distress.  HENT:  Head: Normocephalic and atraumatic.  Mouth/Throat: Oropharynx is clear and moist.  Minimal amount of dried blood noticed in the left nostril.   Eyes: Right eye exhibits no discharge. Left eye exhibits no discharge.  Cardiovascular: Normal rate, regular rhythm and intact distal pulses.  Pulmonary/Chest: Effort normal and breath sounds normal. No respiratory distress. He has no wheezes. He has no rales.  Abdominal: Soft. Bowel sounds are normal. He exhibits no distension. There is no tenderness.  Neurological: He is alert and oriented to person, place, and time.  Skin: Skin is warm and dry.  Psychiatric: His behavior is normal.    Assessment & Plan:   See Encounters Tab for problem based charting.  Patient discussed with Dr. Daryll Drown

## 2017-08-21 NOTE — Patient Instructions (Signed)
Patient instructed to take medications as defined in the Anti-coagulation Track section of this encounter.  Patient instructed to take today's dose.  Patient instructed to take 1 and 1/2 tablets of your 5mg  peach-colored warfarin by mouth once daily on Mondays, Wednesdays, and Fridays - on all other days, take 1 tablet by mouth once daily. Patient verbalized understanding of these instructions.

## 2017-08-21 NOTE — Telephone Encounter (Signed)
Pt presents c/o continued nosebleeds and nose "plugged up", he is worried, wants to be seen placed in [facility] for 0945

## 2017-08-21 NOTE — Progress Notes (Signed)
Anticoagulation Management Alexander English is a 62 y.o. male who reports to the clinic for monitoring of warfarin treatment.    Indication: DVT  Duration: indefinite Supervising physician: Gilles Chiquito  Anticoagulation Clinic Visit History: Patient does not report signs/symptoms of bleeding or thromboembolism  Other recent changes: Increased intake of spinach yesterday; otherwise, no reported changes in diet, medications, lifestyle Anticoagulation Episode Summary    Current INR goal:   2.0-3.0  TTR:   83.3 % (5.4 y)  Next INR check:   09/11/2017  INR from last check:   1.9! (08/21/2017)  Weekly max warfarin dose:     Target end date:   Indefinite  INR check location:   [facility]  Preferred lab:     Send INR reminders to:   ANT[facility]   Indications   History of venous thromboembolism [Z86.718]       Comments:           No Known Allergies Prior to Admission medications   Medication Sig Start Date End Date Taking? Authorizing Provider  aspirin EC 81 MG tablet Take 81 mg by mouth daily.   Yes [provider]  atorvastatin (LIPITOR) 10 MG tablet Take 1 tablet (10 mg total) daily by mouth. 05/12/17  Yes [name], MD  benzoyl peroxide 5 % external liquid Apply topically 2 (two) times daily. 10/24/16  Yes [name], MD  mirabegron ER (MYRBETRIQ) 25 MG TB24 tablet Take 25 mg by mouth daily.   Yes [provider]  Multiple Vitamin (MULTIVITAMIN) tablet Take 1 tablet by mouth daily.   Yes [provider]  oxyCODONE-acetaminophen (PERCOCET/ROXICET) 5-325 MG tablet Take 1-2 tablets by mouth daily as needed for severe pain. 03/31/17  Yes [name], MD  sorbitol 70 % solution Take 15 mLs by mouth daily as needed. 03/31/17  Yes [name], MD  warfarin (COUMADIN) 5 MG tablet Take 1 & 1/2 tablets on Mondays and Thursdays; all other days, take one (1) tablet by mouth at 6PM. 06/12/17  Yes [name], RPH-CPP   Past Medical History:   Diagnosis Date  . Bilateral cataracts 05/18/2016   Not visually significant  . Chronic constipation 06/12/2013  . Chronic low back pain 06/12/2013   L4-5 facet arthropathy   . Chronic pain of both shoulders 12/07/2013   A/C joint osteoarthritis   . Cluster headaches    Resolved in 2006  . Dry eye syndrome, bilateral 05/18/2016  . Embolic stroke involving right middle cerebral artery ([place]) 08/08/2004   Occured after traveling from [place] to [place] where he was hospitalized for 1 month with no further work-up or therapy.  Returned to [place] unable to walk and was admitted to [facility] immediately after landing. Presumed embolic per neuro but TEE, bubble study, and hypercoag W/U negative. On indefinite coumadin per patient's informed preference.  Residual effects : Left spastic hemiplegia. Tx with phenol tibi  . Hyperplastic colon polyp 07/27/2012   Excised endoscopically 07/27/2012  . Hypertensive retinopathy of both eyes 05/18/2016  . Left nephrolithiasis 08/31/2015   With mild left hydronephrosis.  Scheduled to undergo shockwave lithotripsy.  . Obesity (BMI 30.0-34.9) 12/07/2013  . Osteoarthritis of both knees 01/26/2012  . Positive PPD 12/07/2013  . Pulmonary embolism ([place]) 09/30/2004   Diagnosed in April 2006 after returning from [place].  Likely provoked as it occurred after a 12 hour plane flight within 2 months of R MCA CVA with left hemiparesis. Patient has made an informed decision to remain on lifelong  warfarin.   . Urge incontinence of urine 08/31/2015   Possibly secondary to prior CVA.  Treated with Myrbetriq.   Social History   Socioeconomic History  . Marital status: Divorced    Spouse name: Not on file  . Number of children: Not on file  . Years of education: Not on file  . Highest education level: Not on file  Social Needs  . Financial resource strain: Not on file  . Food insecurity - worry: Not on file  . Food insecurity - inability: Not on file  . Transportation needs - medical: Not on  file  . Transportation needs - non-medical: Not on file  Occupational History  . Not on file  Tobacco Use  . Smoking status: Former Smoker    Last attempt to quit: 06/28/2007    Years since quitting: 10.1  . Smokeless tobacco: Never Used  Substance and Sexual Activity  . Alcohol use: No    Alcohol/week: 0.0 oz    Comment: Remote past  . Drug use: No  . Sexual activity: Not on file  Other Topics Concern  . Not on file  Social History Narrative   Born in [place] but has been in the [place] for > 30 years.  Divorced, 1 daughter and 2 sons.  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   Family History  Problem Relation Age of Onset  . Stroke Maternal Grandmother   . Unexplained death Mother   . Depression Mother        After husband's death  . Unexplained death Father   . Osteoarthritis Father        Knees  . Early death Sister   . Seizures Sister   . Early death Brother 19       Unknown cause  . Healthy Daughter   . Healthy Son   . Stroke Maternal Aunt   . Healthy Sister   . Healthy Sister   . Unexplained death Brother   . Healthy Brother   . Healthy Son     ASSESSMENT Recent Results: The most recent result is correlated with 42.5 mg per week: Lab Results  Component Value Date   INR 1.9 08/21/2017   INR 2.45 08/15/2017   INR 1.5 07/31/2017    Anticoagulation Dosing: Description   Take 1 and 1/2 tablets of your 5mg  peach-colored warfarin by mouth once daily on Mondays, Wednesdays, and Fridays - on all other days, take 1 tablet by mouth once daily.     INR today: Subtherapeutic  PLAN Weekly dose was unchanged. INR was 2.45 on same regimen at another appointment on 08/15/17. Suspect low INR was related to increased intake of leafy greens.  Patient Instructions  Patient instructed to take medications as defined in the Anti-coagulation Track section of this encounter.  Patient instructed to take today's dose.  Patient  instructed to take 1 and 1/2 tablets of your 5mg  peach-colored warfarin by mouth once daily on Mondays, Wednesdays, and Fridays - on all other days, take 1 tablet by mouth once daily. Patient verbalized understanding of these instructions.  Patient advised to contact clinic or seek medical attention if signs/symptoms of bleeding or thromboembolism occur.  Patient verbalized understanding by repeating back information and was advised to contact me if further medication-related questions arise. Patient was also provided an information handout.  Follow-up Return in 3 weeks (on 09/11/2017) for Follow up INR at 0930.  Mila Merry Gerarda Fraction, PharmD PGY1  Pharmacy Resident Pager: [phone]  15 minutes spent face-to-face with the patient during the encounter. 50% of time spent on education. 50% of time was spent on point of care INR testing, results interpretation, dose adjustment, and documentation in [facility] and [email or web address]

## 2017-08-22 NOTE — Progress Notes (Signed)
Internal Medicine Clinic Attending  Case discussed with Dr. Rathoreat the time of the visit. We reviewed the resident's history and exam and pertinent patient test results. I agree with the assessment, diagnosis, and plan of care documented in the resident's note.  

## 2017-08-29 DIAGNOSIS — Z8673 Personal history of transient ischemic attack (TIA), and cerebral infarction without residual deficits: Secondary | ICD-10-CM | POA: Diagnosis not present

## 2017-08-29 DIAGNOSIS — Z7901 Long term (current) use of anticoagulants: Secondary | ICD-10-CM | POA: Diagnosis not present

## 2017-08-29 DIAGNOSIS — D140 Benign neoplasm of middle ear, nasal cavity and accessory sinuses: Secondary | ICD-10-CM | POA: Diagnosis not present

## 2017-08-29 DIAGNOSIS — Z5181 Encounter for therapeutic drug level monitoring: Secondary | ICD-10-CM | POA: Diagnosis not present

## 2017-08-30 ENCOUNTER — Telehealth: Payer: Self-pay | Admitting: Internal Medicine

## 2017-08-30 NOTE — Telephone Encounter (Signed)
[place], DOING SINUS SURGERY NEEDS TO KNOW ABOUT WHEN TO STOP AND START BACK HIS COUMADIN, HER DIRECT # [phone]

## 2017-08-31 NOTE — Telephone Encounter (Addendum)
Spoke to patient, surgery has not been scheduled yet. He plans to discuss further at [facility] appointment 09/08/17. I will be happy to help if needed---please let me know. Thank you

## 2017-09-07 ENCOUNTER — Encounter: Payer: Self-pay | Admitting: Internal Medicine

## 2017-09-07 NOTE — Progress Notes (Signed)
Received a fax from [place] asking when Mr. Shaddix should stop his warfarin prior to his planned sinus surgery.  I called the number and received a voice mail.  I left a message with the following recommendations:  Stop the warfarin 5 days prior to the planned surgery.  Start the warfarin back immediately post-op, but I would leave the final decision to the start to the surgeon.  This message was also faxed back to the practice.

## 2017-09-08 ENCOUNTER — Ambulatory Visit (INDEPENDENT_AMBULATORY_CARE_PROVIDER_SITE_OTHER): Payer: Medicare HMO | Admitting: Internal Medicine

## 2017-09-08 ENCOUNTER — Encounter: Payer: Self-pay | Admitting: Internal Medicine

## 2017-09-08 ENCOUNTER — Ambulatory Visit (INDEPENDENT_AMBULATORY_CARE_PROVIDER_SITE_OTHER): Payer: Medicare HMO | Admitting: Pharmacist

## 2017-09-08 VITALS — BP 139/87 | HR 83 | Wt 237.1 lb

## 2017-09-08 DIAGNOSIS — M172 Bilateral post-traumatic osteoarthritis of knee: Secondary | ICD-10-CM

## 2017-09-08 DIAGNOSIS — E785 Hyperlipidemia, unspecified: Secondary | ICD-10-CM

## 2017-09-08 DIAGNOSIS — K5909 Other constipation: Secondary | ICD-10-CM | POA: Diagnosis not present

## 2017-09-08 DIAGNOSIS — G8194 Hemiplegia, unspecified affecting left nondominant side: Secondary | ICD-10-CM | POA: Diagnosis not present

## 2017-09-08 DIAGNOSIS — C310 Malignant neoplasm of maxillary sinus: Secondary | ICD-10-CM | POA: Insufficient documentation

## 2017-09-08 DIAGNOSIS — Z86718 Personal history of other venous thrombosis and embolism: Secondary | ICD-10-CM | POA: Diagnosis not present

## 2017-09-08 DIAGNOSIS — N3941 Urge incontinence: Secondary | ICD-10-CM | POA: Diagnosis not present

## 2017-09-08 DIAGNOSIS — F419 Anxiety disorder, unspecified: Secondary | ICD-10-CM | POA: Diagnosis not present

## 2017-09-08 DIAGNOSIS — M545 Low back pain, unspecified: Secondary | ICD-10-CM

## 2017-09-08 DIAGNOSIS — M170 Bilateral primary osteoarthritis of knee: Secondary | ICD-10-CM | POA: Diagnosis not present

## 2017-09-08 DIAGNOSIS — M25512 Pain in left shoulder: Secondary | ICD-10-CM

## 2017-09-08 DIAGNOSIS — R450 Nervousness: Secondary | ICD-10-CM | POA: Diagnosis not present

## 2017-09-08 DIAGNOSIS — D140 Benign neoplasm of middle ear, nasal cavity and accessory sinuses: Secondary | ICD-10-CM

## 2017-09-08 DIAGNOSIS — G8929 Other chronic pain: Secondary | ICD-10-CM | POA: Diagnosis not present

## 2017-09-08 DIAGNOSIS — Z5181 Encounter for therapeutic drug level monitoring: Secondary | ICD-10-CM

## 2017-09-08 DIAGNOSIS — R972 Elevated prostate specific antigen [PSA]: Secondary | ICD-10-CM

## 2017-09-08 DIAGNOSIS — M25511 Pain in right shoulder: Secondary | ICD-10-CM

## 2017-09-08 DIAGNOSIS — Z125 Encounter for screening for malignant neoplasm of prostate: Secondary | ICD-10-CM | POA: Diagnosis not present

## 2017-09-08 DIAGNOSIS — D023 Carcinoma in situ of other parts of respiratory system: Secondary | ICD-10-CM

## 2017-09-08 DIAGNOSIS — M19012 Primary osteoarthritis, left shoulder: Secondary | ICD-10-CM

## 2017-09-08 DIAGNOSIS — Z7982 Long term (current) use of aspirin: Secondary | ICD-10-CM

## 2017-09-08 DIAGNOSIS — E7800 Pure hypercholesterolemia, unspecified: Secondary | ICD-10-CM

## 2017-09-08 DIAGNOSIS — Z7901 Long term (current) use of anticoagulants: Secondary | ICD-10-CM

## 2017-09-08 DIAGNOSIS — Z122 Encounter for screening for malignant neoplasm of respiratory organs: Secondary | ICD-10-CM

## 2017-09-08 DIAGNOSIS — M19011 Primary osteoarthritis, right shoulder: Secondary | ICD-10-CM

## 2017-09-08 DIAGNOSIS — Z79899 Other long term (current) drug therapy: Secondary | ICD-10-CM

## 2017-09-08 DIAGNOSIS — Z87891 Personal history of nicotine dependence: Secondary | ICD-10-CM | POA: Diagnosis not present

## 2017-09-08 LAB — POCT INR: INR: 2.9

## 2017-09-08 NOTE — Patient Instructions (Signed)
Patient instructed to take medications as defined in the Anti-coagulation Track section of this encounter.  Patient instructed to take today's dose.  Patient instructed to take 1 and 1/2 tablets of your 5mg  peach-colored warfarin by mouth once daily on Mondays and Wednesdays. All other days, take 1 tablet by mouth once daily Patient verbalized understanding of these instructions.

## 2017-09-08 NOTE — Assessment & Plan Note (Signed)
Assessment  Since using the new left sided leg brace he has noted increased pain in the anterior and posterior aspects of the left pelvic area. This is not in the area of a bursabut more associated with anterior and posterior muscular pain. I believe this pain is muscular rather than arthritic.  Plan  In the future we may try something like Voltaren gel or return to another oral NSAID, being very careful given his chronic warfarin therapy. I advised him not to start a nonsteroidal anti-inflammatory at this time as I did not want to take any chances on delaying surgery. He agreed with this strategy and we will readdress the left pelvic pain at the return visit after he has the sinus surgery.

## 2017-09-08 NOTE — Assessment & Plan Note (Signed)
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 This likely contributed to his epistaxis. He has been seen by ENT and the plan is for sinus surgery although it has yet to be scheduled. Obviously, he is very nervous about this recent diagnosis and is interested in getting more definitive answers. We discussed the importance of undergoing the surgery to assure that all of the cancer was removed. This will give [place] a better idea of his prognosis moving forward.  Plan  He hopes to have the sinus surgery scheduled within the next 2 weeks. He knows to hold his aspirin for 10 days prior to the surgery and I advised him to hold it beginning today. He was also advised to hold the Coumadin for 5 days prior to surgery and then restart it immediately after the surgery if approved by the surgeon. He asked for a follow-up appointment after the surgery so we could review the findings and discuss the plan moving forward.  This will be scheduled.

## 2017-09-08 NOTE — Progress Notes (Signed)
   Subjective:    Patient ID: Alexander English, male    DOB: 1955/08/29, 62 y.o.   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 Please see the A&P for the status of the pt's chronic medical problems.  Review of Systems  Constitutional: Negative for activity change, appetite change and unexpected weight change.  HENT: Positive for congestion, nosebleeds, sinus pressure and sinus pain.   Respiratory: Negative for chest tightness, shortness of breath and wheezing.   Cardiovascular: Negative for chest pain.  Gastrointestinal: Positive for constipation. Negative for abdominal pain, diarrhea, nausea and vomiting.  Musculoskeletal: Positive for arthralgias, back pain, gait problem and myalgias.  Skin: Negative for rash and wound.  Hematological: Bruises/bleeds easily.  Psychiatric/Behavioral: Positive for decreased concentration. The patient is nervous/anxious.       Objective:   Physical Exam  Constitutional: He is oriented to person, place, and time. He appears well-developed and well-nourished. No distress.  HENT:  Head: Normocephalic and atraumatic.  Eyes: Conjunctivae are normal. Right eye exhibits no discharge. Left eye exhibits no discharge. No scleral icterus.  Musculoskeletal: He exhibits tenderness and deformity.  Neurological: He is alert and oriented to person, place, and time. He exhibits abnormal muscle tone.  Skin: Skin is warm and dry. No rash noted. He is not diaphoretic. No erythema.  Psychiatric: He has a normal mood and affect. His behavior is normal. Judgment and thought content normal.  Nursing note and vitals reviewed.     Assessment & Plan:   Please see problem oriented charting.

## 2017-09-08 NOTE — Assessment & Plan Note (Signed)
With the recent scare of the squamous cell carcinoma in situ in an inverted papilloma he is asking for other age-appropriate cancer screening to be completed. He is not due for a screening colonoscopy for several more years. We obtained a PSA today and it is pending at the time of this dictation. We also discussed lung cancer screening given his smoking history of greater than 30 pack years and quitting within the last 8 years. An order was placed for a low-dose CT scan to screen for lung cancer. We will follow-up on the results when this study has been completed. He is otherwise up-to-date on his health care maintenance and age-appropriate cancer screening.

## 2017-09-08 NOTE — Progress Notes (Signed)
Anticoagulation Management Alexander English is a 62 y.o. male who reports to the clinic for monitoring of warfarin treatment.    Indication: History of DVT, long term (current) use of anticoagulation.  Duration: indefinite Supervising physician: [name]  Anticoagulation Clinic Visit History: Patient does not report signs/symptoms of bleeding or thromboembolism  Other recent changes: No diet, medications, lifestyle endorsed other than as noted in patient findings.  Anticoagulation Episode Summary    Current INR goal:   2.0-3.0  TTR:   83.4 % (5.5 y)  Next INR check:   09/25/2017  INR from last check:   2.90 (09/08/2017)  Weekly max warfarin dose:     Target end date:   Indefinite  INR check location:   [facility]  Preferred lab:     Send INR reminders to:   ANTICOAG IMP   Indications   History of venous thromboembolism [Z86.718]       Comments:           No Known Allergies Prior to Admission medications   Medication Sig Start Date End Date Taking? Authorizing Provider  aspirin EC 81 MG tablet Take 81 mg by mouth daily.   Yes [provider]  atorvastatin (LIPITOR) 10 MG tablet Take 1 tablet (10 mg total) daily by mouth. 05/12/17  Yes [name], MD  benzoyl peroxide 5 % external liquid Apply topically 2 (two) times daily. 10/24/16  Yes [name], MD  mirabegron ER (MYRBETRIQ) 25 MG TB24 tablet Take 25 mg by mouth daily.   Yes [provider]  Multiple Vitamin (MULTIVITAMIN) tablet Take 1 tablet by mouth daily.   Yes [provider]  oxyCODONE-acetaminophen (PERCOCET/ROXICET) 5-325 MG tablet Take 1-2 tablets by mouth daily as needed for severe pain. 03/31/17  Yes [name], MD  sorbitol 70 % solution Take 15 mLs by mouth daily as needed. 03/31/17  Yes [name], MD  warfarin (COUMADIN) 5 MG tablet Take 1 & 1/2 tablets on Mondays and Thursdays; all other days, take one (1) tablet by mouth at 6PM. 06/12/17  Yes [name], RPH-CPP   Past Medical History:  Diagnosis Date  . Bilateral cataracts 05/18/2016   Not visually significant  . Chronic constipation 06/12/2013  . Chronic low back pain 06/12/2013   L4-5 facet arthropathy   . Chronic pain of both shoulders 12/07/2013   A/C joint osteoarthritis   . Cluster headaches    Resolved in 2006  . Dry eye syndrome, bilateral 05/18/2016  . Embolic stroke involving right middle cerebral artery ([place]) 08/08/2004   Occured after traveling from [place] to [place] where he was hospitalized for 1 month with no further work-up or therapy.  Returned to [place] unable to walk and was admitted to [facility] immediately after landing. Presumed embolic per neuro but TEE, bubble study, and hypercoag W/U negative. On indefinite coumadin per patient's informed preference.  Residual effects : Left spastic hemiplegia. Tx with phenol tibi  . Hyperplastic colon polyp 07/27/2012   Excised endoscopically 07/27/2012  . Hypertensive retinopathy of both eyes 05/18/2016  . Left nephrolithiasis 08/31/2015   With mild left hydronephrosis.  Scheduled to undergo shockwave lithotripsy.  . Obesity (BMI 30.0-34.9) 12/07/2013  . Osteoarthritis of both knees 01/26/2012  . Positive PPD 12/07/2013  . Pulmonary embolism ([place]) 09/30/2004   Diagnosed in April 2006 after returning from [place].  Likely provoked as it occurred after a 12 hour plane flight within 2 months of R MCA CVA with left hemiparesis. Patient has  made an informed decision to remain on lifelong warfarin.   . Urge incontinence of urine 08/31/2015   Possibly secondary to prior CVA.  Treated with Myrbetriq.   Social History   Socioeconomic History  . Marital status: Divorced    Spouse name: Not on file  . Number of children: Not on file  . Years of education: Not on file  . Highest education level: Not on file  Social Needs  . Financial resource strain: Not on file  . Food insecurity - worry: Not on file  . Food insecurity - inability: Not on file   . Transportation needs - medical: Not on file  . Transportation needs - non-medical: Not on file  Occupational History  . Not on file  Tobacco Use  . Smoking status: Former Smoker    Last attempt to quit: 06/28/2007    Years since quitting: 10.2  . Smokeless tobacco: Never Used  Substance and Sexual Activity  . Alcohol use: No    Alcohol/week: 0.0 oz    Comment: Remote past  . Drug use: No  . Sexual activity: Not on file  Other Topics Concern  . Not on file  Social History Narrative   Born in [place] but has been in the [place] for > 30 years.  Divorced, 1 daughter and 2 sons.  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   Family History  Problem Relation Age of Onset  . Stroke Maternal Grandmother   . Unexplained death Mother   . Depression Mother        After husband's death  . Unexplained death Father   . Osteoarthritis Father        Knees  . Early death Sister   . Seizures Sister   . Early death Brother 42       Unknown cause  . Healthy Daughter   . Healthy Son   . Stroke Maternal Aunt   . Healthy Sister   . Healthy Sister   . Unexplained death Brother   . Healthy Brother   . Healthy Son     ASSESSMENT Recent Results: The most recent result is correlated with 42.5 mg per week: Lab Results  Component Value Date   INR 2.90 09/08/2017   INR 1.9 08/21/2017   INR 2.45 08/15/2017    Anticoagulation Dosing: Description   Take 1 and 1/2 tablets of your 5mg  peach-colored warfarin by mouth once daily on Mondays and Wednesdays. All other days, take 1 tablet by mouth once daily.     INR today: Therapeutic  PLAN Weekly dose was decreased by 6% to 40 mg per week  Patient Instructions  Patient instructed to take medications as defined in the Anti-coagulation Track section of this encounter.  Patient instructed to take today's dose.  Patient instructed to take 1 and 1/2 tablets of your 5mg  peach-colored warfarin by mouth once  daily on Mondays and Wednesdays. All other days, take 1 tablet by mouth once daily Patient verbalized understanding of these instructions.     Patient advised to contact clinic or seek medical attention if signs/symptoms of bleeding or thromboembolism occur.  Patient verbalized understanding by repeating back information and was advised to contact me if further medication-related questions arise. Patient was also provided an information handout.  Follow-up Return in 2 weeks (on 09/25/2017) for Follow up INR at 1015h.  [name] Alexander English  15 minutes spent face-to-face with the patient during the encounter. 50%  of time spent on education. 50% of time was spent on fingerstick point of care INR sample collection, processing, results determination, dose adjustment and documentation in [facility].

## 2017-09-08 NOTE — Assessment & Plan Note (Signed)
Assessment  He states his constipation is well managed with the sorbitol 70%.  Plan  We will continue with the as needed sorbitol 70% for the constipation and reassess the efficacy of this therapy at the follow-up visit.

## 2017-09-08 NOTE — Assessment & Plan Note (Signed)
Assessment  He continues to have knee pain but has been very hesitant to use the oxycodone because of fear of getting addicted. When, on the occasions that he does use the oxycodone, he notes that it helps the pain.  Plan  He was reminded that the oxycodone is available should the pain become severe. If he were to take it infrequently, as he is now doing, I assured him that it was a very unlikely he would become addicted. We will reassess the efficacy of this therapy and his use of the oxycodone at the follow-up visit.

## 2017-09-08 NOTE — Assessment & Plan Note (Signed)
Assessment  He is tolerating the atorvastatin 10 mg by mouth daily without myalgias.  Plan  We will continue this moderate intensity statin and reassess for intolerances at the follow-up visit.

## 2017-09-08 NOTE — Patient Instructions (Signed)
It was great to see you again.  I am sorry about the tumor, but I am hopeful we caught this early.  1) Have the surgery so we know more about the tumor.  2) Stop the aspirin 10 days before the surgery and coumadin 5 days before the surgery.  3) I will look into the HPV vaccine to see if it is something that would help.  4) I will place an order for lung cancer screening.  5) We checked a PSA today for prostate cancer screening.  I will call you with the results next week.  6) Keep taking your other medications as you are.  I will see you back in 1 month, sooner if necessary.

## 2017-09-08 NOTE — Assessment & Plan Note (Signed)
Assessment  He states he's had an increase in his right shoulder pain recently with external rotation. The pain is along the trapezius muscle and is not lateral in the right shoulder. He has known A/C joint osteoarthritis of both shoulders. This is likely contributing to his pain. In addition, he may have some tendinitis or muscular strain as he uses the right arm to lift his body given his left hemiplegia.  Plan  In the future we may try something like Voltaren gel or return to another oral NSAID, being very careful given his chronic warfarin therapy. I advised him not to start a nonsteroidal anti-inflammatory at this time as I did not want to take any chances on delaying surgery. He agreed with this strategy and we will readdress the shoulder pain at the return visit after he has the sinus surgery.

## 2017-09-09 LAB — PSA: Prostate Specific Ag, Serum: 4.8 ng/mL — ABNORMAL HIGH (ref 0.0–4.0)

## 2017-09-11 ENCOUNTER — Ambulatory Visit: Payer: Medicare HMO

## 2017-09-11 ENCOUNTER — Telehealth: Payer: Self-pay | Admitting: Internal Medicine

## 2017-09-11 NOTE — Progress Notes (Signed)
Patient ID: Alexander English, male   DOB: 03-14-1956, 62 y.o.   MRN: [id number]  Mr. Enberg returned my call.  We discussed the mildly elevated PSA and the potential implications of the result and the options available including continued surveillance or referral to Urology.  As Mr. Fjelstad is relatively young, a more aggressive approach was his preference and he desired a Urology referral.  I agree with his reasoning and this decision and will place the referral at this time.

## 2017-09-11 NOTE — Progress Notes (Signed)
Patient ID: Alexander English, male   DOB: 16-Jul-1955, 62 y.o.   MRN: [id number]  PSA 4.8, up from 0.30 in 07/2008.  I tried calling Mr. Pokorski but received an unidentified voice mail box.  I left a message that I would call back later.  Given his concerns about cancer with his recent diagnosis I suspect he will want to pursue this slightly elevated PSA further with a Urology referral, but I will await his decision after we discuss the issue further.

## 2017-09-11 NOTE — Telephone Encounter (Signed)
Patient states he is returning your phone call from this morning.

## 2017-09-11 NOTE — Telephone Encounter (Signed)
Pt called back and would like for Dr. Eppie Gibson to speak with His Daughter so that he can get better understanding of what is going on with him helath wise.  Daughter's name is in the chart along with her phone number.

## 2017-09-11 NOTE — Progress Notes (Signed)
Indication: History of venous thromboembolism. Duration: Indefinite per patient preference. INR: At target. Agree with Dr. Groce's assessment and plan. 

## 2017-09-11 NOTE — Addendum Note (Signed)
Addended by: [name] D on: 09/11/2017 01:54 PM   Modules accepted: Orders

## 2017-09-13 NOTE — Telephone Encounter (Signed)
I was able to speak with his daughter.  She is getting the same questions from her father that I received and is providing him with a consistent message.  In particular, the issue of pain control.  She will speak with him further.  I was able to find an outside PSA of 1.47 from February 2017 that was obtained outside our system and scanned into his chart.  I feel very good about this recent normal number and suspect the PSA of 4.8 may represent a temporary elevation rather than prostate cancer.  He will be seeing the Urologist who can discuss risks and benefits further.

## 2017-09-13 NOTE — H&P (Addendum)
Alexander English is an 62 y.o. male.   Chief Complaint: Inverted papilloma HPI: History of left-sided nasal obstruction and nosebleeds.  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  Past Medical History:  Diagnosis Date  . Bilateral cataracts 05/18/2016   Not visually significant  . Chronic constipation 06/12/2013  . Chronic low back pain 06/12/2013   L4-5 facet arthropathy   . Chronic pain of both shoulders 12/07/2013   A/C joint osteoarthritis   . Cluster headaches    Resolved in 2006  . Dry eye syndrome, bilateral 05/18/2016  . Embolic stroke involving right middle cerebral artery ([place]) 08/08/2004   Occured after traveling from [place] to [place] where he was hospitalized for 1 month with no further work-up or therapy.  Returned to [place] unable to walk and was admitted to [facility] immediately after landing. Presumed embolic per neuro but TEE, bubble study, and hypercoag W/U negative. On indefinite coumadin per patient's informed preference.  Residual effects : Left spastic hemiplegia. Tx with phenol tibi  . Hyperplastic colon polyp 07/27/2012   Excised endoscopically 07/27/2012  . Hypertensive retinopathy of both eyes 05/18/2016  . Left nephrolithiasis 08/31/2015   With mild left hydronephrosis.  Scheduled to undergo shockwave lithotripsy.  . Obesity (BMI 30.0-34.9) 12/07/2013  . Osteoarthritis of both knees 01/26/2012  . Positive PPD 12/07/2013  . Pulmonary embolism ([place]) 09/30/2004   Diagnosed in April 2006 after returning from [place].  Likely provoked as it occurred after a 12 hour plane flight within 2 months of R MCA CVA with left hemiparesis. Patient has made an informed decision to remain on lifelong warfarin.   . Urge incontinence of urine 08/31/2015   Possibly secondary to prior CVA.  Treated with Myrbetriq.    Past Surgical History:  Procedure Laterality Date  . FOOT SURGERY Bilateral    Bunion  . I&D EXTREMITY Left 11/28/2001   Left thumb thenar space abscess.     Family History  Problem Relation Age of Onset  . Stroke Maternal Grandmother   . Unexplained death Mother   . Depression Mother        After husband's death  . Unexplained death Father   . Osteoarthritis Father        Knees  . Early death Sister   . Seizures Sister   . Early death Brother 13       Unknown cause  . Healthy Daughter   . Healthy Son   . Stroke Maternal Aunt   . Healthy Sister   . Healthy Sister   . Unexplained death Brother   . Healthy Brother   . Healthy Son    Social History:  reports that he quit smoking about 10 years ago. he has never used smokeless tobacco. He reports that he does not drink alcohol or use drugs.  Allergies: No Known Allergies  No medications prior to admission.    No results found for this or any previous visit (from the past 48 hour(s)). No results found.  ROS: otherwise negative  There were no vitals taken for this visit.  PHYSICAL EXAM: Overall appearance:  Healthy appearing, in no distress Head:  Normocephalic, atraumatic. Ears: External auditory canals are clear; tympanic membranes are intact and the middle ears are free of any effusion. Nose: External nose is healthy in appearance. Internal nasal exam reveals a mass completely filling the left nasal cavity. Oral Cavity/pharynx:  There are no mucosal lesions or masses identified. Hypopharynx/Larynx: no signs of any mucosal lesions or masses  identified. Vocal cords move normally. Neuro:  No identifiable neurologic deficits. Neck: No palpable neck masses.  Studies Reviewed: Maxillofacial CT    Assessment/Plan Inverting papilloma with in situ carcinoma.  Recommend surgical resection endoscopic, including maxillary and ethmoid surgery, possible turbinectomy, possible medial maxillectomy.  [name] 09/13/2017, 10:32 AM

## 2017-09-13 NOTE — Telephone Encounter (Signed)
I called his daughter and received an identified voice mail.  I left a message that I would call back later.

## 2017-09-21 DIAGNOSIS — N3941 Urge incontinence: Secondary | ICD-10-CM | POA: Diagnosis not present

## 2017-09-21 DIAGNOSIS — R972 Elevated prostate specific antigen [PSA]: Secondary | ICD-10-CM | POA: Diagnosis not present

## 2017-09-25 ENCOUNTER — Ambulatory Visit (INDEPENDENT_AMBULATORY_CARE_PROVIDER_SITE_OTHER): Payer: Medicare HMO | Admitting: Pharmacy Technician

## 2017-09-25 DIAGNOSIS — Z7901 Long term (current) use of anticoagulants: Secondary | ICD-10-CM | POA: Diagnosis not present

## 2017-09-25 DIAGNOSIS — Z86718 Personal history of other venous thrombosis and embolism: Secondary | ICD-10-CM

## 2017-09-25 DIAGNOSIS — Z5181 Encounter for therapeutic drug level monitoring: Secondary | ICD-10-CM | POA: Diagnosis not present

## 2017-09-25 LAB — POCT INR: INR: 2.7

## 2017-09-25 NOTE — Progress Notes (Signed)
Anticoagulation Management Alexander English is a 62 y.o. male who reports to the clinic for monitoring of warfarin treatment.    Indication: History of VTE  Duration: indefinite Supervising physician: Joni Reining  Anticoagulation Clinic Visit History: Patient does not report signs/symptoms of bleeding or thromboembolism. Other recent changes: Pt is scheduled for surgery on 10/06/2017 and has plans to hold his warfarin starting 10/01/2017, and resume after surgery on 10/07/2017.  He will be seen in clinic on 4/10 to confirm INR appropriate for surgery.   Anticoagulation Episode Summary    Current INR goal:   2.0-3.0  TTR:   83.5 % (5.5 y)  Next INR check:   10/04/2017  INR from last check:   2.7 (09/25/2017)  Weekly max warfarin dose:     Target end date:   Indefinite  INR check location:   Anticoagulation Clinic  Preferred lab:     Send INR reminders to:   ANT[facility]   Indications   History of venous thromboembolism [Z86.718]       Comments:           No Known Allergies Prior to Admission medications   Medication Sig Start Date End Date Taking? Authorizing Provider  aspirin EC 81 MG tablet Take 81 mg by mouth daily.   Yes [provider]  atorvastatin (LIPITOR) 10 MG tablet Take 1 tablet (10 mg total) daily by mouth. 05/12/17  Yes [name], MD  benzoyl peroxide 5 % external liquid Apply topically 2 (two) times daily. 10/24/16  Yes [name], MD  fesoterodine (TOVIAZ) 8 MG TB24 tablet Take 8 mg by mouth daily.   Yes [provider]  mirabegron ER (MYRBETRIQ) 25 MG TB24 tablet Take 25 mg by mouth daily.    Yes [provider]  Multiple Vitamin (MULTIVITAMIN) tablet Take 1 tablet by mouth daily.   Yes [provider]  oxyCODONE-acetaminophen (PERCOCET/ROXICET) 5-325 MG tablet Take 1-2 tablets by mouth daily as needed for severe pain. 03/31/17  Yes [name], MD  sorbitol 70 % solution Take 15 mLs by mouth daily as  needed. Patient taking differently: Take 15 mLs by mouth daily as needed (constipation).  03/31/17  Yes [name], MD  warfarin (COUMADIN) 5 MG tablet Take 1 & 1/2 tablets on Mondays and Thursdays; all other days, take one (1) tablet by mouth at [facility]. Patient taking differently: Take 1 & 1/2 tablets on Mondays and Wednesday ; all other days, take one (1) tablet by mouth at 6PM. 06/12/17  Yes [name], RPH-CPP   Past Medical History:  Diagnosis Date  . Bilateral cataracts 05/18/2016   Not visually significant  . Chronic constipation 06/12/2013  . Chronic low back pain 06/12/2013   L4-5 facet arthropathy   . Chronic pain of both shoulders 12/07/2013   A/C joint osteoarthritis   . Cluster headaches    Resolved in 2006  . Dry eye syndrome, bilateral 05/18/2016  . Embolic stroke involving right middle cerebral artery ([place]) 08/08/2004   Occured after traveling from [place] to [place] where he was hospitalized for 1 month with no further work-up or therapy.  Returned to [place] unable to walk and was admitted to [facility] immediately after landing. Presumed embolic per neuro but TEE, bubble study, and hypercoag W/U negative. On indefinite coumadin per patient's informed preference.  Residual effects : Left spastic hemiplegia. Tx with phenol tibi  . Hyperplastic colon polyp 07/27/2012   Excised endoscopically 07/27/2012  . Hypertensive retinopathy of both eyes  05/18/2016  . Left nephrolithiasis 08/31/2015   With mild left hydronephrosis.  Scheduled to undergo shockwave lithotripsy.  . Obesity (BMI 30.0-34.9) 12/07/2013  . Osteoarthritis of both knees 01/26/2012  . Positive PPD 12/07/2013  . Pulmonary embolism ([place]) 09/30/2004   Diagnosed in April 2006 after returning from [place].  Likely provoked as it occurred after a 12 hour plane flight within 2 months of R MCA CVA with left hemiparesis. Patient has made an informed decision to remain on lifelong warfarin.   . Urge incontinence of urine 08/31/2015   Possibly  secondary to prior CVA.  Treated with Myrbetriq.   Social History   Socioeconomic History  . Marital status: Divorced    Spouse name: Not on file  . Number of children: Not on file  . Years of education: Not on file  . Highest education level: Not on file  Occupational History  . Not on file  Social Needs  . Financial resource strain: Not on file  . Food insecurity:    Worry: Not on file    Inability: Not on file  . Transportation needs:    Medical: Not on file    Non-medical: Not on file  Tobacco Use  . Smoking status: Former Smoker    Last attempt to quit: 06/28/2007    Years since quitting: 10.2  . Smokeless tobacco: Never Used  Substance and Sexual Activity  . Alcohol use: No    Alcohol/week: 0.0 oz    Comment: Remote past  . Drug use: No  . Sexual activity: Not on file  Lifestyle  . Physical activity:    Days per week: Not on file    Minutes per session: Not on file  . Stress: Not on file  Relationships  . Social connections:    Talks on phone: Not on file    Gets together: Not on file    Attends religious service: Not on file    Active member of club or organization: Not on file    Attends meetings of clubs or organizations: Not on file    Relationship status: Not on file  Other Topics Concern  . Not on file  Social History Narrative   Born in [place] but has been in the [place] for > 30 years.  Divorced, 1 daughter and 2 sons.  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   Family History  Problem Relation Age of Onset  . Stroke Maternal Grandmother   . Unexplained death Mother   . Depression Mother        After husband's death  . Unexplained death Father   . Osteoarthritis Father        Knees  . Early death Sister   . Seizures Sister   . Early death Brother 62       Unknown cause  . Healthy Daughter   . Healthy Son   . Stroke Maternal Aunt   . Healthy Sister   . Healthy Sister   . Unexplained death Brother    . Healthy Brother   . Healthy Son     ASSESSMENT Recent Results: The most recent result is correlated with 40 mg per week: Lab Results  Component Value Date   INR 2.7 09/25/2017   INR 2.90 09/08/2017   INR 1.9 08/21/2017    Anticoagulation Dosing: Description   Take 1 and 1/2 tablets of your 5mg  peach-colored warfarin by mouth once daily on Mondays and Wednesdays.  All other days, take 1 tablet by mouth once daily.     INR today: Therapeutic  PLAN Weekly dose was unchanged   Patient Instructions  Patient instructed to take medications as defined in the Anti-coagulation Track section of this encounter.  Patient instructed to take today's dose.  Patient instructed to take 1 and 1/2 tablets of your 5mg  peach-colored warfarin by mouth once daily on Mondays and Wednesdays. All other days, take 1 tablet by mouth once daily. Patient verbalized understanding of these instructions.    Patient advised to contact clinic or seek medical attention if signs/symptoms of bleeding or thromboembolism occur.  Patient verbalized understanding by repeating back information and was advised to contact me if further medication-related questions arise. Patient was also provided an information handout.  Follow-up Return in about 9 days (around 10/04/2017) for INR follow up 0900.  Bertis Ruddy, PharmD Pharmacy Resident Pager #: [phone] 09/25/2017 10:44 AM  15 minutes spent face-to-face with the patient during the encounter. 50% of time spent on education. 50% of time was spent on INR point of care testing, analysis, and documentation in EMR.

## 2017-09-25 NOTE — Patient Instructions (Signed)
Patient instructed to take medications as defined in the Anti-coagulation Track section of this encounter.  Patient instructed to take today's dose.  Patient instructed to take 1 and 1/2 tablets of your 5mg  peach-colored warfarin by mouth once daily on Mondays and Wednesdays. All other days, take 1 tablet by mouth once daily. Patient verbalized understanding of these instructions.

## 2017-09-27 NOTE — Pre-Procedure Instructions (Signed)
Alexander English  09/27/2017      [place] ([place]), [place] [facility] [place] [address] [place] ([place]) [place] [address] Phone: [phone] Fax: [phone]    Your procedure is scheduled on Friday April 12.  Report to [facility] Admitting at 7:30 A.M.  Call this number if you have problems the morning of surgery:  [phone]   Remember:  Do not eat food or drink liquids after midnight.  Take these medicines the morning of surgery with A SIP OF WATER:   Fesoterodine (Toviaz) Mirabegron (Myrbetriq) Oxycodone-acetaminophen (Percocet) if needed  7 days prior to surgery STOP taking any Aleve, Naproxen, Ibuprofen, Motrin, Advil, Goody's, BC's, all herbal medications, fish oil, and all vitamins  **Follow your surgeon's instructions on stopping Aspirin and warfarin (Coumadin). If no instructions were given, please call your surgeon's office**    Do not wear jewelry, make-up or nail polish.  Do not wear lotions, powders, or perfumes, or deodorant.  Do not shave 48 hours prior to surgery.  Men may shave face and neck.  Do not bring valuables to the hospital.  [facility] is not responsible for any belongings or valuables.  Contacts, dentures or bridgework may not be worn into surgery.  Leave your suitcase in the car.  After surgery it may be brought to your room.  For patients admitted to the hospital, discharge time will be determined by your treatment team.  Patients discharged the day of surgery will not be allowed to drive home.   Special instructions:    [place]- Preparing For Surgery  Before surgery, you can play an important role. Because skin is not sterile, your skin needs to be as free of germs as possible. You can reduce the number of germs on your skin by washing with CHG (chlorahexidine gluconate) Soap before surgery.  CHG is an antiseptic cleaner which kills germs and bonds with the skin to continue killing germs  even after washing.  Please do not use if you have an allergy to CHG or antibacterial soaps. If your skin becomes reddened/irritated stop using the CHG.  Do not shave (including legs and underarms) for at least 48 hours prior to first CHG shower. It is OK to shave your face.  Please follow these instructions carefully.   1. Shower the NIGHT BEFORE SURGERY and the MORNING OF SURGERY with CHG.   2. If you chose to wash your hair, wash your hair first as usual with your normal shampoo.  3. After you shampoo, rinse your hair and body thoroughly to remove the shampoo.  4. Use CHG as you would any other liquid soap. You can apply CHG directly to the skin and wash gently with a scrungie or a clean washcloth.   5. Apply the CHG Soap to your body ONLY FROM THE NECK DOWN.  Do not use on open wounds or open sores. Avoid contact with your eyes, ears, mouth and genitals (private parts). Wash Face and genitals (private parts)  with your normal soap.  6. Wash thoroughly, paying special attention to the area where your surgery will be performed.  7. Thoroughly rinse your body with warm water from the neck down.  8. DO NOT shower/wash with your normal soap after using and rinsing off the CHG Soap.  9. Pat yourself dry with a CLEAN TOWEL.  10. Wear CLEAN PAJAMAS to bed the night before surgery, wear comfortable clothes the morning of surgery  11. Place CLEAN SHEETS on your bed the night of your first shower and DO NOT SLEEP WITH PETS.    Day of Surgery: Do not apply any deodorants/lotions. Please wear clean clothes to the hospital/surgery center.      Please read over the following fact sheets that you were given. Coughing and Deep Breathing and Surgical Site Infection Prevention

## 2017-09-28 ENCOUNTER — Other Ambulatory Visit: Payer: Self-pay

## 2017-09-28 ENCOUNTER — Encounter (HOSPITAL_COMMUNITY)
Admission: RE | Admit: 2017-09-28 | Discharge: 2017-09-28 | Disposition: A | Discharge status: Home / Self Care | Payer: Medicare HMO | Source: Ambulatory Visit | Attending: Otolaryngology | Admitting: Otolaryngology

## 2017-09-28 ENCOUNTER — Encounter (HOSPITAL_COMMUNITY): Payer: Self-pay

## 2017-09-28 DIAGNOSIS — Z01812 Encounter for preprocedural laboratory examination: Secondary | ICD-10-CM | POA: Diagnosis not present

## 2017-09-28 HISTORY — DX: Personal history of urinary calculi: Z87.442

## 2017-09-28 LAB — CBC
HCT: 40.9 % (ref 39.0–52.0)
Hemoglobin: 13 g/dL (ref 13.0–17.0)
MCH: 25.4 pg — ABNORMAL LOW (ref 26.0–34.0)
MCHC: 31.8 g/dL (ref 30.0–36.0)
MCV: 79.9 fL (ref 78.0–100.0)
Platelets: 242 10*3/uL (ref 150–400)
RBC: 5.12 MIL/uL (ref 4.22–5.81)
RDW: 15.4 % (ref 11.5–15.5)
WBC: 5.7 10*3/uL (ref 4.0–10.5)

## 2017-09-28 LAB — BASIC METABOLIC PANEL
Anion gap: 10 (ref 5–15)
BUN: 12 mg/dL (ref 6–20)
CO2: 22 mmol/L (ref 22–32)
Calcium: 9.2 mg/dL (ref 8.9–10.3)
Chloride: 108 mmol/L (ref 101–111)
Creatinine, Ser: 0.9 mg/dL (ref 0.61–1.24)
GFR calc Af Amer: >60 mL/min (ref >60)
GFR calc non Af Amer: >60 mL/min (ref >60)
Glucose, Bld: 83 mg/dL (ref 65–99)
Potassium: 4 mmol/L (ref 3.5–5.1)
Sodium: 140 mmol/L (ref 135–145)

## 2017-09-28 LAB — PROTIME-INR
INR: 2.25
Prothrombin Time: 24.7 seconds — ABNORMAL HIGH (ref 11.4–15.2)

## 2017-09-28 NOTE — Progress Notes (Signed)
INTERNAL MEDICINE TEACHING ATTENDING ADDENDUM - Alexander Groves, DO Duration- indefinite (patient preference), Indication- PE, Hx CVA, INR- theraputic Lab Results  Component Value Date   INR 2.25 09/28/2017  . Agree with pharmacy recommendations as outlined in their note.

## 2017-09-28 NOTE — Pre-Procedure Instructions (Signed)
Alexander English  09/28/2017      [place] ([place]), [place] - [place] [address] [place] ([place]) [place] [address] Phone: [phone] Fax: [phone]    Your procedure is scheduled on Friday April 12.   Report to [facility] Admitting at 7:30 A.M.             (posted surgery time 9:30a - 11:46a)   Call this number if you have problems the morning of surgery:  [phone]   Remember:   Do not eat food or drink liquids after midnight, Thursday.   Take these medicines the morning of surgery with A SIP OF WATER:   Fesoterodine (Toviaz) Mirabegron (Myrbetriq) Oxycodone-acetaminophen (Percocet) if needed  7 days prior to surgery STOP taking any Aleve, Naproxen, Ibuprofen, Motrin, Advil, Goody's, BC's, all herbal medications, fish oil, and all vitamins  **Follow your surgeon's instructions on stopping Aspirin and warfarin (Coumadin). If no instructions were given, please call your surgeon's office** Your last dose _________________________________    Do not wear jewelry - no rings or watches.  Do not wear lotions, colognesor deodorant.             Men may shave face and neck.  Do not bring valuables to the hospital.  [facility] is not responsible for any belongings or valuables.  Contacts, dentures or bridgework may not be worn into surgery.  Leave your suitcase in the car.  After surgery it may be brought to your room.  For patients admitted to the hospital, discharge time will be determined by your treatment team.    [facility]- Preparing For Surgery  Before surgery, you can play an important role. Because skin is not sterile, your skin needs to be as free of germs as possible. You can reduce the number of germs on your skin by washing with CHG (chlorahexidine gluconate) Soap before surgery.  CHG is an antiseptic cleaner which kills germs and bonds with the skin to continue killing germs even after washing.  Please do  not use if you have an allergy to CHG or antibacterial soaps. If your skin becomes reddened/irritated stop using the CHG.  Do not shave (including legs and underarms) for at least 48 hours prior to first CHG shower. It is OK to shave your face.  Please follow these instructions carefully.   1. Shower the NIGHT BEFORE SURGERY and the MORNING OF SURGERY with CHG.   2. If you chose to wash your hair, wash your hair first as usual with your normal shampoo.  3. After you shampoo, rinse your hair and body thoroughly to remove the shampoo.  4. Use CHG as you would any other liquid soap. You can apply CHG directly to the skin and wash gently with a scrungie or a clean washcloth.   5. Apply the CHG Soap to your body ONLY FROM THE NECK DOWN.  Do not use on open wounds or open sores. Avoid contact with your eyes, ears, mouth and genitals (private parts). Wash Face and genitals (private parts)  with your normal soap.  6. Wash thoroughly, paying special attention to the area where your surgery will be performed.  7. Thoroughly rinse your body with warm water from the neck down.  8. DO NOT shower/wash with your normal soap after using and rinsing off the CHG Soap.  9. Pat yourself dry with a CLEAN TOWEL.  10. Wear CLEAN PAJAMAS to bed the night before  surgery, wear comfortable clothes the morning of surgery  11. Place CLEAN SHEETS on your bed the night of your first shower and DO NOT SLEEP WITH PETS.    Day of Surgery: Do not apply any deodorants/lotions. Please wear clean clothes to the hospital/surgery center.      Please read over the following fact sheets that you were given. Coughing and Deep Breathing and Surgical Site Infection Prevention

## 2017-09-28 NOTE — Progress Notes (Addendum)
PCP: [name], MD  Cardiologist: pt denies  EKG: pt denies past year, obtained  today  Stress test: pt denies ever  ECHO: pt denies ever  Cardiac Cath: pt denies ever  Chest x-ray: pt denies past year, no recent respiratory infections/complication  Pt has been told to stop his coumadin 10/01/17 and stop aspirin 81 mg today by his dr.

## 2017-10-04 ENCOUNTER — Ambulatory Visit (INDEPENDENT_AMBULATORY_CARE_PROVIDER_SITE_OTHER): Payer: Medicare HMO | Admitting: Pharmacist

## 2017-10-04 DIAGNOSIS — Z86718 Personal history of other venous thrombosis and embolism: Secondary | ICD-10-CM

## 2017-10-04 DIAGNOSIS — Z7901 Long term (current) use of anticoagulants: Secondary | ICD-10-CM

## 2017-10-04 DIAGNOSIS — Z5181 Encounter for therapeutic drug level monitoring: Secondary | ICD-10-CM | POA: Diagnosis not present

## 2017-10-04 LAB — POCT INR: INR: 1.3

## 2017-10-04 NOTE — Patient Instructions (Signed)
Patient instructed to take medications as defined in the Anti-coagulation Track section of this encounter.  Patient instructed to hold today's dose.  Patient verbalized understanding of these instructions.  Continue to hold you coumadin for the upcoming procedure and resume post-op on 4/13. When you restart, take 1 and 1/2 tablets of your 5mg  peach-colored warfarin by mouth once daily on Mondays and Wednesdays. All other days, take 1 tablet by mouth once daily.

## 2017-10-04 NOTE — Progress Notes (Signed)
Indication: History of venous thromboembolism. Duration: Indefinite per patient preference. INR: Below target (but appropriate as he is holding his warfarin for a procedure). Agree with Dr. Linton Rump' assessment and plan.

## 2017-10-04 NOTE — Progress Notes (Signed)
Anticoagulation Management Alexander English is a 62 y.o. male who reports to the clinic for monitoring of warfarin treatment. He has been holding his warfarin appropriately since Sunday 10/01/17 for a procedure on 10/06/17. He is to resume his warfarin the following day (4/13) and continue the dosing he was prescribed prior to holding the medication.  Indication: History of VTE Duration: indefinite Supervising physician: [name], MD  Anticoagulation Clinic Visit History: Patient does not report signs/symptoms of bleeding or thromboembolism. Other recent changes: denies any diet, medications, or lifestyle changes  Anticoagulation Episode Summary    Current INR goal:   2.0-3.0  TTR:   83.4 % (5.6 y)  Next INR check:   10/16/2017  INR from last check:   1.3! (10/04/2017)  Weekly max warfarin dose:     Target end date:   Indefinite  INR check location:   Anticoagulation Clinic  Preferred lab:     Send INR reminders to:   [facility]   Indications   History of venous thromboembolism [Z86.718]       Comments:          ASSESSMENT Recent Results: The most recent result is correlated with holding his warfarin since Sunday 10/01/17. Lab Results  Component Value Date   INR 1.3 10/04/2017   INR 2.25 09/28/2017   INR 2.7 09/25/2017   Anticoagulation Dosing: Description   Continue to hold you coumadin for the upcoming procedure and resume post-op on 4/13. When you restart, take 1 and 1/2 tablets of your 5mg  peach-colored warfarin by mouth once daily on Mondays and Wednesdays. All other days, take 1 tablet by mouth once daily.     INR today: Subtherapeutic, which is appropriate for his upcoming procedure.  PLAN Weekly dose was unchanged   Patient Instructions  Patient instructed to take medications as defined in the Anti-coagulation Track section of this encounter.  Patient instructed to hold today's dose.  Patient verbalized understanding of these instructions.  Continue to  hold you coumadin for the upcoming procedure and resume post-op on 4/13. When you restart, take 1 and 1/2 tablets of your 5mg  peach-colored warfarin by mouth once daily on Mondays and Wednesdays. All other days, take 1 tablet by mouth once daily.  Patient advised to contact clinic or seek medical attention if signs/symptoms of bleeding or thromboembolism occur.  Patient verbalized understanding by repeating back information and was advised to contact me if further medication-related questions arise. Patient was also provided an information handout.  Follow-up Return in about 12 days (around 10/16/2017) for INR f/u post-op @1030  on 4/22.   Patterson Hammersmith PharmD PGY1 Pharmacy Practice Resident 10/04/2017 9:32 AM Pager: [phone]  15 minutes spent face-to-face with the patient during the encounter. 50% of time spent on education. 50% of time was spent on collection of INR and documentation.

## 2017-10-06 ENCOUNTER — Ambulatory Visit (HOSPITAL_COMMUNITY): Payer: Medicare HMO | Admitting: Anesthesiology

## 2017-10-06 ENCOUNTER — Encounter (HOSPITAL_COMMUNITY): Payer: Self-pay | Admitting: Certified Registered Nurse Anesthetist

## 2017-10-06 ENCOUNTER — Encounter (HOSPITAL_COMMUNITY)
Admission: RE | Disposition: A | Discharge status: Home / Self Care | Payer: Self-pay | Source: Ambulatory Visit | Attending: Otolaryngology

## 2017-10-06 ENCOUNTER — Ambulatory Visit (HOSPITAL_COMMUNITY)
Admission: RE | Admit: 2017-10-06 | Discharge: 2017-10-06 | Disposition: A | Discharge status: Home / Self Care | Payer: Medicare HMO | Source: Ambulatory Visit | Attending: Otolaryngology | Admitting: Otolaryngology

## 2017-10-06 DIAGNOSIS — J329 Chronic sinusitis, unspecified: Secondary | ICD-10-CM | POA: Insufficient documentation

## 2017-10-06 DIAGNOSIS — Z86711 Personal history of pulmonary embolism: Secondary | ICD-10-CM | POA: Insufficient documentation

## 2017-10-06 DIAGNOSIS — Z87891 Personal history of nicotine dependence: Secondary | ICD-10-CM | POA: Diagnosis not present

## 2017-10-06 DIAGNOSIS — E785 Hyperlipidemia, unspecified: Secondary | ICD-10-CM | POA: Diagnosis not present

## 2017-10-06 DIAGNOSIS — J3489 Other specified disorders of nose and nasal sinuses: Secondary | ICD-10-CM | POA: Diagnosis not present

## 2017-10-06 DIAGNOSIS — C319 Malignant neoplasm of accessory sinus, unspecified: Secondary | ICD-10-CM | POA: Insufficient documentation

## 2017-10-06 DIAGNOSIS — D367 Benign neoplasm of other specified sites: Secondary | ICD-10-CM | POA: Diagnosis not present

## 2017-10-06 DIAGNOSIS — D140 Benign neoplasm of middle ear, nasal cavity and accessory sinuses: Secondary | ICD-10-CM | POA: Diagnosis not present

## 2017-10-06 DIAGNOSIS — Z8673 Personal history of transient ischemic attack (TIA), and cerebral infarction without residual deficits: Secondary | ICD-10-CM | POA: Diagnosis not present

## 2017-10-06 DIAGNOSIS — I739 Peripheral vascular disease, unspecified: Secondary | ICD-10-CM | POA: Diagnosis not present

## 2017-10-06 DIAGNOSIS — J320 Chronic maxillary sinusitis: Secondary | ICD-10-CM | POA: Diagnosis not present

## 2017-10-06 HISTORY — PX: EXCISION NASAL MASS: SHX6271

## 2017-10-06 HISTORY — PX: NASAL SINUS SURGERY: SHX719

## 2017-10-06 SURGERY — SINUS SURGERY, ENDOSCOPIC
Anesthesia: General | Site: Nose | Laterality: Left

## 2017-10-06 MED ORDER — PROMETHAZINE HCL 25 MG RE SUPP: 25.0000 mg | Freq: Four times a day (QID) | RECTAL | 1 refills | Status: DC | PRN

## 2017-10-06 MED ORDER — REMIFENTANIL HCL 1 MG IV SOLR
INTRAVENOUS | Status: DC | PRN
  Administered 2017-10-06: 22 ug via INTRAVENOUS

## 2017-10-06 MED ORDER — ONDANSETRON HCL 4 MG/2ML IJ SOLN
INTRAMUSCULAR | Status: AC
  Filled 2017-10-06: qty 2

## 2017-10-06 MED ORDER — DOUBLE ANTIBIOTIC 500-10000 UNIT/GM EX OINT
TOPICAL_OINTMENT | CUTANEOUS | Status: AC
  Filled 2017-10-06: qty 1

## 2017-10-06 MED ORDER — FENTANYL CITRATE (PF) 250 MCG/5ML IJ SOLN
INTRAMUSCULAR | Status: AC
  Filled 2017-10-06: qty 5

## 2017-10-06 MED ORDER — LIDOCAINE-EPINEPHRINE 1 %-1:100000 IJ SOLN
INTRAMUSCULAR | Status: DC | PRN
  Administered 2017-10-06: 20 mL

## 2017-10-06 MED ORDER — SODIUM CHLORIDE 0.9 % IR SOLN
Status: DC | PRN
  Administered 2017-10-06: 1000 mL

## 2017-10-06 MED ORDER — LIDOCAINE 2% (20 MG/ML) 5 ML SYRINGE
INTRAMUSCULAR | Status: DC | PRN
  Administered 2017-10-06: 100 mg via INTRAVENOUS

## 2017-10-06 MED ORDER — HYDROCODONE-ACETAMINOPHEN 7.5-325 MG PO TABS: 1.0000 | ORAL_TABLET | Freq: Four times a day (QID) | ORAL | 0 refills | Status: DC | PRN

## 2017-10-06 MED ORDER — ARTIFICIAL TEARS OPHTHALMIC OINT
TOPICAL_OINTMENT | OPHTHALMIC | Status: AC
  Filled 2017-10-06: qty 3.5

## 2017-10-06 MED ORDER — PHENYLEPHRINE HCL 10 MG/ML IJ SOLN
INTRAVENOUS | Status: DC | PRN
  Administered 2017-10-06: 50 ug/min via INTRAVENOUS

## 2017-10-06 MED ORDER — LACTATED RINGERS IV SOLN
INTRAVENOUS | Status: DC
  Administered 2017-10-06: 11:00:00 via INTRAVENOUS
  Administered 2017-10-06: 50 mL/h via INTRAVENOUS

## 2017-10-06 MED ORDER — PROMETHAZINE HCL 25 MG/ML IJ SOLN: 6.2500 mg | INTRAMUSCULAR | Status: DC | PRN

## 2017-10-06 MED ORDER — MIDAZOLAM HCL 2 MG/2ML IJ SOLN
INTRAMUSCULAR | Status: AC
  Filled 2017-10-06: qty 2

## 2017-10-06 MED ORDER — MEPERIDINE HCL 50 MG/ML IJ SOLN: 6.2500 mg | INTRAMUSCULAR | Status: DC | PRN

## 2017-10-06 MED ORDER — LIDOCAINE-EPINEPHRINE 1 %-1:100000 IJ SOLN
INTRAMUSCULAR | Status: AC
  Filled 2017-10-06: qty 1

## 2017-10-06 MED ORDER — PHENYLEPHRINE HCL 10 MG/ML IJ SOLN
INTRAMUSCULAR | Status: DC | PRN
  Administered 2017-10-06 (×2): 160 ug via INTRAVENOUS

## 2017-10-06 MED ORDER — SODIUM CHLORIDE 0.9 % IV SOLN
0.0125 ug/kg/min | INTRAVENOUS | Status: AC
  Administered 2017-10-06: .1 ug/kg/min via INTRAVENOUS
  Filled 2017-10-06: qty 2000

## 2017-10-06 MED ORDER — ROCURONIUM BROMIDE 10 MG/ML (PF) SYRINGE
PREFILLED_SYRINGE | INTRAVENOUS | Status: AC
  Filled 2017-10-06: qty 5

## 2017-10-06 MED ORDER — STERILE WATER FOR IRRIGATION IR SOLN
Status: DC | PRN
  Administered 2017-10-06: 200 mL

## 2017-10-06 MED ORDER — ONDANSETRON HCL 4 MG/2ML IJ SOLN
INTRAMUSCULAR | Status: DC | PRN
  Administered 2017-10-06: 4 mg via INTRAVENOUS

## 2017-10-06 MED ORDER — HYDROMORPHONE HCL 1 MG/ML IJ SOLN: 0.2500 mg | INTRAMUSCULAR | Status: DC | PRN

## 2017-10-06 MED ORDER — MIDAZOLAM HCL 2 MG/2ML IJ SOLN
INTRAMUSCULAR | Status: DC | PRN
  Administered 2017-10-06: 2 mg via INTRAVENOUS

## 2017-10-06 MED ORDER — EPHEDRINE SULFATE 50 MG/ML IJ SOLN
INTRAMUSCULAR | Status: DC | PRN
  Administered 2017-10-06: 10 mg via INTRAVENOUS

## 2017-10-06 MED ORDER — OXYMETAZOLINE HCL 0.05 % NA SOLN
NASAL | Status: AC
  Filled 2017-10-06: qty 15

## 2017-10-06 MED ORDER — PROPOFOL 10 MG/ML IV BOLUS
INTRAVENOUS | Status: DC | PRN
  Administered 2017-10-06: 150 mg via INTRAVENOUS
  Administered 2017-10-06: 50 mg via INTRAVENOUS

## 2017-10-06 MED ORDER — LIDOCAINE 2% (20 MG/ML) 5 ML SYRINGE
INTRAMUSCULAR | Status: AC
  Filled 2017-10-06: qty 5

## 2017-10-06 MED ORDER — PROPOFOL 10 MG/ML IV BOLUS
INTRAVENOUS | Status: AC
  Filled 2017-10-06: qty 20

## 2017-10-06 MED ORDER — ESMOLOL HCL 100 MG/10ML IV SOLN
INTRAVENOUS | Status: AC
  Filled 2017-10-06: qty 10

## 2017-10-06 MED ORDER — DEXAMETHASONE SODIUM PHOSPHATE 10 MG/ML IJ SOLN
INTRAMUSCULAR | Status: DC | PRN
  Administered 2017-10-06: 10 mg via INTRAVENOUS

## 2017-10-06 MED ORDER — DEXAMETHASONE SODIUM PHOSPHATE 10 MG/ML IJ SOLN
INTRAMUSCULAR | Status: AC
  Filled 2017-10-06: qty 1

## 2017-10-06 MED ORDER — OXYMETAZOLINE HCL 0.05 % NA SOLN
NASAL | Status: DC | PRN
  Administered 2017-10-06: 1

## 2017-10-06 MED ORDER — 0.9 % SODIUM CHLORIDE (POUR BTL) OPTIME
TOPICAL | Status: DC | PRN
  Administered 2017-10-06: 1000 mL

## 2017-10-06 SURGICAL SUPPLY — 48 items
ATTRACTOMAT 16X20 MAGNETIC DRP (DRAPES) IMPLANT
BLADE RAD40 ROTATE 4M 4 5PK (BLADE) IMPLANT
BLADE RAD60 ROTATE M4 4 5PK (BLADE) IMPLANT
BLADE SURG 15 STRL LF DISP TIS (BLADE) IMPLANT
BLADE SURG 15 STRL SS (BLADE)
BLADE TRICUT ROTATE M4 4 5PK (BLADE) ×2 IMPLANT
CANISTER SUCT 3000ML PPV (MISCELLANEOUS) ×3 IMPLANT
COAGULATOR SUCT SWTCH 10FR 6 (ELECTROSURGICAL) IMPLANT
CONT SPEC 4OZ CLIKSEAL STRL BL (MISCELLANEOUS) ×6 IMPLANT
CRADLE DONUT ADULT HEAD (MISCELLANEOUS) ×2 IMPLANT
DRAPE HALF SHEET 40X57 (DRAPES) IMPLANT
DRESSING NASAL KENNEDY 3.5X.9 (MISCELLANEOUS) IMPLANT
DRSG NASAL KENNEDY 3.5X.9 (MISCELLANEOUS)
DRSG NASOPORE 8CM (GAUZE/BANDAGES/DRESSINGS) ×1 IMPLANT
DRSG TELFA 3X8 NADH (GAUZE/BANDAGES/DRESSINGS) IMPLANT
ELECT REM PT RETURN 9FT ADLT (ELECTROSURGICAL) ×2
ELECTRODE REM PT RTRN 9FT ADLT (ELECTROSURGICAL) IMPLANT
FILTER ARTHROSCOPY CONVERTOR (FILTER) ×1 IMPLANT
GAUZE SPONGE 2X2 8PLY STRL LF (GAUZE/BANDAGES/DRESSINGS) ×1 IMPLANT
GLOVE BIOGEL PI IND STRL 7.0 (GLOVE) IMPLANT
GLOVE BIOGEL PI INDICATOR 7.0 (GLOVE) ×1
GLOVE ECLIPSE 7.5 STRL STRAW (GLOVE) ×2 IMPLANT
GLOVE SURG SS PI 7.0 STRL IVOR (GLOVE) ×1 IMPLANT
GOWN STRL REUS W/ TWL LRG LVL3 (GOWN DISPOSABLE) ×2 IMPLANT
GOWN STRL REUS W/TWL LRG LVL3 (GOWN DISPOSABLE) ×4
KIT BASIN OR (CUSTOM PROCEDURE TRAY) ×2 IMPLANT
KIT TURNOVER KIT B (KITS) ×2 IMPLANT
NDL PRECISIONGLIDE 27X1.5 (NEEDLE) ×1 IMPLANT
NEEDLE PRECISIONGLIDE 27X1.5 (NEEDLE) ×2 IMPLANT
NS IRRIG 1000ML POUR BTL (IV SOLUTION) ×2 IMPLANT
PAD ARMBOARD 7.5X6 YLW CONV (MISCELLANEOUS) ×3 IMPLANT
PAD DRESSING TELFA 3X8 NADH (GAUZE/BANDAGES/DRESSINGS) ×1 IMPLANT
PATTIES SURGICAL .5 X3 (DISPOSABLE) ×2 IMPLANT
SHEATH ENDOSCRUB 0 DEG (SHEATH) IMPLANT
SHEATH ENDOSCRUB 30 DEG (SHEATH) IMPLANT
SPECIMEN JAR SMALL (MISCELLANEOUS) ×2 IMPLANT
SPONGE GAUZE 2X2 STER 10/PKG (GAUZE/BANDAGES/DRESSINGS) ×1
SUT CHROMIC 4 0 P 3 18 (SUTURE) ×2 IMPLANT
SUT ETHILON 3 0 PS 1 (SUTURE) IMPLANT
SUT PLAIN 4 0 ~~LOC~~ 1 (SUTURE) ×2 IMPLANT
SWAB COLLECTION DEVICE MRSA (MISCELLANEOUS) IMPLANT
SWAB CULTURE ESWAB REG 1ML (MISCELLANEOUS) IMPLANT
SYR 50ML SLIP (SYRINGE) IMPLANT
TOWEL OR 17X24 6PK STRL BLUE (TOWEL DISPOSABLE) ×2 IMPLANT
TRAY ENT MC OR (CUSTOM PROCEDURE TRAY) ×2 IMPLANT
TUBE CONNECTING 12X1/4 (SUCTIONS) ×2 IMPLANT
TUBING EXTENTION W/L.L. (IV SETS) IMPLANT
WATER STERILE IRR 1000ML POUR (IV SOLUTION) ×2 IMPLANT

## 2017-10-06 NOTE — Anesthesia Preprocedure Evaluation (Signed)
Anesthesia Evaluation  Patient identified by MRN, date of birth, ID band Patient awake    Reviewed: Allergy & Precautions, NPO status , Patient's Chart, lab work & pertinent test results  Airway Mallampati: II  TM Distance: >3 FB Neck ROM: Full    Dental no notable dental hx.    Pulmonary neg pulmonary ROS, former smoker,    Pulmonary exam normal breath sounds clear to auscultation       Cardiovascular + Peripheral Vascular Disease  Normal cardiovascular exam Rhythm:Regular Rate:Normal     Neuro/Psych negative neurological ROS  negative psych ROS   GI/Hepatic negative GI ROS, Neg liver ROS,   Endo/Other  negative endocrine ROS  Renal/GU Renal disease     Musculoskeletal  (+) Arthritis ,   Abdominal   Peds  Hematology negative hematology ROS (+)   Anesthesia Other Findings   Reproductive/Obstetrics negative OB ROS                             Anesthesia Physical Anesthesia Plan  ASA: II  Anesthesia Plan: General   Post-op Pain Management:    Induction: Intravenous  PONV Risk Score and Plan: 3 and Ondansetron and Dexamethasone  Airway Management Planned: Oral ETT  Additional Equipment:   Intra-op Plan:   Post-operative Plan: Extubation in OR  Informed Consent: I have reviewed the patients History and Physical, chart, labs and discussed the procedure including the risks, benefits and alternatives for the proposed anesthesia with the patient or authorized representative who has indicated his/her understanding and acceptance.   Dental advisory given  Plan Discussed with: CRNA  Anesthesia Plan Comments:         Anesthesia Quick Evaluation

## 2017-10-06 NOTE — Op Note (Signed)
OPERATIVE REPORT  DATE OF SURGERY: 10/06/2017  PATIENT:  Alexander English,  62 y.o. male  PRE-OPERATIVE DIAGNOSIS:  Sinusitis and Papilloma  POST-OPERATIVE DIAGNOSIS:  Sinusitis and Papilloma  PROCEDURE:  Procedure(s): ENDOSCOPIC SINUS SURGERY EXCISION LEFT NASAL MASS  SURGEON:  Beckie Salts, MD  ASSISTANTS: None  ANESTHESIA:   General   EBL: 600 ml  DRAINS: None  LOCAL MEDICATIONS USED: 1% Xylocaine with epinephrine  SPECIMEN: Main specimen left sinonasal mass.  Multiple frozen sections, frozen section analysis all consistent with either inflammatory mucosa or papilloma with cytologic atypia.  No definitive invasive carcinoma.  COUNTS:  Correct  PROCEDURE DETAILS: The patient was taken to the operating room and placed on the operating table in the supine position. Following induction of general endotracheal anesthesia, the face was draped in standard fashion.  Afrin spray was used preoperatively.  The 0, 30, and 70 degree nasal endoscopes were used throughout the case.  The nasal cavity mass was identified and the microdebrider was used for debulking of the tumor back towards the infundibular area.  Prior to and after the initial debulking local anesthetic was infiltrated into the lateral nasal wall, the inferior turbinate, the posterior and superior attachments of the middle turbinate.  The mass was dissected back towards the ethmoid bulla.  The anterior ethmoid cavity was opened and all bony walls/septations were taken down using the microdebrider.  The dissection was continued all the way up to the fovea which was intact.  There is some mucopus in some of the cells and some inflammatory mucosa but no obvious papilloma.  The frontal recess was completely clear.  A margin was taken at the frontal recess.  This was negative.  An additional margin was taken at the lateral ethmoid margin.  This is also negative on frozen section.  The middle turbinate did not appear to be  involved and was kept intact.  The inferior turbinate was partially resected, the anterior two thirds down to the lateral nasal wall due to the proximity of the obvious papilloma.  The maxillary antrum had a wide open ostium that was filled with papillomatous growth.  The entire maxillary sinus was stripped in all directions.  Additional frozen sections were taken from the posterolateral, lateral, medial, and anterior areas.  The most suspicious appearing lesion was the posterolateral recess which contained some thinning bone and possibly some bony dehiscence may be even bony invasion of either papilloma or carcinoma.  There is no definitive invasive carcinoma on frozen section however.  The antrostomy was enlarged anteriorly using backbiting forceps and the anterior recess was cleaned out of all abnormal mucosa.  The entire sinus was stripped completely.  There is no further obvious neoplasm identified.  The frontal recess was packed with a small piece of nasal pore packing.  Half of a nasal pore was then used to pack the ethmoid cavity and 1-1/2 were used to pack the maxillary sinus.  The pharynx was suctioned of blood and secretions.  Patient was awakened extubated and transferred to recovery in stable condition.    PATIENT DISPOSITION:  To PACU, stable

## 2017-10-06 NOTE — Interval H&P Note (Signed)
History and Physical Interval Note:  10/06/2017 9:20 AM  Alexander English  has presented today for surgery, with the diagnosis of Sinusitis and Papilloma  The various methods of treatment have been discussed with the patient and family. After consideration of risks, benefits and other options for treatment, the patient has consented to  Procedure(s) with comments: ENDOSCOPIC SINUS SURGERY (Left) - Left side Endoscopic Macillary and Ethmoid Sinus EXCISION NASAL MASS (Left) - Removal of intvering papilloma frozen section as a surgical intervention .  The patient's history has been reviewed, patient examined, no change in status, stable for surgery.  I have reviewed the patient's chart and labs.  Questions were answered to the patient's satisfaction.     [name]

## 2017-10-06 NOTE — Anesthesia Procedure Notes (Signed)
Procedure Name: Intubation Date/Time: 10/06/2017 10:04 AM Performed by: White, Amedeo Plenty, CRNA Pre-anesthesia Checklist: Patient identified, Emergency Drugs available, Suction available and Patient being monitored Patient Re-evaluated:Patient Re-evaluated prior to induction Oxygen Delivery Method: Circle System Utilized Preoxygenation: Pre-oxygenation with 100% oxygen Induction Type: IV induction Ventilation: Mask ventilation without difficulty Laryngoscope Size: Mac and 4 Grade View: Grade III Tube type: Oral Tube size: 7.0 mm Number of attempts: 1 Airway Equipment and Method: Stylet Placement Confirmation: ETT inserted through vocal cords under direct vision,  positive ETCO2 and breath sounds checked- equal and bilateral Secured at: 24 cm Tube secured with: Tape Dental Injury: Teeth and Oropharynx as per pre-operative assessment

## 2017-10-06 NOTE — Transfer of Care (Signed)
Immediate Anesthesia Transfer of Care Note  Patient: Alexander English  Procedure(s) Performed: ENDOSCOPIC SINUS SURGERY (Left Nose) EXCISION LEFT NASAL MASS (Left Nose)  Patient Location: PACU  Anesthesia Type:General  Level of Consciousness: awake, alert  and patient cooperative  Airway & Oxygen Therapy: Patient Spontanous Breathing  Post-op Assessment: Report given to RN and Post -op Vital signs reviewed and stable  Post vital signs: Reviewed and stable  Last Vitals:  Vitals Value Taken Time  BP    Temp    Pulse    Resp    SpO2      Last Pain:  Vitals:   10/06/17 0810  PainSc: 0-No pain      Patients Stated Pain Goal: 2 (82/50/53 9767)  Complications: No apparent anesthesia complications

## 2017-10-06 NOTE — Discharge Instructions (Signed)
Use nasal saline spray 2-hour to every hour while awake.

## 2017-10-08 NOTE — Anesthesia Postprocedure Evaluation (Signed)
Anesthesia Post Note  Patient: Alexander English  Procedure(s) Performed: ENDOSCOPIC SINUS SURGERY (Left Nose) EXCISION LEFT NASAL MASS (Left Nose)     Patient location during evaluation: PACU Anesthesia Type: General Level of consciousness: sedated and patient cooperative Pain management: pain level controlled Vital Signs Assessment: post-procedure vital signs reviewed and stable Respiratory status: spontaneous breathing Cardiovascular status: stable Anesthetic complications: no    Last Vitals:  Vitals:   10/06/17 1306 10/06/17 1330  BP:  (!) 146/82  Pulse: 78 77  Resp: 15 16  Temp: 36.6 C   SpO2: 99% 99%    Last Pain:  Vitals:   10/06/17 1330  PainSc: 0-No pain                 Nolon Nations

## 2017-10-09 ENCOUNTER — Encounter (HOSPITAL_COMMUNITY): Payer: Self-pay | Admitting: Otolaryngology

## 2017-10-16 ENCOUNTER — Telehealth: Payer: Self-pay

## 2017-10-16 DIAGNOSIS — C310 Malignant neoplasm of maxillary sinus: Secondary | ICD-10-CM

## 2017-10-16 DIAGNOSIS — F5105 Insomnia due to other mental disorder: Principal | ICD-10-CM

## 2017-10-16 DIAGNOSIS — F409 Phobic anxiety disorder, unspecified: Secondary | ICD-10-CM

## 2017-10-16 MED ORDER — ZOLPIDEM TARTRATE 5 MG PO TABS: 5.0000 mg | ORAL_TABLET | Freq: Every evening | ORAL | 0 refills | Status: DC | PRN

## 2017-10-16 NOTE — Telephone Encounter (Signed)
Requesting to speak with a nurse about getting sleep med. Please call pt back.

## 2017-10-16 NOTE — Telephone Encounter (Signed)
I called Alexander English and was able to speak with him this time.  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  Understandably he is very upset about the news, but states further surgery is planned, he is not sure exactly when as of yet.  Because of the fear and anxiety he is experiencing he is having trouble falling asleep.  He was asking for some pharmacologic assistance, but wants the lowest dose possible.  I advised against doxepin and we decided upon Ambien 5 mg PO QHS PRN insomnia.  He was asked to take 2 tablets (10 mg) at once if he found that the one tablet (5 mg) was ineffective.  He will call me if this were the case.  An Ambien prescription 5 mg PO QHS PRN disp #30 was sent to his preferred pharmacy [facility]).

## 2017-10-16 NOTE — Telephone Encounter (Signed)
[name] has some issue with urge incontinence and I am not sure a TCA is in his best interest.  I tried calling him to discuss alternatives at both his home and mobile numbers and received unidentified voicemails.  I left a message that I would call back when I could.

## 2017-10-16 NOTE — Telephone Encounter (Signed)
Patient is calling to give name of medicine,  Refill Doxepin HCL 25mg . Pls send to [facility] on [address]

## 2017-10-19 ENCOUNTER — Ambulatory Visit (HOSPITAL_COMMUNITY)
Admission: RE | Admit: 2017-10-19 | Discharge: 2017-10-19 | Disposition: A | Discharge status: Home / Self Care | Payer: Medicare HMO | Source: Ambulatory Visit | Attending: Internal Medicine | Admitting: Internal Medicine

## 2017-10-19 DIAGNOSIS — J439 Emphysema, unspecified: Secondary | ICD-10-CM | POA: Insufficient documentation

## 2017-10-19 DIAGNOSIS — Z87891 Personal history of nicotine dependence: Secondary | ICD-10-CM | POA: Insufficient documentation

## 2017-10-19 DIAGNOSIS — Z122 Encounter for screening for malignant neoplasm of respiratory organs: Secondary | ICD-10-CM

## 2017-10-19 DIAGNOSIS — I700 Atherosclerosis of aorta: Secondary | ICD-10-CM | POA: Insufficient documentation

## 2017-10-23 ENCOUNTER — Telehealth: Payer: Self-pay | Admitting: Pharmacist

## 2017-10-23 ENCOUNTER — Encounter: Payer: Self-pay | Admitting: Internal Medicine

## 2017-10-23 ENCOUNTER — Ambulatory Visit (INDEPENDENT_AMBULATORY_CARE_PROVIDER_SITE_OTHER): Payer: Medicare HMO | Admitting: Pharmacist

## 2017-10-23 DIAGNOSIS — K006 Disturbances in tooth eruption: Secondary | ICD-10-CM | POA: Diagnosis not present

## 2017-10-23 DIAGNOSIS — I700 Atherosclerosis of aorta: Secondary | ICD-10-CM

## 2017-10-23 DIAGNOSIS — Z7901 Long term (current) use of anticoagulants: Secondary | ICD-10-CM | POA: Diagnosis not present

## 2017-10-23 DIAGNOSIS — Z5181 Encounter for therapeutic drug level monitoring: Secondary | ICD-10-CM

## 2017-10-23 DIAGNOSIS — Z86718 Personal history of other venous thrombosis and embolism: Secondary | ICD-10-CM

## 2017-10-23 HISTORY — DX: Atherosclerosis of aorta: I70.0

## 2017-10-23 LAB — POCT INR: INR: 2.5

## 2017-10-23 NOTE — Progress Notes (Signed)
Anticoagulation Management Alexander English is a 62 y.o. male who reports to the clinic for monitoring of warfarin treatment.    Indication: DVT, history of; long term current use of anticoagulant. Duration: indefinite Supervising physician: [name]  Anticoagulation Clinic Visit History: Patient does not report signs/symptoms of bleeding or thromboembolism  Other recent changes: No diet, medications, lifestyle changes except as noted in patient findings and upon problem list.  Anticoagulation Episode Summary    Current INR goal:   2.0-3.0  TTR:   83.0 % (5.6 y)  Next INR check:   11/13/2017  INR from last check:   2.50 (10/23/2017)  Weekly max warfarin dose:     Target end date:   Indefinite  INR check location:   Anticoagulation Clinic  Preferred lab:     Send INR reminders to:   [facility]   Indications   History of venous thromboembolism [Z86.718]       Comments:           No Known Allergies Prior to Admission medications   Medication Sig Start Date End Date Taking? Authorizing Provider  aspirin EC 81 MG tablet Take 81 mg by mouth daily.   Yes [provider]  atorvastatin (LIPITOR) 10 MG tablet Take 1 tablet (10 mg total) daily by mouth. 05/12/17  Yes [name], MD  fesoterodine (TOVIAZ) 8 MG TB24 tablet Take 8 mg by mouth daily.   Yes [provider]  HYDROcodone-acetaminophen (NORCO) 7.5-325 MG tablet Take 1 tablet by mouth every 6 (six) hours as needed for moderate pain. 10/06/17  Yes [name], MD  mirabegron ER (MYRBETRIQ) 25 MG TB24 tablet Take 25 mg by mouth daily.    Yes [provider]  Multiple Vitamin (MULTIVITAMIN) tablet Take 1 tablet by mouth daily.   Yes [provider]  oxyCODONE-acetaminophen (PERCOCET/ROXICET) 5-325 MG tablet Take 1-2 tablets by mouth daily as needed for severe pain. 03/31/17  Yes [name], MD  promethazine (PHENERGAN) 25 MG suppository Place 1 suppository (25 mg total) rectally  every 6 (six) hours as needed for nausea or vomiting. 10/06/17  Yes [name], MD  sorbitol 70 % solution Take 15 mLs by mouth daily as needed. Patient taking differently: Take 15 mLs by mouth daily as needed (constipation).  03/31/17  Yes [name], MD  warfarin (COUMADIN) 5 MG tablet Take 1 & 1/2 tablets on Mondays and Thursdays; all other days, take one (1) tablet by mouth at [facility]. Patient taking differently: Take 1 & 1/2 tablets on Mondays and Wednesday ; all other days, take one (1) tablet by mouth at 6PM. 06/12/17  Yes [name], RPH-CPP  zolpidem (AMBIEN) 5 MG tablet Take 1 tablet (5 mg total) by mouth at bedtime as needed for sleep. 10/16/17  Yes [name], MD   Past Medical History:  Diagnosis Date  . Aortic atherosclerosis ([place]) 10/23/2017   Asymptomatic, seen on CT scan  . Bilateral cataracts 05/18/2016   Not visually significant  . Chronic constipation 06/12/2013  . Chronic low back pain 06/12/2013   L4-5 facet arthropathy   . Chronic pain of both shoulders 12/07/2013   A/C joint osteoarthritis   . Cluster headaches    Resolved in 2006  . Dry eye syndrome, bilateral 05/18/2016  . Embolic stroke involving right middle cerebral artery ([place]) 08/08/2004   Occured after traveling from [place] to [place] where he was hospitalized for 1 month with no further work-up or therapy.  Returned to [place] unable  to walk and was admitted to [facility] immediately after landing. Presumed embolic per neuro but TEE, bubble study, and hypercoag W/U negative. On indefinite coumadin per patient's informed preference.  Residual effects : Left spastic hemiplegia. Tx with phenol tibi  . History of kidney stones   . Hyperplastic colon polyp 07/27/2012   Excised endoscopically 07/27/2012  . Hypertensive retinopathy of both eyes 05/18/2016  . Left nephrolithiasis 08/31/2015   With mild left hydronephrosis.  Scheduled to undergo shockwave lithotripsy.  . Obesity (BMI 30.0-34.9) 12/07/2013  . Osteoarthritis of  both knees 01/26/2012  . Positive PPD 12/07/2013  . Pulmonary embolism ([place]) 09/30/2004   Diagnosed in April 2006 after returning from [place].  Likely provoked as it occurred after a 12 hour plane flight within 2 months of R MCA CVA with left hemiparesis. Patient has made an informed decision to remain on lifelong warfarin.   . Urge incontinence of urine 08/31/2015   Possibly secondary to prior CVA.  Treated with Myrbetriq.   Social History   Socioeconomic History  . Marital status: Divorced    Spouse name: Not on file  . Number of children: Not on file  . Years of education: Not on file  . Highest education level: Not on file  Occupational History  . Not on file  Social Needs  . Financial resource strain: Not on file  . Food insecurity:    Worry: Not on file    Inability: Not on file  . Transportation needs:    Medical: Not on file    Non-medical: Not on file  Tobacco Use  . Smoking status: Former Smoker    Last attempt to quit: 06/28/2007    Years since quitting: 10.3  . Smokeless tobacco: Never Used  Substance and Sexual Activity  . Alcohol use: No    Alcohol/week: 0.0 oz    Comment: Remote past  . Drug use: No  . Sexual activity: Not on file  Lifestyle  . Physical activity:    Days per week: Not on file    Minutes per session: Not on file  . Stress: Not on file  Relationships  . Social connections:    Talks on phone: Not on file    Gets together: Not on file    Attends religious service: Not on file    Active member of club or organization: Not on file    Attends meetings of clubs or organizations: Not on file    Relationship status: Not on file  Other Topics Concern  . Not on file  Social History Narrative   Born in [place] but has been in the [place] for > 30 years.  Divorced, 1 daughter and 2 sons.  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   Family History  Problem Relation Age of Onset  . Stroke Maternal Grandmother   .  Unexplained death Mother   . Depression Mother        After husband's death  . Unexplained death Father   . Osteoarthritis Father        Knees  . Early death Sister   . Seizures Sister   . Early death Brother 74       Unknown cause  . Healthy Daughter   . Healthy Son   . Stroke Maternal Aunt   . Healthy Sister   . Healthy Sister   . Unexplained death Brother   . Healthy Brother   . Healthy Son  ASSESSMENT Recent Results: The most recent result is correlated with 35 mg per week: Lab Results  Component Value Date   INR 2.50 10/23/2017   INR 1.3 10/04/2017   INR 2.25 09/28/2017    Anticoagulation Dosing: Description   Take one (1) tablet of your 5mg  peach-colored warfarin tablets by mouth, once-daily at 6PM.      INR today: Therapeutic  PLAN Weekly dose was unchanged.  Patient Instructions  Patient instructed to take medications as defined in the Anti-coagulation Track section of this encounter.  Patient instructed to take today's dose.  Patient instructed to take one (1) of your 5mg  peach-colored warfarin tablets by mouth, once-daily at [facility].  Patient verbalized understanding of these instructions.      Patient advised to contact clinic or seek medical attention if signs/symptoms of bleeding or thromboembolism occur.  Patient verbalized understanding by repeating back information and was advised to contact me if further medication-related questions arise. Patient was also provided an information handout.  Follow-up Return in about 3 weeks (around 11/13/2017) for Follow up INR at 0930h.  [name], PharmD, CACP, CPP  15 minutes spent face-to-face with the patient during the encounter. 50% of time spent on education. 50% of time was spent on fingerstick point of care INR sample collection, processing, results determination and documentation in [email or web address].

## 2017-10-23 NOTE — Telephone Encounter (Signed)
Left message that appointment for anticoagulation management clinic must be rescheduled to Nov 06, 2017 or November 27, 2017. He was instructed by message left--to call me to determine which slot he wants to come to anticoagulation management clinic.

## 2017-10-23 NOTE — Patient Instructions (Signed)
Patient instructed to take medications as defined in the Anti-coagulation Track section of this encounter.  Patient instructed to take today's dose.  Patient instructed to take one (1) of your 5mg peach-colored warfarin tablets by mouth, once-daily at 6PM. Patient verbalized understanding of these instructions.    

## 2017-10-27 DIAGNOSIS — K006 Disturbances in tooth eruption: Secondary | ICD-10-CM | POA: Diagnosis not present

## 2017-10-28 NOTE — H&P (Signed)
Alexander English is an 62 y.o. male.   Chief Complaint: maxillary cancer HPI: History of left-sided nasal obstruction and nosebleeds.  Biopsy revealed squamous cell carcinoma.      Past Medical History:  Diagnosis Date  . Bilateral cataracts 05/18/2016   Not visually significant  . Chronic constipation 06/12/2013  . Chronic low back pain 06/12/2013   L4-5 facet arthropathy   . Chronic pain of both shoulders 12/07/2013   A/C joint osteoarthritis   . Cluster headaches    Resolved in 2006  . Dry eye syndrome, bilateral 05/18/2016  . Embolic stroke involving right middle cerebral artery ([place]) 08/08/2004   Occured after traveling from [place] to [place] where he was hospitalized for 1 month with no further work-up or therapy.  Returned to [place] unable to walk and was admitted to [facility] immediately after landing. Presumed embolic per neuro but TEE, bubble study, and hypercoag W/U negative. On indefinite coumadin per patient's informed preference.  Residual effects : Left spastic hemiplegia. Tx with phenol tibi  . Hyperplastic colon polyp 07/27/2012   Excised endoscopically 07/27/2012  . Hypertensive retinopathy of both eyes 05/18/2016  . Left nephrolithiasis 08/31/2015   With mild left hydronephrosis.  Scheduled to undergo shockwave lithotripsy.  . Obesity (BMI 30.0-34.9) 12/07/2013  . Osteoarthritis of both knees 01/26/2012  . Positive PPD 12/07/2013  . Pulmonary embolism ([place]) 09/30/2004   Diagnosed in April 2006 after returning from [place].  Likely provoked as it occurred after a 12 hour plane flight within 2 months of R MCA CVA with left hemiparesis. Patient has made an informed decision to remain on lifelong warfarin.   . Urge incontinence of urine 08/31/2015   Possibly secondary to prior CVA.  Treated with Myrbetriq.         Past Surgical History:  Procedure Laterality Date  . FOOT SURGERY Bilateral    Bunion  . I&D EXTREMITY Left 11/28/2001   Left thumb thenar space abscess.          Family History  Problem Relation Age of Onset  . Stroke Maternal Grandmother   . Unexplained death Mother   . Depression Mother        After husband's death  . Unexplained death Father   . Osteoarthritis Father        Knees  . Early death Sister   . Seizures Sister   . Early death Brother 51       Unknown cause  . Healthy Daughter   . Healthy Son   . Stroke Maternal Aunt   . Healthy Sister   . Healthy Sister   . Unexplained death Brother   . Healthy Brother   . Healthy Son    Social History:  reports that he quit smoking about 10 years ago. he has never used smokeless tobacco. He reports that he does not drink alcohol or use drugs.  Allergies: No Known Allergies  No medications prior to admission.    LabResultsLast48Hours  No results found for this or any previous visit (from the past 48 hour(s)).   ImagingResults(Last48hours)  No results found.    ROS: otherwise negative  There were no vitals taken for this visit.  PHYSICAL EXAM: Overall appearance:  Healthy appearing, in no distress Head:  Normocephalic, atraumatic. Ears: External auditory canals are clear; tympanic membranes are intact and the middle ears are free of any effusion. Nose: External nose is healthy in appearance. Internal nasal exam reveals a mass completely filling the left nasal cavity. Oral  Cavity/pharynx:  There are no mucosal lesions or masses identified. Hypopharynx/Larynx: no signs of any mucosal lesions or masses identified. Vocal cords move normally. Neuro:  No identifiable neurologic deficits. Neck: No palpable neck masses.  Studies Reviewed: Maxillofacial CT    Assessment/Plan Inverting papilloma with in situ carcinoma.  Recommend surgical resection endoscopic, including maxillary and ethmoid surgery, possible turbinectomy, possible medial maxillectomy.  [name] 09/13/2017, 10:32  AM

## 2017-10-28 NOTE — Progress Notes (Signed)
Indication: History of venous thromboembolism. Duration: Indefinite per patient preference. INR: At target. Agree with Dr. Groce's assessment and plan. 

## 2017-11-06 ENCOUNTER — Ambulatory Visit (INDEPENDENT_AMBULATORY_CARE_PROVIDER_SITE_OTHER): Payer: Medicare HMO | Admitting: Internal Medicine

## 2017-11-06 ENCOUNTER — Ambulatory Visit (INDEPENDENT_AMBULATORY_CARE_PROVIDER_SITE_OTHER): Payer: Medicare HMO | Admitting: Pharmacist

## 2017-11-06 ENCOUNTER — Other Ambulatory Visit: Payer: Self-pay

## 2017-11-06 VITALS — BP 130/66 | HR 81 | Temp 98.1°F | Ht 72.0 in | Wt 233.3 lb

## 2017-11-06 DIAGNOSIS — Z5181 Encounter for therapeutic drug level monitoring: Secondary | ICD-10-CM

## 2017-11-06 DIAGNOSIS — R531 Weakness: Secondary | ICD-10-CM

## 2017-11-06 DIAGNOSIS — Z86718 Personal history of other venous thrombosis and embolism: Secondary | ICD-10-CM | POA: Diagnosis not present

## 2017-11-06 DIAGNOSIS — R3912 Poor urinary stream: Secondary | ICD-10-CM

## 2017-11-06 DIAGNOSIS — Z79899 Other long term (current) drug therapy: Secondary | ICD-10-CM | POA: Diagnosis not present

## 2017-11-06 DIAGNOSIS — Z7901 Long term (current) use of anticoagulants: Secondary | ICD-10-CM

## 2017-11-06 DIAGNOSIS — N3941 Urge incontinence: Secondary | ICD-10-CM | POA: Diagnosis not present

## 2017-11-06 LAB — POCT INR: INR: 2.1

## 2017-11-06 MED ORDER — FESOTERODINE FUMARATE ER 4 MG PO TB24: 4.0000 mg | ORAL_TABLET | Freq: Every day | ORAL | 0 refills | Status: DC

## 2017-11-06 NOTE — Progress Notes (Signed)
Anticoagulation Management Alexander English is a 62 y.o. male who reports to the clinic for monitoring of warfarin treatment.    Indication: DVT, history of; long term current use of anticoagulant.   Duration: indefinite Supervising physician: [name], MD  Anticoagulation Clinic Visit History: Patient does not report signs/symptoms of bleeding or thromboembolism  Other recent changes: No diet, medications, lifestyle changes endorsed other than as noted in patient findings.  Anticoagulation Episode Summary    Current INR goal:   2.0-3.0  TTR:   83.1 % (5.6 y)  Next INR check:   11/15/2017  INR from last check:   2.10 (11/06/2017)  Weekly max warfarin dose:     Target end date:   Indefinite  INR check location:   Anticoagulation Clinic  Preferred lab:     Send INR reminders to:   ANT[facility]   Indications   History of venous thromboembolism [Z86.718]       Comments:           No Known Allergies Prior to Admission medications   Medication Sig Start Date End Date Taking? Authorizing Provider  aspirin EC 81 MG tablet Take 81 mg by mouth daily.   Yes [provider]  atorvastatin (LIPITOR) 10 MG tablet Take 1 tablet (10 mg total) daily by mouth. 05/12/17  Yes [name], MD  fesoterodine (TOVIAZ) 8 MG TB24 tablet Take 8 mg by mouth daily.   Yes [provider]  HYDROcodone-acetaminophen (NORCO) 7.5-325 MG tablet Take 1 tablet by mouth every 6 (six) hours as needed for moderate pain. 10/06/17  Yes [name], MD  mirabegron ER (MYRBETRIQ) 25 MG TB24 tablet Take 25 mg by mouth daily.    Yes [provider]  Multiple Vitamin (MULTIVITAMIN) tablet Take 1 tablet by mouth daily.   Yes [provider]  oxyCODONE-acetaminophen (PERCOCET/ROXICET) 5-325 MG tablet Take 1-2 tablets by mouth daily as needed for severe pain. 03/31/17  Yes [name], MD  promethazine (PHENERGAN) 25 MG suppository Place 1 suppository (25 mg total) rectally  every 6 (six) hours as needed for nausea or vomiting. 10/06/17  Yes [name], MD  sorbitol 70 % solution Take 15 mLs by mouth daily as needed. Patient taking differently: Take 15 mLs by mouth daily as needed (constipation).  03/31/17  Yes [name], MD  warfarin (COUMADIN) 5 MG tablet Take 1 & 1/2 tablets on Mondays and Thursdays; all other days, take one (1) tablet by mouth at [facility]. Patient taking differently: Take 1 & 1/2 tablets on Mondays and Wednesday ; all other days, take one (1) tablet by mouth at 6PM. 06/12/17  Yes [name], RPH-CPP  zolpidem (AMBIEN) 5 MG tablet Take 1 tablet (5 mg total) by mouth at bedtime as needed for sleep. 10/16/17  Yes [name], MD   Past Medical History:  Diagnosis Date  . Aortic atherosclerosis ([place]) 10/23/2017   Asymptomatic, seen on CT scan  . Bilateral cataracts 05/18/2016   Not visually significant  . Chronic constipation 06/12/2013  . Chronic low back pain 06/12/2013   L4-5 facet arthropathy   . Chronic pain of both shoulders 12/07/2013   A/C joint osteoarthritis   . Cluster headaches    Resolved in 2006  . Dry eye syndrome, bilateral 05/18/2016  . Embolic stroke involving right middle cerebral artery ([place]) 08/08/2004   Occured after traveling from [place] to [place] where he was hospitalized for 1 month with no further work-up or therapy.  Returned to [place]  unable to walk and was admitted to [facility] immediately after landing. Presumed embolic per neuro but TEE, bubble study, and hypercoag W/U negative. On indefinite coumadin per patient's informed preference.  Residual effects : Left spastic hemiplegia. Tx with phenol tibi  . History of kidney stones   . Hyperplastic colon polyp 07/27/2012   Excised endoscopically 07/27/2012  . Hypertensive retinopathy of both eyes 05/18/2016  . Left nephrolithiasis 08/31/2015   With mild left hydronephrosis.  Scheduled to undergo shockwave lithotripsy.  . Obesity (BMI 30.0-34.9) 12/07/2013  . Osteoarthritis of  both knees 01/26/2012  . Positive PPD 12/07/2013  . Pulmonary embolism ([place]) 09/30/2004   Diagnosed in April 2006 after returning from [place].  Likely provoked as it occurred after a 12 hour plane flight within 2 months of R MCA CVA with left hemiparesis. Patient has made an informed decision to remain on lifelong warfarin.   . Urge incontinence of urine 08/31/2015   Possibly secondary to prior CVA.  Treated with Myrbetriq.   Social History   Socioeconomic History  . Marital status: Divorced    Spouse name: Not on file  . Number of children: Not on file  . Years of education: Not on file  . Highest education level: Not on file  Occupational History  . Not on file  Social Needs  . Financial resource strain: Not on file  . Food insecurity:    Worry: Not on file    Inability: Not on file  . Transportation needs:    Medical: Not on file    Non-medical: Not on file  Tobacco Use  . Smoking status: Former Smoker    Last attempt to quit: 06/28/2007    Years since quitting: 10.3  . Smokeless tobacco: Never Used  Substance and Sexual Activity  . Alcohol use: No    Alcohol/week: 0.0 oz    Comment: Remote past  . Drug use: No  . Sexual activity: Not on file  Lifestyle  . Physical activity:    Days per week: Not on file    Minutes per session: Not on file  . Stress: Not on file  Relationships  . Social connections:    Talks on phone: Not on file    Gets together: Not on file    Attends religious service: Not on file    Active member of club or organization: Not on file    Attends meetings of clubs or organizations: Not on file    Relationship status: Not on file  Other Topics Concern  . Not on file  Social History Narrative   Born in [place] but has been in the [place] for > 30 years.  Divorced, 1 daughter and 2 sons.  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   Family History  Problem Relation Age of Onset  . Stroke Maternal Grandmother   .  Unexplained death Mother   . Depression Mother        After husband's death  . Unexplained death Father   . Osteoarthritis Father        Knees  . Early death Sister   . Seizures Sister   . Early death Brother 68       Unknown cause  . Healthy Daughter   . Healthy Son   . Stroke Maternal Aunt   . Healthy Sister   . Healthy Sister   . Unexplained death Brother   . Healthy Brother   . Healthy  Son     ASSESSMENT Recent Results: The most recent result is correlated with 35 mg per week: Lab Results  Component Value Date   INR 2.10 11/06/2017   INR 2.50 10/23/2017   INR 1.3 10/04/2017    Anticoagulation Dosing: Description   Take one (1) tablet of your 5mg  peach-colored warfarin tablets by mouth, once-daily at 6PM.      INR today: Therapeutic  PLAN Weekly dose was unchanged.   Patient Instructions  Patient instructed to take medications as defined in the Anti-coagulation Track section of this encounter.  Patient instructed to take today's dose.  Patient instructed to take one (1) of your 5mg  peach-colored warfarin tablets by mouth, once-daily, at [facility].  Patient instructed to DISCONTINUE your aspirin (based upon instructions you stated your ENT surgeon advised) on Nov 11, 2017. Patient verbalized understanding of these instructions.     Patient advised to contact clinic or seek medical attention if signs/symptoms of bleeding or thromboembolism occur.  Patient verbalized understanding by repeating back information and was advised to contact me if further medication-related questions arise. Patient was also provided an information handout.  Follow-up Return in about 9 days (around 11/15/2017) for Follow up INR at 0930h.  [name], PharmD, CACP, CPP  15 minutes spent face-to-face with the patient during the encounter. 50% of time spent on education. 50% of time was spent on fingerstick point of care INR sample collection, processing, results determination, and  documentation in [email or web address].

## 2017-11-06 NOTE — Patient Instructions (Signed)
I have sent a new prescription for Toviaz at half the strength of your current medicine. I recommend stopping the old 8mg  pill and starting the 4mg  pill daily instead.  If this helps your symptoms we can continue at the lower dose. If it does not help I recommend calling your Urologist's office for further advice on this medicine.

## 2017-11-06 NOTE — Patient Instructions (Signed)
Patient instructed to take medications as defined in the Anti-coagulation Track section of this encounter.  Patient instructed to take today's dose.  Patient instructed to take one (1) of your 5mg  peach-colored warfarin tablets by mouth, once-daily, at [facility].  Patient instructed to DISCONTINUE your aspirin (based upon instructions you stated your ENT surgeon advised) on Nov 11, 2017. Patient verbalized understanding of these instructions.

## 2017-11-06 NOTE — Assessment & Plan Note (Signed)
HPI: 2 months ago he visited Urology for follow up of an elevated PSA checked at our office. At that time his PSA was repeated and he was told it was 0.8 (pt reported). He was recommended to follow up in 6 months due to the large test to test variation. He was also started on Toviaz for his urinary urge incontinence. Since starting this medicine he had a large improvement in his urge incontinence with no more wetting of his clothing before reaching the bathroom and decreased nocturia. However he is taking a very long time to initiate urination and to empty his bladder. He does not feel like there is a problem with incomplete voiding. Assessment: This appear to be excessive treatment of urinary urge incontinence with addition of Toviaz (muscarinic antagonist) to his previous Myrbetriq (beta-3 agonist) Plan: I recommended a decrease in the Toviaz to 4mg  daily.  If he does not have improvement of the symptom medic complaints on the lower dose I recommend he try calling his urologist office to discuss if he should stop the medicine.

## 2017-11-06 NOTE — Progress Notes (Signed)
INTERNAL MEDICINE TEACHING ATTENDING ADDENDUM  I agree with pharmacy recommendations as outlined in their note.   Alexander N Raines, MD  

## 2017-11-06 NOTE — Progress Notes (Signed)
CC: Weak urine stream  HPI:  [name] is a 62 y.o. male with PMHx detailed below presenting due to difficulty initiating urination and a weak urine stream for the past few weeks.  See problem based assessment and plan below for additional details.  Urge incontinence of urine HPI: 2 months ago he visited Urology for follow up of an elevated PSA checked at our office. At that time his PSA was repeated and he was told it was 0.8 (pt reported). He was recommended to follow up in 6 months due to the large test to test variation. He was also started on Toviaz for his urinary urge incontinence. Since starting this medicine he had a large improvement in his urge incontinence with no more wetting of his clothing before reaching the bathroom and decreased nocturia. However he is taking a very long time to initiate urination and to empty his bladder. He does not feel like there is a problem with incomplete voiding. Assessment: This appear to be excessive treatment of urinary urge incontinence with addition of Toviaz (muscarinic antagonist) to his previous Myrbetriq (beta-3 agonist) Plan: I recommended a decrease in the Toviaz to 4mg  daily.  If he does not have improvement of the symptom medic complaints on the lower dose I recommend he try calling his urologist office to discuss if he should stop the medicine.   Past Medical History:  Diagnosis Date  . Aortic atherosclerosis ([place]) 10/23/2017   Asymptomatic, seen on CT scan  . Bilateral cataracts 05/18/2016   Not visually significant  . Chronic constipation 06/12/2013  . Chronic low back pain 06/12/2013   L4-5 facet arthropathy   . Chronic pain of both shoulders 12/07/2013   A/C joint osteoarthritis   . Cluster headaches    Resolved in 2006  . Dry eye syndrome, bilateral 05/18/2016  . Embolic stroke involving right middle cerebral artery ([place]) 08/08/2004   Occured after traveling from [place] to [place] where he was hospitalized for 1  month with no further work-up or therapy.  Returned to [place] unable to walk and was admitted to [facility] immediately after landing. Presumed embolic per neuro but TEE, bubble study, and hypercoag W/U negative. On indefinite coumadin per patient's informed preference.  Residual effects : Left spastic hemiplegia. Tx with phenol tibi  . History of kidney stones   . Hyperplastic colon polyp 07/27/2012   Excised endoscopically 07/27/2012  . Hypertensive retinopathy of both eyes 05/18/2016  . Left nephrolithiasis 08/31/2015   With mild left hydronephrosis.  Scheduled to undergo shockwave lithotripsy.  . Obesity (BMI 30.0-34.9) 12/07/2013  . Osteoarthritis of both knees 01/26/2012  . Positive PPD 12/07/2013  . Pulmonary embolism ([place]) 09/30/2004   Diagnosed in April 2006 after returning from [place].  Likely provoked as it occurred after a 12 hour plane flight within 2 months of R MCA CVA with left hemiparesis. Patient has made an informed decision to remain on lifelong warfarin.   . Urge incontinence of urine 08/31/2015   Possibly secondary to prior CVA.  Treated with Myrbetriq.    Review of Systems: Review of Systems  Cardiovascular: Negative for leg swelling.  Gastrointestinal: Negative for constipation and diarrhea.  Genitourinary: Negative for dysuria, flank pain, frequency, hematuria and urgency.  Musculoskeletal: Negative for falls.  Neurological: Positive for focal weakness.  Endo/Heme/Allergies: Negative for polydipsia.     Physical Exam: Vitals:   11/06/17 1023  BP: 130/66  Pulse: 81  Temp: 98.1 F (36.7 C)  TempSrc: Oral  SpO2:  100%  Weight: 233 lb 4.8 oz (105.8 kg)  Height: 6' (1.829 m)   GENERAL- alert, co-operative, NAD HEENT- Oral mucosa appears moist CARDIAC- RRR, no murmurs, rubs or gallops. RESP- CTAB, no wheezes or crackles. ABDOMEN- Soft, no suprapubic fullness or tenderness BACK- No CVA tenderness EXTREMITIES- left leg brace, left arm held in flexed position with spasticity, no  pedal edema SKIN- Warm, dry, No rash or lesion.   Assessment & Plan:   See encounters tab for problem based medical decision making.   Patient discussed with Dr. Rebeca Alert

## 2017-11-08 NOTE — Progress Notes (Signed)
Internal Medicine Clinic Attending  Case discussed with Dr. Rice  at the time of the visit.  We reviewed the resident's history and exam and pertinent patient test results.  I agree with the assessment, diagnosis, and plan of care documented in the resident's note.  Alexander N Raines, MD   

## 2017-11-10 ENCOUNTER — Other Ambulatory Visit: Payer: Self-pay | Admitting: Pharmacist

## 2017-11-10 DIAGNOSIS — Z86711 Personal history of pulmonary embolism: Secondary | ICD-10-CM

## 2017-11-13 NOTE — Pre-Procedure Instructions (Signed)
CASSELL VOORHIES  11/13/2017      [facility] [place] [address] [place] ([place] [address] Phone: [phone] Fax: [phone]    Your procedure is scheduled on  Wednesday 11/22/17  Report to [facility] Admitting at 850 A.M.  Call this number if you have problems the morning of surgery:  [phone]   Remember:  No food after midnight.     Take these medicines the morning of surgery with A SIP OF WATER-  TOVIAZ, MYRBETRIQ  7 days prior to surgery STOP taking any Aspirin(unless otherwise instructed by your surgeon), Aleve, Naproxen, Ibuprofen, Motrin, Advil, Goody's, BC's, all herbal medications, fish oil, and all vitamins    Do not wear jewelry, make-up or nail polish.  Do not wear lotions, powders, or perfumes, or deodorant.  Do not shave 48 hours prior to surgery.  Men may shave face and neck.  Do not bring valuables to the hospital.  [facility] is not responsible for any belongings or valuables.  Contacts, dentures or bridgework may not be worn into surgery.  Leave your suitcase in the car.  After surgery it may be brought to your room.  For patients admitted to the hospital, discharge time will be determined by your treatment team.  Patients discharged the day of surgery will not be allowed to drive home.   Name and phone number of your driver:    Special instructions:  [facility] - Preparing for Surgery  Before surgery, you can play an important role.  Because skin is not sterile, your skin needs to be as free of germs as possible.  You can reduce the number of germs on you skin by washing with CHG (chlorahexidine gluconate) soap before surgery.  CHG is an antiseptic cleaner which kills germs and bonds with the skin to continue killing germs even after washing.  Oral Hygiene is also important in reducing the risk of infection.  Remember to brush your teeth with your regular toothpaste the morning  of surgery.  Please DO NOT use if you have an allergy to CHG or antibacterial soaps.  If your skin becomes reddened/irritated stop using the CHG and inform your nurse when you arrive at Short Stay.  Do not shave (including legs and underarms) for at least 48 hours prior to the first CHG shower.  You may shave your face.  Please follow these instructions carefully:   1.  Shower with CHG Soap the night before surgery and the morning of Surgery.  2.  If you choose to wash your hair, wash your hair first as usual with your normal shampoo.  3.  After you shampoo, rinse your hair and body thoroughly to remove the shampoo. 4.  Use CHG as you would any other liquid soap.  You can apply chg directly to the skin and wash gently with a      scrungie or washcloth.           5.  Apply the CHG Soap to your body ONLY FROM THE NECK DOWN.   Do not use on open wounds or open sores. Avoid contact with your eyes, ears, mouth and genitals (private parts).  Wash genitals (private parts) with your normal soap.  6.  Wash thoroughly, paying special attention to the area where your surgery will be performed.  7.  Thoroughly rinse your body with warm water from the neck down.  8.  DO  NOT shower/wash with your normal soap after using and rinsing off the CHG Soap.  9.  Pat yourself dry with a clean towel.            10.  Wear clean pajamas.            11.  Place clean sheets on your bed the night of your first shower and do not sleep with pets.  Day of Surgery  Do not apply any lotions/deoderants the morning of surgery.   Please wear clean clothes to the hospital/surgery center. Remember to brush your teeth with toothpaste.     Please read over the following fact sheets that you were given. Pain Booklet

## 2017-11-14 ENCOUNTER — Encounter (HOSPITAL_COMMUNITY): Payer: Self-pay

## 2017-11-14 ENCOUNTER — Encounter (HOSPITAL_COMMUNITY)
Admission: RE | Admit: 2017-11-14 | Discharge: 2017-11-14 | Disposition: A | Discharge status: Home / Self Care | Payer: Medicare HMO | Source: Ambulatory Visit | Attending: Otolaryngology | Admitting: Otolaryngology

## 2017-11-14 DIAGNOSIS — Z01812 Encounter for preprocedural laboratory examination: Secondary | ICD-10-CM | POA: Diagnosis not present

## 2017-11-14 HISTORY — DX: Chronic obstructive pulmonary disease, unspecified: J44.9

## 2017-11-14 LAB — CBC
HCT: 37.9 % — ABNORMAL LOW (ref 39.0–52.0)
Hemoglobin: 11.4 g/dL — ABNORMAL LOW (ref 13.0–17.0)
MCH: 23.3 pg — ABNORMAL LOW (ref 26.0–34.0)
MCHC: 30.1 g/dL (ref 30.0–36.0)
MCV: 77.5 fL — ABNORMAL LOW (ref 78.0–100.0)
Platelets: 238 10*3/uL (ref 150–400)
RBC: 4.89 MIL/uL (ref 4.22–5.81)
RDW: 15.1 % (ref 11.5–15.5)
WBC: 5 10*3/uL (ref 4.0–10.5)

## 2017-11-14 LAB — BASIC METABOLIC PANEL
Anion gap: 8 (ref 5–15)
BUN: 8 mg/dL (ref 6–20)
CO2: 23 mmol/L (ref 22–32)
Calcium: 9.5 mg/dL (ref 8.9–10.3)
Chloride: 111 mmol/L (ref 101–111)
Creatinine, Ser: 0.92 mg/dL (ref 0.61–1.24)
GFR calc Af Amer: >60 mL/min (ref >60)
GFR calc non Af Amer: >60 mL/min (ref >60)
Glucose, Bld: 95 mg/dL (ref 65–99)
Potassium: 3.9 mmol/L (ref 3.5–5.1)
Sodium: 142 mmol/L (ref 135–145)

## 2017-11-14 NOTE — Progress Notes (Addendum)
PCP Larrie Kass  MD  Pt.  Has already stopped ASA  Will be going to coumadin clinic tomorrow and they will determine when he is to stop coumadin,will check PT day of surgery.

## 2017-11-14 NOTE — Pre-Procedure Instructions (Signed)
Alexander English  11/14/2017      [place] ([place]), [place] - Alexander English [address] [place] ([place]) [place] [address] Phone: [phone] Fax: [phone]    Your procedure is scheduled on  Wednesday 11/22/17  Report to [facility] Admitting at 850 A.M.  Call this number if you have problems the morning of surgery:  [phone]   Remember:  No food after midnight.     Take these medicines the morning of surgery with A SIP OF WATER-  TOVIAZ, Alexander English   Follow MD instructions on taking coumadin and Aspirin. If no instructions given call prescribing MD to ask what you should do regarding these medications.  7 days prior to surgery STOP taking any Aspirin(unless otherwise instructed by your surgeon), Aleve, Naproxen, Ibuprofen, Motrin, Advil, Goody's, BC's, all herbal medications, fish oil, and all vitamins    Do not wear jewelry, make-up or nail polish.  Do not wear lotions, powders, or perfumes, or deodorant.  Do not shave 48 hours prior to surgery.  Men may shave face and neck.  Do not bring valuables to the hospital.  [facility] is not responsible for any belongings or valuables.  Contacts, dentures or bridgework may not be worn into surgery.  Leave your suitcase in the car.  After surgery it may be brought to your room.  For patients admitted to the hospital, discharge time will be determined by your treatment team.  Patients discharged the day of surgery will not be allowed to drive home.   Name and phone number of your driver:    Special instructions:  [place] - Preparing for Surgery  Before surgery, you can play an important role.  Because skin is not sterile, your skin needs to be as free of germs as possible.  You can reduce the number of germs on you skin by washing with CHG (chlorahexidine gluconate) soap before surgery.  CHG is an antiseptic cleaner which kills germs and bonds with the skin to continue killing  germs even after washing.  Oral Hygiene is also important in reducing the risk of infection.  Remember to brush your teeth with your regular toothpaste the morning of surgery.  Please DO NOT use if you have an allergy to CHG or antibacterial soaps.  If your skin becomes reddened/irritated stop using the CHG and inform your nurse when you arrive at Short Stay.  Do not shave (including legs and underarms) for at least 48 hours prior to the first CHG shower.  You may shave your face.  Please follow these instructions carefully:   1.  Shower with CHG Soap the night before surgery and the morning of Surgery.  2.  If you choose to wash your hair, wash your hair first as usual with your normal shampoo.  3.  After you shampoo, rinse your hair and body thoroughly to remove the shampoo. 4.  Use CHG as you would any other liquid soap.  You can apply chg directly to the skin and wash gently with a      scrungie or washcloth.           5.  Apply the CHG Soap to your body ONLY FROM THE NECK DOWN.   Do not use on open wounds or open sores. Avoid contact with your eyes, ears, mouth and genitals (private parts).  Wash genitals (private parts) with your normal soap.  6.  Wash thoroughly, paying special attention to  the area where your surgery will be performed.  7.  Thoroughly rinse your body with warm water from the neck down.  8.  DO NOT shower/wash with your normal soap after using and rinsing off the CHG Soap.  9.  Pat yourself dry with a clean towel.            10.  Wear clean pajamas.            11.  Place clean sheets on your bed the night of your first shower and do not sleep with pets.  Day of Surgery  Do not apply any lotions/deoderants the morning of surgery.   Please wear clean clothes to the hospital/surgery center. Remember to brush your teeth with toothpaste.     Please read over the following fact sheets that you were given. Pain Booklet

## 2017-11-15 ENCOUNTER — Ambulatory Visit (INDEPENDENT_AMBULATORY_CARE_PROVIDER_SITE_OTHER): Payer: Medicare HMO | Admitting: Pharmacist

## 2017-11-15 DIAGNOSIS — Z86718 Personal history of other venous thrombosis and embolism: Secondary | ICD-10-CM

## 2017-11-15 DIAGNOSIS — Z5181 Encounter for therapeutic drug level monitoring: Secondary | ICD-10-CM

## 2017-11-15 DIAGNOSIS — Z7901 Long term (current) use of anticoagulants: Secondary | ICD-10-CM | POA: Diagnosis not present

## 2017-11-15 LAB — POCT INR: INR: 2.1 (ref 2.0–3.0)

## 2017-11-15 NOTE — Progress Notes (Signed)
Anticoagulation Management Alexander English is a 62 y.o. male who reports to the clinic for monitoring of warfarin treatment.    Indication: DVT, history of; long term (current) use of anticoagulant. Duration: indefinite Supervising physician: Murriel Hopper  Anticoagulation Clinic Visit History: Patient does not report signs/symptoms of bleeding or thromboembolism  Other recent changes: No diet, medications, lifestyle changes except as noted in patient findings.  Anticoagulation Episode Summary    Current INR goal:   2.0-3.0  TTR:   83.2 % (5.7 y)  Next INR check:   11/15/2017  INR from last check:   2.1 (11/15/2017)  Weekly max warfarin dose:     Target end date:   Indefinite  INR check location:   Anticoagulation Clinic  Preferred lab:     Send INR reminders to:   ANTICOAG IMP   Indications   History of venous thromboembolism [Z86.718]       Comments:           No Known Allergies Prior to Admission medications   Medication Sig Start Date End Date Taking? Authorizing Provider  atorvastatin (LIPITOR) 10 MG tablet Take 1 tablet (10 mg total) daily by mouth. 05/12/17  Yes [name], MD  fesoterodine (TOVIAZ) 4 MG TB24 tablet Take 1 tablet (4 mg total) by mouth daily. 11/06/17  Yes Rice, Resa Miner, MD  mirabegron ER (MYRBETRIQ) 25 MG TB24 tablet Take 25 mg by mouth daily.    Yes [provider]  warfarin (COUMADIN) 5 MG tablet Take 1 tablet (5 mg total) by mouth daily at 6 PM. 11/13/17  Yes [name], MD  zolpidem (AMBIEN) 5 MG tablet Take 1 tablet (5 mg total) by mouth at bedtime as needed for sleep. 10/16/17  Yes [name], MD  aspirin EC 81 MG tablet Take 81 mg by mouth daily.    [provider]  Multiple Vitamin (MULTIVITAMIN) tablet Take 1 tablet by mouth daily.    [provider]  sorbitol 70 % solution Take 15 mLs by mouth daily as needed. Patient not taking: Reported on 11/15/2017 03/31/17   [name], MD   Past  Medical History:  Diagnosis Date  . Aortic atherosclerosis ([place]) 10/23/2017   Asymptomatic, seen on CT scan  . Bilateral cataracts 05/18/2016   Not visually significant  . Chronic constipation 06/12/2013  . Chronic low back pain 06/12/2013   L4-5 facet arthropathy   . Chronic pain of both shoulders 12/07/2013   A/C joint osteoarthritis   . Cluster headaches    Resolved in 2006  . COPD (chronic obstructive pulmonary disease) ([place])   . Dry eye syndrome, bilateral 05/18/2016  . Embolic stroke involving right middle cerebral artery ([place]) 08/08/2004   Occured after traveling from [place] to [place] where he was hospitalized for 1 month with no further work-up or therapy.  Returned to [place] unable to walk and was admitted to [facility] immediately after landing. Presumed embolic per neuro but TEE, bubble study, and hypercoag W/U negative. On indefinite coumadin per patient's informed preference.  Residual effects : Left spastic hemiplegia. Tx with phenol tibi  . History of kidney stones   . Hyperplastic colon polyp 07/27/2012   Excised endoscopically 07/27/2012  . Hypertensive retinopathy of both eyes 05/18/2016  . Left nephrolithiasis 08/31/2015   With mild left hydronephrosis.  Scheduled to undergo shockwave lithotripsy.  . Obesity (BMI 30.0-34.9) 12/07/2013  . Osteoarthritis of both knees 01/26/2012  . Positive PPD 12/07/2013  . Pulmonary embolism ([place]) 09/30/2004  Diagnosed in April 2006 after returning from [place].  Likely provoked as it occurred after a 12 hour plane flight within 2 months of R MCA CVA with left hemiparesis. Patient has made an informed decision to remain on lifelong warfarin.   . Urge incontinence of urine 08/31/2015   Possibly secondary to prior CVA.  Treated with Myrbetriq.   Social History   Socioeconomic History  . Marital status: Divorced    Spouse name: Not on file  . Number of children: Not on file  . Years of education: Not on file  . Highest education level: Not on file   Occupational History  . Not on file  Social Needs  . Financial resource strain: Not on file  . Food insecurity:    Worry: Not on file    Inability: Not on file  . Transportation needs:    Medical: Not on file    Non-medical: Not on file  Tobacco Use  . Smoking status: Former Smoker    Last attempt to quit: 06/28/2007    Years since quitting: 10.3  . Smokeless tobacco: Never Used  Substance and Sexual Activity  . Alcohol use: No    Alcohol/week: 0.0 oz    Comment: Remote past  . Drug use: No  . Sexual activity: Not on file  Lifestyle  . Physical activity:    Days per week: Not on file    Minutes per session: Not on file  . Stress: Not on file  Relationships  . Social connections:    Talks on phone: Not on file    Gets together: Not on file    Attends religious service: Not on file    Active member of club or organization: Not on file    Attends meetings of clubs or organizations: Not on file    Relationship status: Not on file  Other Topics Concern  . Not on file  Social History Narrative   Born in [place] but has been in the [place] for > 30 years.  Divorced, 1 daughter and 2 sons.  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   Family History  Problem Relation Age of Onset  . Stroke Maternal Grandmother   . Unexplained death Mother   . Depression Mother        After husband's death  . Unexplained death Father   . Osteoarthritis Father        Knees  . Early death Sister   . Seizures Sister   . Early death Brother 55       Unknown cause  . Healthy Daughter   . Healthy Son   . Stroke Maternal Aunt   . Healthy Sister   . Healthy Sister   . Unexplained death Brother   . Healthy Brother   . Healthy Son     ASSESSMENT Recent Results: The most recent result is correlated with 35 mg per week: Lab Results  Component Value Date   INR 2.1 11/15/2017   INR 2.10 11/06/2017   INR 2.50 10/23/2017    Anticoagulation  Dosing: Description   Take one (1) tablet of your 5mg  peach-colored warfarin tablets by mouth, once-daily at [place] on Friday, May 24th. 2019. After this dose, you will OMIT WARFARIN commencing on Saturday May 25th and OMITTING [facility] DAY'S DOSE (Saturday, Sunday, Monday, Tuesday, Wednesday [day of surgery]). Warfarin will be re-commenced after surgery when advised by your surgeon.  INR today: Therapeutic  PLAN Weekly dose was unchanged until his "HOLD" BEGINS on Saturday 25-MAY-19 for five days.   Patient Instructions  Patient instructed to take medications as defined in the Anti-coagulation Track section of this encounter.  Patient instructed to take today's dose.  Patient instructed to take one (1) tablet of your 5mg  peach-colored warfarin tablets by mouth, once-daily at [place] on Friday, May 24th. 2019. After this dose, you will OMIT WARFARIN commencing on Saturday May 25th and OMITTING [facility] DAY'S DOSE (Saturday, Sunday, Monday, Tuesday, Wednesday [day of surgery]). Warfarin will be re-commenced after surgery when advised by your surgeon. Patient verbalized understanding of these instructions.     Patient advised to contact clinic or seek medical attention if signs/symptoms of bleeding or thromboembolism occur.  Patient verbalized understanding by repeating back information and was advised to contact me if further medication-related questions arise. Patient was also provided an information handout.  Follow-up Return in 6 days (on 11/21/2017) for Follow up INR at 0930.  [name], PharmD, CACP, CPP  15 minutes spent face-to-face with the patient during the encounter. 50% of time spent on education. 50% of time was spent on fingerstick point of care INR sample collection, processing, results determination, dose adjustment and documentation around planned surgical event on 29-MAY-19 and documentation in [email or web address].

## 2017-11-15 NOTE — Progress Notes (Signed)
Reviewed thx DrG 

## 2017-11-15 NOTE — Patient Instructions (Signed)
Patient instructed to take medications as defined in the Anti-coagulation Track section of this encounter.  Patient instructed to take today's dose.  Patient instructed to take one (1) tablet of your 5mg  peach-colored warfarin tablets by mouth, once-daily at [place] on Friday, May 24th. 2019. After this dose, you will OMIT WARFARIN commencing on Saturday May 25th and OMITTING [facility] DAY'S DOSE (Saturday, Sunday, Monday, Tuesday, Wednesday [day of surgery]). Warfarin will be re-commenced after surgery when advised by your surgeon. Patient verbalized understanding of these instructions.

## 2017-11-21 ENCOUNTER — Ambulatory Visit (INDEPENDENT_AMBULATORY_CARE_PROVIDER_SITE_OTHER): Payer: Medicare HMO | Admitting: Pharmacist

## 2017-11-21 DIAGNOSIS — Z86718 Personal history of other venous thrombosis and embolism: Secondary | ICD-10-CM

## 2017-11-21 DIAGNOSIS — Z7901 Long term (current) use of anticoagulants: Secondary | ICD-10-CM

## 2017-11-21 LAB — POCT INR: INR: 1.4 — AB (ref 2.0–3.0)

## 2017-11-21 NOTE — Progress Notes (Signed)
Anticoagulation Management Alexander English is a 62 y.o. male who reports to the clinic for monitoring of warfarin treatment.    Indication: DVT , history of; long term use of anticoagulant.   Duration: indefinite Supervising physician: [place] [facility] Visit History: Patient does not report signs/symptoms of bleeding or thromboembolism  Other recent changes: No diet, medications, lifestyle changes except as noted in patient findings.  Anticoagulation Episode Summary    Current INR goal:   2.0-3.0  TTR:   83.0 % (5.7 y)  Next INR check:   11/27/2017  INR from last check:   1.4! (11/21/2017)  Weekly max warfarin dose:     Target end date:   Indefinite  INR check location:   Anticoagulation Clinic  Preferred lab:     Send INR reminders to:   ANTICOAG IMP   Indications   History of venous thromboembolism [Z86.718]       Comments:           No Known Allergies Prior to Admission medications   Medication Sig Start Date End Date Taking? Authorizing Provider  atorvastatin (LIPITOR) 10 MG tablet Take 1 tablet (10 mg total) daily by mouth. 05/12/17  Yes [name], MD  fesoterodine (TOVIAZ) 4 MG TB24 tablet Take 1 tablet (4 mg total) by mouth daily. 11/06/17  Yes Rice, Resa Miner, MD  mirabegron ER (MYRBETRIQ) 25 MG TB24 tablet Take 25 mg by mouth daily.    Yes [provider]  Multiple Vitamin (MULTIVITAMIN) tablet Take 1 tablet by mouth daily.   Yes [provider]  sorbitol 70 % solution Take 15 mLs by mouth daily as needed. 03/31/17  Yes [name], MD  zolpidem (AMBIEN) 5 MG tablet Take 1 tablet (5 mg total) by mouth at bedtime as needed for sleep. 10/16/17  Yes [name], MD  aspirin EC 81 MG tablet Take 81 mg by mouth daily.    [provider]  warfarin (COUMADIN) 5 MG tablet Take 1 tablet (5 mg total) by mouth daily at 6 PM. Patient not taking: Reported on 11/21/2017 11/13/17   [name], MD   Past  Medical History:  Diagnosis Date  . Aortic atherosclerosis ([place]) 10/23/2017   Asymptomatic, seen on CT scan  . Bilateral cataracts 05/18/2016   Not visually significant  . Chronic constipation 06/12/2013  . Chronic low back pain 06/12/2013   L4-5 facet arthropathy   . Chronic pain of both shoulders 12/07/2013   A/C joint osteoarthritis   . Cluster headaches    Resolved in 2006  . COPD (chronic obstructive pulmonary disease) ([place])   . Dry eye syndrome, bilateral 05/18/2016  . Embolic stroke involving right middle cerebral artery ([place]) 08/08/2004   Occured after traveling from [place] to [place] where he was hospitalized for 1 month with no further work-up or therapy.  Returned to [place] unable to walk and was admitted to [facility] immediately after landing. Presumed embolic per neuro but TEE, bubble study, and hypercoag W/U negative. On indefinite coumadin per patient's informed preference.  Residual effects : Left spastic hemiplegia. Tx with phenol tibi  . History of kidney stones   . Hyperplastic colon polyp 07/27/2012   Excised endoscopically 07/27/2012  . Hypertensive retinopathy of both eyes 05/18/2016  . Left nephrolithiasis 08/31/2015   With mild left hydronephrosis.  Scheduled to undergo shockwave lithotripsy.  . Obesity (BMI 30.0-34.9) 12/07/2013  . Osteoarthritis of both knees 01/26/2012  . Positive PPD 12/07/2013  . Pulmonary embolism ([place]) 09/30/2004  Diagnosed in April 2006 after returning from [place].  Likely provoked as it occurred after a 12 hour plane flight within 2 months of R MCA CVA with left hemiparesis. Patient has made an informed decision to remain on lifelong warfarin.   . Urge incontinence of urine 08/31/2015   Possibly secondary to prior CVA.  Treated with Myrbetriq.   Social History   Socioeconomic History  . Marital status: Divorced    Spouse name: Not on file  . Number of children: Not on file  . Years of education: Not on file  . Highest education level: Not on file   Occupational History  . Not on file  Social Needs  . Financial resource strain: Not on file  . Food insecurity:    Worry: Not on file    Inability: Not on file  . Transportation needs:    Medical: Not on file    Non-medical: Not on file  Tobacco Use  . Smoking status: Former Smoker    Last attempt to quit: 06/28/2007    Years since quitting: 10.4  . Smokeless tobacco: Never Used  Substance and Sexual Activity  . Alcohol use: No    Alcohol/week: 0.0 oz    Comment: Remote past  . Drug use: No  . Sexual activity: Not on file  Lifestyle  . Physical activity:    Days per week: Not on file    Minutes per session: Not on file  . Stress: Not on file  Relationships  . Social connections:    Talks on phone: Not on file    Gets together: Not on file    Attends religious service: Not on file    Active member of club or organization: Not on file    Attends meetings of clubs or organizations: Not on file    Relationship status: Not on file  Other Topics Concern  . Not on file  Social History Narrative   Born in [place] but has been in the [place] for > 30 years.  Divorced, 1 daughter and 2 sons.  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   Family History  Problem Relation Age of Onset  . Stroke Maternal Grandmother   . Unexplained death Mother   . Depression Mother        After husband's death  . Unexplained death Father   . Osteoarthritis Father        Knees  . Early death Sister   . Seizures Sister   . Early death Brother 58       Unknown cause  . Healthy Daughter   . Healthy Son   . Stroke Maternal Aunt   . Healthy Sister   . Healthy Sister   . Unexplained death Brother   . Healthy Brother   . Healthy Son     ASSESSMENT Recent Results: The most recent result is correlated with having been OFF of warfarin for 5 days prior to planned ENT surgery on Wednesday 29-MAY-19. Lab Results  Component Value Date   INR 1.4 (A)  11/21/2017   INR 2.1 11/15/2017   INR 2.10 11/06/2017    Anticoagulation Dosing: Description   Warfarin will be re-commenced after surgery when advised by your surgeon.       INR today: Subtherapeutic  PLAN Weekly dose was deferred at this time.  Patient Instructions  Patient instructed to take medications as defined in the Anti-coagulation Track section of this encounter.  Patient instructed to OMIT today's dose. OMIT dose of warfarin also on Wednesday 29-JUN-19. Patient instructed that his provider at time of discharge from his planned surgical event will resume warfarin as situation dictates with respect to potential for bleeding, etc.  Patient verbalized understanding of these instructions.     Patient advised to contact clinic or seek medical attention if signs/symptoms of bleeding or thromboembolism occur.  Patient verbalized understanding by repeating back information and was advised to contact me if further medication-related questions arise. Patient was also provided an information handout.  Follow-up Return in 6 days (on 11/27/2017) for Follow up INR at 0900h.  [name], PharmD, CACP, CPP  15 minutes spent face-to-face with the patient during the encounter. 50% of time spent on education. 50% of time was spent on fingerstick point of care INR sample collection, processing, results determination and documentation in [email or web address].

## 2017-11-21 NOTE — Patient Instructions (Signed)
Patient instructed to take medications as defined in the Anti-coagulation Track section of this encounter.  Patient instructed to OMIT today's dose. OMIT dose of warfarin also on Wednesday 29-JUN-19. Patient instructed that his provider at time of discharge from his planned surgical event will resume warfarin as situation dictates with respect to potential for bleeding, etc.  Patient verbalized understanding of these instructions.

## 2017-11-22 ENCOUNTER — Inpatient Hospital Stay (HOSPITAL_COMMUNITY): Payer: Medicare HMO

## 2017-11-22 ENCOUNTER — Inpatient Hospital Stay (HOSPITAL_COMMUNITY)
Admission: RE | Admit: 2017-11-22 | Discharge: 2017-11-24 | DRG: 132 | Disposition: A | Discharge status: Home / Self Care | Payer: Medicare HMO | Attending: Otolaryngology | Admitting: Otolaryngology

## 2017-11-22 ENCOUNTER — Encounter (HOSPITAL_COMMUNITY)
Admission: RE | Disposition: A | Discharge status: Home / Self Care | Payer: Self-pay | Source: Home / Self Care | Attending: Otolaryngology

## 2017-11-22 ENCOUNTER — Encounter (HOSPITAL_COMMUNITY): Payer: Self-pay | Admitting: Surgery

## 2017-11-22 ENCOUNTER — Other Ambulatory Visit: Payer: Self-pay

## 2017-11-22 DIAGNOSIS — Z8673 Personal history of transient ischemic attack (TIA), and cerebral infarction without residual deficits: Secondary | ICD-10-CM | POA: Diagnosis not present

## 2017-11-22 DIAGNOSIS — E785 Hyperlipidemia, unspecified: Secondary | ICD-10-CM | POA: Diagnosis not present

## 2017-11-22 DIAGNOSIS — K5909 Other constipation: Secondary | ICD-10-CM | POA: Diagnosis not present

## 2017-11-22 DIAGNOSIS — Z87891 Personal history of nicotine dependence: Secondary | ICD-10-CM | POA: Diagnosis not present

## 2017-11-22 DIAGNOSIS — M170 Bilateral primary osteoarthritis of knee: Secondary | ICD-10-CM | POA: Diagnosis not present

## 2017-11-22 DIAGNOSIS — J449 Chronic obstructive pulmonary disease, unspecified: Secondary | ICD-10-CM | POA: Diagnosis not present

## 2017-11-22 DIAGNOSIS — Z86711 Personal history of pulmonary embolism: Secondary | ICD-10-CM | POA: Diagnosis not present

## 2017-11-22 DIAGNOSIS — M272 Inflammatory conditions of jaws: Secondary | ICD-10-CM | POA: Diagnosis not present

## 2017-11-22 DIAGNOSIS — C310 Malignant neoplasm of maxillary sinus: Secondary | ICD-10-CM | POA: Diagnosis not present

## 2017-11-22 DIAGNOSIS — J320 Chronic maxillary sinusitis: Secondary | ICD-10-CM | POA: Diagnosis not present

## 2017-11-22 DIAGNOSIS — H0510 Unspecified chronic inflammatory disorders of orbit: Secondary | ICD-10-CM | POA: Diagnosis not present

## 2017-11-22 HISTORY — PX: SKIN SPLIT GRAFT: SHX444

## 2017-11-22 HISTORY — PX: MAXILLECTOMY: SUR858

## 2017-11-22 HISTORY — PX: MAXILLECTOMY: SHX5207

## 2017-11-22 LAB — CBC
HCT: 38.8 % — ABNORMAL LOW (ref 39.0–52.0)
Hemoglobin: 11.7 g/dL — ABNORMAL LOW (ref 13.0–17.0)
MCH: 22.9 pg — ABNORMAL LOW (ref 26.0–34.0)
MCHC: 30.2 g/dL (ref 30.0–36.0)
MCV: 76.1 fL — ABNORMAL LOW (ref 78.0–100.0)
Platelets: 256 10*3/uL (ref 150–400)
RBC: 5.1 MIL/uL (ref 4.22–5.81)
RDW: 15.2 % (ref 11.5–15.5)
WBC: 9.6 10*3/uL (ref 4.0–10.5)

## 2017-11-22 LAB — POCT I-STAT EG7
Acid-base deficit: 1 mmol/L (ref 0.0–2.0)
Bicarbonate: 25.2 mmol/L (ref 20.0–28.0)
Calcium, Ion: 1.21 mmol/L (ref 1.15–1.40)
HCT: 40 % (ref 39.0–52.0)
Hemoglobin: 13.6 g/dL (ref 13.0–17.0)
O2 Saturation: 98 %
Potassium: 4 mmol/L (ref 3.5–5.1)
Sodium: 142 mmol/L (ref 135–145)
TCO2: 27 mmol/L (ref 22–32)
pCO2, Ven: 47.9 mmHg (ref 44.0–60.0)
pH, Ven: 7.33 (ref 7.250–7.430)
pO2, Ven: 107 mmHg — ABNORMAL HIGH (ref 32.0–45.0)

## 2017-11-22 LAB — TYPE AND SCREEN
ABO/RH(D): B POS
Antibody Screen: NEGATIVE

## 2017-11-22 LAB — PROTIME-INR
INR: 1.26
Prothrombin Time: 15.7 seconds — ABNORMAL HIGH (ref 11.4–15.2)

## 2017-11-22 LAB — ABO/RH: ABO/RH(D): B POS

## 2017-11-22 SURGERY — MAXILLECTOMY
Anesthesia: General | Site: Mouth | Laterality: Left

## 2017-11-22 MED ORDER — ONDANSETRON HCL 4 MG/2ML IJ SOLN
INTRAMUSCULAR | Status: AC
  Filled 2017-11-22: qty 2

## 2017-11-22 MED ORDER — HYDROCODONE-ACETAMINOPHEN 7.5-325 MG/15ML PO SOLN
10.0000 mL | ORAL | Status: DC | PRN
  Administered 2017-11-22 – 2017-11-23 (×3): 15 mL via ORAL
  Administered 2017-11-23 – 2017-11-24 (×2): 10 mL via ORAL
  Administered 2017-11-24: 15 mL via ORAL
  Filled 2017-11-22 (×7): qty 15

## 2017-11-22 MED ORDER — MORPHINE SULFATE (PF) 2 MG/ML IV SOLN: 2.0000 mg | INTRAVENOUS | Status: DC | PRN

## 2017-11-22 MED ORDER — OXYCODONE HCL 5 MG/5ML PO SOLN: 5.0000 mg | Freq: Once | ORAL | Status: DC | PRN

## 2017-11-22 MED ORDER — ROCURONIUM BROMIDE 10 MG/ML (PF) SYRINGE
PREFILLED_SYRINGE | INTRAVENOUS | Status: DC | PRN
  Administered 2017-11-22: 30 mg via INTRAVENOUS
  Administered 2017-11-22: 20 mg via INTRAVENOUS
  Administered 2017-11-22: 50 mg via INTRAVENOUS

## 2017-11-22 MED ORDER — FENTANYL CITRATE (PF) 100 MCG/2ML IJ SOLN: 25.0000 ug | INTRAMUSCULAR | Status: DC | PRN

## 2017-11-22 MED ORDER — LIDOCAINE-EPINEPHRINE 1 %-1:100000 IJ SOLN
INTRAMUSCULAR | Status: DC | PRN
  Administered 2017-11-22: 10 mL

## 2017-11-22 MED ORDER — PROPOFOL 10 MG/ML IV BOLUS
INTRAVENOUS | Status: AC
  Filled 2017-11-22: qty 20

## 2017-11-22 MED ORDER — FENTANYL CITRATE (PF) 250 MCG/5ML IJ SOLN
INTRAMUSCULAR | Status: AC
  Filled 2017-11-22: qty 5

## 2017-11-22 MED ORDER — ADULT MULTIVITAMIN W/MINERALS CH
1.0000 | ORAL_TABLET | Freq: Every day | ORAL | Status: DC
  Administered 2017-11-23 – 2017-11-24 (×2): 1 via ORAL
  Filled 2017-11-22 (×6): qty 1

## 2017-11-22 MED ORDER — ASPIRIN EC 81 MG PO TBEC
81.0000 mg | DELAYED_RELEASE_TABLET | Freq: Every day | ORAL | Status: DC
  Administered 2017-11-23 – 2017-11-24 (×2): 81 mg via ORAL
  Filled 2017-11-22 (×2): qty 1

## 2017-11-22 MED ORDER — BACITRACIN ZINC 500 UNIT/GM EX OINT
TOPICAL_OINTMENT | CUTANEOUS | Status: DC | PRN
  Administered 2017-11-22: 1 via TOPICAL

## 2017-11-22 MED ORDER — EPINEPHRINE HCL (NASAL) 0.1 % NA SOLN
NASAL | Status: DC | PRN
  Administered 2017-11-22: 30 mL via TOPICAL

## 2017-11-22 MED ORDER — PROMETHAZINE HCL 25 MG RE SUPP
25.0000 mg | Freq: Four times a day (QID) | RECTAL | Status: DC | PRN
  Filled 2017-11-22: qty 1

## 2017-11-22 MED ORDER — DEXAMETHASONE SODIUM PHOSPHATE 10 MG/ML IJ SOLN
INTRAMUSCULAR | Status: AC
  Filled 2017-11-22: qty 1

## 2017-11-22 MED ORDER — PHENYLEPHRINE 40 MCG/ML (10ML) SYRINGE FOR IV PUSH (FOR BLOOD PRESSURE SUPPORT)
PREFILLED_SYRINGE | INTRAVENOUS | Status: AC
  Filled 2017-11-22: qty 10

## 2017-11-22 MED ORDER — ZOLPIDEM TARTRATE 5 MG PO TABS: 5.0000 mg | ORAL_TABLET | Freq: Every evening | ORAL | Status: DC | PRN

## 2017-11-22 MED ORDER — CLINDAMYCIN PHOSPHATE 600 MG/50ML IV SOLN
600.0000 mg | Freq: Three times a day (TID) | INTRAVENOUS | Status: DC
  Administered 2017-11-22 – 2017-11-24 (×6): 600 mg via INTRAVENOUS
  Filled 2017-11-22 (×8): qty 50

## 2017-11-22 MED ORDER — EPINEPHRINE HCL (NASAL) 0.1 % NA SOLN
NASAL | Status: AC
  Filled 2017-11-22: qty 30

## 2017-11-22 MED ORDER — CLINDAMYCIN PHOSPHATE 900 MG/50ML IV SOLN
900.0000 mg | INTRAVENOUS | Status: AC
  Administered 2017-11-22: 900 mg via INTRAVENOUS
  Filled 2017-11-22: qty 50

## 2017-11-22 MED ORDER — FENTANYL CITRATE (PF) 250 MCG/5ML IJ SOLN
INTRAMUSCULAR | Status: DC | PRN
  Administered 2017-11-22 (×3): 50 ug via INTRAVENOUS
  Administered 2017-11-22: 200 ug via INTRAVENOUS
  Administered 2017-11-22: 100 ug via INTRAVENOUS
  Administered 2017-11-22: 50 ug via INTRAVENOUS

## 2017-11-22 MED ORDER — OXYMETAZOLINE HCL 0.05 % NA SOLN
2.0000 | NASAL | Status: DC
  Administered 2017-11-22: 2 via NASAL
  Filled 2017-11-22 (×2): qty 15

## 2017-11-22 MED ORDER — MIDAZOLAM HCL 2 MG/2ML IJ SOLN
INTRAMUSCULAR | Status: AC
  Filled 2017-11-22: qty 2

## 2017-11-22 MED ORDER — LACTATED RINGERS IV SOLN
INTRAVENOUS | Status: DC
  Administered 2017-11-22 – 2017-11-23 (×3): via INTRAVENOUS
  Filled 2017-11-22: qty 1000

## 2017-11-22 MED ORDER — OXYCODONE HCL 5 MG PO TABS: 5.0000 mg | ORAL_TABLET | Freq: Once | ORAL | Status: DC | PRN

## 2017-11-22 MED ORDER — SUCCINYLCHOLINE CHLORIDE 200 MG/10ML IV SOSY
PREFILLED_SYRINGE | INTRAVENOUS | Status: AC
  Filled 2017-11-22: qty 10

## 2017-11-22 MED ORDER — BACITRACIN ZINC 500 UNIT/GM EX OINT
1.0000 "application " | TOPICAL_OINTMENT | Freq: Three times a day (TID) | CUTANEOUS | Status: DC
  Administered 2017-11-22 – 2017-11-24 (×7): 1 via TOPICAL
  Filled 2017-11-22: qty 28.35

## 2017-11-22 MED ORDER — SORBITOL 70 % SOLN
15.0000 mL | Freq: Every day | Status: DC | PRN
  Filled 2017-11-22: qty 30

## 2017-11-22 MED ORDER — SUGAMMADEX SODIUM 500 MG/5ML IV SOLN
INTRAVENOUS | Status: AC
  Filled 2017-11-22: qty 10

## 2017-11-22 MED ORDER — POTASSIUM CHLORIDE 2 MEQ/ML IV SOLN
INTRAVENOUS | Status: DC
  Administered 2017-11-22 – 2017-11-24 (×3): via INTRAVENOUS
  Filled 2017-11-22 (×8): qty 1000

## 2017-11-22 MED ORDER — SUGAMMADEX SODIUM 500 MG/5ML IV SOLN
INTRAVENOUS | Status: DC | PRN
  Administered 2017-11-22: 300 mg via INTRAVENOUS

## 2017-11-22 MED ORDER — LIDOCAINE 2% (20 MG/ML) 5 ML SYRINGE
INTRAMUSCULAR | Status: DC | PRN
  Administered 2017-11-22: 100 mg via INTRAVENOUS

## 2017-11-22 MED ORDER — ATORVASTATIN CALCIUM 10 MG PO TABS
10.0000 mg | ORAL_TABLET | Freq: Every day | ORAL | Status: DC
  Administered 2017-11-23 – 2017-11-24 (×2): 10 mg via ORAL
  Filled 2017-11-22 (×2): qty 1

## 2017-11-22 MED ORDER — FESOTERODINE FUMARATE ER 4 MG PO TB24
4.0000 mg | ORAL_TABLET | Freq: Every day | ORAL | Status: DC
  Administered 2017-11-23 – 2017-11-24 (×2): 4 mg via ORAL
  Filled 2017-11-22 (×3): qty 1

## 2017-11-22 MED ORDER — METOPROLOL TARTRATE 5 MG/5ML IV SOLN
INTRAVENOUS | Status: DC | PRN
  Administered 2017-11-22: 2.5 mg via INTRAVENOUS

## 2017-11-22 MED ORDER — MIRABEGRON ER 25 MG PO TB24
25.0000 mg | ORAL_TABLET | Freq: Every day | ORAL | Status: DC
  Administered 2017-11-23 – 2017-11-24 (×2): 25 mg via ORAL
  Filled 2017-11-22 (×2): qty 1

## 2017-11-22 MED ORDER — ROCURONIUM BROMIDE 10 MG/ML (PF) SYRINGE
PREFILLED_SYRINGE | INTRAVENOUS | Status: AC
  Filled 2017-11-22: qty 5

## 2017-11-22 MED ORDER — DEXMEDETOMIDINE HCL IN NACL 200 MCG/50ML IV SOLN
INTRAVENOUS | Status: DC | PRN
  Administered 2017-11-22 (×3): 4 ug via INTRAVENOUS
  Administered 2017-11-22: 8 ug via INTRAVENOUS

## 2017-11-22 MED ORDER — DEXAMETHASONE SODIUM PHOSPHATE 10 MG/ML IJ SOLN
INTRAMUSCULAR | Status: DC | PRN
  Administered 2017-11-22: 10 mg via INTRAVENOUS

## 2017-11-22 MED ORDER — KETAMINE HCL 10 MG/ML IJ SOLN
INTRAMUSCULAR | Status: AC
  Filled 2017-11-22: qty 1

## 2017-11-22 MED ORDER — SORBITOL 70 % PO SOLN
15.0000 mL | Freq: Every day | ORAL | Status: DC | PRN
Start: 2017-11-22 — End: 2017-11-22
  Filled 2017-11-22 (×2): qty 15

## 2017-11-22 MED ORDER — BACITRACIN ZINC 500 UNIT/GM EX OINT
TOPICAL_OINTMENT | CUTANEOUS | Status: AC
  Filled 2017-11-22: qty 28.35

## 2017-11-22 MED ORDER — PROPOFOL 10 MG/ML IV BOLUS
INTRAVENOUS | Status: DC | PRN
  Administered 2017-11-22: 200 mg via INTRAVENOUS

## 2017-11-22 MED ORDER — 0.9 % SODIUM CHLORIDE (POUR BTL) OPTIME
TOPICAL | Status: DC | PRN
  Administered 2017-11-22: 1000 mL

## 2017-11-22 MED ORDER — MIDAZOLAM HCL 2 MG/2ML IJ SOLN
INTRAMUSCULAR | Status: DC | PRN
  Administered 2017-11-22: 2 mg via INTRAVENOUS

## 2017-11-22 MED ORDER — LIDOCAINE-EPINEPHRINE 1 %-1:100000 IJ SOLN
INTRAMUSCULAR | Status: AC
  Filled 2017-11-22: qty 1

## 2017-11-22 MED ORDER — KETAMINE HCL 100 MG/ML IJ SOLN
INTRAMUSCULAR | Status: DC | PRN
  Administered 2017-11-22: 50 mg via INTRAVENOUS
  Administered 2017-11-22: 10 mg via INTRAVENOUS

## 2017-11-22 MED ORDER — MINERAL OIL LIGHT 100 % EX OIL
TOPICAL_OIL | CUTANEOUS | Status: AC
  Filled 2017-11-22: qty 25

## 2017-11-22 MED ORDER — PROMETHAZINE HCL 25 MG PO TABS: 25.0000 mg | ORAL_TABLET | Freq: Four times a day (QID) | ORAL | Status: DC | PRN

## 2017-11-22 SURGICAL SUPPLY — 70 items
BALL CTTN LRG ABS STRL LF (GAUZE/BANDAGES/DRESSINGS)
BLADE DERMATOME SS (BLADE) ×1 IMPLANT
BLADE DERMATONE (BLADE) ×2 IMPLANT
BLADE RAD40 ROTATE 4M 4 5PK (BLADE) IMPLANT
BLADE RAD60 ROTATE M4 4 5PK (BLADE) IMPLANT
BLADE SURG 15 STRL LF DISP TIS (BLADE) IMPLANT
BLADE SURG 15 STRL SS (BLADE)
BLADE TRICUT ROTATE M4 4 5PK (BLADE) ×2 IMPLANT
BNDG GAUZE ELAST 4 BULKY (GAUZE/BANDAGES/DRESSINGS) ×1 IMPLANT
CANISTER SUCT 3000ML PPV (MISCELLANEOUS) ×3 IMPLANT
CLEANER TIP ELECTROSURG 2X2 (MISCELLANEOUS) ×1 IMPLANT
COTTONBALL LRG STERILE PKG (GAUZE/BANDAGES/DRESSINGS) ×2 IMPLANT
COVER SURGICAL LIGHT HANDLE (MISCELLANEOUS) ×3 IMPLANT
DERMACARRIERS GRAFT 1 TO 1.5 (DISPOSABLE) ×3
DRAPE HALF SHEET 40X57 (DRAPES) IMPLANT
DRAPE MICROSCOPE LEICA 54X105 (DRAPE) ×2 IMPLANT
DRESSING NASAL KENNEDY 3.5X.9 (MISCELLANEOUS) IMPLANT
DRSG NASAL KENNEDY 3.5X.9 (MISCELLANEOUS)
DRSG NASOPORE 8CM (GAUZE/BANDAGES/DRESSINGS) IMPLANT
ELECT COATED BLADE 2.86 ST (ELECTRODE) ×3 IMPLANT
ELECT REM PT RETURN 9FT ADLT (ELECTROSURGICAL) ×3
ELECTRODE REM PT RTRN 9FT ADLT (ELECTROSURGICAL) IMPLANT
FILTER ARTHROSCOPY CONVERTOR (FILTER) IMPLANT
FORCEPS BIPOLAR SPETZLER 8 1.0 (NEUROSURGERY SUPPLIES) ×1 IMPLANT
GAUZE PACKING IODOFORM 1X5 (MISCELLANEOUS) ×1 IMPLANT
GAUZE SPONGE 4X4 16PLY XRAY LF (GAUZE/BANDAGES/DRESSINGS) ×3 IMPLANT
GAUZE XEROFORM 5X9 LF (GAUZE/BANDAGES/DRESSINGS) ×2 IMPLANT
GLOVE BIO SURGEON STRL SZ 6.5 (GLOVE) ×1 IMPLANT
GLOVE BIO SURGEON STRL SZ7.5 (GLOVE) ×1 IMPLANT
GLOVE BIO SURGEON STRL SZ8 (GLOVE) ×1 IMPLANT
GLOVE BIOGEL PI IND STRL 7.5 (GLOVE) IMPLANT
GLOVE BIOGEL PI IND STRL 8 (GLOVE) IMPLANT
GLOVE BIOGEL PI INDICATOR 7.5 (GLOVE) ×1
GLOVE BIOGEL PI INDICATOR 8 (GLOVE) ×1
GLOVE ECLIPSE 7.5 STRL STRAW (GLOVE) ×3 IMPLANT
GOWN BRE IMP SLV AUR LG STRL (GOWN DISPOSABLE) ×4 IMPLANT
GOWN STRL REUS W/ TWL LRG LVL3 (GOWN DISPOSABLE) ×4 IMPLANT
GOWN STRL REUS W/TWL LRG LVL3 (GOWN DISPOSABLE) ×9
GRAFT DERMACARRIERS 1 TO 1.5 (DISPOSABLE) IMPLANT
HEMOSTAT SURGICEL 4X8 (HEMOSTASIS) ×1 IMPLANT
KIT BASIN OR (CUSTOM PROCEDURE TRAY) ×3 IMPLANT
KIT TURNOVER KIT B (KITS) ×3 IMPLANT
NDL PRECISIONGLIDE 27X1.5 (NEEDLE) ×2 IMPLANT
NEEDLE PRECISIONGLIDE 27X1.5 (NEEDLE) ×3 IMPLANT
NS IRRIG 1000ML POUR BTL (IV SOLUTION) ×3 IMPLANT
PAD ABD 8X10 STRL (GAUZE/BANDAGES/DRESSINGS) ×1 IMPLANT
PAD ARMBOARD 7.5X6 YLW CONV (MISCELLANEOUS) ×5 IMPLANT
PATTIES SURGICAL .5 X3 (DISPOSABLE) ×2 IMPLANT
PENCIL FOOT CONTROL (ELECTRODE) ×3 IMPLANT
SHEATH ENDOSCRUB 0 DEG (SHEATH) IMPLANT
SHEATH ENDOSCRUB 30 DEG (SHEATH) IMPLANT
SPECIMEN JAR SMALL (MISCELLANEOUS) ×2 IMPLANT
STAPLER VISISTAT 35W (STAPLE) ×1 IMPLANT
SUT CHROMIC 3 0 PS 2 (SUTURE) ×4 IMPLANT
SUT CHROMIC 4 0 PS 2 18 (SUTURE) IMPLANT
SUT CHROMIC 5 0 P 3 (SUTURE) ×4 IMPLANT
SUT ETHILON 5 0 PS 2 18 (SUTURE) ×1 IMPLANT
SUT SILK 3 0 SH CR/8 (SUTURE) ×1 IMPLANT
SUT SILK 4 0 (SUTURE) ×3
SUT SILK 4-0 18XBRD TIE 12 (SUTURE) IMPLANT
SWAB COLLECTION DEVICE MRSA (MISCELLANEOUS) IMPLANT
SWAB CULTURE ESWAB REG 1ML (MISCELLANEOUS) IMPLANT
SYR 50ML SLIP (SYRINGE) IMPLANT
SYR BULB 3OZ (MISCELLANEOUS) ×3 IMPLANT
SYR CONTROL 10ML LL (SYRINGE) ×3 IMPLANT
TOWEL OR 17X24 6PK STRL BLUE (TOWEL DISPOSABLE) ×3 IMPLANT
TRAY ENT MC OR (CUSTOM PROCEDURE TRAY) ×3 IMPLANT
TUBE CONNECTING 12X1/4 (SUCTIONS) ×3 IMPLANT
TUBING EXTENTION W/L.L. (IV SETS) IMPLANT
WATER STERILE IRR 1000ML POUR (IV SOLUTION) ×2 IMPLANT

## 2017-11-22 NOTE — Interval H&P Note (Signed)
History and Physical Interval Note:  11/22/2017 10:53 AM  Alexander English  has presented today for surgery, with the diagnosis of Squamous cell carcinoma maxillary sinuss  The various methods of treatment have been discussed with the patient and family. After consideration of risks, benefits and other options for treatment, the patient has consented to  Procedure(s): MAXILLECTOMY (Left) SKIN GRAFT SPLIT THICKNESS (N/A) as a surgical intervention .  The patient's history has been reviewed, patient examined, no change in status, stable for surgery.  I have reviewed the patient's chart and labs.  Questions were answered to the patient's satisfaction.     Alexander English

## 2017-11-22 NOTE — Transfer of Care (Signed)
Immediate Anesthesia Transfer of Care Note  Patient: Alexander English  Procedure(s) Performed: MAXILLECTOMY (Left Mouth) SKIN GRAFT SPLIT THICKNESS (Left Leg Upper)  Patient Location: PACU  Anesthesia Type:General  Level of Consciousness: awake and alert   Airway & Oxygen Therapy: Patient Spontanous Breathing and Patient connected to face mask oxygen  Post-op Assessment: Report given to RN and Post -op Vital signs reviewed and stable  Post vital signs: Reviewed and stable  Last Vitals:  Vitals Value Taken Time  BP 149/84 11/22/2017  4:29 PM  Temp    Pulse 83 11/22/2017  4:33 PM  Resp 12 11/22/2017  4:33 PM  SpO2 95 % 11/22/2017  4:33 PM  Vitals shown include unvalidated device data.  Last Pain:  Vitals:   11/22/17 1558  TempSrc:   PainSc: 0-No pain      Patients Stated Pain Goal: 2 (23/53/61 4431)  Complications: No apparent anesthesia complications

## 2017-11-22 NOTE — Anesthesia Preprocedure Evaluation (Signed)
Anesthesia Evaluation  Patient identified by MRN, date of birth, ID band Patient awake    Reviewed: Allergy & Precautions, NPO status , Patient's Chart, lab work & pertinent test results  History of Anesthesia Complications Negative for: history of anesthetic complications  Airway Mallampati: IV  TM Distance: >3 FB Neck ROM: Full  Mouth opening: Limited Mouth Opening  Dental  (+) Teeth Intact   Pulmonary COPD, former smoker,    breath sounds clear to auscultation       Cardiovascular (-) hypertension(-) angina+ Peripheral Vascular Disease  (-) Past MI  Rhythm:Regular     Neuro/Psych  Headaches, CVA, Residual Symptoms    GI/Hepatic Neg liver ROS,   Endo/Other  negative endocrine ROS  Renal/GU      Musculoskeletal  (+) Arthritis ,   Abdominal   Peds  Hematology  (+) anemia ,   Anesthesia Other Findings   Reproductive/Obstetrics                             Anesthesia Physical Anesthesia Plan  ASA: II  Anesthesia Plan: General   Post-op Pain Management:    Induction: Intravenous  PONV Risk Score and Plan: 2 and Ondansetron and Dexamethasone  Airway Management Planned: Oral ETT  Additional Equipment: None  Intra-op Plan:   Post-operative Plan: Extubation in OR  Informed Consent: I have reviewed the patients History and Physical, chart, labs and discussed the procedure including the risks, benefits and alternatives for the proposed anesthesia with the patient or authorized representative who has indicated his/her understanding and acceptance.   Dental advisory given  Plan Discussed with: CRNA and Surgeon  Anesthesia Plan Comments:         Anesthesia Quick Evaluation

## 2017-11-22 NOTE — Op Note (Signed)
OPERATIVE REPORT  DATE OF SURGERY: 11/22/2017  PATIENT:  Alexander English,  62 y.o. male  PRE-OPERATIVE DIAGNOSIS:  Squamous cell carcinoma maxillary sinuss  POST-OPERATIVE DIAGNOSIS:  Squamous cell carcinoma maxillary sinuss  PROCEDURE:  Procedure(s): MAXILLECTOMY SKIN GRAFT SPLIT THICKNESS  SURGEON:  Beckie Salts, MD  ASSISTANTS: Sharyn Dross, RNFA  ANESTHESIA:   General   EBL: 500 ml  DRAINS: None  LOCAL MEDICATIONS USED: 1% Xylocaine with epinephrine  SPECIMEN: Left partial maxillectomy, infraorbital margin, frozen section negative, superior lateral mucosal margin, frozen section, negative for carcinoma.  COUNTS:  Correct  PROCEDURE DETAILS: The patient was taken to the operating room and placed on the operating table in the supine position. Following induction of general endotracheal anesthesia, the face was prepped and draped in standard fashion as was the left thigh.  A Weber Ferguson incision was outlined with inferior orbital extension.  Local anesthetic was infiltrated along the incisions.  Electrocautery was used to incise the skin and subcutaneous tissue.  Dissection down towards the nasal bone was accomplished.  The nasal piriform aperture was exposed.  The lip was divided and the cheek flap was developed posteriorly down the left side.  The maxillary face periosteum was exposed and there were no bony defects identified.  There is no obvious tumor outside of the antrum.  The infraorbital rim was preserved.  Osteotomes were used for all of the bony cuts.  The premaxilla was divided.  The left medial incisor was extracted in its entirety.  The hard palate mucosal incision was created down to the palate bone using electrocautery with continuation across the hard/soft palate junction.  The bony cuts were created using osteotomes, straight and curved.  Premaxilla was divided first followed by the hard palate.  The nasal bone with continuation to the maxillary face below  the infraorbital nerve.  The zygomatic process was divided.  The maxillary tuberosity was exposed and soft tissue was divided using electrocautery.  Bleeding was controlled with bipolar cautery and standard cautery in this area.  Curved osteotome was used to divide the maxilla from the pterygoid plates.  Remaining soft tissue was divided using electrocautery.  The maxillectomy specimen was then removed and sent for permanent pathologic evaluation.  Frozen sections were taken from the 2 areas described above and were both negative.  Surgicel was packed into the region of the pterygomaxillary fossa as there was slight oozing in this area.  There is no other significant bleeding.  Split-thickness skin graft was harvested from the left thigh using a dermatome, 5 cm wide guide.  The graft was then meshed 1.5: 1.  The skin graft was applied to the buccal soft tissue defect.  5-0 plain gut and chromic suture was used to secure this in place.  The incisions were then reapproximated taking great care to line up all of the corners properly and to line up the vermilion border in the upper lip and the stairstep philtrum closure.  Upper lip mucosa was reapproximated with running 3-0 chromic.  Deep layer of closure was performed using interrupted 3-0 chromic.  Skin closure was accomplished using a combination of interrupted and running 5-0 plain gut.  A tarsorrhaphy was performed with 2 stitches at the initiation of the procedure and these were then taken down.  Bacitracin was applied to the suture lines.  The maxillotomy defect was packed with Xeroform gauze up against the skin graft and then 1 inch iodoform gauze.  A nasal trumpet was cut shorter about 2-1/2  cm and was placed in the left nasal cavity to facilitate healing and proper positioning.  The preformed maxillary prosthesis was then placed into position.  It is very well seated and did not require any additional stabilization.  The pharynx was suctioned of blood and  secretions.  I attempted to place a nasogastric tube through the oral cavity but was able unable to enter the esophagus.  The donor site was treated initially with adrenaline soaked 4 x 4 which was then removed and Xeroform was placed with an ABD dressing and Kerlix wrap.  Patient was awakened extubated and transferred to recovery in stable condition.    PATIENT DISPOSITION:  To PACU, stable

## 2017-11-22 NOTE — Progress Notes (Signed)
   ENT Progress Note:  s/p Procedure(s): MAXILLECTOMY SKIN GRAFT SPLIT THICKNESS   Subjective: Pt stable, mod discomfort  Objective: Vital signs in last 24 hours: Temp:  [97.4 F (36.3 C)-98.6 F (37 C)] 97.7 F (36.5 C) (05/29 1721) Pulse Rate:  [79-87] 83 (05/29 1721) Resp:  [11-20] 16 (05/29 1721) BP: (131-163)/(81-91) 163/91 (05/29 1721) SpO2:  [91 %-100 %] 100 % (05/29 1721) Weight change:     Intake/Output from previous day: No intake/output data recorded. Intake/Output this shift: Total I/O In: 1800 [I.V.:1800] Out: 700 [Urine:200; Blood:500]  Labs: Recent Labs    11/22/17 1328  HGB 13.6  HCT 40.0   Recent Labs    11/22/17 1328  NA 142  K 4.0    Studies/Results: No results found.   PHYSICAL EXAM: Prosthesis intact Min swelling No active bleeding   Assessment/Plan: Pt stable postop Cont current care    Dallas Torok 11/22/2017, 5:32 PM

## 2017-11-22 NOTE — Anesthesia Procedure Notes (Addendum)
Procedure Name: Intubation Date/Time: 11/22/2017 12:00 PM Performed by: Bryson Corona, CRNA Pre-anesthesia Checklist: Patient identified, Emergency Drugs available, Suction available and Patient being monitored Patient Re-evaluated:Patient Re-evaluated prior to induction Oxygen Delivery Method: Circle System Utilized Preoxygenation: Pre-oxygenation with 100% oxygen Induction Type: IV induction Ventilation: Two handed mask ventilation required, Mask ventilation with difficulty and Oral airway inserted - appropriate to patient size Laryngoscope Size: Glidescope and 4 Grade View: Grade II Tube type: Oral Tube size: 7.0 mm Number of attempts: 2 Airway Equipment and Method: Stylet and Oral airway Placement Confirmation: ETT inserted through vocal cords under direct vision,  positive ETCO2 and breath sounds checked- equal and bilateral Secured at: 23 (lip) cm Tube secured with: Tape Dental Injury: Teeth and Oropharynx as per pre-operative assessment  Comments: Intubation performed by Devin Washing, SRNA. DLx1 w Mac 4, unable to visualize airway. 2nd attempt with glidescope 4, able to visualize airway grade 2 view, L peritonsillar mass.

## 2017-11-23 ENCOUNTER — Encounter (HOSPITAL_COMMUNITY): Payer: Self-pay | Admitting: General Practice

## 2017-11-23 ENCOUNTER — Other Ambulatory Visit: Payer: Self-pay

## 2017-11-23 LAB — CBC
HCT: 34.4 % — ABNORMAL LOW (ref 39.0–52.0)
Hemoglobin: 10.5 g/dL — ABNORMAL LOW (ref 13.0–17.0)
MCH: 23.2 pg — ABNORMAL LOW (ref 26.0–34.0)
MCHC: 30.5 g/dL (ref 30.0–36.0)
MCV: 75.9 fL — ABNORMAL LOW (ref 78.0–100.0)
Platelets: 238 10*3/uL (ref 150–400)
RBC: 4.53 MIL/uL (ref 4.22–5.81)
RDW: 15.2 % (ref 11.5–15.5)
WBC: 10.9 10*3/uL — ABNORMAL HIGH (ref 4.0–10.5)

## 2017-11-23 LAB — BASIC METABOLIC PANEL
Anion gap: 6 (ref 5–15)
BUN: 8 mg/dL (ref 6–20)
CO2: 23 mmol/L (ref 22–32)
Calcium: 8.7 mg/dL — ABNORMAL LOW (ref 8.9–10.3)
Chloride: 107 mmol/L (ref 101–111)
Creatinine, Ser: 0.9 mg/dL (ref 0.61–1.24)
GFR calc Af Amer: >60 mL/min (ref >60)
GFR calc non Af Amer: >60 mL/min (ref >60)
Glucose, Bld: 126 mg/dL — ABNORMAL HIGH (ref 65–99)
Potassium: 4.1 mmol/L (ref 3.5–5.1)
Sodium: 136 mmol/L (ref 135–145)

## 2017-11-23 LAB — PROTIME-INR
INR: 1.13
Prothrombin Time: 14.4 seconds (ref 11.4–15.2)

## 2017-11-23 NOTE — Progress Notes (Signed)
Patient able to tolerate regular diet. Patient walked  around the unit with [facility]

## 2017-11-23 NOTE — Progress Notes (Signed)
Patient ID: Alexander English, male   DOB: 17-Jul-1955, 62 y.o.   MRN: [id number] Subjective: Doing well this morning, no complaints, minimal pain.  Objective: Vital signs in last 24 hours: Temp:  [97.4 F (36.3 C)-98.6 F (37 C)] 98.3 F (36.8 C) (05/30 0405) Pulse Rate:  [79-97] 96 (05/30 0405) Resp:  [11-20] 18 (05/30 0405) BP: (131-163)/(81-91) 137/81 (05/30 0405) SpO2:  [91 %-100 %] 96 % (05/30 0405) Weight change:  Last BM Date: 11/20/17  Intake/Output from previous day: 05/29 0701 - 05/30 0700 In: 2798.8 [I.V.:2698.8; IV Piggyback:100] Out: 1750 [Urine:1250; Blood:500] Intake/Output this shift: No intake/output data recorded.  PHYSICAL EXAM: Incisions all clean and intact.  No swelling.  Speech is excellent.  Upper denture in place.  No packing exposed.  Lab Results: Recent Labs    11/22/17 2023 11/23/17 0500  WBC 9.6 10.9*  HGB 11.7* 10.5*  HCT 38.8* 34.4*  PLT 256 238   BMET Recent Labs    11/22/17 1328 11/23/17 0500  NA 142 136  K 4.0 4.1  CL  --  107  CO2  --  23  GLUCOSE  --  126*  BUN  --  8  CREATININE  --  0.90  CALCIUM  --  8.7*    Studies/Results: No results found.  Medications: I have reviewed the patient's current medications.  Assessment/Plan: Stable postop day 1.  Start diet today.  Anticipate discharge home as soon as he is up walking and able to eat and drink.  LOS: 1 day   [name] 11/23/2017, 8:08 AM

## 2017-11-23 NOTE — Progress Notes (Signed)
Patient able to swallow pills without difficulty.

## 2017-11-23 NOTE — Anesthesia Postprocedure Evaluation (Signed)
Anesthesia Post Note  Patient: Alexander English  Procedure(s) Performed: MAXILLECTOMY (Left Mouth) SKIN GRAFT SPLIT THICKNESS (Left Leg Upper)     Patient location during evaluation: PACU Anesthesia Type: General Level of consciousness: awake and alert Pain management: pain level controlled Vital Signs Assessment: post-procedure vital signs reviewed and stable Respiratory status: spontaneous breathing, nonlabored ventilation, respiratory function stable and patient connected to nasal cannula oxygen Cardiovascular status: blood pressure returned to baseline and stable Postop Assessment: no apparent nausea or vomiting Anesthetic complications: no    Last Vitals:  Vitals:   11/23/17 1021 11/23/17 1346  BP: 137/73 113/68  Pulse: 91 (!) 110  Resp: 18 18  Temp: 36.6 C 36.8 C  SpO2: 100% 99%    Last Pain:  Vitals:   11/23/17 1346  TempSrc: Oral  PainSc:                  Meliza Kage

## 2017-11-24 LAB — PROTIME-INR
INR: 1.14
Prothrombin Time: 14.6 seconds (ref 11.4–15.2)

## 2017-11-24 MED ORDER — HYDROCODONE-ACETAMINOPHEN 7.5-325 MG/15ML PO SOLN: 15.0000 mL | Freq: Four times a day (QID) | ORAL | 0 refills | Status: DC | PRN

## 2017-11-24 MED ORDER — PROMETHAZINE HCL 25 MG RE SUPP: 25.0000 mg | Freq: Four times a day (QID) | RECTAL | 1 refills | Status: DC | PRN

## 2017-11-24 MED ORDER — CLINDAMYCIN HCL 300 MG PO CAPS: 300.0000 mg | ORAL_CAPSULE | Freq: Three times a day (TID) | ORAL | 0 refills | Status: DC

## 2017-11-24 NOTE — Discharge Instructions (Signed)
It is okay to brush your teeth. It is okay to use mouthwash.  Keep the denture in place. Keep the nasal trumpet in place. If the nasal trumpet falls out, there is no need to try to replace it.  Eat whatever foods are able to eat.  Change the dressing on the leg whenever necessary.  Apply bacitracin ointment to all fascial incisions twice daily.

## 2017-11-24 NOTE — Discharge Summary (Signed)
Physician Discharge Summary  Patient ID: Alexander English MRN: [id number] DOB/AGE: 1956-04-30 62 y.o.  Admit date: 11/22/2017 Discharge date: 11/24/2017  Admission Diagnoses:maxillary sinus cancer  Discharge Diagnoses:  Active Problems:   Maxillary sinus cancer [facility])   Discharged Condition: good  Hospital Course: no complications   Consults: none  Significant Diagnostic Studies: none  Treatments: surgery: maxillectomy, skin graft  Discharge Exam: Blood pressure 128/80, pulse 86, temperature 98.9 F (37.2 C), temperature source Oral, resp. rate 18, height 6' (1.829 m), weight 105 kg (231 lb 7.7 oz), SpO2 100 %. PHYSICAL EXAM: Fascial incisions intact. Minimal swelling, no signs of infection. Maxillary denture in place. No visible packing.  Disposition:       Signed: [name] 11/24/2017, 11:32 AM

## 2017-11-24 NOTE — Consult Note (Signed)
[facility] CM Primary Care Navigator  11/24/2017  Alexander English 1955-10-18 [id number]   Met withpatientat the bedsideto identify possible discharge needs. Patient reports that he was seen for left-sided nasal obstruction and nosebleeds with biopsy revealing squamous cell carcinoma that had led to this admission/ surgery. (maxillary sinus cancer status post maxillectomy)  [facility].   Patient verbalizedusing [place] to obtain medications without difficulty.   Patient reportsmanaginghismedications at homestraight out of the containers but using his own tracking system- "marking calendar".  Patient statesthathehas beendriving prior to admission/ surgery but girlfriend Alexander English) will beable to provide transportation to hisdoctors'appointments if needed after discharge.  [employer] transportation benefits discussed with patient as well.    Patient lives alone and has been independent with self care prior to this admission. He states that girlfriend and his sister Alexander English) will be assisting him with care at home when needed.  Anticipateddischarge planishomeaccording to patient.  Patientvoiced understanding to call primary care provider's officewhenhereturnshomefor a post discharge follow-up visit within1- 2weeksor sooner if needs arise.Patient letter (with PCP's contact number) was provided ashis reminder.   Discussed with patient about[facility] CM services available for health management and resources at home buthedeniesany needs or concerns at this time. Patienthad expressed understanding to seekreferral from primary care provider to [facility] care management if deemednecessaryand appropriate for any services in the future.  [facility] care management information was provided for future needs thathemay  have.  Patienthowever, verbally agreedand opted forEMMIcalls tofollowup hisrecoveryat home.   Referral made for [facility] General calls after discharge.    For additional questions please contact:  Edwena Felty A. Reign Dziuba, BSN, RN-BC [facility] PRIMARY CARE Navigator Cell: [phone]

## 2017-11-24 NOTE — Discharge Planning (Signed)
I see this patient in the [facility] as an ambulatory patient. I have provided patient with dosing instructions (from www.doseresponse.com) for his warfarin at time of discharge. Patient has been instructed to commence taking at time of discharge, his 5mg  peach-colored warfarin tablets as such:  Friday, Nov 24, 2017--take one and one-half (1& 1/2) of your 5mg  peach-colored tablets; Saturday, November 25, 2017--take one and one-half (1& 1/2) of your 5mg  peach-colored tablets; Sunday, November 26, 2017--take only one (1) tablet of your 5mg  peach-colored warfarin tablets. Monday, June 3, please come to the [facility] to see me in the anticoagulation management clinic at 9:15AM. Patient has my phone number to call or text me as needed to address any questions or concerns regarding his warfarin between discharge and clinic appointment on Monday.

## 2017-11-24 NOTE — Progress Notes (Signed)
Pt for discharge going home with family member at the bedside, health teachings, due meds, next appointment explained and understood, given all his personal belongings, wound site dry and intact, no s/s of distress at this time.

## 2017-11-27 ENCOUNTER — Ambulatory Visit: Payer: Medicare HMO

## 2017-11-28 NOTE — Discharge Summary (Signed)
Physician Discharge Summary  Patient ID: Alexander English MRN: [id number] DOB/AGE: 63/01/1956 62 y.o.  Admit date: 11/22/2017 Discharge date: 11/28/2017  Admission Diagnoses:maxillary sinus cancer  Discharge Diagnoses:  Active Problems:   Maxillary sinus cancer ([place])   Discharged Condition: good  Hospital Course: no comp[lications  Consults: none  Significant Diagnostic Studies: none  Treatments: surgery: maxillectomy, skin graft  Discharge Exam: Blood pressure (!) 149/80, pulse 96, temperature 99.3 F (37.4 C), temperature source Oral, resp. rate 18, height 6' (1.829 m), weight 105 kg (231 lb 7.7 oz), SpO2 100 %. PHYSICAL EXAM: Packing, temporary obturator and nasal trumpet all in place.  Disposition:   Discharge Instructions    Diet - low sodium heart healthy   Complete by:  As directed    Diet - low sodium heart healthy   Complete by:  As directed    Increase activity slowly   Complete by:  As directed    Increase activity slowly   Complete by:  As directed      Allergies as of 11/24/2017   No Known Allergies     Medication List    TAKE these medications   aspirin EC 81 MG tablet Take 81 mg by mouth daily.   atorvastatin 10 MG tablet Commonly known as:  LIPITOR Take 1 tablet (10 mg total) daily by mouth.   clindamycin 300 MG capsule Commonly known as:  CLEOCIN Take 1 capsule (300 mg total) by mouth 3 (three) times daily.   fesoterodine 4 MG Tb24 tablet Commonly known as:  TOVIAZ Take 1 tablet (4 mg total) by mouth daily.   HYDROcodone-acetaminophen 7.5-325 mg/15 ml solution Commonly known as:  HYCET Take 15 mLs by mouth 4 (four) times daily as needed for moderate pain.   multivitamin tablet Take 1 tablet by mouth daily.   MYRBETRIQ 25 MG Tb24 tablet Generic drug:  mirabegron ER Take 25 mg by mouth daily.   promethazine 25 MG suppository Commonly known as:  PHENERGAN Place 1 suppository (25 mg total) rectally every 6 (six) hours as needed  for nausea or vomiting.   sorbitol 70 % solution Take 15 mLs by mouth daily as needed.   warfarin 5 MG tablet Commonly known as:  COUMADIN Take as directed. If you are unsure how to take this medication, talk to your nurse or doctor. Original instructions:  Take 1 tablet (5 mg total) by mouth daily at 6 PM.   zolpidem 5 MG tablet Commonly known as:  AMBIEN Take 1 tablet (5 mg total) by mouth at bedtime as needed for sleep.      Follow-up Information    [name], MD. Schedule an appointment as soon as possible for a visit in 1 week(s).   Specialty:  Otolaryngology Contact information: [address] [place] [address] [phone]           Signed: [name] 11/28/2017, 1:13 PM

## 2017-11-30 ENCOUNTER — Ambulatory Visit: Payer: Medicare HMO

## 2017-12-04 ENCOUNTER — Ambulatory Visit (INDEPENDENT_AMBULATORY_CARE_PROVIDER_SITE_OTHER): Payer: Medicare HMO | Admitting: Internal Medicine

## 2017-12-04 ENCOUNTER — Encounter: Payer: Self-pay | Admitting: Internal Medicine

## 2017-12-04 ENCOUNTER — Other Ambulatory Visit: Payer: Self-pay

## 2017-12-04 ENCOUNTER — Ambulatory Visit (INDEPENDENT_AMBULATORY_CARE_PROVIDER_SITE_OTHER): Payer: Medicare HMO | Admitting: Pharmacy Technician

## 2017-12-04 DIAGNOSIS — I69359 Hemiplegia and hemiparesis following cerebral infarction affecting unspecified side: Secondary | ICD-10-CM

## 2017-12-04 DIAGNOSIS — I69328 Other speech and language deficits following cerebral infarction: Secondary | ICD-10-CM | POA: Diagnosis not present

## 2017-12-04 DIAGNOSIS — M549 Dorsalgia, unspecified: Secondary | ICD-10-CM

## 2017-12-04 DIAGNOSIS — Z7901 Long term (current) use of anticoagulants: Secondary | ICD-10-CM | POA: Diagnosis not present

## 2017-12-04 DIAGNOSIS — Z7982 Long term (current) use of aspirin: Secondary | ICD-10-CM

## 2017-12-04 DIAGNOSIS — E785 Hyperlipidemia, unspecified: Secondary | ICD-10-CM | POA: Diagnosis not present

## 2017-12-04 DIAGNOSIS — Z86718 Personal history of other venous thrombosis and embolism: Secondary | ICD-10-CM | POA: Diagnosis not present

## 2017-12-04 DIAGNOSIS — R51 Headache: Secondary | ICD-10-CM | POA: Diagnosis not present

## 2017-12-04 DIAGNOSIS — R519 Headache, unspecified: Secondary | ICD-10-CM | POA: Insufficient documentation

## 2017-12-04 DIAGNOSIS — Z9089 Acquired absence of other organs: Secondary | ICD-10-CM

## 2017-12-04 DIAGNOSIS — M170 Bilateral primary osteoarthritis of knee: Secondary | ICD-10-CM | POA: Diagnosis not present

## 2017-12-04 DIAGNOSIS — G8929 Other chronic pain: Secondary | ICD-10-CM | POA: Diagnosis not present

## 2017-12-04 DIAGNOSIS — C310 Malignant neoplasm of maxillary sinus: Secondary | ICD-10-CM | POA: Diagnosis not present

## 2017-12-04 LAB — POCT INR: INR: 2.1 (ref 2.0–3.0)

## 2017-12-04 NOTE — Progress Notes (Signed)
Anticoagulation Management Alexander English is a 62 y.o. male who reports to the clinic for monitoring of warfarin treatment.    Indication: History of VTE  Duration: indefinite Supervising physician: Aldine Contes  Anticoagulation Clinic Visit History: Patient does not report signs/symptoms of bleeding or thromboembolism. Other recent changes: Now s/p surgery on 11/24/17  Anticoagulation Episode Summary    Current INR goal:   2.0-3.0  TTR:   83.0 % (5.7 y)  Next INR check:   12/18/2017  INR from last check:   2.1 (12/04/2017)  Weekly max warfarin dose:     Target end date:   Indefinite  INR check location:   Anticoagulation Clinic  Preferred lab:     Send INR reminders to:   ANTICOAG IMP   Indications   History of venous thromboembolism [Z86.718]       Comments:           No Known Allergies Prior to Admission medications   Medication Sig Start Date End Date Taking? Authorizing Provider  aspirin EC 81 MG tablet Take 81 mg by mouth daily.   Yes [provider]  atorvastatin (LIPITOR) 10 MG tablet Take 1 tablet (10 mg total) daily by mouth. 05/12/17  Yes [name], MD  clindamycin (CLEOCIN) 300 MG capsule Take 1 capsule (300 mg total) by mouth 3 (three) times daily. 11/24/17  Yes [name], MD  fesoterodine (TOVIAZ) 4 MG TB24 tablet Take 1 tablet (4 mg total) by mouth daily. 11/06/17  Yes Rice, Resa Miner, MD  HYDROcodone-acetaminophen (HYCET) 7.5-325 mg/15 ml solution Take 15 mLs by mouth 4 (four) times daily as needed for moderate pain. 11/24/17  Yes [name], MD  mirabegron ER (MYRBETRIQ) 25 MG TB24 tablet Take 25 mg by mouth daily.    Yes [provider]  Multiple Vitamin (MULTIVITAMIN) tablet Take 1 tablet by mouth daily.   Yes [provider]  promethazine (PHENERGAN) 25 MG suppository Place 1 suppository (25 mg total) rectally every 6 (six) hours as needed for nausea or vomiting. 11/24/17  Yes [name], MD  sorbitol 70 %  solution Take 15 mLs by mouth daily as needed. 03/31/17  Yes [name], MD  warfarin (COUMADIN) 5 MG tablet Take 1 tablet (5 mg total) by mouth daily at 6 PM. 11/13/17  Yes [name], MD  zolpidem (AMBIEN) 5 MG tablet Take 1 tablet (5 mg total) by mouth at bedtime as needed for sleep. 10/16/17  Yes [name], MD   Past Medical History:  Diagnosis Date  . Aortic atherosclerosis ([place]) 10/23/2017   Asymptomatic, seen on CT scan  . Bilateral cataracts 05/18/2016   Not visually significant  . Chronic constipation 06/12/2013  . Chronic low back pain 06/12/2013   L4-5 facet arthropathy   . Chronic pain of both shoulders 12/07/2013   A/C joint osteoarthritis   . Cluster headaches    Resolved in 2006  . COPD (chronic obstructive pulmonary disease) ([place])   . Dry eye syndrome, bilateral 05/18/2016  . Embolic stroke involving right middle cerebral artery ([place]) 08/08/2004   Occured after traveling from [place] to [place] where he was hospitalized for 1 month with no further work-up or therapy.  Returned to [place] unable to walk and was admitted to [facility] immediately after landing. Presumed embolic per neuro but TEE, bubble study, and hypercoag W/U negative. On indefinite coumadin per patient's informed preference.  Residual effects : Left spastic hemiplegia. Tx with phenol tibi  . History of kidney stones   .  Hyperplastic colon polyp 07/27/2012   Excised endoscopically 07/27/2012  . Hypertensive retinopathy of both eyes 05/18/2016  . Left nephrolithiasis 08/31/2015   With mild left hydronephrosis.  Scheduled to undergo shockwave lithotripsy.  . Obesity (BMI 30.0-34.9) 12/07/2013  . Osteoarthritis of both knees 01/26/2012  . Positive PPD 12/07/2013  . Pulmonary embolism ([place]) 09/30/2004   Diagnosed in April 2006 after returning from [place].  Likely provoked as it occurred after a 12 hour plane flight within 2 months of R MCA CVA with left hemiparesis. Patient has made an informed decision to remain on  lifelong warfarin.   . Urge incontinence of urine 08/31/2015   Possibly secondary to prior CVA.  Treated with Myrbetriq.   Social History   Socioeconomic History  . Marital status: Divorced    Spouse name: Not on file  . Number of children: Not on file  . Years of education: Not on file  . Highest education level: Not on file  Occupational History  . Not on file  Social Needs  . Financial resource strain: Not on file  . Food insecurity:    Worry: Not on file    Inability: Not on file  . Transportation needs:    Medical: Not on file    Non-medical: Not on file  Tobacco Use  . Smoking status: Former Smoker    Last attempt to quit: 06/28/2007    Years since quitting: 10.4  . Smokeless tobacco: Never Used  Substance and Sexual Activity  . Alcohol use: No    Alcohol/week: 0.0 oz    Comment: Remote past  . Drug use: No  . Sexual activity: Not on file  Lifestyle  . Physical activity:    Days per week: Not on file    Minutes per session: Not on file  . Stress: Not on file  Relationships  . Social connections:    Talks on phone: Not on file    Gets together: Not on file    Attends religious service: Not on file    Active member of club or organization: Not on file    Attends meetings of clubs or organizations: Not on file    Relationship status: Not on file  Other Topics Concern  . Not on file  Social History Narrative   Born in [place] but has been in the [place] for > 30 years.  Divorced, 1 daughter and 2 sons.  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   Family History  Problem Relation Age of Onset  . Stroke Maternal Grandmother   . Unexplained death Mother   . Depression Mother        After husband's death  . Unexplained death Father   . Osteoarthritis Father        Knees  . Early death Sister   . Seizures Sister   . Early death Brother 15       Unknown cause  . Healthy Daughter   . Healthy Son   . Stroke Maternal Aunt    . Healthy Sister   . Healthy Sister   . Unexplained death Brother   . Healthy Brother   . Healthy Son     ASSESSMENT Recent Results: The most recent result is correlated with 35 mg per week: Lab Results  Component Value Date   INR 2.1 12/04/2017   INR 1.14 11/24/2017   INR 1.13 11/23/2017    Anticoagulation Dosing: Take one (1) tablet of  your 5mg  peach-colored warfarin tablets by mouth, once-daily at 6PM.     INR today: Therapeutic  PLAN Weekly dose was increased s/p surgery back to 5mg  once daily for 35mg /week  Patient Instructions  Patient instructed to take medications as defined in the Anti-coagulation Track section of this encounter.  Patient instructed to take today's dose Patient instructed to take one (1) tablet of your 5mg  peach-colored warfarin tablets by mouth, once-daily at [facility].  Patient verbalized understanding of these instructions.     Patient advised to contact clinic or seek medical attention if signs/symptoms of bleeding or thromboembolism occur.  Patient verbalized understanding by repeating back information and was advised to contact me if further medication-related questions arise. Patient was also provided an information handout.  Follow-up Return in about 2 weeks (around 12/18/2017) for INR follow up at 1030.  Bertis Ruddy, PharmD Pharmacy Resident [phone] 12/04/2017 9:44 AM  15 minutes spent face-to-face with the patient during the encounter. 50% of time spent on education. 50% of time was spent on INR point of care testing, analysis and documentation in EMR.

## 2017-12-04 NOTE — Patient Instructions (Signed)
It was a pleasure to see you Alexander English.  It is okay to use tylenol as needed for your headache.   If you notice any new changes in vision, weakness, or severe headache, please seek medical attention urgently.  Please follow up with Dr. Eppie Gibson when available.

## 2017-12-04 NOTE — Assessment & Plan Note (Signed)
Patient reports about 3 weeks of pain at the left side of his head described as an aching sensation that has been constant. He is s/p maxillectomy on 11/22/17. He says his current symptoms have been occurring prior to his surgery. He denies any injury to the area. He denies any vision changes, nausea, vomiting, lightheadedness, dizziness, or loss of consciousness. He been taking Tylenol sparingly as needed with some benefit. He denies any NSAID or goody powder use. He denies significant caffeine intake. He reports sleeping well. He denies any pain with chewing. He wears dentures but does not think this is related to his pain. He is taking Coumadin and a low dose aspirin.  A/P: Patient with constant left sided headache for about 3 weeks now. He is on anticoagulation but is not having any alarm symptoms concerning for intracranial bleed to warrant a CT head. His INR today is appropriate at 2.1. He is following closely with ENT for his maxillary sinus cancer s/p recent maxillectomy which appears to be healing appropriately. At this time, will recommend conservative management with as needed tylenol and he is counseled on alarm symptoms.

## 2017-12-04 NOTE — Progress Notes (Signed)
CC: left sided headache  HPI:  Alexander English is a 62 y.o. male with PMH as listed below including CVA, VTE, Maxillary sinus cancer s/p maxillectomy, HLD, OA of both knees, and chronic back pain who presents for evaluation of left sided headache.  Left-sided headache Patient reports about 3 weeks of pain at the left side of his head described as an aching sensation that has been constant. He is s/p maxillectomy on 11/22/17. He says his current symptoms have been occurring prior to his surgery. He denies any injury to the area. He denies any vision changes, nausea, vomiting, lightheadedness, dizziness, or loss of consciousness. He been taking Tylenol sparingly as needed with some benefit. He denies any NSAID or goody powder use. He denies significant caffeine intake. He reports sleeping well. He denies any pain with chewing. He wears dentures but does not think this is related to his pain. He is taking Coumadin and a low dose aspirin.  A/P: Patient with constant left sided headache for about 3 weeks now. He is on anticoagulation but is not having any alarm symptoms concerning for intracranial bleed to warrant a CT head. His INR today is appropriate at 2.1. He is following closely with ENT for his maxillary sinus cancer s/p recent maxillectomy which appears to be healing appropriately. At this time, will recommend conservative management with as needed tylenol and he is counseled on alarm symptoms.    Past Medical History:  Diagnosis Date  . Aortic atherosclerosis ([place]) 10/23/2017   Asymptomatic, seen on CT scan  . Bilateral cataracts 05/18/2016   Not visually significant  . Chronic constipation 06/12/2013  . Chronic low back pain 06/12/2013   L4-5 facet arthropathy   . Chronic pain of both shoulders 12/07/2013   A/C joint osteoarthritis   . Cluster headaches    Resolved in 2006  . COPD (chronic obstructive pulmonary disease) ([place])   . Dry eye syndrome, bilateral 05/18/2016  .  Embolic stroke involving right middle cerebral artery ([place]) 08/08/2004   Occured after traveling from [place] to [place] where he was hospitalized for 1 month with no further work-up or therapy.  Returned to [place] unable to walk and was admitted to [facility] immediately after landing. Presumed embolic per neuro but TEE, bubble study, and hypercoag W/U negative. On indefinite coumadin per patient's informed preference.  Residual effects : Left spastic hemiplegia. Tx with phenol tibi  . History of kidney stones   . Hyperplastic colon polyp 07/27/2012   Excised endoscopically 07/27/2012  . Hypertensive retinopathy of both eyes 05/18/2016  . Left nephrolithiasis 08/31/2015   With mild left hydronephrosis.  Scheduled to undergo shockwave lithotripsy.  . Obesity (BMI 30.0-34.9) 12/07/2013  . Osteoarthritis of both knees 01/26/2012  . Positive PPD 12/07/2013  . Pulmonary embolism ([place]) 09/30/2004   Diagnosed in April 2006 after returning from [place].  Likely provoked as it occurred after a 12 hour plane flight within 2 months of R MCA CVA with left hemiparesis. Patient has made an informed decision to remain on lifelong warfarin.   . Urge incontinence of urine 08/31/2015   Possibly secondary to prior CVA.  Treated with Myrbetriq.   Review of Systems:   Review of Systems  Eyes: Negative for blurred vision, double vision and pain.  Gastrointestinal: Negative for nausea and vomiting.  Musculoskeletal: Negative for falls.  Skin: Negative for rash.  Neurological: Positive for headaches. Negative for dizziness and loss of consciousness.       No new weakness or  sensory change   Physical Exam:  Vitals:   12/04/17 0850  BP: 127/75  Pulse: 84  Temp: 97.8 F (36.6 C)  TempSrc: Oral  SpO2: 99%  Weight: 221 lb 6.4 oz (100.4 kg)  Height: 6' (1.829 m)   Physical Exam  Constitutional: He is oriented to person, place, and time. He appears well-nourished. No distress.  HENT:  Surgical wound along left border of nose,  healing well without drainage. No wounds, lesions, or rash on head. He is bald. Upper dentures in place. No TMJ tenderness.  Eyes: Pupils are equal, round, and reactive to light. EOM are normal.  Cardiovascular: Normal rate and regular rhythm.  No murmur heard. Pulmonary/Chest: Effort normal. No respiratory distress. He has no wheezes. He has no rales.  Musculoskeletal:  Left knee brace. Ambulates with cane.  Neurological: He is alert and oriented to person, place, and time.  Slurred speech, LUE weakness 2/2 to prior CVA.   Skin: Skin is warm. He is not diaphoretic.     Assessment & Plan:   See Encounters Tab for problem based charting.  Patient discussed with Dr. Dareen Piano

## 2017-12-04 NOTE — Patient Instructions (Addendum)
Patient instructed to take medications as defined in the Anti-coagulation Track section of this encounter.  Patient instructed to take today's dose.  Patient instructed to take one (1) tablet of your 5mg peach-colored warfarin tablets by mouth, once-daily at 6PM. Patient verbalized understanding of these instructions.    

## 2017-12-04 NOTE — Progress Notes (Signed)
INTERNAL MEDICINE TEACHING ATTENDING ADDENDUM - Tollie Canada M.D  Duration- indefinite, Indication- VTE, INR- therapeutic. Agree with pharmacy recommendations as outlined in their note.     

## 2017-12-06 ENCOUNTER — Telehealth: Payer: Self-pay | Admitting: Internal Medicine

## 2017-12-06 NOTE — Telephone Encounter (Signed)
Patient Rec'd a call from Dr. Perley Jain Office stating it is time for him to have a repeat Colonoscopy.  Please advise a Referral can be placed.

## 2017-12-06 NOTE — Progress Notes (Signed)
Internal Medicine Clinic Attending  Case discussed with Dr. Patel at the time of the visit.  We reviewed the resident's history and exam and pertinent patient test results.  I agree with the assessment, diagnosis, and plan of care documented in the resident's note.  

## 2017-12-07 NOTE — Telephone Encounter (Signed)
Colonoscopy on 07/27/2012 demonstrated multiple small polyps that were biopsied.  Pathology was consistent with hyperplastic polyps.  I am not aware of a previous history of colonic polyps.  He therefore is not due for a colonoscopy until January 2024.

## 2017-12-18 ENCOUNTER — Ambulatory Visit (INDEPENDENT_AMBULATORY_CARE_PROVIDER_SITE_OTHER): Payer: Medicare HMO

## 2017-12-18 DIAGNOSIS — Z7901 Long term (current) use of anticoagulants: Secondary | ICD-10-CM

## 2017-12-18 DIAGNOSIS — Z5181 Encounter for therapeutic drug level monitoring: Secondary | ICD-10-CM | POA: Diagnosis not present

## 2017-12-18 DIAGNOSIS — Z86718 Personal history of other venous thrombosis and embolism: Secondary | ICD-10-CM | POA: Diagnosis not present

## 2017-12-18 LAB — POCT INR: INR: 3.1 — AB (ref 2.0–3.0)

## 2017-12-18 NOTE — Progress Notes (Signed)
Anticoagulation Management Alexander English is a 62 y.o. male who reports to the clinic for monitoring of warfarin treatment.    Indication: History of venous thromboembolism  Duration: indefinite Supervising physician: Gilles Chiquito  Anticoagulation Clinic Visit History: Patient does not report signs/symptoms of bleeding or thromboembolism  Other recent changes: Denies diet, medications, lifestyle changes Anticoagulation Episode Summary    Current INR goal:   2.0-3.0  TTR:   83.1 % (5.7 y)  Next INR check:   01/08/2018  INR from last check:   3.1! (12/18/2017)  Weekly max warfarin dose:     Target end date:   Indefinite  INR check location:   Anticoagulation Clinic  Preferred lab:     Send INR reminders to:   ANTICOAG IMP   Indications   History of venous thromboembolism [Z86.718]       Comments:           No Known Allergies Prior to Admission medications   Medication Sig Start Date End Date Taking? Authorizing Provider  aspirin EC 81 MG tablet Take 81 mg by mouth daily.    [provider]  atorvastatin (LIPITOR) 10 MG tablet Take 1 tablet (10 mg total) daily by mouth. 05/12/17   [name], MD  clindamycin (CLEOCIN) 300 MG capsule Take 1 capsule (300 mg total) by mouth 3 (three) times daily. 11/24/17   [name], MD  fesoterodine (TOVIAZ) 4 MG TB24 tablet Take 1 tablet (4 mg total) by mouth daily. 11/06/17   Rice, Resa Miner, MD  HYDROcodone-acetaminophen (HYCET) 7.5-325 mg/15 ml solution Take 15 mLs by mouth 4 (four) times daily as needed for moderate pain. 11/24/17   [name], MD  mirabegron ER (MYRBETRIQ) 25 MG TB24 tablet Take 25 mg by mouth daily.     [provider]  Multiple Vitamin (MULTIVITAMIN) tablet Take 1 tablet by mouth daily.    [provider]  promethazine (PHENERGAN) 25 MG suppository Place 1 suppository (25 mg total) rectally every 6 (six) hours as needed for nausea or vomiting. 11/24/17   [name], MD  sorbitol  70 % solution Take 15 mLs by mouth daily as needed. 03/31/17   [name], MD  warfarin (COUMADIN) 5 MG tablet Take 1 tablet (5 mg total) by mouth daily at 6 PM. 11/13/17   [name], MD  zolpidem (AMBIEN) 5 MG tablet Take 1 tablet (5 mg total) by mouth at bedtime as needed for sleep. 10/16/17   [name], MD   Past Medical History:  Diagnosis Date  . Aortic atherosclerosis ([place]) 10/23/2017   Asymptomatic, seen on CT scan  . Bilateral cataracts 05/18/2016   Not visually significant  . Chronic constipation 06/12/2013  . Chronic low back pain 06/12/2013   L4-5 facet arthropathy   . Chronic pain of both shoulders 12/07/2013   A/C joint osteoarthritis   . Cluster headaches    Resolved in 2006  . COPD (chronic obstructive pulmonary disease) ([place])   . Dry eye syndrome, bilateral 05/18/2016  . Embolic stroke involving right middle cerebral artery ([place]) 08/08/2004   Occured after traveling from [place] to [place] where he was hospitalized for 1 month with no further work-up or therapy.  Returned to [place] unable to walk and was admitted to [facility] immediately after landing. Presumed embolic per neuro but TEE, bubble study, and hypercoag W/U negative. On indefinite coumadin per patient's informed preference.  Residual effects : Left spastic hemiplegia. Tx with phenol tibi  . History of kidney stones   .  Hyperplastic colon polyp 07/27/2012   Excised endoscopically 07/27/2012  . Hypertensive retinopathy of both eyes 05/18/2016  . Left nephrolithiasis 08/31/2015   With mild left hydronephrosis.  Scheduled to undergo shockwave lithotripsy.  . Obesity (BMI 30.0-34.9) 12/07/2013  . Osteoarthritis of both knees 01/26/2012  . Positive PPD 12/07/2013  . Pulmonary embolism ([place]) 09/30/2004   Diagnosed in April 2006 after returning from [place].  Likely provoked as it occurred after a 12 hour plane flight within 2 months of R MCA CVA with left hemiparesis. Patient has made an informed decision to remain on lifelong  warfarin.   . Urge incontinence of urine 08/31/2015   Possibly secondary to prior CVA.  Treated with Myrbetriq.   Social History   Socioeconomic History  . Marital status: Divorced    Spouse name: Not on file  . Number of children: Not on file  . Years of education: Not on file  . Highest education level: Not on file  Occupational History  . Not on file  Social Needs  . Financial resource strain: Not on file  . Food insecurity:    Worry: Not on file    Inability: Not on file  . Transportation needs:    Medical: Not on file    Non-medical: Not on file  Tobacco Use  . Smoking status: Former Smoker    Last attempt to quit: 06/28/2007    Years since quitting: 10.4  . Smokeless tobacco: Never Used  Substance and Sexual Activity  . Alcohol use: No    Alcohol/week: 0.0 oz    Comment: Remote past  . Drug use: No  . Sexual activity: Not on file  Lifestyle  . Physical activity:    Days per week: Not on file    Minutes per session: Not on file  . Stress: Not on file  Relationships  . Social connections:    Talks on phone: Not on file    Gets together: Not on file    Attends religious service: Not on file    Active member of club or organization: Not on file    Attends meetings of clubs or organizations: Not on file    Relationship status: Not on file  Other Topics Concern  . Not on file  Social History Narrative   Born in [place] but has been in the [place] for > 30 years.  Divorced, 1 daughter and 2 sons.  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   Family History  Problem Relation Age of Onset  . Stroke Maternal Grandmother   . Unexplained death Mother   . Depression Mother        After husband's death  . Unexplained death Father   . Osteoarthritis Father        Knees  . Early death Sister   . Seizures Sister   . Early death Brother 22       Unknown cause  . Healthy Daughter   . Healthy Son   . Stroke Maternal Aunt   .  Healthy Sister   . Healthy Sister   . Unexplained death Brother   . Healthy Brother   . Healthy Son     ASSESSMENT Recent Results: The most recent result is correlated with 35 mg per week: Lab Results  Component Value Date   INR 3.1 (A) 12/18/2017   INR 2.1 12/04/2017   INR 1.14 11/24/2017    Anticoagulation Dosing: Description   Take  one (1) tablet of your 5mg  peach-colored warfarin tablets by mouth, once-daily at 6PM.        INR today: Supratherapeutic  PLAN Weekly dose was unchanged  Continue previous dosing as INR was just 3.1 and he reports no concerns related to bleeding and will trend INR with follow up visit prior to making a dose adjustment.  Patient Instructions  Patient instructed to take medications as defined in the Anti-coagulation Track section of this encounter.  Patient instructed to take today's dose.  Patient verbalized understanding of these instructions.  Patient was instructed to take one (1) tablet of your 5mg  peach-colored warfarin tablets by mouth, once-daily at [facility].     Patient advised to contact clinic or seek medical attention if signs/symptoms of bleeding or thromboembolism occur.  Patient verbalized understanding by repeating back information and was advised to contact me if further medication-related questions arise. Patient was also provided an information handout.  Follow-up Return in about 3 weeks (around 01/08/2018) for INR f/u @1030 .  Leticia Clas Tempie Gibeault  15 minutes spent face-to-face with the patient during the encounter. 50% of time spent on education. 50% of time was spent on collection of INR, interpretation of results, and documentation into [email or web address] and the electronic medical record.

## 2017-12-18 NOTE — Patient Instructions (Signed)
Patient instructed to take medications as defined in the Anti-coagulation Track section of this encounter.  Patient instructed to take today's dose.  Patient verbalized understanding of these instructions.  Patient was instructed to take one (1) tablet of your 5mg  peach-colored warfarin tablets by mouth, once-daily at 6PM.

## 2017-12-19 ENCOUNTER — Encounter: Payer: Self-pay | Admitting: Internal Medicine

## 2017-12-19 DIAGNOSIS — K644 Residual hemorrhoidal skin tags: Secondary | ICD-10-CM

## 2017-12-19 DIAGNOSIS — D369 Benign neoplasm, unspecified site: Secondary | ICD-10-CM

## 2017-12-19 DIAGNOSIS — K648 Other hemorrhoids: Secondary | ICD-10-CM

## 2017-12-19 HISTORY — DX: Other hemorrhoids: K64.4

## 2017-12-19 HISTORY — DX: Benign neoplasm, unspecified site: D36.9

## 2017-12-21 NOTE — Progress Notes (Signed)
I reviewed the anticoagulation note and agree with plan.

## 2018-01-06 ENCOUNTER — Other Ambulatory Visit: Payer: Self-pay | Admitting: Internal Medicine

## 2018-01-06 DIAGNOSIS — Z86711 Personal history of pulmonary embolism: Secondary | ICD-10-CM

## 2018-01-08 ENCOUNTER — Ambulatory Visit (INDEPENDENT_AMBULATORY_CARE_PROVIDER_SITE_OTHER): Payer: Medicare HMO | Admitting: Pharmacist

## 2018-01-08 DIAGNOSIS — Z5181 Encounter for therapeutic drug level monitoring: Secondary | ICD-10-CM

## 2018-01-08 DIAGNOSIS — Z86718 Personal history of other venous thrombosis and embolism: Secondary | ICD-10-CM

## 2018-01-08 DIAGNOSIS — Z86711 Personal history of pulmonary embolism: Secondary | ICD-10-CM

## 2018-01-08 DIAGNOSIS — Z7901 Long term (current) use of anticoagulants: Secondary | ICD-10-CM

## 2018-01-08 LAB — POCT INR: INR: 2.3 (ref 2.0–3.0)

## 2018-01-08 MED ORDER — WARFARIN SODIUM 5 MG PO TABS: 5.0000 mg | ORAL_TABLET | Freq: Every day | ORAL | 1 refills | Status: DC

## 2018-01-08 NOTE — Progress Notes (Signed)
Anticoagulation Management Alexander English is a 62 y.o. male who reports to the clinic for monitoring of warfarin treatment.    Indication: History of venous thromboembolism; long term current use of anticoagulants.   Duration: indefinite Supervising physician: Aldine Contes  Anticoagulation Clinic Visit History: Patient does not report signs/symptoms of bleeding or thromboembolism  Other recent changes: No diet, medications, lifestyle changes endorsed.  Anticoagulation Episode Summary    Current INR goal:   2.0-3.0  TTR:   83.1 % (5.8 y)  Next INR check:   02/05/2018  INR from last check:   2.3 (01/08/2018)  Weekly max warfarin dose:     Target end date:   Indefinite  INR check location:   Anticoagulation Clinic  Preferred lab:     Send INR reminders to:   ANTICOAG IMP   Indications   History of venous thromboembolism [Z86.718]       Comments:           No Known Allergies Prior to Admission medications   Medication Sig Start Date End Date Taking? Authorizing Provider  aspirin EC 81 MG tablet Take 81 mg by mouth daily.   Yes [provider]  atorvastatin (LIPITOR) 10 MG tablet Take 1 tablet (10 mg total) daily by mouth. 05/12/17  Yes [name], MD  fesoterodine (TOVIAZ) 4 MG TB24 tablet Take 1 tablet (4 mg total) by mouth daily. 11/06/17  Yes Rice, Resa Miner, MD  HYDROcodone-acetaminophen (HYCET) 7.5-325 mg/15 ml solution Take 15 mLs by mouth 4 (four) times daily as needed for moderate pain. 11/24/17  Yes [name], MD  mirabegron ER (MYRBETRIQ) 25 MG TB24 tablet Take 25 mg by mouth daily.    Yes [provider]  Multiple Vitamin (MULTIVITAMIN) tablet Take 1 tablet by mouth daily.   Yes [provider]  promethazine (PHENERGAN) 25 MG suppository Place 1 suppository (25 mg total) rectally every 6 (six) hours as needed for nausea or vomiting. 11/24/17  Yes [name], MD  sorbitol 70 % solution Take 15 mLs by mouth daily as needed.  03/31/17  Yes [name], MD  warfarin (COUMADIN) 5 MG tablet Take 1 tablet (5 mg total) by mouth daily at 6 PM. 01/08/18  Yes [name], RPH-CPP  zolpidem (AMBIEN) 5 MG tablet Take 1 tablet (5 mg total) by mouth at bedtime as needed for sleep. 10/16/17  Yes [name], MD  clindamycin (CLEOCIN) 300 MG capsule Take 1 capsule (300 mg total) by mouth 3 (three) times daily. Patient not taking: Reported on 01/08/2018 11/24/17   [name], MD   Past Medical History:  Diagnosis Date  . Aortic atherosclerosis ([place]) 10/23/2017   Asymptomatic, seen on CT scan  . Bilateral cataracts 05/18/2016   Not visually significant  . Chronic constipation 06/12/2013  . Chronic low back pain 06/12/2013   L4-5 facet arthropathy   . Chronic pain of both shoulders 12/07/2013   A/C joint osteoarthritis   . Cluster headaches    Resolved in 2006  . COPD (chronic obstructive pulmonary disease) ([place])   . Dry eye syndrome, bilateral 05/18/2016  . Embolic stroke involving right middle cerebral artery ([place]) 08/08/2004   Occured after traveling from [place] to [place] where he was hospitalized for 1 month with no further work-up or therapy.  Returned to [place] unable to walk and was admitted to [facility] immediately after landing. Presumed embolic per neuro but TEE, bubble study, and hypercoag W/U negative. On indefinite coumadin per patient's informed preference.  Residual effects : Left spastic hemiplegia. Tx with phenol tibi  . History of kidney stones   . Hyperplastic colon polyp 07/27/2012   Excised endoscopically 07/27/2012  . Hypertensive retinopathy of both eyes 05/18/2016  . Internal and external hemorrhoids without complication 02/06/7516   Seen on colonoscopy 04/03/2007.  Marland Kitchen Left nephrolithiasis 08/31/2015   With mild left hydronephrosis.  Scheduled to undergo shockwave lithotripsy.  . Obesity (BMI 30.0-34.9) 12/07/2013  . Osteoarthritis of both knees 01/26/2012  . Positive PPD 12/07/2013  . Pulmonary embolism ([place])  09/30/2004   Diagnosed in April 2006 after returning from [place].  Likely provoked as it occurred after a 12 hour plane flight within 2 months of R MCA CVA with left hemiparesis. Patient has made an informed decision to remain on lifelong warfarin.   . Tubular adenoma 12/19/2017   Endoscopically excised 04/03/2007.  Marland Kitchen Urge incontinence of urine 08/31/2015   Possibly secondary to prior CVA.  Treated with Myrbetriq.   Social History   Socioeconomic History  . Marital status: Divorced    Spouse name: Not on file  . Number of children: Not on file  . Years of education: Not on file  . Highest education level: Not on file  Occupational History  . Not on file  Social Needs  . Financial resource strain: Not on file  . Food insecurity:    Worry: Not on file    Inability: Not on file  . Transportation needs:    Medical: Not on file    Non-medical: Not on file  Tobacco Use  . Smoking status: Former Smoker    Last attempt to quit: 06/28/2007    Years since quitting: 10.5  . Smokeless tobacco: Never Used  Substance and Sexual Activity  . Alcohol use: No    Alcohol/week: 0.0 oz    Comment: Remote past  . Drug use: No  . Sexual activity: Not on file  Lifestyle  . Physical activity:    Days per week: Not on file    Minutes per session: Not on file  . Stress: Not on file  Relationships  . Social connections:    Talks on phone: Not on file    Gets together: Not on file    Attends religious service: Not on file    Active member of club or organization: Not on file    Attends meetings of clubs or organizations: Not on file    Relationship status: Not on file  Other Topics Concern  . Not on file  Social History Narrative   Born in [place] but has been in the [place] for > 30 years.  Divorced, 1 daughter and 2 sons.  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   Family History  Problem Relation Age of Onset  . Stroke Maternal Grandmother   .  Unexplained death Mother   . Depression Mother        After husband's death  . Unexplained death Father   . Osteoarthritis Father        Knees  . Early death Sister   . Seizures Sister   . Early death Brother 67       Unknown cause  . Healthy Daughter   . Healthy Son   . Stroke Maternal Aunt   . Healthy Sister   . Healthy Sister   . Unexplained death Brother   . Healthy Brother   . Healthy Son     ASSESSMENT  Recent Results: The most recent result is correlated with 35 mg per week: Lab Results  Component Value Date   INR 2.3 01/08/2018   INR 3.1 (A) 12/18/2017   INR 2.1 12/04/2017    Anticoagulation Dosing: Description   Take one (1) tablet of your 5mg  peach-colored warfarin tablets by mouth, once-daily at 6PM.        INR today: Therapeutic  PLAN Weekly dose was unchanged.  Patient Instructions  Patient instructed to take medications as defined in the Anti-coagulation Track section of this encounter.  Patient instructed to take today's dose.  Patient instructed to take  one (1) tablet of your 5mg  peach-colored warfarin tablets by mouth, once-daily at [facility].  Patient verbalized understanding of these instructions.     Patient advised to contact clinic or seek medical attention if signs/symptoms of bleeding or thromboembolism occur.  Patient verbalized understanding by repeating back information and was advised to contact me if further medication-related questions arise. Patient was also provided an information handout.  Follow-up Return in 1 month (on 02/05/2018) for Follow up INR at 1030h.  [name], PharmD, CCP  15 minutes spent face-to-face with the patient during the encounter. 50% of time spent on education. 50% of time was spent on fingerstick point of care INR sample collection, processing, results determination and documentation in [email or web address].

## 2018-01-08 NOTE — Patient Instructions (Signed)
Patient instructed to take medications as defined in the Anti-coagulation Track section of this encounter.  Patient instructed to take today's dose.  Patient instructed to take one (1) tablet of your 5mg peach-colored warfarin tablets by mouth, once-daily at 6PM. Patient verbalized understanding of these instructions.    

## 2018-01-08 NOTE — Progress Notes (Signed)
INTERNAL MEDICINE TEACHING ATTENDING ADDENDUM - Dalayla Aldredge M.D  Duration- indefinite, Indication- PE, INR- therapeutic. Agree with pharmacy recommendations as outlined in their note.     

## 2018-02-05 ENCOUNTER — Ambulatory Visit (INDEPENDENT_AMBULATORY_CARE_PROVIDER_SITE_OTHER): Payer: Medicare HMO | Admitting: Pharmacist

## 2018-02-05 DIAGNOSIS — Z86718 Personal history of other venous thrombosis and embolism: Secondary | ICD-10-CM | POA: Diagnosis not present

## 2018-02-05 DIAGNOSIS — Z7901 Long term (current) use of anticoagulants: Secondary | ICD-10-CM | POA: Diagnosis not present

## 2018-02-05 DIAGNOSIS — Z86711 Personal history of pulmonary embolism: Secondary | ICD-10-CM

## 2018-02-05 DIAGNOSIS — Z5181 Encounter for therapeutic drug level monitoring: Secondary | ICD-10-CM | POA: Diagnosis not present

## 2018-02-05 LAB — POCT INR: INR: 2.8 (ref 2.0–3.0)

## 2018-02-05 MED ORDER — WARFARIN SODIUM 5 MG PO TABS: ORAL_TABLET | ORAL | 1 refills | Status: DC

## 2018-02-05 NOTE — Patient Instructions (Signed)
Patient instructed to take medications as defined in the Anti-coagulation Track section of this encounter.  Patient instructed to take today's dose.  Patient instructed to take one (1) tablet of your 5mg  peach-colored warfarin tablets by mouth, once-daily at 6PM--EXCEPT on TUESDAYS. Take only 1/2 tablet on Tuesdays.  Patient verbalized understanding of these instructions.

## 2018-02-05 NOTE — Progress Notes (Signed)
Anticoagulation Management Alexander English is a 62 y.o. male who reports to the clinic for monitoring of warfarin treatment.    Indication: DVT, history of; PE, history of; long term current use of anticoagulants.  Duration: indefinite Supervising physician: Aldine Contes  Anticoagulation Clinic Visit History: Patient does not report signs/symptoms of bleeding or thromboembolism  Other recent changes: No diet, medications, lifestyle changes endorsed.  Anticoagulation Episode Summary    Current INR goal:   2.0-3.0  TTR:   83.3 % (5.9 y)  Next INR check:   03/05/2018  INR from last check:   2.8 (02/05/2018)  Weekly max warfarin dose:     Target end date:   Indefinite  INR check location:   Anticoagulation Clinic  Preferred lab:     Send INR reminders to:   [facility]   Indications   History of venous thromboembolism [Z86.718]       Comments:           No Known Allergies Prior to Admission medications   Medication Sig Start Date End Date Taking? Authorizing Provider  aspirin EC 81 MG tablet Take 81 mg by mouth daily.   Yes [provider]  atorvastatin (LIPITOR) 10 MG tablet Take 1 tablet (10 mg total) daily by mouth. 05/12/17  Yes [name], MD  clindamycin (CLEOCIN) 300 MG capsule Take 1 capsule (300 mg total) by mouth 3 (three) times daily. 11/24/17  Yes [name], MD  fesoterodine (TOVIAZ) 4 MG TB24 tablet Take 1 tablet (4 mg total) by mouth daily. 11/06/17  Yes Rice, Resa Miner, MD  HYDROcodone-acetaminophen (HYCET) 7.5-325 mg/15 ml solution Take 15 mLs by mouth 4 (four) times daily as needed for moderate pain. 11/24/17  Yes [name], MD  mirabegron ER (MYRBETRIQ) 25 MG TB24 tablet Take 25 mg by mouth daily.    Yes [provider]  Multiple Vitamin (MULTIVITAMIN) tablet Take 1 tablet by mouth daily.   Yes [provider]  promethazine (PHENERGAN) 25 MG suppository Place 1 suppository (25 mg total) rectally every 6 (six) hours as  needed for nausea or vomiting. 11/24/17  Yes [name], MD  sorbitol 70 % solution Take 15 mLs by mouth daily as needed. 03/31/17  Yes [name], MD  warfarin (COUMADIN) 5 MG tablet Take one tablet by mouth daily--EXCEPT on Tuesdays, take only 1/2 tablet. 02/05/18  Yes [name], RPH-CPP  zolpidem (AMBIEN) 5 MG tablet Take 1 tablet (5 mg total) by mouth at bedtime as needed for sleep. 10/16/17  Yes [name], MD   Past Medical History:  Diagnosis Date  . Aortic atherosclerosis ([place]) 10/23/2017   Asymptomatic, seen on CT scan  . Bilateral cataracts 05/18/2016   Not visually significant  . Chronic constipation 06/12/2013  . Chronic low back pain 06/12/2013   L4-5 facet arthropathy   . Chronic pain of both shoulders 12/07/2013   A/C joint osteoarthritis   . Cluster headaches    Resolved in 2006  . COPD (chronic obstructive pulmonary disease) ([place])   . Dry eye syndrome, bilateral 05/18/2016  . Embolic stroke involving right middle cerebral artery ([place]) 08/08/2004   Occured after traveling from [place] to [place] where he was hospitalized for 1 month with no further work-up or therapy.  Returned to [place] unable to walk and was admitted to [facility] immediately after landing. Presumed embolic per neuro but TEE, bubble study, and hypercoag W/U negative. On indefinite coumadin per patient's informed preference.  Residual effects : Left spastic  hemiplegia. Tx with phenol tibi  . History of kidney stones   . Hyperplastic colon polyp 07/27/2012   Excised endoscopically 07/27/2012  . Hypertensive retinopathy of both eyes 05/18/2016  . Internal and external hemorrhoids without complication 9/83/3825   Seen on colonoscopy 04/03/2007.  Marland Kitchen Left nephrolithiasis 08/31/2015   With mild left hydronephrosis.  Scheduled to undergo shockwave lithotripsy.  . Obesity (BMI 30.0-34.9) 12/07/2013  . Osteoarthritis of both knees 01/26/2012  . Positive PPD 12/07/2013  . Pulmonary embolism ([place]) 09/30/2004   Diagnosed in  April 2006 after returning from [place].  Likely provoked as it occurred after a 12 hour plane flight within 2 months of R MCA CVA with left hemiparesis. Patient has made an informed decision to remain on lifelong warfarin.   . Tubular adenoma 12/19/2017   Endoscopically excised 04/03/2007.  Marland Kitchen Urge incontinence of urine 08/31/2015   Possibly secondary to prior CVA.  Treated with Myrbetriq.   Social History   Socioeconomic History  . Marital status: Divorced    Spouse name: Not on file  . Number of children: Not on file  . Years of education: Not on file  . Highest education level: Not on file  Occupational History  . Not on file  Social Needs  . Financial resource strain: Not on file  . Food insecurity:    Worry: Not on file    Inability: Not on file  . Transportation needs:    Medical: Not on file    Non-medical: Not on file  Tobacco Use  . Smoking status: Former Smoker    Last attempt to quit: 06/28/2007    Years since quitting: 10.6  . Smokeless tobacco: Never Used  Substance and Sexual Activity  . Alcohol use: No    Alcohol/week: 0.0 standard drinks    Comment: Remote past  . Drug use: No  . Sexual activity: Not on file  Lifestyle  . Physical activity:    Days per week: Not on file    Minutes per session: Not on file  . Stress: Not on file  Relationships  . Social connections:    Talks on phone: Not on file    Gets together: Not on file    Attends religious service: Not on file    Active member of club or organization: Not on file    Attends meetings of clubs or organizations: Not on file    Relationship status: Not on file  Other Topics Concern  . Not on file  Social History Narrative   Born in [place] but has been in the [place] for > 30 years.  Divorced, 1 daughter and 2 sons.  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   Family History  Problem Relation Age of Onset  . Stroke Maternal Grandmother   . Unexplained death  Mother   . Depression Mother        After husband's death  . Unexplained death Father   . Osteoarthritis Father        Knees  . Early death Sister   . Seizures Sister   . Early death Brother 19       Unknown cause  . Healthy Daughter   . Healthy Son   . Stroke Maternal Aunt   . Healthy Sister   . Healthy Sister   . Unexplained death Brother   . Healthy Brother   . Healthy Son     ASSESSMENT Recent Results: The most  recent result is correlated with 35 mg per week: Lab Results  Component Value Date   INR 2.8 02/05/2018   INR 2.3 01/08/2018   INR 3.1 (A) 12/18/2017    Anticoagulation Dosing: Description   Take one (1) tablet of your 5mg  peach-colored warfarin tablets by mouth, once-daily at 6PM--EXCEPT on TUESDAYS. Take only 1/2 tablet on Tuesdays.        INR today: Therapeutic  PLAN Weekly dose was decreased by 7% to 32.5 mg per week  Patient Instructions  Patient instructed to take medications as defined in the Anti-coagulation Track section of this encounter.  Patient instructed to take today's dose.  Patient instructed to take one (1) tablet of your 5mg  peach-colored warfarin tablets by mouth, once-daily at 6PM--EXCEPT on TUESDAYS. Take only 1/2 tablet on Tuesdays.  Patient verbalized understanding of these instructions.     Patient advised to contact clinic or seek medical attention if signs/symptoms of bleeding or thromboembolism occur.  Patient verbalized understanding by repeating back information and was advised to contact me if further medication-related questions arise. Patient was also provided an information handout.  Follow-up Return in 4 weeks (on 03/05/2018) for Follow up INR at 1030h.  [name], PharmD, CPP  15 minutes spent face-to-face with the patient during the encounter. 50% of time spent on education. 50% of time was spent on fingerstick point of care INR sample collection, processing, results determination, dose adjustment and  documentation in [email or web address].

## 2018-02-07 NOTE — Progress Notes (Signed)
INTERNAL MEDICINE TEACHING ATTENDING ADDENDUM - Waylan Busta M.D  Duration- indefinite, Indication- PE, INR- therapeutic. Agree with pharmacy recommendations as outlined in their note.     

## 2018-02-09 DIAGNOSIS — H04123 Dry eye syndrome of bilateral lacrimal glands: Secondary | ICD-10-CM | POA: Diagnosis not present

## 2018-02-09 DIAGNOSIS — H2513 Age-related nuclear cataract, bilateral: Secondary | ICD-10-CM | POA: Diagnosis not present

## 2018-02-09 DIAGNOSIS — H40023 Open angle with borderline findings, high risk, bilateral: Secondary | ICD-10-CM | POA: Diagnosis not present

## 2018-02-09 DIAGNOSIS — H35033 Hypertensive retinopathy, bilateral: Secondary | ICD-10-CM | POA: Diagnosis not present

## 2018-02-23 DIAGNOSIS — R972 Elevated prostate specific antigen [PSA]: Secondary | ICD-10-CM | POA: Diagnosis not present

## 2018-02-28 DIAGNOSIS — R3915 Urgency of urination: Secondary | ICD-10-CM | POA: Diagnosis not present

## 2018-02-28 DIAGNOSIS — R972 Elevated prostate specific antigen [PSA]: Secondary | ICD-10-CM | POA: Diagnosis not present

## 2018-02-28 DIAGNOSIS — N401 Enlarged prostate with lower urinary tract symptoms: Secondary | ICD-10-CM | POA: Diagnosis not present

## 2018-03-05 ENCOUNTER — Ambulatory Visit (INDEPENDENT_AMBULATORY_CARE_PROVIDER_SITE_OTHER): Payer: Medicare HMO | Admitting: Pharmacist

## 2018-03-05 DIAGNOSIS — Z86718 Personal history of other venous thrombosis and embolism: Secondary | ICD-10-CM

## 2018-03-05 DIAGNOSIS — Z5181 Encounter for therapeutic drug level monitoring: Secondary | ICD-10-CM | POA: Diagnosis not present

## 2018-03-05 DIAGNOSIS — Z7901 Long term (current) use of anticoagulants: Secondary | ICD-10-CM | POA: Diagnosis not present

## 2018-03-05 LAB — POCT INR: INR: 3.1 — AB (ref 2.0–3.0)

## 2018-03-05 NOTE — Progress Notes (Signed)
Anticoagulation Management Alexander English is a 62 y.o. male who reports to the clinic for monitoring of warfarin treatment.    Indication: history of venous thromboembolism and long-term (current) anticoagulant use. Duration: indefinite Supervising physician: [name] MD  Anticoagulation Clinic Visit History: Patient does not report signs/symptoms of bleeding or thromboembolism at this time. Other recent changes: No diet, medications, lifestyle changes endorsed. Anticoagulation Episode Summary    Current INR goal:   2.0-3.0  TTR:   83.1 % (5.9 y)  Next INR check:   03/16/2018  INR from last check:   3.1! (03/05/2018)  Weekly max warfarin dose:     Target end date:   Indefinite  INR check location:   Anticoagulation Clinic  Preferred lab:     Send INR reminders to:   [facility]   Indications   History of venous thromboembolism [Z86.718]       Comments:           No Known Allergies Prior to Admission medications   Medication Sig Start Date End Date Taking? Authorizing Provider  aspirin EC 81 MG tablet Take 81 mg by mouth daily.   Yes [provider]  atorvastatin (LIPITOR) 10 MG tablet Take 1 tablet (10 mg total) daily by mouth. 05/12/17  Yes [name], MD  clindamycin (CLEOCIN) 300 MG capsule Take 1 capsule (300 mg total) by mouth 3 (three) times daily. 11/24/17  Yes [name], MD  fesoterodine (TOVIAZ) 4 MG TB24 tablet Take 1 tablet (4 mg total) by mouth daily. 11/06/17  Yes Rice, Resa Miner, MD  HYDROcodone-acetaminophen (HYCET) 7.5-325 mg/15 ml solution Take 15 mLs by mouth 4 (four) times daily as needed for moderate pain. 11/24/17  Yes [name], MD  mirabegron ER (MYRBETRIQ) 25 MG TB24 tablet Take 25 mg by mouth daily.    Yes [provider]  Multiple Vitamin (MULTIVITAMIN) tablet Take 1 tablet by mouth daily.   Yes [provider]  promethazine (PHENERGAN) 25 MG suppository Place 1 suppository (25 mg total) rectally  every 6 (six) hours as needed for nausea or vomiting. 11/24/17  Yes [name], MD  sorbitol 70 % solution Take 15 mLs by mouth daily as needed. 03/31/17  Yes [name], MD  warfarin (COUMADIN) 5 MG tablet Take one tablet by mouth daily--EXCEPT on Tuesdays, take only 1/2 tablet. 02/05/18  Yes [name], RPH-CPP  zolpidem (AMBIEN) 5 MG tablet Take 1 tablet (5 mg total) by mouth at bedtime as needed for sleep. 10/16/17  Yes [name], MD   Past Medical History:  Diagnosis Date  . Aortic atherosclerosis ([place]) 10/23/2017   Asymptomatic, seen on CT scan  . Bilateral cataracts 05/18/2016   Not visually significant  . Chronic constipation 06/12/2013  . Chronic low back pain 06/12/2013   L4-5 facet arthropathy   . Chronic pain of both shoulders 12/07/2013   A/C joint osteoarthritis   . Cluster headaches    Resolved in 2006  . COPD (chronic obstructive pulmonary disease) ([place])   . Dry eye syndrome, bilateral 05/18/2016  . Embolic stroke involving right middle cerebral artery ([place]) 08/08/2004   Occured after traveling from [place] to [place] where he was hospitalized for 1 month with no further work-up or therapy.  Returned to [place] unable to walk and was admitted to [facility] immediately after landing. Presumed embolic per neuro but TEE, bubble study, and hypercoag W/U negative. On indefinite coumadin per patient's informed preference.  Residual effects : Left spastic hemiplegia. Tx  with phenol tibi  . History of kidney stones   . Hyperplastic colon polyp 07/27/2012   Excised endoscopically 07/27/2012  . Hypertensive retinopathy of both eyes 05/18/2016  . Internal and external hemorrhoids without complication 2/99/3716   Seen on colonoscopy 04/03/2007.  Marland Kitchen Left nephrolithiasis 08/31/2015   With mild left hydronephrosis.  Scheduled to undergo shockwave lithotripsy.  . Obesity (BMI 30.0-34.9) 12/07/2013  . Osteoarthritis of both knees 01/26/2012  . Positive PPD 12/07/2013  . Pulmonary embolism ([place])  09/30/2004   Diagnosed in April 2006 after returning from [place].  Likely provoked as it occurred after a 12 hour plane flight within 2 months of R MCA CVA with left hemiparesis. Patient has made an informed decision to remain on lifelong warfarin.   . Tubular adenoma 12/19/2017   Endoscopically excised 04/03/2007.  Marland Kitchen Urge incontinence of urine 08/31/2015   Possibly secondary to prior CVA.  Treated with Myrbetriq.   Social History   Socioeconomic History  . Marital status: Divorced    Spouse name: Not on file  . Number of children: Not on file  . Years of education: Not on file  . Highest education level: Not on file  Occupational History  . Not on file  Social Needs  . Financial resource strain: Not on file  . Food insecurity:    Worry: Not on file    Inability: Not on file  . Transportation needs:    Medical: Not on file    Non-medical: Not on file  Tobacco Use  . Smoking status: Former Smoker    Last attempt to quit: 06/28/2007    Years since quitting: 10.6  . Smokeless tobacco: Never Used  Substance and Sexual Activity  . Alcohol use: No    Alcohol/week: 0.0 standard drinks    Comment: Remote past  . Drug use: No  . Sexual activity: Not on file  Lifestyle  . Physical activity:    Days per week: Not on file    Minutes per session: Not on file  . Stress: Not on file  Relationships  . Social connections:    Talks on phone: Not on file    Gets together: Not on file    Attends religious service: Not on file    Active member of club or organization: Not on file    Attends meetings of clubs or organizations: Not on file    Relationship status: Not on file  Other Topics Concern  . Not on file  Social History Narrative   Born in [place] but has been in the [place] for > 30 years.  Divorced, 1 daughter and 2 sons.  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   Family History  Problem Relation Age of Onset  . Stroke Maternal Grandmother    . Unexplained death Mother   . Depression Mother        After husband's death  . Unexplained death Father   . Osteoarthritis Father        Knees  . Early death Sister   . Seizures Sister   . Early death Brother 52       Unknown cause  . Healthy Daughter   . Healthy Son   . Stroke Maternal Aunt   . Healthy Sister   . Healthy Sister   . Unexplained death Brother   . Healthy Brother   . Healthy Son     ASSESSMENT Recent Results: The most recent result  is correlated with 32.5 mg per week: Lab Results  Component Value Date   INR 3.1 (A) 03/05/2018   INR 2.8 02/05/2018   INR 2.3 01/08/2018    Anticoagulation Dosing: Description   Take one (1) tablet of your 5mg  peach-colored warfarin tablets by mouth, once-daily at 6PM--EXCEPT on [place]. Take only 1/2 tablet on Monday and Thursday.        INR today: Supratherapeutic  PLAN Weekly dose was decreased by 7.7% to 30 mg per week  Patient Instructions  Patient instructed to take medications as defined in the Anti-coagulation Track section of this encounter.  Patient instructed to take today's dose of 1/2 tablet (2.5 mg).  Patient verbalized understanding of these instructions.  Patient instructed to take one (1) tablet of his 5mg  peach-colored warfarin tablets by mouth, once-daily at 6PM--EXCEPT on [place]. Take only 1/2 tablet on Monday and Thursday.    Patient advised to contact clinic or seek medical attention if signs/symptoms of bleeding or thromboembolism occur.  Patient verbalized understanding by repeating back information and was advised to contact me if further medication-related questions arise. Patient was also provided an information handout.  Follow-up Return in 11 days (on 03/16/2018) for Follow up INR on 9/20 at 10:30 AM.  Britt Boozer, PharmD PGY1 Pharmacy Resident  15 minutes spent face-to-face with the patient during the encounter. 50% of time spent on education. 50%  of time was spent on point of care INR testing, interpreting results, dose adjustments, patient counseling, documentation in [email or web address]

## 2018-03-05 NOTE — Patient Instructions (Signed)
Patient instructed to take medications as defined in the Anti-coagulation Track section of this encounter.  Patient instructed to take today's dose of 1/2 tablet (2.5 mg).  Patient verbalized understanding of these instructions.  Patient instructed to take one (1) tablet of his 5mg  peach-colored warfarin tablets by mouth, once-daily at 6PM--EXCEPT on [place]. Take only 1/2 tablet on Monday and Thursday.

## 2018-03-06 DIAGNOSIS — C310 Malignant neoplasm of maxillary sinus: Secondary | ICD-10-CM | POA: Diagnosis not present

## 2018-03-06 NOTE — Progress Notes (Signed)
INTERNAL MEDICINE TEACHING ATTENDING ADDENDUM  I agree with pharmacy recommendations as outlined in their note.   Alexander N Raines, MD  

## 2018-03-16 ENCOUNTER — Telehealth: Payer: Self-pay | Admitting: Internal Medicine

## 2018-03-16 ENCOUNTER — Encounter: Payer: Self-pay | Admitting: Internal Medicine

## 2018-03-16 ENCOUNTER — Ambulatory Visit (INDEPENDENT_AMBULATORY_CARE_PROVIDER_SITE_OTHER): Payer: Medicare HMO | Admitting: Internal Medicine

## 2018-03-16 ENCOUNTER — Ambulatory Visit (INDEPENDENT_AMBULATORY_CARE_PROVIDER_SITE_OTHER): Payer: Medicare HMO | Admitting: Pharmacist

## 2018-03-16 VITALS — BP 133/74 | HR 75 | Temp 98.2°F | Wt 225.1 lb

## 2018-03-16 DIAGNOSIS — M545 Low back pain, unspecified: Secondary | ICD-10-CM

## 2018-03-16 DIAGNOSIS — N3941 Urge incontinence: Secondary | ICD-10-CM

## 2018-03-16 DIAGNOSIS — E7800 Pure hypercholesterolemia, unspecified: Secondary | ICD-10-CM

## 2018-03-16 DIAGNOSIS — Z86718 Personal history of other venous thrombosis and embolism: Secondary | ICD-10-CM

## 2018-03-16 DIAGNOSIS — E785 Hyperlipidemia, unspecified: Secondary | ICD-10-CM

## 2018-03-16 DIAGNOSIS — M170 Bilateral primary osteoarthritis of knee: Secondary | ICD-10-CM | POA: Diagnosis not present

## 2018-03-16 DIAGNOSIS — Z86018 Personal history of other benign neoplasm: Secondary | ICD-10-CM

## 2018-03-16 DIAGNOSIS — Z5181 Encounter for therapeutic drug level monitoring: Secondary | ICD-10-CM

## 2018-03-16 DIAGNOSIS — K5909 Other constipation: Secondary | ICD-10-CM | POA: Diagnosis not present

## 2018-03-16 DIAGNOSIS — Z23 Encounter for immunization: Secondary | ICD-10-CM | POA: Diagnosis not present

## 2018-03-16 DIAGNOSIS — I69398 Other sequelae of cerebral infarction: Secondary | ICD-10-CM

## 2018-03-16 DIAGNOSIS — Z7901 Long term (current) use of anticoagulants: Secondary | ICD-10-CM | POA: Diagnosis not present

## 2018-03-16 DIAGNOSIS — Z79891 Long term (current) use of opiate analgesic: Secondary | ICD-10-CM

## 2018-03-16 DIAGNOSIS — D369 Benign neoplasm, unspecified site: Secondary | ICD-10-CM

## 2018-03-16 DIAGNOSIS — M21372 Foot drop, left foot: Secondary | ICD-10-CM

## 2018-03-16 DIAGNOSIS — C310 Malignant neoplasm of maxillary sinus: Secondary | ICD-10-CM | POA: Diagnosis not present

## 2018-03-16 DIAGNOSIS — Z483 Aftercare following surgery for neoplasm: Secondary | ICD-10-CM

## 2018-03-16 DIAGNOSIS — G8929 Other chronic pain: Secondary | ICD-10-CM

## 2018-03-16 DIAGNOSIS — R2689 Other abnormalities of gait and mobility: Secondary | ICD-10-CM

## 2018-03-16 DIAGNOSIS — Z978 Presence of other specified devices: Secondary | ICD-10-CM

## 2018-03-16 DIAGNOSIS — Z79899 Other long term (current) drug therapy: Secondary | ICD-10-CM

## 2018-03-16 DIAGNOSIS — I69354 Hemiplegia and hemiparesis following cerebral infarction affecting left non-dominant side: Secondary | ICD-10-CM

## 2018-03-16 DIAGNOSIS — I6930 Unspecified sequelae of cerebral infarction: Secondary | ICD-10-CM

## 2018-03-16 DIAGNOSIS — M172 Bilateral post-traumatic osteoarthritis of knee: Secondary | ICD-10-CM

## 2018-03-16 LAB — POCT INR: INR: 2.4 (ref 2.0–3.0)

## 2018-03-16 MED ORDER — FESOTERODINE FUMARATE ER 8 MG PO TB24: 8.0000 mg | ORAL_TABLET | Freq: Every day | ORAL | 3 refills | Status: DC

## 2018-03-16 NOTE — Assessment & Plan Note (Signed)
Assessment  He continues to have dense paralysis of the left upper and lower extremities with foot drop.  This results in a very abnormal gait which leads to the development of osteoarthritic pain at other sites including the back, hips, and right knee.  He is using his brace that extends past his knee, but he is afraid of failure of the brace and therefore has not been walking about the neighborhood as he had previously.  He states the pain is slightly better in his knees and back than at the last visit, but about once a week it is severe enough that he will require an oxycodone pill.  He is unsure if the Voltaren gel has been helpful.  Plan  We will continue the Voltaren gel and as needed oxycodone for the arthritic pain.  He was encouraged to continue to use his brace to avoid significant hyperextension of the left knee.  He requested that a referral to [facility] on [address] be made so that a left AFO prosthesis could be refitted.  Although not recommended, he prefers the AFO brace for ambulation in the house so that there is no foot drop.  This referral was made.  We also discussed the issue of hydrotherapy.  The braces are not waterproof and are not to be used in the pool.  That said, hydrotherapy in the pool is usually done without the braces and this could be considered.  I will discuss this with the patient at the follow-up visit.

## 2018-03-16 NOTE — Patient Instructions (Signed)
Patient instructed to take medications as defined in the Anti-coagulation Track section of this encounter.  Patient instructed to take today's dose. Patient instructed to take  one (1) tablet of your 5mg peach-colored warfarin tablets by mouth, once-daily at 6PM--EXCEPT on MONDAYS AND THURSDAYS. Take only 1/2 tablet on Monday and Thursday. Patient verbalized understanding of these instructions.    

## 2018-03-16 NOTE — Assessment & Plan Note (Signed)
Assessment  He has a history of a tubular adenoma which was endoscopically excised on April 03, 2007.  A subsequent surveillance colonoscopy on July 27, 2012 failed to reveal any further polyps.  Plan  It is time for his every 5-year surveillance colonoscopy to assess for recurrence of his tubular adenoma.  A referral to [facility] was placed for colonoscopy.

## 2018-03-16 NOTE — Telephone Encounter (Signed)
Call placed to patient to clarify. States the syringe for sorbitol solution is 10 mL and he takes 2 syringes (20 mL total). Wants to know if he can take more for his constipation. Hubbard Hartshorn, RN, BSN

## 2018-03-16 NOTE — Telephone Encounter (Signed)
I called [name] to let him know I would recommend three syringes to see if that is effective.  If he had to, he was informed he could go as high as 4 1/2 syringes (45 ml) daily, but I wanted him to increase the dose slowly so as not to cause profuse diarrhea.

## 2018-03-16 NOTE — Telephone Encounter (Signed)
Pt wanted to let Dr Eppie Gibson know the syringe 72mm, he wanted to know if he could double up medicine; pt contact# [phone]

## 2018-03-16 NOTE — Assessment & Plan Note (Signed)
Assessment  He has chronic constipation likely secondary to the occasional oxycodone tablet he requires as well as relative inactivity.  The sorbitol has been effective in the past although he has had to double the dose in order to achieve the same outcome.  He is unsure actually what doubling the dose means and I have been unable to figure it out per his description today.  Plan  Continue with the sorbitol at the current dose and call once he gets home with the amount that he uses on a daily basis.  With this information I will be able to tell him if he can increase the dose any further to titrate to the desired frequency of bowel movements.

## 2018-03-16 NOTE — Assessment & Plan Note (Signed)
Assessment  He is tolerating the atorvastatin 10 mg by mouth daily without myalgias.  Plan  We will continue with this moderate intensity statin and reassess for intolerances at the follow-up visit.

## 2018-03-16 NOTE — Patient Instructions (Addendum)
It was great to see you again!  Congrats on no cancer!  1) I will place a request for a new brace for your left knee.  2) I will send a request for a colonoscopy.  3) Work on exercise slowly to get your confidence.  4) Call me with the reading on the cup for the sorbitol.  5) Keep taking the medications as you are.  I would like to see you back in 3 months, sooner if necessary.

## 2018-03-16 NOTE — Assessment & Plan Note (Signed)
Assessment  He underwent excision of his left maxillary sinus mass that previously was shown to have a poorly differentiated squamous cell carcinoma.  Fortunately, all of the tissue removed from the left maxillary sinus failed to reveal any residual evidence of malignancy.  He was confused about what this meant and we spent considerable time reviewing the pathology reports and discussing the implications of the lack of any further tumor after his late May 2019 surgery.  Plan  He was much more comfortable with the results of the May surgery and was thrilled after an explanation was provided.  He admits that his ENT physician stated that there was no further intervention that was necessary at this point.  We will follow symptomatically in case there is a return of any sinus symptoms or epistaxis.

## 2018-03-16 NOTE — Assessment & Plan Note (Signed)
Assessment  He prefers to remain on indefinite anticoagulation.  He is being followed closely in the anticoagulation clinic while taking warfarin.  His INR today was 2.4.  This is at target.  He is having no bleeding.  Plan  We will continue the warfarin given the history of venous thromboembolism and the patient preference to remain on this indefinitely.  He will continue to follow closely with anticoagulation clinic in order to maintain an INR between 2 and 3.

## 2018-03-16 NOTE — Progress Notes (Signed)
   Subjective:    Patient ID: Alexander English, male    DOB: 10-29-1955, 62 y.o.   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 Please see the A&P for the status of the pt's chronic medical problems.  Review of Systems  Constitutional: Positive for activity change. Negative for unexpected weight change.       Less active than previously because of fears associated with possible brace malfunction.  Respiratory: Negative for chest tightness, shortness of breath and wheezing.   Cardiovascular: Negative for chest pain, palpitations and leg swelling.  Gastrointestinal: Positive for constipation. Negative for abdominal distention, abdominal pain, diarrhea, nausea and vomiting.       Still having constipation despite the sorbitol although there is confusion about dosing.  Musculoskeletal: Positive for arthralgias, back pain and gait problem. Negative for joint swelling and myalgias.       Continued pain in the knees and back slightly improved from the last visit.  Skin: Negative for rash and wound.  Neurological: Positive for weakness.       Residual left upper and lower extremity weakness with foot drop.      Objective:   Physical Exam  Constitutional: He is oriented to person, place, and time. He appears well-developed and well-nourished. No distress.  HENT:  Head: Normocephalic and atraumatic.  Eyes: Conjunctivae are normal. Right eye exhibits no discharge. Left eye exhibits no discharge. No scleral icterus.  Pulmonary/Chest: Effort normal. No stridor. No respiratory distress.  Musculoskeletal: He exhibits deformity. He exhibits no edema.  Left knee hyperextends with ambulation (brace is in place).    Neurological: He is alert and oriented to person, place, and time. He displays atrophy. He exhibits abnormal muscle tone. Coordination and gait abnormal.  Skin: Skin is warm and dry. No rash noted. He is not diaphoretic. No erythema.  Psychiatric: He has a normal mood and affect. His behavior is normal. Judgment and thought content normal.      Assessment & Plan:   Please see problem based charting.

## 2018-03-16 NOTE — Progress Notes (Signed)
Anticoagulation Management Alexander English is a 62 y.o. male who reports to the clinic for monitoring of warfarin treatment.    Indication: DVT, history of; long term current use of anticoagulant.   Duration: indefinite Supervising physician: [name]  Anticoagulation Clinic Visit History: Patient does not report signs/symptoms of bleeding or thromboembolism  Other recent changes: No diet, medications, lifestyle changes endorsed at this visit.  Anticoagulation Episode Summary    Current INR goal:   2.0-3.0  TTR:   83.1 % (6 y)  Next INR check:   04/16/2018  INR from last check:   2.4 (03/16/2018)  Weekly max warfarin dose:     Target end date:   Indefinite  INR check location:   Anticoagulation Clinic  Preferred lab:     Send INR reminders to:   [facility]   Indications   History of venous thromboembolism [Z86.718]       Comments:           No Known Allergies Prior to Admission medications   Medication Sig Start Date End Date Taking? Authorizing Provider  aspirin EC 81 MG tablet Take 81 mg by mouth daily.   Yes [provider]  atorvastatin (LIPITOR) 10 MG tablet Take 1 tablet (10 mg total) daily by mouth. 05/12/17  Yes [name], MD  clindamycin (CLEOCIN) 300 MG capsule Take 1 capsule (300 mg total) by mouth 3 (three) times daily. 11/24/17  Yes [name], MD  fesoterodine (TOVIAZ) 4 MG TB24 tablet Take 1 tablet (4 mg total) by mouth daily. 11/06/17  Yes Rice, Resa Miner, MD  HYDROcodone-acetaminophen (HYCET) 7.5-325 mg/15 ml solution Take 15 mLs by mouth 4 (four) times daily as needed for moderate pain. 11/24/17  Yes [name], MD  mirabegron ER (MYRBETRIQ) 25 MG TB24 tablet Take 25 mg by mouth daily.    Yes [provider]  Multiple Vitamin (MULTIVITAMIN) tablet Take 1 tablet by mouth daily.   Yes [provider]  promethazine (PHENERGAN) 25 MG suppository Place 1 suppository (25 mg total) rectally every 6 (six) hours as  needed for nausea or vomiting. 11/24/17  Yes [name], MD  sorbitol 70 % solution Take 15 mLs by mouth daily as needed. 03/31/17  Yes [name], MD  warfarin (COUMADIN) 5 MG tablet Take one tablet by mouth daily--EXCEPT on Tuesdays, take only 1/2 tablet. 02/05/18  Yes [name], RPH-CPP  zolpidem (AMBIEN) 5 MG tablet Take 1 tablet (5 mg total) by mouth at bedtime as needed for sleep. 10/16/17  Yes [name], MD   Past Medical History:  Diagnosis Date  . Aortic atherosclerosis ([place]) 10/23/2017   Asymptomatic, seen on CT scan  . Bilateral cataracts 05/18/2016   Not visually significant  . Chronic constipation 06/12/2013  . Chronic low back pain 06/12/2013   L4-5 facet arthropathy   . Chronic pain of both shoulders 12/07/2013   A/C joint osteoarthritis   . Cluster headaches    Resolved in 2006  . COPD (chronic obstructive pulmonary disease) ([place])   . Dry eye syndrome, bilateral 05/18/2016  . Embolic stroke involving right middle cerebral artery ([place]) 08/08/2004   Occured after traveling from [place] to [place] where he was hospitalized for 1 month with no further work-up or therapy.  Returned to [place] unable to walk and was admitted to [facility] immediately after landing. Presumed embolic per neuro but TEE, bubble study, and hypercoag W/U negative. On indefinite coumadin per patient's informed preference.  Residual effects : Left  spastic hemiplegia. Tx with phenol tibi  . History of kidney stones   . Hyperplastic colon polyp 07/27/2012   Excised endoscopically 07/27/2012  . Hypertensive retinopathy of both eyes 05/18/2016  . Internal and external hemorrhoids without complication 2/94/7654   Seen on colonoscopy 04/03/2007.  Marland Kitchen Left nephrolithiasis 08/31/2015   With mild left hydronephrosis.  Scheduled to undergo shockwave lithotripsy.  . Obesity (BMI 30.0-34.9) 12/07/2013  . Osteoarthritis of both knees 01/26/2012  . Positive PPD 12/07/2013  . Pulmonary embolism ([place]) 09/30/2004   Diagnosed in  April 2006 after returning from [place].  Likely provoked as it occurred after a 12 hour plane flight within 2 months of R MCA CVA with left hemiparesis. Patient has made an informed decision to remain on lifelong warfarin.   . Tubular adenoma 12/19/2017   Endoscopically excised 04/03/2007.  Marland Kitchen Urge incontinence of urine 08/31/2015   Possibly secondary to prior CVA.  Treated with Myrbetriq.   Social History   Socioeconomic History  . Marital status: Divorced    Spouse name: Not on file  . Number of children: Not on file  . Years of education: Not on file  . Highest education level: Not on file  Occupational History  . Not on file  Social Needs  . Financial resource strain: Not on file  . Food insecurity:    Worry: Not on file    Inability: Not on file  . Transportation needs:    Medical: Not on file    Non-medical: Not on file  Tobacco Use  . Smoking status: Former Smoker    Last attempt to quit: 06/28/2007    Years since quitting: 10.7  . Smokeless tobacco: Never Used  Substance and Sexual Activity  . Alcohol use: No    Alcohol/week: 0.0 standard drinks    Comment: Remote past  . Drug use: No  . Sexual activity: Not on file  Lifestyle  . Physical activity:    Days per week: Not on file    Minutes per session: Not on file  . Stress: Not on file  Relationships  . Social connections:    Talks on phone: Not on file    Gets together: Not on file    Attends religious service: Not on file    Active member of club or organization: Not on file    Attends meetings of clubs or organizations: Not on file    Relationship status: Not on file  Other Topics Concern  . Not on file  Social History Narrative   Born in [place] but has been in the [place] for > 30 years.  Divorced, 1 daughter and 2 sons.  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   Family History  Problem Relation Age of Onset  . Stroke Maternal Grandmother   . Unexplained death  Mother   . Depression Mother        After husband's death  . Unexplained death Father   . Osteoarthritis Father        Knees  . Early death Sister   . Seizures Sister   . Early death Brother 43       Unknown cause  . Healthy Daughter   . Healthy Son   . Stroke Maternal Aunt   . Healthy Sister   . Healthy Sister   . Unexplained death Brother   . Healthy Brother   . Healthy Son     ASSESSMENT Recent Results: The  most recent result is correlated with 30 mg per week: Lab Results  Component Value Date   INR 2.4 03/16/2018   INR 3.1 (A) 03/05/2018   INR 2.8 02/05/2018    Anticoagulation Dosing: Description   Take one (1) tablet of your 5mg  peach-colored warfarin tablets by mouth, once-daily at 6PM--EXCEPT on [place]. Take only 1/2 tablet on Monday and Thursday.        INR today: Therapeutic  PLAN Weekly dose was unchanged.   Patient Instructions  Patient instructed to take medications as defined in the Anti-coagulation Track section of this encounter.  Patient instructed to take today's dose.  Patient instructed to take one (1) tablet of your 5mg  peach-colored warfarin tablets by mouth, once-daily at 6PM--EXCEPT on [place]. Take only 1/2 tablet on Monday and Thursday. Patient verbalized understanding of these instructions.     Patient advised to contact clinic or seek medical attention if signs/symptoms of bleeding or thromboembolism occur.  Patient verbalized understanding by repeating back information and was advised to contact me if further medication-related questions arise. Patient was also provided an information handout.  Follow-up Return in 31 days (on 04/16/2018) for Follow up INR at 1030h.  [name], PharmD, CACP, CPP  15 minutes spent face-to-face with the patient during the encounter. 50% of time spent on education. 50% of time was spent on fingerstick point of care INR sample collection, processing, results  determination, and documentation in [email or web address]/.

## 2018-03-16 NOTE — Assessment & Plan Note (Signed)
Assessment  He continues to have low back pain likely related to his antalgic gait.  Subjectively it is slightly improved from the previous visit although he is unsure if the Voltaren gel is working.  That said, his pain is severe enough on occasion that he will take an oxycodone tablet approximately once per week.  Plan  He was encouraged to continue to take the oxycodone when the pain was severe enough.  We will continue the Voltaren gel for now and reassess its efficacy at the follow-up visit.  He was encouraged to continue to use his hinged brace and remain as active as possible.

## 2018-03-16 NOTE — Progress Notes (Signed)
Indication: History of venous thromboembolism. Duration: Indefinite per patient preference. INR: At target. Agree with Dr. Gladstone Pih assessment and plan.

## 2018-03-16 NOTE — Assessment & Plan Note (Signed)
He received a flu vaccination today.  A referral for his surveillance colonoscopy was also placed today.  He is otherwise up-to-date on his healthcare maintenance.

## 2018-03-16 NOTE — Assessment & Plan Note (Signed)
Assessment  He continues to have bilateral pain, left greater than right.  He is unsure if the Voltaren gel is helping.  He has been much better about wearing the above the knee brace to decrease the extent of hyperinflation he has been experiencing in the left knee with ambulation.  Plan  We will continue the Voltaren gel for now and reassess its efficacy at the follow-up visit.  He was encouraged to continue to use his hinged brace.  We will further discuss the topic of hydrotherapy to be more active when he is seen at follow-up.  In the meantime, he plans on slowly increasing his activity with short runs of ambulation in the neighborhood.

## 2018-04-12 DIAGNOSIS — Z86010 Personal history of colonic polyps: Secondary | ICD-10-CM | POA: Diagnosis not present

## 2018-04-12 DIAGNOSIS — K5901 Slow transit constipation: Secondary | ICD-10-CM | POA: Diagnosis not present

## 2018-04-16 ENCOUNTER — Ambulatory Visit (INDEPENDENT_AMBULATORY_CARE_PROVIDER_SITE_OTHER): Payer: Medicare HMO | Admitting: Pharmacist

## 2018-04-16 ENCOUNTER — Telehealth: Payer: Self-pay | Admitting: Pharmacist

## 2018-04-16 DIAGNOSIS — Z5181 Encounter for therapeutic drug level monitoring: Secondary | ICD-10-CM | POA: Diagnosis not present

## 2018-04-16 DIAGNOSIS — Z7901 Long term (current) use of anticoagulants: Secondary | ICD-10-CM | POA: Diagnosis not present

## 2018-04-16 DIAGNOSIS — Z86718 Personal history of other venous thrombosis and embolism: Secondary | ICD-10-CM | POA: Diagnosis not present

## 2018-04-16 LAB — POCT INR: INR: 2.4 (ref 2.0–3.0)

## 2018-04-16 NOTE — Telephone Encounter (Signed)
Discussed case with Dr. Brayton Mars: Is bridging therapy with LMWH necessary around planed colonoscopy on 27-NOV-19. Dr. Eppie Gibson indicates after review of patient/problem list--that NO bridge therapy is indicated.

## 2018-04-16 NOTE — Progress Notes (Signed)
Anticoagulation Management Alexander English is a 62 y.o. male who reports to the clinic for monitoring of warfarin treatment.    Indication: DVT, history of; long term current use of anticoagulant.  Duration: indefinite Supervising physician: [name], MD  Anticoagulation Clinic Visit History: Patient does not report signs/symptoms of bleeding or thromboembolism  Other recent changes: No diet, medications, lifestyle changes endorsed at this visit.  Anticoagulation Episode Summary    Current INR goal:   2.0-3.0  TTR:   83.4 % (6.1 y)  Next INR check:   05/21/2018  INR from last check:   2.4 (04/16/2018)  Weekly max warfarin dose:     Target end date:   Indefinite  INR check location:   Anticoagulation Clinic  Preferred lab:     Send INR reminders to:   [facility]   Indications   History of venous thromboembolism [Z86.718]       Comments:           No Known Allergies Prior to Admission medications   Medication Sig Start Date End Date Taking? Authorizing Provider  aspirin EC 81 MG tablet Take 81 mg by mouth daily.   Yes [provider]  atorvastatin (LIPITOR) 10 MG tablet Take 1 tablet (10 mg total) daily by mouth. 05/12/17  Yes [name], MD  fesoterodine (TOVIAZ) 8 MG TB24 tablet Take 1 tablet (8 mg total) by mouth daily. 03/16/18  Yes [name], MD  HYDROcodone-acetaminophen (HYCET) 7.5-325 mg/15 ml solution Take 15 mLs by mouth 4 (four) times daily as needed for moderate pain. 11/24/17  Yes [name], MD  Multiple Vitamin (MULTIVITAMIN) tablet Take 1 tablet by mouth daily.   Yes [provider]  promethazine (PHENERGAN) 25 MG suppository Place 1 suppository (25 mg total) rectally every 6 (six) hours as needed for nausea or vomiting. 11/24/17  Yes [name], MD  sorbitol 70 % solution Take 15 mLs by mouth daily as needed. 03/31/17  Yes [name], MD  warfarin (COUMADIN) 5 MG tablet Take one tablet by mouth daily--EXCEPT on  Tuesdays, take only 1/2 tablet. 02/05/18  Yes [name], RPH-CPP  zolpidem (AMBIEN) 5 MG tablet Take 1 tablet (5 mg total) by mouth at bedtime as needed for sleep. 10/16/17  Yes [name], MD   Past Medical History:  Diagnosis Date  . Aortic atherosclerosis ([place]) 10/23/2017   Asymptomatic, seen on CT scan  . Bilateral cataracts 05/18/2016   Not visually significant  . Chronic constipation 06/12/2013  . Chronic low back pain 06/12/2013   L4-5 facet arthropathy   . Chronic pain of both shoulders 12/07/2013   A/C joint osteoarthritis   . Cluster headaches    Resolved in 2006  . COPD (chronic obstructive pulmonary disease) ([place])   . Dry eye syndrome, bilateral 05/18/2016  . Embolic stroke involving right middle cerebral artery ([place]) 08/08/2004   Occured after traveling from [place] to [place] where he was hospitalized for 1 month with no further work-up or therapy.  Returned to [place] unable to walk and was admitted to [facility] immediately after landing. Presumed embolic per neuro but TEE, bubble study, and hypercoag W/U negative. On indefinite coumadin per patient's informed preference.  Residual effects : Left spastic hemiplegia. Tx with phenol tibi  . History of kidney stones   . Hyperplastic colon polyp 07/27/2012   Excised endoscopically 07/27/2012  . Hypertensive retinopathy of both eyes 05/18/2016  . Internal and external hemorrhoids without complication 1/69/6789   Seen on colonoscopy  04/03/2007.  Marland Kitchen Left nephrolithiasis 08/31/2015   With mild left hydronephrosis.  Scheduled to undergo shockwave lithotripsy.  . Obesity (BMI 30.0-34.9) 12/07/2013  . Osteoarthritis of both knees 01/26/2012  . Positive PPD 12/07/2013  . Pulmonary embolism ([place]) 09/30/2004   Diagnosed in April 2006 after returning from [place].  Likely provoked as it occurred after a 12 hour plane flight within 2 months of R MCA CVA with left hemiparesis. Patient has made an informed decision to remain on lifelong warfarin.   . Tubular  adenoma 12/19/2017   Endoscopically excised 04/03/2007.  Marland Kitchen Urge incontinence of urine 08/31/2015   Possibly secondary to prior CVA.  Treated with Myrbetriq.   Social History   Socioeconomic History  . Marital status: Divorced    Spouse name: Not on file  . Number of children: Not on file  . Years of education: Not on file  . Highest education level: Not on file  Occupational History  . Not on file  Social Needs  . Financial resource strain: Not on file  . Food insecurity:    Worry: Not on file    Inability: Not on file  . Transportation needs:    Medical: Not on file    Non-medical: Not on file  Tobacco Use  . Smoking status: Former Smoker    Last attempt to quit: 06/28/2007    Years since quitting: 10.8  . Smokeless tobacco: Never Used  Substance and Sexual Activity  . Alcohol use: No    Alcohol/week: 0.0 standard drinks    Comment: Remote past  . Drug use: No  . Sexual activity: Not on file  Lifestyle  . Physical activity:    Days per week: Not on file    Minutes per session: Not on file  . Stress: Not on file  Relationships  . Social connections:    Talks on phone: Not on file    Gets together: Not on file    Attends religious service: Not on file    Active member of club or organization: Not on file    Attends meetings of clubs or organizations: Not on file    Relationship status: Not on file  Other Topics Concern  . Not on file  Social History Narrative   Born in [place] but has been in the [place] for > 30 years.  Divorced, 1 daughter and 2 sons.  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   Family History  Problem Relation Age of Onset  . Stroke Maternal Grandmother   . Unexplained death Mother   . Depression Mother        After husband's death  . Unexplained death Father   . Osteoarthritis Father        Knees  . Early death Sister   . Seizures Sister   . Early death Brother 69       Unknown cause  . Healthy  Daughter   . Healthy Son   . Stroke Maternal Aunt   . Healthy Sister   . Healthy Sister   . Unexplained death Brother   . Healthy Brother   . Healthy Son     ASSESSMENT Recent Results: The most recent result is correlated with 30 mg per week: Lab Results  Component Value Date   INR 2.4 04/16/2018   INR 2.4 03/16/2018   INR 3.1 (A) 03/05/2018    Anticoagulation Dosing: Description   Take one (1) tablet of your  5mg  peach-colored warfarin tablets by mouth, once-daily at 6PM--EXCEPT on [place]. Take only 1/2 tablet on Monday and Thursday.        INR today: Therapeutic  PLAN Weekly dose was unchanged.   Patient Instructions  Patient instructed to take medications as defined in the Anti-coagulation Track section of this encounter.  Patient instructed to take today's dose.  Patient instructed to take LAST DOSE (before planned procedure on 27-NOV-19) on 22-NOV-19).  Patient instructed to take one (1) tablet of your 5mg  peach-colored warfarin tablets by mouth, once-daily at 6PM--EXCEPT on [place]. Take only 1/2 tablet on Monday and Thursday--LAST DOSE (before planned procedure on 27-NOV-19) on 22-NOV-19.  Patient verbalized understanding of these instructions.     Patient advised to contact clinic or seek medical attention if signs/symptoms of bleeding or thromboembolism occur.  Patient verbalized understanding by repeating back information and was advised to contact me if further medication-related questions arise. Patient was also provided an information handout.  Follow-up Return in 5 weeks (on 05/21/2018) for Follow up INR at  1030h.  [name], PharmD, CPP  15 minutes spent face-to-face with the patient during the encounter. 50% of time spent on education. 50% of time was spent on fingerstick point of care INR sample collection, processing, results determination, and documentation in [email or web address].net/.

## 2018-04-16 NOTE — Patient Instructions (Signed)
Patient instructed to take medications as defined in the Anti-coagulation Track section of this encounter.  Patient instructed to take today's dose.  Patient instructed to take LAST DOSE (before planned procedure on 27-NOV-19) on 22-NOV-19).  Patient instructed to take one (1) tablet of your 5mg  peach-colored warfarin tablets by mouth, once-daily at 6PM--EXCEPT on [place]. Take only 1/2 tablet on Monday and Thursday--LAST DOSE (before planned procedure on 27-NOV-19) on 22-NOV-19.  Patient verbalized understanding of these instructions.

## 2018-04-16 NOTE — Progress Notes (Signed)
INTERNAL MEDICINE TEACHING ATTENDING ADDENDUM  I agree with pharmacy recommendations as outlined in their note.   Alexander N Raines, MD  

## 2018-05-01 ENCOUNTER — Other Ambulatory Visit: Payer: Self-pay

## 2018-05-01 DIAGNOSIS — Z86711 Personal history of pulmonary embolism: Secondary | ICD-10-CM

## 2018-05-01 MED ORDER — WARFARIN SODIUM 5 MG PO TABS: ORAL_TABLET | ORAL | 1 refills | Status: DC

## 2018-05-01 NOTE — Telephone Encounter (Signed)
warfarin (COUMADIN) 5 MG tablet, refill request @  [facility] ([address] [place] [address] [phone] (Phone) [phone] (Fax)

## 2018-05-21 ENCOUNTER — Ambulatory Visit (INDEPENDENT_AMBULATORY_CARE_PROVIDER_SITE_OTHER): Payer: Medicare HMO | Admitting: Pharmacist

## 2018-05-21 DIAGNOSIS — Z5181 Encounter for therapeutic drug level monitoring: Secondary | ICD-10-CM

## 2018-05-21 DIAGNOSIS — Z7901 Long term (current) use of anticoagulants: Secondary | ICD-10-CM

## 2018-05-21 DIAGNOSIS — Z86718 Personal history of other venous thrombosis and embolism: Secondary | ICD-10-CM | POA: Diagnosis not present

## 2018-05-21 LAB — POCT INR: INR: 1.6 — AB (ref 2.0–3.0)

## 2018-05-21 NOTE — Progress Notes (Signed)
Anticoagulation Management Alexander English is a 62 y.o. male who reports to the clinic for monitoring of warfarin treatment.    Indication: DVT, history of; long term current use of anticoagulant. Duration: indefinite Supervising physician: [name] MD  Anticoagulation Clinic Visit History: Patient does not report signs/symptoms of bleeding or thromboembolism  Other recent changes: No diet, medications, lifestyle changes except warfarin currently on hold for planned colonoscopy on 27-NOV-19. Anticoagulation Episode Summary    Current INR goal:   2.0-3.0  TTR:   82.8 % (6.2 y)  Next INR check:   06/11/2018  INR from last check:     Weekly max warfarin dose:     Target end date:   Indefinite  INR check location:   Anticoagulation Clinic  Preferred lab:     Send INR reminders to:   [facility]   Indications   History of venous thromboembolism [Z86.718]       Comments:           No Known Allergies Prior to Admission medications   Medication Sig Start Date End Date Taking? Authorizing Provider  aspirin EC 81 MG tablet Take 81 mg by mouth daily.   Yes [provider]  atorvastatin (LIPITOR) 10 MG tablet Take 1 tablet (10 mg total) daily by mouth. 05/12/17  Yes [name], MD  fesoterodine (TOVIAZ) 8 MG TB24 tablet Take 1 tablet (8 mg total) by mouth daily. 03/16/18  Yes [name], MD  HYDROcodone-acetaminophen (HYCET) 7.5-325 mg/15 ml solution Take 15 mLs by mouth 4 (four) times daily as needed for moderate pain. 11/24/17  Yes [name], MD  Multiple Vitamin (MULTIVITAMIN) tablet Take 1 tablet by mouth daily.   Yes [provider]  promethazine (PHENERGAN) 25 MG suppository Place 1 suppository (25 mg total) rectally every 6 (six) hours as needed for nausea or vomiting. 11/24/17  Yes [name], MD  sorbitol 70 % solution Take 15 mLs by mouth daily as needed. 03/31/17  Yes [name], MD  zolpidem (AMBIEN) 5 MG tablet Take 1 tablet (5  mg total) by mouth at bedtime as needed for sleep. 10/16/17  Yes [name], MD  warfarin (COUMADIN) 5 MG tablet Take one tablet by mouth daily--EXCEPT on Tuesdays, take only 1/2 tablet. Patient not taking: Reported on 05/21/2018 05/01/18   [name], RPH-CPP   Past Medical History:  Diagnosis Date  . Aortic atherosclerosis ([place]) 10/23/2017   Asymptomatic, seen on CT scan  . Bilateral cataracts 05/18/2016   Not visually significant  . Chronic constipation 06/12/2013  . Chronic low back pain 06/12/2013   L4-5 facet arthropathy   . Chronic pain of both shoulders 12/07/2013   A/C joint osteoarthritis   . Cluster headaches    Resolved in 2006  . COPD (chronic obstructive pulmonary disease) ([place])   . Dry eye syndrome, bilateral 05/18/2016  . Embolic stroke involving right middle cerebral artery ([place]) 08/08/2004   Occured after traveling from [place] to [place] where he was hospitalized for 1 month with no further work-up or therapy.  Returned to [place] unable to walk and was admitted to [facility] immediately after landing. Presumed embolic per neuro but TEE, bubble study, and hypercoag W/U negative. On indefinite coumadin per patient's informed preference.  Residual effects : Left spastic hemiplegia. Tx with phenol tibi  . History of kidney stones   . Hyperplastic colon polyp 07/27/2012   Excised endoscopically 07/27/2012  . Hypertensive retinopathy of both eyes 05/18/2016  . Internal and external  hemorrhoids without complication 0/35/5974   Seen on colonoscopy 04/03/2007.  Marland Kitchen Left nephrolithiasis 08/31/2015   With mild left hydronephrosis.  Scheduled to undergo shockwave lithotripsy.  . Obesity (BMI 30.0-34.9) 12/07/2013  . Osteoarthritis of both knees 01/26/2012  . Positive PPD 12/07/2013  . Pulmonary embolism ([place]) 09/30/2004   Diagnosed in April 2006 after returning from [place].  Likely provoked as it occurred after a 12 hour plane flight within 2 months of R MCA CVA with left hemiparesis. Patient has  made an informed decision to remain on lifelong warfarin.   . Tubular adenoma 12/19/2017   Endoscopically excised 04/03/2007.  Marland Kitchen Urge incontinence of urine 08/31/2015   Possibly secondary to prior CVA.  Treated with Myrbetriq.   Social History   Socioeconomic History  . Marital status: Divorced    Spouse name: Not on file  . Number of children: Not on file  . Years of education: Not on file  . Highest education level: Not on file  Occupational History  . Not on file  Social Needs  . Financial resource strain: Not on file  . Food insecurity:    Worry: Not on file    Inability: Not on file  . Transportation needs:    Medical: Not on file    Non-medical: Not on file  Tobacco Use  . Smoking status: Former Smoker    Last attempt to quit: 06/28/2007    Years since quitting: 10.9  . Smokeless tobacco: Never Used  Substance and Sexual Activity  . Alcohol use: No    Alcohol/week: 0.0 standard drinks    Comment: Remote past  . Drug use: No  . Sexual activity: Not on file  Lifestyle  . Physical activity:    Days per week: Not on file    Minutes per session: Not on file  . Stress: Not on file  Relationships  . Social connections:    Talks on phone: Not on file    Gets together: Not on file    Attends religious service: Not on file    Active member of club or organization: Not on file    Attends meetings of clubs or organizations: Not on file    Relationship status: Not on file  Other Topics Concern  . Not on file  Social History Narrative   Born in [place] but has been in the [place] for > 30 years.  Divorced, 1 daughter and 2 sons.  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   Family History  Problem Relation Age of Onset  . Stroke Maternal Grandmother   . Unexplained death Mother   . Depression Mother        After husband's death  . Unexplained death Father   . Osteoarthritis Father        Knees  . Early death Sister   . Seizures  Sister   . Early death Brother 55       Unknown cause  . Healthy Daughter   . Healthy Son   . Stroke Maternal Aunt   . Healthy Sister   . Healthy Sister   . Unexplained death Brother   . Healthy Brother   . Healthy Son     ASSESSMENT Recent Results: The most recent result is correlated with [place] since Friday, May 18, 2018 until today, Hold continues through planned procedure date 27-NOV-19. Lab Results  Component Value Date   INR 1.6 (A) 05/21/2018  INR 2.4 04/16/2018   INR 2.4 03/16/2018    Anticoagulation Dosing: Description   CURRENTLY ON HOLD until planned colonoscopy on Wednesday 27-NOV-19. Day AFTER colonoscopy, take one (1) tablet of your 5mg  peach-colored warfarin tablets by mouth, once-daily at 6PM--EXCEPT on [place]. Take only 1/2 tablet on Monday and Thursday.        INR today: Subtherapeutic  PLAN Weekly dose was deferred at this time. Patient advised to purchase and drink 3-4 bottles of Lipton Green Tea in an effort (plus the decline in INR that will be seen from today to Wednesday 27-NOV-19) to decrease his INR to 1.3 or less. Upon completion of his procedure, he will recommence warfarin based upon last instructions provided prior to hold instructions and will RTC for repeat INR.   Patient Instructions  Patient instructed to take medications as defined in the Anti-coagulation Track section of this encounter.  Patient instructed to OMIT today's dose.  Patient instructed that warfarin is CURRENTLY ON HOLD until planned colonoscopy on Wednesday 27-NOV-19. Day AFTER colonoscopy, take one (1) tablet of your 5mg  peach-colored warfarin tablets by mouth, once-daily at 6PM--EXCEPT on [place]. Take only 1/2 tablet on Monday and Thursday.  Patient verbalized understanding of these instructions.     Patient advised to contact clinic or seek medical attention if signs/symptoms of bleeding or thromboembolism occur.  Patient  verbalized understanding by repeating back information and was advised to contact me if further medication-related questions arise. Patient was also provided an information handout.  Follow-up Return in 3 weeks (on 06/11/2018) for Follow up INR at  1030h.  [name], PharmD, CCP  15 minutes spent face-to-face with the patient during the encounter. 50% of time spent on education. 50% of time was spent on fingerstick point of care INR sample collection, processing, results determination and documentation in http://www.kim.net/.

## 2018-05-21 NOTE — Patient Instructions (Signed)
Patient instructed to take medications as defined in the Anti-coagulation Track section of this encounter.  Patient instructed to OMIT today's dose.  Patient instructed that warfarin is CURRENTLY ON HOLD until planned colonoscopy on Wednesday 27-NOV-19. Day AFTER colonoscopy, take one (1) tablet of your 5mg  peach-colored warfarin tablets by mouth, once-daily at 6PM--EXCEPT on [place]. Take only 1/2 tablet on Monday and Thursday.  Patient verbalized understanding of these instructions.

## 2018-05-21 NOTE — Progress Notes (Signed)
INTERNAL MEDICINE TEACHING ATTENDING ADDENDUM  I agree with pharmacy recommendations as outlined in their note.   Alexander N Raines, MD  

## 2018-05-23 DIAGNOSIS — Z86010 Personal history of colonic polyps: Secondary | ICD-10-CM | POA: Diagnosis not present

## 2018-05-23 DIAGNOSIS — K635 Polyp of colon: Secondary | ICD-10-CM | POA: Diagnosis not present

## 2018-05-29 DIAGNOSIS — K635 Polyp of colon: Secondary | ICD-10-CM | POA: Diagnosis not present

## 2018-06-05 DIAGNOSIS — C310 Malignant neoplasm of maxillary sinus: Secondary | ICD-10-CM | POA: Diagnosis not present

## 2018-06-11 ENCOUNTER — Encounter: Payer: Self-pay | Admitting: Pharmacist

## 2018-06-11 ENCOUNTER — Ambulatory Visit (INDEPENDENT_AMBULATORY_CARE_PROVIDER_SITE_OTHER): Payer: Medicare HMO | Admitting: Pharmacist

## 2018-06-11 DIAGNOSIS — Z5181 Encounter for therapeutic drug level monitoring: Secondary | ICD-10-CM | POA: Diagnosis not present

## 2018-06-11 DIAGNOSIS — Z86718 Personal history of other venous thrombosis and embolism: Secondary | ICD-10-CM | POA: Diagnosis not present

## 2018-06-11 DIAGNOSIS — Z7901 Long term (current) use of anticoagulants: Secondary | ICD-10-CM

## 2018-06-11 LAB — POCT INR: INR: 2.3 (ref 2.0–3.0)

## 2018-06-11 NOTE — Patient Instructions (Signed)
Patient instructed to take medications as defined in the Anti-coagulation Track section of this encounter.  Patient instructed to take today's dose.  Patient instructed to take one (1) tablet of your 5mg  peach-colored warfarin tablets by mouth, once-daily at 6PM--EXCEPT on [place]. Take only 1/2 tablet on Monday and Thursday.  Patient verbalized understanding of these instructions.

## 2018-06-11 NOTE — Progress Notes (Signed)
Anticoagulation Management Alexander English is a 62 y.o. male who reports to the clinic for monitoring of warfarin treatment.    Indication: DVT, History of (Resolved), long term current use of anticoagulant.   Duration: indefinite Supervising physician: Joni Reining  Anticoagulation Clinic Visit History: Patient does not report signs/symptoms of bleeding or thromboembolism  Other recent changes: No diet, medications, lifestyle changes endorsed.  Anticoagulation Episode Summary    Current INR goal:   2.0-3.0  TTR:   82.5 % (6.2 y)  Next INR check:   07/02/2018  INR from last check:     Weekly max warfarin dose:     Target end date:   Indefinite  INR check location:   Anticoagulation Clinic  Preferred lab:     Send INR reminders to:   [facility]   Indications   History of venous thromboembolism [Z86.718]       Comments:           No Known Allergies Prior to Admission medications   Medication Sig Start Date End Date Taking? Authorizing Provider  aspirin EC 81 MG tablet Take 81 mg by mouth daily.   Yes [provider]  atorvastatin (LIPITOR) 10 MG tablet Take 1 tablet (10 mg total) daily by mouth. 05/12/17  Yes [name], MD  fesoterodine (TOVIAZ) 8 MG TB24 tablet Take 1 tablet (8 mg total) by mouth daily. 03/16/18  Yes [name], MD  HYDROcodone-acetaminophen (HYCET) 7.5-325 mg/15 ml solution Take 15 mLs by mouth 4 (four) times daily as needed for moderate pain. 11/24/17  Yes [name], MD  Multiple Vitamin (MULTIVITAMIN) tablet Take 1 tablet by mouth daily.   Yes [provider]  promethazine (PHENERGAN) 25 MG suppository Place 1 suppository (25 mg total) rectally every 6 (six) hours as needed for nausea or vomiting. 11/24/17  Yes [name], MD  sorbitol 70 % solution Take 15 mLs by mouth daily as needed. 03/31/17  Yes [name], MD  warfarin (COUMADIN) 5 MG tablet Take one tablet by mouth daily--EXCEPT on Tuesdays, take only 1/2  tablet. 05/01/18  Yes [name], RPH-CPP  zolpidem (AMBIEN) 5 MG tablet Take 1 tablet (5 mg total) by mouth at bedtime as needed for sleep. 10/16/17  Yes [name], MD   Past Medical History:  Diagnosis Date  . Aortic atherosclerosis ([place]) 10/23/2017   Asymptomatic, seen on CT scan  . Bilateral cataracts 05/18/2016   Not visually significant  . Chronic constipation 06/12/2013  . Chronic low back pain 06/12/2013   L4-5 facet arthropathy   . Chronic pain of both shoulders 12/07/2013   A/C joint osteoarthritis   . Cluster headaches    Resolved in 2006  . COPD (chronic obstructive pulmonary disease) ([place])   . Dry eye syndrome, bilateral 05/18/2016  . Embolic stroke involving right middle cerebral artery ([place]) 08/08/2004   Occured after traveling from [place] to [place] where he was hospitalized for 1 month with no further work-up or therapy.  Returned to [place] unable to walk and was admitted to [facility] immediately after landing. Presumed embolic per neuro but TEE, bubble study, and hypercoag W/U negative. On indefinite coumadin per patient's informed preference.  Residual effects : Left spastic hemiplegia. Tx with phenol tibi  . History of kidney stones   . Hyperplastic colon polyp 07/27/2012   Excised endoscopically 07/27/2012  . Hypertensive retinopathy of both eyes 05/18/2016  . Internal and external hemorrhoids without complication 3/32/9518   Seen on colonoscopy 04/03/2007.  Marland Kitchen  Left nephrolithiasis 08/31/2015   With mild left hydronephrosis.  Scheduled to undergo shockwave lithotripsy.  . Obesity (BMI 30.0-34.9) 12/07/2013  . Osteoarthritis of both knees 01/26/2012  . Positive PPD 12/07/2013  . Pulmonary embolism ([place]) 09/30/2004   Diagnosed in April 2006 after returning from [place].  Likely provoked as it occurred after a 12 hour plane flight within 2 months of R MCA CVA with left hemiparesis. Patient has made an informed decision to remain on lifelong warfarin.   . Tubular adenoma 12/19/2017    Endoscopically excised 04/03/2007.  Marland Kitchen Urge incontinence of urine 08/31/2015   Possibly secondary to prior CVA.  Treated with Myrbetriq.   Social History   Socioeconomic History  . Marital status: Divorced    Spouse name: Not on file  . Number of children: Not on file  . Years of education: Not on file  . Highest education level: Not on file  Occupational History  . Not on file  Social Needs  . Financial resource strain: Not on file  . Food insecurity:    Worry: Not on file    Inability: Not on file  . Transportation needs:    Medical: Not on file    Non-medical: Not on file  Tobacco Use  . Smoking status: Former Smoker    Last attempt to quit: 06/28/2007    Years since quitting: 10.9  . Smokeless tobacco: Never Used  Substance and Sexual Activity  . Alcohol use: No    Alcohol/week: 0.0 standard drinks    Comment: Remote past  . Drug use: No  . Sexual activity: Not on file  Lifestyle  . Physical activity:    Days per week: Not on file    Minutes per session: Not on file  . Stress: Not on file  Relationships  . Social connections:    Talks on phone: Not on file    Gets together: Not on file    Attends religious service: Not on file    Active member of club or organization: Not on file    Attends meetings of clubs or organizations: Not on file    Relationship status: Not on file  Other Topics Concern  . Not on file  Social History Narrative   Born in [place] but has been in the [place] for > 30 years.  Divorced, 1 daughter and 2 sons.  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   Family History  Problem Relation Age of Onset  . Stroke Maternal Grandmother   . Unexplained death Mother   . Depression Mother        After husband's death  . Unexplained death Father   . Osteoarthritis Father        Knees  . Early death Sister   . Seizures Sister   . Early death Brother 62       Unknown cause  . Healthy Daughter   . Healthy Son    . Stroke Maternal Aunt   . Healthy Sister   . Healthy Sister   . Unexplained death Brother   . Healthy Brother   . Healthy Son     ASSESSMENT Recent Results: The most recent result is correlated with 30 mg per week: Lab Results  Component Value Date   INR 2.3 06/11/2018   INR 1.6 (A) 05/21/2018   INR 2.4 04/16/2018    Anticoagulation Dosing: Description   Take one (1) tablet of your 5mg  peach-colored warfarin  tablets by mouth, once-daily at 6PM--EXCEPT on [place]. Take only 1/2 tablet on Monday and Thursday.        INR today: Therapeutic  PLAN Weekly dose was unchanged.    Patient Instructions  Patient instructed to take medications as defined in the Anti-coagulation Track section of this encounter.  Patient instructed to take today's dose.  Patient instructed to take one (1) tablet of your 5mg  peach-colored warfarin tablets by mouth, once-daily at 6PM--EXCEPT on [place]. Take only 1/2 tablet on Monday and Thursday.  Patient verbalized understanding of these instructions.     Patient advised to contact clinic or seek medical attention if signs/symptoms of bleeding or thromboembolism occur.  Patient verbalized understanding by repeating back information and was advised to contact me if further medication-related questions arise. Patient was also provided an information handout.  Follow-up Return in 3 weeks (on 07/02/2018) for Follow up INR at 0945h.  [name], PharmD, CPP  15 minutes spent face-to-face with the patient during the encounter. 50% of time spent on education. 50% of time was spent on fingerstick point of care INR sample collection, processing, results determination, dose adjustment and documentation in http://www.kim.net/.

## 2018-06-28 ENCOUNTER — Other Ambulatory Visit: Payer: Self-pay | Admitting: Internal Medicine

## 2018-06-28 DIAGNOSIS — K5909 Other constipation: Secondary | ICD-10-CM

## 2018-06-28 NOTE — Telephone Encounter (Signed)
Next appt scheduled 08/03/18 with PCP. 

## 2018-06-29 ENCOUNTER — Encounter: Payer: Medicare HMO | Admitting: Internal Medicine

## 2018-07-02 ENCOUNTER — Ambulatory Visit (INDEPENDENT_AMBULATORY_CARE_PROVIDER_SITE_OTHER): Payer: Medicare HMO | Admitting: Pharmacist

## 2018-07-02 ENCOUNTER — Encounter: Payer: Self-pay | Admitting: Internal Medicine

## 2018-07-02 ENCOUNTER — Other Ambulatory Visit: Payer: Self-pay

## 2018-07-02 ENCOUNTER — Encounter: Payer: Self-pay | Admitting: Pharmacist

## 2018-07-02 ENCOUNTER — Ambulatory Visit (INDEPENDENT_AMBULATORY_CARE_PROVIDER_SITE_OTHER): Payer: Medicare HMO | Admitting: Internal Medicine

## 2018-07-02 VITALS — BP 135/93 | HR 71 | Temp 97.4°F | Ht 72.0 in | Wt 225.8 lb

## 2018-07-02 DIAGNOSIS — E785 Hyperlipidemia, unspecified: Secondary | ICD-10-CM | POA: Diagnosis not present

## 2018-07-02 DIAGNOSIS — Z86711 Personal history of pulmonary embolism: Secondary | ICD-10-CM

## 2018-07-02 DIAGNOSIS — Z86718 Personal history of other venous thrombosis and embolism: Secondary | ICD-10-CM

## 2018-07-02 DIAGNOSIS — M170 Bilateral primary osteoarthritis of knee: Secondary | ICD-10-CM | POA: Diagnosis not present

## 2018-07-02 DIAGNOSIS — Z79891 Long term (current) use of opiate analgesic: Secondary | ICD-10-CM

## 2018-07-02 DIAGNOSIS — Z87442 Personal history of urinary calculi: Secondary | ICD-10-CM

## 2018-07-02 DIAGNOSIS — M5442 Lumbago with sciatica, left side: Secondary | ICD-10-CM

## 2018-07-02 DIAGNOSIS — Z7901 Long term (current) use of anticoagulants: Secondary | ICD-10-CM

## 2018-07-02 DIAGNOSIS — Z5181 Encounter for therapeutic drug level monitoring: Secondary | ICD-10-CM

## 2018-07-02 DIAGNOSIS — G8929 Other chronic pain: Secondary | ICD-10-CM | POA: Diagnosis not present

## 2018-07-02 DIAGNOSIS — N200 Calculus of kidney: Secondary | ICD-10-CM | POA: Diagnosis not present

## 2018-07-02 DIAGNOSIS — I69354 Hemiplegia and hemiparesis following cerebral infarction affecting left non-dominant side: Secondary | ICD-10-CM | POA: Diagnosis not present

## 2018-07-02 DIAGNOSIS — R260 Ataxic gait: Secondary | ICD-10-CM | POA: Diagnosis not present

## 2018-07-02 DIAGNOSIS — Z86010 Personal history of colonic polyps: Secondary | ICD-10-CM

## 2018-07-02 DIAGNOSIS — R109 Unspecified abdominal pain: Secondary | ICD-10-CM

## 2018-07-02 LAB — POCT INR: INR: 2.6 (ref 2.0–3.0)

## 2018-07-02 NOTE — Patient Instructions (Signed)
Patient instructed to take medications as defined in the Anti-coagulation Track section of this encounter.  Patient instructed to take today's dose. Patient instructed to take  one (1) tablet of your 5mg  peach-colored warfarin tablets by mouth, once-daily at 6PM--EXCEPT on [place]. Take only 1/2 tablet on Monday and Thursday. Patient verbalized understanding of these instructions.

## 2018-07-02 NOTE — Patient Instructions (Addendum)
Alexander English  Thank you for coming in to the clinic. Here are our recommendations:  Please continue to take your pain medications as prescribed We will call you and let you know the result of your urine test Please try to follow up with your physical therapist for evaluation  Chronic Back Pain When back pain lasts longer than 3 months, it is called chronic back pain. Pain may get worse at certain times (flare-ups). There are things you can do at home to manage your pain. Follow these instructions at home: Activity      Avoid bending and other activities that make pain worse.  When standing: ? Keep your upper back and neck straight. ? Keep your shoulders pulled back. ? Avoid slouching.  When sitting: ? Keep your back straight. ? Relax your shoulders. Do not round your shoulders or pull them backward.  Do not sit or stand in one place for long periods of time.  Take short rest breaks during the day. Lying down or standing is usually better than sitting. Resting can help relieve pain.  When sitting or lying down for a long time, do some mild activity or stretching. This will help to prevent stiffness and pain.  Get regular exercise. Ask your doctor what activities are safe for you.  Do not lift anything that is heavier than 10 lb (4.5 kg). To prevent injury when you lift things: ? Bend your knees. ? Keep the weight close to your body. ? Avoid twisting. Managing pain  If told, put ice on the painful area. Your doctor may tell you to use ice for 24-48 hours after a flare-up starts. ? Put ice in a plastic bag. ? Place a towel between your skin and the bag. ? Leave the ice on for 20 minutes, 2-3 times a day.  If told, put heat on the painful area as often as told by your doctor. Use the heat source that your doctor recommends, such as a moist heat pack or a heating pad. ? Place a towel between your skin and the heat source. ? Leave the heat on for 20-30 minutes. ? Remove  the heat if your skin turns bright red. This is especially important if you are unable to feel pain, heat, or cold. You may have a greater risk of getting burned.  Soak in a warm bath. This can help relieve pain.  Take over-the-counter and prescription medicines only as told by your doctor. General instructions  Sleep on a firm mattress. Try lying on your side with your knees slightly bent. If you lie on your back, put a pillow under your knees.  Keep all follow-up visits as told by your doctor. This is important. Contact a doctor if:  You have pain that does not get better with rest or medicine. Get help right away if:  One or both of your arms or legs feel weak.  One or both of your arms or legs lose feeling (numbness).  You have trouble controlling when you poop (bowel movement) or pee (urinate).  You feel sick to your stomach (nauseous).  You throw up (vomit).  You have belly (abdominal) pain.  You have shortness of breath.  You pass out (faint). Summary  When back pain lasts longer than 3 months, it is called chronic back pain.  Pain may get worse at certain times (flare-ups).  Use ice and heat as told by your doctor. Your doctor may tell you to use ice after flare-ups. This information is  not intended to replace advice given to you by your health care provider. Make sure you discuss any questions you have with your health care provider. Document Released: 11/30/2007 Document Revised: 01/26/2017 Document Reviewed: 01/26/2017 Elsevier Interactive Patient Education  2019 [employer].

## 2018-07-02 NOTE — Progress Notes (Signed)
CC: Hip pain  HPI: [name] is a 63 y.o. M w/ PMH of osteoarthritis, CVA w/ left sided residual weakness, hx of PE and DVT on Warfarin, tubular adenoma and HLD presenting with Left sided hip pain. He was in his usual state of health until 7 days ago when he began to have acute left sided flank pain with radiation to his groin and to his right hip. He describes the pain as 10/10 sharp, throbbing pain that feels different from his chronic osteoarthritis lower back pain. He mentions that pain is worse with ambulation.  Alleviated by rest.  Pain has been refractory to hydrocodone-acetaminophen that he takes for chronic osteoarthritic pain.  He mentions that he was scheduled for regular physical therapy sessions but has been unable to attend due to high co-pay ($40). He denies any F/N/V/D. Denies any urinary or bowel incontinence, saddle anesthesia, tingling, focal weakness, dysuria, frequency, urgency.  Past Medical History:  Diagnosis Date  . Aortic atherosclerosis ([place]) 10/23/2017   Asymptomatic, seen on CT scan  . Bilateral cataracts 05/18/2016   Not visually significant  . Chronic constipation 06/12/2013  . Chronic low back pain 06/12/2013   L4-5 facet arthropathy   . Chronic pain of both shoulders 12/07/2013   A/C joint osteoarthritis   . Cluster headaches    Resolved in 2006  . COPD (chronic obstructive pulmonary disease) ([place])   . Dry eye syndrome, bilateral 05/18/2016  . Embolic stroke involving right middle cerebral artery ([place]) 08/08/2004   Occured after traveling from [place] to [place] where he was hospitalized for 1 month with no further work-up or therapy.  Returned to [place] unable to walk and was admitted to [facility] immediately after landing. Presumed embolic per neuro but TEE, bubble study, and hypercoag W/U negative. On indefinite coumadin per patient's informed preference.  Residual effects : Left spastic hemiplegia. Tx with phenol tibi  . History of kidney stones   .  Hyperplastic colon polyp 07/27/2012   Excised endoscopically 07/27/2012  . Hypertensive retinopathy of both eyes 05/18/2016  . Internal and external hemorrhoids without complication 1/61/0960   Seen on colonoscopy 04/03/2007.  Marland Kitchen Left nephrolithiasis 08/31/2015   With mild left hydronephrosis.  Scheduled to undergo shockwave lithotripsy.  . Obesity (BMI 30.0-34.9) 12/07/2013  . Osteoarthritis of both knees 01/26/2012  . Positive PPD 12/07/2013  . Pulmonary embolism ([place]) 09/30/2004   Diagnosed in April 2006 after returning from [place].  Likely provoked as it occurred after a 12 hour plane flight within 2 months of R MCA CVA with left hemiparesis. Patient has made an informed decision to remain on lifelong warfarin.   . Tubular adenoma 12/19/2017   Endoscopically excised 04/03/2007.  Marland Kitchen Urge incontinence of urine 08/31/2015   Possibly secondary to prior CVA.  Treated with Myrbetriq.    Review of Systems: Review of Systems  Constitutional: Negative for chills, fever and malaise/fatigue.  Respiratory: Negative for cough, sputum production and shortness of breath.   Cardiovascular: Negative for chest pain and palpitations.  Gastrointestinal: Negative for constipation, diarrhea, nausea and vomiting.  Musculoskeletal: Positive for back pain and joint pain. Negative for falls.  Neurological: Negative for dizziness, tingling, sensory change and focal weakness.     Physical Exam: Vitals:   07/02/18 1031  BP: (!) 135/93  Pulse: 71  Temp: (!) 97.4 F (36.3 C)  TempSrc: Oral  SpO2: 100%  Weight: 225 lb 12.8 oz (102.4 kg)  Height: 6' (1.829 m)    Physical Exam  Constitutional:  He is oriented to person, place, and time. He appears well-developed and well-nourished. No distress.  HENT:  Mouth/Throat: No oropharyngeal exudate.  Neck: Normal range of motion. Neck supple. No JVD present.  Cardiovascular: Normal rate, regular rhythm, normal heart sounds and intact distal pulses.  No murmur  heard. Respiratory: Effort normal and breath sounds normal. He has no rales.  GI: Soft. Bowel sounds are normal. There is no abdominal tenderness (negative L CVA tenderness). There is no rebound and no guarding.  Musculoskeletal:        General: Deformity (Atrophic left lower extremity with brace in place. Smaller in size than right.) present. No tenderness (No paraspinal tenderness to palpation. No tenderness to palpation of L hip).     Comments: Positive straight leg test bilaterally but more severe with L than R. Hyperextended left leg on gait. Passive range of motion intact.   Neurological: He is alert and oriented to person, place, and time. He exhibits abnormal muscle tone. Coordination abnormal.  Neurologic exam: Mental status: A&Ox3 Cranial Nerves: II: PERRL III, IV, VI: Extra-occular motions intact bilaterally V, VII: Face symmetric, sensation intact in all 3 divisions  VIII: hearing normal to rubbing fingers bilaterally  IX, X: palate rises symmetrically XI: Head turn and shoulder shrug normal bilaterally  XII: tongue midline  Motor:  Strength 5/5 RUE, 2/5 LUE, 5/5 RLE, 2/5 LLE Deep Tendon Reflexes: 2+ symmetric Gait:Antalgic gait favoring right side Sensory: Light touch intact and symmetric bilaterally  Coordination: There is no dysmetria on finger-to-nose.  Psychiatric: Normal mood and affect    Assessment & Plan:   Acute left-sided low back pain with left-sided sciatica Presents with acute on chronic left sided lower back/flank pain of 7 day duration. Worsening with effort. Relieved by rest. Has history of chronic lower back pain and gait disturbances due to left sided weakness after CVA. Also has history of left sided nephrolithiasis. Currently not regularly attending physical therapy. Pain describes as sharp, throbbing pain starting above left hip and radiating down to groin and  crossing over toward right hip. No cauda equina symptoms (saddle anesthesia, incontinence, numbness, tingling).  - Most likely musculoskeletal strain from hx of chronic gait disturbance - Currently on hydrocodone-acetaminophen solution QID PRN for chronic pain - UA to r/o nephrolithiasis - Not good candidate for muscle relaxant or gabapentin due to high fall risk - Not good candidate for NSAID therapy in setting of anticoagulation - Recommend close f/u with physical therapy - patient states he would like to reach out to local [employer] for self-directed exercise at the pool: states would like to avoid high co-pay when working with licensed physical therapists.  - Cautioned against un-supervised hydrotherapy and recommended staying in shallow area.    Patient discussed with Dr. Daryll Drown   -Gilberto Better, PGY1

## 2018-07-02 NOTE — Assessment & Plan Note (Signed)
Presents with acute on chronic left sided lower back/flank pain of 7 day duration. Worsening with effort. Relieved by rest. Has history of chronic lower back pain and gait disturbances due to left sided weakness after CVA. Also has history of left sided nephrolithiasis. Currently not regularly attending physical therapy. Pain describes as sharp, throbbing pain starting above left hip and radiating down to groin and crossing over toward right hip. No cauda equina symptoms (saddle anesthesia, incontinence, numbness, tingling).  - Most likely musculoskeletal strain from hx of chronic gait disturbance - Currently on hydrocodone-acetaminophen solution QID PRN for chronic pain - UA to r/o nephrolithiasis - Not good candidate for muscle relaxant or gabapentin due to high fall risk - Not good candidate for NSAID therapy in setting of anticoagulation - Recommend close f/u with physical therapy - patient states he would like to reach out to local [employer] for self-directed exercise at the pool: states would like to avoid high co-pay when working with licensed physical therapists.  - Cautioned against un-supervised hydrotherapy and recommended staying in shallow area.

## 2018-07-02 NOTE — Progress Notes (Signed)
Anticoagulation Management Alexander English is a 63 y.o. male who reports to the clinic for monitoring of warfarin treatment.    Indication: DVT, History of; Long term (current) use of anticoagulant.  Duration: indefinite Supervising physician: Aldine Contes  Anticoagulation Clinic Visit History: Patient does not report signs/symptoms of bleeding or thromboembolism  Other recent changes: No diet, medications, lifestyle changes endorsed.  Anticoagulation Episode Summary    Current INR goal:   2.0-3.0  TTR:   82.6 % (6.3 y)  Next INR check:   08/03/2018  INR from last check:   2.6 (07/02/2018)  Weekly max warfarin dose:     Target end date:   Indefinite  INR check location:   Anticoagulation Clinic  Preferred lab:     Send INR reminders to:   [facility]   Indications   History of venous thromboembolism [Z86.718]       Comments:           No Known Allergies Prior to Admission medications   Medication Sig Start Date End Date Taking? Authorizing Provider  aspirin EC 81 MG tablet Take 81 mg by mouth daily.   Yes [provider]  atorvastatin (LIPITOR) 10 MG tablet Take 1 tablet (10 mg total) daily by mouth. 05/12/17  Yes [name], MD  fesoterodine (TOVIAZ) 8 MG TB24 tablet Take 1 tablet (8 mg total) by mouth daily. 03/16/18  Yes [name], MD  HYDROcodone-acetaminophen (HYCET) 7.5-325 mg/15 ml solution Take 15 mLs by mouth 4 (four) times daily as needed for moderate pain. 11/24/17  Yes [name], MD  Multiple Vitamin (MULTIVITAMIN) tablet Take 1 tablet by mouth daily.   Yes [provider]  promethazine (PHENERGAN) 25 MG suppository Place 1 suppository (25 mg total) rectally every 6 (six) hours as needed for nausea or vomiting. 11/24/17  Yes [name], MD  sorbitol 70 % SOLN Take 15-30 mLs by mouth daily as needed for moderate constipation. 06/29/18  Yes [name], MD  warfarin (COUMADIN) 5 MG tablet Take one tablet by mouth daily--EXCEPT on  Tuesdays, take only 1/2 tablet. 05/01/18  Yes [name], RPH-CPP  zolpidem (AMBIEN) 5 MG tablet Take 1 tablet (5 mg total) by mouth at bedtime as needed for sleep. 10/16/17  Yes [name], MD   Past Medical History:  Diagnosis Date  . Aortic atherosclerosis ([place]) 10/23/2017   Asymptomatic, seen on CT scan  . Bilateral cataracts 05/18/2016   Not visually significant  . Chronic constipation 06/12/2013  . Chronic low back pain 06/12/2013   L4-5 facet arthropathy   . Chronic pain of both shoulders 12/07/2013   A/C joint osteoarthritis   . Cluster headaches    Resolved in 2006  . COPD (chronic obstructive pulmonary disease) ([place])   . Dry eye syndrome, bilateral 05/18/2016  . Embolic stroke involving right middle cerebral artery ([place]) 08/08/2004   Occured after traveling from [place] to [place] where he was hospitalized for 1 month with no further work-up or therapy.  Returned to [place] unable to walk and was admitted to [facility] immediately after landing. Presumed embolic per neuro but TEE, bubble study, and hypercoag W/U negative. On indefinite coumadin per patient's informed preference.  Residual effects : Left spastic hemiplegia. Tx with phenol tibi  . History of kidney stones   . Hyperplastic colon polyp 07/27/2012   Excised endoscopically 07/27/2012  . Hypertensive retinopathy of both eyes 05/18/2016  . Internal and external hemorrhoids without complication 9/48/0165   Seen on colonoscopy 04/03/2007.  Marland Kitchen  Left nephrolithiasis 08/31/2015   With mild left hydronephrosis.  Scheduled to undergo shockwave lithotripsy.  . Obesity (BMI 30.0-34.9) 12/07/2013  . Osteoarthritis of both knees 01/26/2012  . Positive PPD 12/07/2013  . Pulmonary embolism ([place]) 09/30/2004   Diagnosed in April 2006 after returning from [place].  Likely provoked as it occurred after a 12 hour plane flight within 2 months of R MCA CVA with left hemiparesis. Patient has made an informed decision to remain on lifelong warfarin.   . Tubular  adenoma 12/19/2017   Endoscopically excised 04/03/2007.  Marland Kitchen Urge incontinence of urine 08/31/2015   Possibly secondary to prior CVA.  Treated with Myrbetriq.   Social History   Socioeconomic History  . Marital status: Divorced    Spouse name: Not on file  . Number of children: Not on file  . Years of education: Not on file  . Highest education level: Not on file  Occupational History  . Not on file  Social Needs  . Financial resource strain: Not on file  . Food insecurity:    Worry: Not on file    Inability: Not on file  . Transportation needs:    Medical: Not on file    Non-medical: Not on file  Tobacco Use  . Smoking status: Former Smoker    Last attempt to quit: 06/28/2007    Years since quitting: 11.0  . Smokeless tobacco: Never Used  Substance and Sexual Activity  . Alcohol use: No    Alcohol/week: 0.0 standard drinks    Comment: Remote past  . Drug use: No  . Sexual activity: Not on file  Lifestyle  . Physical activity:    Days per week: Not on file    Minutes per session: Not on file  . Stress: Not on file  Relationships  . Social connections:    Talks on phone: Not on file    Gets together: Not on file    Attends religious service: Not on file    Active member of club or organization: Not on file    Attends meetings of clubs or organizations: Not on file    Relationship status: Not on file  Other Topics Concern  . Not on file  Social History Narrative   Born in [place] but has been in the [place] for > 30 years.  Divorced, 1 daughter and 2 sons.  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   Family History  Problem Relation Age of Onset  . Stroke Maternal Grandmother   . Unexplained death Mother   . Depression Mother        After husband's death  . Unexplained death Father   . Osteoarthritis Father        Knees  . Early death Sister   . Seizures Sister   . Early death Brother 80       Unknown cause  . Healthy  Daughter   . Healthy Son   . Stroke Maternal Aunt   . Healthy Sister   . Healthy Sister   . Unexplained death Brother   . Healthy Brother   . Healthy Son     ASSESSMENT Recent Results: The most recent result is correlated with 30 mg per week: Lab Results  Component Value Date   INR 2.6 07/02/2018   INR 2.3 06/11/2018   INR 1.6 (A) 05/21/2018    Anticoagulation Dosing: Description   Take one (1) tablet of your 5mg  peach-colored warfarin  tablets by mouth, once-daily at 6PM--EXCEPT on [place]. Take only 1/2 tablet on Monday and Thursday.        INR today: Therapeutic  PLAN Weekly dose was unchanged.  Patient Instructions  Patient instructed to take medications as defined in the Anti-coagulation Track section of this encounter.  Patient instructed to take today's dose. Patient instructed to take  one (1) tablet of your 5mg  peach-colored warfarin tablets by mouth, once-daily at 6PM--EXCEPT on [place]. Take only 1/2 tablet on Monday and Thursday. Patient verbalized understanding of these instructions.     Patient advised to contact clinic or seek medical attention if signs/symptoms of bleeding or thromboembolism occur.  Patient verbalized understanding by repeating back information and was advised to contact me if further medication-related questions arise. Patient was also provided an information handout.  Follow-up Return in about 1 month (around 08/03/2018) for Follow up INR while being seen by Dr. Eppie Gibson.  [name], PharmD, CPP  15 minutes spent face-to-face with the patient during the encounter. 50% of time spent on education. 50% of time was spent on fingerstick point of care INR sample collection, processing, results determination, and documentation in http://www.kim.net/.

## 2018-07-03 ENCOUNTER — Telehealth: Payer: Self-pay | Admitting: *Deleted

## 2018-07-03 LAB — URINALYSIS, ROUTINE W REFLEX MICROSCOPIC
Bilirubin, UA: NEGATIVE
Glucose, UA: NEGATIVE
Ketones, UA: NEGATIVE
Leukocytes, UA: NEGATIVE
Nitrite, UA: NEGATIVE
Protein, UA: NEGATIVE
RBC, UA: NEGATIVE
Specific Gravity, UA: 1.018 (ref 1.005–1.030)
Urobilinogen, Ur: 0.2 mg/dL (ref 0.2–1.0)
pH, UA: 6 (ref 5.0–7.5)

## 2018-07-03 NOTE — Telephone Encounter (Signed)
Message from pharmacy: soribitol is on back order, no release date, is there an alternative? Please advise

## 2018-07-03 NOTE — Progress Notes (Signed)
INTERNAL MEDICINE TEACHING ATTENDING ADDENDUM - Quinn Bartling M.D  Duration- indefinite, Indication- PE, INR- therapeutic. Agree with pharmacy recommendations as outlined in their note.     

## 2018-07-04 NOTE — Progress Notes (Signed)
Internal Medicine Clinic Attending ° °Case discussed with Dr. Lee at the time of the visit.  We reviewed the resident’s history and exam and pertinent patient test results.  I agree with the assessment, diagnosis, and plan of care documented in the resident’s note.  °

## 2018-07-09 NOTE — Telephone Encounter (Signed)
He can start with an over the counter stool softener as needed.  If that does not work we can add a prescription medication at a later date.  Thank You.

## 2018-07-10 ENCOUNTER — Telehealth: Payer: Self-pay | Admitting: Internal Medicine

## 2018-07-10 NOTE — Telephone Encounter (Signed)
Called pharmacy, spoke w/ pharmacist, ask for the pharmacist to work with pt and assist in finding an OTC stool softner, they are agreeable

## 2018-07-10 NOTE — Telephone Encounter (Signed)
Spoke with Alexander English about his negative UA results. Mentioned most likely his flank pain is musculoskeletal strain from gain instability. He states currently the pain is manageable. Also discussed using OTC stool softener for his constipation. He mentions he will try shopping on [employer]. All other questions and concerns addressed.

## 2018-08-03 ENCOUNTER — Encounter: Payer: Self-pay | Admitting: Pharmacist

## 2018-08-03 ENCOUNTER — Encounter: Payer: Self-pay | Admitting: Internal Medicine

## 2018-08-03 ENCOUNTER — Telehealth: Payer: Self-pay | Admitting: *Deleted

## 2018-08-03 ENCOUNTER — Ambulatory Visit (INDEPENDENT_AMBULATORY_CARE_PROVIDER_SITE_OTHER): Payer: Medicare HMO | Admitting: Internal Medicine

## 2018-08-03 ENCOUNTER — Ambulatory Visit: Payer: Medicare HMO | Admitting: Pharmacist

## 2018-08-03 ENCOUNTER — Other Ambulatory Visit: Payer: Self-pay

## 2018-08-03 VITALS — BP 149/83 | HR 75 | Wt 225.3 lb

## 2018-08-03 DIAGNOSIS — G8929 Other chronic pain: Secondary | ICD-10-CM | POA: Diagnosis not present

## 2018-08-03 DIAGNOSIS — F5105 Insomnia due to other mental disorder: Secondary | ICD-10-CM

## 2018-08-03 DIAGNOSIS — Z86718 Personal history of other venous thrombosis and embolism: Secondary | ICD-10-CM

## 2018-08-03 DIAGNOSIS — K5909 Other constipation: Secondary | ICD-10-CM

## 2018-08-03 DIAGNOSIS — M545 Low back pain, unspecified: Secondary | ICD-10-CM

## 2018-08-03 DIAGNOSIS — Z86711 Personal history of pulmonary embolism: Secondary | ICD-10-CM

## 2018-08-03 DIAGNOSIS — Z23 Encounter for immunization: Secondary | ICD-10-CM

## 2018-08-03 DIAGNOSIS — I6930 Unspecified sequelae of cerebral infarction: Secondary | ICD-10-CM

## 2018-08-03 DIAGNOSIS — R030 Elevated blood-pressure reading, without diagnosis of hypertension: Secondary | ICD-10-CM | POA: Insufficient documentation

## 2018-08-03 DIAGNOSIS — F409 Phobic anxiety disorder, unspecified: Secondary | ICD-10-CM | POA: Diagnosis not present

## 2018-08-03 DIAGNOSIS — M172 Bilateral post-traumatic osteoarthritis of knee: Secondary | ICD-10-CM

## 2018-08-03 DIAGNOSIS — F411 Generalized anxiety disorder: Secondary | ICD-10-CM | POA: Insufficient documentation

## 2018-08-03 DIAGNOSIS — E7800 Pure hypercholesterolemia, unspecified: Secondary | ICD-10-CM

## 2018-08-03 HISTORY — DX: Generalized anxiety disorder: F41.1

## 2018-08-03 LAB — POCT INR: INR: 1.8 — AB (ref 2.0–3.0)

## 2018-08-03 MED ORDER — SERTRALINE HCL 50 MG PO TABS: 50.0000 mg | ORAL_TABLET | Freq: Every day | ORAL | 3 refills | Status: DC

## 2018-08-03 MED ORDER — BENZOYL PEROXIDE 5 % EX LIQD: Freq: Two times a day (BID) | CUTANEOUS | 11 refills | Status: DC

## 2018-08-03 MED ORDER — OXYCODONE-ACETAMINOPHEN 5-325 MG PO TABS: 1.0000 | ORAL_TABLET | Freq: Two times a day (BID) | ORAL | 0 refills | Status: DC | PRN

## 2018-08-03 MED ORDER — ZOLPIDEM TARTRATE 5 MG PO TABS: 5.0000 mg | ORAL_TABLET | Freq: Every evening | ORAL | 5 refills | Status: DC | PRN

## 2018-08-03 MED ORDER — WARFARIN SODIUM 5 MG PO TABS: 5.0000 mg | ORAL_TABLET | Freq: Every day | ORAL | 1 refills | Status: DC

## 2018-08-03 NOTE — Progress Notes (Signed)
Indication: Presumed embolic CVA and history of venous thrombosis. Duration: Indefinite per patient preference. INR: Below target. Agree with Dr. Gladstone Pih assessment and plan.

## 2018-08-03 NOTE — Progress Notes (Signed)
Patient ID: Alexander English, male   DOB: [birth year], 63 y.o.   MRN: [id number]  He was provided #60 percocet 5-325 mg.  Upon his return from [place] I will ask that he sign a formal pain contract.  We will also obtain a random UDS at that time.

## 2018-08-03 NOTE — Patient Instructions (Signed)
As usual it was great to see you again.  I am happy for your return to [place] to visit friends.  1) Keep taking the medications as you are.  It is OK to take the Mg Citrate and sorbitol if you are still constipated with hard stools.  2) We gave you the pneumovax 13 shot.  In 1 year we will give you the pneumovax 23 shot to complete the series.  3) We started sertraline 50 mg daily to see if that helps with the anxiety.  Do not give up on it to fast as it can take 3 weeks to kick in.  We can adjust the dose higher if needed.  4) Please call me with the dates on the oxycodone (percocet).  If needed I will renew the prescription.  It is OK to take these as needed.  I am running out of options for your pain and if this makes your pain tolerable then we will have to use this medication carefully.  I will see you back in June when you get back from [place].

## 2018-08-03 NOTE — Assessment & Plan Note (Signed)
Assessment  He is tolerating the atorvastatin 10 mg by mouth daily without myalgias.  Plan  We will continue with his moderate intensity statin and reassess for intolerances at the follow-up visit.

## 2018-08-03 NOTE — Progress Notes (Signed)
   Subjective:    Patient ID: Alexander English, male    DOB: August 22, 1955, 63 y.o.   MRN: [id number]  HPI  Alexander English is here for follow-up of his history of stroke with residual deficit, osteoarthritis of the knee, and chronic low back pain. Please see the A&P for the status of the pt's chronic medical problems.  His only acute complaint today is intermittent general anxiety that began after his cancer diagnosis.  Although his otolaryngologist feels all of the cancer has been excised he occasionally will think back to this and become anxious.  He is without other acute complaints.  Review of Systems  Constitutional: Negative for activity change and unexpected weight change.  Cardiovascular: Negative for leg swelling.  Gastrointestinal: Positive for constipation. Negative for abdominal pain, diarrhea, nausea and vomiting.  Musculoskeletal: Positive for arthralgias, back pain and gait problem. Negative for joint swelling and myalgias.  Skin: Negative for rash.  Neurological: Positive for weakness.  Psychiatric/Behavioral: The patient is nervous/anxious.       Objective:   Physical Exam Vitals signs and nursing note reviewed.  Constitutional:      General: He is not in acute distress.    Appearance: Normal appearance. He is not ill-appearing, toxic-appearing or diaphoretic.  HENT:     Head: Normocephalic and atraumatic.  Eyes:     General: No scleral icterus.       Right eye: No discharge.        Left eye: No discharge.  Abdominal:     General: There is no distension.     Palpations: Abdomen is soft.     Tenderness: There is no abdominal tenderness. There is no guarding or rebound.  Musculoskeletal:        General: Deformity present. No tenderness.     Left upper leg: He exhibits deformity. He exhibits no swelling.     Right lower leg: No edema.     Left lower leg: No edema.  Neurological:     Mental Status: He is alert and oriented to person, place, and time. Mental  status is at baseline.     Motor: Weakness, atrophy and abnormal muscle tone present.     Gait: Gait abnormal.       Assessment & Plan:   Please see problem oriented charting.

## 2018-08-03 NOTE — Assessment & Plan Note (Signed)
Assessment  He continues to have left spastic hemiplegia that has resulted in an antalgic gait with subsequent traumatic osteoarthritis of the left knee, left hip, and lower back.  He was felt to have an embolic stroke per neurology upon initial evaluation although work-up was negative.  That said, he prefers to remain on warfarin.  Plan  We are continuing the warfarin for the presumed embolic stroke.  He is followed closely in the anticoagulation clinic.  Today, his INR was slightly subtherapeutic and is being addressed by Dr. Elie Confer in the anticoagulation clinic.

## 2018-08-03 NOTE — Assessment & Plan Note (Signed)
Assessment  As noted elsewhere he has chronic low back pain related to the traumatic arthritis of the left knee and hip from his spastic left hemiplegia.  Modalities to help improve the pain are limited and he is now accepting the need for narcotic medications in order to allow him to remain ambulatory.  Plan  We will prescribe oxycodone-acetaminophen should his current supply from a previous prescription be expired.  We will reassess the efficacy of this therapy and helping to maintain his ability to ambulate and manage his chronic pain at the follow-up visit.

## 2018-08-03 NOTE — Addendum Note (Signed)
Addended by: [name] D on: 08/03/2018 05:49 PM   Modules accepted: Orders

## 2018-08-03 NOTE — Assessment & Plan Note (Signed)
Assessment  He has been using the sorbitol intermittently, and as a result has had continued constipation with hard stools that are pellet-like in nature.  He has purchased magnesium citrate which has been effective in managing his constipation in the past but has been afraid to take it when he takes the sorbitol.  Plan  With the reinitiation of the narcotic pain medication I suspect his constipation will worsen.  He was asked to continue with the sorbitol and use the magnesium citrate as needed even in the presence of the sorbitol.  He was advised that he may need to adjust the sorbitol downward if he were to develop stool frequency at a rate that was greater than he desired.  We will reassess the efficacy of this regimen in the setting of as needed narcotic pain medications at the follow-up visit.

## 2018-08-03 NOTE — Assessment & Plan Note (Signed)
He apparently has recently and been seen by a travel clinic given his plans to return to [place] for several months in the very near future.  He has been given malaria prophylaxis which he plans on taking as prescribed.  He will be returning from [place] at the end of May.  Apparently, during this clinic visit, he was advised to get a Pneumovax.  As he is 64 years old and has a history of stroke with residua this is not unreasonable, especially given his interest in initiating this vaccination series.  He was therefore given the Pneumovax 13 Prevnar formulation today.  This will be followed up with the Pneumovax 23 formulation in 1 year.  Also upon return we will discuss the need for another low-dose CT screening for lung cancer that will be due at that time.

## 2018-08-03 NOTE — Patient Instructions (Signed)
Patient instructed to take medications as defined in the Anti-coagulation Track section of this encounter.  Patient instructed to take today's dose.  Patient instructed to take one (1) tablet of your 5mg peach-colored warfarin tablets by mouth, once-daily at 6PM. Patient verbalized understanding of these instructions.    

## 2018-08-03 NOTE — Progress Notes (Signed)
Anticoagulation Management Alexander English is a 63 y.o. male who reports to the clinic for monitoring of warfarin treatment.    Indication: DVT, History of; Long term current use of anticoagulant.  Duration: indefinite Supervising physician: [name]  Anticoagulation Clinic Visit History: Patient does not report signs/symptoms of bleeding or thromboembolism  Other recent changes: No diet, medications, lifestyle changes endorsed.  Anticoagulation Episode Summary    Current INR goal:   2.0-3.0  TTR:   82.5 % (6.4 y)  Next INR check:   09/03/2018  INR from last check:   1.8! (08/03/2018)  Weekly max warfarin dose:     Target end date:   Indefinite  INR check location:   Anticoagulation Clinic  Preferred lab:     Send INR reminders to:   [facility]   Indications   History of venous thromboembolism [Z86.718]       Comments:           No Known Allergies Prior to Admission medications   Medication Sig Start Date End Date Taking? Authorizing Provider  aspirin EC 81 MG tablet Take 81 mg by mouth daily.   Yes [provider]  atorvastatin (LIPITOR) 10 MG tablet Take 1 tablet (10 mg total) daily by mouth. 05/12/17  Yes [name], MD  benzoyl peroxide 5 % external liquid Apply topically 2 (two) times daily. 08/03/18  Yes [name], MD  fesoterodine (TOVIAZ) 8 MG TB24 tablet Take 1 tablet (8 mg total) by mouth daily. 03/16/18  Yes [name], MD  HYDROcodone-acetaminophen (HYCET) 7.5-325 mg/15 ml solution Take 15 mLs by mouth 4 (four) times daily as needed for moderate pain. 11/24/17  Yes [name], MD  Multiple Vitamin (MULTIVITAMIN) tablet Take 1 tablet by mouth daily.   Yes [provider]  promethazine (PHENERGAN) 25 MG suppository Place 1 suppository (25 mg total) rectally every 6 (six) hours as needed for nausea or vomiting. 11/24/17  Yes [name], MD  sertraline (ZOLOFT) 50 MG tablet Take 1 tablet (50 mg total) by mouth daily. 08/03/18   Yes [name], MD  sorbitol 70 % SOLN Take 15-30 mLs by mouth daily as needed for moderate constipation. 06/29/18  Yes [name], MD  warfarin (COUMADIN) 5 MG tablet Take 1 tablet (5 mg total) by mouth daily at 6 PM. 08/03/18 02/19/19 Yes [name], RPH-CPP  zolpidem (AMBIEN) 5 MG tablet Take 1 tablet (5 mg total) by mouth at bedtime as needed for sleep. 08/03/18  Yes [name], MD   Past Medical History:  Diagnosis Date  . Aortic atherosclerosis ([place]) 10/23/2017   Asymptomatic, seen on CT scan  . Bilateral cataracts 05/18/2016   Not visually significant  . Chronic constipation 06/12/2013  . Chronic low back pain 06/12/2013   L4-5 facet arthropathy   . Chronic pain of both shoulders 12/07/2013   A/C joint osteoarthritis   . Cluster headaches    Resolved in 2006  . COPD (chronic obstructive pulmonary disease) ([place])   . Dry eye syndrome, bilateral 05/18/2016  . Embolic stroke involving right middle cerebral artery ([place]) 08/08/2004   Occured after traveling from [place] to [place] where he was hospitalized for 1 month with no further work-up or therapy.  Returned to [place] unable to walk and was admitted to [facility] immediately after landing. Presumed embolic per neuro but TEE, bubble study, and hypercoag W/U negative. On indefinite coumadin per patient's informed preference.  Residual effects : Left spastic hemiplegia. Tx with phenol tibi  .  History of kidney stones   . Hyperplastic colon polyp 07/27/2012   Excised endoscopically 07/27/2012  . Hypertensive retinopathy of both eyes 05/18/2016  . Internal and external hemorrhoids without complication 6/73/4193   Seen on colonoscopy 04/03/2007.  Marland Kitchen Left nephrolithiasis 08/31/2015   With mild left hydronephrosis.  Scheduled to undergo shockwave lithotripsy.  . Obesity (BMI 30.0-34.9) 12/07/2013  . Osteoarthritis of both knees 01/26/2012  . Positive PPD 12/07/2013  . Pulmonary embolism ([place]) 09/30/2004   Diagnosed in April 2006 after returning from  [place].  Likely provoked as it occurred after a 12 hour plane flight within 2 months of R MCA CVA with left hemiparesis. Patient has made an informed decision to remain on lifelong warfarin.   . Tubular adenoma 12/19/2017   Endoscopically excised 04/03/2007.  Marland Kitchen Urge incontinence of urine 08/31/2015   Possibly secondary to prior CVA.  Treated with Myrbetriq.   Social History   Socioeconomic History  . Marital status: Divorced    Spouse name: Not on file  . Number of children: Not on file  . Years of education: Not on file  . Highest education level: Not on file  Occupational History  . Not on file  Social Needs  . Financial resource strain: Not on file  . Food insecurity:    Worry: Not on file    Inability: Not on file  . Transportation needs:    Medical: Not on file    Non-medical: Not on file  Tobacco Use  . Smoking status: Former Smoker    Last attempt to quit: 06/28/2007    Years since quitting: 11.1  . Smokeless tobacco: Never Used  Substance and Sexual Activity  . Alcohol use: No    Alcohol/week: 0.0 standard drinks    Comment: Remote past  . Drug use: No  . Sexual activity: Not on file  Lifestyle  . Physical activity:    Days per week: Not on file    Minutes per session: Not on file  . Stress: Not on file  Relationships  . Social connections:    Talks on phone: Not on file    Gets together: Not on file    Attends religious service: Not on file    Active member of club or organization: Not on file    Attends meetings of clubs or organizations: Not on file    Relationship status: Not on file  Other Topics Concern  . Not on file  Social History Narrative   Born in [place] but has been in the [place] for > 30 years.  Divorced, 1 daughter and 2 sons.  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   Family History  Problem Relation Age of Onset  . Stroke Maternal Grandmother   . Unexplained death Mother   . Depression Mother         After husband's death  . Unexplained death Father   . Osteoarthritis Father        Knees  . Early death Sister   . Seizures Sister   . Early death Brother 34       Unknown cause  . Healthy Daughter   . Healthy Son   . Stroke Maternal Aunt   . Healthy Sister   . Healthy Sister   . Unexplained death Brother   . Healthy Brother   . Healthy Son     ASSESSMENT Recent Results: The most recent result is correlated with 30 mg  per week: Lab Results  Component Value Date   INR 1.8 (A) 08/03/2018   INR 2.6 07/02/2018   INR 2.3 06/11/2018    Anticoagulation Dosing: Description   Take one (1) tablet of your 5mg  peach-colored warfarin tablets by mouth, once-daily at 6PM.         INR today: Subtherapeutic  PLAN Weekly dose was increased by 16% to 35 mg per week  Patient Instructions  Patient instructed to take medications as defined in the Anti-coagulation Track section of this encounter.  Patient instructed to take today's dose.  Patient instructed to take  one (1) tablet of your 5mg  peach-colored warfarin tablets by mouth, once-daily at [facility].   Patient verbalized understanding of these instructions.     Patient advised to contact clinic or seek medical attention if signs/symptoms of bleeding or thromboembolism occur.  Patient verbalized understanding by repeating back information and was advised to contact me if further medication-related questions arise. Patient was also provided an information handout.  Follow-up Return in 31 days (on 09/03/2018) for Follow up INR at 0930h.  [name], PharmD, CPP  15 minutes spent face-to-face with the patient during the encounter. 50% of time spent on education. 50% of time was spent on fingerstick point of care INR sample collection, processing, results determination, dose adjustment, electronic refill authorization and documentation in [email or web address]

## 2018-08-03 NOTE — Assessment & Plan Note (Signed)
Assessment  With his left spastic hemiplegia over time he has developed traumatic osteoarthritis of the left knee, left hip, and lower back related to an antalgic gait.  This has progressed to the point that it is no longer responsive to nonsteroidal anti-inflammatories, topical Voltaren gel, tramadol, or even steroid injections.  He has been seen by orthopedic surgeon who feels he is not a candidate for joint replacement therapy because of his continuing left spastic hemiplegia.  He has tried Percocet periodically with some relief.  In the past he has been hesitant to take this medication frequently because of concerns for addiction.  We had a long discussion about the limited armamentarium that we have remaining that may provide him with some relief.  I encouraged him to reconsider the narcotic pain medications in order to improve his functionality and quality of life by decreasing his pain.  He now agrees that with no relief from other modalities this is likely all that is left.  Plan  He has some oxycodone from a previous prescription although he is unsure of how old it is.  I asked him to look at the bottle when he got home and to call and leave a message with the expiration date.  If it has expired I told him I would write him for another prescription for oxycodone-acetaminophen and assess the efficacy of this as needed medication in allowing him to remain ambulatory.

## 2018-08-03 NOTE — Telephone Encounter (Signed)
I called Mr. Carton and we decided to write for a new prescription given all of the bottles he had were expired.  Review of the [place] Controlled Substance Database was w/o red flags.  His previous dose was oxycodone-acetaminophen 5mg -325 mg 1 tablet Q8H PRN Pain (Disp #30) for his acute post surgical pain.  We decided to prescribe oxycodone-acetaminophen 5 mg-325 mg 1 tablet Q12 PRN pain (Disp #60) for his chronic pain.  I will ask that he sign a narcotic pain contract upon his return from [place] in June.

## 2018-08-03 NOTE — Assessment & Plan Note (Signed)
Assessment  With the generalized anxiety that has started after his cancer diagnosis and the distress that it is causing him he is a candidate for SSRI therapy in hopes of improving his quality of life by decreasing his generalized anxiety.  Plan  We started sertraline 50 mg by mouth daily.  He was told this may take up to 3 weeks to have any effect and to continue the therapy at least until then to assess efficacy.  He was also told we can make further adjustments in his dose should he received some relief on the 50 mg dose.  This will be reassessed at his follow-up visit.

## 2018-08-03 NOTE — Assessment & Plan Note (Signed)
Assessment  His blood pressure today was elevated above target.  He does not have a previous history of hypertension.  The increased blood pressure may be secondary to the pain from the osteoarthritis as noted above or from his general anxiety.  That said, we are addressing those issues and if he continues to have an elevated blood pressure at the follow-up visit we will formally diagnose him with essential hypertension.  Plan  If his blood pressure is elevated at the follow-up visit despite therapy for both his generalized anxiety and pain he will be diagnosed with essential hypertension and therapy will be initiated.

## 2018-08-03 NOTE — Telephone Encounter (Signed)
Call from pt stating he was asked to call back with the expiration dates of some "pain medications" he has at home.   Bottle # 1 expired 09/06/2016 Bottle #2  expired 03/31/2018 Bottle #3  expired 05/12/2018 Will forward info to pcp.[email or web address] Hidden Cassady2/7/20204:24 PM   Pt can be reached at [phone]

## 2018-08-08 ENCOUNTER — Ambulatory Visit (INDEPENDENT_AMBULATORY_CARE_PROVIDER_SITE_OTHER): Payer: Medicare HMO | Admitting: Internal Medicine

## 2018-08-08 ENCOUNTER — Encounter: Payer: Self-pay | Admitting: Internal Medicine

## 2018-08-08 ENCOUNTER — Other Ambulatory Visit: Payer: Self-pay

## 2018-08-08 DIAGNOSIS — L720 Epidermal cyst: Secondary | ICD-10-CM | POA: Diagnosis not present

## 2018-08-08 NOTE — Patient Instructions (Signed)
Alexander English,   It was a pleasure to meet you. I have referred you to a general surgery for the cyst on your neck. This cyst is benign, but can be removed since it is bothersome to you. You will be called to schedule an appointment.   If you have any questions or concerns, call our clinic at [phone] or after hours call [phone] and ask for the internal medicine resident on call. Thank you!  Dr. Philipp Ovens    Epidermal Cyst  An epidermal cyst is a small, painless lump under your skin. The cyst contains a grayish-white, bad-smelling substance (keratin). Do not try to pop or open an epidermal cyst yourself. What are the causes?  A blocked hair follicle.  A hair that curls and re-enters the skin instead of growing straight out of the skin.  A blocked pore.  Irritated skin.  An injury to the skin.  Certain conditions that are passed along from parent to child (inherited).  Human papillomavirus (HPV).  Long-term sun damage to the skin. What increases the risk?  Having acne.  Being overweight.  Being 55-83 years old. What are the signs or symptoms? These cysts are usually harmless, but they can get infected. Symptoms of infection may include:  Redness.  Inflammation.  Tenderness.  Warmth.  Fever.  A grayish-white, bad-smelling substance drains from the cyst.  Pus drains from the cyst. How is this treated? In many cases, epidermal cysts go away on their own without treatment. If a cyst becomes infected, treatment may include:  Opening and draining the cyst, done by a doctor. After draining, you may need minor surgery to remove the rest of the cyst.  Antibiotic medicine.  Shots of medicines (steroids) that help to reduce inflammation.  Surgery to remove the cyst. Surgery may be done if the cyst: ? Becomes large. ? Bothers you. ? Has a chance of turning into cancer.  Do not try to open a cyst yourself. Follow these instructions at home:  Take  over-the-counter and prescription medicines only as told by your doctor.  If you were prescribed an antibiotic medicine, take it it as told by your doctor. Do not stop using the antibiotic even if you start to feel better.  Keep the area around your cyst clean and dry.  Wear loose, dry clothing.  Avoid touching your cyst.  Check your cyst every day for signs of infection. Check for: ? Redness, swelling, or pain. ? Fluid or blood. ? Warmth. ? Pus or a bad smell.  Keep all follow-up visits as told by your doctor. This is important. How is this prevented?  Wear clean, dry, clothing.  Avoid wearing tight clothing.  Keep your skin clean and dry. Take showers or baths every day. Contact a doctor if:  Your cyst has symptoms of infection.  Your condition does not improve or gets worse.  You have a cyst that looks different from other cysts you have had.  You have a fever. Get help right away if:  Redness spreads from the cyst into the area close by. Summary  An epidermal cyst is a sac made of skin tissue.  If a cyst becomes infected, treatment may include surgery to open and drain the cyst, or to remove it.  Take over-the-counter and prescription medicines only as told by your doctor.  Contact a doctor if your condition is not improving or is getting worse.  Keep all follow-up visits as told by your doctor. This is important. This information  is not intended to replace advice given to you by your health care provider. Make sure you discuss any questions you have with your health care provider. Document Released: 07/21/2004 Document Revised: 12/25/2017 Document Reviewed: 04/15/2015 Elsevier Interactive Patient Education  2019 [employer].

## 2018-08-08 NOTE — Progress Notes (Signed)
   CC: neck lump  HPI:  [name] is a 63 y.o. male with past medical history outlined below here for evaluation of a lump on his neck. For the details of today's visit, please refer to the assessment and plan.  Past Medical History:  Diagnosis Date  . Aortic atherosclerosis ([place]) 10/23/2017   Asymptomatic, seen on CT scan  . Bilateral cataracts 05/18/2016   Not visually significant  . Chronic constipation 06/12/2013  . Chronic low back pain 06/12/2013   L4-5 facet arthropathy   . Chronic pain of both shoulders 12/07/2013   A/C joint osteoarthritis   . Cluster headaches    Resolved in 2006  . COPD (chronic obstructive pulmonary disease) ([place])   . Dry eye syndrome, bilateral 05/18/2016  . Embolic stroke involving right middle cerebral artery ([place]) 08/08/2004   Occured after traveling from [place] to [place] where he was hospitalized for 1 month with no further work-up or therapy.  Returned to [place] unable to walk and was admitted to [facility] immediately after landing. Presumed embolic per neuro but TEE, bubble study, and hypercoag W/U negative. On indefinite coumadin per patient's informed preference.  Residual effects : Left spastic hemiplegia. Tx with phenol tibi  . Generalized anxiety disorder 08/03/2018  . History of kidney stones   . Hyperplastic colon polyp 07/27/2012   Excised endoscopically 07/27/2012  . Hypertensive retinopathy of both eyes 05/18/2016  . Internal and external hemorrhoids without complication 3/54/6568   Seen on colonoscopy 04/03/2007.  Marland Kitchen Left nephrolithiasis 08/31/2015   With mild left hydronephrosis.  Scheduled to undergo shockwave lithotripsy.  . Obesity (BMI 30.0-34.9) 12/07/2013  . Osteoarthritis of both knees 01/26/2012  . Positive PPD 12/07/2013  . Pulmonary embolism ([place]) 09/30/2004   Diagnosed in April 2006 after returning from [place].  Likely provoked as it occurred after a 12 hour plane flight within 2 months of R MCA CVA with left hemiparesis. Patient has  made an informed decision to remain on lifelong warfarin.   . Tubular adenoma 12/19/2017   Endoscopically excised 04/03/2007.  Marland Kitchen Urge incontinence of urine 08/31/2015   Possibly secondary to prior CVA.  Treated with Myrbetriq.    Review of Systems  Constitutional: Negative for chills and fever.    Physical Exam:  Vitals:   08/08/18 1601  BP: 135/73  Pulse: 81  Temp: 98.1 F (36.7 C)  TempSrc: Oral  SpO2: 100%  Weight: 222 lb 4.8 oz (100.8 kg)  Height: 6' (1.829 m)    Constitutional: NAD, appears comfortable Neck: 3.25 cm round mobile cyst, non tender, no erythema  Cardiovascular: RRR, no m/r/g Extremities: Warm and well perfused. No edema.  Psychiatric: Normal mood and affect  Assessment & Plan:   See Encounters Tab for problem based charting.  Patient seen with Dr. Eppie Gibson

## 2018-08-08 NOTE — Assessment & Plan Note (Signed)
Patient is here with complaint of a lump on the back of his neck that developed 5 days ago and has been enlarging.  On exam he has a round 3 cm mobile, firm, cystic structure just under the skin.  There is no erythema and it is nontender to palpation.  He denies fever and chills.  Reassured patient that finding is likely benign, consistent with an epidermal inclusion cyst.  It is not painful or physically bothersome, however patient is worried about malignancy and is traveling out of the country next month back home to [place].  He wishes to have it excised so that he does not worry. -Referral to general surgery

## 2018-08-09 NOTE — Progress Notes (Signed)
I saw and evaluated the patient.  I personally confirmed the key portions of Dr. Guilloud's history and exam and reviewed pertinent patient test results.  The assessment, diagnosis, and plan were formulated together and I agree with the documentation in the resident's note. 

## 2018-08-15 ENCOUNTER — Telehealth: Payer: Self-pay

## 2018-08-15 NOTE — Telephone Encounter (Signed)
Received TC from patient c/o non-productive cough, sneezing, sore throat X 3-4 days. Pt has not checked his temp.  Denies any SOB.   Has taken Allegra without any relief.  Appt made in [facility] for tomorrow at 0915 for evaluation. SChaplin, RN,BSN

## 2018-08-15 NOTE — Telephone Encounter (Signed)
Thank you for scheduling and seeing him in [facility].

## 2018-08-16 ENCOUNTER — Ambulatory Visit (INDEPENDENT_AMBULATORY_CARE_PROVIDER_SITE_OTHER): Payer: Medicare HMO | Admitting: Internal Medicine

## 2018-08-16 ENCOUNTER — Other Ambulatory Visit: Payer: Self-pay

## 2018-08-16 ENCOUNTER — Encounter: Payer: Self-pay | Admitting: Internal Medicine

## 2018-08-16 VITALS — BP 142/80 | HR 85 | Temp 98.3°F | Ht 72.0 in | Wt 224.6 lb

## 2018-08-16 DIAGNOSIS — J069 Acute upper respiratory infection, unspecified: Secondary | ICD-10-CM

## 2018-08-16 MED ORDER — GUAIFENESIN 100 MG/5ML PO SOLN: 5.0000 mL | ORAL | 0 refills | Status: DC | PRN

## 2018-08-16 NOTE — Assessment & Plan Note (Signed)
Assessment: His symptoms of productive cough, rhinorrhea, sore throat, and congestion with unremarkable physical exam and vital signs are consistent with a viral upper respiratory infection.  Will provide symptom management with continued Allegra-D, ibuprofen, saline nasal spray, and guainfencen.  Plan:  1.  Continue Allegra-D 2.  Start ibuprofen 1-2 tablets every 4-6 hours PRN sore throat.  3.  Start saline nasal spray irrigation 4.  Sent rx for Guanfacine PRN cough.

## 2018-08-16 NOTE — Progress Notes (Signed)
CC: Cough and congestion  HPI:  Mr.Brownie MATTHE SLOANE is a 63 y.o. male with past medical history as outlined below who presents with for cough and congestion.  He states that 3 days ago he started to have a productive cough with yellow phlegm, rhinorrhea, sore throat, and congestion.  He has been taking Allegra-D with some alleviation of his symptoms.  He does have an intermittent cough and congestion but the rhinorrhea has improved significantly.  His biggest complaint today is a sore throat.  He denies fevers, shortness of breath, sick contacts.  He does not have any other acute complaints.  Past Medical History:  Diagnosis Date  . Aortic atherosclerosis ([place]) 10/23/2017   Asymptomatic, seen on CT scan  . Bilateral cataracts 05/18/2016   Not visually significant  . Chronic constipation 06/12/2013  . Chronic low back pain 06/12/2013   L4-5 facet arthropathy   . Chronic pain of both shoulders 12/07/2013   A/C joint osteoarthritis   . Cluster headaches    Resolved in 2006  . COPD (chronic obstructive pulmonary disease) ([place])   . Dry eye syndrome, bilateral 05/18/2016  . Embolic stroke involving right middle cerebral artery ([place]) 08/08/2004   Occured after traveling from [place] to [place] where he was hospitalized for 1 month with no further work-up or therapy.  Returned to [place] unable to walk and was admitted to [facility] immediately after landing. Presumed embolic per neuro but TEE, bubble study, and hypercoag W/U negative. On indefinite coumadin per patient's informed preference.  Residual effects : Left spastic hemiplegia. Tx with phenol tibi  . Generalized anxiety disorder 08/03/2018  . History of kidney stones   . Hyperplastic colon polyp 07/27/2012   Excised endoscopically 07/27/2012  . Hypertensive retinopathy of both eyes 05/18/2016  . Internal and external hemorrhoids without complication 6/72/0947   Seen on colonoscopy 04/03/2007.  Marland Kitchen Left nephrolithiasis 08/31/2015   With mild left  hydronephrosis.  Scheduled to undergo shockwave lithotripsy.  . Obesity (BMI 30.0-34.9) 12/07/2013  . Osteoarthritis of both knees 01/26/2012  . Positive PPD 12/07/2013  . Pulmonary embolism ([place]) 09/30/2004   Diagnosed in April 2006 after returning from [place].  Likely provoked as it occurred after a 12 hour plane flight within 2 months of R MCA CVA with left hemiparesis. Patient has made an informed decision to remain on lifelong warfarin.   . Tubular adenoma 12/19/2017   Endoscopically excised 04/03/2007.  Marland Kitchen Urge incontinence of urine 08/31/2015   Possibly secondary to prior CVA.  Treated with Myrbetriq.   Review of Systems:   Review of Systems  Constitutional: Negative for chills and fever.  HENT: Positive for congestion and sore throat. Negative for ear pain and sinus pain.   Respiratory: Negative for cough and shortness of breath.   Cardiovascular: Negative for chest pain and palpitations.  Gastrointestinal: Negative for abdominal pain, constipation, diarrhea and vomiting.  Genitourinary: Negative for dysuria, frequency, hematuria and urgency.  Neurological: Negative for dizziness and headaches.  All other systems reviewed and are negative.   Physical Exam:  Vitals:   08/16/18 0904  BP: (!) 142/80  Pulse: 85  Temp: 98.3 F (36.8 C)  TempSrc: Oral  SpO2: 100%  Weight: 224 lb 9.6 oz (101.9 kg)  Height: 6' (1.829 m)   Physical Exam Vitals signs reviewed.  Constitutional:      Appearance: He is well-developed.  HENT:     Head: Normocephalic and atraumatic.     Nose: Congestion present. No rhinorrhea.  Mouth/Throat:     Mouth: Mucous membranes are moist.     Pharynx: Oropharynx is clear. No pharyngeal swelling or posterior oropharyngeal erythema.  Cardiovascular:     Rate and Rhythm: Normal rate and regular rhythm.  Pulmonary:     Effort: Pulmonary effort is normal. No respiratory distress.     Breath sounds: Normal breath sounds.  Abdominal:     General: Bowel sounds  are normal. There is no distension.     Palpations: Abdomen is soft.  Skin:    General: Skin is warm and dry.  Neurological:     Mental Status: He is alert and oriented to person, place, and time.  Psychiatric:        Mood and Affect: Mood normal.        Behavior: Behavior normal.     Assessment & Plan:   See Encounters Tab for problem based charting.  Patient discussed with Dr. Daryll Drown

## 2018-08-16 NOTE — Patient Instructions (Signed)
Please use ibuprofen 1-2 tablets every 4-6 hours for the sore throat.  For the congestion, please use saline nasal spray that you can get over the counter. I have sent in a prescription for Guanfacine for your cough.  Continue taking Allegra-D.

## 2018-08-20 NOTE — Progress Notes (Signed)
Internal Medicine Clinic Attending  Case discussed with Dr. Prince at the time of the visit.  We reviewed the resident's history and exam and pertinent patient test results.  I agree with the assessment, diagnosis, and plan of care documented in the resident's note.   

## 2018-08-24 DIAGNOSIS — H40023 Open angle with borderline findings, high risk, bilateral: Secondary | ICD-10-CM | POA: Diagnosis not present

## 2018-08-28 DIAGNOSIS — Z8673 Personal history of transient ischemic attack (TIA), and cerebral infarction without residual deficits: Secondary | ICD-10-CM | POA: Diagnosis not present

## 2018-08-28 DIAGNOSIS — C310 Malignant neoplasm of maxillary sinus: Secondary | ICD-10-CM | POA: Diagnosis not present

## 2018-08-28 DIAGNOSIS — Z7901 Long term (current) use of anticoagulants: Secondary | ICD-10-CM | POA: Diagnosis not present

## 2018-08-28 DIAGNOSIS — J069 Acute upper respiratory infection, unspecified: Secondary | ICD-10-CM | POA: Diagnosis not present

## 2018-08-30 ENCOUNTER — Other Ambulatory Visit: Payer: Self-pay | Admitting: Internal Medicine

## 2018-08-30 DIAGNOSIS — E785 Hyperlipidemia, unspecified: Secondary | ICD-10-CM

## 2018-09-03 ENCOUNTER — Ambulatory Visit (INDEPENDENT_AMBULATORY_CARE_PROVIDER_SITE_OTHER): Payer: Medicare HMO

## 2018-09-03 DIAGNOSIS — Z5181 Encounter for therapeutic drug level monitoring: Secondary | ICD-10-CM | POA: Diagnosis not present

## 2018-09-03 DIAGNOSIS — Z86718 Personal history of other venous thrombosis and embolism: Secondary | ICD-10-CM | POA: Diagnosis not present

## 2018-09-03 DIAGNOSIS — Z7901 Long term (current) use of anticoagulants: Secondary | ICD-10-CM | POA: Diagnosis not present

## 2018-09-03 DIAGNOSIS — Z86711 Personal history of pulmonary embolism: Secondary | ICD-10-CM

## 2018-09-03 LAB — POCT INR: INR: 2.7 (ref 2.0–3.0)

## 2018-09-03 NOTE — Patient Instructions (Signed)
Patient instructed to take medications as defined in the Anti-coagulation Track section of this encounter.  Patient instructed to take today's dose.  Patient instructed to take one (1) tablet of your 5mg peach-colored warfarin tablets by mouth, once-daily at 6PM. Patient verbalized understanding of these instructions.    

## 2018-09-03 NOTE — Progress Notes (Signed)
INTERNAL MEDICINE TEACHING ATTENDING ADDENDUM  I agree with pharmacy recommendations as outlined in their note.   Alexander N Raines, MD  

## 2018-09-03 NOTE — Progress Notes (Signed)
Anticoagulation Management Alexander English is a 63 y.o. male who reports to the clinic for monitoring of warfarin treatment.    Indication: Hx of VTE  Duration: indefinite Supervising physician: [name]  Anticoagulation Clinic Visit History: Patient does not report signs/symptoms of bleeding or thromboembolism  Other recent changes: No diet, medications, lifestyle endorsed by patient. Anticoagulation Episode Summary    Current INR goal:   2.0-3.0  TTR:   82.5 % (6.4 y)  Next INR check:   11/05/2018  INR from last check:   2.7 (09/03/2018)  Weekly max warfarin dose:     Target end date:   Indefinite  INR check location:   Anticoagulation Clinic  Preferred lab:     Send INR reminders to:   ANTICOAG IMP   Indications   History of venous thromboembolism [Z86.718]       Comments:           No Known Allergies Prior to Admission medications   Medication Sig Start Date End Date Taking? Authorizing Provider  aspirin EC 81 MG tablet Take 81 mg by mouth daily.    [provider]  atorvastatin (LIPITOR) 10 MG tablet Take 1 tablet (10 mg total) by mouth daily. 08/30/18   [name], MD  benzoyl peroxide 5 % external liquid Apply topically 2 (two) times daily. 08/03/18   [name], MD  fesoterodine (TOVIAZ) 8 MG TB24 tablet Take 1 tablet (8 mg total) by mouth daily. 03/16/18   [name], MD  guaiFENesin (ROBITUSSIN) 100 MG/5ML SOLN Take 5 mLs (100 mg total) by mouth every 4 (four) hours as needed for cough or to loosen phlegm. 08/16/18   Carroll Sage, MD  Multiple Vitamin (MULTIVITAMIN) tablet Take 1 tablet by mouth daily.    [provider]  oxyCODONE-acetaminophen (PERCOCET/ROXICET) 5-325 MG tablet Take 1 tablet by mouth every 12 (twelve) hours as needed for severe pain. 10/02/18   [name], MD  promethazine (PHENERGAN) 25 MG suppository Place 1 suppository (25 mg total) rectally every 6 (six) hours as needed for nausea or vomiting. 11/24/17    [name], MD  sertraline (ZOLOFT) 50 MG tablet Take 1 tablet (50 mg total) by mouth daily. 08/03/18   [name], MD  sorbitol 70 % SOLN Take 15-30 mLs by mouth daily as needed for moderate constipation. 06/29/18   [name], MD  warfarin (COUMADIN) 5 MG tablet Take 1 tablet (5 mg total) by mouth daily at 6 PM. 08/03/18 02/19/19  [name], RPH-CPP  zolpidem (AMBIEN) 5 MG tablet Take 1 tablet (5 mg total) by mouth at bedtime as needed for sleep. 08/03/18   [name], MD   Past Medical History:  Diagnosis Date  . Aortic atherosclerosis ([place]) 10/23/2017   Asymptomatic, seen on CT scan  . Bilateral cataracts 05/18/2016   Not visually significant  . Chronic constipation 06/12/2013  . Chronic low back pain 06/12/2013   L4-5 facet arthropathy   . Chronic pain of both shoulders 12/07/2013   A/C joint osteoarthritis   . Cluster headaches    Resolved in 2006  . COPD (chronic obstructive pulmonary disease) ([place])   . Dry eye syndrome, bilateral 05/18/2016  . Embolic stroke involving right middle cerebral artery ([place]) 08/08/2004   Occured after traveling from [place] to [place] where he was hospitalized for 1 month with no further work-up or therapy.  Returned to [place] unable to walk and was admitted to [facility] immediately after landing. Presumed embolic per neuro but  TEE, bubble study, and hypercoag W/U negative. On indefinite coumadin per patient's informed preference.  Residual effects : Left spastic hemiplegia. Tx with phenol tibi  . Generalized anxiety disorder 08/03/2018  . History of kidney stones   . Hyperplastic colon polyp 07/27/2012   Excised endoscopically 07/27/2012  . Hypertensive retinopathy of both eyes 05/18/2016  . Internal and external hemorrhoids without complication 3/73/4287   Seen on colonoscopy 04/03/2007.  Marland Kitchen Left nephrolithiasis 08/31/2015   With mild left hydronephrosis.  Scheduled to undergo shockwave lithotripsy.  . Obesity (BMI 30.0-34.9) 12/07/2013  . Osteoarthritis of  both knees 01/26/2012  . Positive PPD 12/07/2013  . Pulmonary embolism ([place]) 09/30/2004   Diagnosed in April 2006 after returning from [place].  Likely provoked as it occurred after a 12 hour plane flight within 2 months of R MCA CVA with left hemiparesis. Patient has made an informed decision to remain on lifelong warfarin.   . Tubular adenoma 12/19/2017   Endoscopically excised 04/03/2007.  Marland Kitchen Urge incontinence of urine 08/31/2015   Possibly secondary to prior CVA.  Treated with Myrbetriq.   Social History   Socioeconomic History  . Marital status: Divorced    Spouse name: Not on file  . Number of children: Not on file  . Years of education: Not on file  . Highest education level: Not on file  Occupational History  . Not on file  Social Needs  . Financial resource strain: Not on file  . Food insecurity:    Worry: Not on file    Inability: Not on file  . Transportation needs:    Medical: Not on file    Non-medical: Not on file  Tobacco Use  . Smoking status: Former Smoker    Last attempt to quit: 06/28/2007    Years since quitting: 11.1  . Smokeless tobacco: Never Used  Substance and Sexual Activity  . Alcohol use: No    Alcohol/week: 0.0 standard drinks    Comment: Remote past  . Drug use: No  . Sexual activity: Not on file  Lifestyle  . Physical activity:    Days per week: Not on file    Minutes per session: Not on file  . Stress: Not on file  Relationships  . Social connections:    Talks on phone: Not on file    Gets together: Not on file    Attends religious service: Not on file    Active member of club or organization: Not on file    Attends meetings of clubs or organizations: Not on file    Relationship status: Not on file  Other Topics Concern  . Not on file  Social History Narrative   Born in [place] but has been in the [place] for > 30 years.  Divorced, 1 daughter and 2 sons.  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    Family History  Problem Relation Age of Onset  . Stroke Maternal Grandmother   . Unexplained death Mother   . Depression Mother        After husband's death  . Unexplained death Father   . Osteoarthritis Father        Knees  . Early death Sister   . Seizures Sister   . Early death Brother 64       Unknown cause  . Healthy Daughter   . Healthy Son   . Stroke Maternal Aunt   . Healthy Sister   . Healthy  Sister   . Unexplained death Brother   . Healthy Brother   . Healthy Son     ASSESSMENT Recent Results: The most recent result is correlated with 35 mg per week: Lab Results  Component Value Date   INR 2.7 09/03/2018   INR 1.8 (A) 08/03/2018   INR 2.6 07/02/2018    Anticoagulation Dosing: Description   Take one (1) tablet of your 5mg  peach-colored warfarin tablets by mouth, once-daily at 6PM.         INR today: Therapeutic  PLAN Weekly dose was unchanged.  Patient Instructions  Patient instructed to take medications as defined in the Anti-coagulation Track section of this encounter.  Patient instructed to take today's dose.  Patient instructed to take one (1) tablet of your 5mg  peach-colored warfarin tablets by mouth, once-daily at [facility].   Patient verbalized understanding of these instructions.      Patient advised to contact clinic or seek medical attention if signs/symptoms of bleeding or thromboembolism occur.  Patient verbalized understanding by repeating back information and was advised to contact me if further medication-related questions arise. Patient was also provided an information handout.  Follow-up Return in about 9 weeks (around 11/05/2018) for INR follow up.  Harrietta Guardian, PharmD PGY1 Pharmacy Resident 09/03/2018    10:49 AM Please check AMION for all [place] numbers  15 minutes spent face-to-face with the patient during the encounter. 50% of time spent on education, including signs/sx bleeding and clotting, as well as food and drug  interactions with warfarin. 50% of time was spent on fingerprick POC INR sample collection,processing, results determination, and documentation in [email or web address]/.

## 2018-09-05 NOTE — Addendum Note (Signed)
Addended by: Hulan Fray on: 09/05/2018 08:03 PM   Modules accepted: Orders

## 2018-09-12 ENCOUNTER — Encounter: Payer: Self-pay | Admitting: Pharmacist

## 2018-09-12 ENCOUNTER — Other Ambulatory Visit: Payer: Self-pay | Admitting: Pharmacist

## 2018-09-12 DIAGNOSIS — Z86711 Personal history of pulmonary embolism: Secondary | ICD-10-CM

## 2018-09-12 DIAGNOSIS — Z7901 Long term (current) use of anticoagulants: Secondary | ICD-10-CM

## 2018-09-12 DIAGNOSIS — Z86718 Personal history of other venous thrombosis and embolism: Secondary | ICD-10-CM

## 2018-09-12 NOTE — Progress Notes (Signed)
protim

## 2018-09-23 ENCOUNTER — Encounter: Payer: Self-pay | Admitting: *Deleted

## 2018-10-29 ENCOUNTER — Other Ambulatory Visit: Payer: Self-pay | Admitting: *Deleted

## 2018-10-29 DIAGNOSIS — Z86711 Personal history of pulmonary embolism: Secondary | ICD-10-CM

## 2018-10-29 DIAGNOSIS — Z7901 Long term (current) use of anticoagulants: Secondary | ICD-10-CM

## 2018-10-29 DIAGNOSIS — Z86718 Personal history of other venous thrombosis and embolism: Secondary | ICD-10-CM

## 2018-11-05 ENCOUNTER — Ambulatory Visit: Payer: Medicare HMO

## 2019-01-14 ENCOUNTER — Ambulatory Visit (INDEPENDENT_AMBULATORY_CARE_PROVIDER_SITE_OTHER): Payer: Medicare HMO | Admitting: Pharmacist

## 2019-01-14 ENCOUNTER — Other Ambulatory Visit: Payer: Self-pay

## 2019-01-14 DIAGNOSIS — Z7901 Long term (current) use of anticoagulants: Secondary | ICD-10-CM

## 2019-01-14 DIAGNOSIS — Z5181 Encounter for therapeutic drug level monitoring: Secondary | ICD-10-CM

## 2019-01-14 DIAGNOSIS — Z86718 Personal history of other venous thrombosis and embolism: Secondary | ICD-10-CM

## 2019-01-14 LAB — POCT INR: INR: 2.5 (ref 2.0–3.0)

## 2019-01-14 NOTE — Patient Instructions (Signed)
Patient instructed to take medications as defined in the Anti-coagulation Track section of this encounter.  Patient instructed to take today's dose.  Patient instructed to take one (1) tablet of your 5mg  peach-colored warfarin tablets by mouth, once-daily at [facility]. Patient verbalized understanding of these instructions.

## 2019-01-14 NOTE — Progress Notes (Signed)
Anticoagulation Management Alexander English is a 63 y.o. male who reports to the clinic for monitoring of warfarin treatment.    Indication: History of venous thromboembolism; Long term-current use of anticoagulant.    Duration: indefinite Supervising physician: Joni Reining  Anticoagulation Clinic Visit History: Patient does not report signs/symptoms of bleeding or thromboembolism  Other recent changes: No diet, medications, lifestyle changes endorsed.  Anticoagulation Episode Summary    Current INR goal:  2.0-3.0  TTR:  83.4 % (6.8 y)  Next INR check:  02/11/2019  INR from last check:  2.5 (01/14/2019)  Weekly max warfarin dose:    Target end date:  Indefinite  INR check location:  Anticoagulation Clinic  Preferred lab:    Send INR reminders to:  ANTICOAG IMP   Indications   History of venous thromboembolism [Z86.718]       Comments:          No Known Allergies  Current Outpatient Medications:  .  aspirin EC 81 MG tablet, Take 81 mg by mouth daily., Disp: , Rfl:  .  atorvastatin (LIPITOR) 10 MG tablet, Take 1 tablet (10 mg total) by mouth daily., Disp: 90 tablet, Rfl: 3 .  benzoyl peroxide 5 % external liquid, Apply topically 2 (two) times daily., Disp: 140 g, Rfl: 11 .  fesoterodine (TOVIAZ) 8 MG TB24 tablet, Take 1 tablet (8 mg total) by mouth daily., Disp: 90 tablet, Rfl: 3 .  guaiFENesin (ROBITUSSIN) 100 MG/5ML SOLN, Take 5 mLs (100 mg total) by mouth every 4 (four) hours as needed for cough or to loosen phlegm., Disp: 236 mL, Rfl: 0 .  Multiple Vitamin (MULTIVITAMIN) tablet, Take 1 tablet by mouth daily., Disp: , Rfl:  .  oxyCODONE-acetaminophen (PERCOCET/ROXICET) 5-325 MG tablet, Take 1 tablet by mouth every 12 (twelve) hours as needed for severe pain., Disp: 60 tablet, Rfl: 0 .  promethazine (PHENERGAN) 25 MG suppository, Place 1 suppository (25 mg total) rectally every 6 (six) hours as needed for nausea or vomiting., Disp: 12 suppository, Rfl: 1 .  sertraline  (ZOLOFT) 50 MG tablet, Take 1 tablet (50 mg total) by mouth daily., Disp: 90 tablet, Rfl: 3 .  sorbitol 70 % SOLN, Take 15-30 mLs by mouth daily as needed for moderate constipation., Disp: 473 mL, Rfl: 11 .  warfarin (COUMADIN) 5 MG tablet, Take 1 tablet (5 mg total) by mouth daily at 6 PM., Disp: 100 tablet, Rfl: 1 .  zolpidem (AMBIEN) 5 MG tablet, Take 1 tablet (5 mg total) by mouth at bedtime as needed for sleep., Disp: 30 tablet, Rfl: 5 Past Medical History:  Diagnosis Date  . Aortic atherosclerosis ([place]) 10/23/2017   Asymptomatic, seen on CT scan  . Bilateral cataracts 05/18/2016   Not visually significant  . Chronic constipation 06/12/2013  . Chronic low back pain 06/12/2013   L4-5 facet arthropathy   . Chronic pain of both shoulders 12/07/2013   A/C joint osteoarthritis   . Cluster headaches    Resolved in 2006  . COPD (chronic obstructive pulmonary disease) ([place])   . Dry eye syndrome, bilateral 05/18/2016  . Embolic stroke involving right middle cerebral artery ([place]) 08/08/2004   Occured after traveling from [place] to [place] where he was hospitalized for 1 month with no further work-up or therapy.  Returned to [place] unable to walk and was admitted to [facility] immediately after landing. Presumed embolic per neuro but TEE, bubble study, and hypercoag W/U negative. On indefinite coumadin per patient's informed preference.  Residual effects :  Left spastic hemiplegia. Tx with phenol tibi  . Generalized anxiety disorder 08/03/2018  . History of kidney stones   . Hyperplastic colon polyp 07/27/2012   Excised endoscopically 07/27/2012  . Hypertensive retinopathy of both eyes 05/18/2016  . Internal and external hemorrhoids without complication 03/19/3006   Seen on colonoscopy 04/03/2007.  Marland Kitchen Left nephrolithiasis 08/31/2015   With mild left hydronephrosis.  Scheduled to undergo shockwave lithotripsy.  . Obesity (BMI 30.0-34.9) 12/07/2013  . Osteoarthritis of both knees 01/26/2012  . Positive PPD 12/07/2013  .  Pulmonary embolism ([place]) 09/30/2004   Diagnosed in April 2006 after returning from [place].  Likely provoked as it occurred after a 12 hour plane flight within 2 months of R MCA CVA with left hemiparesis. Patient has made an informed decision to remain on lifelong warfarin.   . Tubular adenoma 12/19/2017   Endoscopically excised 04/03/2007.  Marland Kitchen Urge incontinence of urine 08/31/2015   Possibly secondary to prior CVA.  Treated with Myrbetriq.   Social History   Socioeconomic History  . Marital status: Divorced    Spouse name: Not on file  . Number of children: Not on file  . Years of education: Not on file  . Highest education level: Not on file  Occupational History  . Not on file  Social Needs  . Financial resource strain: Not on file  . Food insecurity    Worry: Not on file    Inability: Not on file  . Transportation needs    Medical: Not on file    Non-medical: Not on file  Tobacco Use  . Smoking status: Former Smoker    Quit date: 06/28/2007    Years since quitting: 11.5  . Smokeless tobacco: Never Used  Substance and Sexual Activity  . Alcohol use: No    Alcohol/week: 0.0 standard drinks    Comment: Remote past  . Drug use: No  . Sexual activity: Not on file  Lifestyle  . Physical activity    Days per week: Not on file    Minutes per session: Not on file  . Stress: Not on file  Relationships  . Social Herbalist on phone: Not on file    Gets together: Not on file    Attends religious service: Not on file    Active member of club or organization: Not on file    Attends meetings of clubs or organizations: Not on file    Relationship status: Not on file  Other Topics Concern  . Not on file  Social History Narrative   Born in [place] but has been in the [place] for > 30 years.  Divorced, 1 daughter and 2 sons.  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   Family History  Problem Relation Age of Onset  . Stroke Maternal  Grandmother   . Unexplained death Mother   . Depression Mother        After husband's death  . Unexplained death Father   . Osteoarthritis Father        Knees  . Early death Sister   . Seizures Sister   . Early death Brother 46       Unknown cause  . Healthy Daughter   . Healthy Son   . Stroke Maternal Aunt   . Healthy Sister   . Healthy Sister   . Unexplained death Brother   . Healthy Brother   . Healthy Son  ASSESSMENT Recent Results: The most recent result is correlated with 35 mg per week: Lab Results  Component Value Date   INR 2.5 01/14/2019   INR 2.7 09/03/2018   INR 1.8 (A) 08/03/2018    Anticoagulation Dosing: Description   Take one (1) tablet of your 5mg  peach-colored warfarin tablets by mouth, once-daily at 6PM.         INR today: Therapeutic  PLAN Weekly dose was unchanged.   Patient Instructions  Patient instructed to take medications as defined in the Anti-coagulation Track section of this encounter.  Patient instructed to take today's dose.  Patient instructed to take one (1) tablet of your 5mg  peach-colored warfarin tablets by mouth, once-daily at [facility]. Patient verbalized understanding of these instructions.     Patient advised to contact clinic or seek medical attention if signs/symptoms of bleeding or thromboembolism occur.  Patient verbalized understanding by repeating back information and was advised to contact me if further medication-related questions arise. Patient was also provided an information handout.  Follow-up Return in 4 weeks (on 02/11/2019) for Follow up INR.  [name], PharmD, CPP  15 minutes spent face-to-face with the patient during the encounter. 50% of time spent on education, including signs/sx bleeding and clotting, as well as food and drug interactions with warfarin. 50% of time was spent on fingerprick POC INR sample collection,processing, results determination, and documentation in [email or web address]/.

## 2019-01-17 ENCOUNTER — Encounter: Payer: Self-pay | Admitting: Internal Medicine

## 2019-01-17 ENCOUNTER — Ambulatory Visit (INDEPENDENT_AMBULATORY_CARE_PROVIDER_SITE_OTHER): Payer: Medicare HMO | Admitting: Internal Medicine

## 2019-01-17 VITALS — BP 131/82 | HR 98 | Temp 98.0°F | Resp 20

## 2019-01-17 DIAGNOSIS — E669 Obesity, unspecified: Secondary | ICD-10-CM | POA: Diagnosis not present

## 2019-01-17 DIAGNOSIS — E7800 Pure hypercholesterolemia, unspecified: Secondary | ICD-10-CM

## 2019-01-17 DIAGNOSIS — M170 Bilateral primary osteoarthritis of knee: Secondary | ICD-10-CM

## 2019-01-17 DIAGNOSIS — G8929 Other chronic pain: Secondary | ICD-10-CM

## 2019-01-17 DIAGNOSIS — K5909 Other constipation: Secondary | ICD-10-CM | POA: Diagnosis not present

## 2019-01-17 DIAGNOSIS — E785 Hyperlipidemia, unspecified: Secondary | ICD-10-CM | POA: Diagnosis not present

## 2019-01-17 DIAGNOSIS — R7301 Impaired fasting glucose: Secondary | ICD-10-CM | POA: Diagnosis not present

## 2019-01-17 DIAGNOSIS — M545 Low back pain, unspecified: Secondary | ICD-10-CM

## 2019-01-17 DIAGNOSIS — D721 Eosinophilia, unspecified: Secondary | ICD-10-CM

## 2019-01-17 DIAGNOSIS — I700 Atherosclerosis of aorta: Secondary | ICD-10-CM | POA: Diagnosis not present

## 2019-01-17 DIAGNOSIS — I7090 Unspecified atherosclerosis: Secondary | ICD-10-CM | POA: Diagnosis not present

## 2019-01-17 DIAGNOSIS — Z8673 Personal history of transient ischemic attack (TIA), and cerebral infarction without residual deficits: Secondary | ICD-10-CM

## 2019-01-17 DIAGNOSIS — Z79891 Long term (current) use of opiate analgesic: Secondary | ICD-10-CM

## 2019-01-17 DIAGNOSIS — M172 Bilateral post-traumatic osteoarthritis of knee: Secondary | ICD-10-CM

## 2019-01-17 DIAGNOSIS — Z79899 Other long term (current) drug therapy: Secondary | ICD-10-CM

## 2019-01-17 NOTE — Patient Instructions (Signed)
Your blood type is B positive.

## 2019-01-18 LAB — CMP14 + ANION GAP
ALT: 23 IU/L (ref 0–44)
AST: 26 IU/L (ref 0–40)
Albumin/Globulin Ratio: 1.2 (ref 1.2–2.2)
Albumin: 4.3 g/dL (ref 3.8–4.8)
Alkaline Phosphatase: 94 IU/L (ref 39–117)
Anion Gap: 13 mmol/L (ref 10.0–18.0)
BUN/Creatinine Ratio: 9 — ABNORMAL LOW (ref 10–24)
BUN: 9 mg/dL (ref 8–27)
Bilirubin Total: 0.5 mg/dL (ref 0.0–1.2)
CO2: 21 mmol/L (ref 20–29)
Calcium: 10.5 mg/dL — ABNORMAL HIGH (ref 8.6–10.2)
Chloride: 107 mmol/L — ABNORMAL HIGH (ref 96–106)
Creatinine, Ser: 0.99 mg/dL (ref 0.76–1.27)
GFR calc Af Amer: 93 mL/min/{1.73_m2} (ref >59)
GFR calc non Af Amer: 80 mL/min/{1.73_m2} (ref >59)
Globulin, Total: 3.6 g/dL (ref 1.5–4.5)
Glucose: 85 mg/dL (ref 65–99)
Potassium: 4.6 mmol/L (ref 3.5–5.2)
Sodium: 141 mmol/L (ref 134–144)
Total Protein: 7.9 g/dL (ref 6.0–8.5)

## 2019-01-18 LAB — LIPID PANEL
Chol/HDL Ratio: 2.3 ratio (ref 0.0–5.0)
Cholesterol, Total: 114 mg/dL (ref 100–199)
HDL: 50 mg/dL (ref >39)
LDL Calculated: 55 mg/dL (ref 0–99)
Triglycerides: 45 mg/dL (ref 0–149)
VLDL Cholesterol Cal: 9 mg/dL (ref 5–40)

## 2019-01-18 LAB — CBC WITH DIFFERENTIAL/PLATELET
Basophils Absolute: 0 10*3/uL (ref 0.0–0.2)
Basos: 1 %
EOS (ABSOLUTE): 0.7 10*3/uL — ABNORMAL HIGH (ref 0.0–0.4)
Eos: 14 %
Hematocrit: 47.1 % (ref 37.5–51.0)
Hemoglobin: 15.4 g/dL (ref 13.0–17.7)
Immature Grans (Abs): 0 10*3/uL (ref 0.0–0.1)
Immature Granulocytes: 0 %
Lymphocytes Absolute: 2.3 10*3/uL (ref 0.7–3.1)
Lymphs: 50 %
MCH: 25.5 pg — ABNORMAL LOW (ref 26.6–33.0)
MCHC: 32.7 g/dL (ref 31.5–35.7)
MCV: 78 fL — ABNORMAL LOW (ref 79–97)
Monocytes Absolute: 0.4 10*3/uL (ref 0.1–0.9)
Monocytes: 8 %
Neutrophils Absolute: 1.3 10*3/uL — ABNORMAL LOW (ref 1.4–7.0)
Neutrophils: 27 %
Platelets: 229 10*3/uL (ref 150–450)
RBC: 6.04 x10E6/uL — ABNORMAL HIGH (ref 4.14–5.80)
RDW: 15.1 % (ref 11.6–15.4)
WBC: 4.7 10*3/uL (ref 3.4–10.8)

## 2019-01-18 LAB — HEMOGLOBIN A1C
Est. average glucose Bld gHb Est-mCnc: 126 mg/dL
Hgb A1c MFr Bld: 6 % — ABNORMAL HIGH (ref 4.8–5.6)

## 2019-01-21 NOTE — Assessment & Plan Note (Signed)
HPI: In addition to his bilateral knee pain he does complain of low back pain.  I also suspect this is due to the change in kinetics of walking after his stroke and resultant left spastic plegia.  Assessment chronic low back pain  Plan discussed importance of stretching, intermittent application of heat and occasional use of oxycodone acetaminophen for severe pain.

## 2019-01-21 NOTE — Assessment & Plan Note (Signed)
HPI: He reports he has been doing very well with his constipation regimen he mainly takes his sorbitol intermittently to maintain regular bowel movements.  He has had no worsening of his constipation with occasionally taking oxycodone-acetaminophen.  Assessment chronic constipation Continue sorbitol as needed

## 2019-01-21 NOTE — Assessment & Plan Note (Signed)
Repeat lipid panel -Continue lipitor 10 mg daily

## 2019-01-21 NOTE — Assessment & Plan Note (Addendum)
HPI: Continues to complain of bilateral knee pain his left knee is worse than the right.  As previously noticed this is largely due to changing kinetics of walking after a stroke that has immobilized his left side.  He does have a left leg brace and uses a cane to ambulate.  He reports to me that he has used the oxycodone acetaminophen very sparingly on severe pain days.  Overall this is helped him to ambulate short distances on most days.  He recently traveled to [place] and was able to see family.  Assessment bilateral osteoarthritis  Plan continue supportive care as previously noted not a good candidate for knee replacement.  We will continue as needed oxycodone acetaminophen and monitor usage.

## 2019-01-21 NOTE — Assessment & Plan Note (Signed)
Repeat lipid panel obtained we will continue atorvastatin 10 mg daily

## 2019-01-21 NOTE — Progress Notes (Signed)
  Subjective:  HPI: [name] is a 63 y.o. male who presents for f/u knee pain, back pain.  Please see Assessment and Plan below for the status of his chronic medical problems.  Review of Systems: Review of Systems  Constitutional: Negative for fever, malaise/fatigue and weight loss.  Cardiovascular: Negative for chest pain.  Gastrointestinal: Negative for constipation and diarrhea.  Musculoskeletal: Positive for back pain and joint pain. Negative for neck pain.  Neurological: Negative for sensory change, speech change and weakness.  Psychiatric/Behavioral: Negative for memory loss.    Objective:  Physical Exam: Vitals:   01/17/19 1052  BP: 131/82  Pulse: 98  Resp: 20  Temp: 98 F (36.7 C)  TempSrc: Oral  SpO2: 100%   There is no height or weight on file to calculate BMI. Physical Exam Vitals signs and nursing note reviewed.  Constitutional:      Appearance: He is well-developed.  Cardiovascular:     Rate and Rhythm: Normal rate and regular rhythm.  Pulmonary:     Effort: Pulmonary effort is normal.     Breath sounds: Normal breath sounds.  Musculoskeletal:     Right knee: He exhibits normal range of motion, no swelling, no effusion, no LCL laxity, normal patellar mobility, normal meniscus and no MCL laxity. Tenderness found. Medial joint line and lateral joint line tenderness noted.     Left knee: He exhibits no LCL laxity, normal patellar mobility, no bony tenderness, normal meniscus and no MCL laxity. Tenderness found. Medial joint line tenderness noted. No lateral joint line tenderness noted.     Lumbar back: He exhibits tenderness. He exhibits no swelling and no edema.     Comments: Left leg brace in place  Neurological:     Mental Status: He is alert.    Assessment & Plan:  See Encounters Tab for problem based charting.  Medications Ordered No orders of the defined types were placed in this encounter.  Other Orders Orders Placed This Encounter   Procedures  . CMP14 + Anion Gap  . CBC with Diff  . Lipid Profile  . Hemoglobin A1c   Follow Up: Return in about 6 months (around 07/20/2019).

## 2019-01-22 DIAGNOSIS — D721 Eosinophilia, unspecified: Secondary | ICD-10-CM | POA: Insufficient documentation

## 2019-01-22 NOTE — Addendum Note (Signed)
Addended by: Joni Reining C on: 01/22/2019 10:57 AM   Modules accepted: Orders

## 2019-01-22 NOTE — Assessment & Plan Note (Signed)
Abs eso of 700, mildly elevated but with recent travel history to [place].  Discussed with patient will repeat CBC with diff in 2-4 weeks with lab only visit.

## 2019-01-22 NOTE — Assessment & Plan Note (Signed)
Mildly elevated calcium, will recheck ionized Ca level at next draw

## 2019-02-05 ENCOUNTER — Telehealth: Payer: Self-pay | Admitting: Pharmacist

## 2019-02-05 ENCOUNTER — Other Ambulatory Visit: Payer: Self-pay

## 2019-02-05 ENCOUNTER — Other Ambulatory Visit (INDEPENDENT_AMBULATORY_CARE_PROVIDER_SITE_OTHER): Payer: Medicare HMO

## 2019-02-05 DIAGNOSIS — D721 Eosinophilia, unspecified: Secondary | ICD-10-CM

## 2019-02-05 DIAGNOSIS — Z7901 Long term (current) use of anticoagulants: Secondary | ICD-10-CM

## 2019-02-05 LAB — POCT INR: INR: 2.8 (ref 2.0–3.0)

## 2019-02-05 NOTE — Addendum Note (Signed)
Addended by: Truddie Crumble on: 02/05/2019 10:04 AM   Modules accepted: Orders

## 2019-02-05 NOTE — Progress Notes (Signed)
28

## 2019-02-05 NOTE — Telephone Encounter (Signed)
Was called INR result 2.8 (obtained in course of the patient seeing his physician) on 35mg  warfarin/week. Will CONTINUE this regimen of 5mg  PO daily. Next INR 11-Mar-2019 at 0900h.

## 2019-02-06 LAB — CBC WITH DIFFERENTIAL/PLATELET
Basophils Absolute: 0.1 10*3/uL (ref 0.0–0.2)
Basos: 1 %
EOS (ABSOLUTE): 0.6 10*3/uL — ABNORMAL HIGH (ref 0.0–0.4)
Eos: 13 %
Hematocrit: 45.3 % (ref 37.5–51.0)
Hemoglobin: 14.6 g/dL (ref 13.0–17.7)
Immature Grans (Abs): 0 10*3/uL (ref 0.0–0.1)
Immature Granulocytes: 0 %
Lymphocytes Absolute: 2.2 10*3/uL (ref 0.7–3.1)
Lymphs: 44 %
MCH: 25.6 pg — ABNORMAL LOW (ref 26.6–33.0)
MCHC: 32.2 g/dL (ref 31.5–35.7)
MCV: 79 fL (ref 79–97)
Monocytes Absolute: 0.4 10*3/uL (ref 0.1–0.9)
Monocytes: 8 %
Neutrophils Absolute: 1.7 10*3/uL (ref 1.4–7.0)
Neutrophils: 34 %
Platelets: 171 10*3/uL (ref 150–450)
RBC: 5.71 x10E6/uL (ref 4.14–5.80)
RDW: 14.9 % (ref 11.6–15.4)
WBC: 4.9 10*3/uL (ref 3.4–10.8)

## 2019-02-06 LAB — CALCIUM, IONIZED: Calcium, Ion: 5.4 mg/dL (ref 4.5–5.6)

## 2019-02-08 ENCOUNTER — Telehealth: Payer: Self-pay | Admitting: Internal Medicine

## 2019-02-08 NOTE — Telephone Encounter (Signed)
Patient called back and I updated him

## 2019-02-08 NOTE — Telephone Encounter (Signed)
Placed call to inform Alexander English of blood test results (Home and Mobile phone) did not leave message, if he calls back please let him know that his calcium level is normal and that his blood eosinophil count is improved.

## 2019-02-11 ENCOUNTER — Ambulatory Visit: Payer: Medicare HMO

## 2019-02-14 ENCOUNTER — Other Ambulatory Visit: Payer: Self-pay | Admitting: Pharmacist

## 2019-02-14 DIAGNOSIS — Z86711 Personal history of pulmonary embolism: Secondary | ICD-10-CM

## 2019-02-25 DIAGNOSIS — H40023 Open angle with borderline findings, high risk, bilateral: Secondary | ICD-10-CM | POA: Diagnosis not present

## 2019-02-25 DIAGNOSIS — H2513 Age-related nuclear cataract, bilateral: Secondary | ICD-10-CM | POA: Diagnosis not present

## 2019-02-25 DIAGNOSIS — H25013 Cortical age-related cataract, bilateral: Secondary | ICD-10-CM | POA: Diagnosis not present

## 2019-02-25 DIAGNOSIS — H04123 Dry eye syndrome of bilateral lacrimal glands: Secondary | ICD-10-CM | POA: Diagnosis not present

## 2019-03-05 DIAGNOSIS — C310 Malignant neoplasm of maxillary sinus: Secondary | ICD-10-CM | POA: Diagnosis not present

## 2019-03-11 ENCOUNTER — Ambulatory Visit (INDEPENDENT_AMBULATORY_CARE_PROVIDER_SITE_OTHER): Payer: Medicare HMO

## 2019-03-11 ENCOUNTER — Other Ambulatory Visit: Payer: Self-pay

## 2019-03-11 DIAGNOSIS — Z23 Encounter for immunization: Secondary | ICD-10-CM | POA: Diagnosis not present

## 2019-03-11 DIAGNOSIS — Z86711 Personal history of pulmonary embolism: Secondary | ICD-10-CM

## 2019-03-11 DIAGNOSIS — Z7901 Long term (current) use of anticoagulants: Secondary | ICD-10-CM

## 2019-03-11 DIAGNOSIS — Z5181 Encounter for therapeutic drug level monitoring: Secondary | ICD-10-CM

## 2019-03-11 DIAGNOSIS — Z86718 Personal history of other venous thrombosis and embolism: Secondary | ICD-10-CM

## 2019-03-11 LAB — POCT INR: INR: 2.8 (ref 2.0–3.0)

## 2019-03-11 NOTE — Progress Notes (Signed)
Anticoagulation Management Alexander English is a 63 y.o. male who reports to the clinic for monitoring of warfarin treatment.    Indication: PE, history of; DVT, history of; Long-term anticoagulation.  Duration: indefinite Supervising physician: Joni Reining  Anticoagulation Clinic Visit History: Patient does not report signs/symptoms of bleeding or thromboembolism. Other recent changes: no diet, medications, lifestyle changes endorsed.  Anticoagulation Episode Summary    Current INR goal:  2.0-3.0  TTR:  83.8 % (7 y)  Next INR check:  04/22/2019  INR from last check:  2.8 (03/11/2019)  Weekly max warfarin dose:    Target end date:  Indefinite  INR check location:  Anticoagulation Clinic  Preferred lab:    Send INR reminders to:  [facility]   Indications   History of venous thromboembolism [Z86.718]       Comments:          No Known Allergies  Current Outpatient Medications:  .  aspirin EC 81 MG tablet, Take 81 mg by mouth daily., Disp: , Rfl:  .  atorvastatin (LIPITOR) 10 MG tablet, Take 1 tablet (10 mg total) by mouth daily., Disp: 90 tablet, Rfl: 3 .  benzoyl peroxide 5 % external liquid, Apply topically 2 (two) times daily., Disp: 140 g, Rfl: 11 .  fesoterodine (TOVIAZ) 8 MG TB24 tablet, Take 1 tablet (8 mg total) by mouth daily., Disp: 90 tablet, Rfl: 3 .  guaiFENesin (ROBITUSSIN) 100 MG/5ML SOLN, Take 5 mLs (100 mg total) by mouth every 4 (four) hours as needed for cough or to loosen phlegm., Disp: 236 mL, Rfl: 0 .  Multiple Vitamin (MULTIVITAMIN) tablet, Take 1 tablet by mouth daily., Disp: , Rfl:  .  MYRBETRIQ 25 MG TB24 tablet, Take 25 mg by mouth daily., Disp: , Rfl:  .  oxyCODONE-acetaminophen (PERCOCET/ROXICET) 5-325 MG tablet, Take 1 tablet by mouth every 12 (twelve) hours as needed for severe pain., Disp: 60 tablet, Rfl: 0 .  promethazine (PHENERGAN) 25 MG suppository, Place 1 suppository (25 mg total) rectally every 6 (six) hours as needed for nausea or  vomiting., Disp: 12 suppository, Rfl: 1 .  sertraline (ZOLOFT) 50 MG tablet, Take 1 tablet (50 mg total) by mouth daily., Disp: 90 tablet, Rfl: 3 .  sorbitol 70 % SOLN, Take 15-30 mLs by mouth daily as needed for moderate constipation., Disp: 473 mL, Rfl: 11 .  warfarin (COUMADIN) 5 MG tablet, Take one (1) tablet daily at 6PM--EXCEPT on Wednesdays, take only 1/2 tablet., Disp: 100 tablet, Rfl: 0 .  zolpidem (AMBIEN) 5 MG tablet, Take 1 tablet (5 mg total) by mouth at bedtime as needed for sleep., Disp: 30 tablet, Rfl: 5 Past Medical History:  Diagnosis Date  . Aortic atherosclerosis ([place]) 10/23/2017   Asymptomatic, seen on CT scan  . Bilateral cataracts 05/18/2016   Not visually significant  . Chronic constipation 06/12/2013  . Chronic low back pain 06/12/2013   L4-5 facet arthropathy   . Chronic pain of both shoulders 12/07/2013   A/C joint osteoarthritis   . Cluster headaches    Resolved in 2006  . COPD (chronic obstructive pulmonary disease) ([place])   . Dry eye syndrome, bilateral 05/18/2016  . Embolic stroke involving right middle cerebral artery ([place]) 08/08/2004   Occured after traveling from [place] to [place] where he was hospitalized for 1 month with no further work-up or therapy.  Returned to [place] unable to walk and was admitted to [facility] immediately after landing. Presumed embolic per neuro but TEE, bubble study, and  hypercoag W/U negative. On indefinite coumadin per patient's informed preference.  Residual effects : Left spastic hemiplegia. Tx with phenol tibi  . Generalized anxiety disorder 08/03/2018  . History of kidney stones   . Hyperplastic colon polyp 07/27/2012   Excised endoscopically 07/27/2012  . Hypertensive retinopathy of both eyes 05/18/2016  . Internal and external hemorrhoids without complication 0000000   Seen on colonoscopy 04/03/2007.  Marland Kitchen Left nephrolithiasis 08/31/2015   With mild left hydronephrosis.  Scheduled to undergo shockwave lithotripsy.  . Obesity (BMI 30.0-34.9)  12/07/2013  . Osteoarthritis of both knees 01/26/2012  . Positive PPD 12/07/2013  . Pulmonary embolism ([place]) 09/30/2004   Diagnosed in April 2006 after returning from [place].  Likely provoked as it occurred after a 12 hour plane flight within 2 months of R MCA CVA with left hemiparesis. Patient has made an informed decision to remain on lifelong warfarin.   . Tubular adenoma 12/19/2017   Endoscopically excised 04/03/2007.  Marland Kitchen Urge incontinence of urine 08/31/2015   Possibly secondary to prior CVA.  Treated with Myrbetriq.   Social History   Socioeconomic History  . Marital status: Divorced    Spouse name: Not on file  . Number of children: Not on file  . Years of education: Not on file  . Highest education level: Not on file  Occupational History  . Not on file  Social Needs  . Financial resource strain: Not on file  . Food insecurity    Worry: Not on file    Inability: Not on file  . Transportation needs    Medical: Not on file    Non-medical: Not on file  Tobacco Use  . Smoking status: Former Smoker    Quit date: 06/28/2007    Years since quitting: 11.7  . Smokeless tobacco: Never Used  Substance and Sexual Activity  . Alcohol use: No    Alcohol/week: 0.0 standard drinks    Comment: Remote past  . Drug use: No  . Sexual activity: Not on file  Lifestyle  . Physical activity    Days per week: 0 days    Minutes per session: 0 min  . Stress: Only a little  Relationships  . Social connections    Talks on phone: More than three times a week    Gets together: Three times a week    Attends religious service: 1 to 4 times per year    Active member of club or organization: No    Attends meetings of clubs or organizations: Never    Relationship status: Divorced  Other Topics Concern  . Not on file  Social History Narrative   Born in [place] but has been in the [place] for > 30 years.  Divorced, 1 daughter and 2 sons.  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   Family History  Problem Relation Age of Onset  . Stroke Maternal Grandmother   . Unexplained death Mother   . Depression Mother        After husband's death  . Unexplained death Father   . Osteoarthritis Father        Knees  . Early death Sister   . Seizures Sister   . Early death Brother 82       Unknown cause  . Healthy Daughter   . Healthy Son   . Stroke Maternal Aunt   . Healthy Sister   . Healthy Sister   . Unexplained death Brother   .  Healthy Brother   . Healthy Son     ASSESSMENT Recent Results: The most recent result is correlated with 32.5 mg per week: Lab Results  Component Value Date   INR 2.8 03/11/2019   INR 2.8 02/05/2019   INR 2.5 01/14/2019    Anticoagulation Dosing: Description   Take one (1) tablet of your 5mg  peach-colored warfarin tablets by mouth, once-daily except on Wednesday take 1/2 tablet at 6PM.         INR today: Therapeutic  PLAN Weekly dose was unchanged  Patient Instructions  Patient instructed to take medications as defined in the Anti-coagulation Track section of this encounter.  Patient instructed to take today's dose.  Patient was instructed to take one (1) tablet of your 5mg  peach-colored warfarin tablets by mouth, once-daily except on Wednesday take 1/2 tablet at [facility].   Patient verbalized understanding of these instructions.    Patient advised to contact clinic or seek medical attention if signs/symptoms of bleeding or thromboembolism occur.  Patient verbalized understanding by repeating back information and was advised to contact me if further medication-related questions arise. Patient was also provided an information handout.  Follow-up Return in 6 weeks (on 04/22/2019) for follow-up INR at 1000.  Acey Lav, PharmD  PGY1 Acute Care Pharmacy Resident [phone] 15 minutes spent face-to-face with the patient during the encounter. 50% of time spent on education, including signs/sx bleeding  and clotting, as well as food and drug interactions with warfarin. 50% of time was spent on fingerprick POC INR sample collection,processing, results determination, and documentation in http://www.kim.net/.

## 2019-03-11 NOTE — Patient Instructions (Signed)
Patient instructed to take medications as defined in the Anti-coagulation Track section of this encounter.  Patient instructed to take today's dose.  Patient was instructed to take one (1) tablet of your 5mg  peach-colored warfarin tablets by mouth, once-daily except on Wednesday take 1/2 tablet at [facility].   Patient verbalized understanding of these instructions.

## 2019-03-12 DIAGNOSIS — R972 Elevated prostate specific antigen [PSA]: Secondary | ICD-10-CM | POA: Diagnosis not present

## 2019-03-19 DIAGNOSIS — R3915 Urgency of urination: Secondary | ICD-10-CM | POA: Diagnosis not present

## 2019-03-19 DIAGNOSIS — N401 Enlarged prostate with lower urinary tract symptoms: Secondary | ICD-10-CM | POA: Diagnosis not present

## 2019-03-19 DIAGNOSIS — R972 Elevated prostate specific antigen [PSA]: Secondary | ICD-10-CM | POA: Diagnosis not present

## 2019-04-17 ENCOUNTER — Telehealth: Payer: Self-pay | Admitting: *Deleted

## 2019-04-17 NOTE — Telephone Encounter (Addendum)
Patient called in c/o dark urine x 7 days. Denies burning, pain, itching with urination. Denies urinary urgency, frequency. Denies N/V/D. Denies unusual bruising or bleeding anywhere. Patient scheduled for [facility] appt tomorrow at 1015. Last INR 2.8 on 03/11/2019. Has appt in coumadin clinic on 04/22/2019. Hubbard Hartshorn, BSN, RN-BC

## 2019-04-17 NOTE — Telephone Encounter (Signed)
Agree with acc visit for further eval.

## 2019-04-18 ENCOUNTER — Encounter: Payer: Self-pay | Admitting: Internal Medicine

## 2019-04-18 ENCOUNTER — Other Ambulatory Visit: Payer: Self-pay

## 2019-04-18 ENCOUNTER — Ambulatory Visit (INDEPENDENT_AMBULATORY_CARE_PROVIDER_SITE_OTHER): Payer: Medicare HMO | Admitting: Internal Medicine

## 2019-04-18 VITALS — BP 133/76 | HR 76 | Temp 98.1°F | Ht 72.0 in | Wt 236.0 lb

## 2019-04-18 DIAGNOSIS — R319 Hematuria, unspecified: Secondary | ICD-10-CM | POA: Insufficient documentation

## 2019-04-18 DIAGNOSIS — I69354 Hemiplegia and hemiparesis following cerebral infarction affecting left non-dominant side: Secondary | ICD-10-CM | POA: Diagnosis not present

## 2019-04-18 DIAGNOSIS — R3911 Hesitancy of micturition: Secondary | ICD-10-CM | POA: Diagnosis not present

## 2019-04-18 DIAGNOSIS — Z87442 Personal history of urinary calculi: Secondary | ICD-10-CM | POA: Diagnosis not present

## 2019-04-18 DIAGNOSIS — R82998 Other abnormal findings in urine: Secondary | ICD-10-CM

## 2019-04-18 DIAGNOSIS — Z79899 Other long term (current) drug therapy: Secondary | ICD-10-CM

## 2019-04-18 DIAGNOSIS — R310 Gross hematuria: Secondary | ICD-10-CM

## 2019-04-18 DIAGNOSIS — R32 Unspecified urinary incontinence: Secondary | ICD-10-CM

## 2019-04-18 DIAGNOSIS — Z87891 Personal history of nicotine dependence: Secondary | ICD-10-CM | POA: Diagnosis not present

## 2019-04-18 DIAGNOSIS — J449 Chronic obstructive pulmonary disease, unspecified: Secondary | ICD-10-CM | POA: Diagnosis not present

## 2019-04-18 DIAGNOSIS — Z86718 Personal history of other venous thrombosis and embolism: Secondary | ICD-10-CM

## 2019-04-18 LAB — POCT URINALYSIS DIPSTICK
Bilirubin, UA: NEGATIVE
Glucose, UA: NEGATIVE
Ketones, UA: NEGATIVE
Leukocytes, UA: NEGATIVE
Nitrite, UA: NEGATIVE
Protein, UA: NEGATIVE
Spec Grav, UA: 1.02 (ref 1.010–1.025)
Urobilinogen, UA: 0.2 E.U./dL
pH, UA: 7 (ref 5.0–8.0)

## 2019-04-18 LAB — POCT INR: INR: 3.3 — AB (ref 2.0–3.0)

## 2019-04-18 NOTE — Progress Notes (Signed)
   CC: Hematuria  HPI:  [name] is a 63 y.o. M with PMHx listed below presenting for Hematuria. Please see the A&P for the status of the patient's chronic medical problems.   Past Medical History:  Diagnosis Date  . Aortic atherosclerosis ([place]) 10/23/2017   Asymptomatic, seen on CT scan  . Bilateral cataracts 05/18/2016   Not visually significant  . Chronic constipation 06/12/2013  . Chronic low back pain 06/12/2013   L4-5 facet arthropathy   . Chronic pain of both shoulders 12/07/2013   A/C joint osteoarthritis   . Cluster headaches    Resolved in 2006  . COPD (chronic obstructive pulmonary disease) ([place])   . Dry eye syndrome, bilateral 05/18/2016  . Embolic stroke involving right middle cerebral artery ([place]) 08/08/2004   Occured after traveling from [place] to [place] where he was hospitalized for 1 month with no further work-up or therapy.  Returned to [place] unable to walk and was admitted to [facility] immediately after landing. Presumed embolic per neuro but TEE, bubble study, and hypercoag W/U negative. On indefinite coumadin per patient's informed preference.  Residual effects : Left spastic hemiplegia. Tx with phenol tibi  . Generalized anxiety disorder 08/03/2018  . History of kidney stones   . Hyperplastic colon polyp 07/27/2012   Excised endoscopically 07/27/2012  . Hypertensive retinopathy of both eyes 05/18/2016  . Internal and external hemorrhoids without complication 0000000   Seen on colonoscopy 04/03/2007.  Marland Kitchen Left nephrolithiasis 08/31/2015   With mild left hydronephrosis.  Scheduled to undergo shockwave lithotripsy.  . Obesity (BMI 30.0-34.9) 12/07/2013  . Osteoarthritis of both knees 01/26/2012  . Positive PPD 12/07/2013  . Pulmonary embolism ([employer]) 09/30/2004   Diagnosed in April 2006 after returning from [place].  Likely provoked as it occurred after a 12 hour plane flight within 2 months of R MCA CVA with left hemiparesis. Patient has made an informed decision to remain  on lifelong warfarin.   . Tubular adenoma 12/19/2017   Endoscopically excised 04/03/2007.  Marland Kitchen Urge incontinence of urine 08/31/2015   Possibly secondary to prior CVA.  Treated with Myrbetriq.   Review of Systems:  Performed and all others negative.  Physical Exam:  There were no vitals filed for this visit. Physical Exam Constitutional:      General: He is not in acute distress.    Appearance: Normal appearance.  Cardiovascular:     Rate and Rhythm: Normal rate and regular rhythm.     Pulses: Normal pulses.     Heart sounds: Normal heart sounds.  Pulmonary:     Effort: Pulmonary effort is normal. No respiratory distress.     Breath sounds: Normal breath sounds.  Abdominal:     General: Bowel sounds are normal. There is no distension.     Palpations: Abdomen is soft.     Tenderness: There is no abdominal tenderness.  Musculoskeletal:        General: No swelling.     Comments: Left knee brace in place  Skin:    General: Skin is warm and dry.  Neurological:     Mental Status: Mental status is at baseline.     Comments: Residual L-sided deficits from prior CVA    Assessment & Plan:   See Encounters Tab for problem based charting.  Patient discussed with Dr. Evette Doffing

## 2019-04-18 NOTE — Patient Instructions (Addendum)
Thank you for allowing [place] to care for you  For your dark urine - Initial urine studies show some blood in your urine, but no sign of infection - Your INR is mildly elevated, we will send results to Dr. Elie Confer for dosing  Please schedule a Urology Appointment - for further evaluation, to rule out bladder mass or renal stones

## 2019-04-18 NOTE — Assessment & Plan Note (Addendum)
Patient notes 7 days of dark urine. He denies urinary frequency or dysuria. He has not a change in his urinary stream, he has some chronic hesitancy while on Toviaz and Myrbetriq for incontinence. He states he read online that dark urine could be due to blueberries (which he has been eating this week). He states his urine is clearer today that it had been before.  POC Urine Dip showed large blood, but was otherwise normal and POC INR was only mildly elevated at 3.3. Will perform formal UA and have patient visit his urologist for further evaluation to rule out possible bladder mass (given his history of smoking) and Renal stones (given history of stones and presentation). Renal stones are less likely given lack of pain on presentation. - Urinalysis - Urology follow up

## 2019-04-19 DIAGNOSIS — R3915 Urgency of urination: Secondary | ICD-10-CM | POA: Diagnosis not present

## 2019-04-19 DIAGNOSIS — R310 Gross hematuria: Secondary | ICD-10-CM | POA: Diagnosis not present

## 2019-04-19 LAB — URINALYSIS, ROUTINE W REFLEX MICROSCOPIC
Bilirubin, UA: NEGATIVE
Glucose, UA: NEGATIVE
Ketones, UA: NEGATIVE
Leukocytes,UA: NEGATIVE
Nitrite, UA: NEGATIVE
Protein,UA: NEGATIVE
Specific Gravity, UA: 1.017 (ref 1.005–1.030)
Urobilinogen, Ur: 0.2 mg/dL (ref 0.2–1.0)
pH, UA: 7 (ref 5.0–7.5)

## 2019-04-19 LAB — MICROSCOPIC EXAMINATION
Casts: NONE SEEN /lpf
RBC, Urine: >30 /hpf — AB (ref 0–2)

## 2019-04-19 NOTE — Progress Notes (Signed)
Internal Medicine Clinic Attending  Case discussed with Dr. Melvin  at the time of the visit.  We reviewed the resident's history and exam and pertinent patient test results.  I agree with the assessment, diagnosis, and plan of care documented in the resident's note.  

## 2019-04-22 ENCOUNTER — Ambulatory Visit: Payer: Medicare HMO

## 2019-04-22 ENCOUNTER — Telehealth: Payer: Self-pay | Admitting: Pharmacist

## 2019-04-22 NOTE — Telephone Encounter (Signed)
Patient had INR performed during visit with Dr. Trilby Drummer. Results 3.3 on 32.5mg  warfarin/wk. Patient was advised to DECREASE to 30mg  warfarin/wk. (1/2 x 5mg  M/Th; 5mg  all other days). RTC 27-May-2019 0900 for repeat INR.

## 2019-04-24 ENCOUNTER — Other Ambulatory Visit: Payer: Self-pay | Admitting: *Deleted

## 2019-04-24 DIAGNOSIS — R310 Gross hematuria: Secondary | ICD-10-CM | POA: Diagnosis not present

## 2019-04-24 DIAGNOSIS — F409 Phobic anxiety disorder, unspecified: Secondary | ICD-10-CM

## 2019-04-24 DIAGNOSIS — N201 Calculus of ureter: Secondary | ICD-10-CM | POA: Diagnosis not present

## 2019-04-24 DIAGNOSIS — F5105 Insomnia due to other mental disorder: Secondary | ICD-10-CM

## 2019-04-24 MED ORDER — ZOLPIDEM TARTRATE 5 MG PO TABS: 5.0000 mg | ORAL_TABLET | Freq: Every evening | ORAL | 5 refills | Status: DC | PRN

## 2019-04-30 DIAGNOSIS — R310 Gross hematuria: Secondary | ICD-10-CM | POA: Diagnosis not present

## 2019-04-30 DIAGNOSIS — N201 Calculus of ureter: Secondary | ICD-10-CM | POA: Diagnosis not present

## 2019-04-30 DIAGNOSIS — N132 Hydronephrosis with renal and ureteral calculous obstruction: Secondary | ICD-10-CM | POA: Diagnosis not present

## 2019-05-16 ENCOUNTER — Other Ambulatory Visit: Payer: Self-pay | Admitting: Pharmacist

## 2019-05-16 DIAGNOSIS — Z86711 Personal history of pulmonary embolism: Secondary | ICD-10-CM

## 2019-05-31 ENCOUNTER — Encounter: Payer: Self-pay | Admitting: Internal Medicine

## 2019-05-31 ENCOUNTER — Encounter: Payer: Self-pay | Admitting: Podiatry

## 2019-05-31 ENCOUNTER — Other Ambulatory Visit: Payer: Self-pay

## 2019-05-31 ENCOUNTER — Ambulatory Visit (INDEPENDENT_AMBULATORY_CARE_PROVIDER_SITE_OTHER): Payer: Medicare HMO | Admitting: Internal Medicine

## 2019-05-31 ENCOUNTER — Ambulatory Visit: Payer: Medicare HMO | Admitting: Podiatry

## 2019-05-31 VITALS — BP 138/81 | HR 76 | Temp 97.4°F | Ht 72.0 in | Wt 237.9 lb

## 2019-05-31 VITALS — BP 135/72

## 2019-05-31 DIAGNOSIS — L600 Ingrowing nail: Secondary | ICD-10-CM

## 2019-05-31 DIAGNOSIS — Z7901 Long term (current) use of anticoagulants: Secondary | ICD-10-CM

## 2019-05-31 DIAGNOSIS — M79675 Pain in left toe(s): Secondary | ICD-10-CM | POA: Diagnosis not present

## 2019-05-31 DIAGNOSIS — Z5181 Encounter for therapeutic drug level monitoring: Secondary | ICD-10-CM

## 2019-05-31 DIAGNOSIS — I69354 Hemiplegia and hemiparesis following cerebral infarction affecting left non-dominant side: Secondary | ICD-10-CM | POA: Diagnosis not present

## 2019-05-31 DIAGNOSIS — Z86718 Personal history of other venous thrombosis and embolism: Secondary | ICD-10-CM | POA: Diagnosis not present

## 2019-05-31 LAB — POCT INR: INR: 2.5 (ref 2.0–3.0)

## 2019-05-31 NOTE — Progress Notes (Signed)
CC: left big toe pain and redness  HPI:[name] is a 63 y.o. male who presents for evaluation of pain and redness of the left great toe. Please see individual problem based A/P for details.  Ingrown toenail of the left great toe: Patient presents today for evaluation of progressively worsening pain and swelling on the medial aspect of the left great toe just proximal and medial to the nail.  He stated that this began after he attempted to cut out an ingrown toenail advised by his friend.  There is mild edema, warmth, but no purulent drainage visible on exam today.  He denies systemic symptoms of fever, chills, nausea, vomiting, myalgias, or pain distal from the area.  Patient has good pulses: Dorsalis pedis and posterior tibial.  I do not see indication for antibiotics at this time but feel he needs definitive evaluation and treatment of the ingrown toenail.  I will refer him to podiatry to be seen hopefully today.  Plan: Ingrown toenail with referral to podiatry Patient given strict return precautions if he develops a fever or other systemic symptoms  History of venous thromboembolism: The patient request an INR today as he missed his most recent.  I believe this is appropriate and will obtain this and forwarded to Dr. Elie English for further evaluation management of his Coumadin regimen.  His POC INR was 2.5 today.  No medication changes recommended by me at this time.  Past Medical History:  Diagnosis Date  . Aortic atherosclerosis ([place]) 10/23/2017   Asymptomatic, seen on CT scan  . Bilateral cataracts 05/18/2016   Not visually significant  . Chronic constipation 06/12/2013  . Chronic low back pain 06/12/2013   L4-5 facet arthropathy   . Chronic pain of both shoulders 12/07/2013   A/C joint osteoarthritis   . Cluster headaches    Resolved in 2006  . COPD (chronic obstructive pulmonary disease) ([place])   . Dry eye syndrome, bilateral 05/18/2016  . Embolic stroke involving right  middle cerebral artery ([place]) 08/08/2004   Occured after traveling from [place] to [place] where he was hospitalized for 1 month with no further work-up or therapy.  Returned to [place] unable to walk and was admitted to [facility] immediately after landing. Presumed embolic per neuro but TEE, bubble study, and hypercoag W/U negative. On indefinite coumadin per patient's informed preference.  Residual effects : Left spastic hemiplegia. Tx with phenol tibi  . Generalized anxiety disorder 08/03/2018  . History of kidney stones   . Hyperplastic colon polyp 07/27/2012   Excised endoscopically 07/27/2012  . Hypertensive retinopathy of both eyes 05/18/2016  . Internal and external hemorrhoids without complication 0000000   Seen on colonoscopy 04/03/2007.  Marland Kitchen Left nephrolithiasis 08/31/2015   With mild left hydronephrosis.  Scheduled to undergo shockwave lithotripsy.  . Obesity (BMI 30.0-34.9) 12/07/2013  . Osteoarthritis of both knees 01/26/2012  . Positive PPD 12/07/2013  . Pulmonary embolism ([place]) 09/30/2004   Diagnosed in April 2006 after returning from [place].  Likely provoked as it occurred after a 12 hour plane flight within 2 months of R MCA CVA with left hemiparesis. Patient has made an informed decision to remain on lifelong warfarin.   . Tubular adenoma 12/19/2017   Endoscopically excised 04/03/2007.  Marland Kitchen Urge incontinence of urine 08/31/2015   Possibly secondary to prior CVA.  Treated with Myrbetriq.   Review of Systems:  ROS negative except as per HPI.  Physical Exam: Vitals:   05/31/19 1020  BP: 138/81  Pulse: 76  Temp: (!) 97.4 F (36.3 C)  TempSrc: Oral  SpO2: 100%  Weight: 237 lb 14.4 oz (107.9 kg)  Height: 6' (1.829 m)   General: A/O x4, in no acute distress, afebrile, nondiaphoretic HEENT: PEERL, EMO intact MSK: BLE nontender, nonedematous Skin: See A/P for ingrown toenail  Psych: Appropriate affect, not depressed in appearance, engages well  Assessment & Plan:   See Encounters Tab for problem  based charting.  Patient discussed with Dr. Angelia Mould

## 2019-05-31 NOTE — Patient Instructions (Signed)

## 2019-05-31 NOTE — Patient Instructions (Signed)
FOLLOW-UP INSTRUCTIONS When: As needed  Today we discussed the left great toe pain.  This looks to be an ingrown toenail with local infection.  Unfortunately, I am unable to remove this in the clinic today.  You would be best served by visiting the foot doctors.  Please let us know if you develop a fever, spreading of the infection to your foot, worsening pain, or other concerning symptoms while waiting to see the foot doctor.  Thank you for your visit to the [facility] today. If you have any questions or concerns please call us at [phone].

## 2019-06-01 ENCOUNTER — Encounter: Payer: Self-pay | Admitting: Podiatry

## 2019-06-01 NOTE — Progress Notes (Signed)
Subjective:  Patient ID: Alexander English, male    DOB: 28-Feb-1956,  MRN: [id number]  Chief Complaint  Patient presents with  . Ingrown Toenail    left big toenail of the lateral side, possible ingrown    63 y.o. male presents with the above complaint.  Patient presents with a left medial side ingrown nail.  It has been hurting him for a while.  Patient states that there is no clear clinical signs of infection.  It has been going on for a week.  It is elevated when applying pressure.  He denies any other acute complaints.   Review of Systems: Negative except as noted in the HPI. Denies N/V/F/Ch.  Past Medical History:  Diagnosis Date  . Aortic atherosclerosis ([place]) 10/23/2017   Asymptomatic, seen on CT scan  . Bilateral cataracts 05/18/2016   Not visually significant  . Chronic constipation 06/12/2013  . Chronic low back pain 06/12/2013   L4-5 facet arthropathy   . Chronic pain of both shoulders 12/07/2013   A/C joint osteoarthritis   . Cluster headaches    Resolved in 2006  . COPD (chronic obstructive pulmonary disease) ([place])   . Dry eye syndrome, bilateral 05/18/2016  . Embolic stroke involving right middle cerebral artery ([place]) 08/08/2004   Occured after traveling from [place] to [place] where he was hospitalized for 1 month with no further work-up or therapy.  Returned to [place] unable to walk and was admitted to [facility] immediately after landing. Presumed embolic per neuro but TEE, bubble study, and hypercoag W/U negative. On indefinite coumadin per patient's informed preference.  Residual effects : Left spastic hemiplegia. Tx with phenol tibi  . Generalized anxiety disorder 08/03/2018  . History of kidney stones   . Hyperplastic colon polyp 07/27/2012   Excised endoscopically 07/27/2012  . Hypertensive retinopathy of both eyes 05/18/2016  . Internal and external hemorrhoids without complication 0000000   Seen on colonoscopy 04/03/2007.  Marland Kitchen Left nephrolithiasis 08/31/2015   With mild left  hydronephrosis.  Scheduled to undergo shockwave lithotripsy.  . Obesity (BMI 30.0-34.9) 12/07/2013  . Osteoarthritis of both knees 01/26/2012  . Positive PPD 12/07/2013  . Pulmonary embolism ([place]) 09/30/2004   Diagnosed in April 2006 after returning from [place].  Likely provoked as it occurred after a 12 hour plane flight within 2 months of R MCA CVA with left hemiparesis. Patient has made an informed decision to remain on lifelong warfarin.   . Tubular adenoma 12/19/2017   Endoscopically excised 04/03/2007.  Marland Kitchen Urge incontinence of urine 08/31/2015   Possibly secondary to prior CVA.  Treated with Myrbetriq.    Current Outpatient Medications:  .  aspirin EC 81 MG tablet, Take 81 mg by mouth daily., Disp: , Rfl:  .  atorvastatin (LIPITOR) 10 MG tablet, Take 1 tablet (10 mg total) by mouth daily., Disp: 90 tablet, Rfl: 3 .  benzoyl peroxide 5 % external liquid, Apply topically 2 (two) times daily., Disp: 140 g, Rfl: 11 .  fesoterodine (TOVIAZ) 8 MG TB24 tablet, Take 1 tablet (8 mg total) by mouth daily., Disp: 90 tablet, Rfl: 3 .  guaiFENesin (ROBITUSSIN) 100 MG/5ML SOLN, Take 5 mLs (100 mg total) by mouth every 4 (four) hours as needed for cough or to loosen phlegm., Disp: 236 mL, Rfl: 0 .  Multiple Vitamin (MULTIVITAMIN) tablet, Take 1 tablet by mouth daily., Disp: , Rfl:  .  MYRBETRIQ 25 MG TB24 tablet, Take 25 mg by mouth daily., Disp: , Rfl:  .  oxyCODONE-acetaminophen (PERCOCET/ROXICET)  5-325 MG tablet, Take 1 tablet by mouth every 12 (twelve) hours as needed for severe pain., Disp: 60 tablet, Rfl: 0 .  promethazine (PHENERGAN) 25 MG suppository, Place 1 suppository (25 mg total) rectally every 6 (six) hours as needed for nausea or vomiting., Disp: 12 suppository, Rfl: 1 .  sertraline (ZOLOFT) 50 MG tablet, Take 1 tablet (50 mg total) by mouth daily., Disp: 90 tablet, Rfl: 3 .  sorbitol 70 % SOLN, Take 15-30 mLs by mouth daily as needed for moderate constipation., Disp: 473 mL, Rfl: 11 .   tamsulosin (FLOMAX) 0.4 MG CAPS capsule, , Disp: , Rfl:  .  warfarin (COUMADIN) 5 MG tablet, TAKE 1 TABLET BY MOUTH ONCE DAILY AT  6PM  -  EXCEPT  ON  MONDAYS AND THURSDAYS,  TAKE  ONLY  ONE-HALF  TABLET, Disp: 100 tablet, Rfl: 0 .  zolpidem (AMBIEN) 5 MG tablet, Take 1 tablet (5 mg total) by mouth at bedtime as needed for sleep., Disp: 30 tablet, Rfl: 5  Social History   Tobacco Use  Smoking Status Former Smoker  . Quit date: 06/28/2007  . Years since quitting: 11.9  Smokeless Tobacco Never Used    No Known Allergies Objective:   Vitals:   05/31/19 1207  BP: 135/72   There is no height or weight on file to calculate BMI. Constitutional Well developed. Well nourished.  Vascular Dorsalis pedis pulses palpable bilaterally. Posterior tibial pulses palpable bilaterally. Capillary refill normal to all digits.  No cyanosis or clubbing noted. Pedal hair growth normal.  Neurologic Normal speech. Oriented to person, place, and time. Epicritic sensation to light touch grossly present bilaterally.  Dermatologic Painful ingrowing nail at medial nail borders of the hallux nail left. No other open wounds. No skin lesions.  Orthopedic: Normal joint ROM without pain or crepitus bilaterally. No visible deformities. No bony tenderness.   Radiographs: None Assessment:   1. Ingrown nail of great toe of left foot   2. Great toe pain, left    Plan:  Patient was evaluated and treated and all questions answered.  Ingrown Nail, left -Patient elects to proceed with minor surgery to remove ingrown toenail removal today. Consent reviewed and signed by patient. -Ingrown nail excised. See procedure note. -Educated on post-procedure care including soaking. Written instructions provided and reviewed. -Patient to follow up in 2 weeks for nail check.  Procedure: Excision of Ingrown Toenail Location: Left 1st toe medial nail borders. Anesthesia: Lidocaine 1% plain; 1.5 mL and Marcaine 0.5% plain;  1.5 mL, digital block. Skin Prep: Betadine. Dressing: Silvadene; telfa; dry, sterile, compression dressing. Technique: Following skin prep, the toe was exsanguinated and a tourniquet was secured at the base of the toe. The affected nail border was freed, split with a nail splitter, and excised. Chemical matrixectomy was then performed with phenol and irrigated out with alcohol. The tourniquet was then removed and sterile dressing applied. Disposition: Patient tolerated procedure well. Patient to return in 2 weeks for follow-up.   Return in about 2 weeks (around 06/14/2019) for right nail check.

## 2019-06-03 ENCOUNTER — Telehealth: Payer: Self-pay | Admitting: Internal Medicine

## 2019-06-03 ENCOUNTER — Ambulatory Visit: Payer: Medicare HMO

## 2019-06-03 NOTE — Progress Notes (Signed)
Internal Medicine Clinic Attending  Case discussed with Dr. Harbrecht at the time of the visit.  We reviewed the resident's history and exam and pertinent patient test results.  I agree with the assessment, diagnosis, and plan of care documented in the resident's note.   

## 2019-06-04 DIAGNOSIS — N201 Calculus of ureter: Secondary | ICD-10-CM | POA: Diagnosis not present

## 2019-06-04 DIAGNOSIS — N5201 Erectile dysfunction due to arterial insufficiency: Secondary | ICD-10-CM | POA: Diagnosis not present

## 2019-06-04 DIAGNOSIS — R3915 Urgency of urination: Secondary | ICD-10-CM | POA: Diagnosis not present

## 2019-06-14 ENCOUNTER — Ambulatory Visit: Payer: Medicare HMO | Admitting: Podiatry

## 2019-06-14 ENCOUNTER — Other Ambulatory Visit: Payer: Self-pay

## 2019-06-14 ENCOUNTER — Encounter: Payer: Self-pay | Admitting: Podiatry

## 2019-06-14 DIAGNOSIS — L600 Ingrowing nail: Secondary | ICD-10-CM

## 2019-06-14 DIAGNOSIS — M79675 Pain in left toe(s): Secondary | ICD-10-CM

## 2019-06-14 NOTE — Progress Notes (Signed)
Subjective: Alexander English is a 63 y.o. male returns to office today for follow up evaluation after having left hallux medial border nail avulsion performed. Patient has been soaking using Epson salt and applying topical antibiotic covered with bandaid daily. Patient denies fevers, chills, nausea, vomiting. Denies any calf pain, chest pain, SOB.   Objective:  Vitals: Reviewed  General: Well developed, nourished, in no acute distress, alert and oriented x3   Dermatology: Skin is warm, dry and supple bilateral.  Left medial hallux nail border appears to be clean, dry, with mild granular tissue and surrounding scab. There is no surrounding erythema, edema, drainage/purulence. The remaining nails appear unremarkable at this time. There are no other lesions or other signs of infection present.  Neurovascular status: Intact. No lower extremity swelling; No pain with calf compression bilateral.  Musculoskeletal: Decreased tenderness to palpation of the left medial hallux nail fold(s). Muscular strength within normal limits bilateral.   Assesement and Plan: S/p partial nail avulsion, doing well.   -Continue soaking in epsom salts twice a day followed by antibiotic ointment and a band-aid. Can leave uncovered at night. Continue this until completely healed.  -If the area has not healed in 2 weeks, call the office for follow-up appointment, or sooner if any problems arise.  -Monitor for any signs/symptoms of infection. Call the office immediately if any occur or go directly to the emergency room. Call with any questions/concerns.  Boneta Lucks, DPM

## 2019-06-26 NOTE — Telephone Encounter (Signed)
Void  

## 2019-07-03 DIAGNOSIS — N401 Enlarged prostate with lower urinary tract symptoms: Secondary | ICD-10-CM | POA: Diagnosis not present

## 2019-07-03 DIAGNOSIS — N201 Calculus of ureter: Secondary | ICD-10-CM | POA: Diagnosis not present

## 2019-07-03 DIAGNOSIS — R3915 Urgency of urination: Secondary | ICD-10-CM | POA: Diagnosis not present

## 2019-07-08 ENCOUNTER — Ambulatory Visit (INDEPENDENT_AMBULATORY_CARE_PROVIDER_SITE_OTHER): Payer: Medicare HMO

## 2019-07-08 DIAGNOSIS — Z5181 Encounter for therapeutic drug level monitoring: Secondary | ICD-10-CM

## 2019-07-08 DIAGNOSIS — Z86718 Personal history of other venous thrombosis and embolism: Secondary | ICD-10-CM | POA: Diagnosis not present

## 2019-07-08 DIAGNOSIS — Z7901 Long term (current) use of anticoagulants: Secondary | ICD-10-CM

## 2019-07-08 LAB — POCT INR: INR: 1.8 — AB (ref 2.0–3.0)

## 2019-07-08 NOTE — Progress Notes (Signed)
Anticoagulation Management Alexander English is a 64 y.o. male who reports to the clinic for monitoring of warfarin treatment.    Indication: H/o thromboembolism and long term anticoagulation  Duration: indefinite Supervising physician: [facility] Visit History: Patient does not report signs/symptoms of bleeding or thromboembolism. The patient stated that before his last appointment he had slight hematuria that has been resolved after seeing a urologist.  Other recent changes: No changes in diet, medications, lifestyle Anticoagulation Episode Summary    Current INR goal:  2.0-3.0  TTR:  82.6 % (7.3 y)  Next INR check:  08/19/2019  INR from last check:  1.8 (07/08/2019)  Weekly max warfarin dose:    Target end date:  Indefinite  INR check location:  Anticoagulation Clinic  Preferred lab:    Send INR reminders to:  [facility]   Indications   History of venous thromboembolism [Z86.718]       Comments:          No Known Allergies  Current Outpatient Medications:  .  aspirin EC 81 MG tablet, Take 81 mg by mouth daily., Disp: , Rfl:  .  atorvastatin (LIPITOR) 10 MG tablet, Take 1 tablet (10 mg total) by mouth daily., Disp: 90 tablet, Rfl: 3 .  benzoyl peroxide 5 % external liquid, Apply topically 2 (two) times daily., Disp: 140 g, Rfl: 11 .  fesoterodine (TOVIAZ) 8 MG TB24 tablet, Take 1 tablet (8 mg total) by mouth daily., Disp: 90 tablet, Rfl: 3 .  guaiFENesin (ROBITUSSIN) 100 MG/5ML SOLN, Take 5 mLs (100 mg total) by mouth every 4 (four) hours as needed for cough or to loosen phlegm., Disp: 236 mL, Rfl: 0 .  Multiple Vitamin (MULTIVITAMIN) tablet, Take 1 tablet by mouth daily., Disp: , Rfl:  .  MYRBETRIQ 25 MG TB24 tablet, Take 25 mg by mouth daily., Disp: , Rfl:  .  oxyCODONE-acetaminophen (PERCOCET/ROXICET) 5-325 MG tablet, Take 1 tablet by mouth every 12 (twelve) hours as needed for severe pain., Disp: 60 tablet, Rfl: 0 .  promethazine (PHENERGAN)  25 MG suppository, Place 1 suppository (25 mg total) rectally every 6 (six) hours as needed for nausea or vomiting., Disp: 12 suppository, Rfl: 1 .  sertraline (ZOLOFT) 50 MG tablet, Take 1 tablet (50 mg total) by mouth daily., Disp: 90 tablet, Rfl: 3 .  sorbitol 70 % SOLN, Take 15-30 mLs by mouth daily as needed for moderate constipation., Disp: 473 mL, Rfl: 11 .  tamsulosin (FLOMAX) 0.4 MG CAPS capsule, , Disp: , Rfl:  .  warfarin (COUMADIN) 5 MG tablet, TAKE 1 TABLET BY MOUTH ONCE DAILY AT  6PM  -  EXCEPT  ON  MONDAYS AND THURSDAYS,  TAKE  ONLY  ONE-HALF  TABLET, Disp: 100 tablet, Rfl: 0 .  zolpidem (AMBIEN) 5 MG tablet, Take 1 tablet (5 mg total) by mouth at bedtime as needed for sleep., Disp: 30 tablet, Rfl: 5 Past Medical History:  Diagnosis Date  . Aortic atherosclerosis ([place]) 10/23/2017   Asymptomatic, seen on CT scan  . Bilateral cataracts 05/18/2016   Not visually significant  . Chronic constipation 06/12/2013  . Chronic low back pain 06/12/2013   L4-5 facet arthropathy   . Chronic pain of both shoulders 12/07/2013   A/C joint osteoarthritis   . Cluster headaches    Resolved in 2006  . COPD (chronic obstructive pulmonary disease) ([place])   . Dry eye syndrome, bilateral 05/18/2016  . Embolic stroke involving right middle cerebral artery ([place]) 08/08/2004  Occured after traveling from [place] to [place] where he was hospitalized for 1 month with no further work-up or therapy.  Returned to [place] unable to walk and was admitted to [facility] immediately after landing. Presumed embolic per neuro but TEE, bubble study, and hypercoag W/U negative. On indefinite coumadin per patient's informed preference.  Residual effects : Left spastic hemiplegia. Tx with phenol tibi  . Generalized anxiety disorder 08/03/2018  . History of kidney stones   . Hyperplastic colon polyp 07/27/2012   Excised endoscopically 07/27/2012  . Hypertensive retinopathy of both eyes 05/18/2016  . Internal and external hemorrhoids without  complication 0000000   Seen on colonoscopy 04/03/2007.  Marland Kitchen Left nephrolithiasis 08/31/2015   With mild left hydronephrosis.  Scheduled to undergo shockwave lithotripsy.  . Obesity (BMI 30.0-34.9) 12/07/2013  . Osteoarthritis of both knees 01/26/2012  . Positive PPD 12/07/2013  . Pulmonary embolism ([place]) 09/30/2004   Diagnosed in April 2006 after returning from [place].  Likely provoked as it occurred after a 12 hour plane flight within 2 months of R MCA CVA with left hemiparesis. Patient has made an informed decision to remain on lifelong warfarin.   . Tubular adenoma 12/19/2017   Endoscopically excised 04/03/2007.  Marland Kitchen Urge incontinence of urine 08/31/2015   Possibly secondary to prior CVA.  Treated with Myrbetriq.   Social History   Socioeconomic History  . Marital status: Divorced    Spouse name: Not on file  . Number of children: Not on file  . Years of education: Not on file  . Highest education level: Not on file  Occupational History  . Not on file  Tobacco Use  . Smoking status: Former Smoker    Quit date: 06/28/2007    Years since quitting: 12.0  . Smokeless tobacco: Never Used  Substance and Sexual Activity  . Alcohol use: No    Alcohol/week: 0.0 standard drinks    Comment: Remote past  . Drug use: No  . Sexual activity: Not on file  Other Topics Concern  . Not on file  Social History Narrative   Born in [place] but has been in the [place] for > 30 years.  Divorced, 1 daughter and 2 sons.  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   Social Determinants of Health   Financial Resource Strain:   . Difficulty of Paying Living Expenses: Not on file  Food Insecurity:   . Worried About Charity fundraiser in the Last Year: Not on file  . Ran Out of Food in the Last Year: Not on file  Transportation Needs:   . Lack of Transportation (Medical): Not on file  . Lack of Transportation (Non-Medical): Not on file  Physical Activity: Inactive  .  Days of Exercise per Week: 0 days  . Minutes of Exercise per Session: 0 min  Stress: No Stress Concern Present  . Feeling of Stress : Only a little  Social Connections: Somewhat Isolated  . Frequency of Communication with Friends and Family: More than three times a week  . Frequency of Social Gatherings with Friends and Family: Three times a week  . Attends Religious Services: 1 to 4 times per year  . Active Member of Clubs or Organizations: No  . Attends Archivist Meetings: Never  . Marital Status: Divorced   Family History  Problem Relation Age of Onset  . Stroke Maternal Grandmother   . Unexplained death Mother   . Depression Mother  After husband's death  . Unexplained death Father   . Osteoarthritis Father        Knees  . Early death Sister   . Seizures Sister   . Early death Brother 7       Unknown cause  . Healthy Daughter   . Healthy Son   . Stroke Maternal Aunt   . Healthy Sister   . Healthy Sister   . Unexplained death Brother   . Healthy Brother   . Healthy Son     ASSESSMENT Recent Results: The most recent result is correlated with 30 mg per week: Lab Results  Component Value Date   INR 1.8 (A) 07/08/2019   INR 2.5 05/31/2019   INR 3.3 (A) 04/18/2019    Anticoagulation Dosing: Description   Take one (1) tablet of your 5mg  peach-colored warfarin tablets by mouth, once-daily except on Thursdays take 1/2 tablet at 6PM.         INR today: Subtherapeutic  PLAN Weekly dose was increased by 7.7% to 32.5 mg per week  Patient Instructions  Patient instructed to take medications as defined in the Anti-coagulation Track section of this encounter.  Patient instructed to take today's dose.  Patient instructed to take one (1) tablet of your 5mg  peach-colored warfarin tablets by mouth, once-daily except on Thursdays take 1/2 tablet at [facility].   Patient verbalized understanding of these instructions.    Patient advised to contact clinic or  seek medical attention if signs/symptoms of bleeding or thromboembolism occur.  Patient verbalized understanding by repeating back information and was advised to contact me if further medication-related questions arise. Patient was also provided an information handout.  Follow-up Return in 6 weeks (on 08/19/2019), or INR check on 2/22 at 11 am.  Agnes Lawrence, PharmD PGY1 Pharmacy Resident    15 minutes spent face-to-face with the patient during the encounter. 50% of time spent on education, including signs/sx bleeding and clotting, as well as food and drug interactions with warfarin. 50% of time was spent on fingerprick POC INR sample collection,processing, results determination, and documentation in http://www.kim.net/.

## 2019-07-08 NOTE — Patient Instructions (Signed)
Patient instructed to take medications as defined in the Anti-coagulation Track section of this encounter.  Patient instructed to take today's dose.  Patient instructed to take one (1) tablet of your 5mg  peach-colored warfarin tablets by mouth, once-daily except on Thursdays take 1/2 tablet at [facility].   Patient verbalized understanding of these instructions.

## 2019-07-12 NOTE — Progress Notes (Signed)
I reviewed the anticoagulation note.  Patient is on [facility] for VTE and INR low.  Dose increased.

## 2019-08-19 ENCOUNTER — Ambulatory Visit: Payer: Medicare HMO

## 2019-08-21 ENCOUNTER — Other Ambulatory Visit: Payer: Self-pay | Admitting: Pharmacist

## 2019-08-21 DIAGNOSIS — Z86711 Personal history of pulmonary embolism: Secondary | ICD-10-CM

## 2019-08-26 ENCOUNTER — Other Ambulatory Visit: Payer: Self-pay

## 2019-08-26 ENCOUNTER — Ambulatory Visit (INDEPENDENT_AMBULATORY_CARE_PROVIDER_SITE_OTHER): Payer: Medicare HMO

## 2019-08-26 DIAGNOSIS — Z5181 Encounter for therapeutic drug level monitoring: Secondary | ICD-10-CM

## 2019-08-26 DIAGNOSIS — Z86718 Personal history of other venous thrombosis and embolism: Secondary | ICD-10-CM | POA: Diagnosis not present

## 2019-08-26 DIAGNOSIS — F411 Generalized anxiety disorder: Secondary | ICD-10-CM

## 2019-08-26 DIAGNOSIS — Z7901 Long term (current) use of anticoagulants: Secondary | ICD-10-CM

## 2019-08-26 DIAGNOSIS — E785 Hyperlipidemia, unspecified: Secondary | ICD-10-CM

## 2019-08-26 LAB — POCT INR: INR: 2.8 (ref 2.0–3.0)

## 2019-08-26 NOTE — Patient Instructions (Signed)
Patient instructed to take medications as defined in the Anti-coagulation Track section of this encounter.  Patient instructed to take today's dose.  Patient instructed to take one (1) tablet of your 5mg  peach-colored warfarin tablets by mouth, once-daily except on Thursdays take 1/2 tablet at [facility].   Patient verbalized understanding of these instructions.

## 2019-08-26 NOTE — Progress Notes (Signed)
Anticoagulation Management Alexander English is a 64 y.o. male who reports to the clinic for monitoring of warfarin treatment.    Indication: History of VTE   Duration: indefinite Supervising physician: [facility] Visit History: Patient does not report signs/symptoms of bleeding or thromboembolism  Other recent changes: no diet, medications, lifestyle changes.  Anticoagulation Episode Summary    Current INR goal:  2.0-3.0  TTR:  82.6 % (7.4 y)  Next INR check:  09/23/2019  INR from last check:  2.8 (08/26/2019)  Weekly max warfarin dose:    Target end date:  Indefinite  INR check location:  Anticoagulation Clinic  Preferred lab:    Send INR reminders to:  [facility]   Indications   History of venous thromboembolism [Z86.718]       Comments:          No Known Allergies  Current Outpatient Medications:  .  aspirin EC 81 MG tablet, Take 81 mg by mouth daily., Disp: , Rfl:  .  atorvastatin (LIPITOR) 10 MG tablet, Take 1 tablet (10 mg total) by mouth daily., Disp: 90 tablet, Rfl: 3 .  benzoyl peroxide 5 % external liquid, Apply topically 2 (two) times daily., Disp: 140 g, Rfl: 11 .  fesoterodine (TOVIAZ) 8 MG TB24 tablet, Take 1 tablet (8 mg total) by mouth daily., Disp: 90 tablet, Rfl: 3 .  guaiFENesin (ROBITUSSIN) 100 MG/5ML SOLN, Take 5 mLs (100 mg total) by mouth every 4 (four) hours as needed for cough or to loosen phlegm., Disp: 236 mL, Rfl: 0 .  Multiple Vitamin (MULTIVITAMIN) tablet, Take 1 tablet by mouth daily., Disp: , Rfl:  .  MYRBETRIQ 25 MG TB24 tablet, Take 25 mg by mouth daily., Disp: , Rfl:  .  oxyCODONE-acetaminophen (PERCOCET/ROXICET) 5-325 MG tablet, Take 1 tablet by mouth every 12 (twelve) hours as needed for severe pain., Disp: 60 tablet, Rfl: 0 .  promethazine (PHENERGAN) 25 MG suppository, Place 1 suppository (25 mg total) rectally every 6 (six) hours as needed for nausea or vomiting., Disp: 12 suppository, Rfl: 1 .   sertraline (ZOLOFT) 50 MG tablet, Take 1 tablet (50 mg total) by mouth daily., Disp: 90 tablet, Rfl: 3 .  sorbitol 70 % SOLN, Take 15-30 mLs by mouth daily as needed for moderate constipation., Disp: 473 mL, Rfl: 11 .  tamsulosin (FLOMAX) 0.4 MG CAPS capsule, , Disp: , Rfl:  .  warfarin (COUMADIN) 5 MG tablet, TAKE 1 TABLET BY MOUTH ONCE DAILY 6 IN THE EVENING -  EXCEPT  ON  MONDAY  AND  THURSDAY,  TAKE  ONE-HALF  TABLET, Disp: 100 tablet, Rfl: 0 .  zolpidem (AMBIEN) 5 MG tablet, Take 1 tablet (5 mg total) by mouth at bedtime as needed for sleep., Disp: 30 tablet, Rfl: 5 Past Medical History:  Diagnosis Date  . Aortic atherosclerosis ([place]) 10/23/2017   Asymptomatic, seen on CT scan  . Bilateral cataracts 05/18/2016   Not visually significant  . Chronic constipation 06/12/2013  . Chronic low back pain 06/12/2013   L4-5 facet arthropathy   . Chronic pain of both shoulders 12/07/2013   A/C joint osteoarthritis   . Cluster headaches    Resolved in 2006  . COPD (chronic obstructive pulmonary disease) ([place])   . Dry eye syndrome, bilateral 05/18/2016  . Embolic stroke involving right middle cerebral artery ([place]) 08/08/2004   Occured after traveling from [place] to [place] where he was hospitalized for 1 month with no further work-up or therapy.  Returned to [place] unable to walk and was admitted to [facility] immediately after landing. Presumed embolic per neuro but TEE, bubble study, and hypercoag W/U negative. On indefinite coumadin per patient's informed preference.  Residual effects : Left spastic hemiplegia. Tx with phenol tibi  . Generalized anxiety disorder 08/03/2018  . History of kidney stones   . Hyperplastic colon polyp 07/27/2012   Excised endoscopically 07/27/2012  . Hypertensive retinopathy of both eyes 05/18/2016  . Internal and external hemorrhoids without complication 0000000   Seen on colonoscopy 04/03/2007.  Marland Kitchen Left nephrolithiasis 08/31/2015   With mild left hydronephrosis.  Scheduled to undergo  shockwave lithotripsy.  . Obesity (BMI 30.0-34.9) 12/07/2013  . Osteoarthritis of both knees 01/26/2012  . Positive PPD 12/07/2013  . Pulmonary embolism ([place]) 09/30/2004   Diagnosed in April 2006 after returning from [place].  Likely provoked as it occurred after a 12 hour plane flight within 2 months of R MCA CVA with left hemiparesis. Patient has made an informed decision to remain on lifelong warfarin.   . Tubular adenoma 12/19/2017   Endoscopically excised 04/03/2007.  Marland Kitchen Urge incontinence of urine 08/31/2015   Possibly secondary to prior CVA.  Treated with Myrbetriq.   Social History   Socioeconomic History  . Marital status: Divorced    Spouse name: Not on file  . Number of children: Not on file  . Years of education: Not on file  . Highest education level: Not on file  Occupational History  . Not on file  Tobacco Use  . Smoking status: Former Smoker    Quit date: 06/28/2007    Years since quitting: 12.1  . Smokeless tobacco: Never Used  Substance and Sexual Activity  . Alcohol use: No    Alcohol/week: 0.0 standard drinks    Comment: Remote past  . Drug use: No  . Sexual activity: Not on file  Other Topics Concern  . Not on file  Social History Narrative   Born in [place] but has been in the [place] for > 30 years.  Divorced, 1 daughter and 2 sons.  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   Social Determinants of Health   Financial Resource Strain:   . Difficulty of Paying Living Expenses: Not on file  Food Insecurity:   . Worried About Charity fundraiser in the Last Year: Not on file  . Ran Out of Food in the Last Year: Not on file  Transportation Needs:   . Lack of Transportation (Medical): Not on file  . Lack of Transportation (Non-Medical): Not on file  Physical Activity: Inactive  . Days of Exercise per Week: 0 days  . Minutes of Exercise per Session: 0 min  Stress: No Stress Concern Present  . Feeling of Stress : Only a  little  Social Connections: Somewhat Isolated  . Frequency of Communication with Friends and Family: More than three times a week  . Frequency of Social Gatherings with Friends and Family: Three times a week  . Attends Religious Services: 1 to 4 times per year  . Active Member of Clubs or Organizations: No  . Attends Archivist Meetings: Never  . Marital Status: Divorced   Family History  Problem Relation Age of Onset  . Stroke Maternal Grandmother   . Unexplained death Mother   . Depression Mother        After husband's death  . Unexplained death Father   . Osteoarthritis Father  Knees  . Early death Sister   . Seizures Sister   . Early death Brother 38       Unknown cause  . Healthy Daughter   . Healthy Son   . Stroke Maternal Aunt   . Healthy Sister   . Healthy Sister   . Unexplained death Brother   . Healthy Brother   . Healthy Son     ASSESSMENT Recent Results: The most recent result is correlated with 32.5 mg per week; however, patient endorsed taking a full tablet last week on Thursday (08/22/2019) instead of a half tablet as instructed. Patient had eaten a lot of greens and was worried his INR was low.   Lab Results  Component Value Date   INR 2.8 08/26/2019   INR 1.8 (A) 07/08/2019   INR 2.5 05/31/2019    Anticoagulation Dosing: Description   Take one (1) tablet of your 5mg  peach-colored warfarin tablets by mouth, once-daily except on Thursdays take 1/2 tablet at 6PM.         INR today: Therapeutic  PLAN Weekly dose was unchanged   Patient Instructions  Patient instructed to take medications as defined in the Anti-coagulation Track section of this encounter.  Patient instructed to take today's dose.  Patient instructed to take one (1) tablet of your 5mg  peach-colored warfarin tablets by mouth, once-daily except on Thursdays take 1/2 tablet at [facility]   Patient verbalized understanding of these instructions.     Patient advised to  contact clinic or seek medical attention if signs/symptoms of bleeding or thromboembolism occur.  Patient verbalized understanding by repeating back information and was advised to contact me if further medication-related questions arise. Patient was also provided an information handout.  Follow-up Return in 4 weeks (on 09/23/2019), or Follow-up INR.  Acey Lav, PharmD  PGY1 Acute Care Pharmacy Resident  15 minutes spent face-to-face with the patient during the encounter. 50% of time spent on education, including signs/sx bleeding and clotting, as well as food and drug interactions with warfarin. 50% of time was spent on fingerprick POC INR sample collection,processing, results determination, and documentation in http://www.kim.net/.

## 2019-08-27 DIAGNOSIS — H04123 Dry eye syndrome of bilateral lacrimal glands: Secondary | ICD-10-CM | POA: Diagnosis not present

## 2019-08-27 DIAGNOSIS — H40023 Open angle with borderline findings, high risk, bilateral: Secondary | ICD-10-CM | POA: Diagnosis not present

## 2019-08-27 MED ORDER — SERTRALINE HCL 50 MG PO TABS: 50.0000 mg | ORAL_TABLET | Freq: Every day | ORAL | 3 refills | Status: DC

## 2019-08-27 MED ORDER — ATORVASTATIN CALCIUM 10 MG PO TABS: 10.0000 mg | ORAL_TABLET | Freq: Every day | ORAL | 3 refills | Status: DC

## 2019-08-27 NOTE — Telephone Encounter (Signed)
Needs refill on atorvastatin (LIPITOR) 10 MG tablet sertraline (ZOLOFT) 50 MG tablet   ;pt contact [place] ([facility]), [place] [facility] [place]

## 2019-09-09 DIAGNOSIS — C310 Malignant neoplasm of maxillary sinus: Secondary | ICD-10-CM | POA: Diagnosis not present

## 2019-09-23 ENCOUNTER — Ambulatory Visit (INDEPENDENT_AMBULATORY_CARE_PROVIDER_SITE_OTHER): Payer: Medicare HMO | Admitting: Pharmacist

## 2019-09-23 DIAGNOSIS — Z5181 Encounter for therapeutic drug level monitoring: Secondary | ICD-10-CM

## 2019-09-23 DIAGNOSIS — Z86718 Personal history of other venous thrombosis and embolism: Secondary | ICD-10-CM | POA: Diagnosis not present

## 2019-09-23 DIAGNOSIS — Z7901 Long term (current) use of anticoagulants: Secondary | ICD-10-CM

## 2019-09-23 LAB — POCT INR: INR: 3 (ref 2.0–3.0)

## 2019-09-23 NOTE — Progress Notes (Signed)
Anticoagulation Management Alexander English is a 64 y.o. male who reports to the clinic for monitoring of warfarin treatment.    Indication: DVT, History of (resolved); Long term current use of anticoagulant warfarin.  Duration: indefinite Supervising physician: Joni Reining  Anticoagulation Clinic Visit History: Patient does not report signs/symptoms of bleeding or thromboembolism  Other recent changes: No diet, medications, lifestyle changes endorsed at this visit.  Anticoagulation Episode Summary    Current INR goal:  2.0-3.0  TTR:  82.7 % (7.5 y)  Next INR check:  11/04/2019  INR from last check:  3.0 (09/23/2019)  Weekly max warfarin dose:    Target end date:  Indefinite  INR check location:  Anticoagulation Clinic  Preferred lab:    Send INR reminders to:  [facility]   Indications   History of venous thromboembolism [Z86.718]       Comments:          No Known Allergies  Current Outpatient Medications:  .  aspirin EC 81 MG tablet, Take 81 mg by mouth daily., Disp: , Rfl:  .  atorvastatin (LIPITOR) 10 MG tablet, Take 1 tablet (10 mg total) by mouth daily., Disp: 90 tablet, Rfl: 3 .  benzoyl peroxide 5 % external liquid, Apply topically 2 (two) times daily., Disp: 140 g, Rfl: 11 .  fesoterodine (TOVIAZ) 8 MG TB24 tablet, Take 1 tablet (8 mg total) by mouth daily., Disp: 90 tablet, Rfl: 3 .  guaiFENesin (ROBITUSSIN) 100 MG/5ML SOLN, Take 5 mLs (100 mg total) by mouth every 4 (four) hours as needed for cough or to loosen phlegm., Disp: 236 mL, Rfl: 0 .  Multiple Vitamin (MULTIVITAMIN) tablet, Take 1 tablet by mouth daily., Disp: , Rfl:  .  MYRBETRIQ 25 MG TB24 tablet, Take 25 mg by mouth daily., Disp: , Rfl:  .  oxyCODONE-acetaminophen (PERCOCET/ROXICET) 5-325 MG tablet, Take 1 tablet by mouth every 12 (twelve) hours as needed for severe pain., Disp: 60 tablet, Rfl: 0 .  promethazine (PHENERGAN) 25 MG suppository, Place 1 suppository (25 mg total) rectally every 6 (six)  hours as needed for nausea or vomiting., Disp: 12 suppository, Rfl: 1 .  sertraline (ZOLOFT) 50 MG tablet, Take 1 tablet (50 mg total) by mouth daily., Disp: 90 tablet, Rfl: 3 .  sorbitol 70 % SOLN, Take 15-30 mLs by mouth daily as needed for moderate constipation., Disp: 473 mL, Rfl: 11 .  tamsulosin (FLOMAX) 0.4 MG CAPS capsule, , Disp: , Rfl:  .  warfarin (COUMADIN) 5 MG tablet, TAKE 1 TABLET BY MOUTH ONCE DAILY 6 IN THE EVENING -  EXCEPT  ON  MONDAY  AND  THURSDAY,  TAKE  ONE-HALF  TABLET, Disp: 100 tablet, Rfl: 0 .  zolpidem (AMBIEN) 5 MG tablet, Take 1 tablet (5 mg total) by mouth at bedtime as needed for sleep., Disp: 30 tablet, Rfl: 5 Past Medical History:  Diagnosis Date  . Aortic atherosclerosis ([place]) 10/23/2017   Asymptomatic, seen on CT scan  . Bilateral cataracts 05/18/2016   Not visually significant  . Chronic constipation 06/12/2013  . Chronic low back pain 06/12/2013   L4-5 facet arthropathy   . Chronic pain of both shoulders 12/07/2013   A/C joint osteoarthritis   . Cluster headaches    Resolved in 2006  . COPD (chronic obstructive pulmonary disease) ([place])   . Dry eye syndrome, bilateral 05/18/2016  . Embolic stroke involving right middle cerebral artery ([place]) 08/08/2004   Occured after traveling from [place] to [place] where he  was hospitalized for 1 month with no further work-up or therapy.  Returned to [place] unable to walk and was admitted to [facility] immediately after landing. Presumed embolic per neuro but TEE, bubble study, and hypercoag W/U negative. On indefinite coumadin per patient's informed preference.  Residual effects : Left spastic hemiplegia. Tx with phenol tibi  . Generalized anxiety disorder 08/03/2018  . History of kidney stones   . Hyperplastic colon polyp 07/27/2012   Excised endoscopically 07/27/2012  . Hypertensive retinopathy of both eyes 05/18/2016  . Internal and external hemorrhoids without complication 0000000   Seen on colonoscopy 04/03/2007.  Marland Kitchen Left  nephrolithiasis 08/31/2015   With mild left hydronephrosis.  Scheduled to undergo shockwave lithotripsy.  . Obesity (BMI 30.0-34.9) 12/07/2013  . Osteoarthritis of both knees 01/26/2012  . Positive PPD 12/07/2013  . Pulmonary embolism ([place]) 09/30/2004   Diagnosed in April 2006 after returning from [place].  Likely provoked as it occurred after a 12 hour plane flight within 2 months of R MCA CVA with left hemiparesis. Patient has made an informed decision to remain on lifelong warfarin.   . Tubular adenoma 12/19/2017   Endoscopically excised 04/03/2007.  Marland Kitchen Urge incontinence of urine 08/31/2015   Possibly secondary to prior CVA.  Treated with Myrbetriq.   Social History   Socioeconomic History  . Marital status: Divorced    Spouse name: Not on file  . Number of children: Not on file  . Years of education: Not on file  . Highest education level: Not on file  Occupational History  . Not on file  Tobacco Use  . Smoking status: Former Smoker    Quit date: 06/28/2007    Years since quitting: 12.2  . Smokeless tobacco: Never Used  Substance and Sexual Activity  . Alcohol use: No    Alcohol/week: 0.0 standard drinks    Comment: Remote past  . Drug use: No  . Sexual activity: Not on file  Other Topics Concern  . Not on file  Social History Narrative   Born in [place] but has been in the [place] for > 30 years.  Divorced, 1 daughter and 2 sons.  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   Social Determinants of Health   Financial Resource Strain:   . Difficulty of Paying Living Expenses:   Food Insecurity:   . Worried About Charity fundraiser in the Last Year:   . Arboriculturist in the Last Year:   Transportation Needs:   . Film/video editor (Medical):   Marland Kitchen Lack of Transportation (Non-Medical):   Physical Activity: Inactive  . Days of Exercise per Week: 0 days  . Minutes of Exercise per Session: 0 min  Stress: No Stress Concern Present  .  Feeling of Stress : Only a little  Social Connections: Somewhat Isolated  . Frequency of Communication with Friends and Family: More than three times a week  . Frequency of Social Gatherings with Friends and Family: Three times a week  . Attends Religious Services: 1 to 4 times per year  . Active Member of Clubs or Organizations: No  . Attends Archivist Meetings: Never  . Marital Status: Divorced   Family History  Problem Relation Age of Onset  . Stroke Maternal Grandmother   . Unexplained death Mother   . Depression Mother        After husband's death  . Unexplained death Father   . Osteoarthritis  Father        Knees  . Early death Sister   . Seizures Sister   . Early death Brother 53       Unknown cause  . Healthy Daughter   . Healthy Son   . Stroke Maternal Aunt   . Healthy Sister   . Healthy Sister   . Unexplained death Brother   . Healthy Brother   . Healthy Son     ASSESSMENT Recent Results: The most recent result is correlated with 32.5 mg per week: Lab Results  Component Value Date   INR 3.0 09/23/2019   INR 2.8 08/26/2019   INR 1.8 (A) 07/08/2019    Anticoagulation Dosing: Description   Take one (1) tablet of your 5mg  peach-colored warfarin tablets by mouth, once-daily except on Mondays and Thursdays take 1/2 tablet at 6PM.         INR today: Therapeutic  PLAN Weekly dose was decreased by 8% to 30 mg per week  Patient Instructions  Patient instructed to take medications as defined in the Anti-coagulation Track section of this encounter.  Patient instructed to take today's dose.  Patient instructed to take one (1) tablet of your 5mg  peach-colored warfarin tablets by mouth, once-daily except on Mondays and Thursdays take 1/2 tablet at [facility] Patient verbalized understanding of these instructions.    Patient advised to contact clinic or seek medical attention if signs/symptoms of bleeding or thromboembolism occur.  Patient verbalized  understanding by repeating back information and was advised to contact me if further medication-related questions arise. Patient was also provided an information handout.  Follow-up Return in 6 weeks (on 11/04/2019) for Follow up INR.  [name], PharmD, CPP  15 minutes spent face-to-face with the patient during the encounter. 50% of time spent on education, including signs/sx bleeding and clotting, as well as food and drug interactions with warfarin. 50% of time was spent on fingerprick POC INR sample collection,processing, results determination, and documentation in http://www.[email or web address]/.

## 2019-09-23 NOTE — Patient Instructions (Signed)
Patient instructed to take medications as defined in the Anti-coagulation Track section of this encounter.  Patient instructed to take today's dose.  Patient instructed to take one (1) tablet of your 5mg peach-colored warfarin tablets by mouth, once-daily except on Mondays and Thursdays take 1/2 tablet at 6PM.   Patient verbalized understanding of these instructions.   

## 2019-09-23 NOTE — Progress Notes (Signed)
INTERNAL MEDICINE TEACHING ATTENDING ADDENDUM   I agree with pharmacy recommendations as outlined in their note.   Promiss Labarbera, MD  

## 2019-10-17 ENCOUNTER — Other Ambulatory Visit: Payer: Self-pay

## 2019-10-17 ENCOUNTER — Encounter: Payer: Self-pay | Admitting: Internal Medicine

## 2019-10-17 ENCOUNTER — Ambulatory Visit (INDEPENDENT_AMBULATORY_CARE_PROVIDER_SITE_OTHER): Payer: Medicare HMO | Admitting: Internal Medicine

## 2019-10-17 VITALS — BP 135/69 | HR 76 | Temp 98.2°F | Ht 72.0 in | Wt 244.5 lb

## 2019-10-17 DIAGNOSIS — R7309 Other abnormal glucose: Secondary | ICD-10-CM | POA: Diagnosis not present

## 2019-10-17 DIAGNOSIS — Z23 Encounter for immunization: Secondary | ICD-10-CM

## 2019-10-17 DIAGNOSIS — I700 Atherosclerosis of aorta: Secondary | ICD-10-CM | POA: Diagnosis not present

## 2019-10-17 DIAGNOSIS — E669 Obesity, unspecified: Secondary | ICD-10-CM

## 2019-10-17 DIAGNOSIS — Z79891 Long term (current) use of opiate analgesic: Secondary | ICD-10-CM

## 2019-10-17 DIAGNOSIS — Z6833 Body mass index (BMI) 33.0-33.9, adult: Secondary | ICD-10-CM

## 2019-10-17 DIAGNOSIS — M25462 Effusion, left knee: Secondary | ICD-10-CM

## 2019-10-17 DIAGNOSIS — Z87891 Personal history of nicotine dependence: Secondary | ICD-10-CM

## 2019-10-17 DIAGNOSIS — G8114 Spastic hemiplegia affecting left nondominant side: Secondary | ICD-10-CM | POA: Diagnosis not present

## 2019-10-17 DIAGNOSIS — E785 Hyperlipidemia, unspecified: Secondary | ICD-10-CM | POA: Diagnosis not present

## 2019-10-17 DIAGNOSIS — G8110 Spastic hemiplegia affecting unspecified side: Secondary | ICD-10-CM

## 2019-10-17 DIAGNOSIS — M160 Bilateral primary osteoarthritis of hip: Secondary | ICD-10-CM

## 2019-10-17 DIAGNOSIS — M170 Bilateral primary osteoarthritis of knee: Secondary | ICD-10-CM

## 2019-10-17 DIAGNOSIS — N200 Calculus of kidney: Secondary | ICD-10-CM

## 2019-10-17 DIAGNOSIS — R310 Gross hematuria: Secondary | ICD-10-CM

## 2019-10-17 DIAGNOSIS — M172 Bilateral post-traumatic osteoarthritis of knee: Secondary | ICD-10-CM

## 2019-10-17 DIAGNOSIS — D7219 Other eosinophilia: Secondary | ICD-10-CM | POA: Diagnosis not present

## 2019-10-17 DIAGNOSIS — Z79899 Other long term (current) drug therapy: Secondary | ICD-10-CM

## 2019-10-17 DIAGNOSIS — M21372 Foot drop, left foot: Secondary | ICD-10-CM

## 2019-10-17 DIAGNOSIS — I69354 Hemiplegia and hemiparesis following cerebral infarction affecting left non-dominant side: Secondary | ICD-10-CM

## 2019-10-17 DIAGNOSIS — Z0000 Encounter for general adult medical examination without abnormal findings: Secondary | ICD-10-CM

## 2019-10-17 DIAGNOSIS — Z7901 Long term (current) use of anticoagulants: Secondary | ICD-10-CM

## 2019-10-17 DIAGNOSIS — Z86718 Personal history of other venous thrombosis and embolism: Secondary | ICD-10-CM | POA: Diagnosis not present

## 2019-10-17 DIAGNOSIS — D721 Eosinophilia, unspecified: Secondary | ICD-10-CM | POA: Diagnosis not present

## 2019-10-17 LAB — POCT INR: INR: 2.3 (ref 2.0–3.0)

## 2019-10-17 MED ORDER — OXYCODONE-ACETAMINOPHEN 5-325 MG PO TABS: 1.0000 | ORAL_TABLET | Freq: Two times a day (BID) | ORAL | 0 refills | Status: DC | PRN

## 2019-10-18 LAB — CBC WITH DIFFERENTIAL/PLATELET
Basophils Absolute: 0 10*3/uL (ref 0.0–0.2)
Basos: 1 %
EOS (ABSOLUTE): 0.4 10*3/uL (ref 0.0–0.4)
Eos: 8 %
Hematocrit: 43.9 % (ref 37.5–51.0)
Hemoglobin: 14.1 g/dL (ref 13.0–17.7)
Immature Grans (Abs): 0 10*3/uL (ref 0.0–0.1)
Immature Granulocytes: 1 %
Lymphocytes Absolute: 2.2 10*3/uL (ref 0.7–3.1)
Lymphs: 44 %
MCH: 26.2 pg — ABNORMAL LOW (ref 26.6–33.0)
MCHC: 32.1 g/dL (ref 31.5–35.7)
MCV: 82 fL (ref 79–97)
Monocytes Absolute: 0.3 10*3/uL (ref 0.1–0.9)
Monocytes: 7 %
Neutrophils Absolute: 1.9 10*3/uL (ref 1.4–7.0)
Neutrophils: 39 %
Platelets: 210 10*3/uL (ref 150–450)
RBC: 5.38 x10E6/uL (ref 4.14–5.80)
RDW: 13.6 % (ref 11.6–15.4)
WBC: 4.9 10*3/uL (ref 3.4–10.8)

## 2019-10-18 LAB — HEMOGLOBIN A1C
Est. average glucose Bld gHb Est-mCnc: 120 mg/dL
Hgb A1c MFr Bld: 5.8 % — ABNORMAL HIGH (ref 4.8–5.6)

## 2019-10-18 LAB — BMP8+ANION GAP
Anion Gap: 13 mmol/L (ref 10.0–18.0)
BUN/Creatinine Ratio: 14 (ref 10–24)
BUN: 12 mg/dL (ref 8–27)
CO2: 20 mmol/L (ref 20–29)
Calcium: 9.7 mg/dL (ref 8.6–10.2)
Chloride: 108 mmol/L — ABNORMAL HIGH (ref 96–106)
Creatinine, Ser: 0.84 mg/dL (ref 0.76–1.27)
GFR calc Af Amer: 107 mL/min/{1.73_m2} (ref >59)
GFR calc non Af Amer: 93 mL/min/{1.73_m2} (ref >59)
Glucose: 89 mg/dL (ref 65–99)
Potassium: 4.3 mmol/L (ref 3.5–5.2)
Sodium: 141 mmol/L (ref 134–144)

## 2019-10-20 DIAGNOSIS — M160 Bilateral primary osteoarthritis of hip: Secondary | ICD-10-CM | POA: Insufficient documentation

## 2019-10-20 NOTE — Assessment & Plan Note (Signed)
HPI: He has been told before that he has osteoarthritis of hips.  His left hip has been bothering him more lately.  He is wondering if the arthritis has progressed.  Assessment Primary OA of hips  Plan   I discussed the nature of osteoarthritis and likely has progressed.  I do think it be reasonable since his last x-ray about 5 years ago to repeat imaging of the area.

## 2019-10-20 NOTE — Assessment & Plan Note (Signed)
We discussed the BMI of 33.  He has lost some weight and will continue his diet.

## 2019-10-20 NOTE — Assessment & Plan Note (Signed)
Repeat lipid panel continue atorvastatin 

## 2019-10-20 NOTE — Assessment & Plan Note (Signed)
HPI: He continues to be bothered by bilateral knee pain with the left being worse.  This has been a difficult situation because he has a ataxic gait and left leg orthotic device due to the residual spastic hemiplegia from his stroke.  He is seen orthopedics in the past and deemed to not be a candidate for surgery because he would fail the physical therapy afterwards.  He has continued to receive benefit from oxycodone acetaminophen.  Assessment osteoarthritis of bilateral knees  Plan Continue oxycodone acetaminophen 5-3 25  #60 per month

## 2019-10-20 NOTE — Assessment & Plan Note (Signed)
He remains on lifelong warfarin therapy no bleeding.

## 2019-10-20 NOTE — Assessment & Plan Note (Signed)
HPI: He inquires about a orthotic DME order placed by Dr. Eppie Gibson for his left foot drop this would allow him to ambulate around the house without his large external leg brace.  Assessment spastic hemiplegia affecting left nondominant side  Plan I have tried to reorder the DME equipment.

## 2019-10-20 NOTE — Assessment & Plan Note (Signed)
He is interested in continuing low-dose CT for lung cancer screening.  He is also interested in a abdominal aortic aneurysm screening.

## 2019-10-20 NOTE — Progress Notes (Signed)
Subjective:  HPI: Alexander English is a 64 y.o. male who presents for f.u nephrolithiasis, HLD  Please see Assessment and Plan below for the status of his chronic medical problems.  Objective:  Physical Exam: Vitals:   10/17/19 1032  BP: 135/69  Pulse: 76  Temp: 98.2 F (36.8 C)  TempSrc: Oral  SpO2: 99%  Weight: 244 lb 8 oz (110.9 kg)  Height: 6' (1.829 m)   Body mass index is 33.16 kg/m. Physical Exam Vitals and nursing note reviewed.  Constitutional:      Appearance: Normal appearance.  Cardiovascular:     Rate and Rhythm: Normal rate and regular rhythm.  Pulmonary:     Effort: Pulmonary effort is normal.     Breath sounds: Normal breath sounds.  Musculoskeletal:     Right hip: No tenderness.     Left hip: No tenderness.     Right knee: No effusion. Tenderness present over the medial joint line. No lateral joint line tenderness.     Left knee: Effusion present. Tenderness present over the medial joint line and lateral joint line.  Neurological:     Mental Status: He is alert.    Assessment & Plan:  See Encounters Tab for problem based charting.  Medications Ordered Meds ordered this encounter  Medications  . oxyCODONE-acetaminophen (PERCOCET/ROXICET) 5-325 MG tablet    Sig: Take 1 tablet by mouth every 12 (twelve) hours as needed for severe pain.    Dispense:  60 tablet    Refill:  0    Rx 1/2 chronic pain  . oxyCODONE-acetaminophen (PERCOCET/ROXICET) 5-325 MG tablet    Sig: Take 1 tablet by mouth every 12 (twelve) hours as needed for severe pain.    Dispense:  60 tablet    Refill:  0    Rx 2/2 chronic pain   Other Orders Orders Placed This Encounter  Procedures  . For home use only DME Other see comment    Please fit patient for Left Ankle Foot Orthosis (AFO) to fit at the calf so he can ambulate about the house without foot drop.  Thanks.    Order Specific Question:   Length of Need    Answer:   [facility]  . CT CHEST LUNG CA SCREEN LOW DOSE  W/O CM    Standing Status:   Future    Standing Expiration Date:   12/16/2020    Order Specific Question:   Reason for Exam (SYMPTOM  OR DIAGNOSIS REQUIRED)    Answer:   lung cancer screening in patient with history of smoking    Order Specific Question:   Radiology Contrast Protocol - do NOT remove file path    Answer:   \\[email or web address]\CTProtocols.pdf  . DG Pelvis Comp Min 3V    Standing Status:   Future    Standing Expiration Date:   12/16/2020    Order Specific Question:   Reason for Exam (SYMPTOM  OR DIAGNOSIS REQUIRED)    Answer:   bilateral chronic hip pain L>R    Order Specific Question:   Preferred imaging location?    Answer:   [facility]    Order Specific Question:   Radiology Contrast Protocol - do NOT remove file path    Answer:   \\[email or web address]\[email or web address]\DXFluoroContrastProtocols.pdf  . Pneumococcal polysaccharide vaccine 23-valent greater than or equal to 2yo subcutaneous/IM  . BMP8+Anion Gap  . Urinalysis, Reflex Microscopic  . Hemoglobin A1c  . CBC with Diff  . POCT INR   Follow Up: Return in  about 3 months (around 01/16/2020).

## 2019-10-20 NOTE — Assessment & Plan Note (Signed)
HPI: His last visit he was noted to have some gross hematuria.  He was sent to urology which found a nonobstructing nephrolithiasis as the likely cause.  There appears to have been a follow-up visit in December but I do not have records for this visit to ensure clearance.  I had ordered a UA today to document clearance of the hematuria although he was not able to provide a sample so we can defer this for now.  Assessment left nephrolithiasis  Plan As above I plan to obtain UA.

## 2019-10-20 NOTE — Assessment & Plan Note (Signed)
I have repeated a CBC with differential and his eosinophil count has returned to normal

## 2019-10-21 ENCOUNTER — Other Ambulatory Visit: Payer: Medicare HMO

## 2019-10-21 ENCOUNTER — Other Ambulatory Visit: Payer: Self-pay | Admitting: Internal Medicine

## 2019-10-21 ENCOUNTER — Other Ambulatory Visit: Payer: Self-pay

## 2019-10-21 ENCOUNTER — Ambulatory Visit (HOSPITAL_COMMUNITY)
Admission: RE | Admit: 2019-10-21 | Discharge: 2019-10-21 | Disposition: A | Discharge status: Home / Self Care | Payer: Medicare HMO | Source: Ambulatory Visit | Attending: Internal Medicine | Admitting: Internal Medicine

## 2019-10-21 ENCOUNTER — Telehealth: Payer: Self-pay

## 2019-10-21 DIAGNOSIS — N200 Calculus of kidney: Secondary | ICD-10-CM | POA: Diagnosis not present

## 2019-10-21 DIAGNOSIS — M25551 Pain in right hip: Secondary | ICD-10-CM | POA: Diagnosis not present

## 2019-10-21 DIAGNOSIS — M160 Bilateral primary osteoarthritis of hip: Secondary | ICD-10-CM

## 2019-10-21 DIAGNOSIS — M25552 Pain in left hip: Secondary | ICD-10-CM | POA: Diagnosis not present

## 2019-10-21 NOTE — Telephone Encounter (Signed)
DME order placed for patient to get Left Ankle Foot Orthosis. I called [facility] where patient has been before I am faxing office note demographics,DME Order to [phone]. Patient has been notifed that [facility] will get in touch with him,patient understood [place], [place] [id number] PM

## 2019-10-22 LAB — URINALYSIS, ROUTINE W REFLEX MICROSCOPIC
Bilirubin, UA: NEGATIVE
Glucose, UA: NEGATIVE
Ketones, UA: NEGATIVE
Nitrite, UA: NEGATIVE
Specific Gravity, UA: 1.026 (ref 1.005–1.030)
Urobilinogen, Ur: 0.2 mg/dL (ref 0.2–1.0)
pH, UA: 5 (ref 5.0–7.5)

## 2019-10-22 LAB — MICROSCOPIC EXAMINATION
Bacteria, UA: NONE SEEN
Casts: NONE SEEN /lpf

## 2019-11-04 ENCOUNTER — Ambulatory Visit: Payer: Medicare HMO

## 2019-11-08 ENCOUNTER — Telehealth: Payer: Self-pay | Admitting: Internal Medicine

## 2019-11-18 ENCOUNTER — Other Ambulatory Visit: Payer: Self-pay

## 2019-11-18 ENCOUNTER — Ambulatory Visit (HOSPITAL_COMMUNITY)
Admission: RE | Admit: 2019-11-18 | Discharge: 2019-11-18 | Disposition: A | Discharge status: Home / Self Care | Payer: Medicare HMO | Source: Ambulatory Visit | Attending: Internal Medicine | Admitting: Internal Medicine

## 2019-11-18 DIAGNOSIS — Z87891 Personal history of nicotine dependence: Secondary | ICD-10-CM

## 2019-11-24 ENCOUNTER — Other Ambulatory Visit: Payer: Self-pay | Admitting: Pharmacist

## 2019-11-24 DIAGNOSIS — Z86711 Personal history of pulmonary embolism: Secondary | ICD-10-CM

## 2019-12-02 ENCOUNTER — Ambulatory Visit (INDEPENDENT_AMBULATORY_CARE_PROVIDER_SITE_OTHER): Payer: Medicare HMO | Admitting: Pharmacist

## 2019-12-02 DIAGNOSIS — Z7901 Long term (current) use of anticoagulants: Secondary | ICD-10-CM | POA: Diagnosis not present

## 2019-12-02 DIAGNOSIS — Z86711 Personal history of pulmonary embolism: Secondary | ICD-10-CM

## 2019-12-02 DIAGNOSIS — Z86718 Personal history of other venous thrombosis and embolism: Secondary | ICD-10-CM

## 2019-12-02 LAB — POCT INR: INR: 1.9 — AB (ref 2.0–3.0)

## 2019-12-02 MED ORDER — WARFARIN SODIUM 5 MG PO TABS: ORAL_TABLET | ORAL | 0 refills | Status: DC

## 2019-12-02 NOTE — Patient Instructions (Signed)
Patient instructed to take medications as defined in the Anti-coagulation Track section of this encounter.  Patient instructed to take today's dose.  Patient instructed to take one-tablet (1) on all days of week, EXCEPT on Mondays, and Thursdays--take ONLY one-half tablet (1/2 tablet).  Patient verbalized understanding of these instructions.

## 2019-12-02 NOTE — Progress Notes (Signed)
Anticoagulation Management Alexander English is a 64 y.o. male who reports to the clinic for monitoring of warfarin treatment.    Indication: History of VTE; Long term current use of anticoagulant.    Duration: indefinite Supervising physician: Joni Reining  Anticoagulation Clinic Visit History: Patient does not report signs/symptoms of bleeding or thromboembolism  Other recent changes: No diet, medications, lifestyle changes reported.  Anticoagulation Episode Summary    Current INR goal:  2.0-3.0  TTR:  82.8 % (7.7 y)  Next INR check:  01/13/2020  INR from last check:  1.9 (12/02/2019)  Weekly max warfarin dose:    Target end date:  Indefinite  INR check location:  Anticoagulation Clinic  Preferred lab:    Send INR reminders to:  [facility]   Indications   History of venous thromboembolism [Z86.718]       Comments:          No Known Allergies  Current Outpatient Medications:  .  aspirin EC 81 MG tablet, Take 81 mg by mouth daily., Disp: , Rfl:  .  atorvastatin (LIPITOR) 10 MG tablet, Take 1 tablet (10 mg total) by mouth daily., Disp: 90 tablet, Rfl: 3 .  benzoyl peroxide 5 % external liquid, Apply topically 2 (two) times daily., Disp: 140 g, Rfl: 11 .  fesoterodine (TOVIAZ) 8 MG TB24 tablet, Take 1 tablet (8 mg total) by mouth daily., Disp: 90 tablet, Rfl: 3 .  guaiFENesin (ROBITUSSIN) 100 MG/5ML SOLN, Take 5 mLs (100 mg total) by mouth every 4 (four) hours as needed for cough or to loosen phlegm., Disp: 236 mL, Rfl: 0 .  Multiple Vitamin (MULTIVITAMIN) tablet, Take 1 tablet by mouth daily., Disp: , Rfl:  .  MYRBETRIQ 25 MG TB24 tablet, Take 25 mg by mouth daily., Disp: , Rfl:  .  oxyCODONE-acetaminophen (PERCOCET/ROXICET) 5-325 MG tablet, Take 1 tablet by mouth every 12 (twelve) hours as needed for severe pain., Disp: 60 tablet, Rfl: 0 .  oxyCODONE-acetaminophen (PERCOCET/ROXICET) 5-325 MG tablet, Take 1 tablet by mouth every 12 (twelve) hours as needed for severe pain.,  Disp: 60 tablet, Rfl: 0 .  promethazine (PHENERGAN) 25 MG suppository, Place 1 suppository (25 mg total) rectally every 6 (six) hours as needed for nausea or vomiting., Disp: 12 suppository, Rfl: 1 .  sertraline (ZOLOFT) 50 MG tablet, Take 1 tablet (50 mg total) by mouth daily., Disp: 90 tablet, Rfl: 3 .  sorbitol 70 % SOLN, Take 15-30 mLs by mouth daily as needed for moderate constipation., Disp: 473 mL, Rfl: 11 .  tamsulosin (FLOMAX) 0.4 MG CAPS capsule, , Disp: , Rfl:  .  warfarin (COUMADIN) 5 MG tablet, TAKE 1 TABLET BY MOUTH ONCE DAILY AT  6  IN  THE  EVENING,  EXCEPT  ON  MONDAY  AND  THURSDAY  TAKE  ONE-HALF  TABLET, Disp: 100 tablet, Rfl: 0 .  zolpidem (AMBIEN) 5 MG tablet, Take 1 tablet (5 mg total) by mouth at bedtime as needed for sleep., Disp: 30 tablet, Rfl: 5 Past Medical History:  Diagnosis Date  . Aortic atherosclerosis ([place]) 10/23/2017   Asymptomatic, seen on CT scan  . Bilateral cataracts 05/18/2016   Not visually significant  . Chronic constipation 06/12/2013  . Chronic low back pain 06/12/2013   L4-5 facet arthropathy   . Chronic pain of both shoulders 12/07/2013   A/C joint osteoarthritis   . Cluster headaches    Resolved in 2006  . COPD (chronic obstructive pulmonary disease) ([place])   .  Dry eye syndrome, bilateral 05/18/2016  . Embolic stroke involving right middle cerebral artery ([place]) 08/08/2004   Occured after traveling from [place] to [place] where he was hospitalized for 1 month with no further work-up or therapy.  Returned to [place] unable to walk and was admitted to [facility] immediately after landing. Presumed embolic per neuro but TEE, bubble study, and hypercoag W/U negative. On indefinite coumadin per patient's informed preference.  Residual effects : Left spastic hemiplegia. Tx with phenol tibi  . Generalized anxiety disorder 08/03/2018  . History of kidney stones   . Hyperplastic colon polyp 07/27/2012   Excised endoscopically 07/27/2012  . Hypertensive retinopathy of both  eyes 05/18/2016  . Internal and external hemorrhoids without complication 1/32/4401   Seen on colonoscopy 04/03/2007.  Marland Kitchen Left nephrolithiasis 08/31/2015   With mild left hydronephrosis.  Scheduled to undergo shockwave lithotripsy.  . Obesity (BMI 30.0-34.9) 12/07/2013  . Osteoarthritis of both knees 01/26/2012  . Positive PPD 12/07/2013  . Pulmonary embolism ([place]) 09/30/2004   Diagnosed in April 2006 after returning from [place].  Likely provoked as it occurred after a 12 hour plane flight within 2 months of R MCA CVA with left hemiparesis. Patient has made an informed decision to remain on lifelong warfarin.   . Tubular adenoma 12/19/2017   Endoscopically excised 04/03/2007.  Marland Kitchen Urge incontinence of urine 08/31/2015   Possibly secondary to prior CVA.  Treated with Myrbetriq.   Social History   Socioeconomic History  . Marital status: Divorced    Spouse name: Not on file  . Number of children: Not on file  . Years of education: Not on file  . Highest education level: Not on file  Occupational History  . Not on file  Tobacco Use  . Smoking status: Former Smoker    Types: Cigarettes    Quit date: 06/28/2007    Years since quitting: 12.4  . Smokeless tobacco: Never Used  Substance and Sexual Activity  . Alcohol use: Yes    Alcohol/week: 0.0 standard drinks    Comment: Rarely.  . Drug use: No  . Sexual activity: Not on file  Other Topics Concern  . Not on file  Social History Narrative   Born in [place] but has been in the [place] for > 30 years.  Divorced, 1 daughter and 2 sons.  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   Social Determinants of Health   Financial Resource Strain:   . Difficulty of Paying Living Expenses:   Food Insecurity:   . Worried About Charity fundraiser in the Last Year:   . Arboriculturist in the Last Year:   Transportation Needs:   . Film/video editor (Medical):   Marland Kitchen Lack of Transportation (Non-Medical):     Physical Activity: Inactive  . Days of Exercise per Week: 0 days  . Minutes of Exercise per Session: 0 min  Stress: No Stress Concern Present  . Feeling of Stress : Only a little  Social Connections: Somewhat Isolated  . Frequency of Communication with Friends and Family: More than three times a week  . Frequency of Social Gatherings with Friends and Family: Three times a week  . Attends Religious Services: 1 to 4 times per year  . Active Member of Clubs or Organizations: No  . Attends Archivist Meetings: Never  . Marital Status: Divorced   Family History  Problem Relation Age of Onset  . Stroke  Maternal Grandmother   . Unexplained death Mother   . Depression Mother        After husband's death  . Unexplained death Father   . Osteoarthritis Father        Knees  . Early death Sister   . Seizures Sister   . Early death Brother 12       Unknown cause  . Healthy Daughter   . Healthy Son   . Stroke Maternal Aunt   . Healthy Sister   . Healthy Sister   . Unexplained death Brother   . Healthy Brother   . Healthy Son     ASSESSMENT Recent Results: The most recent result is correlated with 30 mg per week: Lab Results  Component Value Date   INR 1.9 (A) 12/02/2019   INR 2.3 10/17/2019   INR 3.0 09/23/2019    Anticoagulation Dosing: Description   Take one (1) tablet of your 5mg  peach-colored warfarin tablets by mouth, once-daily except on Mondays and Thursdays take 1/2 tablet at 6PM.         INR today: Subtherapeutic  PLAN Weekly dose was unchanged.   Patient Instructions  Patient instructed to take medications as defined in the Anti-coagulation Track section of this encounter.  Patient instructed to take today's dose.  Patient instructed to take one-tablet (1) on all days of week, EXCEPT on Mondays, and Thursdays--take ONLY one-half tablet (1/2 tablet).  Patient verbalized understanding of these instructions.    Patient advised to contact clinic or  seek medical attention if signs/symptoms of bleeding or thromboembolism occur.  Patient verbalized understanding by repeating back information and was advised to contact me if further medication-related questions arise. Patient was also provided an information handout.  Follow-up Return in 6 weeks (on 01/13/2020) for Follow up INR.  [name], PharmD, CPP   15 minutes spent face-to-face with the patient during the encounter. 50% of time spent on education, including signs/sx bleeding and clotting, as well as food and drug interactions with warfarin. 50% of time was spent on fingerprick POC INR sample collection,processing, results determination, and documentation in [email or web address]/.

## 2019-12-10 DIAGNOSIS — M21372 Foot drop, left foot: Secondary | ICD-10-CM | POA: Diagnosis not present

## 2019-12-23 ENCOUNTER — Other Ambulatory Visit: Payer: Self-pay | Admitting: Internal Medicine

## 2019-12-23 DIAGNOSIS — F409 Phobic anxiety disorder, unspecified: Secondary | ICD-10-CM

## 2019-12-23 MED ORDER — ZOLPIDEM TARTRATE 5 MG PO TABS: 5.0000 mg | ORAL_TABLET | Freq: Every evening | ORAL | 5 refills | Status: DC | PRN

## 2019-12-23 NOTE — Telephone Encounter (Signed)
Need refill on zolpidem (AMBIEN) 5 MG tablet ;pt contact [facility] ([facility] [place]

## 2020-01-02 ENCOUNTER — Encounter: Payer: Medicare HMO | Admitting: Internal Medicine

## 2020-01-09 ENCOUNTER — Encounter: Payer: Medicare HMO | Admitting: Internal Medicine

## 2020-01-09 ENCOUNTER — Telehealth: Payer: Self-pay | Admitting: Pharmacist

## 2020-01-09 ENCOUNTER — Other Ambulatory Visit: Payer: Self-pay

## 2020-01-09 ENCOUNTER — Encounter: Payer: Self-pay | Admitting: Internal Medicine

## 2020-01-09 ENCOUNTER — Ambulatory Visit (INDEPENDENT_AMBULATORY_CARE_PROVIDER_SITE_OTHER): Payer: Medicare HMO | Admitting: Internal Medicine

## 2020-01-09 VITALS — BP 139/81 | HR 72 | Temp 97.6°F | Ht 72.0 in | Wt 242.6 lb

## 2020-01-09 DIAGNOSIS — Z7901 Long term (current) use of anticoagulants: Secondary | ICD-10-CM | POA: Diagnosis not present

## 2020-01-09 DIAGNOSIS — R413 Other amnesia: Secondary | ICD-10-CM | POA: Diagnosis not present

## 2020-01-09 DIAGNOSIS — R251 Tremor, unspecified: Secondary | ICD-10-CM

## 2020-01-09 DIAGNOSIS — G252 Other specified forms of tremor: Secondary | ICD-10-CM

## 2020-01-09 DIAGNOSIS — Z86718 Personal history of other venous thrombosis and embolism: Secondary | ICD-10-CM

## 2020-01-09 DIAGNOSIS — G8110 Spastic hemiplegia affecting unspecified side: Secondary | ICD-10-CM

## 2020-01-09 DIAGNOSIS — Z0000 Encounter for general adult medical examination without abnormal findings: Secondary | ICD-10-CM

## 2020-01-09 MED ORDER — PROPRANOLOL HCL ER 60 MG PO CP24: 60.0000 mg | ORAL_CAPSULE | Freq: Every day | ORAL | 11 refills | Status: DC

## 2020-01-09 NOTE — Telephone Encounter (Signed)
Thank you :)

## 2020-01-09 NOTE — Telephone Encounter (Signed)
Called to [facility] to interpret and provide dosing instructions for the patient, on warfarin. He is seeing a physician today in the [facility]. Patient was scheduled to see me in the [facility] on 19-JUL-21. Fingerstick point-of-care INR today in the [facility] = 1.8. on 30mg  warfarin/wk. States he has not missed any doses, no new medications. Adequate supply of warfarin on-hand. Have increased to 35mg  warfarin/wk with [facility] Antico[facility] Management Clinic scheduled for 9-AUG-21 at 9:15am. Denies bleeding signs or symptoms.

## 2020-01-09 NOTE — Patient Instructions (Signed)
Alexander English,  It was a pleasure seeing you in clinic. Today we discussed:   Memory issues and right hand tremor: I will check your thyroid level today. I am also starting you on a low dose beta blocker to help with the tremor and will send referral to neurology.   I will contact you with any abnormal results.   If you have any questions or concerns, please call our clinic at [phone] between 9am-5pm and after hours call [phone] and ask for the internal medicine resident on call. If you feel you are having a medical emergency please call 911.   Thank you, we look forward to helping you remain healthy!   If you have not already done so, I recommend getting the COVID 19 vaccine.  To schedule an appointment for a COVID vaccine or be added to the vaccine wait list: Go to [email or web address]   OR Go to [email or web address]                  OR Call [phone]                                     OR Call [phone] and select Option 2

## 2020-01-10 DIAGNOSIS — R413 Other amnesia: Secondary | ICD-10-CM | POA: Insufficient documentation

## 2020-01-10 DIAGNOSIS — G252 Other specified forms of tremor: Secondary | ICD-10-CM | POA: Insufficient documentation

## 2020-01-10 NOTE — Assessment & Plan Note (Addendum)
Patient on warfarin therapy for PE. INR today 1.9 (goal 2-3)  Plan - Warfarin dosing per coumadin clinic

## 2020-01-10 NOTE — Assessment & Plan Note (Addendum)
Pt concerned about memory changes over past several months. He endorses multiple episodes of forgetfulness causing him distress. He is concerned about memory loss. He denies feeling depressed and PHQ9 score is 2. He is on zoloft for generalized anxiety disorder and tolerating this well. He is concerned about early signs of Alzheimers dementia. Suspect his intermittent forgetfulness is likely due to aging.  If persistent, would evaluate further with [facility].  Plan: TSH Continue to monitor

## 2020-01-10 NOTE — Assessment & Plan Note (Addendum)
Patient endorses tremor in the right upper extremity for the past six months. He endorses that this is intermittent and usually occurs when he is holding an object or performing action against gravity. Improvement with resting hand/arm against the table. He has not noticed this tremor at rest. No known family history of essential tremors.  No resting tremor noted throughout the encounter. No intention tremor noted on finger to nose test. However, does have low amplitude high frequency tremor when holding the clipboard against gravity. Unclear etiology of patient's tremor - may be related to overuse vs essential tremor.   Plan: TSH Propanolol 60mg  daily  Referral to neurology

## 2020-01-10 NOTE — Progress Notes (Signed)
   CC: hand tremor, memory issues   HPI:  [name] is a 64 y.o. male with PMHx as listed below presenting for evaluation of memory issues and right hand tremor for the past 6 months. Please see problem based charting for complete assessment and plan.  Past Medical History:  Diagnosis Date  . Aortic atherosclerosis ([place]) 10/23/2017   Asymptomatic, seen on CT scan  . Bilateral cataracts 05/18/2016   Not visually significant  . Chronic constipation 06/12/2013  . Chronic low back pain 06/12/2013   L4-5 facet arthropathy   . Chronic pain of both shoulders 12/07/2013   A/C joint osteoarthritis   . Cluster headaches    Resolved in 2006  . COPD (chronic obstructive pulmonary disease) ([place])   . Dry eye syndrome, bilateral 05/18/2016  . Embolic stroke involving right middle cerebral artery ([place]) 08/08/2004   Occured after traveling from [place] to [place] where he was hospitalized for 1 month with no further work-up or therapy.  Returned to [place] unable to walk and was admitted to [facility] immediately after landing. Presumed embolic per neuro but TEE, bubble study, and hypercoag W/U negative. On indefinite coumadin per patient's informed preference.  Residual effects : Left spastic hemiplegia. Tx with phenol tibi  . Generalized anxiety disorder 08/03/2018  . History of kidney stones   . Hyperplastic colon polyp 07/27/2012   Excised endoscopically 07/27/2012  . Hypertensive retinopathy of both eyes 05/18/2016  . Internal and external hemorrhoids without complication 02/03/1750   Seen on colonoscopy 04/03/2007.  Marland Kitchen Left nephrolithiasis 08/31/2015   With mild left hydronephrosis.  Scheduled to undergo shockwave lithotripsy.  . Obesity (BMI 30.0-34.9) 12/07/2013  . Osteoarthritis of both knees 01/26/2012  . Positive PPD 12/07/2013  . Pulmonary embolism ([place]) 09/30/2004   Diagnosed in April 2006 after returning from [place].  Likely provoked as it occurred after a 12 hour plane flight within 2 months of R MCA  CVA with left hemiparesis. Patient has made an informed decision to remain on lifelong warfarin.   . Tubular adenoma 12/19/2017   Endoscopically excised 04/03/2007.  Marland Kitchen Urge incontinence of urine 08/31/2015   Possibly secondary to prior CVA.  Treated with Myrbetriq.   Review of Systems:  Negative except as stated in HPI.  Physical Exam:  Vitals:   01/09/20 0847  BP: 139/81  Pulse: 72  Temp: 97.6 F (36.4 C)  TempSrc: Oral  SpO2: 99%  Weight: 242 lb 9.6 oz (110 kg)  Height: 6' (1.829 m)   Physical Exam  Constitutional: Appears well-developed and well-nourished. No distress.  HENT: Normocephalic and atraumatic, EOMI, conjunctiva normal, moist mucous membranes Cardiovascular: Normal rate, regular rhythm, S1 and S2 present, no murmurs, rubs, gallops.  Distal pulses intact Respiratory: No respiratory distress, no accessory muscle use.  Effort is normal.  Lungs are clear to auscultation bilaterally. GI: Nondistended, soft, nontender to palpation Musculoskeletal: Normal bulk and tone of dominant hand; strength 5/5 RUE, RLE and LLE, 4/5 in LUE. Sensation grossly intact in all extremities; spastic paralysis of the LUE Neurological: Is alert and oriented x4, LUE with spastic paralysis at baseline; no other focal deficits noted  Skin: Warm and dry.  No rash, erythema, lesions noted. Psychiatric: Normal mood and affect. Behavior is normal. Judgment and thought content normal.    Assessment & Plan:   See Encounters Tab for problem based charting.  Patient discussed with Dr. Daryll Drown

## 2020-01-10 NOTE — Assessment & Plan Note (Signed)
Pt following up on results for CT screening for lung cancer. Advised that this was negative. He expresses understanding. Asymptomatic.

## 2020-01-13 ENCOUNTER — Other Ambulatory Visit: Payer: Self-pay | Admitting: Internal Medicine

## 2020-01-13 ENCOUNTER — Ambulatory Visit: Payer: Medicare HMO

## 2020-01-13 DIAGNOSIS — G252 Other specified forms of tremor: Secondary | ICD-10-CM

## 2020-01-14 DIAGNOSIS — C310 Malignant neoplasm of maxillary sinus: Secondary | ICD-10-CM | POA: Diagnosis not present

## 2020-01-15 NOTE — Progress Notes (Signed)
Internal Medicine Clinic Attending ° °Case discussed with Dr. Aslam  At the time of the visit.  We reviewed the resident’s history and exam and pertinent patient test results.  I agree with the assessment, diagnosis, and plan of care documented in the resident’s note.  °

## 2020-01-16 ENCOUNTER — Encounter: Payer: Medicare HMO | Admitting: Internal Medicine

## 2020-01-17 NOTE — Addendum Note (Signed)
Addended by: Truddie Crumble on: 01/17/2020 11:06 AM   Modules accepted: Orders

## 2020-01-21 ENCOUNTER — Encounter: Payer: Self-pay | Admitting: *Deleted

## 2020-02-03 ENCOUNTER — Ambulatory Visit (INDEPENDENT_AMBULATORY_CARE_PROVIDER_SITE_OTHER): Payer: Medicare HMO | Admitting: Pharmacist

## 2020-02-03 ENCOUNTER — Other Ambulatory Visit: Payer: Self-pay

## 2020-02-03 ENCOUNTER — Other Ambulatory Visit (INDEPENDENT_AMBULATORY_CARE_PROVIDER_SITE_OTHER): Payer: Medicare HMO

## 2020-02-03 DIAGNOSIS — Z7901 Long term (current) use of anticoagulants: Secondary | ICD-10-CM

## 2020-02-03 DIAGNOSIS — Z86718 Personal history of other venous thrombosis and embolism: Secondary | ICD-10-CM

## 2020-02-03 DIAGNOSIS — R251 Tremor, unspecified: Secondary | ICD-10-CM

## 2020-02-03 LAB — POCT INR: INR: 3.6 — AB (ref 2.0–3.0)

## 2020-02-03 NOTE — Patient Instructions (Signed)
Patient instructed to take medications as defined in the Anti-coagulation Track section of this encounter.  Patient instructed to take today's dose.  Patient instructed to take one (1) tablet of your 5mg peach-colored warfarin tablets by mouth, once-daily except on Mondays and Thursdays take 1/2 tablet at 6PM.   Patient verbalized understanding of these instructions.   

## 2020-02-03 NOTE — Progress Notes (Signed)
Anticoagulation Management Alexander English is a 64 y.o. male who reports to the clinic for monitoring of warfarin treatment.    Indication: DVT, History of (resolved); Long term current use of anticoagulant, warfarin.  Duration: indefinite Supervising physician: Alexander English  Anticoagulation Clinic Visit History: Patient does not report signs/symptoms of bleeding or thromboembolism  Other recent changes: No diet, medications, lifestyle changes.  Anticoagulation Episode Summary    Current INR goal:  2.0-3.0  TTR:  82.2 % (7.9 y)  Next INR check:  03/09/2020  INR from last check:  3.6 (02/03/2020)  Weekly max warfarin dose:    Target end date:  Indefinite  INR check location:  Anticoagulation Clinic  Preferred lab:    Send INR reminders to:  [facility]   Indications   History of venous thromboembolism [Z86.718]       Comments:          No Known Allergies  Current Outpatient Medications:  .  aspirin EC 81 MG tablet, Take 81 mg by mouth daily., Disp: , Rfl:  .  atorvastatin (LIPITOR) 10 MG tablet, Take 1 tablet (10 mg total) by mouth daily., Disp: 90 tablet, Rfl: 3 .  benzoyl peroxide 5 % external liquid, Apply topically 2 (two) times daily., Disp: 140 g, Rfl: 11 .  fesoterodine (TOVIAZ) 8 MG TB24 tablet, Take 1 tablet (8 mg total) by mouth daily., Disp: 90 tablet, Rfl: 3 .  guaiFENesin (ROBITUSSIN) 100 MG/5ML SOLN, Take 5 mLs (100 mg total) by mouth every 4 (four) hours as needed for cough or to loosen phlegm., Disp: 236 mL, Rfl: 0 .  Multiple Vitamin (MULTIVITAMIN) tablet, Take 1 tablet by mouth daily., Disp: , Rfl:  .  MYRBETRIQ 25 MG TB24 tablet, Take 25 mg by mouth daily., Disp: , Rfl:  .  oxyCODONE-acetaminophen (PERCOCET/ROXICET) 5-325 MG tablet, Take 1 tablet by mouth every 12 (twelve) hours as needed for severe pain., Disp: 60 tablet, Rfl: 0 .  oxyCODONE-acetaminophen (PERCOCET/ROXICET) 5-325 MG tablet, Take 1 tablet by mouth every 12 (twelve) hours as needed for  severe pain., Disp: 60 tablet, Rfl: 0 .  promethazine (PHENERGAN) 25 MG suppository, Place 1 suppository (25 mg total) rectally every 6 (six) hours as needed for nausea or vomiting., Disp: 12 suppository, Rfl: 1 .  propranolol ER (INDERAL LA) 60 MG 24 hr capsule, Take 1 capsule (60 mg total) by mouth daily., Disp: 30 capsule, Rfl: 11 .  sertraline (ZOLOFT) 50 MG tablet, Take 1 tablet (50 mg total) by mouth daily., Disp: 90 tablet, Rfl: 3 .  sorbitol 70 % SOLN, Take 15-30 mLs by mouth daily as needed for moderate constipation., Disp: 473 mL, Rfl: 11 .  tamsulosin (FLOMAX) 0.4 MG CAPS capsule, , Disp: , Rfl:  .  warfarin (COUMADIN) 5 MG tablet, TAKE 1 TABLET BY MOUTH ONCE DAILY AT  6  IN  THE  EVENING,  EXCEPT  ON  MONDAY  AND  THURSDAY  TAKE  ONE-HALF  TABLET, Disp: 100 tablet, Rfl: 0 .  zolpidem (AMBIEN) 5 MG tablet, Take 1 tablet (5 mg total) by mouth at bedtime as needed for sleep., Disp: 30 tablet, Rfl: 5 Past Medical History:  Diagnosis Date  . Aortic atherosclerosis ([place]) 10/23/2017   Asymptomatic, seen on CT scan  . Bilateral cataracts 05/18/2016   Not visually significant  . Chronic constipation 06/12/2013  . Chronic low back pain 06/12/2013   L4-5 facet arthropathy   . Chronic pain of both shoulders 12/07/2013   A/C  joint osteoarthritis   . Cluster headaches    Resolved in 2006  . COPD (chronic obstructive pulmonary disease) ([place])   . Dry eye syndrome, bilateral 05/18/2016  . Embolic stroke involving right middle cerebral artery ([place]) 08/08/2004   Occured after traveling from [place] to [place] where he was hospitalized for 1 month with no further work-up or therapy.  Returned to [place] unable to walk and was admitted to [facility] immediately after landing. Presumed embolic per neuro but TEE, bubble study, and hypercoag W/U negative. On indefinite coumadin per patient's informed preference.  Residual effects : Left spastic hemiplegia. Tx with phenol tibi  . Generalized anxiety disorder 08/03/2018  .  History of kidney stones   . Hyperplastic colon polyp 07/27/2012   Excised endoscopically 07/27/2012  . Hypertensive retinopathy of both eyes 05/18/2016  . Internal and external hemorrhoids without complication 10/27/7739   Seen on colonoscopy 04/03/2007.  Alexander Kitchen Left nephrolithiasis 08/31/2015   With mild left hydronephrosis.  Scheduled to undergo shockwave lithotripsy.  . Obesity (BMI 30.0-34.9) 12/07/2013  . Osteoarthritis of both knees 01/26/2012  . Positive PPD 12/07/2013  . Pulmonary embolism ([place]) 09/30/2004   Diagnosed in April 2006 after returning from [place].  Likely provoked as it occurred after a 12 hour plane flight within 2 months of R MCA CVA with left hemiparesis. Patient has made an informed decision to remain on lifelong warfarin.   . Tubular adenoma 12/19/2017   Endoscopically excised 04/03/2007.  Alexander Kitchen Urge incontinence of urine 08/31/2015   Possibly secondary to prior CVA.  Treated with Myrbetriq.   Social History   Socioeconomic History  . Marital status: Divorced    Spouse name: Not on file  . Number of children: Not on file  . Years of education: Not on file  . Highest education level: Not on file  Occupational History  . Not on file  Tobacco Use  . Smoking status: Former Smoker    Types: Cigarettes    Quit date: 06/28/2007    Years since quitting: 12.6  . Smokeless tobacco: Never Used  Vaping Use  . Vaping Use: Never used  Substance and Sexual Activity  . Alcohol use: Yes    Alcohol/week: 0.0 standard drinks    Comment: Rarely.  . Drug use: No  . Sexual activity: Not on file  Other Topics Concern  . Not on file  Social History Narrative   Born in [place] but has been in the [place] for > 30 years.  Divorced, 1 daughter and 2 sons.  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   Social Determinants of Health   Financial Resource Strain:   . Difficulty of Paying Living Expenses:   Food Insecurity:   . Worried About Sales executive in the Last Year:   . Arboriculturist in the Last Year:   Transportation Needs:   . Film/video editor (Medical):   Alexander Kitchen Lack of Transportation (Non-Medical):   Physical Activity:   . Days of Exercise per Week:   . Minutes of Exercise per Session:   Stress:   . Feeling of Stress :   Social Connections:   . Frequency of Communication with Friends and Family:   . Frequency of Social Gatherings with Friends and Family:   . Attends Religious Services:   . Active Member of Clubs or Organizations:   . Attends Archivist Meetings:   Alexander Kitchen Marital Status:  Family History  Problem Relation Age of Onset  . Stroke Maternal Grandmother   . Unexplained death Mother   . Depression Mother        After husband's death  . Unexplained death Father   . Osteoarthritis Father        Knees  . Early death Sister   . Seizures Sister   . Early death Brother 6       Unknown cause  . Healthy Daughter   . Healthy Son   . Stroke Maternal Aunt   . Healthy Sister   . Healthy Sister   . Unexplained death Brother   . Healthy Brother   . Healthy Son     ASSESSMENT Recent Results: The most recent result is correlated with 30 mg per week: Lab Results  Component Value Date   INR 3.6 (A) 02/03/2020   INR 1.9 (A) 12/02/2019   INR 2.3 10/17/2019    Anticoagulation Dosing: Description   Take one (1) tablet of your 5mg  peach-colored warfarin tablets by mouth, once-daily except on Mondays and Thursdays take 1/2 tablet at 6PM.         INR today: Supratherapeutic  PLAN Weekly dose was unchanged.   Patient Instructions  Patient instructed to take medications as defined in the Anti-coagulation Track section of this encounter.  Patient instructed to take today's dose.  Patient instructed to take one (1) tablet of your 5mg  peach-colored warfarin tablets by mouth, once-daily except on Mondays and Thursdays take 1/2 tablet at [facility].   Patient verbalized understanding of these  instructions.    Patient advised to contact clinic or seek medical attention if signs/symptoms of bleeding or thromboembolism occur.  Patient verbalized understanding by repeating back information and was advised to contact me if further medication-related questions arise. Patient was also provided an information handout.  Follow-up Return in 5 weeks (on 03/09/2020) for Follow up INR.  [name], PharmD CPP  15 minutes spent face-to-face with the patient during the encounter. 50% of time spent on education, including signs/sx bleeding and clotting, as well as food and drug interactions with warfarin. 50% of time was spent on fingerprick POC INR sample collection,processing, results determination, and documentation in [email or web address]/.

## 2020-02-04 LAB — TSH: TSH: 1.58 u[IU]/mL (ref 0.450–4.500)

## 2020-02-24 ENCOUNTER — Other Ambulatory Visit: Payer: Self-pay | Admitting: Pharmacist

## 2020-02-24 DIAGNOSIS — Z86711 Personal history of pulmonary embolism: Secondary | ICD-10-CM

## 2020-03-03 DIAGNOSIS — H40023 Open angle with borderline findings, high risk, bilateral: Secondary | ICD-10-CM | POA: Diagnosis not present

## 2020-03-03 DIAGNOSIS — H04123 Dry eye syndrome of bilateral lacrimal glands: Secondary | ICD-10-CM | POA: Diagnosis not present

## 2020-03-03 DIAGNOSIS — H25013 Cortical age-related cataract, bilateral: Secondary | ICD-10-CM | POA: Diagnosis not present

## 2020-03-03 DIAGNOSIS — H2513 Age-related nuclear cataract, bilateral: Secondary | ICD-10-CM | POA: Diagnosis not present

## 2020-03-04 LAB — HM DIABETES EYE EXAM

## 2020-03-05 ENCOUNTER — Encounter: Payer: Self-pay | Admitting: *Deleted

## 2020-03-09 ENCOUNTER — Ambulatory Visit (INDEPENDENT_AMBULATORY_CARE_PROVIDER_SITE_OTHER): Payer: Medicare HMO | Admitting: Student-PharmD

## 2020-03-09 ENCOUNTER — Other Ambulatory Visit: Payer: Self-pay

## 2020-03-09 DIAGNOSIS — Z23 Encounter for immunization: Secondary | ICD-10-CM

## 2020-03-09 DIAGNOSIS — Z86718 Personal history of other venous thrombosis and embolism: Secondary | ICD-10-CM

## 2020-03-09 LAB — POCT INR: INR: 4 — AB (ref 2.0–3.0)

## 2020-03-09 NOTE — Patient Instructions (Signed)
Patient instructed to take medications as defined in the Anti-coagulation Track section of this encounter.  Patient instructed to take today's dose.  Patient instructed to take one (1) tablet of your 5mg peach-colored warfarin tablets by mouth, once-daily except on Mondays and Thursdays take 1/2 tablet at 6PM.   Patient verbalized understanding of these instructions.   

## 2020-03-09 NOTE — Progress Notes (Signed)
Anticoagulation Management Alexander English is a 64 y.o. male who reports to the clinic for monitoring of warfarin treatment.     Indication: history of VTE Duration: indefinite Supervising physician: [name], MD, PhD  Anticoagulation Clinic Visit History: Patient does not report signs/symptoms of bleeding or thromboembolism  Other recent changes: No diet, medications, lifestyle changes Anticoagulation Episode Summary    Current INR goal:  2.0-3.0  TTR:  81.3 % (8 y)  Next INR check:  03/30/2020  INR from last check:  4.0 (03/09/2020)  Weekly max warfarin dose:    Target end date:  Indefinite  INR check location:  Anticoagulation Clinic  Preferred lab:    Send INR reminders to:  [facility]   Indications   History of venous thromboembolism [Z86.718]       Comments:          No Known Allergies  Current Outpatient Medications:    aspirin EC 81 MG tablet, Take 81 mg by mouth daily., Disp: , Rfl:    atorvastatin (LIPITOR) 10 MG tablet, Take 1 tablet (10 mg total) by mouth daily., Disp: 90 tablet, Rfl: 3   benzoyl peroxide 5 % external liquid, Apply topically 2 (two) times daily., Disp: 140 g, Rfl: 11   fesoterodine (TOVIAZ) 8 MG TB24 tablet, Take 1 tablet (8 mg total) by mouth daily., Disp: 90 tablet, Rfl: 3   guaiFENesin (ROBITUSSIN) 100 MG/5ML SOLN, Take 5 mLs (100 mg total) by mouth every 4 (four) hours as needed for cough or to loosen phlegm., Disp: 236 mL, Rfl: 0   Multiple Vitamin (MULTIVITAMIN) tablet, Take 1 tablet by mouth daily., Disp: , Rfl:    MYRBETRIQ 25 MG TB24 tablet, Take 25 mg by mouth daily., Disp: , Rfl:    oxyCODONE-acetaminophen (PERCOCET/ROXICET) 5-325 MG tablet, Take 1 tablet by mouth every 12 (twelve) hours as needed for severe pain., Disp: 60 tablet, Rfl: 0   oxyCODONE-acetaminophen (PERCOCET/ROXICET) 5-325 MG tablet, Take 1 tablet by mouth every 12 (twelve) hours as needed for severe pain., Disp: 60 tablet, Rfl: 0   promethazine  (PHENERGAN) 25 MG suppository, Place 1 suppository (25 mg total) rectally every 6 (six) hours as needed for nausea or vomiting., Disp: 12 suppository, Rfl: 1   propranolol ER (INDERAL LA) 60 MG 24 hr capsule, Take 1 capsule (60 mg total) by mouth daily., Disp: 30 capsule, Rfl: 11   sertraline (ZOLOFT) 50 MG tablet, Take 1 tablet (50 mg total) by mouth daily., Disp: 90 tablet, Rfl: 3   sorbitol 70 % SOLN, Take 15-30 mLs by mouth daily as needed for moderate constipation., Disp: 473 mL, Rfl: 11   tamsulosin (FLOMAX) 0.4 MG CAPS capsule, , Disp: , Rfl:    warfarin (COUMADIN) 5 MG tablet, TAKE 1 TABLET BY MOUTH ONCE DAILY AT 6 IN THE EVENING EXCEPT ON Monday TAKE 1/2 TABLET, Disp: 100 tablet, Rfl: 0   zolpidem (AMBIEN) 5 MG tablet, Take 1 tablet (5 mg total) by mouth at bedtime as needed for sleep., Disp: 30 tablet, Rfl: 5 Past Medical History:  Diagnosis Date   Aortic atherosclerosis ([place]) 10/23/2017   Asymptomatic, seen on CT scan   Bilateral cataracts 05/18/2016   Not visually significant   Chronic constipation 06/12/2013   Chronic low back pain 06/12/2013   L4-5 facet arthropathy    Chronic pain of both shoulders 12/07/2013   A/C joint osteoarthritis    Cluster headaches    Resolved in 2006   COPD (chronic obstructive pulmonary disease) ([place])  Dry eye syndrome, bilateral 73/22/0254   Embolic stroke involving right middle cerebral artery ([place]) 08/08/2004   Occured after traveling from [place] to [place] where he was hospitalized for 1 month with no further work-up or therapy.  Returned to [place] unable to walk and was admitted to [facility] immediately after landing. Presumed embolic per neuro but TEE, bubble study, and hypercoag W/U negative. On indefinite coumadin per patient's informed preference.  Residual effects : Left spastic hemiplegia. Tx with phenol tibi   Generalized anxiety disorder 08/03/2018   History of kidney stones    Hyperplastic colon polyp 07/27/2012   Excised  endoscopically 07/27/2012   Hypertensive retinopathy of both eyes 05/18/2016   Internal and external hemorrhoids without complication 2/70/6237   Seen on colonoscopy 04/03/2007.   Left nephrolithiasis 08/31/2015   With mild left hydronephrosis.  Scheduled to undergo shockwave lithotripsy.   Obesity (BMI 30.0-34.9) 12/07/2013   Osteoarthritis of both knees 01/26/2012   Positive PPD 12/07/2013   Pulmonary embolism ([place]) 09/30/2004   Diagnosed in April 2006 after returning from [place].  Likely provoked as it occurred after a 12 hour plane flight within 2 months of R MCA CVA with left hemiparesis. Patient has made an informed decision to remain on lifelong warfarin.    Tubular adenoma 12/19/2017   Endoscopically excised 04/03/2007.   Urge incontinence of urine 08/31/2015   Possibly secondary to prior CVA.  Treated with Myrbetriq.   Social History   Socioeconomic History   Marital status: Divorced    Spouse name: Not on file   Number of children: Not on file   Years of education: Not on file   Highest education level: Not on file  Occupational History   Not on file  Tobacco Use   Smoking status: Former Smoker    Types: Cigarettes    Quit date: 06/28/2007    Years since quitting: 12.7   Smokeless tobacco: Never Used  Vaping Use   Vaping Use: Never used  Substance and Sexual Activity   Alcohol use: Yes    Alcohol/week: 0.0 standard drinks    Comment: Rarely.   Drug use: No   Sexual activity: Not on file  Other Topics Concern   Not on file  Social History Narrative   Born in [place] but has been in the [place] for > 30 years.  Divorced, 1 daughter and 2 sons.  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   Social Determinants of Health   Financial Resource Strain:    Difficulty of Paying Living Expenses: Not on file  Food Insecurity:    Worried About Charity fundraiser in the Last Year: Not on file   [employer] of Food in the Last  Year: Not on file  Transportation Needs:    Lack of Transportation (Medical): Not on file   Lack of Transportation (Non-Medical): Not on file  Physical Activity:    Days of Exercise per Week: Not on file   Minutes of Exercise per Session: Not on file  Stress:    Feeling of Stress : Not on file  Social Connections:    Frequency of Communication with Friends and Family: Not on file   Frequency of Social Gatherings with Friends and Family: Not on file   Attends Religious Services: Not on file   Active Member of Clubs or Organizations: Not on file   Attends Archivist Meetings: Not on file   Marital Status: Not  on file   Family History  Problem Relation Age of Onset   Stroke Maternal Grandmother    Unexplained death Mother    Depression Mother        After husband's death   Unexplained death Father    Osteoarthritis Father        Knees   Early death Sister    Seizures Sister    Early death Brother 70       Unknown cause   Healthy Daughter    Healthy Son    Stroke Maternal Aunt    Healthy Sister    Healthy Sister    Unexplained death Brother    Healthy Brother    Healthy Son     ASSESSMENT Recent Results: The most recent result is correlated with 32.5mg  per week: Lab Results  Component Value Date   INR 4.0 (A) 03/09/2020   INR 3.6 (A) 02/03/2020   INR 1.9 (A) 12/02/2019    Anticoagulation Dosing: Description   Take one (1) tablet of your 5mg  peach-colored warfarin tablets by mouth, once-daily except on Mondays and Thursdays take 1/2 tablet at 6PM.         INR today: Supratherapeutic  PLAN Weekly dose was decreased by 10% to 30mg  per week  Patient Instructions  Patient instructed to take medications as defined in the Anti-coagulation Track section of this encounter.  Patient instructed to take today's dose.  Patient instructed to take one (1) tablet of your 5mg  peach-colored warfarin tablets by mouth, once-daily except on  Mondays and Thursdays take 1/2 tablet at [facility].   Patient verbalized understanding of these instructions.    Patient advised to contact clinic or seek medical attention if signs/symptoms of bleeding or thromboembolism occur.  Patient verbalized understanding by repeating back information and was advised to contact me if further medication-related questions arise. Patient was also provided an information handout.  Follow-up Return in 3 weeks (on 03/30/2020) for Follow up INR.  [name], PharmD, [place], PharmD, Pharmacy Resident  15 minutes spent face-to-face with the patient during the encounter. 50% of time spent on education, including signs/sx bleeding and clotting, as well as food and drug interactions with warfarin. 50% of time was spent on fingerprick POC INR sample collection,processing, results determination, and documentation in [email or web address]/.

## 2020-03-10 NOTE — Progress Notes (Signed)
INTERNAL MEDICINE TEACHING ATTENDING ADDENDUM  I agree with pharmacy recommendations as outlined in their note.   Alexander Morgan N Ganon Demasi, MD  

## 2020-03-27 ENCOUNTER — Ambulatory Visit (INDEPENDENT_AMBULATORY_CARE_PROVIDER_SITE_OTHER): Payer: Medicare HMO | Admitting: Neurology

## 2020-03-27 ENCOUNTER — Encounter: Payer: Self-pay | Admitting: Neurology

## 2020-03-27 VITALS — BP 132/82 | HR 66 | Ht 72.0 in | Wt 237.5 lb

## 2020-03-27 DIAGNOSIS — I639 Cerebral infarction, unspecified: Secondary | ICD-10-CM

## 2020-03-27 DIAGNOSIS — G8110 Spastic hemiplegia affecting unspecified side: Secondary | ICD-10-CM

## 2020-03-27 DIAGNOSIS — G3184 Mild cognitive impairment, so stated: Secondary | ICD-10-CM

## 2020-03-27 DIAGNOSIS — R251 Tremor, unspecified: Secondary | ICD-10-CM

## 2020-03-27 DIAGNOSIS — E538 Deficiency of other specified B group vitamins: Secondary | ICD-10-CM | POA: Diagnosis not present

## 2020-03-27 DIAGNOSIS — R799 Abnormal finding of blood chemistry, unspecified: Secondary | ICD-10-CM | POA: Diagnosis not present

## 2020-03-27 HISTORY — DX: Cerebral infarction, unspecified: I63.9

## 2020-03-27 NOTE — Progress Notes (Signed)
Chief Complaint  Patient presents with  . New Patient (Initial Visit)    RM EMG 4, alone. Internal referral from Gilles Chiquito, MD for tremor in right hand. Reports tremor started about 6 or more months ago. Notices tremor the most when trying to hold cup of coffee.   . Gait Problem    Ambulates with cane. No falls in last year. Loses balance often. Wears AFO brace on left foot (has hx stroke)  . PCP    Joni Reining, DO    HISTORICAL  Alexander English is a 64 year old male, seen in request by his primary care doctor Sid Falcon, MD For evaluation of right hand tremor, history of stroke, mild cognitive impairment, initial evaluation was March 28, 2019.  I reviewed and summarized the referring note.  Past medical history Hyperlipidemia History of stroke in February 2006 Pulmonary emboli, on chronic Coumadin treatment  He suffered stroke in February 2006 while visiting his family in [place], presented with left arm and leg weakness, slurred speech, went to rehabilitation for 1 month, continued his care at [place] afterwards,  We were able to review MRI of the brain in April 2006, acute to subacute infarction affecting the right insular, deep white matter of the corona radiata, and the parietal cortex, some area within the stroke had blood product accumulation  MRA of the brain showed decreased flow in the right MCA territory  CT angiogram in April 2006 showed large pulmonary emboli in, mainly to the right middle and lower lobe with segmental involvement of the left lower lobe  He was treated with warfarin since, also take aspirin, he now ambulate with left ankle and knee brace, cane,  Since March 2021, he noticed intermittent right hand tremor, noticed tremor when he holding a pen, but there was no resting tremor, he also complains of mild cognitive impairment, went into a room, would forgot why he is there  REVIEW OF SYSTEMS: Full 14 system review of systems performed  and notable only for as above All other review of systems were negative.  ALLERGIES: No Known Allergies  HOME MEDICATIONS: Current Outpatient Medications  Medication Sig Dispense Refill  . aspirin EC 81 MG tablet Take 81 mg by mouth daily.    Marland Kitchen atorvastatin (LIPITOR) 10 MG tablet Take 1 tablet (10 mg total) by mouth daily. 90 tablet 3  . fesoterodine (TOVIAZ) 8 MG TB24 tablet Take 1 tablet (8 mg total) by mouth daily. 90 tablet 3  . Multiple Vitamin (MULTIVITAMIN) tablet Take 1 tablet by mouth daily.    Marland Kitchen MYRBETRIQ 25 MG TB24 tablet Take 25 mg by mouth daily.    Marland Kitchen oxyCODONE-acetaminophen (PERCOCET/ROXICET) 5-325 MG tablet Take 1 tablet by mouth every 12 (twelve) hours as needed for severe pain. 60 tablet 0  . propranolol ER (INDERAL LA) 60 MG 24 hr capsule Take 1 capsule (60 mg total) by mouth daily. 30 capsule 11  . sertraline (ZOLOFT) 50 MG tablet Take 1 tablet (50 mg total) by mouth daily. 90 tablet 3  . tamsulosin (FLOMAX) 0.4 MG CAPS capsule     . warfarin (COUMADIN) 5 MG tablet TAKE 1 TABLET BY MOUTH ONCE DAILY AT 6 IN THE EVENING EXCEPT ON Monday TAKE 1/2 TABLET 100 tablet 0   No current facility-administered medications for this visit.    PAST MEDICAL HISTORY: Past Medical History:  Diagnosis Date  . Aortic atherosclerosis ([place]) 10/23/2017   Asymptomatic, seen on CT scan  . Bilateral cataracts 05/18/2016  Not visually significant  . Chronic constipation 06/12/2013  . Chronic low back pain 06/12/2013   L4-5 facet arthropathy   . Chronic pain of both shoulders 12/07/2013   A/C joint osteoarthritis   . Cluster headaches    Resolved in 2006  . COPD (chronic obstructive pulmonary disease) ([place])   . Dry eye syndrome, bilateral 05/18/2016  . Embolic stroke involving right middle cerebral artery ([place]) 08/08/2004   Occured after traveling from [place] to [place] where he was hospitalized for 1 month with no further work-up or therapy.  Returned to [place] unable to walk and was admitted to  [facility], [facility], [facility] immediately after landing. Presumed embolic per neuro but TEE, bubble study, and hypercoag W/U negative. On indefinite coumadin per patient's informed preference.  Residual effects : Left spastic hemiplegia. Tx with phenol tibi  . Generalized anxiety disorder 08/03/2018  . History of kidney stones   . Hyperplastic colon polyp 07/27/2012   Excised endoscopically 07/27/2012  . Hypertensive retinopathy of both eyes 05/18/2016  . Internal and external hemorrhoids without complication 9/79/8921   Seen on colonoscopy 04/03/2007.  Marland Kitchen Left nephrolithiasis 08/31/2015   With mild left hydronephrosis.  Scheduled to undergo shockwave lithotripsy.  . Obesity (BMI 30.0-34.9) 12/07/2013  . Osteoarthritis of both knees 01/26/2012  . Positive PPD 12/07/2013  . Pulmonary embolism ([place]) 09/30/2004   Diagnosed in April 2006 after returning from [place].  Likely provoked as it occurred after a 12 hour plane flight within 2 months of R MCA CVA with left hemiparesis. Patient has made an informed decision to remain on lifelong warfarin.   . Tubular adenoma 12/19/2017   Endoscopically excised 04/03/2007.  Marland Kitchen Urge incontinence of urine 08/31/2015   Possibly secondary to prior CVA.  Treated with Myrbetriq.    PAST SURGICAL HISTORY: Past Surgical History:  Procedure Laterality Date  . EXCISION NASAL MASS Left 10/06/2017   Procedure: EXCISION LEFT NASAL MASS;  Surgeon: [name], MD;  Location: [place];  Service: ENT;  Laterality: Left;  Removal of intvering papilloma frozen section  . FOOT SURGERY Bilateral    Bunion  . I & D EXTREMITY Left 11/28/2001   Left thumb thenar space abscess.  Marland Kitchen LITHOTRIPSY  2017  . MAXILLECTOMY Left 11/22/2017   [name] 11/22/2017  . MAXILLECTOMY Left 11/22/2017   Procedure: MAXILLECTOMY;  Surgeon: [name], MD;  Location: [place];  Service: ENT;  Laterality: Left;  . NASAL SINUS SURGERY Left 10/06/2017   Procedure: ENDOSCOPIC SINUS SURGERY;  Surgeon: [name], MD;  Location: [place];  Service:  ENT;  Laterality: Left;  Left side Endoscopic Macillary and Ethmoid Sinus  . SKIN SPLIT GRAFT Left 11/22/2017   Procedure: SKIN GRAFT SPLIT THICKNESS;  Surgeon: [name], MD;  Location: [facility] OR;  Service: ENT;  Laterality: Left;    FAMILY HISTORY: Family History  Problem Relation Age of Onset  . Stroke Maternal Grandmother   . Unexplained death Mother   . Depression Mother        After husband's death  . Unexplained death Father   . Osteoarthritis Father        Knees  . Early death Sister   . Seizures Sister   . Early death Brother 60       Unknown cause  . Healthy Daughter   . Healthy Son   . Stroke Maternal Aunt   . Healthy Sister   . Healthy Sister   . Unexplained death Brother   . Healthy Brother   . Healthy Son  SOCIAL HISTORY: Social History   Socioeconomic History  . Marital status: Single    Spouse name: Not on file  . Number of children: 3  . Years of education: 10  . Highest education level: Not on file  Occupational History  . Occupation: Retired  Tobacco Use  . Smoking status: Former Smoker    Types: Cigarettes    Quit date: 06/28/2007    Years since quitting: 12.7  . Smokeless tobacco: Never Used  Vaping Use  . Vaping Use: Never used  Substance and Sexual Activity  . Alcohol use: Yes    Alcohol/week: 0.0 standard drinks    Comment: Rarely.  . Drug use: No  . Sexual activity: Not on file  Other Topics Concern  . Not on file  Social History Narrative   Born in [place] but has been in the [place] for > 30 years.  Divorced, 1 daughter and 2 sons.  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   Right handed   Coffee/tea sometimes, soda rarely   Social Determinants of Health   Financial Resource Strain:   . Difficulty of Paying Living Expenses: Not on file  Food Insecurity:   . Worried About Charity fundraiser in the Last Year: Not on file  . Ran Out of Food in the Last Year: Not on file  Transportation  Needs:   . Lack of Transportation (Medical): Not on file  . Lack of Transportation (Non-Medical): Not on file  Physical Activity:   . Days of Exercise per Week: Not on file  . Minutes of Exercise per Session: Not on file  Stress:   . Feeling of Stress : Not on file  Social Connections:   . Frequency of Communication with Friends and Family: Not on file  . Frequency of Social Gatherings with Friends and Family: Not on file  . Attends Religious Services: Not on file  . Active Member of Clubs or Organizations: Not on file  . Attends Archivist Meetings: Not on file  . Marital Status: Not on file  Intimate Partner Violence:   . Fear of Current or Ex-Partner: Not on file  . Emotionally Abused: Not on file  . Physically Abused: Not on file  . Sexually Abused: Not on file     PHYSICAL EXAM   Vitals:   03/27/20 1054  BP: 132/82  Pulse: 66  Weight: 237 lb 8 oz (107.7 kg)  Height: 6' (1.829 m)   Not recorded     Body mass index is 32.21 kg/m.  PHYSICAL EXAMNIATION:  Gen: NAD, conversant, well nourised, well groomed                     Cardiovascular: Regular rate rhythm, no peripheral edema, warm, nontender. Eyes: Conjunctivae clear without exudates or hemorrhage Neck: Supple, no carotid bruits. Pulmonary: Clear to auscultation bilaterally   NEUROLOGICAL EXAM:  MENTAL STATUS: Speech/cognition: MMSE - Mini Mental State Exam 03/27/2020  Orientation to time 5  Orientation to Place 5  Registration 3  Attention/ Calculation 5  Recall 2  Language- name 2 objects 2  Language- repeat 1  Language- follow 3 step command 3  Language- read & follow direction 1  Write a sentence 1  Copy design 1  Total score 29   CRANIAL NERVES: CN II: Visual fields are full to confrontation. Pupils are round equal and briskly reactive to light. CN III, IV, VI: extraocular  movement are normal. No ptosis. CN V: Facial sensation is intact to light touch CN VII: Face is symmetric  with normal eye closure  CN VIII: Hearing is normal to causal conversation. CN IX, X: Phonation is normal. CN XI: Head turning and shoulder shrug are intact  MOTOR: Spastic left hemiparesis left upper extremity has antigravity movement of proximal and distal muscles, wear left AFO, cross left ankle and left knee  Right upper extremity normal muscle tone, strength, no rigidity, no bradykinesia,  REFLEXES: Hyperreflexia at left upper and lower extremity.  SENSORY: Intact to light touch, pinprick and vibratory sensation are intact in fingers and toes.  COORDINATION: There is no trunk or limb dysmetria noted.  GAIT/STANCE: He needs push-up to get up from seated position, rely on his cane   DIAGNOSTIC DATA (LABS, IMAGING, TESTING) - I reviewed patient records, labs, notes, testing and imaging myself where available.   ASSESSMENT AND PLAN  RAHM MINIX is a 64 y.o. male   Right MCA stroke in 2006, with residual spastic left hemiparesis New onset right hand tremor Mild cognitive impairment  Differentiation diagnosis exaggerated physiological tremor,  He has no significant right hand bradykinesia, rigidity,  Check thyroid functional test also B12,  MRI of brain   Marcial Pacas, M.D. Ph.D.  [facility] [address], [place], [place] [address] Ph: [phone] Fax: [phone]  CC:  Sid Falcon, MD [place],  [place] [address] [name], DO

## 2020-03-28 LAB — RPR: RPR Ser Ql: NONREACTIVE

## 2020-03-28 LAB — VITAMIN B12: Vitamin B-12: 1411 pg/mL — ABNORMAL HIGH (ref 232–1245)

## 2020-03-30 ENCOUNTER — Telehealth: Payer: Self-pay | Admitting: Neurology

## 2020-03-30 ENCOUNTER — Ambulatory Visit: Payer: Medicare HMO

## 2020-03-30 ENCOUNTER — Telehealth: Payer: Self-pay | Admitting: Pharmacist

## 2020-03-30 NOTE — Telephone Encounter (Signed)
Called patient and left message:  RESCHEDULING your appointment FROM 4-OCT-21 to 18-OCT-21 at 0930h. Thank you.

## 2020-03-30 NOTE — Telephone Encounter (Signed)
[employer] Josem Kaufmann: [id number] 9exp. 03/30/20 to 04/29/20) order sent to GI. They will reach out to the patient to schedule.

## 2020-04-06 ENCOUNTER — Ambulatory Visit: Payer: Medicare HMO

## 2020-04-13 ENCOUNTER — Other Ambulatory Visit: Payer: Self-pay

## 2020-04-13 ENCOUNTER — Ambulatory Visit (INDEPENDENT_AMBULATORY_CARE_PROVIDER_SITE_OTHER): Payer: Medicare HMO | Admitting: Pharmacist

## 2020-04-13 DIAGNOSIS — Z86718 Personal history of other venous thrombosis and embolism: Secondary | ICD-10-CM | POA: Diagnosis not present

## 2020-04-13 DIAGNOSIS — Z7901 Long term (current) use of anticoagulants: Secondary | ICD-10-CM | POA: Diagnosis not present

## 2020-04-13 LAB — POCT INR: INR: 2.9 (ref 2.0–3.0)

## 2020-04-13 NOTE — Patient Instructions (Signed)
Patient instructed to take medications as defined in the Anti-coagulation Track section of this encounter.  Patient instructed to take today's dose.  Patient instructed to take one (1) tablet of your 5mg  peach-colored warfarin tablets by mouth, once-daily except on Mondays and Thursdays take 1/2 tablet at [facility].   Patient verbalized understanding of these instructions.

## 2020-04-13 NOTE — Progress Notes (Signed)
Anticoagulation Management Alexander English is a 64 y.o. male who reports to the clinic for monitoring of warfarin treatment.    Indication: DVT, History of; Long term current use of anticoagulant warfarin to INR 2.0 - 3.0.  Duration: indefinite Supervising physician: Aldine Contes  Anticoagulation Clinic Visit History: Patient does not report signs/symptoms of bleeding or thromboembolism Other recent changes: No diet, medications, lifestyle changes endorsed by the patient at this visit.  Anticoagulation Episode Summary    Current INR goal:  2.0-3.0  TTR:  80.4 % (8 y)  Next INR check:  05/11/2020  INR from last check:  2.9 (04/13/2020)  Weekly max warfarin dose:    Target end date:  Indefinite  INR check location:  Anticoagulation Clinic  Preferred lab:    Send INR reminders to:  [facility]   Indications   History of venous thromboembolism [Z86.718]       Comments:          No Known Allergies  Current Outpatient Medications:  .  aspirin EC 81 MG tablet, Take 81 mg by mouth daily., Disp: , Rfl:  .  atorvastatin (LIPITOR) 10 MG tablet, Take 1 tablet (10 mg total) by mouth daily., Disp: 90 tablet, Rfl: 3 .  fesoterodine (TOVIAZ) 8 MG TB24 tablet, Take 1 tablet (8 mg total) by mouth daily., Disp: 90 tablet, Rfl: 3 .  Multiple Vitamin (MULTIVITAMIN) tablet, Take 1 tablet by mouth daily., Disp: , Rfl:  .  MYRBETRIQ 25 MG TB24 tablet, Take 25 mg by mouth daily., Disp: , Rfl:  .  oxyCODONE-acetaminophen (PERCOCET/ROXICET) 5-325 MG tablet, Take 1 tablet by mouth every 12 (twelve) hours as needed for severe pain., Disp: 60 tablet, Rfl: 0 .  propranolol ER (INDERAL LA) 60 MG 24 hr capsule, Take 1 capsule (60 mg total) by mouth daily., Disp: 30 capsule, Rfl: 11 .  sertraline (ZOLOFT) 50 MG tablet, Take 1 tablet (50 mg total) by mouth daily., Disp: 90 tablet, Rfl: 3 .  tamsulosin (FLOMAX) 0.4 MG CAPS capsule, , Disp: , Rfl:  .  warfarin (COUMADIN) 5 MG tablet, TAKE 1 TABLET BY  MOUTH ONCE DAILY AT 6 IN THE EVENING EXCEPT ON Monday TAKE 1/2 TABLET, Disp: 100 tablet, Rfl: 0 Past Medical History:  Diagnosis Date  . Aortic atherosclerosis ([place]) 10/23/2017   Asymptomatic, seen on CT scan  . Bilateral cataracts 05/18/2016   Not visually significant  . Chronic constipation 06/12/2013  . Chronic low back pain 06/12/2013   L4-5 facet arthropathy   . Chronic pain of both shoulders 12/07/2013   A/C joint osteoarthritis   . Cluster headaches    Resolved in 2006  . COPD (chronic obstructive pulmonary disease) ([place])   . Dry eye syndrome, bilateral 05/18/2016  . Embolic stroke involving right middle cerebral artery ([place]) 08/08/2004   Occured after traveling from [place] to [place] where he was hospitalized for 1 month with no further work-up or therapy.  Returned to [place] unable to walk and was admitted to [facility] immediately after landing. Presumed embolic per neuro but TEE, bubble study, and hypercoag W/U negative. On indefinite coumadin per patient's informed preference.  Residual effects : Left spastic hemiplegia. Tx with phenol tibi  . Generalized anxiety disorder 08/03/2018  . History of kidney stones   . Hyperplastic colon polyp 07/27/2012   Excised endoscopically 07/27/2012  . Hypertensive retinopathy of both eyes 05/18/2016  . Internal and external hemorrhoids without complication 6/96/2952   Seen on colonoscopy 04/03/2007.  Marland Kitchen Left nephrolithiasis  08/31/2015   With mild left hydronephrosis.  Scheduled to undergo shockwave lithotripsy.  . Obesity (BMI 30.0-34.9) 12/07/2013  . Osteoarthritis of both knees 01/26/2012  . Positive PPD 12/07/2013  . Pulmonary embolism ([place]) 09/30/2004   Diagnosed in April 2006 after returning from [place].  Likely provoked as it occurred after a 12 hour plane flight within 2 months of R MCA CVA with left hemiparesis. Patient has made an informed decision to remain on lifelong warfarin.   . Tubular adenoma 12/19/2017   Endoscopically excised 04/03/2007.  Marland Kitchen Urge  incontinence of urine 08/31/2015   Possibly secondary to prior CVA.  Treated with Myrbetriq.   Social History   Socioeconomic History  . Marital status: Single    Spouse name: Not on file  . Number of children: 3  . Years of education: 71  . Highest education level: Not on file  Occupational History  . Occupation: Retired  Tobacco Use  . Smoking status: Former Smoker    Types: Cigarettes    Quit date: 06/28/2007    Years since quitting: 12.8  . Smokeless tobacco: Never Used  Vaping Use  . Vaping Use: Never used  Substance and Sexual Activity  . Alcohol use: Yes    Alcohol/week: 0.0 standard drinks    Comment: Rarely.  . Drug use: No  . Sexual activity: Not on file  Other Topics Concern  . Not on file  Social History Narrative   Born in [place] but has been in the [place] for > 30 years.  Divorced, 1 daughter and 2 sons.  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   Right handed   Coffee/tea sometimes, soda rarely   Social Determinants of Health   Financial Resource Strain:   . Difficulty of Paying Living Expenses: Not on file  Food Insecurity:   . Worried About Charity fundraiser in the Last Year: Not on file  . Ran Out of Food in the Last Year: Not on file  Transportation Needs:   . Lack of Transportation (Medical): Not on file  . Lack of Transportation (Non-Medical): Not on file  Physical Activity:   . Days of Exercise per Week: Not on file  . Minutes of Exercise per Session: Not on file  Stress:   . Feeling of Stress : Not on file  Social Connections:   . Frequency of Communication with Friends and Family: Not on file  . Frequency of Social Gatherings with Friends and Family: Not on file  . Attends Religious Services: Not on file  . Active Member of Clubs or Organizations: Not on file  . Attends Archivist Meetings: Not on file  . Marital Status: Not on file   Family History  Problem Relation Age of Onset  .  Stroke Maternal Grandmother   . Unexplained death Mother   . Depression Mother        After husband's death  . Unexplained death Father   . Osteoarthritis Father        Knees  . Early death Sister   . Seizures Sister   . Early death Brother 73       Unknown cause  . Healthy Daughter   . Healthy Son   . Stroke Maternal Aunt   . Healthy Sister   . Healthy Sister   . Unexplained death Brother   . Healthy Brother   . Healthy Son     ASSESSMENT Recent Results:  The most recent result is correlated with 303 mg per week: Lab Results  Component Value Date   INR 2.9 04/13/2020   INR 4.0 (A) 03/09/2020   INR 3.6 (A) 02/03/2020    Anticoagulation Dosing: Description   Take one (1) tablet of your 5mg  peach-colored warfarin tablets by mouth, once-daily except on Mondays and Thursdays take 1/2 tablet at 6PM.         INR today: Therapeutic  PLAN Weekly dose was unchanged.   Patient Instructions  Patient instructed to take medications as defined in the Anti-coagulation Track section of this encounter.  Patient instructed to take today's dose.  Patient instructed to take one (1) tablet of your 5mg  peach-colored warfarin tablets by mouth, once-daily except on Mondays and Thursdays take 1/2 tablet at [facility].   Patient verbalized understanding of these instructions.    Patient advised to contact clinic or seek medical attention if signs/symptoms of bleeding or thromboembolism occur.  Patient verbalized understanding by repeating back information and was advised to contact me if further medication-related questions arise. Patient was also provided an information handout.  Follow-up Return in 4 weeks (on 05/11/2020) for Follow up INR.  [name], PharmD, CPP  15 minutes spent face-to-face with the patient during the encounter. 50% of time spent on education, including signs/sx bleeding and clotting, as well as food and drug interactions with warfarin. 50% of time was spent on  fingerprick POC INR sample collection,processing, results determination, and documentation in http://www.kim.net/.

## 2020-04-14 ENCOUNTER — Ambulatory Visit
Admission: RE | Admit: 2020-04-14 | Discharge: 2020-04-14 | Disposition: A | Discharge status: Home / Self Care | Payer: Medicare HMO | Source: Ambulatory Visit | Attending: Neurology | Admitting: Neurology

## 2020-04-14 DIAGNOSIS — G8110 Spastic hemiplegia affecting unspecified side: Secondary | ICD-10-CM

## 2020-04-14 DIAGNOSIS — R251 Tremor, unspecified: Secondary | ICD-10-CM

## 2020-04-14 DIAGNOSIS — G3184 Mild cognitive impairment, so stated: Secondary | ICD-10-CM

## 2020-04-14 DIAGNOSIS — I639 Cerebral infarction, unspecified: Secondary | ICD-10-CM | POA: Diagnosis not present

## 2020-04-15 NOTE — Progress Notes (Signed)
INTERNAL MEDICINE TEACHING ATTENDING ADDENDUM - Mariusz Jubb M.D  Duration- indefinite, Indication- PE, INR- therapeutic. Agree with pharmacy recommendations as outlined in their note.     

## 2020-04-16 ENCOUNTER — Telehealth: Payer: Self-pay | Admitting: Neurology

## 2020-04-16 NOTE — Telephone Encounter (Signed)
Duplicate phone note.

## 2020-04-16 NOTE — Telephone Encounter (Signed)
Pt called to return phone

## 2020-04-16 NOTE — Telephone Encounter (Signed)
Left message on home and cell numbers. Requested a call back.

## 2020-04-16 NOTE — Telephone Encounter (Addendum)
I spoke to the patient and provided him with the MRI brain results. He verbalized understanding of the findings.

## 2020-04-16 NOTE — Telephone Encounter (Signed)
IMPRESSION:   MRI brain (without) demonstrating: - Chronic ischemic infarct in the right parietal and peri-insular region. - Moderate chronic small vessel ischemic disease. - No acute findings.   Please call patient, MRI of the brain showed chronic ischemic infarction involving right parietal and periinsular region, moderate supratentorium small vessel disease, there was no acute abnormality

## 2020-04-25 ENCOUNTER — Other Ambulatory Visit: Payer: Self-pay

## 2020-04-25 ENCOUNTER — Ambulatory Visit: Payer: Medicare HMO | Attending: Internal Medicine

## 2020-04-25 DIAGNOSIS — Z23 Encounter for immunization: Secondary | ICD-10-CM

## 2020-04-25 NOTE — Progress Notes (Signed)
   Covid-19 Vaccination Clinic  Name:  LAZARUS SUDBURY    MRN: [id number] DOB: 1956/06/05  04/25/2020  Mr. Barkett was observed post Covid-19 immunization for 15 minutes without incident. He was provided with Vaccine Information Sheet and instruction to access the V-Safe system.   Mr. Crichlow was instructed to call 911 with any severe reactions post vaccine: Marland Kitchen Difficulty breathing  . Swelling of face and throat  . A fast heartbeat  . A bad rash all over body  . Dizziness and weakness

## 2020-05-05 ENCOUNTER — Other Ambulatory Visit: Payer: Self-pay | Admitting: *Deleted

## 2020-05-05 DIAGNOSIS — Z86711 Personal history of pulmonary embolism: Secondary | ICD-10-CM

## 2020-05-05 MED ORDER — WARFARIN SODIUM 5 MG PO TABS: ORAL_TABLET | ORAL | 0 refills | Status: DC

## 2020-05-07 ENCOUNTER — Other Ambulatory Visit: Payer: Self-pay

## 2020-05-07 ENCOUNTER — Telehealth: Payer: Self-pay | Admitting: Pharmacist

## 2020-05-07 ENCOUNTER — Ambulatory Visit (INDEPENDENT_AMBULATORY_CARE_PROVIDER_SITE_OTHER): Payer: Medicare HMO | Admitting: Internal Medicine

## 2020-05-07 ENCOUNTER — Encounter: Payer: Self-pay | Admitting: Internal Medicine

## 2020-05-07 VITALS — BP 127/72 | HR 59 | Temp 98.3°F | Ht 72.0 in | Wt 237.7 lb

## 2020-05-07 DIAGNOSIS — G252 Other specified forms of tremor: Secondary | ICD-10-CM

## 2020-05-07 DIAGNOSIS — M172 Bilateral post-traumatic osteoarthritis of knee: Secondary | ICD-10-CM

## 2020-05-07 DIAGNOSIS — R079 Chest pain, unspecified: Secondary | ICD-10-CM | POA: Diagnosis not present

## 2020-05-07 DIAGNOSIS — Z86718 Personal history of other venous thrombosis and embolism: Secondary | ICD-10-CM

## 2020-05-07 LAB — POCT INR: INR: 2.4 (ref 2.0–3.0)

## 2020-05-07 MED ORDER — OXYCODONE-ACETAMINOPHEN 5-325 MG PO TABS: 1.0000 | ORAL_TABLET | Freq: Two times a day (BID) | ORAL | 0 refills | Status: DC | PRN

## 2020-05-07 MED ORDER — OMEPRAZOLE 40 MG PO CPDR
40.0000 mg | DELAYED_RELEASE_CAPSULE | Freq: Every day | ORAL | 0 refills | Status: DC
Start: 2020-05-07 — End: 2020-10-15

## 2020-05-07 NOTE — Assessment & Plan Note (Signed)
HPI: He continues to report bilateral knee pain.  Oxycodone acetaminophen 5-3 25 has been helpful to take intermittently with severe pain.  He has not requested refills since I last refilled the medication several months ago.  I do believe he is using this appropriately.  I do not see any red flag behavior.  Assessment osteoarthritis of both knees  Plan Refill oxycodone 5-3 25 1  tablet twice daily as needed #40.

## 2020-05-07 NOTE — Assessment & Plan Note (Signed)
He was referred to neurology who obtained a MRI of his brain this was without any acute or new changes outside of his previous CVA.  He was started on some propranolol which has helped his tremor.  She may continue propanolol as long as it is helping.

## 2020-05-07 NOTE — Assessment & Plan Note (Signed)
HPI: He reports to me today that he is having some intermittent short burst of chest pain.  Pain is nonexertional.  Pain is midline.  Pain is worsened after spicy food.  Water improves pain.  Pain lasts between 1 and 3 minutes.  Admits to occasional reflux but feels it is normal amount.  Wondering if he should see cardiology.  Assessment chest pain suspected due to heartburn  Plan I discussed with him I do not see any concerning symptoms for cardiac chest pain, pain appears to be noncardiac in nature would recommend trial of PPI to see if this resolves his pain.  He will let me know if he has any worsening symptoms. Omeprazole 40 mg daily for 4 to 6 weeks

## 2020-05-07 NOTE — Telephone Encounter (Signed)
Was provided results of FS POC INR 2.4 by Maryan Rued in our [facility] today. INR correlates to a total weekly dose of 30mg  warfarin. Contacted the patient and advised to CONTINUE SAME regimen. RTC 6-DEC-21 0915 for repeat INR. Patient saw Dr. Heber Estero today in the clinic for regularly scheduled appointment.

## 2020-05-07 NOTE — Assessment & Plan Note (Signed)
Repeat INR today is at goal no change to Coumadin dosing.

## 2020-05-07 NOTE — Progress Notes (Signed)
  Subjective:  HPI: [name] is a 64 y.o. male who presents for f/u Hx CVA, Chronic pain, Chest pain  Please see Assessment and Plan below for the status of his chronic medical problems.  Objective:  Physical Exam: Vitals:   05/07/20 0913  BP: 127/72  Pulse: (!) 59  Temp: 98.3 F (36.8 C)  TempSrc: Oral  SpO2: 100%  Weight: 237 lb 11.2 oz (107.8 kg)  Height: 6' (1.829 m)   Body mass index is 32.24 kg/m. Physical Exam Vitals and nursing note reviewed.  Constitutional:      Appearance: Normal appearance.     Comments: In transport wheelchair  Cardiovascular:     Rate and Rhythm: Normal rate and regular rhythm.  Pulmonary:     Effort: Pulmonary effort is normal.     Breath sounds: Normal breath sounds.  Neurological:     Mental Status: He is alert. Mental status is at baseline.     Comments: No tremor appreciated during encounter    Assessment & Plan:  See Encounters Tab for problem based charting.  Medications Ordered Meds ordered this encounter  Medications  . omeprazole (PRILOSEC) 40 MG capsule    Sig: Take 1 capsule (40 mg total) by mouth daily.    Dispense:  90 capsule    Refill:  0  . oxyCODONE-acetaminophen (PERCOCET/ROXICET) 5-325 MG tablet    Sig: Take 1 tablet by mouth every 12 (twelve) hours as needed for severe pain.    Dispense:  60 tablet    Refill:  0    Rx 1/1 chronic pain   Other Orders Orders Placed This Encounter  Procedures  . POCT INR   Follow Up: Return in about 6 months (around 11/04/2020).

## 2020-05-11 ENCOUNTER — Ambulatory Visit: Payer: Medicare HMO

## 2020-05-28 DIAGNOSIS — R3912 Poor urinary stream: Secondary | ICD-10-CM | POA: Diagnosis not present

## 2020-05-28 DIAGNOSIS — N401 Enlarged prostate with lower urinary tract symptoms: Secondary | ICD-10-CM | POA: Diagnosis not present

## 2020-05-28 DIAGNOSIS — R350 Frequency of micturition: Secondary | ICD-10-CM | POA: Diagnosis not present

## 2020-05-28 DIAGNOSIS — R3915 Urgency of urination: Secondary | ICD-10-CM | POA: Diagnosis not present

## 2020-05-28 DIAGNOSIS — R972 Elevated prostate specific antigen [PSA]: Secondary | ICD-10-CM | POA: Diagnosis not present

## 2020-06-01 ENCOUNTER — Ambulatory Visit (INDEPENDENT_AMBULATORY_CARE_PROVIDER_SITE_OTHER): Payer: Medicare HMO | Admitting: Pharmacist

## 2020-06-01 DIAGNOSIS — Z86718 Personal history of other venous thrombosis and embolism: Secondary | ICD-10-CM

## 2020-06-01 DIAGNOSIS — Z7901 Long term (current) use of anticoagulants: Secondary | ICD-10-CM | POA: Diagnosis not present

## 2020-06-01 LAB — POCT INR: INR: 3 (ref 2.0–3.0)

## 2020-06-01 NOTE — Progress Notes (Signed)
Anticoagulation Management Alexander English is a 64 y.o. male who reports to the clinic for monitoring of warfarin treatment.    Indication: DVT, History of (resolved); Long term current use of anticoagulant warfarin with targeted INR 2.0 - 3.0.  Duration: indefinite Supervising physician: Aldine Contes  Anticoagulation Clinic Visit History: Patient does not report signs/symptoms of bleeding or thromboembolism  Other recent changes:No diet, medications, lifestyle changes endorsed by the patient at this visit.  Anticoagulation Episode Summary    Current INR goal:  2.0-3.0  TTR:  80.7 % (8.2 y)  Next INR check:  06/29/2020  INR from last check:  3.0 (06/01/2020)  Weekly max warfarin dose:    Target end date:  Indefinite  INR check location:  Anticoagulation Clinic  Preferred lab:    Send INR reminders to:  [facility]   Indications   History of venous thromboembolism [Z86.718]       Comments:          No Known Allergies  Current Outpatient Medications:  .  aspirin EC 81 MG tablet, Take 81 mg by mouth daily., Disp: , Rfl:  .  atorvastatin (LIPITOR) 10 MG tablet, Take 1 tablet (10 mg total) by mouth daily., Disp: 90 tablet, Rfl: 3 .  fesoterodine (TOVIAZ) 8 MG TB24 tablet, Take 1 tablet (8 mg total) by mouth daily., Disp: 90 tablet, Rfl: 3 .  Multiple Vitamin (MULTIVITAMIN) tablet, Take 1 tablet by mouth daily., Disp: , Rfl:  .  MYRBETRIQ 25 MG TB24 tablet, Take 25 mg by mouth daily., Disp: , Rfl:  .  omeprazole (PRILOSEC) 40 MG capsule, Take 1 capsule (40 mg total) by mouth daily., Disp: 90 capsule, Rfl: 0 .  oxyCODONE-acetaminophen (PERCOCET/ROXICET) 5-325 MG tablet, Take 1 tablet by mouth every 12 (twelve) hours as needed for severe pain., Disp: 60 tablet, Rfl: 0 .  propranolol ER (INDERAL LA) 60 MG 24 hr capsule, Take 1 capsule (60 mg total) by mouth daily., Disp: 30 capsule, Rfl: 11 .  sertraline (ZOLOFT) 50 MG tablet, Take 1 tablet (50 mg total) by mouth daily., Disp:  90 tablet, Rfl: 3 .  tamsulosin (FLOMAX) 0.4 MG CAPS capsule, , Disp: , Rfl:  .  warfarin (COUMADIN) 5 MG tablet, TAKE 1 TABLET BY MOUTH ONCE DAILY AT 6 IN THE EVENING EXCEPT ON Monday and Thursday TAKE 1/2 TABLET, Disp: 100 tablet, Rfl: 0 Past Medical History:  Diagnosis Date  . Aortic atherosclerosis ([place]) 10/23/2017   Asymptomatic, seen on CT scan  . Bilateral cataracts 05/18/2016   Not visually significant  . Cerebrovascular accident (CVA) ([place]) 03/27/2020  . Chronic constipation 06/12/2013  . Chronic low back pain 06/12/2013   L4-5 facet arthropathy   . Chronic pain of both shoulders 12/07/2013   A/C joint osteoarthritis   . Cluster headaches    Resolved in 2006  . COPD (chronic obstructive pulmonary disease) ([place])   . Dry eye syndrome, bilateral 05/18/2016  . Embolic stroke involving right middle cerebral artery ([place]) 08/08/2004   Occured after traveling from [place] to [place] where he was hospitalized for 1 month with no further work-up or therapy.  Returned to [place] unable to walk and was admitted to [facility] immediately after landing. Presumed embolic per neuro but TEE, bubble study, and hypercoag W/U negative. On indefinite coumadin per patient's informed preference.  Residual effects : Left spastic hemiplegia. Tx with phenol tibi  . Generalized anxiety disorder 08/03/2018  . History of kidney stones   . Hyperplastic colon polyp 07/27/2012  Excised endoscopically 07/27/2012  . Hypertensive retinopathy of both eyes 05/18/2016  . Internal and external hemorrhoids without complication 5/62/5638   Seen on colonoscopy 04/03/2007.  Marland Kitchen Left nephrolithiasis 08/31/2015   With mild left hydronephrosis.  Scheduled to undergo shockwave lithotripsy.  . Obesity (BMI 30.0-34.9) 12/07/2013  . Osteoarthritis of both knees 01/26/2012  . Positive PPD 12/07/2013  . Pulmonary embolism ([place]) 09/30/2004   Diagnosed in April 2006 after returning from [place].  Likely provoked as it occurred after a 12 hour plane flight  within 2 months of R MCA CVA with left hemiparesis. Patient has made an informed decision to remain on lifelong warfarin.   . Tubular adenoma 12/19/2017   Endoscopically excised 04/03/2007.  Marland Kitchen Urge incontinence of urine 08/31/2015   Possibly secondary to prior CVA.  Treated with Myrbetriq.   Social History   Socioeconomic History  . Marital status: Single    Spouse name: Not on file  . Number of children: 3  . Years of education: 27  . Highest education level: Not on file  Occupational History  . Occupation: Retired  Tobacco Use  . Smoking status: Former Smoker    Types: Cigarettes    Quit date: 06/28/2007    Years since quitting: 12.9  . Smokeless tobacco: Never Used  Vaping Use  . Vaping Use: Never used  Substance and Sexual Activity  . Alcohol use: Yes    Alcohol/week: 0.0 standard drinks    Comment: Rarely.  . Drug use: No  . Sexual activity: Not on file  Other Topics Concern  . Not on file  Social History Narrative   Born in [place] but has been in the [place] for > 30 years.  Divorced, 1 daughter and 2 sons.  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   Right handed   Coffee/tea sometimes, soda rarely   Social Determinants of Health   Financial Resource Strain:   . Difficulty of Paying Living Expenses: Not on file  Food Insecurity:   . Worried About Charity fundraiser in the Last Year: Not on file  . Ran Out of Food in the Last Year: Not on file  Transportation Needs:   . Lack of Transportation (Medical): Not on file  . Lack of Transportation (Non-Medical): Not on file  Physical Activity:   . Days of Exercise per Week: Not on file  . Minutes of Exercise per Session: Not on file  Stress:   . Feeling of Stress : Not on file  Social Connections:   . Frequency of Communication with Friends and Family: Not on file  . Frequency of Social Gatherings with Friends and Family: Not on file  . Attends Religious Services: Not on file   . Active Member of Clubs or Organizations: Not on file  . Attends Archivist Meetings: Not on file  . Marital Status: Not on file   Family History  Problem Relation Age of Onset  . Stroke Maternal Grandmother   . Unexplained death Mother   . Depression Mother        After husband's death  . Unexplained death Father   . Osteoarthritis Father        Knees  . Early death Sister   . Seizures Sister   . Early death Brother 102       Unknown cause  . Healthy Daughter   . Healthy Son   . Stroke Maternal Aunt   .  Healthy Sister   . Healthy Sister   . Unexplained death Brother   . Healthy Brother   . Healthy Son     ASSESSMENT Recent Results: The most recent result is correlated with 30 mg per week: Lab Results  Component Value Date   INR 3.0 06/01/2020   INR 2.4 05/07/2020   INR 2.9 04/13/2020    Anticoagulation Dosing: Description   Take one (1) tablet of your 5mg  peach-colored warfarin tablets by mouth, once-daily except on Mondays, Wednesdays and Fridays,  take 1/2 tablet at [facility].         INR today: Therapeutic  PLAN Weekly dose was decreased by 8% to 27.5 mg per week  Patient Instructions  Patient instructed to take medications as defined in the Anti-coagulation Track section of this encounter.  Patient instructed to take today's dose.  Patient instructed to take one (1) tablet of your 5mg  peach-colored warfarin tablets by mouth, once-daily except on Mondays, Wednesdays and Fridays,  take 1/2 tablet at [facility].   Patient verbalized understanding of these instructions.    Patient advised to contact clinic or seek medical attention if signs/symptoms of bleeding or thromboembolism occur.  Patient verbalized understanding by repeating back information and was advised to contact me if further medication-related questions arise. Patient was also provided an information handout.  Follow-up Return in 4 weeks (on 06/29/2020) for Follow up INR.  [name],  PharmD, CPP  15 minutes spent face-to-face with the patient during the encounter. 50% of time spent on education, including signs/sx bleeding and clotting, as well as food and drug interactions with warfarin. 50% of time was spent on fingerprick POC INR sample collection,processing, results determination, and documentation in [email or web address].kim.net/.

## 2020-06-01 NOTE — Progress Notes (Signed)
INTERNAL MEDICINE TEACHING ATTENDING ADDENDUM - Aldine Contes M.D  Duration- indefinite, Indication- VTE, INR- therapeutic. Agree with pharmacy recommendations as outlined in their note.

## 2020-06-01 NOTE — Patient Instructions (Signed)
Patient instructed to take medications as defined in the Anti-coagulation Track section of this encounter.  Patient instructed to take today's dose.  Patient instructed to take one (1) tablet of your 5mg peach-colored warfarin tablets by mouth, once-daily except on Mondays, Wednesdays and Fridays,  take 1/2 tablet at 6PM.   Patient verbalized understanding of these instructions.    

## 2020-06-02 ENCOUNTER — Other Ambulatory Visit: Payer: Self-pay | Admitting: *Deleted

## 2020-06-02 DIAGNOSIS — Z86711 Personal history of pulmonary embolism: Secondary | ICD-10-CM

## 2020-06-08 MED ORDER — WARFARIN SODIUM 5 MG PO TABS: ORAL_TABLET | ORAL | 0 refills | Status: DC

## 2020-06-29 ENCOUNTER — Ambulatory Visit: Payer: Medicare HMO

## 2020-07-06 ENCOUNTER — Ambulatory Visit: Payer: Medicare HMO

## 2020-07-15 ENCOUNTER — Ambulatory Visit: Payer: Medicare HMO | Admitting: Neurology

## 2020-07-20 ENCOUNTER — Ambulatory Visit: Payer: Medicare HMO

## 2020-07-27 ENCOUNTER — Other Ambulatory Visit: Payer: Self-pay

## 2020-07-27 ENCOUNTER — Ambulatory Visit (INDEPENDENT_AMBULATORY_CARE_PROVIDER_SITE_OTHER): Payer: Medicare HMO | Admitting: Pharmacist

## 2020-07-27 DIAGNOSIS — Z86718 Personal history of other venous thrombosis and embolism: Secondary | ICD-10-CM | POA: Diagnosis not present

## 2020-07-27 LAB — POCT INR: INR: 2.3 (ref 2.0–3.0)

## 2020-07-27 NOTE — Progress Notes (Signed)
Anticoagulation Management Alexander English is a 65 y.o. male who reports to the clinic for monitoring of warfarin treatment.    Indication: DVT  Duration: indefinite Supervising physician: Gilles Chiquito  Anticoagulation Clinic Visit History: Patient does not report signs/symptoms of bleeding or thromboembolism  Other recent changes: No changes to diet, medications, lifestyle Anticoagulation Episode Summary    Current INR goal:  2.0-3.0  TTR:  81.1 % (8.3 y)  Next INR check:  09/07/2020  INR from last check:  2.3 (07/27/2020)  Weekly max warfarin dose:    Target end date:  Indefinite  INR check location:  Anticoagulation Clinic  Preferred lab:    Send INR reminders to:  [facility]   Indications   History of venous thromboembolism [Z86.718]       Comments:          No Known Allergies  Current Outpatient Medications:  .  aspirin EC 81 MG tablet, Take 81 mg by mouth daily., Disp: , Rfl:  .  atorvastatin (LIPITOR) 10 MG tablet, Take 1 tablet (10 mg total) by mouth daily., Disp: 90 tablet, Rfl: 3 .  fesoterodine (TOVIAZ) 8 MG TB24 tablet, Take 1 tablet (8 mg total) by mouth daily., Disp: 90 tablet, Rfl: 3 .  Multiple Vitamin (MULTIVITAMIN) tablet, Take 1 tablet by mouth daily., Disp: , Rfl:  .  MYRBETRIQ 25 MG TB24 tablet, Take 25 mg by mouth daily., Disp: , Rfl:  .  omeprazole (PRILOSEC) 40 MG capsule, Take 1 capsule (40 mg total) by mouth daily., Disp: 90 capsule, Rfl: 0 .  oxyCODONE-acetaminophen (PERCOCET/ROXICET) 5-325 MG tablet, Take 1 tablet by mouth every 12 (twelve) hours as needed for severe pain., Disp: 60 tablet, Rfl: 0 .  propranolol ER (INDERAL LA) 60 MG 24 hr capsule, Take 1 capsule (60 mg total) by mouth daily., Disp: 30 capsule, Rfl: 11 .  sertraline (ZOLOFT) 50 MG tablet, Take 1 tablet (50 mg total) by mouth daily., Disp: 90 tablet, Rfl: 3 .  tamsulosin (FLOMAX) 0.4 MG CAPS capsule, , Disp: , Rfl:  .  warfarin (COUMADIN) 5 MG tablet, TAKE 1 TABLET BY MOUTH  ONCE DAILY AT 6 IN THE EVENING EXCEPT ON Monday and Thursday TAKE 1/2 TABLET, Disp: 100 tablet, Rfl: 0 Past Medical History:  Diagnosis Date  . Aortic atherosclerosis ([place]) 10/23/2017   Asymptomatic, seen on CT scan  . Bilateral cataracts 05/18/2016   Not visually significant  . Cerebrovascular accident (CVA) ([place]) 03/27/2020  . Chronic constipation 06/12/2013  . Chronic low back pain 06/12/2013   L4-5 facet arthropathy   . Chronic pain of both shoulders 12/07/2013   A/C joint osteoarthritis   . Cluster headaches    Resolved in 2006  . COPD (chronic obstructive pulmonary disease) ([place])   . Dry eye syndrome, bilateral 05/18/2016  . Embolic stroke involving right middle cerebral artery ([place]) 08/08/2004   Occured after traveling from [place] to [place] where he was hospitalized for 1 month with no further work-up or therapy.  Returned to [place] unable to walk and was admitted to [facility] immediately after landing. Presumed embolic per neuro but TEE, bubble study, and hypercoag W/U negative. On indefinite coumadin per patient's informed preference.  Residual effects : Left spastic hemiplegia. Tx with phenol tibi  . Generalized anxiety disorder 08/03/2018  . History of kidney stones   . Hyperplastic colon polyp 07/27/2012   Excised endoscopically 07/27/2012  . Hypertensive retinopathy of both eyes 05/18/2016  . Internal and external hemorrhoids without complication 1/61/0960  Seen on colonoscopy 04/03/2007.  Marland Kitchen Left nephrolithiasis 08/31/2015   With mild left hydronephrosis.  Scheduled to undergo shockwave lithotripsy.  . Obesity (BMI 30.0-34.9) 12/07/2013  . Osteoarthritis of both knees 01/26/2012  . Positive PPD 12/07/2013  . Pulmonary embolism ([place]) 09/30/2004   Diagnosed in April 2006 after returning from [place].  Likely provoked as it occurred after a 12 hour plane flight within 2 months of R MCA CVA with left hemiparesis. Patient has made an informed decision to remain on lifelong warfarin.   . Tubular adenoma  12/19/2017   Endoscopically excised 04/03/2007.  Marland Kitchen Urge incontinence of urine 08/31/2015   Possibly secondary to prior CVA.  Treated with Myrbetriq.   Social History   Socioeconomic History  . Marital status: Single    Spouse name: Not on file  . Number of children: 3  . Years of education: 76  . Highest education level: Not on file  Occupational History  . Occupation: Retired  Tobacco Use  . Smoking status: Former Smoker    Types: Cigarettes    Quit date: 06/28/2007    Years since quitting: 13.0  . Smokeless tobacco: Never Used  Vaping Use  . Vaping Use: Never used  Substance and Sexual Activity  . Alcohol use: Yes    Alcohol/week: 0.0 standard drinks    Comment: Rarely.  . Drug use: No  . Sexual activity: Not on file  Other Topics Concern  . Not on file  Social History Narrative   Born in [place] but has been in the [place] for > 30 years.  Divorced, 1 daughter and 2 sons.  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   Right handed   Coffee/tea sometimes, soda rarely   Social Determinants of Health   Financial Resource Strain: Not on file  Food Insecurity: Not on file  Transportation Needs: Not on file  Physical Activity: Not on file  Stress: Not on file  Social Connections: Not on file   Family History  Problem Relation Age of Onset  . Stroke Maternal Grandmother   . Unexplained death Mother   . Depression Mother        After husband's death  . Unexplained death Father   . Osteoarthritis Father        Knees  . Early death Sister   . Seizures Sister   . Early death Brother 69       Unknown cause  . Healthy Daughter   . Healthy Son   . Stroke Maternal Aunt   . Healthy Sister   . Healthy Sister   . Unexplained death Brother   . Healthy Brother   . Healthy Son     ASSESSMENT Recent Results: The most recent result is correlated with 27.5  mg per week: Lab Results  Component Value Date   INR 2.3 07/27/2020   INR  3.0 06/01/2020   INR 2.4 05/07/2020    Anticoagulation Dosing: Description   Take one (1) tablet of your 5mg  peach-colored warfarin tablets by mouth, once-daily except on Mondays, Wednesdays and Fridays,  take 1/2 tablet at [facility].         INR today: Therapeutic  PLAN Weekly dose was unchanged.  Patient Instructions  Patient instructed to take medications as defined in the Anti-coagulation Track section of this encounter.  Patient instructed to take today's dose.  Patient instructed to take one (1) tablet of your 5mg  peach-colored warfarin tablets by mouth, once-daily  except on Mondays, Wednesdays and Fridays,  take 1/2 tablet at [facility].   Patient verbalized understanding of these instructions.     Patient advised to contact clinic or seek medical attention if signs/symptoms of bleeding or thromboembolism occur.  Patient verbalized understanding by repeating back information and was advised to contact me if further medication-related questions arise. Patient was also provided an information handout.  Follow-up Return in about 6 weeks (around 09/07/2020) for Follow up INR at 10:45AM.  Wilson Singer, PharmD PGY1 Pharmacy Resident 07/27/2020 10:10 AM   15 minutes spent face-to-face with the patient during the encounter. 50% of time spent on education, including signs/sx bleeding and clotting, as well as food and drug interactions with warfarin. 50% of time was spent on fingerprick POC INR sample collection,processing, results determination, and documentation in http://www.kim.net/.

## 2020-07-27 NOTE — Patient Instructions (Signed)
Patient instructed to take medications as defined in the Anti-coagulation Track section of this encounter.  Patient instructed to take today's dose.  Patient instructed to take one (1) tablet of your 5mg  peach-colored warfarin tablets by mouth, once-daily except on Mondays, Wednesdays and Fridays,  take 1/2 tablet at [facility].   Patient verbalized understanding of these instructions.

## 2020-08-12 ENCOUNTER — Other Ambulatory Visit: Payer: Self-pay

## 2020-08-12 DIAGNOSIS — F411 Generalized anxiety disorder: Secondary | ICD-10-CM

## 2020-08-12 DIAGNOSIS — E785 Hyperlipidemia, unspecified: Secondary | ICD-10-CM

## 2020-08-12 MED ORDER — SERTRALINE HCL 50 MG PO TABS: 50.0000 mg | ORAL_TABLET | Freq: Every day | ORAL | 3 refills | Status: DC

## 2020-08-12 MED ORDER — ATORVASTATIN CALCIUM 10 MG PO TABS: 10.0000 mg | ORAL_TABLET | Freq: Every day | ORAL | 3 refills | Status: DC

## 2020-08-24 ENCOUNTER — Encounter: Payer: Self-pay | Admitting: Neurology

## 2020-08-24 ENCOUNTER — Ambulatory Visit: Payer: Medicare HMO | Admitting: Neurology

## 2020-08-24 VITALS — BP 123/73 | HR 77 | Ht 72.0 in | Wt 232.0 lb

## 2020-08-24 DIAGNOSIS — R251 Tremor, unspecified: Secondary | ICD-10-CM | POA: Diagnosis not present

## 2020-08-24 DIAGNOSIS — I639 Cerebral infarction, unspecified: Secondary | ICD-10-CM

## 2020-08-24 NOTE — Progress Notes (Signed)
Chief Complaint  Patient presents with  . Follow-up    Pt states he is doing well, rm 15    HISTORICAL  Alexander English is a 65 year old male, seen in request by his primary care doctor Sid Falcon, MD For evaluation of right hand tremor, history of stroke, mild cognitive impairment, initial evaluation was March 28, 2019.  I reviewed and summarized the referring note.  Past medical history Hyperlipidemia History of stroke in February 2006 Pulmonary emboli, on chronic Coumadin treatment  He suffered stroke in February 2006 while visiting his family in [place], presented with left arm and leg weakness, slurred speech, went to rehabilitation for 1 month, continued his care at [place] afterwards,  We were able to review MRI of the brain in April 2006, acute to subacute infarction affecting the right insular, deep white matter of the corona radiata, and the parietal cortex, some area within the stroke had blood product accumulation  MRA of the brain showed decreased flow in the right MCA territory  CT angiogram in April 2006 showed large pulmonary emboli in, mainly to the right middle and lower lobe with segmental involvement of the left lower lobe  He was treated with warfarin since, also take aspirin, he now ambulate with left ankle and knee brace, cane,  Since March 2021, he noticed intermittent right hand tremor, noticed tremor when he holding a pen, but there was no resting tremor, he also complains of mild cognitive impairment, went into a room, would forgot why he is there  Update August 24, 2020 SS: Here today alone, c/o tremor to right hand, holding pin or cup of coffee. Started propranolol 60 mg LA before seeing Dr. Krista Blue, he thinks is helping. Tremor is improved some he thinks. No resting tremor. Mostly notices if he picks up something heavy, can only use right hand, since stroke to the left.   MRI of the brain October 2021 showed chronic ischemic infarction  involving right parietal and periinsular region, moderate supratentorial small vessel disease   RPR was negative, B12 slightly above range 1411. TSH 1.580 in Aug 2021. MMSE was 29/30. In October 2021. Live alone, drives.   REVIEW OF SYSTEMS: Full 14 system review of systems performed and notable only for as above  Tremor   ALLERGIES: No Known Allergies  HOME MEDICATIONS: Current Outpatient Medications  Medication Sig Dispense Refill  . aspirin EC 81 MG tablet Take 81 mg by mouth daily.    Marland Kitchen atorvastatin (LIPITOR) 10 MG tablet Take 1 tablet (10 mg total) by mouth daily. 90 tablet 3  . fesoterodine (TOVIAZ) 8 MG TB24 tablet Take 1 tablet (8 mg total) by mouth daily. 90 tablet 3  . Multiple Vitamin (MULTIVITAMIN) tablet Take 1 tablet by mouth daily.    Marland Kitchen MYRBETRIQ 25 MG TB24 tablet Take 25 mg by mouth daily.    Marland Kitchen omeprazole (PRILOSEC) 40 MG capsule Take 1 capsule (40 mg total) by mouth daily. 90 capsule 0  . oxyCODONE-acetaminophen (PERCOCET/ROXICET) 5-325 MG tablet Take 1 tablet by mouth every 12 (twelve) hours as needed for severe pain. 60 tablet 0  . propranolol ER (INDERAL LA) 60 MG 24 hr capsule Take 1 capsule (60 mg total) by mouth daily. 30 capsule 11  . sertraline (ZOLOFT) 50 MG tablet Take 1 tablet (50 mg total) by mouth daily. 90 tablet 3  . tamsulosin (FLOMAX) 0.4 MG CAPS capsule     . warfarin (COUMADIN) 5 MG tablet TAKE 1 TABLET BY MOUTH  ONCE DAILY AT 6 IN THE EVENING EXCEPT ON Monday and Thursday TAKE 1/2 TABLET 100 tablet 0   No current facility-administered medications for this visit.    PAST MEDICAL HISTORY: Past Medical History:  Diagnosis Date  . Aortic atherosclerosis ([place]) 10/23/2017   Asymptomatic, seen on CT scan  . Bilateral cataracts 05/18/2016   Not visually significant  . Cerebrovascular accident (CVA) ([place]) 03/27/2020  . Chronic constipation 06/12/2013  . Chronic low back pain 06/12/2013   L4-5 facet arthropathy   . Chronic pain of both shoulders  12/07/2013   A/C joint osteoarthritis   . Cluster headaches    Resolved in 2006  . COPD (chronic obstructive pulmonary disease) ([place])   . Dry eye syndrome, bilateral 05/18/2016  . Embolic stroke involving right middle cerebral artery ([place]) 08/08/2004   Occured after traveling from [place] to [place] where he was hospitalized for 1 month with no further work-up or therapy.  Returned to [place] unable to walk and was admitted to [facility] immediately after landing. Presumed embolic per neuro but TEE, bubble study, and hypercoag W/U negative. On indefinite coumadin per patient's informed preference.  Residual effects : Left spastic hemiplegia. Tx with phenol tibi  . Generalized anxiety disorder 08/03/2018  . History of kidney stones   . Hyperplastic colon polyp 07/27/2012   Excised endoscopically 07/27/2012  . Hypertensive retinopathy of both eyes 05/18/2016  . Internal and external hemorrhoids without complication 09/15/2246   Seen on colonoscopy 04/03/2007.  Marland Kitchen Left nephrolithiasis 08/31/2015   With mild left hydronephrosis.  Scheduled to undergo shockwave lithotripsy.  . Obesity (BMI 30.0-34.9) 12/07/2013  . Osteoarthritis of both knees 01/26/2012  . Positive PPD 12/07/2013  . Pulmonary embolism ([place]) 09/30/2004   Diagnosed in April 2006 after returning from [place].  Likely provoked as it occurred after a 12 hour plane flight within 2 months of R MCA CVA with left hemiparesis. Patient has made an informed decision to remain on lifelong warfarin.   . Tubular adenoma 12/19/2017   Endoscopically excised 04/03/2007.  Marland Kitchen Urge incontinence of urine 08/31/2015   Possibly secondary to prior CVA.  Treated with Myrbetriq.    PAST SURGICAL HISTORY: Past Surgical History:  Procedure Laterality Date  . EXCISION NASAL MASS Left 10/06/2017   Procedure: EXCISION LEFT NASAL MASS;  Surgeon: [name], MD;  Location: [place];  Service: ENT;  Laterality: Left;  Removal of intvering papilloma frozen section  . FOOT SURGERY Bilateral     Bunion  . I & D EXTREMITY Left 11/28/2001   Left thumb thenar space abscess.  Marland Kitchen LITHOTRIPSY  2017  . MAXILLECTOMY Left 11/22/2017   [name] 11/22/2017  . MAXILLECTOMY Left 11/22/2017   Procedure: MAXILLECTOMY;  Surgeon: [name], MD;  Location: [place];  Service: ENT;  Laterality: Left;  . NASAL SINUS SURGERY Left 10/06/2017   Procedure: ENDOSCOPIC SINUS SURGERY;  Surgeon: [name], MD;  Location: [place];  Service: ENT;  Laterality: Left;  Left side Endoscopic Macillary and Ethmoid Sinus  . SKIN SPLIT GRAFT Left 11/22/2017   Procedure: SKIN GRAFT SPLIT THICKNESS;  Surgeon: [name], MD;  Location: [facility] OR;  Service: ENT;  Laterality: Left;    FAMILY HISTORY: Family History  Problem Relation Age of Onset  . Stroke Maternal Grandmother   . Unexplained death Mother   . Depression Mother        After husband's death  . Unexplained death Father   . Osteoarthritis Father        Knees  .  Early death Sister   . Seizures Sister   . Early death Brother 50       Unknown cause  . Healthy Daughter   . Healthy Son   . Stroke Maternal Aunt   . Healthy Sister   . Healthy Sister   . Unexplained death Brother   . Healthy Brother   . Healthy Son     SOCIAL HISTORY: Social History   Socioeconomic History  . Marital status: Single    Spouse name: Not on file  . Number of children: 3  . Years of education: 36  . Highest education level: Not on file  Occupational History  . Occupation: Retired  Tobacco Use  . Smoking status: Former Smoker    Types: Cigarettes    Quit date: 06/28/2007    Years since quitting: 13.1  . Smokeless tobacco: Never Used  Vaping Use  . Vaping Use: Never used  Substance and Sexual Activity  . Alcohol use: Yes    Alcohol/week: 0.0 standard drinks    Comment: Rarely.  . Drug use: No  . Sexual activity: Not on file  Other Topics Concern  . Not on file  Social History Narrative   Born in [place] but has been in the [place] for > 30 years.  Divorced, 1  daughter and 2 sons.  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   Right handed   Coffee/tea sometimes, soda rarely   Social Determinants of Health   Financial Resource Strain: Not on file  Food Insecurity: Not on file  Transportation Needs: Not on file  Physical Activity: Not on file  Stress: Not on file  Social Connections: Not on file  Intimate Partner Violence: Not on file  PHYSICAL EXAM   Vitals:   08/24/20 1518  BP: 123/73  Pulse: 77  Weight: 232 lb (105.2 kg)  Height: 6' (1.829 m)   Not recorded     Body mass index is 31.46 kg/m.  PHYSICAL EXAMNIATION:  Gen: NAD, conversant, well nourised, well groomed                      NEUROLOGICAL EXAM:  MENTAL STATUS: Speech/cognition: MMSE - Mini Mental State Exam 03/27/2020  Orientation to time 5  Orientation to Place 5  Registration 3  Attention/ Calculation 5  Recall 2  Language- name 2 objects 2  Language- repeat 1  Language- follow 3 step command 3  Language- read & follow direction 1  Write a sentence 1  Copy design 1  Total score 29   CRANIAL NERVES: CN II: Visual fields are full to confrontation. Pupils are round equal and briskly reactive to light. CN III, IV, VI: extraocular movement are normal. No ptosis. CN V: Facial sensation is intact to light touch CN VII: Face is symmetric with normal eye closure  CN VIII: Hearing is normal to causal conversation. CN IX, X: Phonation is normal. CN XI: Head turning and shoulder shrug are intact  MOTOR: Spastic left hemiparesis left upper extremity has antigravity movement, wear left AFO  Right upper extremity normal muscle tone, strength, no rigidity, no bradykinesia noted today. Handwriting sample is clearly legible, no significant tremor translated.  REFLEXES: Hyperreflexia at left upper and lower extremity.  SENSORY: Intact to light touch to face, arms, legs  COORDINATION: Finger-nose-finger is intact  bilaterally, heel-to-shin is intact with the right.  No tremor was noted with finger-nose-finger.  GAIT/STANCE: Has to  push off from seated position to stand, has to drag the left leg, relies on cane  DIAGNOSTIC DATA (LABS, IMAGING, TESTING) - I reviewed patient records, labs, notes, testing and imaging myself where available.  ASSESSMENT AND PLAN  LUGENE BEOUGHER is a 65 y.o. male   1. Right MCA stroke in 2006, with residual spastic left hemiparesis  2. New onset right hand tremor  3. Mild cognitive impairment  -Differentiation diagnosis exaggerated physiological tremor  -No signs of Parkinson's disease noted on exam such as rigidity or bradykinesia  -Has reportedly benefited from propanolol, no tremor was seen on exam   -MRI of the brain October 2021 showed chronic ischemic infarction involving right parietal and periinsular region, moderate supratentorial small vessel disease, already on Coumadin and aspirin   -RPR was negative, B12 slightly above range 1411, TSH 1.580 in August 2021  -He lives alone, drives a car, seems to do this well  -Recommend continue on propanolol, will see the patient back in 1 year or sooner if needed to ensure stability, if he remains overall stable, can follow-up on an as-needed basis  I spent 30 minutes of face-to-face and non-face-to-face time with patient.  This included previsit chart review, lab review, study review, order entry, electronic health record documentation, patient education.  Evangeline Dakin, DNP  [facility] [address] [place], [place] [address] [phone]

## 2020-08-24 NOTE — Patient Instructions (Signed)
Continue with the propranolol  See you back in 1 year

## 2020-09-01 DIAGNOSIS — C310 Malignant neoplasm of maxillary sinus: Secondary | ICD-10-CM | POA: Diagnosis not present

## 2020-09-07 ENCOUNTER — Ambulatory Visit (INDEPENDENT_AMBULATORY_CARE_PROVIDER_SITE_OTHER): Payer: Medicare HMO | Admitting: Pharmacist

## 2020-09-07 DIAGNOSIS — Z86718 Personal history of other venous thrombosis and embolism: Secondary | ICD-10-CM

## 2020-09-07 LAB — POCT INR: INR: 2.6 (ref 2.0–3.0)

## 2020-09-07 NOTE — Patient Instructions (Signed)
Patient instructed to take medications as defined in the Anti-coagulation Track section of this encounter.  Patient instructed to take today's dose.  Patient instructed to take one (1) tablet of your 5mg peach-colored warfarin tablets by mouth, once-daily except on Mondays, Wednesdays and Fridays,  take 1/2 tablet at 6PM.   Patient verbalized understanding of these instructions.    

## 2020-09-07 NOTE — Progress Notes (Addendum)
Anticoagulation Management Alexander English is a 65 y.o. male who reports to the clinic for monitoring of warfarin treatment.    Indication: DVT  Duration: indefinite Supervising physician: Aldine Contes  Anticoagulation Clinic Visit History: Patient does not report signs/symptoms of bleeding or thromboembolism  Other recent changes: no changes to diet, medications, lifestyle Anticoagulation Episode Summary    Current INR goal:  2.0-3.0  TTR:  81.3 % (8.5 y)  Next INR check:  10/19/2020  INR from last check:  2.6 (09/07/2020)  Weekly max warfarin dose:    Target end date:  Indefinite  INR check location:  Anticoagulation Clinic  Preferred lab:    Send INR reminders to:  ANTICOAG [facility]   Indications   History of venous thromboembolism [Z86.718]       Comments:          No Known Allergies  Current Outpatient Medications:  .  aspirin EC 81 MG tablet, Take 81 mg by mouth daily., Disp: , Rfl:  .  atorvastatin (LIPITOR) 10 MG tablet, Take 1 tablet (10 mg total) by mouth daily., Disp: 90 tablet, Rfl: 3 .  fesoterodine (TOVIAZ) 8 MG TB24 tablet, Take 1 tablet (8 mg total) by mouth daily., Disp: 90 tablet, Rfl: 3 .  Multiple Vitamin (MULTIVITAMIN) tablet, Take 1 tablet by mouth daily., Disp: , Rfl:  .  MYRBETRIQ 25 MG TB24 tablet, Take 25 mg by mouth daily., Disp: , Rfl:  .  omeprazole (PRILOSEC) 40 MG capsule, Take 1 capsule (40 mg total) by mouth daily., Disp: 90 capsule, Rfl: 0 .  oxyCODONE-acetaminophen (PERCOCET/ROXICET) 5-325 MG tablet, Take 1 tablet by mouth every 12 (twelve) hours as needed for severe pain., Disp: 60 tablet, Rfl: 0 .  propranolol ER (INDERAL LA) 60 MG 24 hr capsule, Take 1 capsule (60 mg total) by mouth daily., Disp: 30 capsule, Rfl: 11 .  sertraline (ZOLOFT) 50 MG tablet, Take 1 tablet (50 mg total) by mouth daily., Disp: 90 tablet, Rfl: 3 .  tamsulosin (FLOMAX) 0.4 MG CAPS capsule, , Disp: , Rfl:  .  warfarin (COUMADIN) 5 MG tablet, TAKE 1 TABLET BY  MOUTH ONCE DAILY AT 6 IN THE EVENING EXCEPT ON Monday and Thursday TAKE 1/2 TABLET, Disp: 100 tablet, Rfl: 0 Past Medical History:  Diagnosis Date  . Aortic atherosclerosis ([place]) 10/23/2017   Asymptomatic, seen on CT scan  . Bilateral cataracts 05/18/2016   Not visually significant  . Cerebrovascular accident (CVA) ([place]) 03/27/2020  . Chronic constipation 06/12/2013  . Chronic low back pain 06/12/2013   L4-5 facet arthropathy   . Chronic pain of both shoulders 12/07/2013   A/C joint osteoarthritis   . Cluster headaches    Resolved in 2006  . COPD (chronic obstructive pulmonary disease) ([place])   . Dry eye syndrome, bilateral 05/18/2016  . Embolic stroke involving right middle cerebral artery ([place]) 08/08/2004   Occured after traveling from [place] to [place] where he was hospitalized for 1 month with no further work-up or therapy.  Returned to [place] unable to walk and was admitted to [facility] immediately after landing. Presumed embolic per neuro but TEE, bubble study, and hypercoag W/U negative. On indefinite coumadin per patient's informed preference.  Residual effects : Left spastic hemiplegia. Tx with phenol tibi  . Generalized anxiety disorder 08/03/2018  . History of kidney stones   . Hyperplastic colon polyp 07/27/2012   Excised endoscopically 07/27/2012  . Hypertensive retinopathy of both eyes 05/18/2016  . Internal and external hemorrhoids without complication 3/42/8768  Seen on colonoscopy 04/03/2007.  Marland Kitchen Left nephrolithiasis 08/31/2015   With mild left hydronephrosis.  Scheduled to undergo shockwave lithotripsy.  . Obesity (BMI 30.0-34.9) 12/07/2013  . Osteoarthritis of both knees 01/26/2012  . Positive PPD 12/07/2013  . Pulmonary embolism ([place]) 09/30/2004   Diagnosed in April 2006 after returning from [place].  Likely provoked as it occurred after a 12 hour plane flight within 2 months of R MCA CVA with left hemiparesis. Patient has made an informed decision to remain on lifelong warfarin.   . Tubular  adenoma 12/19/2017   Endoscopically excised 04/03/2007.  Marland Kitchen Urge incontinence of urine 08/31/2015   Possibly secondary to prior CVA.  Treated with Myrbetriq.   Social History   Socioeconomic History  . Marital status: Single    Spouse name: Not on file  . Number of children: 3  . Years of education: 38  . Highest education level: Not on file  Occupational History  . Occupation: Retired  Tobacco Use  . Smoking status: Former Smoker    Types: Cigarettes    Quit date: 06/28/2007    Years since quitting: 13.2  . Smokeless tobacco: Never Used  Vaping Use  . Vaping Use: Never used  Substance and Sexual Activity  . Alcohol use: Yes    Alcohol/week: 0.0 standard drinks    Comment: Rarely.  . Drug use: No  . Sexual activity: Not on file  Other Topics Concern  . Not on file  Social History Narrative   Born in [place] but has been in the [place] for > 30 years.  Divorced, 1 daughter and 2 sons.  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   Right handed   Coffee/tea sometimes, soda rarely   Social Determinants of Health   Financial Resource Strain: Not on file  Food Insecurity: Not on file  Transportation Needs: Not on file  Physical Activity: Not on file  Stress: Not on file  Social Connections: Not on file   Family History  Problem Relation Age of Onset  . Stroke Maternal Grandmother   . Unexplained death Mother   . Depression Mother        After husband's death  . Unexplained death Father   . Osteoarthritis Father        Knees  . Early death Sister   . Seizures Sister   . Early death Brother 67       Unknown cause  . Healthy Daughter   . Healthy Son   . Stroke Maternal Aunt   . Healthy Sister   . Healthy Sister   . Unexplained death Brother   . Healthy Brother   . Healthy Son     ASSESSMENT Recent Results: The most recent result is correlated with 27.5 mg per week: Lab Results  Component Value Date   INR 2.6 09/07/2020    INR 2.3 07/27/2020   INR 3.0 06/01/2020    Anticoagulation Dosing: Description   Take one (1) tablet of your 5mg  peach-colored warfarin tablets by mouth, once-daily except on Mondays, Wednesdays and Fridays,  take 1/2 tablet at [facility]         INR today: Therapeutic  PLAN Weekly dose was unchanged   Patient Instructions  Patient instructed to take medications as defined in the Anti-coagulation Track section of this encounter.  Patient instructed to take today's dose.  Patient instructed to take  one (1) tablet of your 5mg  peach-colored warfarin tablets by mouth,  once-daily except on Mondays, Wednesdays and Fridays,  take 1/2 tablet at [facility].   Patient verbalized understanding of these instructions.     Patient advised to contact clinic or seek medical attention if signs/symptoms of bleeding or thromboembolism occur.  Patient verbalized understanding by repeating back information and was advised to contact me if further medication-related questions arise. Patient was also provided an information handout.  Follow-up Return in about 5 weeks (around 10/15/2020) for follow up INR at 9:45 AM.  Wilson Singer, PharmD PGY1 Pharmacy Resident 09/07/2020 11:03 AM  15 minutes spent face-to-face with the patient during the encounter. 50% of time spent on education, including signs/sx bleeding and clotting, as well as food and drug interactions with warfarin. 50% of time was spent on fingerprick POC INR sample collection,processing, results determination, and documentation in [email or web address]

## 2020-09-09 NOTE — Progress Notes (Signed)
INTERNAL MEDICINE TEACHING ATTENDING ADDENDUM - Odie Edmonds M.D  Duration- indefinite, Indication- hx of VTE, CVA, INR- therapeutic. Agree with pharmacy recommendations as outlined in their note.

## 2020-09-23 ENCOUNTER — Encounter: Payer: Self-pay | Admitting: *Deleted

## 2020-09-23 NOTE — Progress Notes (Signed)

## 2020-10-02 NOTE — Progress Notes (Signed)
Things That May Be Affecting Your Health:  Alcohol  Hearing loss  Pain    Depression  Home Safety  Sexual Health   Diabetes x Lack of physical activity x Stress   Difficulty with daily activities  Loneliness  Tiredness   Drug use  Medicines  Tobacco use  x Falls  Motor Vehicle Safety  Weight   Food choices  Oral Health  Other    YOUR PERSONALIZED HEALTH PLAN : 1. Schedule your next subsequent Medicare Wellness visit in one year 2. Attend all of your regular appointments to address your medical issues 3. Complete the preventative screenings and services   Annual Wellness Visit   Medicare Covered Preventative Screenings and [place] Men and Women Who How Often Need? Date of Last Service Action  Abdominal Aortic Aneurysm Adults with AAA risk factors Once y   Please offer  Alcohol Misuse and Counseling All Adults Screening once a year if no alcohol misuse. Counseling up to 4 face to face sessions. y    Bone Density Measurement  Adults at risk for osteoporosis Once every 2 yrs n     Lipid Panel Z13.6 All adults without CV disease Once every 5 yrs n 2020     Colorectal Cancer   Stool sample or  Colonoscopy All adults 62 and older   Once every year  Every 10 years n   2019    Depression All Adults Once a year  Today   Diabetes Screening Blood glucose, post glucose load, or GTT Z13.1  All adults at risk  Pre-diabetics  Once per year  Twice per year n     Diabetes  Self-Management Training All adults Diabetics 10 hrs first year; 2 hours subsequent years. Requires Copay n    Glaucoma  Diabetics  Family history of glaucoma  African Americans 80 yrs +  Hispanic Americans 17 yrs + Annually - requires coppay y     Hepatitis C Z72.89 or F19.20  High Risk for HCV  Born between 1945 and 1965  Annually  Once n  2016   HIV Z11.4 All adults based on risk  Annually btw ages 80 & 30 regardless of risk  Annually > 65 yrs if at increased risk n  79    Lung Cancer Screening Asymptomatic adults aged 57-77 with 2 pack yr history and current smoker OR quit within the last 15 yrs Annually Must have counseling and shared decision making documentation before first screen y  2019 Please offer   Medical Nutrition Therapy Adults with   Diabetes  Renal disease  Kidney transplant within past 3 yrs 3 hours first year; 2 hours subsequent years n    Obesity and Counseling All adults Screening once a year Counseling if BMI 30 or higher  Today   Tobacco Use Counseling Adults who use tobacco  Up to 8 visits in one year     Vaccines Z23  Hepatitis B  Influenza   Pneumonia  Adults   Once  Once every flu season  Two different vaccines separated by one year ?  Has he completed COVID series  Next Annual Wellness Visit People with Medicare Every year  Today     [place] Women Who How Often Need  Date of Last Service Action  Mammogram  Z12.31 Women over 87 One baseline ages 74-39. Annually ager 40 yrs+      Pap tests All women Annually if high risk. Every 2 yrs for normal  risk women      Screening for cervical cancer with   Pap (Z01.419 nl or Z01.411abnl) &  HPV Z11.51 Women aged 71 to 71 Once every 5 yrs     Screening pelvic and breast exams All women Annually if high risk. Every 2 yrs for normal risk women     Sexually Transmitted Diseases  Chlamydia  Gonorrhea  Syphilis All at risk adults Annually for non pregnant females at increased risk         [employer] Men Who How Ofter Need  Date of Last Service Action  Prostate Cancer - DRE & PSA Men over 50 Annually.  DRE might require a copay.        Sexually Transmitted Diseases  Syphilis All at risk adults Annually for men at increased risk      Health Maintenance List Health Maintenance  Topic Date Due  . COVID-19 Vaccine (2 - Pfizer risk 4-dose series) 05/16/2020  . INFLUENZA VACCINE  01/25/2021  . PNA vac Low Risk Adult (2 of 2 -  PPSV23) 10/16/2024  . TETANUS/TDAP  03/26/2025  . COLONOSCOPY (Pts 45-69yrs Insurance coverage will need to be confirmed)  05/23/2028  . Hepatitis C Screening  Completed  . HIV Screening  Completed  . HPV VACCINES  Aged Out

## 2020-10-15 ENCOUNTER — Ambulatory Visit (INDEPENDENT_AMBULATORY_CARE_PROVIDER_SITE_OTHER): Payer: Medicare HMO | Admitting: Pharmacist

## 2020-10-15 ENCOUNTER — Other Ambulatory Visit: Payer: Self-pay

## 2020-10-15 ENCOUNTER — Ambulatory Visit (INDEPENDENT_AMBULATORY_CARE_PROVIDER_SITE_OTHER): Payer: Medicare HMO | Admitting: Internal Medicine

## 2020-10-15 VITALS — BP 133/77 | HR 65 | Temp 98.2°F | Ht 72.0 in | Wt 242.6 lb

## 2020-10-15 DIAGNOSIS — R7303 Prediabetes: Secondary | ICD-10-CM

## 2020-10-15 DIAGNOSIS — Z86718 Personal history of other venous thrombosis and embolism: Secondary | ICD-10-CM

## 2020-10-15 DIAGNOSIS — M25552 Pain in left hip: Secondary | ICD-10-CM | POA: Diagnosis not present

## 2020-10-15 DIAGNOSIS — R3129 Other microscopic hematuria: Secondary | ICD-10-CM

## 2020-10-15 DIAGNOSIS — M160 Bilateral primary osteoarthritis of hip: Secondary | ICD-10-CM

## 2020-10-15 DIAGNOSIS — Z7901 Long term (current) use of anticoagulants: Secondary | ICD-10-CM

## 2020-10-15 DIAGNOSIS — N200 Calculus of kidney: Secondary | ICD-10-CM

## 2020-10-15 DIAGNOSIS — R32 Unspecified urinary incontinence: Secondary | ICD-10-CM | POA: Diagnosis not present

## 2020-10-15 DIAGNOSIS — M172 Bilateral post-traumatic osteoarthritis of knee: Secondary | ICD-10-CM

## 2020-10-15 LAB — POCT INR: INR: 2.7 (ref 2.0–3.0)

## 2020-10-15 NOTE — Assessment & Plan Note (Signed)
Patient has noticed episodic hematuria. Will obtain UA today

## 2020-10-15 NOTE — Patient Instructions (Signed)
Patient instructed to take medications as defined in the Anti-coagulation Track section of this encounter.  Patient instructed to take today's dose.  Patient instructed to take one (1) tablet of your 5mg  peach-colored warfarin tablets by mouth, once-daily except on Mondays, Wednesdays and Fridays,  take 1/2 tablet at [facility].  Patient verbalized understanding of these instructions.

## 2020-10-15 NOTE — Progress Notes (Signed)
This is a Careers information officer Note.  The care of the patient was discussed with Dr. Heber [place] and the assessment and plan was formulated with their assistance.    Subjective:   [name]   DOB: 10-14-1955 65 y.o.   MRN: [id number]  HPI: [name] is a 65 y.o. male with a past medical history as listed who presents for evaluation of left hip pain and right knee pain. See problem based charting for more details.     Past Medical History:  Diagnosis Date  . Aortic atherosclerosis ([place]) 10/23/2017   Asymptomatic, seen on CT scan  . Bilateral cataracts 05/18/2016   Not visually significant  . Cerebrovascular accident (CVA) ([place]) 03/27/2020  . Chronic constipation 06/12/2013  . Chronic low back pain 06/12/2013   L4-5 facet arthropathy   . Chronic pain of both shoulders 12/07/2013   A/C joint osteoarthritis   . Cluster headaches    Resolved in 2006  . COPD (chronic obstructive pulmonary disease) ([place])   . Dry eye syndrome, bilateral 05/18/2016  . Embolic stroke involving right middle cerebral artery ([place]) 08/08/2004   Occured after traveling from [place] to [place] where he was hospitalized for 1 month with no further work-up or therapy.  Returned to [place] unable to walk and was admitted to [facility] immediately after landing. Presumed embolic per neuro but TEE, bubble study, and hypercoag W/U negative. On indefinite coumadin per patient's informed preference.  Residual effects : Left spastic hemiplegia. Tx with phenol tibi  . Generalized anxiety disorder 08/03/2018  . History of kidney stones   . Hyperplastic colon polyp 07/27/2012   Excised endoscopically 07/27/2012  . Hypertensive retinopathy of both eyes 05/18/2016  . Internal and external hemorrhoids without complication 9/32/3557   Seen on colonoscopy 04/03/2007.  Marland Kitchen Left nephrolithiasis 08/31/2015   With mild left hydronephrosis.  Scheduled to undergo shockwave lithotripsy.  . Obesity (BMI 30.0-34.9) 12/07/2013  .  Osteoarthritis of both knees 01/26/2012  . Positive PPD 12/07/2013  . Pulmonary embolism ([place]) 09/30/2004   Diagnosed in April 2006 after returning from [place].  Likely provoked as it occurred after a 12 hour plane flight within 2 months of R MCA CVA with left hemiparesis. Patient has made an informed decision to remain on lifelong warfarin.   . Tubular adenoma 12/19/2017   Endoscopically excised 04/03/2007.  Marland Kitchen Urge incontinence of urine 08/31/2015   Possibly secondary to prior CVA.  Treated with Myrbetriq.   Current Outpatient Medications  Medication Sig Dispense Refill  . aspirin EC 81 MG tablet Take 81 mg by mouth daily.    Marland Kitchen atorvastatin (LIPITOR) 10 MG tablet Take 1 tablet (10 mg total) by mouth daily. 90 tablet 3  . fesoterodine (TOVIAZ) 8 MG TB24 tablet Take 1 tablet (8 mg total) by mouth daily. 90 tablet 3  . Multiple Vitamin (MULTIVITAMIN) tablet Take 1 tablet by mouth daily.    Marland Kitchen MYRBETRIQ 25 MG TB24 tablet Take 25 mg by mouth daily.    Marland Kitchen oxyCODONE-acetaminophen (PERCOCET/ROXICET) 5-325 MG tablet Take 1 tablet by mouth every 12 (twelve) hours as needed for severe pain. 60 tablet 0  . propranolol ER (INDERAL LA) 60 MG 24 hr capsule Take 1 capsule (60 mg total) by mouth daily. 30 capsule 11  . sertraline (ZOLOFT) 50 MG tablet Take 1 tablet (50 mg total) by mouth daily. 90 tablet 3  . tamsulosin (FLOMAX) 0.4 MG CAPS capsule     . warfarin (COUMADIN) 5 MG tablet  TAKE 1 TABLET BY MOUTH ONCE DAILY AT 6 IN THE EVENING EXCEPT ON Monday and Thursday TAKE 1/2 TABLET 100 tablet 0   No current facility-administered medications for this visit.   Family History  Problem Relation Age of Onset  . Stroke Maternal Grandmother   . Unexplained death Mother   . Depression Mother        After husband's death  . Unexplained death Father   . Osteoarthritis Father        Knees  . Early death Sister   . Seizures Sister   . Early death Brother 67       Unknown cause  . Healthy Daughter   . Healthy Son    . Stroke Maternal Aunt   . Healthy Sister   . Healthy Sister   . Unexplained death Brother   . Healthy Brother   . Healthy Son    Social History   Socioeconomic History  . Marital status: Single    Spouse name: Not on file  . Number of children: 3  . Years of education: 52  . Highest education level: Not on file  Occupational History  . Occupation: Retired  Tobacco Use  . Smoking status: Former Smoker    Types: Cigarettes    Quit date: 06/28/2007    Years since quitting: 13.3  . Smokeless tobacco: Never Used  Vaping Use  . Vaping Use: Never used  Substance and Sexual Activity  . Alcohol use: Yes    Alcohol/week: 0.0 standard drinks    Comment: Rarely.  . Drug use: No  . Sexual activity: Not on file  Other Topics Concern  . Not on file  Social History Narrative   Born in [place] but has been in the [place] for > 30 years.  Divorced, 1 daughter and 2 sons.  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   Right handed   Coffee/tea sometimes, soda rarely   Social Determinants of Health   Financial Resource Strain: Not on file  Food Insecurity: Not on file  Transportation Needs: Not on file  Physical Activity: Not on file  Stress: Not on file  Social Connections: Not on file   Review of Systems: Pertinent items noted in HPI and remainder of comprehensive ROS otherwise negative. Objective:  Physical Exam: Vitals:   10/15/20 0951  BP: 133/77  Pulse: 65  Temp: 98.2 F (36.8 C)  TempSrc: Oral  SpO2: 100%  Weight: 242 lb 9.6 oz (110 kg)  Height: 6' (1.829 m)   General: Well appearing and in no acute distress, appears stated age Neuro: A&O x4, normal affect Cardiovascular: RRR, no m/r/g. Normal S1 and S2 without S3 or S4. Pulses 2+ in bilateral UE and LE. No peripheral edema Pulmonary: CTAB, no wheezes, rhonchi or rales. Normal WOB, no clubbing  Skin: Warm and dry with no rashes, cuts, or bruises MSK: Normal ROM of all  extremities. Strength 5/5 in bilateral UE and R LE. Strength 4/5 on L LE. No tenderness to palpation over spine or paraspinal muscles. No tenderness to palpation of bilateral hips, thighs, or knees. No laxity of R knee to varus/valgus stress tests or tenderness with compression of patella.   Assessment & Plan:  See problem based charting for more details

## 2020-10-15 NOTE — Patient Instructions (Signed)
It was a pleasure seeing you today! Your hip pain and knee pain are both likely due to your osteoarthritis and we recommend treating them with the following things. Additionally, I have included a list of anti-inflammatory foods you can incorporate into your diet to help with the pain: - tylenol (can take up to 3000mg  per day) - topical agents (blue emu oil, voltaren gel, capsaicin, lidocaine patches)  There are other things we can do for the pain if the measures above fail to give you relief. Please come back to see [place] if your symptoms persist or worsen.    Anti-inflammatory foods: 1) Ginger (especially studied for arthritis)- reduce leukotriene production to decrease inflammation 2) Blueberries- high in phytonutrients that decrease inflammation 3) Salmon- marine omega-3s reduce joint swelling and pain 4) Pumpkin seeds- reduce inflammation 5) dark chocolate- reduces inflammation 6) turmeric- reduces inflammation 7) tart cherries - reduce pain and stiffness 8) extra virgin olive oil - its compound olecanthal helps to block prostaglandins  9) chili peppers- can be eaten or applied topically via capsaicin 10) mint- helpful for headache, muscle aches, joint pain, and itching 11) garlic- reduces inflammation

## 2020-10-15 NOTE — Assessment & Plan Note (Addendum)
Patient complains of right knee pain that is giving him difficulty with walking and standing up from a seated position.   A&P: worsening osteoarthritis exacerbated by increased pressure put on the right leg due to left sided hemiparesis. No red flags concerning for mass lesion. Physical exam unremarkable. - no imaging needed at this time  - recommended tylenol and topical agents (blue emu oil, voltaren gel, capsaicin, and lidocaine patches) - provided list of anti-inflammatory foods to integrate into diet - follow up if worsening

## 2020-10-15 NOTE — Assessment & Plan Note (Signed)
Patient presents with episodic, 8/10 burning pain in his left hip that radiates to his back and left thigh region. He endorses numbness and tingling of his left leg down to his foot but he also has left hemiparesis s/p CVA. The pain is the worst with exertion and improves with sitting down. It is not worse at a given time of day. It is not improved with forward flexion of the spine when standing. Tylenol sometimes helps with the pain but he has not been taking it regularly. He has percocet but has not been taking it due to concern for addiction. He does endorse an episode of "bowel incontinence" but explains that he had one episode of postprandial urgency to defecate and was able to make it to the restroom on time. He also endorses bladder incontinence but he is currently taking tamsulosin secondary to BPH. He does not report changes in the bowel or bladder symptoms since this hip pain started. Denies fever, weight loss, chills, night sweats, and saddle anesthesia. Prior imaging was notable for OA of the left hip.   A&P: Chronic worsening hip pain radiating to the back and thigh in the setting of hip OA. Highest on the differential is pain secondary to worsening osteoarthritis. Also considered spinal metastasis given patient's history of malignancy and osteomyelitis but no tenderness to palpation of spine, B symptoms, or red flags concerning for malignancy or mass lesion.  - No imaging indicated at this time - Recommended pain control with acetaminophen and topical agents (blue emu oil, capsaicin, voltaren gel, and lidocaine patches). Can consider steroid injection in the future if no relief with conservative management - Return if pain worsens or if there is no relief with the topical agents.

## 2020-10-15 NOTE — Progress Notes (Signed)
Anticoagulation Management Alexander English is a 65 y.o. male who reports to the clinic for monitoring of warfarin treatment.    Indication: DVT, History of; Long term current use of oral anticoagulant warfarin to maintain INR 2.0 - 3.0. Duration: indefinite Supervising physician: Joni Reining  Anticoagulation Clinic Visit History: Patient does not report signs/symptoms of bleeding or thromboembolism  Other recent changes: No diet, medications, lifestyle changes endorsed by the patient at this visit.  Anticoagulation Episode Summary    Current INR goal:  2.0-3.0  TTR:  81.6 % (8.6 y)  Next INR check:  11/30/2020  INR from last check:  2.7 (10/15/2020)  Weekly max warfarin dose:    Target end date:  Indefinite  INR check location:  Anticoagulation Clinic  Preferred lab:    Send INR reminders to:  [facility]   Indications   History of venous thromboembolism [Z86.718]       Comments:          No Known Allergies  Current Outpatient Medications:  .  aspirin EC 81 MG tablet, Take 81 mg by mouth daily., Disp: , Rfl:  .  atorvastatin (LIPITOR) 10 MG tablet, Take 1 tablet (10 mg total) by mouth daily., Disp: 90 tablet, Rfl: 3 .  fesoterodine (TOVIAZ) 8 MG TB24 tablet, Take 1 tablet (8 mg total) by mouth daily., Disp: 90 tablet, Rfl: 3 .  Multiple Vitamin (MULTIVITAMIN) tablet, Take 1 tablet by mouth daily., Disp: , Rfl:  .  MYRBETRIQ 25 MG TB24 tablet, Take 25 mg by mouth daily., Disp: , Rfl:  .  oxyCODONE-acetaminophen (PERCOCET/ROXICET) 5-325 MG tablet, Take 1 tablet by mouth every 12 (twelve) hours as needed for severe pain., Disp: 60 tablet, Rfl: 0 .  propranolol ER (INDERAL LA) 60 MG 24 hr capsule, Take 1 capsule (60 mg total) by mouth daily., Disp: 30 capsule, Rfl: 11 .  sertraline (ZOLOFT) 50 MG tablet, Take 1 tablet (50 mg total) by mouth daily., Disp: 90 tablet, Rfl: 3 .  tamsulosin (FLOMAX) 0.4 MG CAPS capsule, , Disp: , Rfl:  .  warfarin (COUMADIN) 5 MG tablet, TAKE 1  TABLET BY MOUTH ONCE DAILY AT 6 IN THE EVENING EXCEPT ON Monday and Thursday TAKE 1/2 TABLET, Disp: 100 tablet, Rfl: 0 Past Medical History:  Diagnosis Date  . Aortic atherosclerosis ([place]) 10/23/2017   Asymptomatic, seen on CT scan  . Bilateral cataracts 05/18/2016   Not visually significant  . Cerebrovascular accident (CVA) ([place]) 03/27/2020  . Chronic constipation 06/12/2013  . Chronic low back pain 06/12/2013   L4-5 facet arthropathy   . Chronic pain of both shoulders 12/07/2013   A/C joint osteoarthritis   . Cluster headaches    Resolved in 2006  . COPD (chronic obstructive pulmonary disease) ([place])   . Dry eye syndrome, bilateral 05/18/2016  . Embolic stroke involving right middle cerebral artery ([place]) 08/08/2004   Occured after traveling from [place] to [place] where he was hospitalized for 1 month with no further work-up or therapy.  Returned to [place] unable to walk and was admitted to [facility] immediately after landing. Presumed embolic per neuro but TEE, bubble study, and hypercoag W/U negative. On indefinite coumadin per patient's informed preference.  Residual effects : Left spastic hemiplegia. Tx with phenol tibi  . Generalized anxiety disorder 08/03/2018  . History of kidney stones   . Hyperplastic colon polyp 07/27/2012   Excised endoscopically 07/27/2012  . Hypertensive retinopathy of both eyes 05/18/2016  . Internal and external hemorrhoids without complication  12/19/2017   Seen on colonoscopy 04/03/2007.  Marland Kitchen Left nephrolithiasis 08/31/2015   With mild left hydronephrosis.  Scheduled to undergo shockwave lithotripsy.  . Obesity (BMI 30.0-34.9) 12/07/2013  . Osteoarthritis of both knees 01/26/2012  . Positive PPD 12/07/2013  . Pulmonary embolism ([place]) 09/30/2004   Diagnosed in April 2006 after returning from [place].  Likely provoked as it occurred after a 12 hour plane flight within 2 months of R MCA CVA with left hemiparesis. Patient has made an informed decision to remain on lifelong warfarin.   .  Tubular adenoma 12/19/2017   Endoscopically excised 04/03/2007.  Marland Kitchen Urge incontinence of urine 08/31/2015   Possibly secondary to prior CVA.  Treated with Myrbetriq.   Social History   Socioeconomic History  . Marital status: Single    Spouse name: Not on file  . Number of children: 3  . Years of education: 25  . Highest education level: Not on file  Occupational History  . Occupation: Retired  Tobacco Use  . Smoking status: Former Smoker    Types: Cigarettes    Quit date: 06/28/2007    Years since quitting: 13.3  . Smokeless tobacco: Never Used  Vaping Use  . Vaping Use: Never used  Substance and Sexual Activity  . Alcohol use: Yes    Alcohol/week: 0.0 standard drinks    Comment: Rarely.  . Drug use: No  . Sexual activity: Not on file  Other Topics Concern  . Not on file  Social History Narrative   Born in [place] but has been in the [place] for > 30 years.  Divorced, 1 daughter and 2 sons.  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   Right handed   Coffee/tea sometimes, soda rarely   Social Determinants of Health   Financial Resource Strain: Not on file  Food Insecurity: Not on file  Transportation Needs: Not on file  Physical Activity: Not on file  Stress: Not on file  Social Connections: Not on file   Family History  Problem Relation Age of Onset  . Stroke Maternal Grandmother   . Unexplained death Mother   . Depression Mother        After husband's death  . Unexplained death Father   . Osteoarthritis Father        Knees  . Early death Sister   . Seizures Sister   . Early death Brother 24       Unknown cause  . Healthy Daughter   . Healthy Son   . Stroke Maternal Aunt   . Healthy Sister   . Healthy Sister   . Unexplained death Brother   . Healthy Brother   . Healthy Son     ASSESSMENT Recent Results: The most recent result is correlated with 27.5 mg per week: Lab Results  Component Value Date   INR 2.7  10/15/2020   INR 2.6 09/07/2020   INR 2.3 07/27/2020    Anticoagulation Dosing: Description   Take one (1) tablet of your 5mg  peach-colored warfarin tablets by mouth, once-daily except on Mondays, Wednesdays and Fridays,  take 1/2 tablet at [facility].         INR today: Therapeutic  PLAN Weekly dose was unchanged.   Patient Instructions  Patient instructed to take medications as defined in the Anti-coagulation Track section of this encounter.  Patient instructed to take today's dose.  Patient instructed to take one (1) tablet of your 5mg  peach-colored warfarin tablets  by mouth, once-daily except on Mondays, Wednesdays and Fridays,  take 1/2 tablet at [facility].  Patient verbalized understanding of these instructions.    Patient advised to contact clinic or seek medical attention if signs/symptoms of bleeding or thromboembolism occur.  Patient verbalized understanding by repeating back information and was advised to contact me if further medication-related questions arise. Patient was also provided an information handout.  Follow-up Return in 7 weeks (on 11/30/2020) for Follow up INR.  [name], PharmD, CPP  15 minutes spent face-to-face with the patient during the encounter. 50% of time spent on education, including signs/sx bleeding and clotting, as well as food and drug interactions with warfarin. 50% of time was spent on fingerprick POC INR sample collection,processing, results determination, and documentation in http://www.kim.net/.

## 2020-10-16 LAB — CMP14 + ANION GAP
ALT: 25 IU/L (ref 0–44)
AST: 21 IU/L (ref 0–40)
Albumin/Globulin Ratio: 1.3 (ref 1.2–2.2)
Albumin: 4.5 g/dL (ref 3.8–4.8)
Alkaline Phosphatase: 83 IU/L (ref 44–121)
Anion Gap: 19 mmol/L — ABNORMAL HIGH (ref 10.0–18.0)
BUN/Creatinine Ratio: 12 (ref 10–24)
BUN: 11 mg/dL (ref 8–27)
Bilirubin Total: 0.4 mg/dL (ref 0.0–1.2)
CO2: 18 mmol/L — ABNORMAL LOW (ref 20–29)
Calcium: 10 mg/dL (ref 8.6–10.2)
Chloride: 106 mmol/L (ref 96–106)
Creatinine, Ser: 0.9 mg/dL (ref 0.76–1.27)
Globulin, Total: 3.5 g/dL (ref 1.5–4.5)
Glucose: 95 mg/dL (ref 65–99)
Potassium: 4.3 mmol/L (ref 3.5–5.2)
Sodium: 143 mmol/L (ref 134–144)
Total Protein: 8 g/dL (ref 6.0–8.5)
eGFR: 95 mL/min/{1.73_m2} (ref >59)

## 2020-10-16 LAB — URINALYSIS, ROUTINE W REFLEX MICROSCOPIC
Bilirubin, UA: NEGATIVE
Glucose, UA: NEGATIVE
Ketones, UA: NEGATIVE
Leukocytes,UA: NEGATIVE
Nitrite, UA: NEGATIVE
Protein,UA: NEGATIVE
RBC, UA: NEGATIVE
Specific Gravity, UA: 1.017 (ref 1.005–1.030)
Urobilinogen, Ur: 0.2 mg/dL (ref 0.2–1.0)
pH, UA: 5.5 (ref 5.0–7.5)

## 2020-10-16 LAB — HEMOGLOBIN A1C
Est. average glucose Bld gHb Est-mCnc: 123 mg/dL
Hgb A1c MFr Bld: 5.9 % — ABNORMAL HIGH (ref 4.8–5.6)

## 2020-10-20 DIAGNOSIS — H04123 Dry eye syndrome of bilateral lacrimal glands: Secondary | ICD-10-CM | POA: Diagnosis not present

## 2020-10-20 DIAGNOSIS — H40023 Open angle with borderline findings, high risk, bilateral: Secondary | ICD-10-CM | POA: Diagnosis not present

## 2020-11-03 ENCOUNTER — Other Ambulatory Visit: Payer: Self-pay | Admitting: Internal Medicine

## 2020-11-03 DIAGNOSIS — G252 Other specified forms of tremor: Secondary | ICD-10-CM

## 2020-11-18 ENCOUNTER — Telehealth: Payer: Self-pay | Admitting: *Deleted

## 2020-11-18 NOTE — Telephone Encounter (Signed)
Patient called in stating he had a "hard" BM yesterday and he noted blood on tissue with first time wiping. No blood noted with second wipe. No blood in toilet bowl. States he cannot tell the color of stool as he is color blind. Colonoscopy 2 years ago showed small internal hemorrhoids. Last INR 2.7 on 10/15/20; next INR check 6/6. Patient has already taken today's doses of ASA and coumadin. Please advise.

## 2020-11-18 NOTE — Telephone Encounter (Signed)
Notified patient of below. Also, discussed keeping well hydrated, eating more fiber, and getting enough physical activity to help decrease constipation. States he knows he is not drinking enough water but will try to increase his intake. He will keep his appt on 6/6 for coumadin check and will notify us if he develops any other abnormal bleeding.

## 2020-11-18 NOTE — Telephone Encounter (Signed)
Ok noted, I think it would be appropriate to monitor for now, but sounds like small hemorrhoidal bleeding due to constipation.

## 2020-11-25 ENCOUNTER — Ambulatory Visit (INDEPENDENT_AMBULATORY_CARE_PROVIDER_SITE_OTHER): Payer: Medicare HMO | Admitting: Podiatry

## 2020-11-25 ENCOUNTER — Other Ambulatory Visit: Payer: Self-pay

## 2020-11-25 DIAGNOSIS — J449 Chronic obstructive pulmonary disease, unspecified: Secondary | ICD-10-CM | POA: Diagnosis not present

## 2020-11-25 DIAGNOSIS — L603 Nail dystrophy: Secondary | ICD-10-CM

## 2020-11-25 DIAGNOSIS — L600 Ingrowing nail: Secondary | ICD-10-CM | POA: Diagnosis not present

## 2020-11-26 ENCOUNTER — Encounter: Payer: Self-pay | Admitting: Podiatry

## 2020-11-26 NOTE — Progress Notes (Signed)
Subjective:  Patient ID: Alexander English, male    DOB: [birth year],  MRN: [id number]  Chief Complaint  Patient presents with  . Ingrown Toenail    Left hallux     65 y.o. male presents with the above complaint.  Patient presents with a complaint of left hallux medial border ingrown.  Patient states that he had the lateral border taken him by me last time.  He states that is doing well.  He would like to take the medial border out as well as giving him a lot of pain.  He has not seen anyone else prior to seeing me does not currently infected.   Review of Systems: Negative except as noted in the HPI. Denies N/V/F/Ch.  Past Medical History:  Diagnosis Date  . Aortic atherosclerosis ([place]) 10/23/2017   Asymptomatic, seen on CT scan  . Bilateral cataracts 05/18/2016   Not visually significant  . Cerebrovascular accident (CVA) ([place]) 03/27/2020  . Chronic constipation 06/12/2013  . Chronic low back pain 06/12/2013   L4-5 facet arthropathy   . Chronic pain of both shoulders 12/07/2013   A/C joint osteoarthritis   . Cluster headaches    Resolved in 2006  . COPD (chronic obstructive pulmonary disease) ([place])   . Dry eye syndrome, bilateral 05/18/2016  . Embolic stroke involving right middle cerebral artery ([place]) 08/08/2004   Occured after traveling from [place] to [place] where he was hospitalized for 1 month with no further work-up or therapy.  Returned to [place] unable to walk and was admitted to [facility] immediately after landing. Presumed embolic per neuro but TEE, bubble study, and hypercoag W/U negative. On indefinite coumadin per patient's informed preference.  Residual effects : Left spastic hemiplegia. Tx with phenol tibi  . Generalized anxiety disorder 08/03/2018  . History of kidney stones   . Hyperplastic colon polyp 07/27/2012   Excised endoscopically 07/27/2012  . Hypertensive retinopathy of both eyes 05/18/2016  . Internal and external hemorrhoids without complication 11/12/8414   Seen on  colonoscopy 04/03/2007.  Marland Kitchen Left nephrolithiasis 08/31/2015   With mild left hydronephrosis.  Scheduled to undergo shockwave lithotripsy.  . Obesity (BMI 30.0-34.9) 12/07/2013  . Osteoarthritis of both knees 01/26/2012  . Positive PPD 12/07/2013  . Pulmonary embolism ([place]) 09/30/2004   Diagnosed in April 2006 after returning from [place].  Likely provoked as it occurred after a 12 hour plane flight within 2 months of R MCA CVA with left hemiparesis. Patient has made an informed decision to remain on lifelong warfarin.   . Tubular adenoma 12/19/2017   Endoscopically excised 04/03/2007.  Marland Kitchen Urge incontinence of urine 08/31/2015   Possibly secondary to prior CVA.  Treated with Myrbetriq.    Current Outpatient Medications:  .  aspirin EC 81 MG tablet, Take 81 mg by mouth daily., Disp: , Rfl:  .  atorvastatin (LIPITOR) 10 MG tablet, Take 1 tablet (10 mg total) by mouth daily., Disp: 90 tablet, Rfl: 3 .  fesoterodine (TOVIAZ) 8 MG TB24 tablet, Take 1 tablet (8 mg total) by mouth daily., Disp: 90 tablet, Rfl: 3 .  Multiple Vitamin (MULTIVITAMIN) tablet, Take 1 tablet by mouth daily., Disp: , Rfl:  .  MYRBETRIQ 25 MG TB24 tablet, Take 25 mg by mouth daily., Disp: , Rfl:  .  oxyCODONE-acetaminophen (PERCOCET/ROXICET) 5-325 MG tablet, Take 1 tablet by mouth every 12 (twelve) hours as needed for severe pain., Disp: 60 tablet, Rfl: 0 .  propranolol ER (INDERAL LA) 60 MG 24 hr capsule, Take 1 capsule  by mouth once daily, Disp: 90 capsule, Rfl: 1 .  sertraline (ZOLOFT) 50 MG tablet, Take 1 tablet (50 mg total) by mouth daily., Disp: 90 tablet, Rfl: 3 .  tamsulosin (FLOMAX) 0.4 MG CAPS capsule, , Disp: , Rfl:  .  warfarin (COUMADIN) 5 MG tablet, TAKE 1 TABLET BY MOUTH ONCE DAILY AT 6 IN THE EVENING EXCEPT ON Monday and Thursday TAKE 1/2 TABLET, Disp: 100 tablet, Rfl: 0  Social History   Tobacco Use  Smoking Status Former Smoker  . Types: Cigarettes  . Quit date: 06/28/2007  . Years since quitting: 13.4  Smokeless  Tobacco Never Used    No Known Allergies Objective:  There were no vitals filed for this visit. There is no height or weight on file to calculate BMI. Constitutional Well developed. Well nourished.  Vascular Dorsalis pedis pulses palpable bilaterally. Posterior tibial pulses palpable bilaterally. Capillary refill normal to all digits.  No cyanosis or clubbing noted. Pedal hair growth normal.  Neurologic Normal speech. Oriented to person, place, and time. Epicritic sensation to light touch grossly present bilaterally.  Dermatologic Painful ingrowing nail at medial nail borders of the hallux nail left. No other open wounds. No skin lesions.  Orthopedic: Normal joint ROM without pain or crepitus bilaterally. No visible deformities. No bony tenderness.   Radiographs: None Assessment:   1. Ingrown nail of great toe of left foot   2. Nail dystrophy    Plan:  Patient was evaluated and treated and all questions answered.  Ingrown Nail, left with underlying nail dystrophy -Patient elects to proceed with minor surgery to remove ingrown toenail removal today. Consent reviewed and signed by patient. -Ingrown nail excised. See procedure note. -Educated on post-procedure care including soaking. Written instructions provided and reviewed. -Patient to follow up in 2 weeks for nail check.  Procedure: Excision of Ingrown Toenail Location: Left 1st toe medial nail borders. Anesthesia: Lidocaine 1% plain; 1.5 mL and Marcaine 0.5% plain; 1.5 mL, digital block. Skin Prep: Betadine. Dressing: Silvadene; telfa; dry, sterile, compression dressing. Technique: Following skin prep, the toe was exsanguinated and a tourniquet was secured at the base of the toe. The affected nail border was freed, split with a nail splitter, and excised. Chemical matrixectomy was then performed with phenol and irrigated out with alcohol. The tourniquet was then removed and sterile dressing applied. Disposition: Patient  tolerated procedure well. Patient to return in 2 weeks for follow-up.   No follow-ups on file.

## 2020-11-30 ENCOUNTER — Other Ambulatory Visit: Payer: Self-pay | Admitting: Internal Medicine

## 2020-11-30 ENCOUNTER — Ambulatory Visit (INDEPENDENT_AMBULATORY_CARE_PROVIDER_SITE_OTHER): Payer: Medicare HMO | Admitting: Pharmacist

## 2020-11-30 DIAGNOSIS — Z86718 Personal history of other venous thrombosis and embolism: Secondary | ICD-10-CM | POA: Diagnosis not present

## 2020-11-30 DIAGNOSIS — K921 Melena: Secondary | ICD-10-CM | POA: Diagnosis not present

## 2020-11-30 LAB — POCT INR: INR: 2.6 (ref 2.0–3.0)

## 2020-11-30 NOTE — Progress Notes (Signed)
Anticoagulation Management Alexander English is a 65 y.o. male who reports to the clinic for monitoring of warfarin treatment.    Indication: DVT  Duration: indefinite Supervising physician: Dorian Pod MD  Anticoagulation Clinic Visit History: Patient does report signs/symptoms of bleeding or thromboembolism - patient reports BRBPR x1 in small amount on tissue after bowel movement on 11/18/20 Other recent changes: no changes to diet, medications, lifestyle Anticoagulation Episode Summary    Current INR goal:  2.0-3.0  TTR:  81.8 % (8.7 y)  Next INR check:  01/11/2021  INR from last check:  2.6 (11/30/2020)  Weekly max warfarin dose:    Target end date:  Indefinite  INR check location:  Anticoagulation Clinic  Preferred lab:    Send INR reminders to:  [facility]   Indications   History of venous thromboembolism [Z86.718]       Comments:          No Known Allergies  Current Outpatient Medications:  .  aspirin EC 81 MG tablet, Take 81 mg by mouth daily., Disp: , Rfl:  .  atorvastatin (LIPITOR) 10 MG tablet, Take 1 tablet (10 mg total) by mouth daily., Disp: 90 tablet, Rfl: 3 .  fesoterodine (TOVIAZ) 8 MG TB24 tablet, Take 1 tablet (8 mg total) by mouth daily., Disp: 90 tablet, Rfl: 3 .  Multiple Vitamin (MULTIVITAMIN) tablet, Take 1 tablet by mouth daily., Disp: , Rfl:  .  MYRBETRIQ 25 MG TB24 tablet, Take 25 mg by mouth daily., Disp: , Rfl:  .  oxyCODONE-acetaminophen (PERCOCET/ROXICET) 5-325 MG tablet, Take 1 tablet by mouth every 12 (twelve) hours as needed for severe pain., Disp: 60 tablet, Rfl: 0 .  propranolol ER (INDERAL LA) 60 MG 24 hr capsule, Take 1 capsule by mouth once daily, Disp: 90 capsule, Rfl: 1 .  sertraline (ZOLOFT) 50 MG tablet, Take 1 tablet (50 mg total) by mouth daily., Disp: 90 tablet, Rfl: 3 .  tamsulosin (FLOMAX) 0.4 MG CAPS capsule, , Disp: , Rfl:  .  warfarin (COUMADIN) 5 MG tablet, TAKE 1 TABLET BY MOUTH ONCE DAILY AT 6 IN THE EVENING EXCEPT ON  Monday and Thursday TAKE 1/2 TABLET, Disp: 100 tablet, Rfl: 0 Past Medical History:  Diagnosis Date  . Aortic atherosclerosis ([place]) 10/23/2017   Asymptomatic, seen on CT scan  . Bilateral cataracts 05/18/2016   Not visually significant  . Cerebrovascular accident (CVA) ([place]) 03/27/2020  . Chronic constipation 06/12/2013  . Chronic low back pain 06/12/2013   L4-5 facet arthropathy   . Chronic pain of both shoulders 12/07/2013   A/C joint osteoarthritis   . Cluster headaches    Resolved in 2006  . COPD (chronic obstructive pulmonary disease) ([place])   . Dry eye syndrome, bilateral 05/18/2016  . Embolic stroke involving right middle cerebral artery ([place]) 08/08/2004   Occured after traveling from [place] to [place] where he was hospitalized for 1 month with no further work-up or therapy.  Returned to [place] unable to walk and was admitted to [facility] immediately after landing. Presumed embolic per neuro but TEE, bubble study, and hypercoag W/U negative. On indefinite coumadin per patient's informed preference.  Residual effects : Left spastic hemiplegia. Tx with phenol tibi  . Generalized anxiety disorder 08/03/2018  . History of kidney stones   . Hyperplastic colon polyp 07/27/2012   Excised endoscopically 07/27/2012  . Hypertensive retinopathy of both eyes 05/18/2016  . Internal and external hemorrhoids without complication 10/30/3974   Seen on colonoscopy 04/03/2007.  Marland Kitchen Left  nephrolithiasis 08/31/2015   With mild left hydronephrosis.  Scheduled to undergo shockwave lithotripsy.  . Obesity (BMI 30.0-34.9) 12/07/2013  . Osteoarthritis of both knees 01/26/2012  . Positive PPD 12/07/2013  . Pulmonary embolism ([place]) 09/30/2004   Diagnosed in April 2006 after returning from [place].  Likely provoked as it occurred after a 12 hour plane flight within 2 months of R MCA CVA with left hemiparesis. Patient has made an informed decision to remain on lifelong warfarin.   . Tubular adenoma 12/19/2017   Endoscopically excised  04/03/2007.  Marland Kitchen Urge incontinence of urine 08/31/2015   Possibly secondary to prior CVA.  Treated with Myrbetriq.   Social History   Socioeconomic History  . Marital status: Single    Spouse name: Not on file  . Number of children: 3  . Years of education: 37  . Highest education level: Not on file  Occupational History  . Occupation: Retired  Tobacco Use  . Smoking status: Former Smoker    Types: Cigarettes    Quit date: 06/28/2007    Years since quitting: 13.4  . Smokeless tobacco: Never Used  Vaping Use  . Vaping Use: Never used  Substance and Sexual Activity  . Alcohol use: Yes    Alcohol/week: 0.0 standard drinks    Comment: Rarely.  . Drug use: No  . Sexual activity: Not on file  Other Topics Concern  . Not on file  Social History Narrative   Born in [place] but has been in the [place] for > 30 years.  Divorced, 1 daughter and 2 sons.  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   Right handed   Coffee/tea sometimes, soda rarely   Social Determinants of Health   Financial Resource Strain: Not on file  Food Insecurity: Not on file  Transportation Needs: Not on file  Physical Activity: Not on file  Stress: Not on file  Social Connections: Not on file   Family History  Problem Relation Age of Onset  . Stroke Maternal Grandmother   . Unexplained death Mother   . Depression Mother        After husband's death  . Unexplained death Father   . Osteoarthritis Father        Knees  . Early death Sister   . Seizures Sister   . Early death Brother 23       Unknown cause  . Healthy Daughter   . Healthy Son   . Stroke Maternal Aunt   . Healthy Sister   . Healthy Sister   . Unexplained death Brother   . Healthy Brother   . Healthy Son     ASSESSMENT Recent Results: The most recent result is correlated with 27.5  mg per week: Lab Results  Component Value Date   INR 2.6 11/30/2020   INR 2.7 10/15/2020   INR 2.6 09/07/2020     Anticoagulation Dosing: Description   Take one (1) tablet of your 5mg  peach-colored warfarin tablets by mouth, once-daily except on Mondays, Wednesdays and Fridays,  take 1/2 tablet at [facility].         INR today: Therapeutic  PLAN Weekly dose was unchanged  Patient Instructions  Patient instructed to take medications as defined in the Anti-coagulation Track section of this encounter.  Patient instructed to take today's dose.  Patient instructed to take one (1) tablet of your 5mg  peach-colored warfarin tablets by mouth, once-daily except on Mondays, Wednesdays and Fridays,  take 1/2 tablet at [facility].   Patient verbalized understanding of these instructions.     Patient advised to contact clinic or seek medical attention if signs/symptoms of bleeding or thromboembolism occur.  Patient verbalized understanding by repeating back information and was advised to contact me if further medication-related questions arise. Patient was also provided an information handout.  Follow-up Return in 6 weeks (on 01/11/2021) for Follow up INR at 10:00 AM.  Wilson Singer, PharmD PGY1 Pharmacy Resident 11/30/2020 9:51 AM  15 minutes spent face-to-face with the patient during the encounter. 50% of time spent on education, including signs/sx bleeding and clotting, as well as food and drug interactions with warfarin. 50% of time was spent on fingerprick POC INR sample collection,processing, results determination, and documentation in [email or web address].[email or web address]/.

## 2020-11-30 NOTE — Patient Instructions (Signed)
Patient instructed to take medications as defined in the Anti-coagulation Track section of this encounter.  Patient instructed to take today's dose.  Patient instructed to take one (1) tablet of your 5mg  peach-colored warfarin tablets by mouth, once-daily except on Mondays, Wednesdays and Fridays,  take 1/2 tablet at [facility].   Patient verbalized understanding of these instructions.

## 2020-12-01 DIAGNOSIS — N401 Enlarged prostate with lower urinary tract symptoms: Secondary | ICD-10-CM | POA: Diagnosis not present

## 2020-12-01 DIAGNOSIS — R310 Gross hematuria: Secondary | ICD-10-CM | POA: Diagnosis not present

## 2020-12-01 DIAGNOSIS — R3915 Urgency of urination: Secondary | ICD-10-CM | POA: Diagnosis not present

## 2020-12-01 DIAGNOSIS — N5201 Erectile dysfunction due to arterial insufficiency: Secondary | ICD-10-CM | POA: Diagnosis not present

## 2020-12-01 DIAGNOSIS — R972 Elevated prostate specific antigen [PSA]: Secondary | ICD-10-CM | POA: Diagnosis not present

## 2020-12-01 LAB — HEMOGLOBIN AND HEMATOCRIT, BLOOD
Hematocrit: 42.6 % (ref 37.5–51.0)
Hemoglobin: 13.6 g/dL (ref 13.0–17.7)

## 2020-12-01 NOTE — Progress Notes (Signed)
CC: blood on toilet paper, possible hematuria  HPI:  [name] is a 65 y.o. man with history as below who presents to clinic for evaluation after blood on toilet paper and possible hematuria. His last clinic visit was on 10/16/20 with his PCP, Dr. Heber English.   To see the details of this patient's management of their acute and chronic problems, please refer to the Assessment & Plan under the Encounters tab.    Past Medical History:  Diagnosis Date  . Aortic atherosclerosis ([place]) 10/23/2017   Asymptomatic, seen on CT scan  . Bilateral cataracts 05/18/2016   Not visually significant  . Cerebrovascular accident (CVA) ([place]) 03/27/2020  . Chronic constipation 06/12/2013  . Chronic low back pain 06/12/2013   L4-5 facet arthropathy   . Chronic pain of both shoulders 12/07/2013   A/C joint osteoarthritis   . Cluster headaches    Resolved in 2006  . COPD (chronic obstructive pulmonary disease) ([place])   . Dry eye syndrome, bilateral 05/18/2016  . Embolic stroke involving right middle cerebral artery ([place]) 08/08/2004   Occured after traveling from [place] to [place] where he was hospitalized for 1 month with no further work-up or therapy.  Returned to [place] unable to walk and was admitted to [facility] immediately after landing. Presumed embolic per neuro but TEE, bubble study, and hypercoag W/U negative. On indefinite coumadin per patient's informed preference.  Residual effects : Left spastic hemiplegia. Tx with phenol tibi  . Generalized anxiety disorder 08/03/2018  . History of kidney stones   . Hyperplastic colon polyp 07/27/2012   Excised endoscopically 07/27/2012  . Hypertensive retinopathy of both eyes 05/18/2016  . Internal and external hemorrhoids without complication 12/21/348   Seen on colonoscopy 04/03/2007.  Marland Kitchen Left nephrolithiasis 08/31/2015   With mild left hydronephrosis.  Scheduled to undergo shockwave lithotripsy.  . Obesity (BMI 30.0-34.9) 12/07/2013  . Osteoarthritis of both knees  01/26/2012  . Positive PPD 12/07/2013  . Pulmonary embolism ([place]) 09/30/2004   Diagnosed in April 2006 after returning from [place].  Likely provoked as it occurred after a 12 hour plane flight within 2 months of R MCA CVA with left hemiparesis. Patient has made an informed decision to remain on lifelong warfarin.   . Tubular adenoma 12/19/2017   Endoscopically excised 04/03/2007.  Marland Kitchen Urge incontinence of urine 08/31/2015   Possibly secondary to prior CVA.  Treated with Myrbetriq.   Review of Systems:    Review of Systems  Constitutional:  Negative for chills, fever, malaise/fatigue and weight loss.  Respiratory:  Negative for shortness of breath.   Cardiovascular:  Negative for chest pain.  Gastrointestinal:  Positive for constipation. Negative for abdominal pain, diarrhea and melena.  Genitourinary:  Positive for urgency. Negative for dysuria and flank pain.  Musculoskeletal:  Positive for joint pain. Negative for falls and myalgias.  Neurological:  Positive for focal weakness. Negative for dizziness, weakness and headaches.   Physical Exam:  Vitals:   12/02/20 0846  BP: 124/69  Pulse: 60  Temp: 98.2 F (36.8 C)  TempSrc: Oral  SpO2: 100%  Weight: 242 lb 12.8 oz (110.1 kg)  Height: 6' (1.829 m)   Constitutional: very pleasant, well-appearing man sitting in chair, in no acute distress HENT: normocephalic atraumatic, mucous membranes moist Eyes: conjunctiva non-erythematous Neck: supple Cardiovascular: regular rate and rhythm, no m/r/g Pulmonary/Chest: normal work of breathing on room air, lungs clear to auscultation bilaterally Abdominal: soft, non-tender, non-distended MSK: normal bulk and tone; left leg in brace, pain  with left hip movement, no overlying warmth or erythema, exam limited by patient's inability to maneuver onto exam table  Neurological: alert & oriented x 3, antalgic gait with gain Skin: warm and dry Psych: normal mood and affect   Assessment & Plan:   See  Encounters Tab for problem-based charting.  Patient discussed with Dr. Jimmye Norman

## 2020-12-01 NOTE — Patient Instructions (Addendum)
MB.[email or web address],   Thank you for your visit to the [facility] today. It was a pleasure meeting you. Today we discussed the following:  1) Blood on toilet paper, possible blood in urine - Your blood counts were reassuringly normal on Monday - For your hard stools, recommend bowel regimen as needed. Options include Miralax, Senna, Metamucil.  - I'm glad you are following with urology about the possible blood in your urine. I will give their office a call and see what your urine showed. I can let you know what I find out.  2) For your hip pain, I am referring you to orthopedic specialists. Continue Tylenol and heating pad for now.   Keep your regularly scheduled appointment with your PCP and Dr. Elie Confer.    If you have any questions or concerns, please call our clinic at [phone] between 9am-5pm. Outside of these hours, call [phone] and ask for the internal medicine resident on call. If you feel you are having a medical emergency please call 911.

## 2020-12-02 ENCOUNTER — Encounter: Payer: Self-pay | Admitting: Student

## 2020-12-02 ENCOUNTER — Other Ambulatory Visit: Payer: Self-pay

## 2020-12-02 ENCOUNTER — Ambulatory Visit (INDEPENDENT_AMBULATORY_CARE_PROVIDER_SITE_OTHER): Payer: Medicare HMO | Admitting: Student

## 2020-12-02 VITALS — BP 124/69 | HR 60 | Temp 98.2°F | Ht 72.0 in | Wt 242.8 lb

## 2020-12-02 DIAGNOSIS — M25552 Pain in left hip: Secondary | ICD-10-CM

## 2020-12-02 DIAGNOSIS — K625 Hemorrhage of anus and rectum: Secondary | ICD-10-CM

## 2020-12-02 DIAGNOSIS — N200 Calculus of kidney: Secondary | ICD-10-CM

## 2020-12-02 DIAGNOSIS — M160 Bilateral primary osteoarthritis of hip: Secondary | ICD-10-CM | POA: Diagnosis not present

## 2020-12-02 NOTE — Progress Notes (Signed)
Discussed with Dr. Elie Confer patient's report of BRBPR a number of days prior.  INR therapeutic.  Will schedule patient 12/02/20 to further explore symptoms. H/H reassuring, he is otherwise asymptomatic. No change in anticoagulation at this time.

## 2020-12-03 DIAGNOSIS — K625 Hemorrhage of anus and rectum: Secondary | ICD-10-CM | POA: Insufficient documentation

## 2020-12-03 NOTE — Assessment & Plan Note (Deleted)
Patient reports his left hip continues to bother him, and he states he is concerned because he feels he has become even weaker with hip movement on the left. States his pain is located at the hip and radiates into his left groin. States Tylenol has been minimally helpful and he does not wish to use Percocet which was previously prescribed. Bilateral hip xrays from ~1 year ago showed no progression in his hip arthritis since 2015. Exam notable for pain with any left hip movement.   A/P: Given his persistent pain despite conservative management, will refer to orthopedics for possible intervention. - Referral to orthopedic surgery

## 2020-12-03 NOTE — Assessment & Plan Note (Addendum)
Patient reports he may have had an episode of hematuria the day of his last INR check, 2 days ago on 11/30/20. He states he is colorblind so cannot be sure if his urine was pink or red but states it did appear darker. He denies dysuria. Reports intermittent abdominal pain which radiates to his groin, but he is not confident the pain is similar to previous kidney stone vs pain related to his hip arthritis. U/A on 10/15/20 was negative for hematuria.   Patient reports he was seen by his urologist yesterday and a urine sample was obtained but he has not been informed of the result. He asks whether it would be appropriate to start taking the tamsulosin he was previously prescribed.  Plan: Since patient is following with urology ([facility] - Rexene Alberts, MD), will defer further work-up for possible stone to them. Patient is scheduled to follow-up with his urologist in 2-3 weeks. I will call [facility] to obtain results of urine studies to share with patient.

## 2020-12-03 NOTE — Assessment & Plan Note (Signed)
Patient reports his left hip continues to bother him, and he states he is concerned because he feels he has become even weaker with hip movement on the left. States his pain is located at the hip and radiates into his left groin. States Tylenol has been minimally helpful and he does not wish to use Percocet which was previously prescribed. Bilateral hip xrays from ~1 year ago showed no progression in his hip arthritis since 2015. Exam notable for pain with any left hip movement.   A/P: Given his persistent pain despite conservative management, will refer to orthopedics for possible intervention. - Referral to orthopedic surgery

## 2020-12-03 NOTE — Assessment & Plan Note (Addendum)
Patient presents for evaluation following report of bright red blood on his tissue paper after wiping on 11/18/20. He called [facility] to report and reported it occurred 1x and was immediately after straining to have a hard bowel movement. It has since not recurred. When he presented for his 11/30/20 INR check in warfarin clinic, his H&H was checked and was stable at 13.6 and 42.6. Of note, patient is color blind, however he states he can ascertain bright red blood based on the shade. He denies pain with defecation. He has a history of chronic constipation and has intermittently required a bowel regimen. States Miralax has not helped, however he plans on trying Metamucil.   A/P: Differential includes hemorrhoids, which were noted on patient's colonoscopy previously, or small anal fissure, though patient denies pain. His stable hemoglobin is reassuring.  - Counseled patient on options for bowel regimens to minimize straining/tearing with defecation

## 2020-12-04 ENCOUNTER — Ambulatory Visit: Payer: Medicare HMO | Admitting: Podiatry

## 2020-12-04 NOTE — Progress Notes (Signed)
Internal Medicine Clinic Attending  Case discussed with Dr. Shon Baton  At the time of the visit.  We reviewed the resident's history and exam and pertinent patient test results.  I agree with the assessment, diagnosis, and plan of care documented in the resident's note.  As noted, no worrisome episodes of bleeding; safe to continue anticoagulation at current goals.

## 2020-12-08 DIAGNOSIS — M545 Low back pain, unspecified: Secondary | ICD-10-CM | POA: Diagnosis not present

## 2020-12-08 DIAGNOSIS — M5442 Lumbago with sciatica, left side: Secondary | ICD-10-CM | POA: Diagnosis not present

## 2020-12-08 DIAGNOSIS — M1712 Unilateral primary osteoarthritis, left knee: Secondary | ICD-10-CM | POA: Diagnosis not present

## 2020-12-08 DIAGNOSIS — M25552 Pain in left hip: Secondary | ICD-10-CM | POA: Diagnosis not present

## 2020-12-15 DIAGNOSIS — M545 Low back pain, unspecified: Secondary | ICD-10-CM | POA: Diagnosis not present

## 2020-12-24 DIAGNOSIS — M7062 Trochanteric bursitis, left hip: Secondary | ICD-10-CM | POA: Diagnosis not present

## 2020-12-29 ENCOUNTER — Encounter: Payer: Self-pay | Admitting: *Deleted

## 2020-12-29 DIAGNOSIS — R310 Gross hematuria: Secondary | ICD-10-CM | POA: Diagnosis not present

## 2020-12-29 DIAGNOSIS — N400 Enlarged prostate without lower urinary tract symptoms: Secondary | ICD-10-CM | POA: Diagnosis not present

## 2020-12-31 DIAGNOSIS — R310 Gross hematuria: Secondary | ICD-10-CM | POA: Diagnosis not present

## 2021-01-05 ENCOUNTER — Other Ambulatory Visit: Payer: Self-pay | Admitting: Urology

## 2021-01-05 DIAGNOSIS — N429 Disorder of prostate, unspecified: Secondary | ICD-10-CM

## 2021-01-07 ENCOUNTER — Other Ambulatory Visit: Payer: Self-pay | Admitting: Urology

## 2021-01-11 ENCOUNTER — Ambulatory Visit (INDEPENDENT_AMBULATORY_CARE_PROVIDER_SITE_OTHER): Payer: Medicare HMO | Admitting: Pharmacist

## 2021-01-11 DIAGNOSIS — Z7901 Long term (current) use of anticoagulants: Secondary | ICD-10-CM | POA: Diagnosis not present

## 2021-01-11 DIAGNOSIS — Z86718 Personal history of other venous thrombosis and embolism: Secondary | ICD-10-CM | POA: Diagnosis not present

## 2021-01-11 LAB — POCT INR: INR: 2.1 (ref 2.0–3.0)

## 2021-01-11 NOTE — Progress Notes (Signed)
Anticoagulation Management [name] English is a 65 y.o. male who reports to the clinic for monitoring of warfarin treatment.    Indication: DVT , History of; Long term current use of oral anticoagulant warfarin to maintain INR 2.0 - 3.0.  Duration: indefinite Supervising physician: Aldine Contes  Anticoagulation Clinic Visit History: Patient does not report signs/symptoms of bleeding or thromboembolism  Other recent changes: No diet, medications, lifestyle changes except as noted in patient findings.  Anticoagulation Episode Summary     Current INR goal:  2.0-3.0  TTR:  82.0 % (8.8 y)  Next INR check:  02/08/2021  INR from last check:  2.1 (01/11/2021)  Weekly max warfarin dose:    Target end date:  Indefinite  INR check location:  Anticoagulation Clinic  Preferred lab:    Send INR reminders to:  [facility]   Indications   History of venous thromboembolism [Z86.718]        Comments:           No Known Allergies  Current Outpatient Medications:    atorvastatin (LIPITOR) 10 MG tablet, Take 1 tablet (10 mg total) by mouth daily., Disp: 90 tablet, Rfl: 3   fesoterodine (TOVIAZ) 8 MG TB24 tablet, Take 1 tablet (8 mg total) by mouth daily., Disp: 90 tablet, Rfl: 3   MYRBETRIQ 25 MG TB24 tablet, Take 25 mg by mouth daily., Disp: , Rfl:    propranolol ER (INDERAL LA) 60 MG 24 hr capsule, Take 1 capsule by mouth once daily, Disp: 90 capsule, Rfl: 1   sertraline (ZOLOFT) 50 MG tablet, Take 1 tablet (50 mg total) by mouth daily., Disp: 90 tablet, Rfl: 3   tamsulosin (FLOMAX) 0.4 MG CAPS capsule, , Disp: , Rfl:    warfarin (COUMADIN) 5 MG tablet, TAKE 1 TABLET BY MOUTH ONCE DAILY AT 6 IN THE EVENING EXCEPT ON Monday and Thursday TAKE 1/2 TABLET, Disp: 100 tablet, Rfl: 0   aspirin EC 81 MG tablet, Take 81 mg by mouth daily. (Patient not taking: Reported on 01/11/2021), Disp: , Rfl:    Multiple Vitamin (MULTIVITAMIN) tablet, Take 1 tablet by mouth daily. (Patient not taking:  Reported on 01/11/2021), Disp: , Rfl:    oxyCODONE-acetaminophen (PERCOCET/ROXICET) 5-325 MG tablet, Take 1 tablet by mouth every 12 (twelve) hours as needed for severe pain. (Patient not taking: Reported on 01/11/2021), Disp: 60 tablet, Rfl: 0 Past Medical History:  Diagnosis Date   Aortic atherosclerosis ([place]) 10/23/2017   Asymptomatic, seen on CT scan   Bilateral cataracts 05/18/2016   Not visually significant   Cerebrovascular accident (CVA) (Alexander English) 03/27/2020   Chronic constipation 06/12/2013   Chronic low back pain 06/12/2013   L4-5 facet arthropathy    Chronic pain of both shoulders 12/07/2013   A/C joint osteoarthritis    Cluster headaches    Resolved in 2006   COPD (chronic obstructive pulmonary disease) (Alexander English)    Dry eye syndrome, bilateral 97/35/3299   Embolic stroke involving right middle cerebral artery (Alexander English) 08/08/2004   Occured after traveling from [place] to [place] where he was hospitalized for 1 month with no further work-up or therapy.  Returned to [place] unable to walk and was admitted to [facility] immediately after landing. Presumed embolic per neuro but TEE, bubble study, and hypercoag W/U negative. On indefinite coumadin per patient's informed preference.  Residual effects : Left spastic hemiplegia. Tx with phenol tibi   Generalized anxiety disorder 08/03/2018   History of kidney stones    Hyperplastic colon polyp 07/27/2012  Excised endoscopically 07/27/2012   Hypertensive retinopathy of both eyes 05/18/2016   Internal and external hemorrhoids without complication 0/16/0109   Seen on colonoscopy 04/03/2007.   Left nephrolithiasis 08/31/2015   With mild left hydronephrosis.  Scheduled to undergo shockwave lithotripsy.   Obesity (BMI 30.0-34.9) 12/07/2013   Osteoarthritis of both knees 01/26/2012   Positive PPD 12/07/2013   Pulmonary embolism ([place]) 09/30/2004   Diagnosed in April 2006 after returning from [place].  Likely provoked as it occurred after a 12 hour plane flight within 2 months of R  MCA CVA with left hemiparesis. Patient has made an informed decision to remain on lifelong warfarin.    Tubular adenoma 12/19/2017   Endoscopically excised 04/03/2007.   Urge incontinence of urine 08/31/2015   Possibly secondary to prior CVA.  Treated with Myrbetriq.   Social History   Socioeconomic History   Marital status: Single    Spouse name: Not on file   Number of children: 3   Years of education: 16   Highest education level: Not on file  Occupational History   Occupation: Retired  Tobacco Use   Smoking status: Former    Types: Cigarettes    Quit date: 06/28/2007    Years since quitting: 13.5   Smokeless tobacco: Never  Vaping Use   Vaping Use: Never used  Substance and Sexual Activity   Alcohol use: Yes    Alcohol/week: 0.0 standard drinks    Comment: Rarely.   Drug use: No   Sexual activity: Not on file  Other Topics Concern   Not on file  Social History Narrative   Born in [place] but has been in the [place] for > 30 years.  Divorced, 1 daughter and 2 sons.  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   Right handed   Coffee/tea sometimes, soda rarely   Social Determinants of Health   Financial Resource Strain: Not on file  Food Insecurity: Not on file  Transportation Needs: Not on file  Physical Activity: Not on file  Stress: Not on file  Social Connections: Not on file   Family History  Problem Relation Age of Onset   Stroke Maternal Grandmother    Unexplained death Mother    Depression Mother        After husband's death   Unexplained death Father    Osteoarthritis Father        Knees   Early death Sister    Seizures Sister    Early death Brother 48       Unknown cause   Healthy Daughter    Healthy Son    Stroke Maternal Aunt    Healthy Sister    Healthy Sister    Unexplained death Brother    Healthy Brother    Healthy Son     ASSESSMENT Recent Results: The most recent result is correlated with 27.5  mg per week: Lab Results  Component Value Date   INR 2.1 01/11/2021   INR 2.6 11/30/2020   INR 2.7 10/15/2020    Anticoagulation Dosing: Description   Take one (1) tablet of your 5mg  peach-colored warfarin tablets by mouth, once-daily except on Mondays, Wednesdays and Fridays,  take 1/2 tablet at [facility].         INR today: Therapeutic  PLAN Weekly dose was unchanged.   Patient Instructions  Patient instructed to take medications as defined in the Anti-coagulation Track section of this encounter.  Patient instructed to  take today's dose.  Patient instructed to take one (1) tablet of your 5mg  peach-colored warfarin tablets by mouth, once-daily except on Mondays, Wednesdays and Fridays,  take 1/2 tablet at [facility].   Patient verbalized understanding of these instructions.   Patient advised to contact clinic or seek medical attention if signs/symptoms of bleeding or thromboembolism occur.  Patient verbalized understanding by repeating back information and was advised to contact me if further medication-related questions arise. Patient was also provided an information handout.  Follow-up Return in 4 weeks (on 02/08/2021) for Follow up INR.  [name], PharmD, CPP  15 minutes spent face-to-face with the patient during the encounter. 50% of time spent on education, including signs/sx bleeding and clotting, as well as food and drug interactions with warfarin. 50% of time was spent on fingerprick POC INR sample collection,processing, results determination, and documentation in [email or web address]/.

## 2021-01-11 NOTE — Patient Instructions (Signed)
Patient instructed to take medications as defined in the Anti-coagulation Track section of this encounter.  Patient instructed to take today's dose.  Patient instructed to take one (1) tablet of your 5mg  peach-colored warfarin tablets by mouth, once-daily except on Mondays, Wednesdays and Fridays,  take 1/2 tablet at [facility].   Patient verbalized understanding of these instructions.

## 2021-01-13 NOTE — Progress Notes (Signed)
INTERNAL MEDICINE TEACHING ATTENDING ADDENDUM - Prescious Hurless M.D  Duration- indefinite, Indication- PE, INR- therapeutic. Agree with pharmacy recommendations as outlined in their note.     

## 2021-01-26 ENCOUNTER — Other Ambulatory Visit: Payer: Self-pay

## 2021-01-26 ENCOUNTER — Ambulatory Visit
Admission: RE | Admit: 2021-01-26 | Discharge: 2021-01-26 | Disposition: A | Discharge status: Home / Self Care | Payer: Medicare HMO | Source: Ambulatory Visit | Attending: Urology | Admitting: Urology

## 2021-01-26 DIAGNOSIS — R300 Dysuria: Secondary | ICD-10-CM | POA: Diagnosis not present

## 2021-01-26 DIAGNOSIS — N402 Nodular prostate without lower urinary tract symptoms: Secondary | ICD-10-CM | POA: Diagnosis not present

## 2021-01-26 DIAGNOSIS — N3289 Other specified disorders of bladder: Secondary | ICD-10-CM | POA: Diagnosis not present

## 2021-01-26 DIAGNOSIS — N429 Disorder of prostate, unspecified: Secondary | ICD-10-CM

## 2021-01-26 MED ORDER — GADOBENATE DIMEGLUMINE 529 MG/ML IV SOLN
20.0000 mL | Freq: Once | INTRAVENOUS | Status: AC | PRN
  Administered 2021-01-26: 20 mL via INTRAVENOUS

## 2021-02-08 ENCOUNTER — Ambulatory Visit (INDEPENDENT_AMBULATORY_CARE_PROVIDER_SITE_OTHER): Payer: Medicare HMO | Admitting: Pharmacist

## 2021-02-08 DIAGNOSIS — Z5181 Encounter for therapeutic drug level monitoring: Secondary | ICD-10-CM

## 2021-02-08 DIAGNOSIS — Z7901 Long term (current) use of anticoagulants: Secondary | ICD-10-CM

## 2021-02-08 DIAGNOSIS — Z86718 Personal history of other venous thrombosis and embolism: Secondary | ICD-10-CM

## 2021-02-08 LAB — POCT INR: INR: 2.1 (ref 2.0–3.0)

## 2021-02-08 NOTE — Patient Instructions (Signed)
Patient instructed to take medications as defined in the Anti-coagulation Track section of this encounter.  Patient instructed to take today's dose.  Patient instructed to take  one (1) tablet of your '5mg'$  peach-colored warfarin tablets by mouth, once-daily except on Wednesdays and Fridays,  take 1/2 tablet at [facility].  HOLD/OMIT warfarin as instructed by Urology for five (5) days prior to planned procedure. Stop aspirin for ten (10) days before procedure. RESUME these medications based upon instructions provided by your Urologic Surgeon/Provider after your surgery is completed.  Patient verbalized understanding of these instructions.

## 2021-02-08 NOTE — Progress Notes (Signed)
Anticoagulation Management Alexander English is a 65 y.o. male who reports to the clinic for monitoring of warfarin treatment.    Indication: CVA , history of, remote--but with sequelae; long term current use of warfarin oral anticoagulant to maintain INR 2.0 - 3.0.  Duration: indefinite Supervising physician: Aldine Contes  Anticoagulation Clinic Visit History: Patient does not report signs/symptoms of bleeding or thromboembolism   Other recent changes: No diet, medications, lifestyle except as noted in patient findings, planned surgical event on 29-AUG-22. Anticoagulation Episode Summary     Current INR goal:  2.0-3.0  TTR:  82.1 % (8.9 y)  Next INR check:  03/08/2021  INR from last check:  2.1 (02/08/2021)  Weekly max warfarin dose:    Target end date:  Indefinite  INR check location:  Anticoagulation Clinic  Preferred lab:    Send INR reminders to:  [facility]   Indications   History of venous thromboembolism [Z86.718]        Comments:           No Known Allergies  Current Outpatient Medications:    atorvastatin (LIPITOR) 10 MG tablet, Take 1 tablet (10 mg total) by mouth daily., Disp: 90 tablet, Rfl: 3   fesoterodine (TOVIAZ) 8 MG TB24 tablet, Take 1 tablet (8 mg total) by mouth daily., Disp: 90 tablet, Rfl: 3   MYRBETRIQ 25 MG TB24 tablet, Take 25 mg by mouth daily., Disp: , Rfl:    propranolol ER (INDERAL LA) 60 MG 24 hr capsule, Take 1 capsule by mouth once daily, Disp: 90 capsule, Rfl: 1   sertraline (ZOLOFT) 50 MG tablet, Take 1 tablet (50 mg total) by mouth daily., Disp: 90 tablet, Rfl: 3   tamsulosin (FLOMAX) 0.4 MG CAPS capsule, , Disp: , Rfl:    warfarin (COUMADIN) 5 MG tablet, TAKE 1 TABLET BY MOUTH ONCE DAILY AT 6 IN THE EVENING EXCEPT ON Monday and Thursday TAKE 1/2 TABLET, Disp: 100 tablet, Rfl: 0   aspirin EC 81 MG tablet, Take 81 mg by mouth daily. (Patient not taking: No sig reported), Disp: , Rfl:    Multiple Vitamin (MULTIVITAMIN) tablet, Take  1 tablet by mouth daily. (Patient not taking: No sig reported), Disp: , Rfl:    oxyCODONE-acetaminophen (PERCOCET/ROXICET) 5-325 MG tablet, Take 1 tablet by mouth every 12 (twelve) hours as needed for severe pain. (Patient not taking: No sig reported), Disp: 60 tablet, Rfl: 0 Past Medical History:  Diagnosis Date   Aortic atherosclerosis ([place]) 10/23/2017   Asymptomatic, seen on CT scan   Bilateral cataracts 05/18/2016   Not visually significant   Cerebrovascular accident (CVA) ([place]) 03/27/2020   Chronic constipation 06/12/2013   Chronic low back pain 06/12/2013   L4-5 facet arthropathy    Chronic pain of both shoulders 12/07/2013   A/C joint osteoarthritis    Cluster headaches    Resolved in 2006   COPD (chronic obstructive pulmonary disease) ([place])    Dry eye syndrome, bilateral [id number]   Embolic stroke involving right middle cerebral artery ([place]) 08/08/2004   Occured after traveling from [place] to [place] where he was hospitalized for 1 month with no further work-up or therapy.  Returned to [place] unable to walk and was admitted to [facility] immediately after landing. Presumed embolic per neuro but TEE, bubble study, and hypercoag W/U negative. On indefinite coumadin per patient's informed preference.  Residual effects : Left spastic hemiplegia. Tx with phenol tibi   Generalized anxiety disorder 08/03/2018   History of kidney stones  Hyperplastic colon polyp 07/27/2012   Excised endoscopically 07/27/2012   Hypertensive retinopathy of both eyes 05/18/2016   Internal and external hemorrhoids without complication 0000000   Seen on colonoscopy 04/03/2007.   Left nephrolithiasis 08/31/2015   With mild left hydronephrosis.  Scheduled to undergo shockwave lithotripsy.   Obesity (BMI 30.0-34.9) 12/07/2013   Osteoarthritis of both knees 01/26/2012   Positive PPD 12/07/2013   Pulmonary embolism ([place]) 09/30/2004   Diagnosed in April 2006 after returning from [place].  Likely provoked as it occurred after a 12 hour  plane flight within 2 months of R MCA CVA with left hemiparesis. Patient has made an informed decision to remain on lifelong warfarin.    Tubular adenoma 12/19/2017   Endoscopically excised 04/03/2007.   Urge incontinence of urine 08/31/2015   Possibly secondary to prior CVA.  Treated with Myrbetriq.   Social History   Socioeconomic History   Marital status: Single    Spouse name: Not on file   Number of children: 3   Years of education: 16   Highest education level: Not on file  Occupational History   Occupation: Retired  Tobacco Use   Smoking status: Former    Types: Cigarettes    Quit date: 06/28/2007    Years since quitting: 13.6   Smokeless tobacco: Never  Vaping Use   Vaping Use: Never used  Substance and Sexual Activity   Alcohol use: Yes    Alcohol/week: 0.0 standard drinks    Comment: Rarely.   Drug use: No   Sexual activity: Not on file  Other Topics Concern   Not on file  Social History Narrative   Born in [place] but has been in the [place] for > 30 years.  Divorced, 1 daughter and 2 sons.  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   Right handed   Coffee/tea sometimes, soda rarely   Social Determinants of Health   Financial Resource Strain: Not on file  Food Insecurity: Not on file  Transportation Needs: Not on file  Physical Activity: Not on file  Stress: Not on file  Social Connections: Not on file   Family History  Problem Relation Age of Onset   Stroke Maternal Grandmother    Unexplained death Mother    Depression Mother        After husband's death   Unexplained death Father    Osteoarthritis Father        Knees   Early death Sister    Seizures Sister    Early death Brother 37       Unknown cause   Healthy Daughter    Healthy Son    Stroke Maternal Aunt    Healthy Sister    Healthy Sister    Unexplained death Brother    Healthy Brother    Healthy Son     ASSESSMENT Recent Results: The most  recent result is correlated with 27.5 mg per week: Lab Results  Component Value Date   INR 2.1 02/08/2021   INR 2.1 01/11/2021   INR 2.6 11/30/2020    Anticoagulation Dosing: Description   Take one (1) tablet of your '5mg'$  peach-colored warfarin tablets by mouth, once-daily except on Wednesdays and Fridays,  take 1/2 tablet at [facility].  HOLD/OMIT warfarin as instructed by Urology for five (5) days prior to planned procedure. Stop aspirin for ten (10) days before procedure. RESUME these medications based upon instructions provided by your Urologic Surgeon/Provider after your  surgery is completed.        INR today: Therapeutic  PLAN Weekly dose was increased by 9% to 30 mg per week until five (5) days prior to planned procedure on 29-AUG-22.   Patient Instructions  Patient instructed to take medications as defined in the Anti-coagulation Track section of this encounter.  Patient instructed to take today's dose.  Patient instructed to take  one (1) tablet of your '5mg'$  peach-colored warfarin tablets by mouth, once-daily except on Wednesdays and Fridays,  take 1/2 tablet at [facility].  HOLD/OMIT warfarin as instructed by Urology for five (5) days prior to planned procedure. Stop aspirin for ten (10) days before procedure. RESUME these medications based upon instructions provided by your Urologic Surgeon/Provider after your surgery is completed.  Patient verbalized understanding of these instructions.   Patient advised to contact clinic or seek medical attention if signs/symptoms of bleeding or thromboembolism occur.  Patient verbalized understanding by repeating back information and was advised to contact me if further medication-related questions arise. Patient was also provided an information handout.  Follow-up Return in 4 weeks (on 03/08/2021) for Follow up INR.  [name], PharmD, CPP  15 minutes spent face-to-face with the patient during the encounter. 50% of time spent on education,  including signs/sx bleeding and clotting, as well as food and drug interactions with warfarin. 50% of time was spent on fingerprick POC INR sample collection,processing, results determination, and documentation in [email or web address].[email or web address]/.

## 2021-02-10 NOTE — Progress Notes (Signed)
INTERNAL MEDICINE TEACHING ATTENDING ADDENDUM - Chrystal Zeimet M.D  Duration- indefinite, Indication- PE, INR- therapeutic. Agree with pharmacy recommendations as outlined in their note.     

## 2021-02-11 NOTE — Patient Instructions (Addendum)
DUE TO COVID-19 ONLY ONE VISITOR IS ALLOWED TO COME WITH YOU AND STAY IN THE WAITING ROOM ONLY DURING PRE OP AND PROCEDURE.   **NO VISITORS ARE ALLOWED IN THE SHORT STAY AREA OR RECOVERY ROOM!!**       Your procedure is scheduled on: 02/22/21   Report to [facility] Main  Entrance    Report to admitting at 9:30 AM   Call this number if you have problems the morning of surgery [phone]   Do not eat food :After Midnight.   May have liquids until 8:45 AM  day of surgery  CLEAR LIQUID DIET  Foods Allowed                                                                     Foods Excluded  Water, Black Coffee and tea, regular and decaf               liquids that you cannot  Plain Jell-O in any flavor  (No red)                                    see through such as: Fruit ices (not with fruit pulp)                                            milk, soups, orange juice              Iced Popsicles (No red)                                              All solid food                                   Apple juices Sports drinks like Gatorade (No red) Lightly seasoned clear broth or consume(fat free) Sugar, honey syrup    Oral Hygiene is also important to reduce your risk of infection.                                    Remember - BRUSH YOUR TEETH THE MORNING OF SURGERY WITH YOUR REGULAR TOOTHPASTE   Take these medicines the morning of surgery with A SIP OF WATER: Atorvastatin, Toviaz, Mirabegron, Propranolol, Sertraline, Tamsulosin.              You may not have any metal on your body including jewelry, and body piercing             Do not wear lotions, powders, cologne, or deodorant              Men may shave face and neck.   Do not bring valuables to the hospital. [place]  FOR VALUABLES.   Contacts, dentures or bridgework may not be worn into surgery.    Patients discharged the day of surgery will not be allowed to drive home.  Special  Instructions: Bring a copy of your healthcare power of attorney and living will documents         the day of surgery if you haven't scanned them in before.   Please read over the following fact sheets you were given: IF YOU HAVE QUESTIONS ABOUT YOUR PRE OP INSTRUCTIONS PLEASE CALL [place] - Preparing for Surgery Before surgery, you can play an important role.  Because skin is not sterile, your skin needs to be as free of germs as possible.  You can reduce the number of germs on your skin by washing with CHG (chlorahexidine gluconate) soap before surgery.  CHG is an antiseptic cleaner which kills germs and bonds with the skin to continue killing germs even after washing. Please DO NOT use if you have an allergy to CHG or antibacterial soaps.  If your skin becomes reddened/irritated stop using the CHG and inform your nurse when you arrive at Short Stay. Do not shave (including legs and underarms) for at least 48 hours prior to the first CHG shower.  You may shave your face/neck.  Please follow these instructions carefully:  1.  Shower with CHG Soap the night before surgery and the  morning of surgery.  2.  If you choose to wash your hair, wash your hair first as usual with your normal  shampoo.  3.  After you shampoo, rinse your hair and body thoroughly to remove the shampoo.                             4.  Use CHG as you would any other liquid soap.  You can apply chg directly to the skin and wash.  Gently with a scrungie or clean washcloth.  5.  Apply the CHG Soap to your body ONLY FROM THE NECK DOWN.   Do   not use on face/ open                           Wound or open sores. Avoid contact with eyes, ears mouth and   genitals (private parts).                       Wash face,  Genitals (private parts) with your normal soap.             6.  Wash thoroughly, paying special attention to the area where your    surgery  will be performed.  7.  Thoroughly rinse your body with  warm water from the neck down.  8.  DO NOT shower/wash with your normal soap after using and rinsing off the CHG Soap.                9.  Pat yourself dry with a clean towel.            10.  Wear clean pajamas.            11.  Place clean sheets on your bed the night of your first shower and do not  sleep with pets. Day of Surgery : Do not apply any lotions/deodorants the morning of surgery.  Please wear clean clothes to the hospital/surgery center.  FAILURE  TO FOLLOW THESE INSTRUCTIONS MAY RESULT IN THE CANCELLATION OF YOUR SURGERY  PATIENT SIGNATURE_________________________________  NURSE SIGNATURE__________________________________  ________________________________________________________________________

## 2021-02-11 NOTE — Progress Notes (Addendum)
COVID swab appointment: N/a  COVID Vaccine Completed: Yes x3 Date COVID Vaccine completed: 04/25/20 Has received booster: COVID vaccine manufacturer: Pfizer       Date of COVID positive in last 90 days: No  PCP - Joni Reining, DO Cardiologist - N/a  Chest x-ray - N/a EKG - N/a Stress Test - long time ago, pt reports before stroke ECHO - N/a Cardiac Cath - N/a Pacemaker/ICD device last checked: N/a Spinal Cord Stimulator: N/a  Sleep Study - N/a CPAP -   Fasting Blood Sugar - N/a Checks Blood Sugar _____ times a day  Blood Thinner Instructions: Warfarin, hold 5 days prior Aspirin Instructions: ASA 81, stop 10 days prior, last dose 8/18 Last Dose:   Activity level: Can go up a flight of stairs and perform activities of daily living without stopping and without symptoms of chest pain or shortness of breath. Patient has left sided weakness from previous stroke. He is able to get up and down stairs he just has to take his time and be careful. He does use a leg brace and a cane.       Anesthesia review: stroke with left sided hemiplegia (2006), aortic atherosclerosis, PE  Patient denies shortness of breath, fever, cough and chest pain at PAT appointment   Patient verbalized understanding of instructions that were given to them at the PAT appointment. Patient was also instructed that they will need to review over the PAT instructions again at home before surgery.

## 2021-02-12 ENCOUNTER — Encounter (HOSPITAL_COMMUNITY)
Admission: RE | Admit: 2021-02-12 | Discharge: 2021-02-12 | Disposition: A | Discharge status: Home / Self Care | Payer: Medicare HMO | Source: Ambulatory Visit | Attending: Urology | Admitting: Urology

## 2021-02-12 ENCOUNTER — Other Ambulatory Visit: Payer: Self-pay

## 2021-02-12 ENCOUNTER — Encounter (HOSPITAL_COMMUNITY): Payer: Self-pay

## 2021-02-12 DIAGNOSIS — Z01812 Encounter for preprocedural laboratory examination: Secondary | ICD-10-CM | POA: Diagnosis not present

## 2021-02-12 HISTORY — DX: Malignant (primary) neoplasm, unspecified: C80.1

## 2021-02-12 LAB — CBC
HCT: 44.5 % (ref 39.0–52.0)
Hemoglobin: 13.7 g/dL (ref 13.0–17.0)
MCH: 26.1 pg (ref 26.0–34.0)
MCHC: 30.8 g/dL (ref 30.0–36.0)
MCV: 84.9 fL (ref 80.0–100.0)
Platelets: 169 10*3/uL (ref 150–400)
RBC: 5.24 MIL/uL (ref 4.22–5.81)
RDW: 14.5 % (ref 11.5–15.5)
WBC: 5.4 10*3/uL (ref 4.0–10.5)
nRBC: 0 % (ref 0.0–0.2)

## 2021-02-12 LAB — BASIC METABOLIC PANEL
Anion gap: 6 (ref 5–15)
BUN: 15 mg/dL (ref 8–23)
CO2: 23 mmol/L (ref 22–32)
Calcium: 9.3 mg/dL (ref 8.9–10.3)
Chloride: 110 mmol/L (ref 98–111)
Creatinine, Ser: 0.96 mg/dL (ref 0.61–1.24)
GFR, Estimated: >60 mL/min (ref >60)
Glucose, Bld: 115 mg/dL — ABNORMAL HIGH (ref 70–99)
Potassium: 3.7 mmol/L (ref 3.5–5.1)
Sodium: 139 mmol/L (ref 135–145)

## 2021-02-16 NOTE — Progress Notes (Addendum)
Anesthesia Chart Review   Case: [id number] Date/Time: 02/22/21 1130   Procedure: CYSTOSCOPY WITH RETROGRADE PYELOGRAM/ URETEROSCOPY/ POSSIBLE BIOPSY OF LESION, POSSIBLE RIGHT STENT PLACEMENT (Right) - ONLY NEEDS 30 MIN   Anesthesia type: General   Pre-op diagnosis: GROSS HEMATURIA, RIGHT URETERAL LESION   Location: [facility] / [facility]   Surgeons: Janith Lima, MD       DISCUSSION:65 y.o. former smoker with h/o COPD, PE, Stroke 2006 with left sided hemiplegia, gross hematuria, right ureteral lesion scheduled for above procedure 02/22/2021 with Dr. Rexene Alberts.   Pt on Warfarin, advised to hold 5 days prior to procedure per pharmacy note 02/08/2021.   Last PCP visit 12/03/2020.  VS: BP (!) 129/92   Pulse 64   Temp 36.6 C (Oral)   Resp 14   Ht 6' (1.829 m)   Wt 110.5 kg Comment: weighed self Monday  SpO2 99%   BMI 33.02 kg/m   PROVIDERS: [name], DO is PCP    LABS: Labs reviewed: Acceptable for surgery. (all labs ordered are listed, but only abnormal results are displayed)  Labs Reviewed  BASIC METABOLIC PANEL - Abnormal; Notable for the following components:      Result Value   Glucose, Bld 115 (*)    All other components within normal limits  CBC     IMAGES:   EKG:   CV: Echo 09/28/2004 SUMMARY   -  Overall left ventricular systolic function was normal. Left         ventricular ejection fraction was estimated , range being 55         % to 65 %.. Although no diagnostic left ventricular regional         wall motion abnormality was identified, this possibility         cannot be completely excluded on the basis of this study.         Left ventricular wall thickness was at the upper limits of         normal. There was mild asymmetric septal hypertrophy.   -  AO PHT 649 msec. Aortic valve thickness was mildly increased.         There was trivial aortic valvular regurgitation.   -  No likely, discrete, intracardiac source of emboli was apparent.          The study would have limited sensitivity for such a source.   IMPRESSIONS   -  No likely, discrete, intracardiac source of emboli was apparent.         The study would have limited sensitivity for such a source.  Past Medical History:  Diagnosis Date   Aortic atherosclerosis ([place]) 10/23/2017   Asymptomatic, seen on CT scan   Bilateral cataracts 05/18/2016   Not visually significant   Cancer ([place])    maxillary   Cerebrovascular accident (CVA) ([place]) 03/27/2020   Chronic constipation 06/12/2013   Chronic low back pain 06/12/2013   L4-5 facet arthropathy    Chronic pain of both shoulders 12/07/2013   A/C joint osteoarthritis    Cluster headaches    Resolved in 2006   COPD (chronic obstructive pulmonary disease) ([place])    Dry eye syndrome, bilateral [id number]   Embolic stroke involving right middle cerebral artery ([place]) 08/08/2004   Occured after traveling from [place] to [place] where he was hospitalized for 1 month with no further work-up or therapy.  Returned to [place] unable to walk and was admitted to [facility] immediately after landing.  Presumed embolic per neuro but TEE, bubble study, and hypercoag W/U negative. On indefinite coumadin per patient's informed preference.  Residual effects : Left spastic hemiplegia. Tx with phenol tibi   Generalized anxiety disorder 08/03/2018   History of kidney stones    Hyperplastic colon polyp 07/27/2012   Excised endoscopically 07/27/2012   Hypertensive retinopathy of both eyes 05/18/2016   Internal and external hemorrhoids without complication [id number]   Seen on colonoscopy 04/03/2007.   Left nephrolithiasis 08/31/2015   With mild left hydronephrosis.  Scheduled to undergo shockwave lithotripsy.   Obesity (BMI 30.0-34.9) 12/07/2013   Osteoarthritis of both knees 01/26/2012   Positive PPD 12/07/2013   Pulmonary embolism ([place]) 09/30/2004   Diagnosed in April 2006 after returning from [place].  Likely provoked as it occurred after a 12 hour plane flight  within 2 months of R MCA CVA with left hemiparesis. Patient has made an informed decision to remain on lifelong warfarin.    Tubular adenoma 12/19/2017   Endoscopically excised 04/03/2007.   Urge incontinence of urine 08/31/2015   Possibly secondary to prior CVA.  Treated with Myrbetriq.    Past Surgical History:  Procedure Laterality Date   EXCISION NASAL MASS Left 10/06/2017   Procedure: EXCISION LEFT NASAL MASS;  Surgeon: [name], MD;  Location: [facility];  Service: ENT;  Laterality: Left;  Removal of intvering papilloma frozen section   FOOT SURGERY Bilateral    Bunion   I & D EXTREMITY Left 11/28/2001   Left thumb thenar space abscess.   LITHOTRIPSY  2017   MAXILLECTOMY Left 11/22/2017   [name] 11/22/2017   MAXILLECTOMY Left 11/22/2017   Procedure: MAXILLECTOMY;  Surgeon: [name], MD;  Location: [facility];  Service: ENT;  Laterality: Left;   NASAL SINUS SURGERY Left 10/06/2017   Procedure: ENDOSCOPIC SINUS SURGERY;  Surgeon: [name], MD;  Location: [facility];  Service: ENT;  Laterality: Left;  Left side Endoscopic Macillary and Ethmoid Sinus   SKIN SPLIT GRAFT Left 11/22/2017   Procedure: SKIN GRAFT SPLIT THICKNESS;  Surgeon: [name], MD;  Location: [facility];  Service: ENT;  Laterality: Left;    MEDICATIONS:  aspirin EC 81 MG tablet   atorvastatin (LIPITOR) 10 MG tablet   fesoterodine (TOVIAZ) 8 MG TB24 tablet   mirabegron ER (MYRBETRIQ) 50 MG TB24 tablet   Multiple Vitamin (MULTIVITAMIN) tablet   propranolol ER (INDERAL LA) 60 MG 24 hr capsule   sertraline (ZOLOFT) 50 MG tablet   tamsulosin (FLOMAX) 0.4 MG CAPS capsule   warfarin (COUMADIN) 5 MG tablet   zolpidem (AMBIEN) 5 MG tablet   No current facility-administered medications for this encounter.     Konrad Felix Ward, PA-C [facility] [phone]

## 2021-02-22 ENCOUNTER — Encounter (HOSPITAL_COMMUNITY)
Admission: RE | Disposition: A | Discharge status: Home / Self Care | Payer: Self-pay | Source: Ambulatory Visit | Attending: Urology

## 2021-02-22 ENCOUNTER — Ambulatory Visit (HOSPITAL_COMMUNITY)
Admission: RE | Admit: 2021-02-22 | Discharge: 2021-02-22 | Disposition: A | Discharge status: Home / Self Care | Payer: Medicare HMO | Source: Ambulatory Visit | Attending: Urology | Admitting: Urology

## 2021-02-22 ENCOUNTER — Ambulatory Visit (HOSPITAL_COMMUNITY): Payer: Medicare HMO | Admitting: Physician Assistant

## 2021-02-22 ENCOUNTER — Ambulatory Visit (HOSPITAL_COMMUNITY): Payer: Medicare HMO | Admitting: Certified Registered Nurse Anesthetist

## 2021-02-22 ENCOUNTER — Ambulatory Visit (HOSPITAL_COMMUNITY): Payer: Medicare HMO

## 2021-02-22 ENCOUNTER — Other Ambulatory Visit: Payer: Self-pay

## 2021-02-22 ENCOUNTER — Encounter (HOSPITAL_COMMUNITY): Payer: Self-pay | Admitting: Urology

## 2021-02-22 DIAGNOSIS — E785 Hyperlipidemia, unspecified: Secondary | ICD-10-CM | POA: Diagnosis not present

## 2021-02-22 DIAGNOSIS — N138 Other obstructive and reflux uropathy: Secondary | ICD-10-CM | POA: Insufficient documentation

## 2021-02-22 DIAGNOSIS — Z87891 Personal history of nicotine dependence: Secondary | ICD-10-CM | POA: Insufficient documentation

## 2021-02-22 DIAGNOSIS — Z86711 Personal history of pulmonary embolism: Secondary | ICD-10-CM | POA: Diagnosis not present

## 2021-02-22 DIAGNOSIS — N3941 Urge incontinence: Secondary | ICD-10-CM | POA: Insufficient documentation

## 2021-02-22 DIAGNOSIS — R3912 Poor urinary stream: Secondary | ICD-10-CM | POA: Insufficient documentation

## 2021-02-22 DIAGNOSIS — N401 Enlarged prostate with lower urinary tract symptoms: Secondary | ICD-10-CM | POA: Diagnosis not present

## 2021-02-22 DIAGNOSIS — R350 Frequency of micturition: Secondary | ICD-10-CM | POA: Diagnosis not present

## 2021-02-22 DIAGNOSIS — N289 Disorder of kidney and ureter, unspecified: Secondary | ICD-10-CM

## 2021-02-22 DIAGNOSIS — R9341 Abnormal radiologic findings on diagnostic imaging of renal pelvis, ureter, or bladder: Secondary | ICD-10-CM | POA: Insufficient documentation

## 2021-02-22 DIAGNOSIS — R3914 Feeling of incomplete bladder emptying: Secondary | ICD-10-CM | POA: Diagnosis not present

## 2021-02-22 DIAGNOSIS — Z7901 Long term (current) use of anticoagulants: Secondary | ICD-10-CM | POA: Insufficient documentation

## 2021-02-22 DIAGNOSIS — Z79899 Other long term (current) drug therapy: Secondary | ICD-10-CM | POA: Diagnosis not present

## 2021-02-22 DIAGNOSIS — Z7982 Long term (current) use of aspirin: Secondary | ICD-10-CM | POA: Insufficient documentation

## 2021-02-22 DIAGNOSIS — Z86718 Personal history of other venous thrombosis and embolism: Secondary | ICD-10-CM | POA: Insufficient documentation

## 2021-02-22 DIAGNOSIS — R310 Gross hematuria: Secondary | ICD-10-CM | POA: Insufficient documentation

## 2021-02-22 DIAGNOSIS — N5201 Erectile dysfunction due to arterial insufficiency: Secondary | ICD-10-CM | POA: Diagnosis not present

## 2021-02-22 DIAGNOSIS — R351 Nocturia: Secondary | ICD-10-CM | POA: Diagnosis not present

## 2021-02-22 DIAGNOSIS — Z87442 Personal history of urinary calculi: Secondary | ICD-10-CM | POA: Insufficient documentation

## 2021-02-22 DIAGNOSIS — N2889 Other specified disorders of kidney and ureter: Secondary | ICD-10-CM | POA: Diagnosis not present

## 2021-02-22 DIAGNOSIS — I69354 Hemiplegia and hemiparesis following cerebral infarction affecting left non-dominant side: Secondary | ICD-10-CM | POA: Diagnosis not present

## 2021-02-22 DIAGNOSIS — F411 Generalized anxiety disorder: Secondary | ICD-10-CM | POA: Diagnosis not present

## 2021-02-22 HISTORY — PX: CYSTOSCOPY W/ RETROGRADES: SHX1426

## 2021-02-22 LAB — APTT: aPTT: 30 seconds (ref 24–36)

## 2021-02-22 LAB — PROTIME-INR
INR: 1.2 (ref 0.8–1.2)
Prothrombin Time: 15.4 seconds — ABNORMAL HIGH (ref 11.4–15.2)

## 2021-02-22 SURGERY — CYSTOSCOPY, WITH RETROGRADE PYELOGRAM
Anesthesia: General | Site: Bladder | Laterality: Right

## 2021-02-22 MED ORDER — ONDANSETRON HCL 4 MG/2ML IJ SOLN
INTRAMUSCULAR | Status: DC | PRN
  Administered 2021-02-22: 4 mg via INTRAVENOUS

## 2021-02-22 MED ORDER — CHLORHEXIDINE GLUCONATE 0.12 % MT SOLN
15.0000 mL | Freq: Once | OROMUCOSAL | Status: AC
  Administered 2021-02-22: 15 mL via OROMUCOSAL

## 2021-02-22 MED ORDER — ONDANSETRON HCL 4 MG/2ML IJ SOLN: 4.0000 mg | Freq: Once | INTRAMUSCULAR | Status: DC | PRN

## 2021-02-22 MED ORDER — ORAL CARE MOUTH RINSE: 15.0000 mL | Freq: Once | OROMUCOSAL | Status: AC

## 2021-02-22 MED ORDER — FENTANYL CITRATE (PF) 100 MCG/2ML IJ SOLN
INTRAMUSCULAR | Status: DC | PRN
  Administered 2021-02-22 (×2): 25 ug via INTRAVENOUS
  Administered 2021-02-22: 50 ug via INTRAVENOUS

## 2021-02-22 MED ORDER — LACTATED RINGERS IV SOLN
INTRAVENOUS | Status: DC
Start: 2021-02-22 — End: 2021-02-22

## 2021-02-22 MED ORDER — ONDANSETRON HCL 4 MG/2ML IJ SOLN
INTRAMUSCULAR | Status: AC
  Filled 2021-02-22: qty 2

## 2021-02-22 MED ORDER — LIDOCAINE 2% (20 MG/ML) 5 ML SYRINGE
INTRAMUSCULAR | Status: DC | PRN
  Administered 2021-02-22: 80 mg via INTRAVENOUS

## 2021-02-22 MED ORDER — OXYCODONE HCL 5 MG PO TABS: 5.0000 mg | ORAL_TABLET | Freq: Once | ORAL | Status: DC | PRN

## 2021-02-22 MED ORDER — DEXAMETHASONE SODIUM PHOSPHATE 10 MG/ML IJ SOLN
INTRAMUSCULAR | Status: AC
  Filled 2021-02-22: qty 1

## 2021-02-22 MED ORDER — FENTANYL CITRATE PF 50 MCG/ML IJ SOSY: 25.0000 ug | PREFILLED_SYRINGE | INTRAMUSCULAR | Status: DC | PRN

## 2021-02-22 MED ORDER — IOPAMIDOL (ISOVUE-300) INJECTION 61%
INTRAVENOUS | Status: DC | PRN
  Administered 2021-02-22: 7 mL

## 2021-02-22 MED ORDER — SODIUM CHLORIDE 0.9 % IR SOLN
Status: DC | PRN
  Administered 2021-02-22: 3000 mL via INTRAVESICAL

## 2021-02-22 MED ORDER — FENTANYL CITRATE (PF) 100 MCG/2ML IJ SOLN
INTRAMUSCULAR | Status: AC
  Filled 2021-02-22: qty 2

## 2021-02-22 MED ORDER — DEXAMETHASONE SODIUM PHOSPHATE 4 MG/ML IJ SOLN
INTRAMUSCULAR | Status: DC | PRN
  Administered 2021-02-22: 5 mg via INTRAVENOUS

## 2021-02-22 MED ORDER — OXYCODONE HCL 5 MG/5ML PO SOLN
5.0000 mg | Freq: Once | ORAL | Status: DC | PRN
Start: 2021-02-22 — End: 2021-02-22

## 2021-02-22 MED ORDER — PROPOFOL 10 MG/ML IV BOLUS
INTRAVENOUS | Status: DC | PRN
  Administered 2021-02-22: 140 mg via INTRAVENOUS

## 2021-02-22 MED ORDER — LIDOCAINE 2% (20 MG/ML) 5 ML SYRINGE
INTRAMUSCULAR | Status: AC
  Filled 2021-02-22: qty 5

## 2021-02-22 MED ORDER — CEFAZOLIN SODIUM-DEXTROSE 2-4 GM/100ML-% IV SOLN
2.0000 g | Freq: Once | INTRAVENOUS | Status: AC
  Administered 2021-02-22: 2 g via INTRAVENOUS
  Filled 2021-02-22: qty 100

## 2021-02-22 SURGICAL SUPPLY — 11 items
BAG URO CATCHER STRL LF (MISCELLANEOUS) ×2 IMPLANT
CATH INTERMIT  6FR 70CM (CATHETERS) ×1 IMPLANT
CLOTH BEACON ORANGE TIMEOUT ST (SAFETY) ×2 IMPLANT
GLOVE SURG ENC MOIS LTX SZ7 (GLOVE) ×2 IMPLANT
GOWN STRL REUS W/TWL LRG LVL3 (GOWN DISPOSABLE) ×2 IMPLANT
GUIDEWIRE STR DUAL SENSOR (WIRE) ×2 IMPLANT
KIT TURNOVER KIT A (KITS) ×2 IMPLANT
MANIFOLD NEPTUNE II (INSTRUMENTS) ×2 IMPLANT
NS IRRIG 1000ML POUR BTL (IV SOLUTION) IMPLANT
PACK CYSTO (CUSTOM PROCEDURE TRAY) ×2 IMPLANT
TUBING CONNECTING 10 (TUBING) ×2 IMPLANT

## 2021-02-22 NOTE — Transfer of Care (Signed)
Immediate Anesthesia Transfer of Care Note  Patient: Alexander English  Procedure(s) Performed: CYSTOSCOPY WITH RETROGRADE PYELOGRAM/ URETEROSCOPY/ POSSIBLE BIOPSY OF LESION, POSSIBLE RIGHT STENT PLACEMENT (Right: Bladder)  Patient Location: PACU  Anesthesia Type:General  Level of Consciousness: awake and patient cooperative  Airway & Oxygen Therapy: Patient Spontanous Breathing and Patient connected to face mask  Post-op Assessment: Report given to RN and Post -op Vital signs reviewed and stable  Post vital signs: Reviewed and stable  Last Vitals:  Vitals Value Taken Time  BP 162/85 02/22/21 1303  Temp    Pulse 64 02/22/21 1304  Resp    SpO2 100 % 02/22/21 1304  Vitals shown include unvalidated device data.  Last Pain:  Vitals:   02/22/21 1041  TempSrc: Oral  PainSc:       Patients Stated Pain Goal: 5 ([id number])  Complications: No notable events documented.

## 2021-02-22 NOTE — H&P (Signed)
CC/HPI: Alexander English is a 65 year old male seen in follow-up with BPH with lower urinary tract symptoms, prostate cancer screening, history of gross hematuria, history of urolithiasis.    1. Urinary urgency:  Of note, he had a CVA many years ago and has what appears to be residual left-sided weakness. He is mobile with a cane and has an external brace on his left lower extremity. He does complain of urinary urgency with some urinary urge incontinence. For this, he takes Toviaz 8 mg daily and Myrbetriq 50 mg daily. He states these medications are effective and helping his urinary urgency however it is still persistent.   2. BPH with LUTS:  He also complains of a sensation incomplete bladder emptying, urinary frequency, intermittency, weak flow of stream, 3 time nocturia. He also takes tamsulosin 0.4 mg daily. His IPSS today is 14, quality-of-life 5. His most bothersome complaint is urinary urgency and frequency. His PVR today is 21 mL. Cystoscopy 12/31/2020 with mildly obstructing moderate hyperplasia of the prostate.   3. Gross hematuria:  He has noted some gross hematuria over the past couple of months. He did have a negative gross hematuria evaluation and 05/2015. He is a smoking history and smoked 1 pack/day for 15 years and quit in 2010. He denies a family history of urologic malignancy.  -CT hematuria protocol 12/29/2020 with nonobstructive wall thickening in the proximal right ureter with adjacent stranding which is recommended to pursue ureteroscopy.  -Cystoscopy 12/31/2020 unremarkable   4. Erectile dysfunction:  He has complains of erectile dysfunction with difficulty maintaining erection satisfactory for intercourse. He is able to obtain erection that is mostly rigid. He denies taking nitroglycerin. He has not tried PDE 5 inhibitors. Sildenafil was prescribed however this was cost prohibitive.   5. History of urolithiasis:  He does have a history of urolithiasis and underwent ESWL in  08/2015. He also passed a ureteral stone and 05/2019. He denies passing a stone since then. He denies abdominal pain or flank pain. CT hematuria protocol on 12/29/2020 with no calculi visualized.   6. Prostate lesion: CT hematuria protocol 12/29/2020 with enlarged prostate with dominant focus of enhancement in the left aspect of the gland which is nonspecific. PSA on 05/28/2020 was 0.37. He denies family history of prostate cancer.     ALLERGIES: No Allergies    MEDICATIONS: Myrbetriq 50 mg tablet, extended release 24 hr 1 tablet PO Daily  Tamsulosin Hcl 0.4 mg capsule 1 capsule PO Q PM  Toviaz 8 mg tablet, extended release 24 hr 1 tablet PO Daily  Aspirin Ec 81 mg tablet, delayed release Oral  Lipitor 10 mg tablet Oral  Multivitamins tablet Oral  Sildenafil Citrate 100 mg tablet 1 tablet PO Daily  Warfarin Sodium 5 mg tablet Oral     GU PSH: ESWL - 2017 Locm 300-'399Mg'$ /Ml Iodine,1Ml - 12/29/2020, 04/24/2019       PSH Notes: Renal Lithotripsy, Foot Surgery   NON-GU PSH: None   GU PMH: Gross hematuria - 12/29/2020, - 12/01/2020 (Stable), It appears his gross hematuria is secondary to a right ureteral stone., - 04/30/2019 BPH w/LUTS - 12/01/2020, - 05/28/2020, Benign prostatic hyperplasia (BPH) with urinary urgency, - 2017 ED due to arterial insufficiency - 12/01/2020, He does have erectile dysfunction and wanted to consider starting sildenafil. He did not want a prescription today., - 06/04/2019 Elevated PSA - 12/01/2020, - 05/28/2020 (Stable), He has a past history of an elevated PSA but his PSA has remained normal since that time and is currently  0.37 with no abnormality on DRE. This can be checked again in 1 year period, - 03/19/2019 (Improving), He had a transient elevation of his PSA for unknown reasons but since that time his PSA has been monitored and it has remained completely normal. It is currently 1.52 with a normal DRE. I have recommended yearly DRE and PSA., - 2019, He has had a significant  elevation of his PSA. There is no sign of infection. I am going to repeat the PSA to confirm that it is elevated and if so I have discussed proceeding with a prostate biopsy. He will need to be off his Coumadin for that., - 2019 Urinary Urgency - 12/01/2020, (Stable), - 05/28/2020 (Stable), He has noted improvement of his urgency and urge incontinence on a combination of Myrbetriq and Toviaz. He will remain on these and I will see him back in 1 year for reassessment., - 2019 Urinary Frequency - 05/28/2020 Weak Urinary Stream - 05/28/2020 Ureteral calculus (Acute), Right, He has a right ureteral calculus. At 4 mm wide and has a high probability of spontaneous passage. Currently located at the lower border of the L3 vertebral body on the right-hand side. We discussed medical expulsive therapy. I am going to place him on tamsulosin especially because he is not having any pain whatsoever and then have him return in 3 weeks for a follow-up KUB. - 04/30/2019 Ureteral obstruction secondary to calculous (Acute), Right, He had hydronephrosis on his CT scan but it was mild and caused by the presence of his proximal right ureteral stone. - 04/30/2019 History of urolithiasis (Stable), He has no evidence of recurrent calculus disease. - 2017 Urge incontinence, Urge incontinence of urine - 2017      PMH Notes: Gross hematuria: On 06/24/15 he reported a seven-day history of gross hematuria. was associated with urgency but he indicated that had been present for one year. He has been a smoker in the past. Lumbar spine films in 2/15 included the kidneys and did not reveal any definite renal calculi. Creatinine 12/16 was 0.9.  CT scan 2/17 - Left ureteral calculus but no renal calculi noted.  Treatment: ESWL 09/09/15   LUTS: He reported that he has urgency with some urge incontinence. He's had a prior CVA 11 years ago but indicates he's had urgency with urge incontinence for only about the past year. He has nocturia 4-5 times  but states he increased his fluid intake after he was found to have hypernatremia.     NON-GU PMH: Encounter for general adult medical examination without abnormal findings, Encounter for preventive health examination - 2017 DVT, History, History of blood clots - 2017 Hypercholesterolemia, High cholesterol - 2017 Personal history of other diseases of the musculoskeletal system and connective tissue, History of arthritis - 2017 Personal history of transient ischemic attack (TIA), and cerebral infarction without residual deficits, History of stroke - 2017    FAMILY HISTORY: No pertinent family history - Runs In Family   SOCIAL HISTORY: Marital Status: Single Preferred Language: English; Ethnicity: Not Hispanic Or Latino; Race: Black or African American Current Smoking Status: Patient does not smoke anymore.  Does not use smokeless tobacco. Social Drinker.  Does not use drugs. Drinks 2 caffeinated drinks per day. Has not had a blood transfusion.     Notes: Alcohol use, Former smoker, Caffeine use, Divorced   REVIEW OF SYSTEMS:    GU Review Male:   Patient denies frequent urination, hard to postpone urination, burning/ pain with urination, get up at  night to urinate, leakage of urine, stream starts and stops, trouble starting your stream, have to strain to urinate , erection problems, and penile pain.  Gastrointestinal (Upper):   Patient denies nausea, vomiting, and indigestion/ heartburn.  Gastrointestinal (Lower):   Patient denies diarrhea and constipation.  Constitutional:   Patient denies fever, night sweats, weight loss, and fatigue.  Skin:   Patient denies skin rash/ lesion and itching.  Eyes:   Patient denies blurred vision and double vision.  Ears/ Nose/ Throat:   Patient denies sore throat and sinus problems.  Hematologic/Lymphatic:   Patient denies swollen glands and easy bruising.  Cardiovascular:   Patient denies leg swelling and chest pains.  Respiratory:   Patient denies  cough and shortness of breath.  Endocrine:   Patient denies excessive thirst.  Musculoskeletal:   Patient denies back pain and joint pain.  Neurological:   Patient denies headaches and dizziness.  Psychologic:   Patient denies depression and anxiety.   VITAL SIGNS: None   MULTI-SYSTEM PHYSICAL EXAMINATION:    Constitutional: Well-nourished. No physical deformities. Normally developed. Good grooming.  Respiratory: No labored breathing, no use of accessory muscles.   Cardiovascular: Normal temperature, normal extremity pulses, no swelling, no varicosities.  Gastrointestinal: No mass, no tenderness, no rigidity, non obese abdomen.     Complexity of Data:  Source Of History:  Patient, Medical Record Summary  Lab Test Review:   PSA  Records Review:   AUA Symptom Score, Previous Patient Records  Urine Test Review:   Urinalysis  Urodynamics Review:   Review Bladder Scan  X-Ray Review: C.T. Abdomen/Pelvis: Reviewed Films. Reviewed Report. Discussed With Patient.     05/28/20 03/12/19 02/23/18 09/21/17 09/08/17 08/11/15  PSA  Total PSA 0.37 ng/mL 0.37 ng/mL 1.52 ng/mL 0.80 ng/mL 4.8 ng/dl 1.47    Notes:                     CLINICAL DATA: Gross hematuria x2 days, history of renal stones   EXAM:  CT ABDOMEN AND PELVIS WITHOUT AND WITH CONTRAST   TECHNIQUE:  Multidetector CT imaging of the abdomen and pelvis was performed  following the standard protocol before and following the bolus  administration of intravenous contrast.   CONTRAST: 100 cc of Omnipaque 300   COMPARISON: CT April 24, 2019   FINDINGS:  Lower chest: Right middle lobe scarring. No acute abnormality.   Hepatobiliary: No suspicious hepatic lesion. Gallbladder is  unremarkable. No biliary ductal dilation.   Pancreas: Within normal limits.   Spleen: Within normal limits.   Adrenals/Urinary Tract: Bilateral adrenal glands are unremarkable.   No hydronephrosis. No renal, ureteral or bladder calculi visualized.   Tiny bilateral hypodense renal lesions which are technically too  small to accurately characterize but statistically likely represent  cysts. No solid enhancing renal masses. Symmetric enhancement and  excretion of contrast in the bilateral kidneys. Nonobstructive wall  thickening of the proximal right ureter with adjacent stranding,  seen on image 70-70 8/607.   Symmetric trabecular wall thickening of the urinary bladder.   Stomach/Bowel: Tiny hiatal hernia otherwise the stomach is  unremarkable. No pathologic dilation of small bowel. The appendix  and terminal ileum are grossly unremarkable. Small volume of formed  stool throughout the colon.   Vascular/Lymphatic: Aortic atherosclerosis without aneurysmal  dilation. No pathologically enlarged abdominal or pelvic lymph  nodes.   Reproductive: Enlarged prostate with a dominant focus of enhancement  in the left aspect of the gland on image 73/5.  Other: No abdominopelvic ascites.   Musculoskeletal: Multilevel degenerative changes spine. Degenerative  change of the hips. No acute osseous abnormality.   IMPRESSION:  1. Nonobstructive wall thickening of the proximal right ureter with  adjacent stranding, nonspecific recommend correlation with  urinalysis and possible ureteroscopy.  2. Enlarged prostate with a dominant focus of enhancement in the  left aspect of the gland, nonspecific further evaluation with PSA is  recommended.  3. No renal, ureteral or bladder calculi visualized. No  hydronephrosis.  4. No solid enhancing renal masses.  5. Choose 1    Electronically Signed  By: Dahlia Bailiff MD  On: 12/29/2020 13:35     PROCEDURES:         Flexible Cystoscopy - 52000  Risks, benefits, and some of the potential complications of the procedure were discussed at length with the patient including infection, bleeding, voiding discomfort, urinary retention, fever, chills, sepsis, and others. All questions were answered.  Informed consent was obtained. Antibiotic prophylaxis was given. Sterile technique and intraurethral analgesia were used.  Meatus:  Normal size. Normal location. Normal condition.  Urethra:  No strictures.  External Sphincter:  Normal.  Verumontanum:  Normal.  Prostate:  Borderline obstructing. Moderate hyperplasia.  Bladder Neck:  Non-obstructing.  Ureteral Orifices:  Normal location. Normal size. Normal shape. Effluxed clear urine.  Bladder:  No trabeculation. No tumors. Normal mucosa. No stones.      The lower urinary tract was carefully examined. The procedure was well-tolerated and without complications. Antibiotic instructions were given. Instructions were given to call the office immediately for bloody urine, difficulty urinating, urinary retention, painful or frequent urination, fever, chills, nausea, vomiting or other illness. The patient stated that he understood these instructions and would comply with them.         Urinalysis Dipstick Dipstick Cont'd  Color: Yellow Bilirubin: Neg mg/dL  Appearance: Clear Ketones: Neg mg/dL  Specific Gravity: 1.020 Blood: Neg ery/uL  pH: 6.5 Protein: Trace mg/dL  Glucose: Neg mg/dL Urobilinogen: 0.2 mg/dL    Nitrites: Neg    Leukocyte Esterase: Neg leu/uL    ASSESSMENT:      ICD-10 Details  1 GU:   Prostate Disorder, Unspec - N42.9   2   Gross hematuria - R31.0   3   BPH w/LUTS - N40.1   4   ED due to arterial insufficiency - N52.01   5   Urinary Frequency - R35.0   6   Urinary Urgency - R39.15   7   Weak Urinary Stream - R39.12    PLAN:           Orders Labs CULTURE, URINE  X-Rays: MRI Prostate GSORAD With and Without I.V. Contrast. No Oral Contrast          Schedule         Document Letter(s):  Created for Patient: Clinical Summary         Notes:    1. Urinary urgency: This is chronic and stable. He does have bothersome urgency however it is well controlled this time with Myrbetriq 50 mg and Toviaz 8 mg daily. We did  discuss that if his symptoms were to worsen, he could consider Botox injection the bladder. We discussed risk and benefits including risk of urinary retention. He would like to continue with pharmacologic therapy.   2. BPH with LUTS:  Continues on tamsulosin 0.4 mg daily. I briefly discussed surgical options with him today. He does have a history of a CVA. He would consider  surgical options in the future. He states his symptoms have improved recently/ He is also interested in potentially fathering a child as he has a young wife and is bothered by the possibility of retrograde ejaculation's with some of these procedures. Follow-up in 6 months to reevaluate.   3. Prostate cancer screening: DRE today approximately 50 g, no nodules. Last PSA on 05/28/2020 was normal at 0.37. CT hematuria protocol 12/29/2020 with enhancement in the left aspect the prostate. We discussed options including trending PSA, proceeding with standard biopsy or prostate MRI. He elects to proceed with prostate MRI. This has been ordered. I will plan to call him with results.   4. History of urolithiasis: CT hematuria protocol 12/29/2020 with no evidence of stones.   #5 Gross hematuria:  -CT hematuria protocol 12/29/2020 with nonobstructive wall thickening in the proximal right ureter with adjacent stranding which is recommended to pursue ureteroscopy.  -Cystoscopy 12/31/2020 unremarkable  -I recommend proceeding with cystoscopy, bilateral retrograde pyelograms, right diagnostic ureteroscopy with possible biopsy, possible right ureteral stent placement. Discussed risk and benefits. He elects to proceed. Preoperative urine culture sent today.   6. ED: The pathophysiology and physiology of erections were discussed. I then discussed with the patient from the standpoint of "potential targets" how pharmacologic therapy works and how combination therapy can be more efficacious than monotherapy. Additionally, the place for oral therapy as well as  urethral suppositories was also discussed with the patient.   The patient was told that the approach here is truly to begin with the less invasive therapy which would be oral therapy, moving on to intracorporal therapy if we could not get a successful result with oral therapy and if we could not get a success result with intracavernosal therapy then it might be appropriate to discuss prosthetic placement. The risks of intracorporal therapy were discussed including bleeding, bruising and the development of penile curvature. They understand that prostheses, while very good, are at this center considered to be the course of last resort. They are such in that they have a finite life span, generally in the time of about 10 years, and with revision, the complication rate is higher. The patient was also counseled with the option of continued watchful waiting.   Plan to start with trial of medication. We discussed the Nitrate contraindications and potential side effects of these medications such as headache, facial flushing, GI upset, nasal congestion and priapism.  Rx provided for sildenafil '100mg'$  he did not pick this up but this is cost prohibitive. We did discuss options including dispensing in our in-house pharmacy. He will consider and follow-up.   CC: [name], MD   Urology Preoperative H&P   Chief Complaint: Right proximal ureteral thickening  History of Present Illness: Alexander English is a 65 y.o. male with right proximal ureteral thickening here for cysto, R RPG, R URS, possible biopsy.    Past Medical History:  Diagnosis Date   Aortic atherosclerosis ([place]) 10/23/2017   Asymptomatic, seen on CT scan   Bilateral cataracts 05/18/2016   Not visually significant   Cancer ([place])    maxillary   Cerebrovascular accident (CVA) ([place]) 03/27/2020   Chronic constipation 06/12/2013   Chronic low back pain 06/12/2013   L4-5 facet arthropathy    Chronic pain of both shoulders 12/07/2013    A/C joint osteoarthritis    Cluster headaches    Resolved in 2006   COPD (chronic obstructive pulmonary disease) ([place])    Dry eye syndrome, bilateral 05/18/2016  Embolic stroke involving right middle cerebral artery ([place]) 08/08/2004   Occured after traveling from [place] to [place] where he was hospitalized for 1 month with no further work-up or therapy.  Returned to [place] unable to walk and was admitted to [facility] immediately after landing. Presumed embolic per neuro but TEE, bubble study, and hypercoag W/U negative. On indefinite coumadin per patient's informed preference.  Residual effects : Left spastic hemiplegia. Tx with phenol tibi   Generalized anxiety disorder 08/03/2018   History of kidney stones    Hyperplastic colon polyp 07/27/2012   Excised endoscopically 07/27/2012   Hypertensive retinopathy of both eyes 05/18/2016   Internal and external hemorrhoids without complication [id number]   Seen on colonoscopy 04/03/2007.   Left nephrolithiasis 08/31/2015   With mild left hydronephrosis.  Scheduled to undergo shockwave lithotripsy.   Obesity (BMI 30.0-34.9) 12/07/2013   Osteoarthritis of both knees 01/26/2012   Positive PPD 12/07/2013   Pulmonary embolism ([place]) 09/30/2004   Diagnosed in April 2006 after returning from [place].  Likely provoked as it occurred after a 12 hour plane flight within 2 months of R MCA CVA with left hemiparesis. Patient has made an informed decision to remain on lifelong warfarin.    Tubular adenoma 12/19/2017   Endoscopically excised 04/03/2007.   Urge incontinence of urine 08/31/2015   Possibly secondary to prior CVA.  Treated with Myrbetriq.    Past Surgical History:  Procedure Laterality Date   EXCISION NASAL MASS Left 10/06/2017   Procedure: EXCISION LEFT NASAL MASS;  Surgeon: [name], MD;  Location: [place];  Service: ENT;  Laterality: Left;  Removal of intvering papilloma frozen section   FOOT SURGERY Bilateral    Bunion   I & D EXTREMITY Left 11/28/2001    Left thumb thenar space abscess.   LITHOTRIPSY  2017   MAXILLECTOMY Left 11/22/2017   [name] 11/22/2017   MAXILLECTOMY Left 11/22/2017   Procedure: MAXILLECTOMY;  Surgeon: [name], MD;  Location: [place];  Service: ENT;  Laterality: Left;   NASAL SINUS SURGERY Left 10/06/2017   Procedure: ENDOSCOPIC SINUS SURGERY;  Surgeon: [name], MD;  Location: [place];  Service: ENT;  Laterality: Left;  Left side Endoscopic Macillary and Ethmoid Sinus   SKIN SPLIT GRAFT Left 11/22/2017   Procedure: SKIN GRAFT SPLIT THICKNESS;  Surgeon: [name], MD;  Location: [place];  Service: ENT;  Laterality: Left;    Allergies: No Known Allergies  Family History  Problem Relation Age of Onset   Stroke Maternal Grandmother    Unexplained death Mother    Depression Mother        After husband's death   Unexplained death Father    Osteoarthritis Father        Knees   Early death Sister    Seizures Sister    Early death Brother 59       Unknown cause   Healthy Daughter    Healthy Son    Stroke Maternal Aunt    Healthy Sister    Healthy Sister    Unexplained death Brother    Healthy Brother    Healthy Son     Social History:  reports that he quit smoking about 13 years ago. His smoking use included cigarettes. He has never used smokeless tobacco. He reports that he does not currently use alcohol. He reports that he does not use drugs.  ROS: A complete review of systems was performed.  All systems are negative except for pertinent findings as noted.  Physical Exam:  Vital signs in last 24 hours:   Constitutional:  Alert and oriented, No acute distress Cardiovascular: Regular rate and rhythm Respiratory: Normal respiratory effort, Lungs clear bilaterally GI: Abdomen is soft, nontender, nondistended, no abdominal masses GU: No CVA tenderness Lymphatic: No lymphadenopathy Neurologic: Grossly intact, no focal deficits Psychiatric: Normal mood and affect  Laboratory Data:  No results for  input(s): WBC, HGB, HCT, PLT in the last 72 hours.  No results for input(s): NA, K, CL, GLUCOSE, BUN, CALCIUM, CREATININE in the last 72 hours.  Invalid input(s): CO3   No results found for this or any previous visit (from the past 24 hour(s)). No results found for this or any previous visit (from the past 240 hour(s)).  Renal Function: No results for input(s): CREATININE in the last 168 hours. Estimated Creatinine Clearance: 97.2 mL/min (by C-G formula based on SCr of 0.96 mg/dL).  Radiologic Imaging: No results found.  I independently reviewed the above imaging studies.  Assessment and Plan Alexander English is a 65 y.o. male with right proximal ureteral thickening here for cysto, R RPG, R URS, possible biopsy.  -The risks, benefits and alternatives of cysto, R RPG, R URS, possible biopsy was discussed with the patient.  Risks include, but are not limited to: bleeding, urinary tract infection, ureteral injury, ureteral stricture disease, chronic pain, urinary symptoms, bladder injury, stent migration, the need for nephrostomy tube placement, MI, CVA, DVT, PE and the inherent risks with general anesthesia.  The patient voices understanding and wishes to proceed.     Matt R. Davidlee Jeanbaptiste MD 02/22/2021, 9:34 AM  [facility] Pager: [phone]

## 2021-02-22 NOTE — Op Note (Addendum)
Operative Note  Preoperative diagnosis:  1.  Right ureteral lesion  Postoperative diagnosis: 1.  No evidence of right ureteral lesion  Procedure(s): 1. Cystoscopy 2. Right diagnostic ureteroscopy 3. Bilateral retrograde pyelogram 4. Fluoroscopy with intraoperative interpretation  Surgeon: Rexene Alberts, MD  Assistants:  None  Anesthesia:  General  Complications:  None  EBL:  Minimal  Specimens: 1.  ID Type Source Tests Collected by Time Destination  A : right renal pelvis washings Urinary Renal Washing/Brushing CYTOLOGY - NON PAP Janith Lima, MD 02/22/2021 1248    Drains/Catheters: 1. None  Intraoperative findings:   Cystoscopy demonstrated no suspicious bladder lesions. Left retrograde pyelogram demonstrated no hydronephrosis, filling defects, or extravasation. Right retrograde pyelogram demonstrated no hydronephrosis, filling defects, or extravasation. Right ureteroscopy demonstrated no suspicious lesions in the right ureter, renal pelvis or calyces.   Indication:  Alexander English is a 65 y.o. male with CT A/P hematuria protocol 12/29/2020 with nonobstructive wall thickening in the proximal right ureter with adjacent stranding which is recommended to pursue ureteroscopy.   Description of procedure: After informed consent was obtained from the patient, the patient was identified and taken to the operating room and placed in the supine position.  General anesthesia was administered as well as perioperative IV antibiotics.  At the beginning of the case, a time-out was performed to properly identify the patient, the surgery to be performed, and the surgical site.  Sequential compression devices were applied to the lower extremities at the beginning of the case for DVT prophylaxis.  The patient was then placed in the dorsal lithotomy supine position, prepped and draped in sterile fashion.  We then passed the 21-French rigid cystoscope through the urethra and into the bladder  under vision without any difficulty, noting a normal urethra without strictures and a mildly obstructing prostate.  A systematic evaluation of the bladder revealed no evidence of any suspicious bladder lesions.  Ureteral orifices were in normal position.    Under cystoscopic and flouroscopic guidance, we cannulated the left ureteral orifice with a 5-French open-ended ureteral catheter and a gentle retrograde pyelogram was performed, revealing a normal caliber ureter without any filling defects. There was no hydronephrosis of the collecting system. There was no filling defect.  Under cystoscopic and flouroscopic guidance, we cannulated the right ureteral orifice with a 5-French open-ended ureteral catheter and a gentle retrograde pyelogram was performed, revealing a normal caliber ureter without any filling defects. There was no hydronephrosis of the collecting system. There was no filling defect. A sensor wire was passed and a digital flexible scope was placed into the kidney. A complete pyeloscopy demonstrated no evidence of lesions within the kidney, renal pelvis or calyces. Urine was aspirated and sent for cytology. I removed the ureteroscope and surveyed the ureter with evidence of wide caliber rings within the ureter, however no evidence of obstruction.  The patient tolerated the procedure well and there was no complication. Patient was awoken from anesthesia and taken to the recovery room in stable condition. I was present and scrubbed for the entirety of the case.  Plan:  Patient will be discharged home.  Follow up with me in office as scheduled.  [facility]  Pager: [phone]

## 2021-02-22 NOTE — Anesthesia Postprocedure Evaluation (Signed)
Anesthesia Post Note  Patient: Alexander English  Procedure(s) Performed: CYSTOSCOPY WITH RETROGRADE PYELOGRAM/ URETEROSCOPY/ POSSIBLE BIOPSY OF LESION, POSSIBLE RIGHT STENT PLACEMENT (Right: Bladder)     Patient location during evaluation: PACU Anesthesia Type: General Level of consciousness: awake and alert and oriented Pain management: pain level controlled Vital Signs Assessment: post-procedure vital signs reviewed and stable Respiratory status: spontaneous breathing, nonlabored ventilation and respiratory function stable Cardiovascular status: blood pressure returned to baseline and stable Postop Assessment: no apparent nausea or vomiting Anesthetic complications: no   No notable events documented.  Last Vitals:  Vitals:   02/22/21 1315 02/22/21 1330  BP: (!) 161/86   Pulse: 63 65  Resp: 16   Temp:    SpO2: 97% 98%    Last Pain:  Vitals:   02/22/21 1330  TempSrc:   PainSc: 0-No pain                 Alexander English A.

## 2021-02-22 NOTE — Anesthesia Procedure Notes (Signed)
Procedure Name: LMA Insertion Date/Time: 02/22/2021 12:22 PM Performed by: Claudia Desanctis, CRNA Pre-anesthesia Checklist: Emergency Drugs available, Patient identified, Suction available and Patient being monitored Patient Re-evaluated:Patient Re-evaluated prior to induction Oxygen Delivery Method: Circle system utilized Preoxygenation: Pre-oxygenation with 100% oxygen Induction Type: IV induction Ventilation: Mask ventilation without difficulty LMA: LMA inserted LMA Size: 5.0 Number of attempts: 1 Placement Confirmation: positive ETCO2 and breath sounds checked- equal and bilateral Tube secured with: Tape Dental Injury: Teeth and Oropharynx as per pre-operative assessment

## 2021-02-22 NOTE — Anesthesia Preprocedure Evaluation (Signed)
Anesthesia Evaluation  Patient identified by MRN, date of birth, ID band Patient awake    Reviewed: Allergy & Precautions, NPO status , Patient's Chart, lab work & pertinent test results  History of Anesthesia Complications Negative for: history of anesthetic complications  Airway Mallampati: IV  TM Distance: >3 FB Neck ROM: Full  Mouth opening: Limited Mouth Opening  Dental  (+) Teeth Intact, Dental Advisory Given   Pulmonary COPD, former smoker,    Pulmonary exam normal breath sounds clear to auscultation       Cardiovascular (-) hypertension(-) angina+ Peripheral Vascular Disease  (-) Past MI Normal cardiovascular exam Rhythm:Regular Rate:Normal     Neuro/Psych  Headaches, PSYCHIATRIC DISORDERS Anxiety Mild cognitive impairmentLeft spastic hemiplegia  Neuromuscular disease CVA, Residual Symptoms    GI/Hepatic Neg liver ROS,   Endo/Other  negative endocrine ROSHyperlipidemia Pre diabetes  Renal/GU Renal diseaseHx/o renal calculi Right ureteral lesion   Gross hematuria    Musculoskeletal  (+) Arthritis , Osteoarthritis,    Abdominal (+) + obese,   Peds  Hematology  (+) anemia , Coumadin therapy- last dose  INR 8/15 -2.1   Anesthesia Other Findings   Reproductive/Obstetrics                             Anesthesia Physical  Anesthesia Plan  ASA: 3  Anesthesia Plan: General   Post-op Pain Management:    Induction: Intravenous  PONV Risk Score and Plan: 2 and Ondansetron, Dexamethasone and Treatment may vary due to age or medical condition  Airway Management Planned: LMA  Additional Equipment: None  Intra-op Plan:   Post-operative Plan: Extubation in OR  Informed Consent: I have reviewed the patients History and Physical, chart, labs and discussed the procedure including the risks, benefits and alternatives for the proposed anesthesia with the patient or authorized  representative who has indicated his/her understanding and acceptance.     Dental advisory given  Plan Discussed with: CRNA and Anesthesiologist  Anesthesia Plan Comments:         Anesthesia Quick Evaluation

## 2021-02-23 ENCOUNTER — Encounter (HOSPITAL_COMMUNITY): Payer: Self-pay | Admitting: Urology

## 2021-02-23 LAB — CYTOLOGY - NON PAP

## 2021-03-02 DIAGNOSIS — N401 Enlarged prostate with lower urinary tract symptoms: Secondary | ICD-10-CM | POA: Diagnosis not present

## 2021-03-02 DIAGNOSIS — R350 Frequency of micturition: Secondary | ICD-10-CM | POA: Diagnosis not present

## 2021-03-02 DIAGNOSIS — N5201 Erectile dysfunction due to arterial insufficiency: Secondary | ICD-10-CM | POA: Diagnosis not present

## 2021-03-02 DIAGNOSIS — R310 Gross hematuria: Secondary | ICD-10-CM | POA: Diagnosis not present

## 2021-03-02 DIAGNOSIS — R972 Elevated prostate specific antigen [PSA]: Secondary | ICD-10-CM | POA: Diagnosis not present

## 2021-03-08 ENCOUNTER — Ambulatory Visit: Payer: Medicare HMO

## 2021-03-22 ENCOUNTER — Ambulatory Visit (INDEPENDENT_AMBULATORY_CARE_PROVIDER_SITE_OTHER): Payer: Medicare HMO | Admitting: Pharmacist

## 2021-03-22 DIAGNOSIS — Z23 Encounter for immunization: Secondary | ICD-10-CM | POA: Diagnosis not present

## 2021-03-22 DIAGNOSIS — Z86718 Personal history of other venous thrombosis and embolism: Secondary | ICD-10-CM

## 2021-03-22 DIAGNOSIS — Z7901 Long term (current) use of anticoagulants: Secondary | ICD-10-CM | POA: Diagnosis not present

## 2021-03-22 LAB — POCT INR: INR: 2.3 (ref 2.0–3.0)

## 2021-03-22 NOTE — Patient Instructions (Signed)
Patient instructed to take medications as defined in the Anti-coagulation Track section of this encounter.  Patient instructed to take today's dose.  Patient instructed to take one of your 5mg peach-colored warfarin tablets by mouth, once-daily--EXCEPT on Wednesdays and Fridays, take ONLY 1/2 tablet.  Patient verbalized understanding of these instructions.   

## 2021-03-22 NOTE — Progress Notes (Signed)
INTERNAL MEDICINE TEACHING ATTENDING ADDENDUM   I agree with pharmacy recommendations as outlined in their note.   Jacari Kirsten, MD  

## 2021-03-22 NOTE — Progress Notes (Signed)
Anticoagulation Management Alexander English is a 65 y.o. male who reports to the clinic for monitoring of warfarin treatment.    Indication: DVT, History of; Long term current use of warfarin oral anticoagulant to maintain INR 2.0 - 3.0.  Duration: indefinite Supervising physician:  Velna Ochs, MD  Anticoagulation Clinic Visit History: Patient does not know report signs/symptoms of bleeding or thromboembolism  Other recent changes: No diet, medications, lifestyle changes endorsed by tthe patient at this visit.  Anticoagulation Episode Summary     Current INR goal:  2.0-3.0  TTR:  81.4 % (9 y)  Next INR check:  04/15/2021  INR from last check:  2.3 (03/22/2021)  Weekly max warfarin dose:    Target end date:  Indefinite  INR check location:  Anticoagulation Clinic  Preferred lab:    Send INR reminders to:  [facility]   Indications   History of venous thromboembolism [Z86.718]        Comments:           No Known Allergies  Current Outpatient Medications:    aspirin EC 81 MG tablet, Take 81 mg by mouth daily., Disp: , Rfl:    atorvastatin (LIPITOR) 10 MG tablet, Take 1 tablet (10 mg total) by mouth daily., Disp: 90 tablet, Rfl: 3   fesoterodine (TOVIAZ) 8 MG TB24 tablet, Take 1 tablet (8 mg total) by mouth daily., Disp: 90 tablet, Rfl: 3   mirabegron ER (MYRBETRIQ) 50 MG TB24 tablet, Take 50 mg by mouth daily., Disp: , Rfl:    Multiple Vitamin (MULTIVITAMIN) tablet, Take 1 tablet by mouth daily., Disp: , Rfl:    propranolol ER (INDERAL LA) 60 MG 24 hr capsule, Take 1 capsule by mouth once daily (Patient taking differently: Take 60 mg by mouth daily.), Disp: 90 capsule, Rfl: 1   sertraline (ZOLOFT) 50 MG tablet, Take 1 tablet (50 mg total) by mouth daily., Disp: 90 tablet, Rfl: 3   tamsulosin (FLOMAX) 0.4 MG CAPS capsule, Take 0.4 mg by mouth daily., Disp: , Rfl:    warfarin (COUMADIN) 5 MG tablet, TAKE 1 TABLET BY MOUTH ONCE DAILY AT 6 IN THE EVENING EXCEPT ON Monday  and Thursday TAKE 1/2 TABLET (Patient taking differently: Take 2.5-5 mg by mouth See admin instructions. 2.5 mg on Wed and Fri, 5 mg all other days), Disp: 100 tablet, Rfl: 0   zolpidem (AMBIEN) 5 MG tablet, Take 5 mg by mouth at bedtime as needed for sleep., Disp: , Rfl:  Past Medical History:  Diagnosis Date   Aortic atherosclerosis ([place]) 10/23/2017   Asymptomatic, seen on CT scan   Bilateral cataracts 05/18/2016   Not visually significant   Cancer ([place])    maxillary   Cerebrovascular accident (CVA) ([place]) 03/27/2020   Chronic constipation 06/12/2013   Chronic low back pain 06/12/2013   L4-5 facet arthropathy    Chronic pain of both shoulders 12/07/2013   A/C joint osteoarthritis    Cluster headaches    Resolved in 2006   COPD (chronic obstructive pulmonary disease) ([place])    Dry eye syndrome, bilateral 59/74/1638   Embolic stroke involving right middle cerebral artery ([place]) 08/08/2004   Occured after traveling from [place] to [place] where he was hospitalized for 1 month with no further work-up or therapy.  Returned to [place] unable to walk and was admitted to [facility] immediately after landing. Presumed embolic per neuro but TEE, bubble study, and hypercoag W/U negative. On indefinite coumadin per patient's informed preference.  Residual effects :  Left spastic hemiplegia. Tx with phenol tibi   Generalized anxiety disorder 08/03/2018   History of kidney stones    Hyperplastic colon polyp 07/27/2012   Excised endoscopically 07/27/2012   Hypertensive retinopathy of both eyes 05/18/2016   Internal and external hemorrhoids without complication 31/54/0086   Seen on colonoscopy 04/03/2007.   Left nephrolithiasis 08/31/2015   With mild left hydronephrosis.  Scheduled to undergo shockwave lithotripsy.   Obesity (BMI 30.0-34.9) 12/07/2013   Osteoarthritis of both knees 01/26/2012   Positive PPD 12/07/2013   Pulmonary embolism ([place]) 09/30/2004   Diagnosed in April 2006 after returning from [place].   Likely provoked as it occurred after a 12 hour plane flight within 2 months of R MCA CVA with left hemiparesis. Patient has made an informed decision to remain on lifelong warfarin.    Tubular adenoma 12/19/2017   Endoscopically excised 04/03/2007.   Urge incontinence of urine 08/31/2015   Possibly secondary to prior CVA.  Treated with Myrbetriq.   Social History   Socioeconomic History   Marital status: Single    Spouse name: Not on file   Number of children: 3   Years of education: 16   Highest education level: Not on file  Occupational History   Occupation: Retired  Tobacco Use   Smoking status: Former    Types: Cigarettes    Quit date: 06/28/2007    Years since quitting: 13.7   Smokeless tobacco: Never  Vaping Use   Vaping Use: Never used  Substance and Sexual Activity   Alcohol use: Not Currently    Comment: Rarely.   Drug use: No   Sexual activity: Not on file  Other Topics Concern   Not on file  Social History Narrative   Born in [place] but has been in the [place] for > 30 years.  Divorced, 1 daughter and 2 sons.  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   Right handed   Coffee/tea sometimes, soda rarely   Social Determinants of Health   Financial Resource Strain: Not on file  Food Insecurity: Not on file  Transportation Needs: Not on file  Physical Activity: Not on file  Stress: Not on file  Social Connections: Not on file   Family History  Problem Relation Age of Onset   Stroke Maternal Grandmother    Unexplained death Mother    Depression Mother        After husband's death   Unexplained death Father    Osteoarthritis Father        Knees   Early death Sister    Seizures Sister    Early death Brother 59       Unknown cause   Healthy Daughter    Healthy Son    Stroke Maternal Aunt    Healthy Sister    Healthy Sister    Unexplained death Brother    Healthy Brother    Healthy Son     ASSESSMENT Recent  Results: The most recent result is correlated with 30 mg per week: Lab Results  Component Value Date   INR 2.3 03/22/2021   INR 1.2 02/22/2021   INR 2.1 02/08/2021    Anticoagulation Dosing: Description   Take one of your 5mg  peach-colored warfarin tablets by mouth, once-daily--EXCEPT on Wednesdays and Fridays, take ONLY 1/2 tablet.        INR today: Therapeutic  PLAN Weekly dose was unchanged.   Patient Instructions  Patient instructed to take  medications as defined in the Anti-coagulation Track section of this encounter.  Patient instructed to take today's dose.  Patient instructed to take one of your 5mg  peach-colored warfarin tablets by mouth, once-daily--EXCEPT on Wednesdays and Fridays, take ONLY 1/2 tablet. Patient verbalized understanding of these instructions.   Patient advised to contact clinic or seek medical attention if signs/symptoms of bleeding or thromboembolism occur.  Patient verbalized understanding by repeating back information and was advised to contact me if further medication-related questions arise. Patient was also provided an information handout.  Follow-up Return in 24 days (on 04/15/2021).  [name], PharmD, CPP  15 minutes spent face-to-face with the patient during the encounter. 50% of time spent on education, including signs/sx bleeding and clotting, as well as food and drug interactions with warfarin. 50% of time was spent on fingerprick POC INR sample collection,processing, results determination, and documentation in http://www.kim.net/.

## 2021-04-08 ENCOUNTER — Other Ambulatory Visit: Payer: Self-pay

## 2021-04-08 DIAGNOSIS — Z86711 Personal history of pulmonary embolism: Secondary | ICD-10-CM

## 2021-04-08 MED ORDER — WARFARIN SODIUM 5 MG PO TABS: ORAL_TABLET | ORAL | 0 refills | Status: DC

## 2021-04-08 NOTE — Telephone Encounter (Signed)
warfarin (COUMADIN) 5 MG tablet, REFILL REQUEST @ [facility] ([facility]), [place].

## 2021-04-09 ENCOUNTER — Other Ambulatory Visit: Payer: Self-pay | Admitting: Pharmacist

## 2021-04-09 DIAGNOSIS — Z86711 Personal history of pulmonary embolism: Secondary | ICD-10-CM

## 2021-04-09 MED ORDER — WARFARIN SODIUM 5 MG PO TABS: ORAL_TABLET | ORAL | 0 refills | Status: DC

## 2021-04-13 ENCOUNTER — Other Ambulatory Visit: Payer: Self-pay | Admitting: Pharmacist

## 2021-04-13 DIAGNOSIS — Z86711 Personal history of pulmonary embolism: Secondary | ICD-10-CM

## 2021-04-13 MED ORDER — WARFARIN SODIUM 5 MG PO TABS: ORAL_TABLET | ORAL | 0 refills | Status: DC

## 2021-04-15 ENCOUNTER — Ambulatory Visit (INDEPENDENT_AMBULATORY_CARE_PROVIDER_SITE_OTHER): Payer: Medicare HMO | Admitting: Internal Medicine

## 2021-04-15 ENCOUNTER — Other Ambulatory Visit: Payer: Self-pay

## 2021-04-15 ENCOUNTER — Encounter: Payer: Self-pay | Admitting: Internal Medicine

## 2021-04-15 ENCOUNTER — Ambulatory Visit (INDEPENDENT_AMBULATORY_CARE_PROVIDER_SITE_OTHER): Payer: Medicare HMO | Admitting: Pharmacist

## 2021-04-15 VITALS — BP 128/67 | HR 63 | Temp 98.2°F | Resp 18 | Ht 72.0 in | Wt 249.4 lb

## 2021-04-15 DIAGNOSIS — R7303 Prediabetes: Secondary | ICD-10-CM | POA: Diagnosis not present

## 2021-04-15 DIAGNOSIS — Z86718 Personal history of other venous thrombosis and embolism: Secondary | ICD-10-CM | POA: Diagnosis not present

## 2021-04-15 DIAGNOSIS — Z0000 Encounter for general adult medical examination without abnormal findings: Secondary | ICD-10-CM

## 2021-04-15 DIAGNOSIS — M25552 Pain in left hip: Secondary | ICD-10-CM

## 2021-04-15 DIAGNOSIS — Z7901 Long term (current) use of anticoagulants: Secondary | ICD-10-CM

## 2021-04-15 DIAGNOSIS — G8110 Spastic hemiplegia affecting unspecified side: Secondary | ICD-10-CM | POA: Diagnosis not present

## 2021-04-15 DIAGNOSIS — I700 Atherosclerosis of aorta: Secondary | ICD-10-CM

## 2021-04-15 LAB — POCT INR: INR: 2.9 (ref 2.0–3.0)

## 2021-04-15 NOTE — Patient Instructions (Signed)
Patient instructed to take medications as defined in the Anti-coagulation Track section of this encounter.  Patient instructed to take today's dose.  Patient instructed to take one of your 5mg peach-colored warfarin tablets by mouth, once-daily--EXCEPT on Mondays, Wednesdays and Fridays, take ONLY 1/2 tablet.  Patient verbalized understanding of these instructions.   

## 2021-04-15 NOTE — Progress Notes (Signed)
  Subjective:  HPI: Alexander English is a 65 y.o. male who presents for Left hip pain, arthritis, hematuria.  He is recently been following with orthopedics he received a trochanteric bursal injection which helped some back in June.  Review of those notes say that an MRI of spine was planned follow-up afterwards would dictate referral for PT, versus spinal injections versus evaluation by spine orthopedics.  He believes he did obtain the MRI but has not seen orthopedics back.  His weight has gone up a couple pounds since last time he is trying to watch his food intake he is concerned about progression of prediabetes to diabetes.  Since our last visit he did follow-up with urology for gross hematuria a cystoscopy was completed and was normal he has not had further recurrence of hematuria.  He has continued to have some erectile dysfunction but he reports this is not bothersome.  Please see Assessment and Plan below for the status of his chronic medical problems.  Objective:  Physical Exam: Vitals:   04/15/21 1022  BP: 128/67  Pulse: 63  Resp: 18  Temp: 98.2 F (36.8 C)  TempSrc: Oral  SpO2: 99%  Weight: 249 lb 6.4 oz (113.1 kg)  Height: 6' (1.829 m)   Body mass index is 33.82 kg/m. Physical Exam Vitals and nursing note reviewed.  Cardiovascular:     Rate and Rhythm: Normal rate. Rhythm irregular.  Pulmonary:     Effort: Pulmonary effort is normal.     Breath sounds: Normal breath sounds.  Musculoskeletal:     Comments: Left leg in brace, mild left trochenteric tenderness  Psychiatric:        Mood and Affect: Mood normal.        Behavior: Behavior normal.   Assessment & Plan:  See Encounters Tab for problem based charting.  Medications Ordered No orders of the defined types were placed in this encounter.  Other Orders Orders Placed This Encounter  Procedures   CT CHEST LUNG CA SCREEN LOW DOSE W/O CM    Standing Status:   Future    Standing Expiration Date:    04/15/2022    Order Specific Question:   Reason for Exam (SYMPTOM  OR DIAGNOSIS REQUIRED)    Answer:   Lung cancer screening 30 pack year, quit 10 years ago    Order Specific Question:   Preferred Imaging Location?    Answer:   [facility]   Follow Up: Return in about 6 months (around 10/14/2021).

## 2021-04-15 NOTE — Patient Instructions (Addendum)
I want you to call Dr Damita Dunnings office about a follow up for your continued left hip pain [phone], they had wanted to see you back after the MRI about PT versus spine injection.  COVID-19 Vaccine Information can be found at: [email or web address].uk For questions related to vaccine distribution or appointments, please email [email or web address] or call [phone].

## 2021-04-15 NOTE — Progress Notes (Signed)
Anticoagulation Management Alexander English is a 65 y.o. male who reports to the clinic for monitoring of warfarin treatment.    Indication: DVT , History of; Long term current use of warfarin oral anticoagulant to maintain INR 2.0 - 3.0.   Duration: indefinite Supervising physician: Joni Reining  Anticoagulation Clinic Visit History: Patient does not report signs/symptoms of bleeding or thromboembolism  Other recent changes: No diet, medications, lifestyle changes endorsed by the patient at this visit to me.  Anticoagulation Episode Summary     Current INR goal:  2.0-3.0  TTR:  81.5 % (9.1 y)  Next INR check:  05/17/2021  INR from last check:  2.9 (04/15/2021)  Weekly max warfarin dose:    Target end date:  Indefinite  INR check location:  Anticoagulation Clinic  Preferred lab:    Send INR reminders to:  [facility]   Indications   History of venous thromboembolism [Z86.718]        Comments:           No Known Allergies  Current Outpatient Medications:    aspirin EC 81 MG tablet, Take 81 mg by mouth daily., Disp: , Rfl:    atorvastatin (LIPITOR) 10 MG tablet, Take 1 tablet (10 mg total) by mouth daily., Disp: 90 tablet, Rfl: 3   fesoterodine (TOVIAZ) 8 MG TB24 tablet, Take 1 tablet (8 mg total) by mouth daily., Disp: 90 tablet, Rfl: 3   mirabegron ER (MYRBETRIQ) 50 MG TB24 tablet, Take 50 mg by mouth daily., Disp: , Rfl:    Multiple Vitamin (MULTIVITAMIN) tablet, Take 1 tablet by mouth daily., Disp: , Rfl:    propranolol ER (INDERAL LA) 60 MG 24 hr capsule, Take 1 capsule by mouth once daily (Patient taking differently: Take 60 mg by mouth daily.), Disp: 90 capsule, Rfl: 1   sertraline (ZOLOFT) 50 MG tablet, Take 1 tablet (50 mg total) by mouth daily., Disp: 90 tablet, Rfl: 3   tamsulosin (FLOMAX) 0.4 MG CAPS capsule, Take 0.4 mg by mouth daily., Disp: , Rfl:    warfarin (COUMADIN) 5 MG tablet, TAKE 1 TABLET BY MOUTH ONCE DAILY AT 6 IN THE EVENING EXCEPT ON Wednesday  AND Friday,  TAKE  ONLY 1/2 TABLET, Disp: 100 tablet, Rfl: 0   zolpidem (AMBIEN) 5 MG tablet, Take 5 mg by mouth at bedtime as needed for sleep., Disp: , Rfl:  Past Medical History:  Diagnosis Date   Aortic atherosclerosis ([place]) 10/23/2017   Asymptomatic, seen on CT scan   Bilateral cataracts 05/18/2016   Not visually significant   Cancer ([place])    maxillary   Cerebrovascular accident (CVA) ([place]) 03/27/2020   Chronic constipation 06/12/2013   Chronic low back pain 06/12/2013   L4-5 facet arthropathy    Chronic pain of both shoulders 12/07/2013   A/C joint osteoarthritis    Cluster headaches    Resolved in 2006   COPD (chronic obstructive pulmonary disease) ([place])    Dry eye syndrome, bilateral 42/59/5638   Embolic stroke involving right middle cerebral artery ([place]) 08/08/2004   Occured after traveling from [place] to [place] where he was hospitalized for 1 month with no further work-up or therapy.  Returned to [place] unable to walk and was admitted to [facility] immediately after landing. Presumed embolic per neuro but TEE, bubble study, and hypercoag W/U negative. On indefinite coumadin per patient's informed preference.  Residual effects : Left spastic hemiplegia. Tx with phenol tibi   Generalized anxiety disorder 08/03/2018   History of kidney  stones    Hyperplastic colon polyp 07/27/2012   Excised endoscopically 07/27/2012   Hypertensive retinopathy of both eyes 05/18/2016   Internal and external hemorrhoids without complication 26/94/8546   Seen on colonoscopy 04/03/2007.   Left nephrolithiasis 08/31/2015   With mild left hydronephrosis.  Scheduled to undergo shockwave lithotripsy.   Obesity (BMI 30.0-34.9) 12/07/2013   Osteoarthritis of both knees 01/26/2012   Positive PPD 12/07/2013   Pulmonary embolism ([place]) 09/30/2004   Diagnosed in April 2006 after returning from [place].  Likely provoked as it occurred after a 12 hour plane flight within 2 months of R MCA CVA with left hemiparesis. Patient  has made an informed decision to remain on lifelong warfarin.    Tubular adenoma 12/19/2017   Endoscopically excised 04/03/2007.   Urge incontinence of urine 08/31/2015   Possibly secondary to prior CVA.  Treated with Myrbetriq.   Social History   Socioeconomic History   Marital status: Single    Spouse name: Not on file   Number of children: 3   Years of education: 16   Highest education level: Not on file  Occupational History   Occupation: Retired  Tobacco Use   Smoking status: Former    Types: Cigarettes    Quit date: 06/28/2007    Years since quitting: 13.8   Smokeless tobacco: Never  Vaping Use   Vaping Use: Never used  Substance and Sexual Activity   Alcohol use: Not Currently    Comment: Rarely.   Drug use: No   Sexual activity: Not on file  Other Topics Concern   Not on file  Social History Narrative   Born in [place] but has been in the [place] for > 30 years.  Divorced, 1 daughter and 2 sons.  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   Right handed   Coffee/tea sometimes, soda rarely   Social Determinants of Health   Financial Resource Strain: Not on file  Food Insecurity: Not on file  Transportation Needs: Not on file  Physical Activity: Not on file  Stress: Not on file  Social Connections: Not on file   Family History  Problem Relation Age of Onset   Stroke Maternal Grandmother    Unexplained death Mother    Depression Mother        After husband's death   Unexplained death Father    Osteoarthritis Father        Knees   Early death Sister    Seizures Sister    Early death Brother 66       Unknown cause   Healthy Daughter    Healthy Son    Stroke Maternal Aunt    Healthy Sister    Healthy Sister    Unexplained death Brother    Healthy Brother    Healthy Son     ASSESSMENT Recent Results: The most recent result is correlated with 30 mg per week: Lab Results  Component Value Date   INR 2.9  04/15/2021   INR 2.3 03/22/2021   INR 1.2 02/22/2021    Anticoagulation Dosing: Description   Take one of your 5mg  peach-colored warfarin tablets by mouth, once-daily--EXCEPT on Mondays, Wednesdays and Fridays, take ONLY 1/2 tablet.        INR today: Therapeutic  PLAN Weekly dose was decreased by 8% to 27.5 mg per week  Patient Instructions  Patient instructed to take medications as defined in the Anti-coagulation Track section of this encounter.  Patient instructed to take today's dose.  Patient instructed to take one of your 5mg  peach-colored warfarin tablets by mouth, once-daily--EXCEPT on Mondays, Wednesdays and Fridays, take ONLY 1/2 tablet.  Patient verbalized understanding of these instructions.   Patient advised to contact clinic or seek medical attention if signs/symptoms of bleeding or thromboembolism occur.  Patient verbalized understanding by repeating back information and was advised to contact me if further medication-related questions arise. Patient was also provided an information handout.  Follow-up Return in 1 month (on 05/17/2021) for Follow up INR.  [name], PharmD, CPP  15 minutes spent face-to-face with the patient during the encounter. 50% of time spent on education, including signs/sx bleeding and clotting, as well as food and drug interactions with warfarin. 50% of time was spent on fingerprick POC INR sample collection,processing, results determination, and documentation in [email or web address]/.

## 2021-04-16 NOTE — Assessment & Plan Note (Signed)
Aortic atherosclerosis remains present.  He has been taking his atorvastatin 10 mg daily.  This is stable and we will continue our current plans.

## 2021-04-16 NOTE — Assessment & Plan Note (Signed)
I suspect his spastic hemiplegia is contributory to his orthopedic complaints.  I do think that PT would be beneficial however I think he has a good plan with his orthopedic surgeon office for follow-up and I am not aware of the MRI findings therefore I have encouraged him to follow-up with orthopedics and let me know if I can be of further assistance.

## 2021-04-16 NOTE — Assessment & Plan Note (Addendum)
He is due for repeat low-dose Lung CT screening.

## 2021-04-16 NOTE — Assessment & Plan Note (Signed)
Discussed to continue watching his weight and we will recheck an A1c in April 2023 unless he develops symptoms or other overt concern for progression to diabetes.

## 2021-04-16 NOTE — Assessment & Plan Note (Signed)
As above I suspect this is multifactorial and will benefit from ongoing evaluation by his orthopedic surgeon office and likely physical therapy.

## 2021-04-20 DIAGNOSIS — H2513 Age-related nuclear cataract, bilateral: Secondary | ICD-10-CM | POA: Diagnosis not present

## 2021-04-20 DIAGNOSIS — H25013 Cortical age-related cataract, bilateral: Secondary | ICD-10-CM | POA: Diagnosis not present

## 2021-04-20 DIAGNOSIS — H40023 Open angle with borderline findings, high risk, bilateral: Secondary | ICD-10-CM | POA: Diagnosis not present

## 2021-04-20 DIAGNOSIS — H04123 Dry eye syndrome of bilateral lacrimal glands: Secondary | ICD-10-CM | POA: Diagnosis not present

## 2021-04-20 DIAGNOSIS — H524 Presbyopia: Secondary | ICD-10-CM | POA: Diagnosis not present

## 2021-04-27 DIAGNOSIS — C310 Malignant neoplasm of maxillary sinus: Secondary | ICD-10-CM | POA: Diagnosis not present

## 2021-05-06 ENCOUNTER — Ambulatory Visit
Admission: RE | Admit: 2021-05-06 | Discharge: 2021-05-06 | Disposition: A | Discharge status: Home / Self Care | Payer: Medicare HMO | Source: Ambulatory Visit | Attending: Internal Medicine | Admitting: Internal Medicine

## 2021-05-06 ENCOUNTER — Other Ambulatory Visit: Payer: Self-pay

## 2021-05-06 DIAGNOSIS — Z0000 Encounter for general adult medical examination without abnormal findings: Secondary | ICD-10-CM

## 2021-05-06 DIAGNOSIS — I700 Atherosclerosis of aorta: Secondary | ICD-10-CM | POA: Diagnosis not present

## 2021-05-06 DIAGNOSIS — I2510 Atherosclerotic heart disease of native coronary artery without angina pectoris: Secondary | ICD-10-CM | POA: Diagnosis not present

## 2021-05-06 DIAGNOSIS — J432 Centrilobular emphysema: Secondary | ICD-10-CM | POA: Diagnosis not present

## 2021-05-06 DIAGNOSIS — Z87891 Personal history of nicotine dependence: Secondary | ICD-10-CM | POA: Diagnosis not present

## 2021-05-17 ENCOUNTER — Ambulatory Visit (INDEPENDENT_AMBULATORY_CARE_PROVIDER_SITE_OTHER): Payer: Medicare HMO | Admitting: Pharmacist

## 2021-05-17 DIAGNOSIS — Z86718 Personal history of other venous thrombosis and embolism: Secondary | ICD-10-CM | POA: Diagnosis not present

## 2021-05-17 DIAGNOSIS — Z7901 Long term (current) use of anticoagulants: Secondary | ICD-10-CM | POA: Diagnosis not present

## 2021-05-17 LAB — POCT INR: INR: 3.1 — AB (ref 2.0–3.0)

## 2021-05-17 NOTE — Patient Instructions (Signed)
Patient instructed to take medications as defined in the Anti-coagulation Track section of this encounter.  Patient instructed to take today's dose.  Patient instructed to take one of your 5mg  peach-colored warfarin tablets by mouth, once-daily--EXCEPT on Sundays, Mondays, Wednesdays and Fridays, take ONLY 1/2 tablet.  Patient verbalized understanding of these instructions.

## 2021-05-17 NOTE — Progress Notes (Signed)
Anticoagulation Management Alexander English is a 65 y.o. male who reports to the clinic for monitoring of warfarin treatment.    Indication: DVT History of with recurrence; long term current use of oral anticoagulant warfarin to target INR 2.0 - 3.0.  Duration: indefinite Supervising physician: Joni Reining  Anticoagulation Clinic Visit History: Patient does not report signs/symptoms of bleeding or thromboembolism  Other recent changes: No diet, medications, lifestyle changes. Anticoagulation Episode Summary     Current INR goal:  2.0-3.0  TTR:  81.2 % (9.1 y)  Next INR check:  06/14/2021  INR from last check:  3.1 (05/17/2021)  Weekly max warfarin dose:    Target end date:  Indefinite  INR check location:  Anticoagulation Clinic  Preferred lab:    Send INR reminders to:  [facility]   Indications   History of venous thromboembolism [Z86.718]        Comments:           No Known Allergies  Current Outpatient Medications:    aspirin EC 81 MG tablet, Take 81 mg by mouth daily., Disp: , Rfl:    atorvastatin (LIPITOR) 10 MG tablet, Take 1 tablet (10 mg total) by mouth daily., Disp: 90 tablet, Rfl: 3   fesoterodine (TOVIAZ) 8 MG TB24 tablet, Take 1 tablet (8 mg total) by mouth daily., Disp: 90 tablet, Rfl: 3   mirabegron ER (MYRBETRIQ) 50 MG TB24 tablet, Take 50 mg by mouth daily., Disp: , Rfl:    Multiple Vitamin (MULTIVITAMIN) tablet, Take 1 tablet by mouth daily., Disp: , Rfl:    propranolol ER (INDERAL LA) 60 MG 24 hr capsule, Take 1 capsule by mouth once daily (Patient taking differently: Take 60 mg by mouth daily.), Disp: 90 capsule, Rfl: 1   sertraline (ZOLOFT) 50 MG tablet, Take 1 tablet (50 mg total) by mouth daily., Disp: 90 tablet, Rfl: 3   tamsulosin (FLOMAX) 0.4 MG CAPS capsule, Take 0.4 mg by mouth daily., Disp: , Rfl:    warfarin (COUMADIN) 5 MG tablet, TAKE 1 TABLET BY MOUTH ONCE DAILY AT 6 IN THE EVENING EXCEPT ON Wednesday AND Friday,  TAKE  ONLY 1/2  TABLET, Disp: 100 tablet, Rfl: 0   zolpidem (AMBIEN) 5 MG tablet, Take 5 mg by mouth at bedtime as needed for sleep., Disp: , Rfl:  Past Medical History:  Diagnosis Date   Aortic atherosclerosis ([place]) 10/23/2017   Asymptomatic, seen on CT scan   Bilateral cataracts 05/18/2016   Not visually significant   Cancer ([place])    maxillary   Cerebrovascular accident (CVA) ([place]) 03/27/2020   Chronic constipation 06/12/2013   Chronic low back pain 06/12/2013   L4-5 facet arthropathy    Chronic pain of both shoulders 12/07/2013   A/C joint osteoarthritis    Cluster headaches    Resolved in 2006   COPD (chronic obstructive pulmonary disease) ([place])    Dry eye syndrome, bilateral 63/06/6008   Embolic stroke involving right middle cerebral artery ([place]) 08/08/2004   Occured after traveling from [place] to [place] where he was hospitalized for 1 month with no further work-up or therapy.  Returned to [place] unable to walk and was admitted to [facility] immediately after landing. Presumed embolic per neuro but TEE, bubble study, and hypercoag W/U negative. On indefinite coumadin per patient's informed preference.  Residual effects : Left spastic hemiplegia. Tx with phenol tibi   Generalized anxiety disorder 08/03/2018   History of kidney stones    Hyperplastic colon polyp 07/27/2012  Excised endoscopically 07/27/2012   Hypertensive retinopathy of both eyes 05/18/2016   Internal and external hemorrhoids without complication 60/63/0160   Seen on colonoscopy 04/03/2007.   Left nephrolithiasis 08/31/2015   With mild left hydronephrosis.  Scheduled to undergo shockwave lithotripsy.   Obesity (BMI 30.0-34.9) 12/07/2013   Osteoarthritis of both knees 01/26/2012   Positive PPD 12/07/2013   Pulmonary embolism ([place]) 09/30/2004   Diagnosed in April 2006 after returning from [place].  Likely provoked as it occurred after a 12 hour plane flight within 2 months of R MCA CVA with left hemiparesis. Patient has made an informed  decision to remain on lifelong warfarin.    Tubular adenoma 12/19/2017   Endoscopically excised 04/03/2007.   Urge incontinence of urine 08/31/2015   Possibly secondary to prior CVA.  Treated with Myrbetriq.   Social History   Socioeconomic History   Marital status: Single    Spouse name: Not on file   Number of children: 3   Years of education: 16   Highest education level: Not on file  Occupational History   Occupation: Retired  Tobacco Use   Smoking status: Former    Types: Cigarettes    Quit date: 06/28/2007    Years since quitting: 13.8   Smokeless tobacco: Never  Vaping Use   Vaping Use: Never used  Substance and Sexual Activity   Alcohol use: Not Currently    Comment: Rarely.   Drug use: No   Sexual activity: Not on file  Other Topics Concern   Not on file  Social History Narrative   Born in [place] but has been in the [place] for > 30 years.  Divorced, 1 daughter and 2 sons.  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   Right handed   Coffee/tea sometimes, soda rarely   Social Determinants of Health   Financial Resource Strain: Not on file  Food Insecurity: Not on file  Transportation Needs: Not on file  Physical Activity: Not on file  Stress: Not on file  Social Connections: Not on file   Family History  Problem Relation Age of Onset   Stroke Maternal Grandmother    Unexplained death Mother    Depression Mother        After husband's death   Unexplained death Father    Osteoarthritis Father        Knees   Early death Sister    Seizures Sister    Early death Brother 39       Unknown cause   Healthy Daughter    Healthy Son    Stroke Maternal Aunt    Healthy Sister    Healthy Sister    Unexplained death Brother    Healthy Brother    Healthy Son     ASSESSMENT Recent Results: The most recent result is correlated with 27.5 mg per week: Lab Results  Component Value Date   INR 3.1 (A) 05/17/2021   INR 2.9  04/15/2021   INR 2.3 03/22/2021    Anticoagulation Dosing: Description   Take one of your 5mg  peach-colored warfarin tablets by mouth, once-daily--EXCEPT on Sundays, Mondays, Wednesdays and Fridays, take ONLY 1/2 tablet.        INR today: Therapeutic  PLAN Weekly dose was decreased by 9% to 25 mg per week  Patient Instructions  Patient instructed to take medications as defined in the Anti-coagulation Track section of this encounter.  Patient instructed to take today's dose.  Patient instructed to take one of your 5mg  peach-colored warfarin tablets by mouth, once-daily--EXCEPT on Sundays, Mondays, Wednesdays and Fridays, take ONLY 1/2 tablet.  Patient verbalized understanding of these instructions.   Patient advised to contact clinic or seek medical attention if signs/symptoms of bleeding or thromboembolism occur.  Patient verbalized understanding by repeating back information and was advised to contact me if further medication-related questions arise. Patient was also provided an information handout.  Follow-up Return in 4 weeks (on 06/14/2021) for Follow up INR.  [name], PharmD, CPP  15 minutes spent face-to-face with the patient during the encounter. 50% of time spent on education, including signs/sx bleeding and clotting, as well as food and drug interactions with warfarin. 50% of time was spent on fingerprick POC INR sample collection,processing, results determination, and documentation in [email or web address].[email or web address]/.

## 2021-05-18 ENCOUNTER — Other Ambulatory Visit: Payer: Self-pay

## 2021-05-18 DIAGNOSIS — G252 Other specified forms of tremor: Secondary | ICD-10-CM

## 2021-05-18 MED ORDER — PROPRANOLOL HCL ER 60 MG PO CP24: 60.0000 mg | ORAL_CAPSULE | Freq: Every day | ORAL | 1 refills | Status: DC

## 2021-05-26 NOTE — Progress Notes (Signed)
Evaluation and management procedures were performed by the Clinical Pharmacy Practitioner under my supervision and collaboration. I have reviewed the Practitioner's note and chart, and I agree with the management and plan as documented above. ° °

## 2021-06-14 ENCOUNTER — Ambulatory Visit (INDEPENDENT_AMBULATORY_CARE_PROVIDER_SITE_OTHER): Payer: Medicare HMO | Admitting: Pharmacist

## 2021-06-14 DIAGNOSIS — Z86711 Personal history of pulmonary embolism: Secondary | ICD-10-CM

## 2021-06-14 DIAGNOSIS — Z86718 Personal history of other venous thrombosis and embolism: Secondary | ICD-10-CM

## 2021-06-14 LAB — POCT INR: INR: 2.3 (ref 2.0–3.0)

## 2021-06-14 MED ORDER — WARFARIN SODIUM 5 MG PO TABS: ORAL_TABLET | ORAL | 0 refills | Status: DC

## 2021-06-14 NOTE — Patient Instructions (Signed)
Patient instructed to take medications as defined in the Anti-coagulation Track section of this encounter.  Patient instructed to take today's dose.  Patient instructed to take one of your 5mg  peach-colored warfarin tablets by mouth, once-daily--EXCEPT on Sundays, Mondays, Wednesdays and Fridays, take ONLY 1/2 tablet.  Patient verbalized understanding of these instructions.

## 2021-06-14 NOTE — Progress Notes (Signed)
Evaluation and management procedures were performed by the Clinical Pharmacy Practitioner under my supervision and collaboration. I have reviewed the Practitioner's note and chart, and I agree with the management and plan as documented above. ° °

## 2021-06-14 NOTE — Progress Notes (Signed)
Anticoagulation Management Alexander English is a 65 y.o. male who reports to the clinic for monitoring of warfarin treatment.    Indication: DVT , history of;  long term current use of oral anticoagulant warfarin to keep to target INR 2.0 - 3.0.  Duration: indefinite Supervising physician: Joni Reining  Anticoagulation Clinic Visit History: Patient does not report signs/symptoms of bleeding or thromboembolism  Other recent changes: No diet, medications, lifestyle changes endorsed by the patient at this visit.  Anticoagulation Episode Summary     Current INR goal:  2.0-3.0  TTR:  81.2 % (9.2 y)  Next INR check:  07/19/2021  INR from last check:  2.3 (06/14/2021)  Weekly max warfarin dose:    Target end date:  Indefinite  INR check location:  Anticoagulation Clinic  Preferred lab:    Send INR reminders to:  [facility]   Indications   History of venous thromboembolism [Z86.718]        Comments:           No Known Allergies  Current Outpatient Medications:    aspirin EC 81 MG tablet, Take 81 mg by mouth daily., Disp: , Rfl:    atorvastatin (LIPITOR) 10 MG tablet, Take 1 tablet (10 mg total) by mouth daily., Disp: 90 tablet, Rfl: 3   fesoterodine (TOVIAZ) 8 MG TB24 tablet, Take 1 tablet (8 mg total) by mouth daily., Disp: 90 tablet, Rfl: 3   mirabegron ER (MYRBETRIQ) 50 MG TB24 tablet, Take 50 mg by mouth daily., Disp: , Rfl:    Multiple Vitamin (MULTIVITAMIN) tablet, Take 1 tablet by mouth daily., Disp: , Rfl:    propranolol ER (INDERAL LA) 60 MG 24 hr capsule, Take 1 capsule (60 mg total) by mouth daily., Disp: 90 capsule, Rfl: 1   sertraline (ZOLOFT) 50 MG tablet, Take 1 tablet (50 mg total) by mouth daily., Disp: 90 tablet, Rfl: 3   tamsulosin (FLOMAX) 0.4 MG CAPS capsule, Take 0.4 mg by mouth daily., Disp: , Rfl:    zolpidem (AMBIEN) 5 MG tablet, Take 5 mg by mouth at bedtime as needed for sleep., Disp: , Rfl:    warfarin (COUMADIN) 5 MG tablet, TAKE 1/2 DAILY--EXCEPT  ON TUESDAYS, THURSDAYS, AND SATURDAYS--TAKE ONE (1) TABLET ON THESE DAYS., Disp: 100 tablet, Rfl: 0 Past Medical History:  Diagnosis Date   Aortic atherosclerosis ([place]) 10/23/2017   Asymptomatic, seen on CT scan   Bilateral cataracts 05/18/2016   Not visually significant   Cancer ([place])    maxillary   Cerebrovascular accident (CVA) ([place]) 03/27/2020   Chronic constipation 06/12/2013   Chronic low back pain 06/12/2013   L4-5 facet arthropathy    Chronic pain of both shoulders 12/07/2013   A/C joint osteoarthritis    Cluster headaches    Resolved in 2006   COPD (chronic obstructive pulmonary disease) ([place])    Dry eye syndrome, bilateral 76/22/6333   Embolic stroke involving right middle cerebral artery ([place]) 08/08/2004   Occured after traveling from [place] to [place] where he was hospitalized for 1 month with no further work-up or therapy.  Returned to [place] unable to walk and was admitted to [facility] immediately after landing. Presumed embolic per neuro but TEE, bubble study, and hypercoag W/U negative. On indefinite coumadin per patient's informed preference.  Residual effects : Left spastic hemiplegia. Tx with phenol tibi   Generalized anxiety disorder 08/03/2018   History of kidney stones    Hyperplastic colon polyp 07/27/2012   Excised endoscopically 07/27/2012   Hypertensive  retinopathy of both eyes 05/18/2016   Internal and external hemorrhoids without complication 44/08/4740   Seen on colonoscopy 04/03/2007.   Left nephrolithiasis 08/31/2015   With mild left hydronephrosis.  Scheduled to undergo shockwave lithotripsy.   Obesity (BMI 30.0-34.9) 12/07/2013   Osteoarthritis of both knees 01/26/2012   Positive PPD 12/07/2013   Pulmonary embolism ([place]) 09/30/2004   Diagnosed in April 2006 after returning from [place].  Likely provoked as it occurred after a 12 hour plane flight within 2 months of R MCA CVA with left hemiparesis. Patient has made an informed decision to remain on lifelong  warfarin.    Tubular adenoma 12/19/2017   Endoscopically excised 04/03/2007.   Urge incontinence of urine 08/31/2015   Possibly secondary to prior CVA.  Treated with Myrbetriq.   Social History   Socioeconomic History   Marital status: Single    Spouse name: Not on file   Number of children: 3   Years of education: 16   Highest education level: Not on file  Occupational History   Occupation: Retired  Tobacco Use   Smoking status: Former    Types: Cigarettes    Quit date: 06/28/2007    Years since quitting: 13.9   Smokeless tobacco: Never  Vaping Use   Vaping Use: Never used  Substance and Sexual Activity   Alcohol use: Not Currently    Comment: Rarely.   Drug use: No   Sexual activity: Not on file  Other Topics Concern   Not on file  Social History Narrative   Born in [place] but has been in the [place] for > 30 years.  Divorced, 1 daughter and 2 sons.  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   Right handed   Coffee/tea sometimes, soda rarely   Social Determinants of Health   Financial Resource Strain: Not on file  Food Insecurity: Not on file  Transportation Needs: Not on file  Physical Activity: Not on file  Stress: Not on file  Social Connections: Not on file   Family History  Problem Relation Age of Onset   Stroke Maternal Grandmother    Unexplained death Mother    Depression Mother        After husband's death   Unexplained death Father    Osteoarthritis Father        Knees   Early death Sister    Seizures Sister    Early death Brother 36       Unknown cause   Healthy Daughter    Healthy Son    Stroke Maternal Aunt    Healthy Sister    Healthy Sister    Unexplained death Brother    Healthy Brother    Healthy Son     ASSESSMENT Recent Results: The most recent result is correlated with 25 mg per week: Lab Results  Component Value Date   INR 2.3 06/14/2021   INR 3.1 (A) 05/17/2021   INR 2.9 04/15/2021     Anticoagulation Dosing: Description   Take one of your 5mg  peach-colored warfarin tablets by mouth, once-daily--EXCEPT on Sundays, Mondays, Wednesdays and Fridays, take ONLY 1/2 tablet.        INR today: Therapeutic  PLAN Weekly dose was unchanged. Continue to take one tablet on Tuesdays, Thursdays and Saturdays. Take only 1/2 tablet all other days.   Patient Instructions  Patient instructed to take medications as defined in the Anti-coagulation Track section of this encounter.  Patient instructed  to take today's dose.  Patient instructed to take one of your 5mg  peach-colored warfarin tablets by mouth, once-daily--EXCEPT on Sundays, Mondays, Wednesdays and Fridays, take ONLY 1/2 tablet.  Patient verbalized understanding of these instructions.   Patient advised to contact clinic or seek medical attention if signs/symptoms of bleeding or thromboembolism occur.  Patient verbalized understanding by repeating back information and was advised to contact me if further medication-related questions arise. Patient was also provided an information handout.  Follow-up Return in 5 weeks (on 07/19/2021) for Follow up INR.  [name], PharmD, CPP  15 minutes spent face-to-face with the patient during the encounter. 50% of time spent on education, including signs/sx bleeding and clotting, as well as food and drug interactions with warfarin. 50% of time was spent on fingerprick POC INR sample collection,processing, results determination, and documentation in [email or web address]/.

## 2021-07-06 DIAGNOSIS — M25552 Pain in left hip: Secondary | ICD-10-CM | POA: Diagnosis not present

## 2021-07-06 DIAGNOSIS — M5442 Lumbago with sciatica, left side: Secondary | ICD-10-CM | POA: Diagnosis not present

## 2021-07-19 ENCOUNTER — Ambulatory Visit (INDEPENDENT_AMBULATORY_CARE_PROVIDER_SITE_OTHER): Payer: Medicare HMO | Admitting: Pharmacist

## 2021-07-19 DIAGNOSIS — Z86718 Personal history of other venous thrombosis and embolism: Secondary | ICD-10-CM | POA: Diagnosis not present

## 2021-07-19 DIAGNOSIS — Z7901 Long term (current) use of anticoagulants: Secondary | ICD-10-CM

## 2021-07-19 DIAGNOSIS — Z86711 Personal history of pulmonary embolism: Secondary | ICD-10-CM

## 2021-07-19 LAB — POCT INR: INR: 2.1 (ref 2.0–3.0)

## 2021-07-19 NOTE — Patient Instructions (Signed)
Patient instructed to take medications as defined in the Anti-coagulation Track section of this encounter.  Patient instructed to take today's dose.  Patient instructed to take one of your 5mg peach-colored warfarin tablets by mouth, once-daily--EXCEPT on Mondays, Wednesdays and Fridays, take ONLY 1/2 tablet.  Patient verbalized understanding of these instructions.   

## 2021-07-19 NOTE — Progress Notes (Signed)
Anticoagulation Management Alexander English is a 66 y.o. male who reports to the clinic for monitoring of warfarin treatment.    Indication: DVT, history of; PE, history of; long term current use of oral anticoagulant warfarin with INR target goal 2.0 - 3.0.  Duration: indefinite Supervising physician: Aldine Contes  Anticoagulation Clinic Visit History: Patient does not report signs/symptoms of bleeding or thromboembolism  Other recent changes: No diet, medications, lifestyle cited by the patient at this visit.  Anticoagulation Episode Summary     Current INR goal:  2.0-3.0  TTR:  81.4 % (9.3 y)  Next INR check:  08/16/2021  INR from last check:  2.1 (07/19/2021)  Weekly max warfarin dose:    Target end date:  Indefinite  INR check location:  Anticoagulation Clinic  Preferred lab:    Send INR reminders to:  [facility]   Indications   History of venous thromboembolism [Z86.718]        Comments:           No Known Allergies  Current Outpatient Medications:    aspirin EC 81 MG tablet, Take 81 mg by mouth daily., Disp: , Rfl:    atorvastatin (LIPITOR) 10 MG tablet, Take 1 tablet (10 mg total) by mouth daily., Disp: 90 tablet, Rfl: 3   fesoterodine (TOVIAZ) 8 MG TB24 tablet, Take 1 tablet (8 mg total) by mouth daily., Disp: 90 tablet, Rfl: 3   mirabegron ER (MYRBETRIQ) 50 MG TB24 tablet, Take 50 mg by mouth daily., Disp: , Rfl:    Multiple Vitamin (MULTIVITAMIN) tablet, Take 1 tablet by mouth daily., Disp: , Rfl:    propranolol ER (INDERAL LA) 60 MG 24 hr capsule, Take 1 capsule (60 mg total) by mouth daily., Disp: 90 capsule, Rfl: 1   sertraline (ZOLOFT) 50 MG tablet, Take 1 tablet (50 mg total) by mouth daily., Disp: 90 tablet, Rfl: 3   tamsulosin (FLOMAX) 0.4 MG CAPS capsule, Take 0.4 mg by mouth daily., Disp: , Rfl:    warfarin (COUMADIN) 5 MG tablet, TAKE 1/2 DAILY--EXCEPT ON TUESDAYS, THURSDAYS, AND SATURDAYS--TAKE ONE (1) TABLET ON THESE DAYS., Disp: 100 tablet,  Rfl: 0   zolpidem (AMBIEN) 5 MG tablet, Take 5 mg by mouth at bedtime as needed for sleep., Disp: , Rfl:  Past Medical History:  Diagnosis Date   Aortic atherosclerosis ([place]) 10/23/2017   Asymptomatic, seen on CT scan   Bilateral cataracts 05/18/2016   Not visually significant   Cancer ([place])    maxillary   Cerebrovascular accident (CVA) ([place]) 03/27/2020   Chronic constipation 06/12/2013   Chronic low back pain 06/12/2013   L4-5 facet arthropathy    Chronic pain of both shoulders 12/07/2013   A/C joint osteoarthritis    Cluster headaches    Resolved in 2006   COPD (chronic obstructive pulmonary disease) ([place])    Dry eye syndrome, bilateral 85/07/7739   Embolic stroke involving right middle cerebral artery ([place]) 08/08/2004   Occured after traveling from [place] to [place] where he was hospitalized for 1 month with no further work-up or therapy.  Returned to [place] unable to walk and was admitted to [facility] immediately after landing. Presumed embolic per neuro but TEE, bubble study, and hypercoag W/U negative. On indefinite coumadin per patient's informed preference.  Residual effects : Left spastic hemiplegia. Tx with phenol tibi   Generalized anxiety disorder 08/03/2018   History of kidney stones    Hyperplastic colon polyp 07/27/2012   Excised endoscopically 07/27/2012   Hypertensive retinopathy  of both eyes 05/18/2016   Internal and external hemorrhoids without complication 16/60/6301   Seen on colonoscopy 04/03/2007.   Left nephrolithiasis 08/31/2015   With mild left hydronephrosis.  Scheduled to undergo shockwave lithotripsy.   Obesity (BMI 30.0-34.9) 12/07/2013   Osteoarthritis of both knees 01/26/2012   Positive PPD 12/07/2013   Pulmonary embolism ([place]) 09/30/2004   Diagnosed in April 2006 after returning from [place].  Likely provoked as it occurred after a 12 hour plane flight within 2 months of R MCA CVA with left hemiparesis. Patient has made an informed decision to remain on lifelong  warfarin.    Tubular adenoma 12/19/2017   Endoscopically excised 04/03/2007.   Urge incontinence of urine 08/31/2015   Possibly secondary to prior CVA.  Treated with Myrbetriq.   Social History   Socioeconomic History   Marital status: Single    Spouse name: Not on file   Number of children: 3   Years of education: 16   Highest education level: Not on file  Occupational History   Occupation: Retired  Tobacco Use   Smoking status: Former    Types: Cigarettes    Quit date: 06/28/2007    Years since quitting: 14.0   Smokeless tobacco: Never  Vaping Use   Vaping Use: Never used  Substance and Sexual Activity   Alcohol use: Not Currently    Comment: Rarely.   Drug use: No   Sexual activity: Not on file  Other Topics Concern   Not on file  Social History Narrative   Born in [place] but has been in the [place] for > 30 years.  Divorced, 1 daughter and 2 sons.  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   Right handed   Coffee/tea sometimes, soda rarely   Social Determinants of Health   Financial Resource Strain: Not on file  Food Insecurity: Not on file  Transportation Needs: Not on file  Physical Activity: Not on file  Stress: Not on file  Social Connections: Not on file   Family History  Problem Relation Age of Onset   Stroke Maternal Grandmother    Unexplained death Mother    Depression Mother        After husband's death   Unexplained death Father    Osteoarthritis Father        Knees   Early death Sister    Seizures Sister    Early death Brother 23       Unknown cause   Healthy Daughter    Healthy Son    Stroke Maternal Aunt    Healthy Sister    Healthy Sister    Unexplained death Brother    Healthy Brother    Healthy Son     ASSESSMENT Recent Results: The most recent result is correlated with 25 mg per week: Lab Results  Component Value Date   INR 2.1 07/19/2021   INR 2.3 06/14/2021   INR 3.1 (A) 05/17/2021     Anticoagulation Dosing: Description   Take one of your 5mg  peach-colored warfarin tablets by mouth, once-daily--EXCEPT on Mondays, Wednesdays and Fridays, take ONLY 1/2 tablet.        INR today: Therapeutic  PLAN Weekly dose was increased by 10% to 27.5 mg per week  Patient Instructions  Patient instructed to take medications as defined in the Anti-coagulation Track section of this encounter.  Patient instructed to take today's dose.  Patient instructed to take one of your 5mg   peach-colored warfarin tablets by mouth, once-daily--EXCEPT on Mondays, Wednesdays and Fridays, take ONLY 1/2 tablet.  Patient verbalized understanding of these instructions.   Patient advised to contact clinic or seek medical attention if signs/symptoms of bleeding or thromboembolism occur.  Patient verbalized understanding by repeating back information and was advised to contact me if further medication-related questions arise. Patient was also provided an information handout.  Follow-up Return in 4 weeks (on 08/16/2021) for Follow up INR.  [name], PharmD, CPP  15 minutes spent face-to-face with the patient during the encounter. 50% of time spent on education, including signs/sx bleeding and clotting, as well as food and drug interactions with warfarin. 50% of time was spent on fingerprick POC INR sample collection,processing, results determination, and documentation in [email or web address]/.

## 2021-07-19 NOTE — Progress Notes (Signed)
INTERNAL MEDICINE TEACHING ATTENDING ADDENDUM - Alexander English M.D  Duration- indefinite, Indication- PE, INR- therapeutic. Agree with pharmacy recommendations as outlined in their note.     

## 2021-07-28 ENCOUNTER — Other Ambulatory Visit: Payer: Self-pay

## 2021-07-28 DIAGNOSIS — E785 Hyperlipidemia, unspecified: Secondary | ICD-10-CM

## 2021-07-28 MED ORDER — ATORVASTATIN CALCIUM 10 MG PO TABS: 10.0000 mg | ORAL_TABLET | Freq: Every day | ORAL | 3 refills | Status: DC

## 2021-08-16 ENCOUNTER — Ambulatory Visit (INDEPENDENT_AMBULATORY_CARE_PROVIDER_SITE_OTHER): Payer: Medicare HMO | Admitting: Pharmacist

## 2021-08-16 DIAGNOSIS — Z86718 Personal history of other venous thrombosis and embolism: Secondary | ICD-10-CM

## 2021-08-16 DIAGNOSIS — Z7901 Long term (current) use of anticoagulants: Secondary | ICD-10-CM | POA: Diagnosis not present

## 2021-08-16 DIAGNOSIS — Z86711 Personal history of pulmonary embolism: Secondary | ICD-10-CM

## 2021-08-16 LAB — POCT INR: INR: 2 (ref 2.0–3.0)

## 2021-08-16 MED ORDER — WARFARIN SODIUM 5 MG PO TABS: ORAL_TABLET | ORAL | 0 refills | Status: DC

## 2021-08-16 NOTE — Progress Notes (Signed)
INTERNAL MEDICINE TEACHING ATTENDING ADDENDUM - Alexander English M.D  Duration- indefinite, Indication- PE, INR- therapeutic. Agree with pharmacy recommendations as outlined in their note.     

## 2021-08-16 NOTE — Patient Instructions (Signed)
Patient instructed to take medications as defined in the Anti-coagulation Track section of this encounter.  Patient instructed to take today's dose.  Patient instructed to take one of your 5mg  peach-colored warfarin tablets by mouth, once-daily--EXCEPT on Wednesdays and Saturdays, take ONLY 1/2 tablet.  Patient verbalized understanding of these instructions.

## 2021-08-16 NOTE — Progress Notes (Signed)
Anticoagulation Management Alexander English is a 66 y.o. male who reports to the clinic for monitoring of warfarin treatment.    Indication: DVT and PE , history of; long term current use of warfarin oral anticoagulant to targeted INR 2.0 - 3.0.  Duration: indefinite Supervising physician: Aldine Contes  Anticoagulation Clinic Visit History: Patient does not report signs/symptoms of bleeding or thromboembolism  Other recent changes: No diet, medications, lifestyle changes.  Anticoagulation Episode Summary     Current INR goal:  2.0-3.0  TTR:  81.6 % (9.4 y)  Next INR check:  09/13/2021  INR from last check:  2.0 (08/16/2021)  Weekly max warfarin dose:    Target end date:  Indefinite  INR check location:  Anticoagulation Clinic  Preferred lab:    Send INR reminders to:  [facility]   Indications   History of venous thromboembolism [Z86.718]        Comments:           No Known Allergies  Current Outpatient Medications:    aspirin EC 81 MG tablet, Take 81 mg by mouth daily., Disp: , Rfl:    atorvastatin (LIPITOR) 10 MG tablet, Take 1 tablet (10 mg total) by mouth daily., Disp: 90 tablet, Rfl: 3   fesoterodine (TOVIAZ) 8 MG TB24 tablet, Take 1 tablet (8 mg total) by mouth daily., Disp: 90 tablet, Rfl: 3   mirabegron ER (MYRBETRIQ) 50 MG TB24 tablet, Take 50 mg by mouth daily., Disp: , Rfl:    Multiple Vitamin (MULTIVITAMIN) tablet, Take 1 tablet by mouth daily., Disp: , Rfl:    propranolol ER (INDERAL LA) 60 MG 24 hr capsule, Take 1 capsule (60 mg total) by mouth daily., Disp: 90 capsule, Rfl: 1   sertraline (ZOLOFT) 50 MG tablet, Take 1 tablet (50 mg total) by mouth daily., Disp: 90 tablet, Rfl: 3   tamsulosin (FLOMAX) 0.4 MG CAPS capsule, Take 0.4 mg by mouth daily., Disp: , Rfl:    warfarin (COUMADIN) 5 MG tablet, TAKE 1/2 DAILY--EXCEPT ON TUESDAYS, THURSDAYS, AND SATURDAYS--TAKE ONE (1) TABLET ON THESE DAYS., Disp: 100 tablet, Rfl: 0   zolpidem (AMBIEN) 5 MG tablet,  Take 5 mg by mouth at bedtime as needed for sleep., Disp: , Rfl:  Past Medical History:  Diagnosis Date   Aortic atherosclerosis ([place]) 10/23/2017   Asymptomatic, seen on CT scan   Bilateral cataracts 05/18/2016   Not visually significant   Cancer ([place])    maxillary   Cerebrovascular accident (CVA) ([place]) 03/27/2020   Chronic constipation 06/12/2013   Chronic low back pain 06/12/2013   L4-5 facet arthropathy    Chronic pain of both shoulders 12/07/2013   A/C joint osteoarthritis    Cluster headaches    Resolved in 2006   COPD (chronic obstructive pulmonary disease) ([place])    Dry eye syndrome, bilateral 75/91/6384   Embolic stroke involving right middle cerebral artery ([place]) 08/08/2004   Occured after traveling from [place] to [place] where he was hospitalized for 1 month with no further work-up or therapy.  Returned to [place] unable to walk and was admitted to [facility] immediately after landing. Presumed embolic per neuro but TEE, bubble study, and hypercoag W/U negative. On indefinite coumadin per patient's informed preference.  Residual effects : Left spastic hemiplegia. Tx with phenol tibi   Generalized anxiety disorder 08/03/2018   History of kidney stones    Hyperplastic colon polyp 07/27/2012   Excised endoscopically 07/27/2012   Hypertensive retinopathy of both eyes 05/18/2016   Internal  and external hemorrhoids without complication 11/91/4782   Seen on colonoscopy 04/03/2007.   Left nephrolithiasis 08/31/2015   With mild left hydronephrosis.  Scheduled to undergo shockwave lithotripsy.   Obesity (BMI 30.0-34.9) 12/07/2013   Osteoarthritis of both knees 01/26/2012   Positive PPD 12/07/2013   Pulmonary embolism ([place]) 09/30/2004   Diagnosed in April 2006 after returning from [place].  Likely provoked as it occurred after a 12 hour plane flight within 2 months of R MCA CVA with left hemiparesis. Patient has made an informed decision to remain on lifelong warfarin.    Tubular adenoma 12/19/2017    Endoscopically excised 04/03/2007.   Urge incontinence of urine 08/31/2015   Possibly secondary to prior CVA.  Treated with Myrbetriq.   Social History   Socioeconomic History   Marital status: Single    Spouse name: Not on file   Number of children: 3   Years of education: 16   Highest education level: Not on file  Occupational History   Occupation: Retired  Tobacco Use   Smoking status: Former    Types: Cigarettes    Quit date: 06/28/2007    Years since quitting: 14.1   Smokeless tobacco: Never  Vaping Use   Vaping Use: Never used  Substance and Sexual Activity   Alcohol use: Not Currently    Comment: Rarely.   Drug use: No   Sexual activity: Not on file  Other Topics Concern   Not on file  Social History Narrative   Born in [place] but has been in the [place] for > 30 years.  Divorced, 1 daughter and 2 sons.  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   Right handed   Coffee/tea sometimes, soda rarely   Social Determinants of Health   Financial Resource Strain: Not on file  Food Insecurity: Not on file  Transportation Needs: Not on file  Physical Activity: Not on file  Stress: Not on file  Social Connections: Not on file   Family History  Problem Relation Age of Onset   Stroke Maternal Grandmother    Unexplained death Mother    Depression Mother        After husband's death   Unexplained death Father    Osteoarthritis Father        Knees   Early death Sister    Seizures Sister    Early death Brother 63       Unknown cause   Healthy Daughter    Healthy Son    Stroke Maternal Aunt    Healthy Sister    Healthy Sister    Unexplained death Brother    Healthy Brother    Healthy Son     ASSESSMENT Recent Results: The most recent result is correlated with 27.5 mg per week: Lab Results  Component Value Date   INR 2.0 08/16/2021   INR 2.1 07/19/2021   INR 2.3 06/14/2021    Anticoagulation Dosing: Description    Take one of your 5mg  peach-colored warfarin tablets by mouth, once-daily--EXCEPT on Wednesdays and Saturdays, take ONLY 1/2 tablet.        INR today: Therapeutic  PLAN Weekly dose was increased by 9% to 30 mg per week  Patient Instructions  Patient instructed to take medications as defined in the Anti-coagulation Track section of this encounter.  Patient instructed to take today's dose.  Patient instructed to take one of your 5mg  peach-colored warfarin tablets by mouth, once-daily--EXCEPT on Wednesdays and  Saturdays, take ONLY 1/2 tablet.  Patient verbalized understanding of these instructions.   Patient advised to contact clinic or seek medical attention if signs/symptoms of bleeding or thromboembolism occur.  Patient verbalized understanding by repeating back information and was advised to contact me if further medication-related questions arise. Patient was also provided an information handout.  Follow-up Return in 4 weeks (on 09/13/2021) for Follow up INR.  [name], PharmD, CPP  15 minutes spent face-to-face with the patient during the encounter. 50% of time spent on education, including signs/sx bleeding and clotting, as well as food and drug interactions with warfarin. 50% of time was spent on fingerprick POC INR sample collection,processing, results determination, and documentation in [email or web address]/.

## 2021-08-23 NOTE — Progress Notes (Signed)
Chief Complaint  Patient presents with   Follow-up    Rm 13. Alone. Pt remains on propranolol SR 24hr 60mg . States right hand tremors have resolved. C/o occasional forgetfulness.    HISTORICAL  Alexander English is a 66 year old male, seen in request by his primary care doctor Sid Falcon, MD For evaluation of right hand tremor, history of stroke, mild cognitive impairment, initial evaluation was March 28, 2019.  I reviewed and summarized the referring note.  Past medical history Hyperlipidemia History of stroke in February 2006 Pulmonary emboli, on chronic Coumadin treatment  He suffered stroke in February 2006 while visiting his family in [place], presented with left arm and leg weakness, slurred speech, went to rehabilitation for 1 month, continued his care at [facility] afterwards,  We were able to review MRI of the brain in April 2006, acute to subacute infarction affecting the right insular, deep white matter of the corona radiata, and the parietal cortex, some area within the stroke had blood product accumulation  MRA of the brain showed decreased flow in the right MCA territory  CT angiogram in April 2006 showed large pulmonary emboli in, mainly to the right middle and lower lobe with segmental involvement of the left lower lobe  He was treated with warfarin since, also take aspirin, he now ambulate with left ankle and knee brace, cane,  Since March 2021, he noticed intermittent right hand tremor, noticed tremor when he holding a pen, but there was no resting tremor, he also complains of mild cognitive impairment, went into a room, would forgot why he is there  Update August 24, 2020 SS: Here today alone, c/o tremor to right hand, holding pin or cup of coffee. Started propranolol 60 mg LA before seeing Dr. Krista Blue, he thinks is helping. Tremor is improved some he thinks. No resting tremor. Mostly notices if he picks up something heavy, can only use right hand, since  stroke to the left.   MRI of the brain October 2021 showed chronic ischemic infarction involving right parietal and periinsular region, moderate supratentorial small vessel disease   RPR was negative, B12 slightly above range 1411. TSH 1.580 in Aug 2021. MMSE was 29/30. In October 2021. Live alone, drives.   Update August 24, 2021 SS: Doing well on Inderal LA 60 mg daily, tolerates well.  Tremor in the right hand is well controlled. Can carry coffee now. Lives alone drives a car. No falls, uses cane. Wearing long leg brace to left leg. Mild tremor in handwriting, spiral draw. He retired this year. Memory is doing well, does her own affairs, medications, appointments well.   REVIEW OF SYSTEMS: Full 14 system review of systems performed and notable only for as above  See HPI  ALLERGIES: No Known Allergies  HOME MEDICATIONS: Current Outpatient Medications  Medication Sig Dispense Refill   aspirin EC 81 MG tablet Take 81 mg by mouth daily.     atorvastatin (LIPITOR) 10 MG tablet Take 1 tablet (10 mg total) by mouth daily. 90 tablet 3   fesoterodine (TOVIAZ) 8 MG TB24 tablet Take 1 tablet (8 mg total) by mouth daily. 90 tablet 3   mirabegron ER (MYRBETRIQ) 50 MG TB24 tablet Take 50 mg by mouth daily.     Multiple Vitamin (MULTIVITAMIN) tablet Take 1 tablet by mouth daily.     propranolol ER (INDERAL LA) 60 MG 24 hr capsule Take 1 capsule (60 mg total) by mouth daily. 90 capsule 1   sertraline (ZOLOFT) 50  MG tablet Take 1 tablet (50 mg total) by mouth daily. 90 tablet 3   tamsulosin (FLOMAX) 0.4 MG CAPS capsule Take 0.4 mg by mouth daily.     traMADol (ULTRAM) 50 MG tablet Take 50 mg by mouth every 6 (six) hours as needed.     warfarin (COUMADIN) 5 MG tablet TAKE ONE (1) TABLET BY MOUTH DAILY--EXCEPT ON WEDNESDAYS AND SATURDAYS, TAKE ONLY ONE-HALF (1/2) TABLET ON WEDNESDAYS AND SATURDAYS. 100 tablet 0   zolpidem (AMBIEN) 5 MG tablet Take 5 mg by mouth at bedtime as needed for sleep.      No current facility-administered medications for this visit.    PAST MEDICAL HISTORY: Past Medical History:  Diagnosis Date   Aortic atherosclerosis ([place]) 10/23/2017   Asymptomatic, seen on CT scan   Bilateral cataracts 05/18/2016   Not visually significant   Cancer ([place])    maxillary   Cerebrovascular accident (CVA) ([place]) 03/27/2020   Chronic constipation 06/12/2013   Chronic low back pain 06/12/2013   L4-5 facet arthropathy    Chronic pain of both shoulders 12/07/2013   A/C joint osteoarthritis    Cluster headaches    Resolved in 2006   COPD (chronic obstructive pulmonary disease) ([place])    Dry eye syndrome, bilateral 00/34/9179   Embolic stroke involving right middle cerebral artery ([place]) 08/08/2004   Occured after traveling from [place] to [place] where he was hospitalized for 1 month with no further work-up or therapy.  Returned to [place] unable to walk and was admitted to [facility] immediately after landing. Presumed embolic per neuro but TEE, bubble study, and hypercoag W/U negative. On indefinite coumadin per patient's informed preference.  Residual effects : Left spastic hemiplegia. Tx with phenol tibi   Generalized anxiety disorder 08/03/2018   History of kidney stones    Hyperplastic colon polyp 07/27/2012   Excised endoscopically 07/27/2012   Hypertensive retinopathy of both eyes 05/18/2016   Internal and external hemorrhoids without complication 15/10/6977   Seen on colonoscopy 04/03/2007.   Left nephrolithiasis 08/31/2015   With mild left hydronephrosis.  Scheduled to undergo shockwave lithotripsy.   Obesity (BMI 30.0-34.9) 12/07/2013   Osteoarthritis of both knees 01/26/2012   Positive PPD 12/07/2013   Pulmonary embolism ([place]) 09/30/2004   Diagnosed in April 2006 after returning from [place].  Likely provoked as it occurred after a 12 hour plane flight within 2 months of R MCA CVA with left hemiparesis. Patient has made an informed decision to remain on lifelong warfarin.     Tubular adenoma 12/19/2017   Endoscopically excised 04/03/2007.   Urge incontinence of urine 08/31/2015   Possibly secondary to prior CVA.  Treated with Myrbetriq.    PAST SURGICAL HISTORY: Past Surgical History:  Procedure Laterality Date   CYSTOSCOPY W/ RETROGRADES Right 02/22/2021   Procedure: CYSTOSCOPY WITH RETROGRADE PYELOGRAM/ URETEROSCOPY/ POSSIBLE BIOPSY OF LESION, POSSIBLE RIGHT STENT PLACEMENT;  Surgeon: Janith Lima, MD;  Location: [facility];  Service: Urology;  Laterality: Right;  ONLY NEEDS 30 MIN   EXCISION NASAL MASS Left 10/06/2017   Procedure: EXCISION LEFT NASAL MASS;  Surgeon: [name], MD;  Location: [facility];  Service: ENT;  Laterality: Left;  Removal of intvering papilloma frozen section   FOOT SURGERY Bilateral    Bunion   I & D EXTREMITY Left 11/28/2001   Left thumb thenar space abscess.   LITHOTRIPSY  2017   MAXILLECTOMY Left 11/22/2017   [name] 11/22/2017   MAXILLECTOMY Left 11/22/2017   Procedure: MAXILLECTOMY;  Surgeon: Constance Holster,  Garret Reddish, MD;  Location: [facility];  Service: ENT;  Laterality: Left;   NASAL SINUS SURGERY Left 10/06/2017   Procedure: ENDOSCOPIC SINUS SURGERY;  Surgeon: [name], MD;  Location: [facility];  Service: ENT;  Laterality: Left;  Left side Endoscopic Macillary and Ethmoid Sinus   SKIN SPLIT GRAFT Left 11/22/2017   Procedure: SKIN GRAFT SPLIT THICKNESS;  Surgeon: [name], MD;  Location: [facility];  Service: ENT;  Laterality: Left;    FAMILY HISTORY: Family History  Problem Relation Age of Onset   Stroke Maternal Grandmother    Unexplained death Mother    Depression Mother        After husband's death   Unexplained death Father    Osteoarthritis Father        Knees   Early death Sister    Seizures Sister    Early death Brother 36       Unknown cause   Healthy Daughter    Healthy Son    Stroke Maternal Aunt    Healthy Sister    Healthy Sister    Unexplained death Brother    Healthy Brother    Healthy Son     SOCIAL HISTORY: Social  History   Socioeconomic History   Marital status: Single    Spouse name: Not on file   Number of children: 3   Years of education: 16   Highest education level: Not on file  Occupational History   Occupation: Retired  Tobacco Use   Smoking status: Former    Types: Cigarettes    Quit date: 06/28/2007    Years since quitting: 14.1   Smokeless tobacco: Never  Vaping Use   Vaping Use: Never used  Substance and Sexual Activity   Alcohol use: Not Currently    Comment: Rarely.   Drug use: No   Sexual activity: Not on file  Other Topics Concern   Not on file  Social History Narrative   Born in [place] but has been in the [place] for > 30 years.  Divorced, 1 daughter and 2 sons.  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   Right handed   Coffee/tea sometimes, soda rarely   Social Determinants of Health   Financial Resource Strain: Not on file  Food Insecurity: Not on file  Transportation Needs: Not on file  Physical Activity: Not on file  Stress: Not on file  Social Connections: Not on file  Intimate Partner Violence: Not on file  PHYSICAL EXAM   Vitals:   08/24/21 1522  BP: 123/75  Pulse: 72  Weight: 239 lb 8 oz (108.6 kg)  Height: 6' (1.829 m)    Not recorded     Body mass index is 32.48 kg/m.  PHYSICAL EXAMNIATION:  Gen: NAD, conversant, well nourised, well groomed                      NEUROLOGICAL EXAM:  MENTAL STATUS: Speech/cognition: MMSE - Mini Mental State Exam 03/27/2020  Orientation to time 5  Orientation to Place 5  Registration 3  Attention/ Calculation 5  Recall 2  Language- name 2 objects 2  Language- repeat 1  Language- follow 3 step command 3  Language- read & follow direction 1  Write a sentence 1  Copy design 1  Total score 29   CRANIAL NERVES: CN II: Visual fields are full to confrontation. Pupils are round equal and briskly reactive to light. CN  III, IV, VI: extraocular movement are  normal. No ptosis. CN V: Facial sensation is intact to light touch CN VII: Face is symmetric with normal eye closure  CN VIII: Hearing is normal to causal conversation. CN IX, X: Phonation is normal. CN XI: Head turning and shoulder shrug are intact  MOTOR: Spastic left hemiparesis arm and leg, wearing left full leg brace, left hand in flexion, antigravity movement to arm and leg   Right upper extremity normal muscle tone, strength, no rigidity, no bradykinesia noted today. Handwriting sample shows mild tremor translated.  REFLEXES: Hyperreflexia at left upper and lower extremity.  SENSORY: Intact to light touch to face, arms, legs  COORDINATION: Finger-nose-finger and heel-to-shin is intact with the right, there is no tremor  GAIT/STANCE Has to push off from seated position to stand, has to drag the left leg, relies on cane  DIAGNOSTIC DATA (LABS, IMAGING, TESTING) - I reviewed patient records, labs, notes, testing and imaging myself where available.  ASSESSMENT AND PLAN  ALEXIZ SUSTAITA is a 66 y.o. male   1. Right MCA stroke in 2006, with residual spastic left hemiparesis  2. New onset right hand tremor  -Doing well, overall stable, tremor is well treated with Inderal -Continue Inderal LA 60 mg daily for tremor -No signs of Parkinson's disease noted on exam such as rigidity or bradykinesia -MRI of the brain October 2021 showed chronic ischemic infarction involving right parietal and periinsular region, moderate supratentorial small vessel disease, already on Coumadin and aspirin  -RPR was negative, B12 slightly above range 1411, TSH 1.580 in August 2021 -Return here on an as-needed basis, continue follow-up with PCP  Evangeline Dakin, [facility] [address] [place], [place] [address] [phone]

## 2021-08-24 ENCOUNTER — Encounter: Payer: Self-pay | Admitting: Neurology

## 2021-08-24 ENCOUNTER — Ambulatory Visit: Payer: Medicare HMO | Admitting: Neurology

## 2021-08-24 VITALS — BP 123/75 | HR 72 | Ht 72.0 in | Wt 239.5 lb

## 2021-08-24 DIAGNOSIS — R251 Tremor, unspecified: Secondary | ICD-10-CM

## 2021-08-24 DIAGNOSIS — J449 Chronic obstructive pulmonary disease, unspecified: Secondary | ICD-10-CM | POA: Diagnosis not present

## 2021-08-24 DIAGNOSIS — G8110 Spastic hemiplegia affecting unspecified side: Secondary | ICD-10-CM

## 2021-08-30 DIAGNOSIS — R3915 Urgency of urination: Secondary | ICD-10-CM | POA: Diagnosis not present

## 2021-08-30 DIAGNOSIS — N401 Enlarged prostate with lower urinary tract symptoms: Secondary | ICD-10-CM | POA: Diagnosis not present

## 2021-08-30 DIAGNOSIS — R3912 Poor urinary stream: Secondary | ICD-10-CM | POA: Diagnosis not present

## 2021-08-30 DIAGNOSIS — N5201 Erectile dysfunction due to arterial insufficiency: Secondary | ICD-10-CM | POA: Diagnosis not present

## 2021-09-13 ENCOUNTER — Ambulatory Visit (INDEPENDENT_AMBULATORY_CARE_PROVIDER_SITE_OTHER): Payer: Medicare HMO | Admitting: Pharmacist

## 2021-09-13 DIAGNOSIS — Z86711 Personal history of pulmonary embolism: Secondary | ICD-10-CM

## 2021-09-13 DIAGNOSIS — Z7901 Long term (current) use of anticoagulants: Secondary | ICD-10-CM | POA: Diagnosis not present

## 2021-09-13 DIAGNOSIS — Z86718 Personal history of other venous thrombosis and embolism: Secondary | ICD-10-CM | POA: Diagnosis not present

## 2021-09-13 LAB — POCT INR: INR: 2.6 (ref 2.0–3.0)

## 2021-09-13 NOTE — Progress Notes (Signed)
Evaluation and management procedures were performed by the Clinical Pharmacy Practitioner under my supervision and collaboration. I have reviewed the Practitioner's note and chart, and I agree with the management and plan as documented above. ° °

## 2021-09-13 NOTE — Progress Notes (Signed)
Anticoagulation Management ?Alexander English is a 66 y.o. male who reports to the clinic for monitoring of warfarin treatment.   ? ?Indication: DVT and PE History of; Long term current use of oral anticoagulant, warfarin. INR goal 2.0 - 3.0.  ?Duration: indefinite ?Supervising physician: Gilles Chiquito ? ?Anticoagulation Clinic Visit History: ?Patient does not report signs/symptoms of bleeding or thromboembolism  ?Other recent changes: No diet, medications, lifestyle changes endorsed at this visit.  ?Anticoagulation Episode Summary   ? ? Current INR goal:  2.0-3.0  ?TTR:  81.7 % (9.5 y)  ?Next INR check:  10/11/2021  ?INR from last check:  2.6 (09/13/2021)  ?Weekly max warfarin dose:    ?Target end date:  Indefinite  ?INR check location:  Anticoagulation Clinic  ?Preferred lab:    ?Send INR reminders to:  [facility]  ? Indications   ?History of venous thromboembolism [Z86.718] ? ?  ?  ? ? Comments:    ?  ? ?  ? ? ?No Known Allergies ? ?Current Outpatient Medications:  ?  aspirin EC 81 MG tablet, Take 81 mg by mouth daily., Disp: , Rfl:  ?  atorvastatin (LIPITOR) 10 MG tablet, Take 1 tablet (10 mg total) by mouth daily., Disp: 90 tablet, Rfl: 3 ?  fesoterodine (TOVIAZ) 8 MG TB24 tablet, Take 1 tablet (8 mg total) by mouth daily., Disp: 90 tablet, Rfl: 3 ?  mirabegron ER (MYRBETRIQ) 50 MG TB24 tablet, Take 50 mg by mouth daily., Disp: , Rfl:  ?  Multiple Vitamin (MULTIVITAMIN) tablet, Take 1 tablet by mouth daily., Disp: , Rfl:  ?  propranolol ER (INDERAL LA) 60 MG 24 hr capsule, Take 1 capsule (60 mg total) by mouth daily., Disp: 90 capsule, Rfl: 1 ?  sertraline (ZOLOFT) 50 MG tablet, Take 1 tablet (50 mg total) by mouth daily., Disp: 90 tablet, Rfl: 3 ?  tamsulosin (FLOMAX) 0.4 MG CAPS capsule, Take 0.4 mg by mouth daily., Disp: , Rfl:  ?  traMADol (ULTRAM) 50 MG tablet, Take 50 mg by mouth every 6 (six) hours as needed., Disp: , Rfl:  ?  warfarin (COUMADIN) 5 MG tablet, TAKE ONE (1) TABLET BY MOUTH DAILY--EXCEPT  ON WEDNESDAYS AND SATURDAYS, TAKE ONLY ONE-HALF (1/2) TABLET ON WEDNESDAYS AND SATURDAYS., Disp: 100 tablet, Rfl: 0 ?  zolpidem (AMBIEN) 5 MG tablet, Take 5 mg by mouth at bedtime as needed for sleep., Disp: , Rfl:  ?Past Medical History:  ?Diagnosis Date  ? Aortic atherosclerosis ([place]) 10/23/2017  ? Asymptomatic, seen on CT scan  ? Bilateral cataracts 05/18/2016  ? Not visually significant  ? Cancer [facility])   ? maxillary  ? Cerebrovascular accident (CVA) ([place]) 03/27/2020  ? Chronic constipation 06/12/2013  ? Chronic low back pain 06/12/2013  ? L4-5 facet arthropathy   ? Chronic pain of both shoulders 12/07/2013  ? A/C joint osteoarthritis   ? Cluster headaches   ? Resolved in 2006  ? COPD (chronic obstructive pulmonary disease) ([place])   ? Dry eye syndrome, bilateral 05/18/2016  ? Embolic stroke involving right middle cerebral artery ([place]) 08/08/2004  ? Occured after traveling from [place] to [place] where he was hospitalized for 1 month with no further work-up or therapy.  Returned to [place] unable to walk and was admitted to [facility] immediately after landing. Presumed embolic per neuro but TEE, bubble study, and hypercoag W/U negative. On indefinite coumadin per patient's informed preference.  Residual effects : Left spastic hemiplegia. Tx with phenol tibi  ? Generalized anxiety disorder  08/03/2018  ? History of kidney stones   ? Hyperplastic colon polyp 07/27/2012  ? Excised endoscopically 07/27/2012  ? Hypertensive retinopathy of both eyes 05/18/2016  ? Internal and external hemorrhoids without complication 87/86/7672  ? Seen on colonoscopy 04/03/2007.  ? Left nephrolithiasis 08/31/2015  ? With mild left hydronephrosis.  Scheduled to undergo shockwave lithotripsy.  ? Obesity (BMI 30.0-34.9) 12/07/2013  ? Osteoarthritis of both knees 01/26/2012  ? Positive PPD 12/07/2013  ? Pulmonary embolism ([place]) 09/30/2004  ? Diagnosed in April 2006 after returning from [place].  Likely provoked as it occurred after a 12 hour plane flight  within 2 months of R MCA CVA with left hemiparesis. Patient has made an informed decision to remain on lifelong warfarin.   ? Tubular adenoma 12/19/2017  ? Endoscopically excised 04/03/2007.  ? Urge incontinence of urine 08/31/2015  ? Possibly secondary to prior CVA.  Treated with Myrbetriq.  ? ?Social History  ? ?Socioeconomic History  ? Marital status: Single  ?  Spouse name: Not on file  ? Number of children: 3  ? Years of education: 54  ? Highest education level: Not on file  ?Occupational History  ? Occupation: Retired  ?Tobacco Use  ? Smoking status: Former  ?  Types: Cigarettes  ?  Quit date: 06/28/2007  ?  Years since quitting: 14.2  ? Smokeless tobacco: Never  ?Vaping Use  ? Vaping Use: Never used  ?Substance and Sexual Activity  ? Alcohol use: Not Currently  ?  Comment: Rarely.  ? Drug use: No  ? Sexual activity: Not on file  ?Other Topics Concern  ? Not on file  ?Social History Narrative  ? Born in [place] but has been in the [place] for > 30 years.  Divorced, 1 daughter and 2 sons.  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  ? Right handed  ? Coffee/tea sometimes, soda rarely  ? ?Social Determinants of Health  ? ?Financial Resource Strain: Not on file  ?Food Insecurity: Not on file  ?Transportation Needs: Not on file  ?Physical Activity: Not on file  ?Stress: Not on file  ?Social Connections: Not on file  ? ?Family History  ?Problem Relation Age of Onset  ? Stroke Maternal Grandmother   ? Unexplained death Mother   ? Depression Mother   ?     After husband's death  ? Unexplained death Father   ? Osteoarthritis Father   ?     Knees  ? Early death Sister   ? Seizures Sister   ? Early death Brother 19  ?     Unknown cause  ? Healthy Daughter   ? Healthy Son   ? Stroke Maternal Aunt   ? Healthy Sister   ? Healthy Sister   ? Unexplained death Brother   ? Healthy Brother   ? Healthy Son   ? ? ?ASSESSMENT ?Recent Results: ?The most recent result is correlated with 30 mg per  week: ?Lab Results  ?Component Value Date  ? INR 2.6 09/13/2021  ? INR 2.0 08/16/2021  ? INR 2.1 07/19/2021  ? ? ?Anticoagulation Dosing: ?Description   ?Take one of your '5mg'$  peach-colored warfarin tablets by mouth, once-daily--EXCEPT on Wednesdays and Saturdays, take ONLY 1/2 tablet.  ? ? ?  ?  ?INR today: Therapeutic ? ?PLAN ?Weekly dose was unchanged. Continue to take '5mg'$  warfarin all days of the week--EXCEPT on Wednesdays and Fridays, take only 1/2  of your '5mg'$   peach-colored warfarin tablet on these days for a total of '30mg'$  warfarin per week.  ? ?Patient Instructions  ?Patient instructed to take medications as defined in the Anti-coagulation Track section of this encounter.  ?Patient instructed to take today's dose.  ?Patient instructed to take one of your '5mg'$  peach-colored warfarin tablets by mouth, once-daily--EXCEPT on Wednesdays and Saturdays, take ONLY 1/2 tablet.  ?Patient verbalized understanding of these instructions.   ?Patient advised to contact clinic or seek medical attention if signs/symptoms of bleeding or thromboembolism occur. ? ?Patient verbalized understanding by repeating back information and was advised to contact me if further medication-related questions arise. Patient was also provided an information handout. ? ?Follow-up ?Return in 4 weeks (on 10/11/2021) for Follow up INR. ? ?[name], PharmD, CPP ? ?15 minutes spent face-to-face with the patient during the encounter. 50% of time spent on education, including signs/sx bleeding and clotting, as well as food and drug interactions with warfarin. 50% of time was spent on fingerprick POC INR sample collection,processing, results determination, and documentation in http://www.kim.net/.  ?

## 2021-09-13 NOTE — Patient Instructions (Signed)
Patient instructed to take medications as defined in the Anti-coagulation Track section of this encounter.  ?Patient instructed to take today's dose.  ?Patient instructed to take one of your '5mg'$  peach-colored warfarin tablets by mouth, once-daily--EXCEPT on Wednesdays and Saturdays, take ONLY 1/2 tablet.  ?Patient verbalized understanding of these instructions.   ?

## 2021-10-11 ENCOUNTER — Ambulatory Visit: Payer: Medicare HMO

## 2021-10-14 ENCOUNTER — Ambulatory Visit (INDEPENDENT_AMBULATORY_CARE_PROVIDER_SITE_OTHER): Payer: Medicare HMO | Admitting: Pharmacist

## 2021-10-14 ENCOUNTER — Other Ambulatory Visit: Payer: Self-pay

## 2021-10-14 ENCOUNTER — Encounter: Payer: Self-pay | Admitting: Internal Medicine

## 2021-10-14 ENCOUNTER — Ambulatory Visit (INDEPENDENT_AMBULATORY_CARE_PROVIDER_SITE_OTHER): Payer: Medicare HMO | Admitting: Internal Medicine

## 2021-10-14 VITALS — BP 111/67 | HR 68 | Temp 98.1°F | Ht 72.0 in | Wt 238.7 lb

## 2021-10-14 DIAGNOSIS — M5442 Lumbago with sciatica, left side: Secondary | ICD-10-CM | POA: Diagnosis not present

## 2021-10-14 DIAGNOSIS — G8110 Spastic hemiplegia affecting unspecified side: Secondary | ICD-10-CM

## 2021-10-14 DIAGNOSIS — E7800 Pure hypercholesterolemia, unspecified: Secondary | ICD-10-CM

## 2021-10-14 DIAGNOSIS — R7303 Prediabetes: Secondary | ICD-10-CM

## 2021-10-14 DIAGNOSIS — G8929 Other chronic pain: Secondary | ICD-10-CM | POA: Diagnosis not present

## 2021-10-14 DIAGNOSIS — F411 Generalized anxiety disorder: Secondary | ICD-10-CM | POA: Diagnosis not present

## 2021-10-14 DIAGNOSIS — Z86718 Personal history of other venous thrombosis and embolism: Secondary | ICD-10-CM

## 2021-10-14 DIAGNOSIS — I700 Atherosclerosis of aorta: Secondary | ICD-10-CM

## 2021-10-14 LAB — POCT INR: INR: 2.9 (ref 2.0–3.0)

## 2021-10-14 NOTE — Assessment & Plan Note (Signed)
Recheck a1c today ?

## 2021-10-14 NOTE — Patient Instructions (Signed)
I want you to keep following up with Dr Mayer Camel about your back and hip pain. ?

## 2021-10-14 NOTE — Progress Notes (Signed)
?  Subjective:  ?HPI: ?[name] is a 66 y.o. male who presents for f/u prediabetes, left hemiplegia. ? ?Please see Assessment and Plan below for the status of his chronic medical problems. ? ?Objective:  ?Physical Exam: ?Vitals:  ? 10/14/21 1041  ?BP: 111/67  ?Pulse: 68  ?Temp: 98.1 ?F (36.7 ?C)  ?TempSrc: Oral  ?SpO2: 97%  ?Weight: 238 lb 11.2 oz (108.3 kg)  ?Height: 6' (1.829 m)  ? ?Body mass index is 32.37 kg/m?Marland Kitchen ?Physical Exam ?Vitals and nursing note reviewed.  ?Constitutional:   ?   Appearance: Normal appearance.  ?Cardiovascular:  ?   Rate and Rhythm: Normal rate and regular rhythm.  ?Pulmonary:  ?   Effort: Pulmonary effort is normal.  ?Neurological:  ?   Mental Status: He is alert.  ?Psychiatric:     ?   Mood and Affect: Mood normal.  ? ?Assessment & Plan:  ?See Encounters Tab for problem based charting. ? ?Medications Ordered ?No orders of the defined types were placed in this encounter. ? ?Other Orders ?Orders Placed This Encounter  ?Procedures  ? Hemoglobin A1c  ? BMP8+Anion Gap  ? Lipid Profile  ? ?Follow Up: ?Return in about 6 months (around 04/15/2022). ? ?

## 2021-10-14 NOTE — Assessment & Plan Note (Signed)
He reports Dr Mayer Camel had recommended a medicine but he was concerned about a possible interaction with sertraline.  He now has not been taking sertraline for some time. (3 months) and willing to give the medication a try.  He will call back to the office later and let me know what medication it is.   ?

## 2021-10-14 NOTE — Progress Notes (Signed)
Anticoagulation Management ?Alexander English is a 66 y.o. male who reports to the clinic for monitoring of warfarin treatment.   ? ?Indication: DVT , History of; Long term current use of oral anticoagulant--warfarin, target INR 2.0 - 3.0.  ?Duration: indefinite ?Supervising physician: Joni Reining ? ?Anticoagulation Clinic Visit History: ?Patient does not report signs/symptoms of bleeding or thromboembolism  ?Other recent changes: No diet, medications, lifestyle changes endorsed by the patient at this visit with me. ?Anticoagulation Episode Summary   ? ? Current INR goal:  2.0-3.0  ?TTR:  81.9 % (9.6 y)  ?Next INR check:  11/08/2021  ?INR from last check:  2.9 (10/14/2021)  ?Weekly max warfarin dose:    ?Target end date:  Indefinite  ?INR check location:  Anticoagulation Clinic  ?Preferred lab:    ?Send INR reminders to:  [facility]  ? Indications   ?History of venous thromboembolism [Z86.718] ? ?  ?  ? ? Comments:    ?  ? ?  ? ? ?No Known Allergies ? ?Current Outpatient Medications:  ?  aspirin EC 81 MG tablet, Take 81 mg by mouth daily., Disp: , Rfl:  ?  atorvastatin (LIPITOR) 10 MG tablet, Take 1 tablet (10 mg total) by mouth daily., Disp: 90 tablet, Rfl: 3 ?  fesoterodine (TOVIAZ) 8 MG TB24 tablet, Take 1 tablet (8 mg total) by mouth daily., Disp: 90 tablet, Rfl: 3 ?  mirabegron ER (MYRBETRIQ) 50 MG TB24 tablet, Take 50 mg by mouth daily., Disp: , Rfl:  ?  Multiple Vitamin (MULTIVITAMIN) tablet, Take 1 tablet by mouth daily., Disp: , Rfl:  ?  propranolol ER (INDERAL LA) 60 MG 24 hr capsule, Take 1 capsule (60 mg total) by mouth daily., Disp: 90 capsule, Rfl: 1 ?  sertraline (ZOLOFT) 50 MG tablet, Take 1 tablet (50 mg total) by mouth daily., Disp: 90 tablet, Rfl: 3 ?  tamsulosin (FLOMAX) 0.4 MG CAPS capsule, Take 0.4 mg by mouth daily., Disp: , Rfl:  ?  traMADol (ULTRAM) 50 MG tablet, Take 50 mg by mouth every 6 (six) hours as needed., Disp: , Rfl:  ?  warfarin (COUMADIN) 5 MG tablet, TAKE ONE (1) TABLET BY  MOUTH DAILY--EXCEPT ON WEDNESDAYS AND SATURDAYS, TAKE ONLY ONE-HALF (1/2) TABLET ON WEDNESDAYS AND SATURDAYS., Disp: 100 tablet, Rfl: 0 ?  zolpidem (AMBIEN) 5 MG tablet, Take 5 mg by mouth at bedtime as needed for sleep., Disp: , Rfl:  ?Past Medical History:  ?Diagnosis Date  ? Aortic atherosclerosis ([place]) 10/23/2017  ? Asymptomatic, seen on CT scan  ? Bilateral cataracts 05/18/2016  ? Not visually significant  ? Cancer [facility])   ? maxillary  ? Cerebrovascular accident (CVA) ([place]) 03/27/2020  ? Chronic constipation 06/12/2013  ? Chronic low back pain 06/12/2013  ? L4-5 facet arthropathy   ? Chronic pain of both shoulders 12/07/2013  ? A/C joint osteoarthritis   ? Cluster headaches   ? Resolved in 2006  ? COPD (chronic obstructive pulmonary disease) ([place])   ? Dry eye syndrome, bilateral 05/18/2016  ? Embolic stroke involving right middle cerebral artery ([place]) 08/08/2004  ? Occured after traveling from [place] to [place] where he was hospitalized for 1 month with no further work-up or therapy.  Returned to [place] unable to walk and was admitted to [facility] immediately after landing. Presumed embolic per neuro but TEE, bubble study, and hypercoag W/U negative. On indefinite coumadin per patient's informed preference.  Residual effects : Left spastic hemiplegia. Tx with phenol tibi  ? Generalized  anxiety disorder 08/03/2018  ? History of kidney stones   ? Hyperplastic colon polyp 07/27/2012  ? Excised endoscopically 07/27/2012  ? Hypertensive retinopathy of both eyes 05/18/2016  ? Internal and external hemorrhoids without complication 46/50/3546  ? Seen on colonoscopy 04/03/2007.  ? Left nephrolithiasis 08/31/2015  ? With mild left hydronephrosis.  Scheduled to undergo shockwave lithotripsy.  ? Obesity (BMI 30.0-34.9) 12/07/2013  ? Osteoarthritis of both knees 01/26/2012  ? Positive PPD 12/07/2013  ? Pulmonary embolism ([place]) 09/30/2004  ? Diagnosed in April 2006 after returning from [place].  Likely provoked as it occurred after a 12  hour plane flight within 2 months of R MCA CVA with left hemiparesis. Patient has made an informed decision to remain on lifelong warfarin.   ? Tubular adenoma 12/19/2017  ? Endoscopically excised 04/03/2007.  ? Urge incontinence of urine 08/31/2015  ? Possibly secondary to prior CVA.  Treated with Myrbetriq.  ? ?Social History  ? ?Socioeconomic History  ? Marital status: Single  ?  Spouse name: Not on file  ? Number of children: 3  ? Years of education: 35  ? Highest education level: Not on file  ?Occupational History  ? Occupation: Retired  ?Tobacco Use  ? Smoking status: Former  ?  Types: Cigarettes  ?  Quit date: 06/28/2007  ?  Years since quitting: 14.3  ? Smokeless tobacco: Never  ?Vaping Use  ? Vaping Use: Never used  ?Substance and Sexual Activity  ? Alcohol use: Not Currently  ?  Comment: Rarely.  ? Drug use: No  ? Sexual activity: Not on file  ?Other Topics Concern  ? Not on file  ?Social History Narrative  ? Born in [place] but has been in the [place] for > 30 years.  Divorced, 1 daughter and 2 sons.  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  ? Right handed  ? Coffee/tea sometimes, soda rarely  ? ?Social Determinants of Health  ? ?Financial Resource Strain: Not on file  ?Food Insecurity: Not on file  ?Transportation Needs: Not on file  ?Physical Activity: Not on file  ?Stress: Not on file  ?Social Connections: Not on file  ? ?Family History  ?Problem Relation Age of Onset  ? Stroke Maternal Grandmother   ? Unexplained death Mother   ? Depression Mother   ?     After husband's death  ? Unexplained death Father   ? Osteoarthritis Father   ?     Knees  ? Early death Sister   ? Seizures Sister   ? Early death Brother 60  ?     Unknown cause  ? Healthy Daughter   ? Healthy Son   ? Stroke Maternal Aunt   ? Healthy Sister   ? Healthy Sister   ? Unexplained death Brother   ? Healthy Brother   ? Healthy Son   ? ? ?ASSESSMENT ?Recent Results: ?The most recent result is  correlated with 30 mg per week: ?Lab Results  ?Component Value Date  ? INR 2.9 10/14/2021  ? INR 2.6 09/13/2021  ? INR 2.0 08/16/2021  ? ? ?Anticoagulation Dosing: ?Description   ?Take one of your '5mg'$  peach-colored warfarin tablets by mouth, once-daily--EXCEPT on Mondays, Wednesdays and Fridays, take ONLY 1/2 tablet.  ? ? ?  ?  ?INR today: Therapeutic ? ?PLAN ?Weekly dose was decreased by 8% to 27.5 mg per week ? ?Patient Instructions  ?Patient instructed to take medications as defined  in the Anti-coagulation Track section of this encounter.  ?Patient instructed to take today's dose.  ?Patient instructed to take one of your '5mg'$  peach-colored warfarin tablets by mouth, once-daily--EXCEPT on Mondays, Wednesdays and Fridays, take ONLY 1/2 tablet.  ?Patient verbalized understanding of these instructions.   ?Patient advised to contact clinic or seek medical attention if signs/symptoms of bleeding or thromboembolism occur. ? ?Patient verbalized understanding by repeating back information and was advised to contact me if further medication-related questions arise. Patient was also provided an information handout. ? ?Follow-up ?Return in 25 days (on 11/08/2021) for Follow up INR. ? ?[name], PharmD, CPP ? ?15 minutes spent face-to-face with the patient during the encounter. 50% of time spent on education, including signs/sx bleeding and clotting, as well as food and drug interactions with warfarin. 50% of time was spent on fingerprick POC INR sample collection,processing, results determination, and documentation in http://www.kim.net/.  ?

## 2021-10-14 NOTE — Assessment & Plan Note (Signed)
Chronic and stable, contributing to chronic left hip and low back pain, currently following with Dr Mayer Camel.  Advised to continue to follow up, may consider a course of physical therapy. ?

## 2021-10-14 NOTE — Assessment & Plan Note (Signed)
Chronic and stable, remains on atorvastatin, will recheck lipid panel. ?

## 2021-10-14 NOTE — Assessment & Plan Note (Signed)
Stopped sertraline 3 months ago, feels anxiety is reasonably well controlled without it now.   ?

## 2021-10-14 NOTE — Patient Instructions (Signed)
Patient instructed to take medications as defined in the Anti-coagulation Track section of this encounter.  Patient instructed to take today's dose.  Patient instructed to take one of your 5mg peach-colored warfarin tablets by mouth, once-daily--EXCEPT on Mondays, Wednesdays and Fridays, take ONLY 1/2 tablet.  Patient verbalized understanding of these instructions.   

## 2021-10-15 LAB — LIPID PANEL
Chol/HDL Ratio: 2.3 ratio (ref 0.0–5.0)
Cholesterol, Total: 99 mg/dL — ABNORMAL LOW (ref 100–199)
HDL: 44 mg/dL (ref >39)
LDL Chol Calc (NIH): 43 mg/dL (ref 0–99)
Triglycerides: 50 mg/dL (ref 0–149)
VLDL Cholesterol Cal: 12 mg/dL (ref 5–40)

## 2021-10-15 LAB — BMP8+ANION GAP
Anion Gap: 16 mmol/L (ref 10.0–18.0)
BUN/Creatinine Ratio: 14 (ref 10–24)
BUN: 12 mg/dL (ref 8–27)
CO2: 19 mmol/L — ABNORMAL LOW (ref 20–29)
Calcium: 9.6 mg/dL (ref 8.6–10.2)
Chloride: 106 mmol/L (ref 96–106)
Creatinine, Ser: 0.86 mg/dL (ref 0.76–1.27)
Glucose: 93 mg/dL (ref 70–99)
Potassium: 4.1 mmol/L (ref 3.5–5.2)
Sodium: 141 mmol/L (ref 134–144)
eGFR: 95 mL/min/{1.73_m2} (ref >59)

## 2021-10-15 LAB — HEMOGLOBIN A1C
Est. average glucose Bld gHb Est-mCnc: 123 mg/dL
Hgb A1c MFr Bld: 5.9 % — ABNORMAL HIGH (ref 4.8–5.6)

## 2021-10-15 NOTE — Progress Notes (Signed)
Evaluation and management procedures were performed by the Clinical Pharmacy Practitioner under my supervision and collaboration. I have reviewed the Practitioner's note and chart, and I agree with the management and plan as documented above. ° °

## 2021-10-26 ENCOUNTER — Other Ambulatory Visit: Payer: Self-pay | Admitting: Pharmacist

## 2021-10-26 DIAGNOSIS — Z86711 Personal history of pulmonary embolism: Secondary | ICD-10-CM

## 2021-10-29 ENCOUNTER — Other Ambulatory Visit: Payer: Self-pay | Admitting: *Deleted

## 2021-10-29 DIAGNOSIS — G252 Other specified forms of tremor: Secondary | ICD-10-CM

## 2021-11-02 MED ORDER — PROPRANOLOL HCL ER 60 MG PO CP24: 60.0000 mg | ORAL_CAPSULE | Freq: Every day | ORAL | 1 refills | Status: DC

## 2021-11-08 ENCOUNTER — Ambulatory Visit: Payer: Medicare HMO

## 2021-11-15 ENCOUNTER — Ambulatory Visit (INDEPENDENT_AMBULATORY_CARE_PROVIDER_SITE_OTHER): Payer: Medicare HMO | Admitting: Pharmacist

## 2021-11-15 ENCOUNTER — Other Ambulatory Visit: Payer: Self-pay | Admitting: *Deleted

## 2021-11-15 DIAGNOSIS — Z86711 Personal history of pulmonary embolism: Secondary | ICD-10-CM | POA: Diagnosis not present

## 2021-11-15 DIAGNOSIS — Z7901 Long term (current) use of anticoagulants: Secondary | ICD-10-CM

## 2021-11-15 DIAGNOSIS — Z86718 Personal history of other venous thrombosis and embolism: Secondary | ICD-10-CM | POA: Diagnosis not present

## 2021-11-15 LAB — POCT INR: INR: 2.2 (ref 2.0–3.0)

## 2021-11-15 MED ORDER — ZOLPIDEM TARTRATE 5 MG PO TABS: 5.0000 mg | ORAL_TABLET | Freq: Every evening | ORAL | 2 refills | Status: DC | PRN

## 2021-11-15 NOTE — Telephone Encounter (Signed)
Patient in clinic for coumadin check. Requesting 90 day refill on Ambien at [facility] on [address].

## 2021-11-15 NOTE — Patient Instructions (Signed)
Patient instructed to take medications as defined in the Anti-coagulation Track section of this encounter.  Patient instructed to take today's dose.  Patient instructed to take one of your 5mg peach-colored warfarin tablets by mouth, once-daily--EXCEPT on Wednesdays and Fridays, take ONLY 1/2 tablet.  Patient verbalized understanding of these instructions.   

## 2021-11-15 NOTE — Progress Notes (Signed)
Anticoagulation Management Alexander English is a 65 y.o. male who reports to the clinic for monitoring of warfarin treatment.    Indication: PE , DVT--History of; Long term current use of oral anticoagulant, warfarin--target INR 2.0 - 3.0.  Duration: indefinite Supervising physician: Joni Reining  Anticoagulation Clinic Visit History: Patient does not report signs/symptoms of bleeding or thromboembolism  Other recent changes: No diet, medications, lifestyle changes. Anticoagulation Episode Summary     Current INR goal:  2.0-3.0  TTR:  82.1 % (9.6 y)  Next INR check:  12/13/2021  INR from last check:  2.2 (11/15/2021)  Weekly max warfarin dose:    Target end date:  Indefinite  INR check location:  Anticoagulation Clinic  Preferred lab:    Send INR reminders to:  [facility]   Indications   History of venous thromboembolism [Z86.718]        Comments:           No Known Allergies  Current Outpatient Medications:    aspirin EC 81 MG tablet, Take 81 mg by mouth daily., Disp: , Rfl:    atorvastatin (LIPITOR) 10 MG tablet, Take 1 tablet (10 mg total) by mouth daily., Disp: 90 tablet, Rfl: 3   fesoterodine (TOVIAZ) 8 MG TB24 tablet, Take 1 tablet (8 mg total) by mouth daily., Disp: 90 tablet, Rfl: 3   mirabegron ER (MYRBETRIQ) 50 MG TB24 tablet, Take 50 mg by mouth daily., Disp: , Rfl:    Multiple Vitamin (MULTIVITAMIN) tablet, Take 1 tablet by mouth daily., Disp: , Rfl:    propranolol ER (INDERAL LA) 60 MG 24 hr capsule, Take 1 capsule (60 mg total) by mouth daily., Disp: 90 capsule, Rfl: 1   warfarin (COUMADIN) 5 MG tablet, TAKE 1 TABLET BY MOUTH ONCE DAILY AT  6  IN  THE  EVENING  EXCEPT  ON  WEDNESDAY  AND  FRIDAY,  TAKE  ONLY  1/2  TABLET, Disp: 100 tablet, Rfl: 0   zolpidem (AMBIEN) 5 MG tablet, Take 5 mg by mouth at bedtime as needed for sleep., Disp: , Rfl:    tamsulosin (FLOMAX) 0.4 MG CAPS capsule, Take 0.4 mg by mouth daily. (Patient not taking: Reported on  11/15/2021), Disp: , Rfl:    traMADol (ULTRAM) 50 MG tablet, Take 50 mg by mouth every 6 (six) hours as needed. (Patient not taking: Reported on 11/15/2021), Disp: , Rfl:  Past Medical History:  Diagnosis Date   Aortic atherosclerosis ([place]) 10/23/2017   Asymptomatic, seen on CT scan   Bilateral cataracts 05/18/2016   Not visually significant   Cancer ([place])    maxillary   Cerebrovascular accident (CVA) ([place]) 03/27/2020   Chronic constipation 06/12/2013   Chronic low back pain 06/12/2013   L4-5 facet arthropathy    Chronic pain of both shoulders 12/07/2013   A/C joint osteoarthritis    Cluster headaches    Resolved in 2006   COPD (chronic obstructive pulmonary disease) ([place])    Dry eye syndrome, bilateral 96/22/2979   Embolic stroke involving right middle cerebral artery ([place]) 08/08/2004   Occured after traveling from [place] to [place] where he was hospitalized for 1 month with no further work-up or therapy.  Returned to [place] unable to walk and was admitted to [facility] immediately after landing. Presumed embolic per neuro but TEE, bubble study, and hypercoag W/U negative. On indefinite coumadin per patient's informed preference.  Residual effects : Left spastic hemiplegia. Tx with phenol tibi   Generalized anxiety disorder 08/03/2018  History of kidney stones    Hyperplastic colon polyp 07/27/2012   Excised endoscopically 07/27/2012   Hypertensive retinopathy of both eyes 05/18/2016   Internal and external hemorrhoids without complication 68/34/1962   Seen on colonoscopy 04/03/2007.   Left nephrolithiasis 08/31/2015   With mild left hydronephrosis.  Scheduled to undergo shockwave lithotripsy.   Obesity (BMI 30.0-34.9) 12/07/2013   Osteoarthritis of both knees 01/26/2012   Positive PPD 12/07/2013   Pulmonary embolism ([place]) 09/30/2004   Diagnosed in April 2006 after returning from [place].  Likely provoked as it occurred after a 12 hour plane flight within 2 months of R MCA CVA with left  hemiparesis. Patient has made an informed decision to remain on lifelong warfarin.    Tubular adenoma 12/19/2017   Endoscopically excised 04/03/2007.   Urge incontinence of urine 08/31/2015   Possibly secondary to prior CVA.  Treated with Myrbetriq.   Social History   Socioeconomic History   Marital status: Single    Spouse name: Not on file   Number of children: 3   Years of education: 16   Highest education level: Not on file  Occupational History   Occupation: Retired  Tobacco Use   Smoking status: Former    Types: Cigarettes    Quit date: 06/28/2007    Years since quitting: 14.3   Smokeless tobacco: Never  Vaping Use   Vaping Use: Never used  Substance and Sexual Activity   Alcohol use: Yes    Comment: Rarely.   Drug use: No   Sexual activity: Not on file  Other Topics Concern   Not on file  Social History Narrative   Born in [place] but has been in the [place] for > 30 years.  Divorced, 1 daughter and 2 sons.  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   Right handed   Coffee/tea sometimes, soda rarely   Social Determinants of Health   Financial Resource Strain: Not on file  Food Insecurity: Not on file  Transportation Needs: Not on file  Physical Activity: Not on file  Stress: Not on file  Social Connections: Not on file   Family History  Problem Relation Age of Onset   Stroke Maternal Grandmother    Unexplained death Mother    Depression Mother        After husband's death   Unexplained death Father    Osteoarthritis Father        Knees   Early death Sister    Seizures Sister    Early death Brother 24       Unknown cause   Healthy Daughter    Healthy Son    Stroke Maternal Aunt    Healthy Sister    Healthy Sister    Unexplained death Brother    Healthy Brother    Healthy Son     ASSESSMENT Recent Results: The most recent result is correlated with 27.5 mg per week: Lab Results  Component Value Date    INR 2.2 11/15/2021   INR 2.9 10/14/2021   INR 2.6 09/13/2021    Anticoagulation Dosing: Description   Take one of your '5mg'$  peach-colored warfarin tablets by mouth, once-daily--EXCEPT on Wednesdays and Fridays, take ONLY 1/2 tablet.        INR today: Therapeutic  PLAN Weekly dose was increased by 9% to 30 mg per week  Patient Instructions  Patient instructed to take medications as defined in the Anti-coagulation Track section of this  encounter.  Patient instructed to take today's dose.  Patient instructed to take one of your '5mg'$  peach-colored warfarin tablets by mouth, once-daily--EXCEPT on Wednesdays and Fridays, take ONLY 1/2 tablet.  Patient verbalized understanding of these instructions.   Patient advised to contact clinic or seek medical attention if signs/symptoms of bleeding or thromboembolism occur.  Patient verbalized understanding by repeating back information and was advised to contact me if further medication-related questions arise. Patient was also provided an information handout.  Follow-up Return in 4 weeks (on 12/13/2021) for Follow up INR.  [name], PharmD, CPP  15 minutes spent face-to-face with the patient during the encounter. 50% of time spent on education, including signs/sx bleeding and clotting, as well as food and drug interactions with warfarin. 50% of time was spent on fingerprick POC INR sample collection,processing, results determination, and documentation in http://www.kim.net/.

## 2021-11-16 NOTE — Progress Notes (Signed)
Evaluation and management procedures were performed by the Clinical Pharmacy Practitioner under my supervision and collaboration. I have reviewed the Practitioner's note and chart, and I agree with the management and plan as documented above. ° °

## 2021-12-01 ENCOUNTER — Ambulatory Visit: Payer: Medicare HMO | Admitting: Podiatry

## 2021-12-01 DIAGNOSIS — L603 Nail dystrophy: Secondary | ICD-10-CM | POA: Diagnosis not present

## 2021-12-07 NOTE — Progress Notes (Signed)
Subjective:  Patient ID: Alexander English, male    DOB: 24-Sep-1955,  MRN: [id number]  Chief Complaint  Patient presents with   Nail Problem    Left hallux nail     66 y.o. male presents with the above complaint.  Patient presents with left great toe nail dystrophy.  Patient states is sometimes painful to touch is not bothering him too much today.  Patient had an ingrown done by me in that toe multiple times.  He states that it is hurting and causing him some pain even though not today.  He wanted to discuss options in regard to the knee.  He would also like to consider taking the nail off.   Review of Systems: Negative except as noted in the HPI. Denies N/V/F/Ch.  Past Medical History:  Diagnosis Date   Aortic atherosclerosis ([place]) 10/23/2017   Asymptomatic, seen on CT scan   Bilateral cataracts 05/18/2016   Not visually significant   Cancer ([place])    maxillary   Cerebrovascular accident (CVA) ([place]) 03/27/2020   Chronic constipation 06/12/2013   Chronic low back pain 06/12/2013   L4-5 facet arthropathy    Chronic pain of both shoulders 12/07/2013   A/C joint osteoarthritis    Cluster headaches    Resolved in 2006   COPD (chronic obstructive pulmonary disease) ([place])    Dry eye syndrome, bilateral 44/06/270   Embolic stroke involving right middle cerebral artery ([place]) 08/08/2004   Occured after traveling from [place] to [place] where he was hospitalized for 1 month with no further work-up or therapy.  Returned to [place] unable to walk and was admitted to [facility] immediately after landing. Presumed embolic per neuro but TEE, bubble study, and hypercoag W/U negative. On indefinite coumadin per patient's informed preference.  Residual effects : Left spastic hemiplegia. Tx with phenol tibi   Generalized anxiety disorder 08/03/2018   History of kidney stones    Hyperplastic colon polyp 07/27/2012   Excised endoscopically 07/27/2012   Hypertensive retinopathy of both eyes 05/18/2016   Internal  and external hemorrhoids without complication 53/66/4403   Seen on colonoscopy 04/03/2007.   Left nephrolithiasis 08/31/2015   With mild left hydronephrosis.  Scheduled to undergo shockwave lithotripsy.   Obesity (BMI 30.0-34.9) 12/07/2013   Osteoarthritis of both knees 01/26/2012   Positive PPD 12/07/2013   Pulmonary embolism ([place]) 09/30/2004   Diagnosed in April 2006 after returning from [place].  Likely provoked as it occurred after a 12 hour plane flight within 2 months of R MCA CVA with left hemiparesis. Patient has made an informed decision to remain on lifelong warfarin.    Tubular adenoma 12/19/2017   Endoscopically excised 04/03/2007.   Urge incontinence of urine 08/31/2015   Possibly secondary to prior CVA.  Treated with Myrbetriq.    Current Outpatient Medications:    aspirin EC 81 MG tablet, Take 81 mg by mouth daily., Disp: , Rfl:    atorvastatin (LIPITOR) 10 MG tablet, Take 1 tablet (10 mg total) by mouth daily., Disp: 90 tablet, Rfl: 3   fesoterodine (TOVIAZ) 8 MG TB24 tablet, Take 1 tablet (8 mg total) by mouth daily., Disp: 90 tablet, Rfl: 3   mirabegron ER (MYRBETRIQ) 50 MG TB24 tablet, Take 50 mg by mouth daily., Disp: , Rfl:    Multiple Vitamin (MULTIVITAMIN) tablet, Take 1 tablet by mouth daily., Disp: , Rfl:    propranolol ER (INDERAL LA) 60 MG 24 hr capsule, Take 1 capsule (60 mg total) by mouth daily., Disp: 90  capsule, Rfl: 1   tamsulosin (FLOMAX) 0.4 MG CAPS capsule, Take 0.4 mg by mouth daily. (Patient not taking: Reported on 11/15/2021), Disp: , Rfl:    traMADol (ULTRAM) 50 MG tablet, Take 50 mg by mouth every 6 (six) hours as needed. (Patient not taking: Reported on 11/15/2021), Disp: , Rfl:    warfarin (COUMADIN) 5 MG tablet, TAKE 1 TABLET BY MOUTH ONCE DAILY AT  6  IN  THE  EVENING  EXCEPT  ON  WEDNESDAY  AND  FRIDAY,  TAKE  ONLY  1/2  TABLET, Disp: 100 tablet, Rfl: 0   zolpidem (AMBIEN) 5 MG tablet, Take 1 tablet (5 mg total) by mouth at bedtime as needed for  sleep., Disp: 30 tablet, Rfl: 2  Social History   Tobacco Use  Smoking Status Former   Types: Cigarettes   Quit date: 06/28/2007   Years since quitting: 14.4  Smokeless Tobacco Never    No Known Allergies Objective:  There were no vitals filed for this visit. There is no height or weight on file to calculate BMI. Constitutional Well developed. Well nourished.  Vascular Dorsalis pedis pulses palpable bilaterally. Posterior tibial pulses palpable bilaterally. Capillary refill normal to all digits.  No cyanosis or clubbing noted. Pedal hair growth normal.  Neurologic Normal speech. Oriented to person, place, and time. Epicritic sensation to light touch grossly present bilaterally.  Dermatologic Nails thickened elongated dystrophic left hallux nail.  Mild pain on palpation Skin within normal limits  Orthopedic: Normal joint ROM without pain or crepitus bilaterally. No visible deformities. No bony tenderness.   Radiographs: None Assessment:   1. Nail dystrophy    Plan:  Patient was evaluated and treated and all questions answered.  Left hallux nail dystrophy -All questions and concerns were discussed with the patient extensive detail.  I discussed that he would benefit from total nail removal with a possible phenol matricectomy.  I discussed the procedure briefly.  He would like to think about it and get back to me when he is ready.  No follow-ups on file.

## 2021-12-13 ENCOUNTER — Ambulatory Visit: Payer: Medicare HMO

## 2021-12-20 ENCOUNTER — Ambulatory Visit: Payer: Medicare HMO

## 2021-12-21 DIAGNOSIS — H40023 Open angle with borderline findings, high risk, bilateral: Secondary | ICD-10-CM | POA: Diagnosis not present

## 2021-12-27 ENCOUNTER — Ambulatory Visit (INDEPENDENT_AMBULATORY_CARE_PROVIDER_SITE_OTHER): Payer: Medicare HMO | Admitting: Pharmacist

## 2021-12-27 DIAGNOSIS — Z86711 Personal history of pulmonary embolism: Secondary | ICD-10-CM

## 2021-12-27 DIAGNOSIS — Z86718 Personal history of other venous thrombosis and embolism: Secondary | ICD-10-CM

## 2021-12-27 DIAGNOSIS — Z7901 Long term (current) use of anticoagulants: Secondary | ICD-10-CM

## 2021-12-27 LAB — POCT INR: INR: 2.5 (ref 2.0–3.0)

## 2021-12-27 NOTE — Progress Notes (Signed)
Anticoagulation Management Alexander English is a 65 y.o. male who reports to the clinic for monitoring of warfarin treatment.    Indication: DVT , history of, PE, History of; long term current use of oral anticoagulant, warfarin. Target INR 2.0 - 3.0.  Duration: indefinite Supervising physician:  Terrial Rhodes, MD  Anticoagulation Clinic Visit History: Patient does not report signs/symptoms of bleeding or thromboembolism  Other recent changes: No diet, medications, lifestyle changes reported by the patient at this visit upon questioning.  Anticoagulation Episode Summary     Current INR goal:  2.0-3.0  TTR:  82.3 % (9.8 y)  Next INR check:  01/31/2022  INR from last check:  2.5 (12/27/2021)  Weekly max warfarin dose:    Target end date:  Indefinite  INR check location:  Anticoagulation Clinic  Preferred lab:    Send INR reminders to:  [facility]   Indications   History of venous thromboembolism [Z86.718]        Comments:           No Known Allergies  Current Outpatient Medications:    aspirin EC 81 MG tablet, Take 81 mg by mouth daily., Disp: , Rfl:    atorvastatin (LIPITOR) 10 MG tablet, Take 1 tablet (10 mg total) by mouth daily., Disp: 90 tablet, Rfl: 3   fesoterodine (TOVIAZ) 8 MG TB24 tablet, Take 1 tablet (8 mg total) by mouth daily., Disp: 90 tablet, Rfl: 3   mirabegron ER (MYRBETRIQ) 50 MG TB24 tablet, Take 50 mg by mouth daily., Disp: , Rfl:    Multiple Vitamin (MULTIVITAMIN) tablet, Take 1 tablet by mouth daily., Disp: , Rfl:    propranolol ER (INDERAL LA) 60 MG 24 hr capsule, Take 1 capsule (60 mg total) by mouth daily., Disp: 90 capsule, Rfl: 1   warfarin (COUMADIN) 5 MG tablet, TAKE 1 TABLET BY MOUTH ONCE DAILY AT  6  IN  THE  EVENING  EXCEPT  ON  WEDNESDAY  AND  FRIDAY,  TAKE  ONLY  1/2  TABLET, Disp: 100 tablet, Rfl: 0   zolpidem (AMBIEN) 5 MG tablet, Take 1 tablet (5 mg total) by mouth at bedtime as needed for sleep., Disp: 30 tablet, Rfl: 2    tamsulosin (FLOMAX) 0.4 MG CAPS capsule, Take 0.4 mg by mouth daily. (Patient not taking: Reported on 11/15/2021), Disp: , Rfl:    traMADol (ULTRAM) 50 MG tablet, Take 50 mg by mouth every 6 (six) hours as needed. (Patient not taking: Reported on 11/15/2021), Disp: , Rfl:  Past Medical History:  Diagnosis Date   Aortic atherosclerosis ([place]) 10/23/2017   Asymptomatic, seen on CT scan   Bilateral cataracts 05/18/2016   Not visually significant   Cancer ([place])    maxillary   Cerebrovascular accident (CVA) ([place]) 03/27/2020   Chronic constipation 06/12/2013   Chronic low back pain 06/12/2013   L4-5 facet arthropathy    Chronic pain of both shoulders 12/07/2013   A/C joint osteoarthritis    Cluster headaches    Resolved in 2006   COPD (chronic obstructive pulmonary disease) ([place])    Dry eye syndrome, bilateral 82/99/3716   Embolic stroke involving right middle cerebral artery ([place]) 08/08/2004   Occured after traveling from [place] to [place] where he was hospitalized for 1 month with no further work-up or therapy.  Returned to [place] unable to walk and was admitted to [facility] immediately after landing. Presumed embolic per neuro but TEE, bubble study, and hypercoag W/U negative. On indefinite coumadin per  patient's informed preference.  Residual effects : Left spastic hemiplegia. Tx with phenol tibi   Generalized anxiety disorder 08/03/2018   History of kidney stones    Hyperplastic colon polyp 07/27/2012   Excised endoscopically 07/27/2012   Hypertensive retinopathy of both eyes 05/18/2016   Internal and external hemorrhoids without complication 12/18/7626   Seen on colonoscopy 04/03/2007.   Left nephrolithiasis 08/31/2015   With mild left hydronephrosis.  Scheduled to undergo shockwave lithotripsy.   Obesity (BMI 30.0-34.9) 12/07/2013   Osteoarthritis of both knees 01/26/2012   Positive PPD 12/07/2013   Pulmonary embolism ([place]) 09/30/2004   Diagnosed in April 2006 after returning from [place].  Likely  provoked as it occurred after a 12 hour plane flight within 2 months of R MCA CVA with left hemiparesis. Patient has made an informed decision to remain on lifelong warfarin.    Tubular adenoma 12/19/2017   Endoscopically excised 04/03/2007.   Urge incontinence of urine 08/31/2015   Possibly secondary to prior CVA.  Treated with Myrbetriq.   Social History   Socioeconomic History   Marital status: Single    Spouse name: Not on file   Number of children: 3   Years of education: 16   Highest education level: Not on file  Occupational History   Occupation: Retired  Tobacco Use   Smoking status: Former    Types: Cigarettes    Quit date: 06/28/2007    Years since quitting: 14.5   Smokeless tobacco: Never  Vaping Use   Vaping Use: Never used  Substance and Sexual Activity   Alcohol use: Yes    Comment: Rarely.   Drug use: No   Sexual activity: Not on file  Other Topics Concern   Not on file  Social History Narrative   Born in [place] but has been in the [place] for > 30 years.  Divorced, 1 daughter and 2 sons.  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   Right handed   Coffee/tea sometimes, soda rarely   Social Determinants of Health   Financial Resource Strain: Not on file  Food Insecurity: Not on file  Transportation Needs: Not on file  Physical Activity: Inactive (01/17/2019)   Exercise Vital Sign    Days of Exercise per Week: 0 days    Minutes of Exercise per Session: 0 min  Stress: No Stress Concern Present (01/17/2019)   [place]    Feeling of Stress : Only a little  Social Connections: Moderately Isolated (01/17/2019)   Social Connection and Isolation Panel [NHANES]    Frequency of Communication with Friends and Family: More than three times a week    Frequency of Social Gatherings with Friends and Family: Three times a week    Attends Religious Services: 1 to 4  times per year    Active Member of Clubs or Organizations: No    Attends Archivist Meetings: Never    Marital Status: Divorced   Family History  Problem Relation Age of Onset   Stroke Maternal Grandmother    Unexplained death Mother    Depression Mother        After husband's death   Unexplained death Father    Osteoarthritis Father        Knees   Early death Sister    Seizures Sister    Early death Brother 52       Unknown cause   Healthy  Daughter    Healthy Son    Stroke Maternal Aunt    Healthy Sister    Healthy Sister    Unexplained death Brother    Healthy Brother    Healthy Son     ASSESSMENT Recent Results: The most recent result is correlated with 30 mg per week: Lab Results  Component Value Date   INR 2.5 12/27/2021   INR 2.2 11/15/2021   INR 2.9 10/14/2021    Anticoagulation Dosing: Description   Take one of your '5mg'$  peach-colored warfarin tablets by mouth, once-daily--EXCEPT on Wednesdays and Fridays, take ONLY 1/2 tablet.        INR today: Therapeutic  PLAN Weekly dose was unchanged.   Patient Instructions  Patient instructed to take medications as defined in the Anti-coagulation Track section of this encounter.  Patient instructed to take today's dose.  Patient instructed to take one of your '5mg'$  peach-colored warfarin tablets by mouth, once-daily--EXCEPT on Wednesdays and Fridays, take ONLY 1/2 tablet.  Patient verbalized understanding of these instructions.   Patient advised to contact clinic or seek medical attention if signs/symptoms of bleeding or thromboembolism occur.  Patient verbalized understanding by repeating back information and was advised to contact me if further medication-related questions arise. Patient was also provided an information handout.  Follow-up Return in 5 weeks (on 01/31/2022) for Follow up INR.  [name], PharmD, CPP  15 minutes spent face-to-face with the patient during the encounter. 50% of time  spent on education, including signs/sx bleeding and clotting, as well as food and drug interactions with warfarin. 50% of time was spent on fingerprick POC INR sample collection,processing, results determination, and documentation in http://www.kim.net/.

## 2021-12-27 NOTE — Patient Instructions (Signed)
Patient instructed to take medications as defined in the Anti-coagulation Track section of this encounter.  Patient instructed to take today's dose.  Patient instructed to take one of your '5mg'$  peach-colored warfarin tablets by mouth, once-daily--EXCEPT on Wednesdays and Fridays, take ONLY 1/2 tablet.  Patient verbalized understanding of these instructions.

## 2021-12-29 NOTE — Progress Notes (Signed)
INTERNAL MEDICINE TEACHING ATTENDING ADDENDUM   I agree with pharmacy recommendations as outlined in their note.   Mackinley Kiehn, MD  

## 2022-01-31 ENCOUNTER — Ambulatory Visit (INDEPENDENT_AMBULATORY_CARE_PROVIDER_SITE_OTHER): Payer: Medicare HMO | Admitting: Pharmacist

## 2022-01-31 DIAGNOSIS — Z7901 Long term (current) use of anticoagulants: Secondary | ICD-10-CM | POA: Diagnosis not present

## 2022-01-31 DIAGNOSIS — Z86718 Personal history of other venous thrombosis and embolism: Secondary | ICD-10-CM | POA: Diagnosis not present

## 2022-01-31 DIAGNOSIS — Z86711 Personal history of pulmonary embolism: Secondary | ICD-10-CM | POA: Diagnosis not present

## 2022-01-31 LAB — POCT INR: INR: 2.6 (ref 2.0–3.0)

## 2022-01-31 MED ORDER — WARFARIN SODIUM 5 MG PO TABS: ORAL_TABLET | ORAL | 1 refills | Status: DC

## 2022-01-31 NOTE — Progress Notes (Signed)
Anticoagulation Management Alexander English is a 66 y.o. male who reports to the clinic for monitoring of warfarin treatment.    Indication: DVT and PE , History of; Long term current use of oral anticoagulant, warfarin. Target INR 2.0 - 3.0.  Duration: indefinite Supervising physician: Aldine Contes  Anticoagulation Clinic Visit History: Patient does not report signs/symptoms of bleeding or thromboembolism  Other recent changes: No diet, medications, lifestyle changes endorsed by the patient at this visit.  Anticoagulation Episode Summary     Current INR goal:  2.0-3.0  TTR:  82.5 % (9.9 y)  Next INR check:  03/07/2022  INR from last check:  2.6 (01/31/2022)  Weekly max warfarin dose:    Target end date:  Indefinite  INR check location:  Anticoagulation Clinic  Preferred lab:    Send INR reminders to:  [facility]   Indications   History of venous thromboembolism [Z86.718]        Comments:           No Known Allergies  Current Outpatient Medications:    aspirin EC 81 MG tablet, Take 81 mg by mouth daily., Disp: , Rfl:    atorvastatin (LIPITOR) 10 MG tablet, Take 1 tablet (10 mg total) by mouth daily., Disp: 90 tablet, Rfl: 3   fesoterodine (TOVIAZ) 8 MG TB24 tablet, Take 1 tablet (8 mg total) by mouth daily., Disp: 90 tablet, Rfl: 3   mirabegron ER (MYRBETRIQ) 50 MG TB24 tablet, Take 50 mg by mouth daily., Disp: , Rfl:    Multiple Vitamin (MULTIVITAMIN) tablet, Take 1 tablet by mouth daily., Disp: , Rfl:    propranolol ER (INDERAL LA) 60 MG 24 hr capsule, Take 1 capsule (60 mg total) by mouth daily., Disp: 90 capsule, Rfl: 1   zolpidem (AMBIEN) 5 MG tablet, Take 1 tablet (5 mg total) by mouth at bedtime as needed for sleep., Disp: 30 tablet, Rfl: 2   tamsulosin (FLOMAX) 0.4 MG CAPS capsule, Take 0.4 mg by mouth daily. (Patient not taking: Reported on 11/15/2021), Disp: , Rfl:    traMADol (ULTRAM) 50 MG tablet, Take 50 mg by mouth every 6 (six) hours as needed. (Patient  not taking: Reported on 11/15/2021), Disp: , Rfl:    warfarin (COUMADIN) 5 MG tablet, TAKE 1 TABLET BY MOUTH ONCE DAILY AT  6  IN  THE  EVENING  EXCEPT  ON  WEDNESDAY  AND  FRIDAY,  TAKE  ONLY  1/2  TABLET, Disp: 72 tablet, Rfl: 1 Past Medical History:  Diagnosis Date   Aortic atherosclerosis ([place]) 10/23/2017   Asymptomatic, seen on CT scan   Bilateral cataracts 05/18/2016   Not visually significant   Cancer ([place])    maxillary   Cerebrovascular accident (CVA) ([place]) 03/27/2020   Chronic constipation 06/12/2013   Chronic low back pain 06/12/2013   L4-5 facet arthropathy    Chronic pain of both shoulders 12/07/2013   A/C joint osteoarthritis    Cluster headaches    Resolved in 2006   COPD (chronic obstructive pulmonary disease) ([place])    Dry eye syndrome, bilateral 92/33/0076   Embolic stroke involving right middle cerebral artery ([place]) 08/08/2004   Occured after traveling from [place] to [place] where he was hospitalized for 1 month with no further work-up or therapy.  Returned to [place] unable to walk and was admitted to [facility] immediately after landing. Presumed embolic per neuro but TEE, bubble study, and hypercoag W/U negative. On indefinite coumadin per patient's informed preference.  Residual effects :  Left spastic hemiplegia. Tx with phenol tibi   Generalized anxiety disorder 08/03/2018   History of kidney stones    Hyperplastic colon polyp 07/27/2012   Excised endoscopically 07/27/2012   Hypertensive retinopathy of both eyes 05/18/2016   Internal and external hemorrhoids without complication 01/05/1974   Seen on colonoscopy 04/03/2007.   Left nephrolithiasis 08/31/2015   With mild left hydronephrosis.  Scheduled to undergo shockwave lithotripsy.   Obesity (BMI 30.0-34.9) 12/07/2013   Osteoarthritis of both knees 01/26/2012   Positive PPD 12/07/2013   Pulmonary embolism ([place]) 09/30/2004   Diagnosed in April 2006 after returning from [place].  Likely provoked as it occurred after a 12 hour  plane flight within 2 months of R MCA CVA with left hemiparesis. Patient has made an informed decision to remain on lifelong warfarin.    Tubular adenoma 12/19/2017   Endoscopically excised 04/03/2007.   Urge incontinence of urine 08/31/2015   Possibly secondary to prior CVA.  Treated with Myrbetriq.   Social History   Socioeconomic History   Marital status: Single    Spouse name: Not on file   Number of children: 3   Years of education: 16   Highest education level: Not on file  Occupational History   Occupation: Retired  Tobacco Use   Smoking status: Former    Types: Cigarettes    Quit date: 06/28/2007    Years since quitting: 14.6   Smokeless tobacco: Never  Vaping Use   Vaping Use: Never used  Substance and Sexual Activity   Alcohol use: Yes    Comment: Rarely.   Drug use: No   Sexual activity: Not on file  Other Topics Concern   Not on file  Social History Narrative   Born in [place] but has been in the [place] for > 30 years.  Divorced, 1 daughter and 2 sons.  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   Right handed   Coffee/tea sometimes, soda rarely   Social Determinants of Health   Financial Resource Strain: Not on file  Food Insecurity: Not on file  Transportation Needs: Not on file  Physical Activity: Inactive (01/17/2019)   Exercise Vital Sign    Days of Exercise per Week: 0 days    Minutes of Exercise per Session: 0 min  Stress: No Stress Concern Present (01/17/2019)   [place]    Feeling of Stress : Only a little  Social Connections: Moderately Isolated (01/17/2019)   Social Connection and Isolation Panel [NHANES]    Frequency of Communication with Friends and Family: More than three times a week    Frequency of Social Gatherings with Friends and Family: Three times a week    Attends Religious Services: 1 to 4 times per year    Active Member of Clubs  or Organizations: No    Attends Archivist Meetings: Never    Marital Status: Divorced   Family History  Problem Relation Age of Onset   Stroke Maternal Grandmother    Unexplained death Mother    Depression Mother        After husband's death   Unexplained death Father    Osteoarthritis Father        Knees   Early death Sister    Seizures Sister    Early death Brother 43       Unknown cause   Healthy Daughter    Healthy Son  Stroke Maternal Aunt    Healthy Sister    Healthy Sister    Unexplained death Brother    Healthy Brother    Healthy Son     ASSESSMENT Recent Results: The most recent result is correlated with 30 mg per week: Lab Results  Component Value Date   INR 2.6 01/31/2022   INR 2.5 12/27/2021   INR 2.2 11/15/2021    Anticoagulation Dosing: Description   Take one of your '5mg'$  peach-colored warfarin tablets by mouth, once-daily--EXCEPT on Wednesdays and Fridays, take ONLY 1/2 tablet.        INR today: Therapeutic  PLAN Weekly dose was unchanged.   Patient Instructions  Patient instructed to take medications as defined in the Anti-coagulation Track section of this encounter.  Patient instructed to take today's dose.  Patient instructed to take one of your '5mg'$  peach-colored warfarin tablets by mouth, once-daily--EXCEPT on Wednesdays and Fridays, take ONLY 1/2 tablet.  Patient verbalized understanding of these instructions.   Patient advised to contact clinic or seek medical attention if signs/symptoms of bleeding or thromboembolism occur.  Patient verbalized understanding by repeating back information and was advised to contact me if further medication-related questions arise. Patient was also provided an information handout.  Follow-up Return in 5 weeks (on 03/07/2022) for Follow up INR.  [name], PharmD, CPP  15 minutes spent face-to-face with the patient during the encounter. 50% of time spent on education, including signs/sx  bleeding and clotting, as well as food and drug interactions with warfarin. 50% of time was spent on fingerprick POC INR sample collection,processing, results determination, and documentation in http://www.kim.net/.

## 2022-01-31 NOTE — Patient Instructions (Signed)
Patient instructed to take medications as defined in the Anti-coagulation Track section of this encounter.  Patient instructed to take today's dose.  Patient instructed to take one of your '5mg'$  peach-colored warfarin tablets by mouth, once-daily--EXCEPT on Wednesdays and Fridays, take ONLY 1/2 tablet.  Patient verbalized understanding of these instructions.

## 2022-02-02 NOTE — Progress Notes (Signed)
INTERNAL MEDICINE TEACHING ATTENDING ADDENDUM - Kalasia Crafton M.D  Duration- indefinite, Indication- PE, INR- therapeutic. Agree with pharmacy recommendations as outlined in their note.     

## 2022-02-11 DIAGNOSIS — C310 Malignant neoplasm of maxillary sinus: Secondary | ICD-10-CM | POA: Diagnosis not present

## 2022-03-07 ENCOUNTER — Ambulatory Visit (INDEPENDENT_AMBULATORY_CARE_PROVIDER_SITE_OTHER): Payer: Medicare HMO | Admitting: Pharmacist

## 2022-03-07 DIAGNOSIS — Z86718 Personal history of other venous thrombosis and embolism: Secondary | ICD-10-CM | POA: Diagnosis not present

## 2022-03-07 LAB — POCT INR: INR: 3.2 — AB (ref 2.0–3.0)

## 2022-03-07 NOTE — Patient Instructions (Signed)
Patient instructed to take medications as defined in the Anti-coagulation Track section of this encounter.  Patient instructed to take today's dose.  Patient instructed to take one of your '5mg'$  peach-colored warfarin tablets by mouth, once-daily--EXCEPT on Mondays, Wednesdays and Fridays, take ONLY 1/2 tablet.  Patient verbalized understanding of these instructions.

## 2022-03-07 NOTE — Progress Notes (Signed)
Anticoagulation Management Alexander English is a 66 y.o. male who reports to the clinic for monitoring of warfarin treatment.    Indication: PE, history of; long time current use of oral anticoagulant warfarin to maintain INR 2.0 - 3.0.  Duration: indefinite Supervising physician: [facility] Visit History: Patient does not report signs/symptoms of bleeding or thromboembolism  Other recent changes: No diet, medications, lifestyle changes identified.  Anticoagulation Episode Summary     Current INR goal:  2.0-3.0  TTR:  82.4 % (9.9 y)  Next INR check:  04/04/2022  INR from last check:  3.2 (03/07/2022)  Weekly max warfarin dose:    Target end date:  Indefinite  INR check location:  Anticoagulation Clinic  Preferred lab:    Send INR reminders to:  [facility]   Indications   History of venous thromboembolism [Z86.718]        Comments:           No Known Allergies  Current Outpatient Medications:    aspirin EC 81 MG tablet, Take 81 mg by mouth daily., Disp: , Rfl:    atorvastatin (LIPITOR) 10 MG tablet, Take 1 tablet (10 mg total) by mouth daily., Disp: 90 tablet, Rfl: 3   fesoterodine (TOVIAZ) 8 MG TB24 tablet, Take 1 tablet (8 mg total) by mouth daily., Disp: 90 tablet, Rfl: 3   mirabegron ER (MYRBETRIQ) 50 MG TB24 tablet, Take 50 mg by mouth daily., Disp: , Rfl:    Multiple Vitamin (MULTIVITAMIN) tablet, Take 1 tablet by mouth daily., Disp: , Rfl:    propranolol ER (INDERAL LA) 60 MG 24 hr capsule, Take 1 capsule (60 mg total) by mouth daily., Disp: 90 capsule, Rfl: 1   warfarin (COUMADIN) 5 MG tablet, TAKE 1 TABLET BY MOUTH ONCE DAILY AT  6  IN  THE  EVENING  EXCEPT  ON  WEDNESDAY  AND  FRIDAY,  TAKE  ONLY  1/2  TABLET, Disp: 72 tablet, Rfl: 1   zolpidem (AMBIEN) 5 MG tablet, Take 1 tablet (5 mg total) by mouth at bedtime as needed for sleep., Disp: 30 tablet, Rfl: 2   tamsulosin (FLOMAX) 0.4 MG CAPS capsule, Take 0.4 mg by mouth daily. (Patient  not taking: Reported on 11/15/2021), Disp: , Rfl:    traMADol (ULTRAM) 50 MG tablet, Take 50 mg by mouth every 6 (six) hours as needed. (Patient not taking: Reported on 11/15/2021), Disp: , Rfl:  Past Medical History:  Diagnosis Date   Aortic atherosclerosis ([place]) 10/23/2017   Asymptomatic, seen on CT scan   Bilateral cataracts 05/18/2016   Not visually significant   Cancer ([place])    maxillary   Cerebrovascular accident (CVA) ([place]) 03/27/2020   Chronic constipation 06/12/2013   Chronic low back pain 06/12/2013   L4-5 facet arthropathy    Chronic pain of both shoulders 12/07/2013   A/C joint osteoarthritis    Cluster headaches    Resolved in 2006   COPD (chronic obstructive pulmonary disease) ([place])    Dry eye syndrome, bilateral 10/93/2355   Embolic stroke involving right middle cerebral artery ([place]) 08/08/2004   Occured after traveling from [place] to [place] where he was hospitalized for 1 month with no further work-up or therapy.  Returned to [place] unable to walk and was admitted to [facility] immediately after landing. Presumed embolic per neuro but TEE, bubble study, and hypercoag W/U negative. On indefinite coumadin per patient's informed preference.  Residual effects : Left spastic hemiplegia. Tx with phenol tibi  Generalized anxiety disorder 08/03/2018   History of kidney stones    Hyperplastic colon polyp 07/27/2012   Excised endoscopically 07/27/2012   Hypertensive retinopathy of both eyes 05/18/2016   Internal and external hemorrhoids without complication 62/94/7654   Seen on colonoscopy 04/03/2007.   Left nephrolithiasis 08/31/2015   With mild left hydronephrosis.  Scheduled to undergo shockwave lithotripsy.   Obesity (BMI 30.0-34.9) 12/07/2013   Osteoarthritis of both knees 01/26/2012   Positive PPD 12/07/2013   Pulmonary embolism ([place]) 09/30/2004   Diagnosed in April 2006 after returning from [place].  Likely provoked as it occurred after a 12 hour plane flight within 2 months of R MCA  CVA with left hemiparesis. Patient has made an informed decision to remain on lifelong warfarin.    Tubular adenoma 12/19/2017   Endoscopically excised 04/03/2007.   Urge incontinence of urine 08/31/2015   Possibly secondary to prior CVA.  Treated with Myrbetriq.   Social History   Socioeconomic History   Marital status: Single    Spouse name: Not on file   Number of children: 3   Years of education: 16   Highest education level: Not on file  Occupational History   Occupation: Retired  Tobacco Use   Smoking status: Former    Types: Cigarettes    Quit date: 06/28/2007    Years since quitting: 14.7   Smokeless tobacco: Never  Vaping Use   Vaping Use: Never used  Substance and Sexual Activity   Alcohol use: Yes    Comment: Rarely.   Drug use: No   Sexual activity: Not on file  Other Topics Concern   Not on file  Social History Narrative   Born in [place] but has been in the [place] for > 30 years.  Divorced, 1 daughter and 2 sons.  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   Right handed   Coffee/tea sometimes, soda rarely   Social Determinants of Health   Financial Resource Strain: Not on file  Food Insecurity: Not on file  Transportation Needs: Not on file  Physical Activity: Inactive (01/17/2019)   Exercise Vital Sign    Days of Exercise per Week: 0 days    Minutes of Exercise per Session: 0 min  Stress: No Stress Concern Present (01/17/2019)   [place]    Feeling of Stress : Only a little  Social Connections: Moderately Isolated (01/17/2019)   Social Connection and Isolation Panel [NHANES]    Frequency of Communication with Friends and Family: More than three times a week    Frequency of Social Gatherings with Friends and Family: Three times a week    Attends Religious Services: 1 to 4 times per year    Active Member of Clubs or Organizations: No    Attends English as a second language teacher Meetings: Never    Marital Status: Divorced   Family History  Problem Relation Age of Onset   Stroke Maternal Grandmother    Unexplained death Mother    Depression Mother        After husband's death   Unexplained death Father    Osteoarthritis Father        Knees   Early death Sister    Seizures Sister    Early death Brother 48       Unknown cause   Healthy Daughter    Healthy Son    Stroke Maternal Aunt    Healthy  Sister    Healthy Sister    Unexplained death Brother    Healthy Brother    Healthy Son     ASSESSMENT Recent Results: The most recent result is correlated with 30 mg per week: Lab Results  Component Value Date   INR 3.2 (A) 03/07/2022   INR 2.6 01/31/2022   INR 2.5 12/27/2021    Anticoagulation Dosing: Description   Take one of your '5mg'$  peach-colored warfarin tablets by mouth, once-daily--EXCEPT on Mondays, Wednesdays and Fridays, take ONLY 1/2 tablet.        INR today: Supratherapeutic  PLAN Weekly dose was decreased by 8% to 27.5 mg per week  Patient Instructions  Patient instructed to take medications as defined in the Anti-coagulation Track section of this encounter.  Patient instructed to take today's dose.  Patient instructed to take one of your '5mg'$  peach-colored warfarin tablets by mouth, once-daily--EXCEPT on Mondays, Wednesdays and Fridays, take ONLY 1/2 tablet.  Patient verbalized understanding of these instructions.   Patient advised to contact clinic or seek medical attention if signs/symptoms of bleeding or thromboembolism occur.  Patient verbalized understanding by repeating back information and was advised to contact me if further medication-related questions arise. Patient was also provided an information handout.  Follow-up Return in 4 weeks (on 04/04/2022) for Follow up INR.  [name], PharmD, CPP  15 minutes spent face-to-face with the patient during the encounter. 50% of time spent on education,  including signs/sx bleeding and clotting, as well as food and drug interactions with warfarin. 50% of time was spent on fingerprick POC INR sample collection,processing, results determination, and documentation in [email or web address]/.

## 2022-03-31 DIAGNOSIS — R221 Localized swelling, mass and lump, neck: Secondary | ICD-10-CM | POA: Diagnosis not present

## 2022-04-04 ENCOUNTER — Ambulatory Visit: Payer: Medicare HMO

## 2022-04-11 ENCOUNTER — Ambulatory Visit (INDEPENDENT_AMBULATORY_CARE_PROVIDER_SITE_OTHER): Payer: Medicare HMO | Admitting: Pharmacist

## 2022-04-11 DIAGNOSIS — Z7901 Long term (current) use of anticoagulants: Secondary | ICD-10-CM | POA: Diagnosis not present

## 2022-04-11 DIAGNOSIS — Z86718 Personal history of other venous thrombosis and embolism: Secondary | ICD-10-CM

## 2022-04-11 LAB — POCT INR: INR: 2.1 (ref 2.0–3.0)

## 2022-04-11 NOTE — Progress Notes (Signed)
Anticoagulation Management Alexander English is a 66 y.o. male who reports to the clinic for monitoring of warfarin treatment.    Indication: CVA, History of with sequelae; long term current use of oral anticoagulant, warfarin. Target INR range 2.0 - 3.0.  Duration: indefinite Supervising physician: [facility] Visit History: Patient does not report signs/symptoms of bleeding or thromboembolism  Other recent changes: No diet, medications, lifestyle changes endorsed.  Anticoagulation Episode Summary     Current INR goal:  2.0-3.0  TTR:  82.4 % (10 y)  Next INR check:  05/09/2022  INR from last check:  2.1 (04/11/2022)  Weekly max warfarin dose:    Target end date:  Indefinite  INR check location:  Anticoagulation Clinic  Preferred lab:    Send INR reminders to:  [facility]   Indications   History of venous thromboembolism [Z86.718]        Comments:           No Known Allergies  Current Outpatient Medications:    aspirin EC 81 MG tablet, Take 81 mg by mouth daily., Disp: , Rfl:    atorvastatin (LIPITOR) 10 MG tablet, Take 1 tablet (10 mg total) by mouth daily., Disp: 90 tablet, Rfl: 3   fesoterodine (TOVIAZ) 8 MG TB24 tablet, Take 1 tablet (8 mg total) by mouth daily., Disp: 90 tablet, Rfl: 3   mirabegron ER (MYRBETRIQ) 50 MG TB24 tablet, Take 50 mg by mouth daily., Disp: , Rfl:    Multiple Vitamin (MULTIVITAMIN) tablet, Take 1 tablet by mouth daily., Disp: , Rfl:    propranolol ER (INDERAL LA) 60 MG 24 hr capsule, Take 1 capsule (60 mg total) by mouth daily., Disp: 90 capsule, Rfl: 1   warfarin (COUMADIN) 5 MG tablet, TAKE 1 TABLET BY MOUTH ONCE DAILY AT  6  IN  THE  EVENING  EXCEPT  ON  WEDNESDAY  AND  FRIDAY,  TAKE  ONLY  1/2  TABLET, Disp: 72 tablet, Rfl: 1   zolpidem (AMBIEN) 5 MG tablet, Take 1 tablet (5 mg total) by mouth at bedtime as needed for sleep., Disp: 30 tablet, Rfl: 2   tamsulosin (FLOMAX) 0.4 MG CAPS capsule, Take 0.4 mg by  mouth daily. (Patient not taking: Reported on 11/15/2021), Disp: , Rfl:    traMADol (ULTRAM) 50 MG tablet, Take 50 mg by mouth every 6 (six) hours as needed. (Patient not taking: Reported on 11/15/2021), Disp: , Rfl:  Past Medical History:  Diagnosis Date   Aortic atherosclerosis ([place]) 10/23/2017   Asymptomatic, seen on CT scan   Bilateral cataracts 05/18/2016   Not visually significant   Cancer ([place])    maxillary   Cerebrovascular accident (CVA) ([place]) 03/27/2020   Chronic constipation 06/12/2013   Chronic low back pain 06/12/2013   L4-5 facet arthropathy    Chronic pain of both shoulders 12/07/2013   A/C joint osteoarthritis    Cluster headaches    Resolved in 2006   COPD (chronic obstructive pulmonary disease) ([place])    Dry eye syndrome, bilateral 09/38/1829   Embolic stroke involving right middle cerebral artery ([place]) 08/08/2004   Occured after traveling from [place] to [place] where he was hospitalized for 1 month with no further work-up or therapy.  Returned to [place] unable to walk and was admitted to [facility] immediately after landing. Presumed embolic per neuro but TEE, bubble study, and hypercoag W/U negative. On indefinite coumadin per patient's informed preference.  Residual effects : Left spastic hemiplegia. Tx with  phenol tibi   Generalized anxiety disorder 08/03/2018   History of kidney stones    Hyperplastic colon polyp 07/27/2012   Excised endoscopically 07/27/2012   Hypertensive retinopathy of both eyes 05/18/2016   Internal and external hemorrhoids without complication 44/81/8563   Seen on colonoscopy 04/03/2007.   Left nephrolithiasis 08/31/2015   With mild left hydronephrosis.  Scheduled to undergo shockwave lithotripsy.   Obesity (BMI 30.0-34.9) 12/07/2013   Osteoarthritis of both knees 01/26/2012   Positive PPD 12/07/2013   Pulmonary embolism ([place]) 09/30/2004   Diagnosed in April 2006 after returning from [place].  Likely provoked as it occurred after a 12 hour plane flight  within 2 months of R MCA CVA with left hemiparesis. Patient has made an informed decision to remain on lifelong warfarin.    Tubular adenoma 12/19/2017   Endoscopically excised 04/03/2007.   Urge incontinence of urine 08/31/2015   Possibly secondary to prior CVA.  Treated with Myrbetriq.   Social History   Socioeconomic History   Marital status: Single    Spouse name: Not on file   Number of children: 3   Years of education: 16   Highest education level: Not on file  Occupational History   Occupation: Retired  Tobacco Use   Smoking status: Former    Types: Cigarettes    Quit date: 06/28/2007    Years since quitting: 14.7   Smokeless tobacco: Never  Vaping Use   Vaping Use: Never used  Substance and Sexual Activity   Alcohol use: Yes    Comment: Rarely.   Drug use: No   Sexual activity: Not on file  Other Topics Concern   Not on file  Social History Narrative   Born in [place] but has been in the [place] for > 30 years.  Divorced, 1 daughter and 2 sons.  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   Right handed   Coffee/tea sometimes, soda rarely   Social Determinants of Health   Financial Resource Strain: Not on file  Food Insecurity: Not on file  Transportation Needs: Not on file  Physical Activity: Inactive (01/17/2019)   Exercise Vital Sign    Days of Exercise per Week: 0 days    Minutes of Exercise per Session: 0 min  Stress: No Stress Concern Present (01/17/2019)   [place]    Feeling of Stress : Only a little  Social Connections: Moderately Isolated (01/17/2019)   Social Connection and Isolation Panel [NHANES]    Frequency of Communication with Friends and Family: More than three times a week    Frequency of Social Gatherings with Friends and Family: Three times a week    Attends Religious Services: 1 to 4 times per year    Active Member of Clubs or  Organizations: No    Attends Archivist Meetings: Never    Marital Status: Divorced   Family History  Problem Relation Age of Onset   Stroke Maternal Grandmother    Unexplained death Mother    Depression Mother        After husband's death   Unexplained death Father    Osteoarthritis Father        Knees   Early death Sister    Seizures Sister    Early death Brother 40       Unknown cause   Healthy Daughter    Healthy Son    Stroke Maternal Aunt  Healthy Sister    Healthy Sister    Unexplained death Brother    Healthy Brother    Healthy Son     ASSESSMENT Recent Results: The most recent result is correlated with 27.5 mg per week: Lab Results  Component Value Date   INR 2.1 04/11/2022   INR 3.2 (A) 03/07/2022   INR 2.6 01/31/2022    Anticoagulation Dosing: Description   Take one of your '5mg'$  peach-colored warfarin tablets by mouth, once-daily--EXCEPT on TUESDAYS and FRIDAYS, take ONLY 1/2 tablet.        INR today: Therapeutic  PLAN Weekly dose was increased by 9% to 30 mg per week  Patient Instructions  Patient instructed to take medications as defined in the Anti-coagulation Track section of this encounter.  Patient instructed to take today's dose.  Patient instructed to take one of your '5mg'$  peach-colored warfarin tablets by mouth, once-daily--EXCEPT on TUESDAYS and FRIDAYS, take ONLY 1/2 tablet. Patient verbalized understanding of these instructions.  Patient advised to contact clinic or seek medical attention if signs/symptoms of bleeding or thromboembolism occur.  Patient verbalized understanding by repeating back information and was advised to contact me if further medication-related questions arise. Patient was also provided an information handout.  Follow-up Return in 4 weeks (on 05/09/2022) for Follow up INR.  [name], PharmD, CPP  15 minutes spent face-to-face with the patient during the encounter. 50% of time spent on education,  including signs/sx bleeding and clotting, as well as food and drug interactions with warfarin. 50% of time was spent on fingerprick POC INR sample collection,processing, results determination, and documentation in [email or web address].kim.net/.

## 2022-04-11 NOTE — Progress Notes (Signed)
Evaluation and management procedures were performed by the Clinical Pharmacy Practitioner under my supervision and collaboration. I have reviewed the Practitioner's note and chart, and I agree with the management and plan as documented above. ° °

## 2022-04-11 NOTE — Patient Instructions (Signed)
Patient instructed to take medications as defined in the Anti-coagulation Track section of this encounter.  Patient instructed to take today's dose.  Patient instructed to take one of your '5mg'$  peach-colored warfarin tablets by mouth, once-daily--EXCEPT on TUESDAYS and FRIDAYS, take ONLY 1/2 tablet. Patient verbalized understanding of these instructions.

## 2022-05-05 ENCOUNTER — Ambulatory Visit (INDEPENDENT_AMBULATORY_CARE_PROVIDER_SITE_OTHER): Payer: Medicare HMO | Admitting: Internal Medicine

## 2022-05-05 ENCOUNTER — Encounter: Payer: Self-pay | Admitting: Internal Medicine

## 2022-05-05 ENCOUNTER — Ambulatory Visit (INDEPENDENT_AMBULATORY_CARE_PROVIDER_SITE_OTHER): Payer: Medicare HMO | Admitting: Pharmacist

## 2022-05-05 ENCOUNTER — Other Ambulatory Visit: Payer: Self-pay

## 2022-05-05 VITALS — BP 118/74 | HR 54 | Temp 98.1°F | Ht 72.0 in | Wt 230.2 lb

## 2022-05-05 DIAGNOSIS — Z86718 Personal history of other venous thrombosis and embolism: Secondary | ICD-10-CM

## 2022-05-05 DIAGNOSIS — G8110 Spastic hemiplegia affecting unspecified side: Secondary | ICD-10-CM | POA: Diagnosis not present

## 2022-05-05 DIAGNOSIS — I639 Cerebral infarction, unspecified: Secondary | ICD-10-CM

## 2022-05-05 DIAGNOSIS — Z0000 Encounter for general adult medical examination without abnormal findings: Secondary | ICD-10-CM | POA: Diagnosis not present

## 2022-05-05 DIAGNOSIS — Z122 Encounter for screening for malignant neoplasm of respiratory organs: Secondary | ICD-10-CM

## 2022-05-05 DIAGNOSIS — Z86711 Personal history of pulmonary embolism: Secondary | ICD-10-CM | POA: Diagnosis not present

## 2022-05-05 LAB — POCT INR: INR: 2.1 (ref 2.0–3.0)

## 2022-05-05 MED ORDER — WARFARIN SODIUM 5 MG PO TABS: 5.0000 mg | ORAL_TABLET | Freq: Every day | ORAL | 0 refills | Status: DC

## 2022-05-05 NOTE — Patient Instructions (Signed)
  [name] , Thank you for taking time to come for your Medicare Wellness Visit. I appreciate your ongoing commitment to your health goals. Please review the following plan we discussed and let me know if I can assist you in the future.   These are the goals we discussed:  Goals      Weight < 185 lb (83.915 kg)        This is a list of the screening recommended for you and due dates:  Health Maintenance  Topic Date Due   Medicare Annual Wellness Visit  Never done   Zoster (Shingles) Vaccine (1 of 2) Never done   COVID-19 Vaccine (4 - Mixed Product risk series) 05/26/2022   Pneumonia Vaccine (3 - PPSV23 or PCV20) 10/16/2024   Tetanus Vaccine  03/26/2025   Colon Cancer Screening  05/23/2028   Flu Shot  Completed   Hepatitis C Screening: USPSTF Recommendation to screen - Ages 18-79 yo.  Completed   HPV Vaccine  Aged Out

## 2022-05-05 NOTE — Patient Instructions (Signed)
Patient instructed to take medications as defined in the Anti-coagulation Track section of this encounter.  Patient instructed to take today's dose.  Patient instructed to take one (1) of your 5 mg peach-colored warfarin tablets, by mouth, once-daily. Patient verbalized understanding of these instructions.

## 2022-05-05 NOTE — Progress Notes (Signed)
Anticoagulation Management Alexander English is a 65 y.o. male who reports to the clinic for monitoring of warfarin treatment.    Indication:  History of DVT, History of CVA, Long term current use of oral anticoagulant, warfarin. Target INR range, 2.0 - 3.0.   Duration: indefinite Supervising physician: Lalla Brothers  Anticoagulation Clinic Visit History: Patient does not report signs/symptoms of bleeding or thromboembolism  Other recent changes: No diet, medications, lifestyle changes.  Anticoagulation Episode Summary     Current INR goal:  2.0-3.0  TTR:  82.5 % (10.1 y)  Next INR check:  06/13/2022  INR from last check:  2.1 (05/05/2022)  Weekly max warfarin dose:    Target end date:  Indefinite  INR check location:  Anticoagulation Clinic  Preferred lab:    Send INR reminders to:  [facility]   Indications   History of venous thromboembolism [Z86.718]        Comments:           No Known Allergies  Current Outpatient Medications:    aspirin EC 81 MG tablet, Take 81 mg by mouth daily., Disp: , Rfl:    atorvastatin (LIPITOR) 10 MG tablet, Take 1 tablet (10 mg total) by mouth daily., Disp: 90 tablet, Rfl: 3   fesoterodine (TOVIAZ) 8 MG TB24 tablet, Take 1 tablet (8 mg total) by mouth daily., Disp: 90 tablet, Rfl: 3   mirabegron ER (MYRBETRIQ) 50 MG TB24 tablet, Take 50 mg by mouth daily., Disp: , Rfl:    Multiple Vitamin (MULTIVITAMIN) tablet, Take 1 tablet by mouth daily., Disp: , Rfl:    propranolol ER (INDERAL LA) 60 MG 24 hr capsule, Take 1 capsule (60 mg total) by mouth daily., Disp: 90 capsule, Rfl: 1   zolpidem (AMBIEN) 5 MG tablet, Take 1 tablet (5 mg total) by mouth at bedtime as needed for sleep., Disp: 30 tablet, Rfl: 2   tamsulosin (FLOMAX) 0.4 MG CAPS capsule, Take 0.4 mg by mouth daily. (Patient not taking: Reported on 11/15/2021), Disp: , Rfl:    traMADol (ULTRAM) 50 MG tablet, Take 50 mg by mouth every 6 (six) hours as needed. (Patient not taking: Reported  on 11/15/2021), Disp: , Rfl:    warfarin (COUMADIN) 5 MG tablet, Take 1 tablet (5 mg total) by mouth daily at 4 PM., Disp: 180 tablet, Rfl: 0 Past Medical History:  Diagnosis Date   Aortic atherosclerosis ([place]) 10/23/2017   Asymptomatic, seen on CT scan   Bilateral cataracts 05/18/2016   Not visually significant   Cancer ([place])    maxillary   Cerebrovascular accident (CVA) ([place]) 03/27/2020   Chronic constipation 06/12/2013   Chronic low back pain 06/12/2013   L4-5 facet arthropathy    Chronic pain of both shoulders 12/07/2013   A/C joint osteoarthritis    Cluster headaches    Resolved in 2006   COPD (chronic obstructive pulmonary disease) ([place])    Dry eye syndrome, bilateral 35/57/3220   Embolic stroke involving right middle cerebral artery ([place]) 08/08/2004   Occured after traveling from [place] to [place] where he was hospitalized for 1 month with no further work-up or therapy.  Returned to [place] unable to walk and was admitted to [facility] immediately after landing. Presumed embolic per neuro but TEE, bubble study, and hypercoag W/U negative. On indefinite coumadin per patient's informed preference.  Residual effects : Left spastic hemiplegia. Tx with phenol tibi   Generalized anxiety disorder 08/03/2018   History of kidney stones    Hyperplastic colon polyp  07/27/2012   Excised endoscopically 07/27/2012   Hypertensive retinopathy of both eyes 05/18/2016   Internal and external hemorrhoids without complication 19/50/9326   Seen on colonoscopy 04/03/2007.   Left nephrolithiasis 08/31/2015   With mild left hydronephrosis.  Scheduled to undergo shockwave lithotripsy.   Obesity (BMI 30.0-34.9) 12/07/2013   Osteoarthritis of both knees 01/26/2012   Positive PPD 12/07/2013   Pulmonary embolism ([place]) 09/30/2004   Diagnosed in April 2006 after returning from [place].  Likely provoked as it occurred after a 12 hour plane flight within 2 months of R MCA CVA with left hemiparesis. Patient has made an  informed decision to remain on lifelong warfarin.    Tubular adenoma 12/19/2017   Endoscopically excised 04/03/2007.   Urge incontinence of urine 08/31/2015   Possibly secondary to prior CVA.  Treated with Myrbetriq.   Social History   Socioeconomic History   Marital status: Single    Spouse name: Not on file   Number of children: 3   Years of education: 16   Highest education level: Not on file  Occupational History   Occupation: Retired  Tobacco Use   Smoking status: Former    Types: Cigarettes    Quit date: 06/28/2007    Years since quitting: 14.8   Smokeless tobacco: Never  Vaping Use   Vaping Use: Never used  Substance and Sexual Activity   Alcohol use: Yes    Comment: Rarely.   Drug use: No   Sexual activity: Not on file  Other Topics Concern   Not on file  Social History Narrative   Born in [place] but has been in the [place] for > 30 years.  Divorced, 1 daughter and 2 sons.  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   Right handed   Coffee/tea sometimes, soda rarely   Social Determinants of Health   Financial Resource Strain: Not on file  Food Insecurity: Not on file  Transportation Needs: Not on file  Physical Activity: Inactive (01/17/2019)   Exercise Vital Sign    Days of Exercise per Week: 0 days    Minutes of Exercise per Session: 0 min  Stress: No Stress Concern Present (01/17/2019)   [place]    Feeling of Stress : Only a little  Social Connections: Moderately Isolated (01/17/2019)   Social Connection and Isolation Panel [NHANES]    Frequency of Communication with Friends and Family: More than three times a week    Frequency of Social Gatherings with Friends and Family: Three times a week    Attends Religious Services: 1 to 4 times per year    Active Member of Clubs or Organizations: No    Attends Archivist Meetings: Never    Marital  Status: Divorced   Family History  Problem Relation Age of Onset   Stroke Maternal Grandmother    Unexplained death Mother    Depression Mother        After husband's death   Unexplained death Father    Osteoarthritis Father        Knees   Early death Sister    Seizures Sister    Early death Brother 27       Unknown cause   Healthy Daughter    Healthy Son    Stroke Maternal Aunt    Healthy Sister    Healthy Sister    Unexplained death Brother    Healthy  Brother    Healthy Son     ASSESSMENT Recent Results: The most recent result is correlated with 30 mg per week: Lab Results  Component Value Date   INR 2.1 05/05/2022   INR 2.1 04/11/2022   INR 3.2 (A) 03/07/2022    Anticoagulation Dosing: Description   Take one of your '5mg'$  peach-colored warfarin tablets by mouth, once-daily.        INR today: Therapeutic  PLAN Weekly dose was increased by 16% to 35 mg per week  Patient Instructions  Patient instructed to take medications as defined in the Anti-coagulation Track section of this encounter.  Patient instructed to take today's dose.  Patient instructed to take one (1) of your 5 mg peach-colored warfarin tablets, by mouth, once-daily. Patient verbalized understanding of these instructions.  Patient advised to contact clinic or seek medical attention if signs/symptoms of bleeding or thromboembolism occur.  Patient verbalized understanding by repeating back information and was advised to contact me if further medication-related questions arise. Patient was also provided an information handout.  Follow-up Return in 6 weeks (on 06/13/2022) for Follow up INR.  [name], PharmD, CPP  15 minutes spent face-to-face with the patient during the encounter. 50% of time spent on education, including signs/sx bleeding and clotting, as well as food and drug interactions with warfarin. 50% of time was spent on fingerprick POC INR sample collection,processing, results  determination, and documentation in [email or web address].[email or web address]/.

## 2022-05-05 NOTE — Progress Notes (Signed)
INTERNAL MEDICINE TEACHING ATTENDING ADDENDUM ° °I agree with pharmacy recommendations as outlined in their note.  ° °-Damaria Stofko MD ° °

## 2022-05-05 NOTE — Progress Notes (Signed)
Annual Wellness Visit     Patient: Alexander English, Male    DOB: [birth year], 66 y.o.   MRN: [id number]  Subjective  Chief Complaint  Patient presents with   Follow-up    Need new rx for a brace or referral to [facility]. Note to renew his passport.    Alexander English is a 66 y.o. male who presents today for his Annual Wellness Visit. He reports consuming a general and african carbohydrate rich  diet. The patient does not participate in regular exercise at present. Concerned about knee  He generally feels fairly well. He reports sleeping well. He does have additional problems to discuss today.   He is requesting a brace or referral to [facility].  He has a history of CVA and results suspect cystic hemiplegia of his left foot for which she wears a AFO brace but that has not been resized in some time.  Additionally he reports that he has been requested to travel to [facility] to see if solving issue about his passport he does not think that he can make this drive and is requesting a letter.  Vision:Within last year and Dental: No current dental problems, Receives regular dental care, and Last dental visit: may 2023, has dentures   Patient Active Problem List   Diagnosis Date Noted   Bright red rectal bleeding 12/03/2020   Left hip pain 10/15/2020   Prediabetes 10/15/2020   Chest pain 05/07/2020   Tremor 03/27/2020   Mild cognitive impairment 03/27/2020   Action tremor 01/10/2020   Memory changes 01/10/2020   Primary osteoarthritis of both hips 10/20/2019   Serum calcium elevated 01/22/2019   Epidermal inclusion cyst 08/08/2018   Generalized anxiety disorder 08/03/2018   Elevated blood pressure reading in office without diagnosis of hypertension 08/03/2018   Acute left-sided low back pain with left-sided sciatica 07/02/2018   Tubular adenoma 12/19/2017   Internal and external hemorrhoids without complication 62/86/3817   Aortic atherosclerosis ([place]) 10/23/2017    Erectile dysfunction due to arterial insufficiency 10/21/2016   Bilateral cataracts 05/18/2016   Hypertensive retinopathy of both eyes 05/18/2016   Dry eye syndrome, bilateral 05/18/2016   Left nephrolithiasis 08/31/2015   Urge incontinence of urine 08/31/2015   Onychomycosis of left great toe 03/19/2015   Chronic pain of both shoulders 12/07/2013   Positive PPD 12/07/2013   Obesity (BMI 30.0-34.9) 12/07/2013   Chronic low back pain 06/12/2013   Chronic constipation 06/12/2013   Healthcare maintenance 03/21/2012   Spastic hemiplegia affecting nondominant side ([place]) 01/26/2012   Osteoarthritis of both knees 01/26/2012   Hyperlipidemia 11/16/2006   History of venous thromboembolism 09/30/2004   History of stroke with residual deficit 08/08/2004   Past Medical History:  Diagnosis Date   Aortic atherosclerosis ([place]) 10/23/2017   Asymptomatic, seen on CT scan   Bilateral cataracts 05/18/2016   Not visually significant   Cancer ([place])    maxillary   Cerebrovascular accident (CVA) ([place]) 03/27/2020   Chronic constipation 06/12/2013   Chronic low back pain 06/12/2013   L4-5 facet arthropathy    Chronic pain of both shoulders 12/07/2013   A/C joint osteoarthritis    Cluster headaches    Resolved in 2006   COPD (chronic obstructive pulmonary disease) ([place])    Dry eye syndrome, bilateral 71/16/5790   Embolic stroke involving right middle cerebral artery ([place]) 08/08/2004   Occured after traveling from [place] to [place] where he was hospitalized for 1 month with no further work-up or therapy.  Returned to [place] unable to walk and was admitted to [facility] immediately after landing. Presumed embolic per neuro but TEE, bubble study, and hypercoag W/U negative. On indefinite coumadin per patient's informed preference.  Residual effects : Left spastic hemiplegia. Tx with phenol tibi   Generalized anxiety disorder 08/03/2018   History of kidney stones    Hyperplastic colon polyp 07/27/2012   Excised  endoscopically 07/27/2012   Hypertensive retinopathy of both eyes 05/18/2016   Internal and external hemorrhoids without complication 66/44/0347   Seen on colonoscopy 04/03/2007.   Left nephrolithiasis 08/31/2015   With mild left hydronephrosis.  Scheduled to undergo shockwave lithotripsy.   Obesity (BMI 30.0-34.9) 12/07/2013   Osteoarthritis of both knees 01/26/2012   Positive PPD 12/07/2013   Pulmonary embolism ([place]) 09/30/2004   Diagnosed in April 2006 after returning from [place].  Likely provoked as it occurred after a 12 hour plane flight within 2 months of R MCA CVA with left hemiparesis. Patient has made an informed decision to remain on lifelong warfarin.    Tubular adenoma 12/19/2017   Endoscopically excised 04/03/2007.   Urge incontinence of urine 08/31/2015   Possibly secondary to prior CVA.  Treated with Myrbetriq.      Medications: Outpatient Medications Prior to Visit  Medication Sig   aspirin EC 81 MG tablet Take 81 mg by mouth daily.   atorvastatin (LIPITOR) 10 MG tablet Take 1 tablet (10 mg total) by mouth daily.   fesoterodine (TOVIAZ) 8 MG TB24 tablet Take 1 tablet (8 mg total) by mouth daily.   mirabegron ER (MYRBETRIQ) 50 MG TB24 tablet Take 50 mg by mouth daily.   Multiple Vitamin (MULTIVITAMIN) tablet Take 1 tablet by mouth daily.   propranolol ER (INDERAL LA) 60 MG 24 hr capsule Take 1 capsule (60 mg total) by mouth daily.   tamsulosin (FLOMAX) 0.4 MG CAPS capsule Take 0.4 mg by mouth daily. (Patient not taking: Reported on 11/15/2021)   traMADol (ULTRAM) 50 MG tablet Take 50 mg by mouth every 6 (six) hours as needed. (Patient not taking: Reported on 11/15/2021)   warfarin (COUMADIN) 5 MG tablet Take 1 tablet (5 mg total) by mouth daily at 4 PM.   zolpidem (AMBIEN) 5 MG tablet Take 1 tablet (5 mg total) by mouth at bedtime as needed for sleep.   No facility-administered medications prior to visit.    No Known Allergies  Patient Care Team: [name], DO  as PCP - General (Internal Medicine) [name], MD as Consulting Physician (Otolaryngology) Felipa Furnace, DPM as Consulting Physician (Podiatry) Janith Lima, MD as Consulting Physician (Urology) Dr Elie Confer, warfarin management      Objective  BP 118/74 (BP Location: Right Arm, Patient Position: Sitting, Cuff Size: Large)   Pulse (!) 54   Temp 98.1 F (36.7 C) (Oral)   Ht 6' (1.829 m)   Wt 230 lb 3.2 oz (104.4 kg)   SpO2 100% Comment: RA  BMI 31.22 kg/m  BP Readings from Last 3 Encounters:  05/05/22 118/74  10/14/21 111/67  08/24/21 123/75   Wt Readings from Last 3 Encounters:  05/05/22 230 lb 3.2 oz (104.4 kg)  10/14/21 238 lb 11.2 oz (108.3 kg)  08/24/21 239 lb 8 oz (108.6 kg)      Physical Exam Constitutional:      Appearance: Normal appearance.  Eyes:     Comments: Wearing sunglasses  Cardiovascular:     Rate and Rhythm: Normal rate and regular rhythm.  Pulmonary:  Effort: Pulmonary effort is normal.     Breath sounds: Normal breath sounds.  Abdominal:     General: Abdomen is flat.     Palpations: Abdomen is soft.  Musculoskeletal:     Comments: Brace left leg  Neurological:     Mental Status: He is alert.  Psychiatric:        Mood and Affect: Mood normal.        Behavior: Behavior normal.       Most recent functional status assessment:    05/05/2022   11:34 AM  In your present state of health, do you have any difficulty performing the following activities:  Hearing? 0  Vision? 1  Difficulty concentrating or making decisions? 0  Walking or climbing stairs? 1  Dressing or bathing? 0  Doing errands, shopping? 0   Most recent fall risk assessment:    05/05/2022   11:34 AM  Fall Risk   Falls in the past year? 0  Number falls in past yr: 0  Injury with Fall? 0  Risk for fall due to : Impaired balance/gait  Follow up Falls evaluation completed;Falls prevention discussed    Most recent depression screenings:    05/05/2022   11:34 AM  10/14/2021   10:51 AM  PHQ 2/9 Scores  PHQ - 2 Score 0 0   Most recent cognitive screening: Mini cog 4/5   Most recent Audit-C alcohol use screening    01/17/2019   11:11 AM  Alcohol Use Disorder Test (AUDIT)  1. How often do you have a drink containing alcohol? 0  2. How many drinks containing alcohol do you have on a typical day when you are drinking? 0  3. How often do you have six or more drinks on one occasion? 0  AUDIT-C Score 0   A score of 3 or more in women, and 4 or more in men indicates increased risk for alcohol abuse, EXCEPT if all of the points are from question 1   Vision/Hearing Screen: No results found.  Last CBC Lab Results  Component Value Date   WBC 5.4 02/12/2021   HGB 13.7 02/12/2021   HCT 44.5 02/12/2021   MCV 84.9 02/12/2021   MCH 26.1 02/12/2021   RDW 14.5 02/12/2021   PLT 169 50/35/4656   Last metabolic panel Lab Results  Component Value Date   GLUCOSE 93 10/14/2021   NA 141 10/14/2021   K 4.1 10/14/2021   CL 106 10/14/2021   CO2 19 (L) 10/14/2021   BUN 12 10/14/2021   CREATININE 0.86 10/14/2021   EGFR 95 10/14/2021   CALCIUM 9.6 10/14/2021   PROT 8.0 10/15/2020   ALBUMIN 4.5 10/15/2020   LABGLOB 3.5 10/15/2020   AGRATIO 1.3 10/15/2020   BILITOT 0.4 10/15/2020   ALKPHOS 83 10/15/2020   AST 21 10/15/2020   ALT 25 10/15/2020   ANIONGAP 6 02/12/2021   Last lipids Lab Results  Component Value Date   CHOL 99 (L) 10/14/2021   HDL 44 10/14/2021   LDLCALC 43 10/14/2021   TRIG 50 10/14/2021   CHOLHDL 2.3 10/14/2021      Results for orders placed or performed in visit on 05/05/22  POCT INR  Result Value Ref Range   INR 2.1 2.0 - 3.0   POC INR        Assessment & Plan   Annual wellness visit done today including the all of the following: Reviewed patient's Family Medical History Reviewed and updated list of patient's medical providers  Assessment of cognitive impairment was done Assessed patient's functional  ability Established a written schedule for health screening [place] Completed and Reviewed  Exercise Activities and Dietary recommendations  Goals      Weight < 185 lb (83.915 kg)        Immunization History  Administered Date(s) Administered   Fluad Quad(high Dose 65+) 03/22/2021   Influenza Split 05/07/2012   Influenza, High Dose Seasonal PF 03/31/2022   Influenza,inj,Quad PF,6+ Mos 03/22/2013, 04/02/2014, 03/09/2015, 02/15/2016, 03/31/2017, 03/16/2018, 03/11/2019, 03/09/2020   Moderna Covid-19 Vaccine Bivalent Booster 59yr & up 05/04/2021   PFIZER(Purple Top)SARS-COV-2 Vaccination 04/25/2020   Pneumococcal Conjugate-13 08/03/2018   Pneumococcal Polysaccharide-23 10/17/2019   Td 01/24/2005   Tdap 03/27/2015   Unspecified SARS-COV-2 Vaccination 03/31/2022   Zoster, Live 06/01/2016    Health Maintenance  Topic Date Due   Zoster Vaccines- Shingrix (1 of 2) Never done   COVID-19 Vaccine (4 - Mixed Product risk series) 05/26/2022   Medicare Annual Wellness (AWV)  05/06/2023   Pneumonia Vaccine 67 Years old (3 - PPSV23 or PCV20) 10/16/2024   TETANUS/TDAP  03/26/2025   COLONOSCOPY (Pts 45-459yrInsurance coverage will need to be confirmed)  05/23/2028   INFLUENZA VACCINE  Completed   Hepatitis C Screening  Completed   HPV VACCINES  Aged Out     Discussed health benefits of physical activity, and encouraged him to engage in regular exercise appropriate for his age and condition.    Problem List Items Addressed This Visit       Nervous and Auditory   Spastic hemiplegia affecting nondominant side (HCC) - Primary (Chronic)    Referral to PT for orthosis refitting. If this cannot be done will refer to [facility]      Relevant Orders   PT orthosis to lower extremity     Other   History of venous thromboembolism (Chronic)    Wrote letter for passport agency      Other Visit Diagnoses     Screening for lung cancer       Relevant Orders    CT CHEST LUNG CA SCREEN LOW DOSE W/O CM       Return in about 6 months (around 11/03/2022), or if symptoms worsen or fail to improve.     ErLucious GrovesDO

## 2022-05-06 NOTE — Assessment & Plan Note (Signed)
Referral to PT for orthosis refitting. If this cannot be done will refer to [facility]

## 2022-05-06 NOTE — Assessment & Plan Note (Signed)
Wrote letter for passport agency

## 2022-05-23 ENCOUNTER — Other Ambulatory Visit: Payer: Self-pay

## 2022-05-23 DIAGNOSIS — G252 Other specified forms of tremor: Secondary | ICD-10-CM

## 2022-05-24 MED ORDER — PROPRANOLOL HCL ER 60 MG PO CP24: 60.0000 mg | ORAL_CAPSULE | Freq: Every day | ORAL | 3 refills | Status: DC

## 2022-05-25 ENCOUNTER — Other Ambulatory Visit: Payer: Self-pay | Admitting: *Deleted

## 2022-06-10 ENCOUNTER — Telehealth: Payer: Self-pay | Admitting: Internal Medicine

## 2022-06-10 NOTE — Telephone Encounter (Signed)
Please advise.  The patient is calling in

## 2022-06-13 ENCOUNTER — Ambulatory Visit: Payer: Medicare HMO

## 2022-07-04 ENCOUNTER — Ambulatory Visit (INDEPENDENT_AMBULATORY_CARE_PROVIDER_SITE_OTHER): Payer: Medicare HMO | Admitting: Pharmacist

## 2022-07-04 ENCOUNTER — Telehealth: Payer: Self-pay | Admitting: *Deleted

## 2022-07-04 DIAGNOSIS — I639 Cerebral infarction, unspecified: Secondary | ICD-10-CM

## 2022-07-04 DIAGNOSIS — Z86718 Personal history of other venous thrombosis and embolism: Secondary | ICD-10-CM

## 2022-07-04 DIAGNOSIS — Z79899 Other long term (current) drug therapy: Secondary | ICD-10-CM | POA: Diagnosis not present

## 2022-07-04 DIAGNOSIS — Z7901 Long term (current) use of anticoagulants: Secondary | ICD-10-CM | POA: Diagnosis not present

## 2022-07-04 DIAGNOSIS — Z8673 Personal history of transient ischemic attack (TIA), and cerebral infarction without residual deficits: Secondary | ICD-10-CM | POA: Diagnosis not present

## 2022-07-04 LAB — POCT INR: INR: 3 (ref 2.0–3.0)

## 2022-07-04 NOTE — Patient Instructions (Signed)
Patient instructed to take medications as defined in the Anti-coagulation Track section of this encounter.  Patient instructed to take today's dose.  Patient instructed to take one of your '5mg'$  peach-colored warfarin tablets by mouth, once-daily--EXCEPT on MONDAYS, take ONLY one-half (1/2) tablet on MONDAYS. Patient verbalized understanding of these instructions.

## 2022-07-04 NOTE — Addendum Note (Signed)
Addended by: Joni Reining C on: 07/04/2022 10:00 AM   Modules accepted: Orders

## 2022-07-04 NOTE — Progress Notes (Signed)
Anticoagulation Management Alexander English is a 67 y.o. male who reports to the clinic for monitoring of warfarin treatment.    Indication: DVT , History of; CVA, History of; Long term current use of oral anticoagulant, warfarin. Target INR 2. 0 -- 3.0.  Duration: indefinite Supervising physician: Joni Reining  Anticoagulation Clinic Visit History: Patient does not report signs/symptoms of bleeding or thromboembolism  Other recent changes: No diet, medications, lifestyle changes.  Anticoagulation Episode Summary     Current INR goal:  2.0-3.0  TTR:  82.8 % (10.3 y)  Next INR check:  08/08/2022  INR from last check:  3.0 (07/04/2022)  Weekly max warfarin dose:    Target end date:  Indefinite  INR check location:  Anticoagulation Clinic  Preferred lab:    Send INR reminders to:  [facility]   Indications   History of venous thromboembolism [Z86.718]        Comments:           No Known Allergies  Current Outpatient Medications:    aspirin EC 81 MG tablet, Take 81 mg by mouth daily., Disp: , Rfl:    atorvastatin (LIPITOR) 10 MG tablet, Take 1 tablet (10 mg total) by mouth daily., Disp: 90 tablet, Rfl: 3   fesoterodine (TOVIAZ) 8 MG TB24 tablet, Take 1 tablet (8 mg total) by mouth daily., Disp: 90 tablet, Rfl: 3   mirabegron ER (MYRBETRIQ) 50 MG TB24 tablet, Take 50 mg by mouth daily., Disp: , Rfl:    Multiple Vitamin (MULTIVITAMIN) tablet, Take 1 tablet by mouth daily., Disp: , Rfl:    propranolol ER (INDERAL LA) 60 MG 24 hr capsule, Take 1 capsule (60 mg total) by mouth daily., Disp: 90 capsule, Rfl: 3   warfarin (COUMADIN) 5 MG tablet, Take 1 tablet (5 mg total) by mouth daily at 4 PM., Disp: 180 tablet, Rfl: 0   zolpidem (AMBIEN) 5 MG tablet, Take 1 tablet (5 mg total) by mouth at bedtime as needed for sleep., Disp: 30 tablet, Rfl: 2   tamsulosin (FLOMAX) 0.4 MG CAPS capsule, Take 0.4 mg by mouth daily. (Patient not taking: Reported on 11/15/2021), Disp: , Rfl:     traMADol (ULTRAM) 50 MG tablet, Take 50 mg by mouth every 6 (six) hours as needed. (Patient not taking: Reported on 11/15/2021), Disp: , Rfl:  Past Medical History:  Diagnosis Date   Aortic atherosclerosis ([place]) 10/23/2017   Asymptomatic, seen on CT scan   Bilateral cataracts 05/18/2016   Not visually significant   Cancer ([place])    maxillary   Cerebrovascular accident (CVA) ([place]) 03/27/2020   Chronic constipation 06/12/2013   Chronic low back pain 06/12/2013   L4-5 facet arthropathy    Chronic pain of both shoulders 12/07/2013   A/C joint osteoarthritis    Cluster headaches    Resolved in 2006   COPD (chronic obstructive pulmonary disease) ([place])    Dry eye syndrome, bilateral 97/41/6384   Embolic stroke involving right middle cerebral artery ([place]) 08/08/2004   Occured after traveling from [place] to [place] where he was hospitalized for 1 month with no further work-up or therapy.  Returned to [place] unable to walk and was admitted to [facility] immediately after landing. Presumed embolic per neuro but TEE, bubble study, and hypercoag W/U negative. On indefinite coumadin per patient's informed preference.  Residual effects : Left spastic hemiplegia. Tx with phenol tibi   Generalized anxiety disorder 08/03/2018   History of kidney stones    Hyperplastic colon polyp 07/27/2012  Excised endoscopically 07/27/2012   Hypertensive retinopathy of both eyes 05/18/2016   Internal and external hemorrhoids without complication 26/94/8546   Seen on colonoscopy 04/03/2007.   Left nephrolithiasis 08/31/2015   With mild left hydronephrosis.  Scheduled to undergo shockwave lithotripsy.   Obesity (BMI 30.0-34.9) 12/07/2013   Osteoarthritis of both knees 01/26/2012   Positive PPD 12/07/2013   Pulmonary embolism ([place]) 09/30/2004   Diagnosed in April 2006 after returning from [place].  Likely provoked as it occurred after a 12 hour plane flight within 2 months of R MCA CVA with left hemiparesis. Patient has made an  informed decision to remain on lifelong warfarin.    Tubular adenoma 12/19/2017   Endoscopically excised 04/03/2007.   Urge incontinence of urine 08/31/2015   Possibly secondary to prior CVA.  Treated with Myrbetriq.   Social History   Socioeconomic History   Marital status: Single    Spouse name: Not on file   Number of children: 3   Years of education: 16   Highest education level: Not on file  Occupational History   Occupation: Retired  Tobacco Use   Smoking status: Former    Types: Cigarettes    Quit date: 06/28/2007    Years since quitting: 15.0   Smokeless tobacco: Never  Vaping Use   Vaping Use: Never used  Substance and Sexual Activity   Alcohol use: Yes    Comment: Rarely.   Drug use: No   Sexual activity: Not on file  Other Topics Concern   Not on file  Social History Narrative   Born in [place] but has been in the [place] for > 30 years.  Divorced, 1 daughter and 2 sons.  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   Right handed   Coffee/tea sometimes, soda rarely   Social Determinants of Health   Financial Resource Strain: Medium Risk (05/05/2022)   Overall Financial Resource Strain (CARDIA)    Difficulty of Paying Living Expenses: Somewhat hard  Food Insecurity: No Food Insecurity (05/05/2022)   Hunger Vital Sign    Worried About Running Out of Food in the Last Year: Never true    Ran Out of Food in the Last Year: Never true  Transportation Needs: Unknown (05/05/2022)   PRAPARE - Hydrologist (Medical): No    Lack of Transportation (Non-Medical): Not on file  Physical Activity: Inactive (01/17/2019)   Exercise Vital Sign    Days of Exercise per Week: 0 days    Minutes of Exercise per Session: 0 min  Stress: No Stress Concern Present (01/17/2019)   [place]    Feeling of Stress : Only a little  Social Connections: Moderately  Isolated (01/17/2019)   Social Connection and Isolation Panel [NHANES]    Frequency of Communication with Friends and Family: More than three times a week    Frequency of Social Gatherings with Friends and Family: Three times a week    Attends Religious Services: 1 to 4 times per year    Active Member of Clubs or Organizations: No    Attends Archivist Meetings: Never    Marital Status: Divorced   Family History  Problem Relation Age of Onset   Stroke Maternal Grandmother    Unexplained death Mother    Depression Mother        After husband's death   Unexplained death Father  Osteoarthritis Father        Knees   Early death Sister    Seizures Sister    Early death Brother 90       Unknown cause   Healthy Daughter    Healthy Son    Stroke Maternal Aunt    Healthy Sister    Healthy Sister    Unexplained death Brother    Healthy Brother    Healthy Son     ASSESSMENT Recent Results: The most recent result is correlated with 35 mg per week: Lab Results  Component Value Date   INR 3.0 07/04/2022   INR 2.1 05/05/2022   INR 2.1 04/11/2022    Anticoagulation Dosing: Description   Take one of your '5mg'$  peach-colored warfarin tablets by mouth, once-daily--EXCEPT on MONDAYS, take ONLY one-half (1/2) tablet on MONDAYS.        INR today: Therapeutic  PLAN Weekly dose was decreased by 7% to 32.5 mg per week  Patient Instructions  Patient instructed to take medications as defined in the Anti-coagulation Track section of this encounter.  Patient instructed to take today's dose.  Patient instructed to take one of your '5mg'$  peach-colored warfarin tablets by mouth, once-daily--EXCEPT on MONDAYS, take ONLY one-half (1/2) tablet on MONDAYS. Patient verbalized understanding of these instructions.  Patient advised to contact clinic or seek medical attention if signs/symptoms of bleeding or thromboembolism occur.  Patient verbalized understanding by repeating back  information and was advised to contact me if further medication-related questions arise. Patient was also provided an information handout.  Follow-up Return in 5 weeks (on 08/08/2022) for Follow up INR.  Alexander English, PharmD, CPP  15 minutes spent face-to-face with the patient during the encounter. 50% of time spent on education, including signs/sx bleeding and clotting, as well as food and drug interactions with warfarin. 50% of time was spent on fingerprick POC INR sample collection,processing, results determination, and documentation in http://www.kim.net/.

## 2022-07-04 NOTE — Telephone Encounter (Signed)
Agree, he has known hemorrhoids, no need for referral to gi at this point

## 2022-07-04 NOTE — Telephone Encounter (Signed)
Patient here for appointment with Dr. Jerrell Belfast.  Saw blood on tissue last Wednesday x 1.  This is the second time this has happened to him. Thinks he may have strained to have the BM.  Happened the last time about a year ago.

## 2022-07-12 ENCOUNTER — Ambulatory Visit (HOSPITAL_COMMUNITY): Payer: Medicare HMO

## 2022-07-15 ENCOUNTER — Ambulatory Visit: Payer: Medicare HMO | Attending: Internal Medicine | Admitting: Physical Therapy

## 2022-07-15 VITALS — BP 143/74 | HR 68

## 2022-07-15 DIAGNOSIS — R2689 Other abnormalities of gait and mobility: Secondary | ICD-10-CM | POA: Diagnosis not present

## 2022-07-15 DIAGNOSIS — M6281 Muscle weakness (generalized): Secondary | ICD-10-CM | POA: Diagnosis not present

## 2022-07-15 DIAGNOSIS — R2681 Unsteadiness on feet: Secondary | ICD-10-CM | POA: Diagnosis not present

## 2022-07-15 DIAGNOSIS — G8110 Spastic hemiplegia affecting unspecified side: Secondary | ICD-10-CM | POA: Insufficient documentation

## 2022-07-15 NOTE — Therapy (Signed)
OUTPATIENT PHYSICAL THERAPY NEURO EVALUATION   Patient Name: Alexander English MRN: [id number] DOB:10/04/1955, 67 y.o., male Today's Date: 07/15/2022   PCP: [name], DO REFERRING PROVIDER: [name], DO  END OF SESSION:  PT End of Session - 07/15/22 1107     Visit Number 1    Number of Visits 9   with eval   Date for PT Re-Evaluation 09/23/22   to allow for scheduling delays   Authorization Type [employer]    PT Start Time 1100    PT Stop Time 1200    PT Time Calculation (min) 60 min    Activity Tolerance Patient tolerated treatment well    Behavior During Therapy [facility] for tasks assessed/performed             Past Medical History:  Diagnosis Date   Aortic atherosclerosis ([place]) 10/23/2017   Asymptomatic, seen on CT scan   Bilateral cataracts 05/18/2016   Not visually significant   Cancer ([place])    maxillary   Cerebrovascular accident (CVA) ([place]) 03/27/2020   Chronic constipation 06/12/2013   Chronic low back pain 06/12/2013   L4-5 facet arthropathy    Chronic pain of both shoulders 12/07/2013   A/C joint osteoarthritis    Cluster headaches    Resolved in 2006   COPD (chronic obstructive pulmonary disease) ([place])    Dry eye syndrome, bilateral 41/28/7867   Embolic stroke involving right middle cerebral artery ([place]) 08/08/2004   Occured after traveling from [place] to [place] where he was hospitalized for 1 month with no further work-up or therapy.  Returned to [place] unable to walk and was admitted to [facility] immediately after landing. Presumed embolic per neuro but TEE, bubble study, and hypercoag W/U negative. On indefinite coumadin per patient's informed preference.  Residual effects : Left spastic hemiplegia. Tx with phenol tibi   Generalized anxiety disorder 08/03/2018   History of kidney stones    Hyperplastic colon polyp 07/27/2012   Excised endoscopically 07/27/2012   Hypertensive retinopathy of both eyes 05/18/2016   Internal and external hemorrhoids without  complication 67/20/9470   Seen on colonoscopy 04/03/2007.   Left nephrolithiasis 08/31/2015   With mild left hydronephrosis.  Scheduled to undergo shockwave lithotripsy.   Obesity (BMI 30.0-34.9) 12/07/2013   Osteoarthritis of both knees 01/26/2012   Positive PPD 12/07/2013   Pulmonary embolism ([place]) 09/30/2004   Diagnosed in April 2006 after returning from [place].  Likely provoked as it occurred after a 12 hour plane flight within 2 months of R MCA CVA with left hemiparesis. Patient has made an informed decision to remain on lifelong warfarin.    Tubular adenoma 12/19/2017   Endoscopically excised 04/03/2007.   Urge incontinence of urine 08/31/2015   Possibly secondary to prior CVA.  Treated with Myrbetriq.   Past Surgical History:  Procedure Laterality Date   CYSTOSCOPY W/ RETROGRADES Right 02/22/2021   Procedure: CYSTOSCOPY WITH RETROGRADE PYELOGRAM/ URETEROSCOPY/ POSSIBLE BIOPSY OF LESION, POSSIBLE RIGHT STENT PLACEMENT;  Surgeon: Janith Lima, MD;  Location: [facility];  Service: Urology;  Laterality: Right;  ONLY NEEDS 30 MIN   EXCISION NASAL MASS Left 10/06/2017   Procedure: EXCISION LEFT NASAL MASS;  Surgeon: [name], MD;  Location: [facility];  Service: ENT;  Laterality: Left;  Removal of intvering papilloma frozen section   FOOT SURGERY Bilateral    Bunion   I & D EXTREMITY Left 11/28/2001   Left thumb thenar space abscess.   LITHOTRIPSY  2017   MAXILLECTOMY Left  11/22/2017   [name] 11/22/2017   MAXILLECTOMY Left 11/22/2017   Procedure: MAXILLECTOMY;  Surgeon: [name], MD;  Location: [facility];  Service: ENT;  Laterality: Left;   NASAL SINUS SURGERY Left 10/06/2017   Procedure: ENDOSCOPIC SINUS SURGERY;  Surgeon: [name], MD;  Location: [facility];  Service: ENT;  Laterality: Left;  Left side Endoscopic Macillary and Ethmoid Sinus   SKIN SPLIT GRAFT Left 11/22/2017   Procedure: SKIN GRAFT SPLIT THICKNESS;  Surgeon: [name], MD;  Location: [facility];  Service: ENT;  Laterality:  Left;   Patient Active Problem List   Diagnosis Date Noted   Bright red rectal bleeding 12/03/2020   Left hip pain 10/15/2020   Prediabetes 10/15/2020   Chest pain 05/07/2020   Tremor 03/27/2020   Mild cognitive impairment 03/27/2020   Action tremor 01/10/2020   Memory changes 01/10/2020   Primary osteoarthritis of both hips 10/20/2019   Serum calcium elevated 01/22/2019   Epidermal inclusion cyst 08/08/2018   Generalized anxiety disorder 08/03/2018   Elevated blood pressure reading in office without diagnosis of hypertension 08/03/2018   Acute left-sided low back pain with left-sided sciatica 07/02/2018   Tubular adenoma 12/19/2017   Internal and external hemorrhoids without complication 22/29/7989   Aortic atherosclerosis ([place]) 10/23/2017   Erectile dysfunction due to arterial insufficiency 10/21/2016   Bilateral cataracts 05/18/2016   Hypertensive retinopathy of both eyes 05/18/2016   Dry eye syndrome, bilateral 05/18/2016   Left nephrolithiasis 08/31/2015   Urge incontinence of urine 08/31/2015   Onychomycosis of left great toe 03/19/2015   Chronic pain of both shoulders 12/07/2013   Positive PPD 12/07/2013   Obesity (BMI 30.0-34.9) 12/07/2013   Chronic low back pain 06/12/2013   Chronic constipation 06/12/2013   Healthcare maintenance 03/21/2012   Spastic hemiplegia affecting nondominant side ([place]) 01/26/2012   Osteoarthritis of both knees 01/26/2012   Hyperlipidemia 11/16/2006   History of venous thromboembolism 09/30/2004   History of stroke with residual deficit 08/08/2004    ONSET DATE: 07/04/2022  REFERRING DIAG: G81.10 (ICD-10-CM) - Spastic hemiplegia affecting nondominant side (HCC)  THERAPY DIAG:  Muscle weakness (generalized)  Other abnormalities of gait and mobility  Unsteadiness on feet  Rationale for Evaluation and Treatment: Rehabilitation  SUBJECTIVE:                                                                                                                                                                                              SUBJECTIVE STATEMENT: Pt reports he had a stroke in 2006. Initially he was walking with just an AFO but was doing a lot of walking while at  school at [employer], started having knee pain that worsened. Pt went to PT at [facility] and that is when he got the KAFO about 5 years ao. Pt reports he wears just the AFO in his apartment but wears his KAFO in the community. Pt reports that he was told by an orthopedic doctor that he would not benefit from having surgery on his L knee based on his impairments.  Pt accompanied by: self  PERTINENT HISTORY: per PCP visit 05/05/2022: SHNEUR WHITTENBURG is a 67 y.o. male who presents today for his Annual Wellness Visit. He reports consuming a general and african carbohydrate rich  diet. The patient does not participate in regular exercise at present. Concerned about knee  He generally feels fairly well. He reports sleeping well. He does have additional problems to discuss today.    He is requesting a brace or referral to [place] [facility].  He has a history of CVA and results suspect cystic hemiplegia of his left foot for which he wears a AFO brace but that has not been resized in some time.  PAIN:  Are you having pain? Yes: NPRS scale: not rated/10 Pain location: back of knee Pain description: soreness Aggravating factors: walking Relieving factors: rest  PRECAUTIONS: Fall  WEIGHT BEARING RESTRICTIONS: No  FALLS: Has patient fallen in last 6 months? No and frequent LOB but no falls  LIVING ENVIRONMENT: Lives with: lives alone Lives in: House/apartment Stairs: Yes: External: 2 steps; on right going up Has following equipment at home: Single point cane and shower chair  PLOF: Independent with gait, Independent with transfers, and Requires assistive device for independence  PATIENT GOALS: "I want to work on the overextension of my L knee"  OBJECTIVE:    DIAGNOSTIC FINDINGS:  Brain MRI 04/14/20 IMPRESSION:    MRI brain (without) demonstrating: - Chronic ischemic infarct in the right parietal and peri-insular region. - Moderate chronic small vessel ischemic disease. - No acute findings.  COGNITION: Overall cognitive status: Within functional limits for tasks assessed   SENSATION: Tingling on sole of L foot, numbness on R thigh, slightly impaired proprioceptoin distal LLE; impaired proprioception in LUE  EDEMA:  Swelling in distal LLE per pt report  POSTURE: rounded shoulders, forward head, and posterior pelvic tilt  LOWER EXTREMITY ROM:     Passive  Right Eval Left Eval  Hip flexion  Tight hip flexors  Hip extension    Hip abduction    Hip adduction    Hip internal rotation    Hip external rotation    Knee flexion    Knee extension  Tight HS  Ankle dorsiflexion  PF contracture  Ankle plantarflexion    Ankle inversion    Ankle eversion     (Blank rows = not tested)  LOWER EXTREMITY MMT:    MMT Right Eval Left Eval  Hip flexion 5 2-  Hip extension    Hip abduction    Hip adduction    Hip internal rotation    Hip external rotation    Knee flexion 5 2-  Knee extension 5 3  Ankle dorsiflexion 5 0  Ankle plantarflexion    Ankle inversion    Ankle eversion    (Blank rows = not tested)  BED MOBILITY:  Independent per pt report, does not lay on L side  TRANSFERS: Assistive device utilized: Single point cane  Sit to stand: Modified independence Stand to sit: Modified independence Chair to chair: Modified independence Floor:  not assessed at eval  GAIT: Gait pattern: decreased step length- Left, decreased stance time- Right, decreased hip/knee flexion- Left, decreased ankle dorsiflexion- Left, circumduction- Left, genu recurvatum- Left, and poor foot clearance- Left Distance walked: 5 ft Assistive device utilized: Single point cane Level of assistance: Min A Comments: trial gait without KAFO  Gait  pattern: decreased hip/knee flexion- Left, decreased ankle dorsiflexion- Left, circumduction- Left, and genu recurvatum- Left Distance walked: various clinic distances Assistive device utilized: Single point cane Level of assistance: Modified independence Comments: with KAFO  FUNCTIONAL TESTS:   FOTO: 52%   [facility] PT Assessment - 07/15/22 1139       Ambulation/Gait   Gait velocity 32.8 ft over 34.5 sec = 0.95 ft/sec      Standardized Balance Assessment   Standardized Balance Assessment Timed Up and Go Test;Five Times Sit to Stand    Five times sit to stand comments  41.28 sec   pushing up with RUE from arm of chair     Timed Up and Go Test   TUG Normal TUG    Normal TUG (seconds) 40   with SPC            TODAY'S TREATMENT:                                                                                                                              PT Evaluation  PATIENT EDUCATION: Education details: Eval findings, PT POC, OM results and functional implications Person educated: Patient Education method: Explanation and Demonstration Education comprehension: verbalized understanding, returned demonstration, and needs further education  HOME EXERCISE PROGRAM: To be initiated next session  GOALS: Goals reviewed with patient? Yes  SHORT TERM GOALS: Target date: 08/12/2022  Pt will be independent with initial HEP for improved strength, balance, transfers and gait. Baseline: Goal status: INITIAL  2.  Pt to trial various bracing options to determine most appropriate orthotic to assist with LLE control during gait Baseline: KAFO (1/19) Goal status: INITIAL  3.  Pt will improve 5 x STS to less than or equal to 38 seconds to demonstrate improved functional strength and transfer efficiency.  Baseline: 41.28 sec (1/19) Goal status: INITIAL  4.  Pt will improve gait velocity to at least 1.25 ft/sec for improved gait efficiency and performance at mod I level  Baseline: 0.95  ft/sec with SPC (1/19) Goal status: INITIAL  5.  Pt will improve normal TUG to less than or equal to 35 seconds for improved functional mobility and decreased fall risk. Baseline: 40 sec with SPC (1/19) Goal status: INITIAL   LONG TERM GOALS: Target date: 09/09/2022   Pt will be independent with final HEP for improved strength, balance, transfers and gait. Baseline:  Goal status: INITIAL  2.  Pt will improve 5 x STS to less than or equal to 35 seconds to demonstrate improved functional strength and transfer efficiency.  Baseline: 41.28 sec (1/19) Goal status: INITIAL  3.  Pt will improve gait velocity  to at least 1.5 ft/sec for improved gait efficiency and performance at mod I level  Baseline: 0.95 ft/sec with SPC (1/19) Goal status: INITIAL  4.  Pt will improve normal TUG to less than or equal to 30 seconds for improved functional mobility and decreased fall risk. Baseline: 40 sec with SPC (1/19) Goal status: INITIAL  5.  Pt will improve FOTO score to at least 54% to demonstrate improved function. Baseline: 52% (1/19) Goal status: INITIAL   ASSESSMENT:  CLINICAL IMPRESSION: Patient is a 67 year old male referred to Neuro OPPT for chronic stroke.   Pt's PMH is significant for: CVA. The following deficits were present during the exam: decreased LLE strength and ROM, decreased balance, and decreased gait speed. Based on his 5xSTS score of 41.28 sec, gait speed of 0.95 ft/sec, and TUG score of 40 sec, pt is an increased risk for falls. Pt would benefit from skilled PT to address these impairments and functional limitations to maximize functional mobility independence.   OBJECTIVE IMPAIRMENTS: Abnormal gait, decreased balance, decreased endurance, decreased mobility, difficulty walking, decreased ROM, decreased strength, impaired UE functional use, and pain.   ACTIVITY LIMITATIONS: carrying, lifting, bending, standing, stairs, and transfers  PARTICIPATION LIMITATIONS: community  activity and church  PERSONAL FACTORS: Time since onset of injury/illness/exacerbation and 1-2 comorbidities: CVA  are also affecting patient's functional outcome.   REHAB POTENTIAL: Fair time since onset of CVA (2006)  CLINICAL DECISION MAKING: Stable/uncomplicated  EVALUATION COMPLEXITY: Low  PLAN:  PT FREQUENCY: 1x/week  PT DURATION: 8 weeks  PLANNED INTERVENTIONS: Therapeutic exercises, Therapeutic activity, Neuromuscular re-education, Balance training, Gait training, Patient/Family education, Self Care, Joint mobilization, Stair training, Vestibular training, Canalith repositioning, Visual/preceptual remediation/compensation, Orthotic/Fit training, DME instructions, Aquatic Therapy, Dry Needling, Electrical stimulation, Cryotherapy, Moist heat, Taping, Manual therapy, and Re-evaluation  PLAN FOR NEXT SESSION: assess gait with AFOs vs KAFOs vs Swedish knee cage?, initiate HEP for LLE strengthening (hip flex, quad strength) and stretching, did pt get OT referral?   Excell Seltzer, PT, DPT, CSRS 07/15/2022, 1:16 PM

## 2022-07-16 ENCOUNTER — Telehealth: Payer: Self-pay | Admitting: Physical Therapy

## 2022-07-16 DIAGNOSIS — G8110 Spastic hemiplegia affecting unspecified side: Secondary | ICD-10-CM

## 2022-07-16 NOTE — Telephone Encounter (Signed)
Dr. Heber English, [name] was evaluated by Physical Therapy on 07/15/2022.  The patient would benefit from an Occupational Therapy evaluation for decreased strength and ROM in his LUE.   If you agree, please place an order in [facility] workque in [facility] or fax the order to [phone]. Thank you, Excell Seltzer, PT, DPT, [facility] [address] [place], [place]  [address] Phone:  [phone] Fax:  [phone]

## 2022-07-18 NOTE — Telephone Encounter (Signed)
Thank you for the update. I have placed the referral for OT.

## 2022-07-19 ENCOUNTER — Encounter (HOSPITAL_COMMUNITY): Payer: Self-pay

## 2022-07-19 ENCOUNTER — Ambulatory Visit (HOSPITAL_COMMUNITY)
Admission: RE | Admit: 2022-07-19 | Discharge: 2022-07-19 | Disposition: A | Discharge status: Home / Self Care | Payer: Medicare HMO | Source: Ambulatory Visit | Attending: Internal Medicine | Admitting: Internal Medicine

## 2022-07-19 DIAGNOSIS — J439 Emphysema, unspecified: Secondary | ICD-10-CM | POA: Diagnosis not present

## 2022-07-19 DIAGNOSIS — Z87891 Personal history of nicotine dependence: Secondary | ICD-10-CM | POA: Diagnosis not present

## 2022-07-19 DIAGNOSIS — I700 Atherosclerosis of aorta: Secondary | ICD-10-CM | POA: Diagnosis not present

## 2022-07-19 DIAGNOSIS — Z122 Encounter for screening for malignant neoplasm of respiratory organs: Secondary | ICD-10-CM | POA: Diagnosis not present

## 2022-07-19 DIAGNOSIS — I2510 Atherosclerotic heart disease of native coronary artery without angina pectoris: Secondary | ICD-10-CM | POA: Insufficient documentation

## 2022-07-25 ENCOUNTER — Telehealth: Payer: Self-pay | Admitting: Internal Medicine

## 2022-07-25 NOTE — Telephone Encounter (Signed)
Opened in error

## 2022-07-29 ENCOUNTER — Ambulatory Visit: Payer: Medicare HMO | Admitting: Physical Therapy

## 2022-08-05 ENCOUNTER — Ambulatory Visit: Payer: Medicare HMO | Attending: Internal Medicine | Admitting: Physical Therapy

## 2022-08-05 DIAGNOSIS — R2681 Unsteadiness on feet: Secondary | ICD-10-CM | POA: Diagnosis not present

## 2022-08-05 DIAGNOSIS — M79642 Pain in left hand: Secondary | ICD-10-CM | POA: Diagnosis not present

## 2022-08-05 DIAGNOSIS — G8110 Spastic hemiplegia affecting unspecified side: Secondary | ICD-10-CM | POA: Diagnosis not present

## 2022-08-05 DIAGNOSIS — M6281 Muscle weakness (generalized): Secondary | ICD-10-CM | POA: Insufficient documentation

## 2022-08-05 DIAGNOSIS — R2689 Other abnormalities of gait and mobility: Secondary | ICD-10-CM | POA: Insufficient documentation

## 2022-08-05 NOTE — Therapy (Signed)
OUTPATIENT PHYSICAL THERAPY NEURO TREATMENT   Patient Name: MARGARITA JAVED MRN: [id number] DOB:1956/05/28, 67 y.o., male Today's Date: 08/05/2022   PCP: [name], DO REFERRING PROVIDER: [name], DO  END OF SESSION:  PT End of Session - 08/05/22 1106     Visit Number 2    Number of Visits 9   with eval   Date for PT Re-Evaluation 09/23/22   to allow for scheduling delays   Authorization Type [employer]    PT Start Time 1102    PT Stop Time 1142    PT Time Calculation (min) 40 min    Activity Tolerance Patient tolerated treatment well    Behavior During Therapy [facility] for tasks assessed/performed              Past Medical History:  Diagnosis Date   Aortic atherosclerosis ([place]) 10/23/2017   Asymptomatic, seen on CT scan   Bilateral cataracts 05/18/2016   Not visually significant   Cancer ([place])    maxillary   Cerebrovascular accident (CVA) ([place]) 03/27/2020   Chronic constipation 06/12/2013   Chronic low back pain 06/12/2013   L4-5 facet arthropathy    Chronic pain of both shoulders 12/07/2013   A/C joint osteoarthritis    Cluster headaches    Resolved in 2006   COPD (chronic obstructive pulmonary disease) ([place])    Dry eye syndrome, bilateral [id number]   Embolic stroke involving right middle cerebral artery ([place]) 08/08/2004   Occured after traveling from [place] to [place] where he was hospitalized for 1 month with no further work-up or therapy.  Returned to [place] unable to walk and was admitted to [facility] immediately after landing. Presumed embolic per neuro but TEE, bubble study, and hypercoag W/U negative. On indefinite coumadin per patient's informed preference.  Residual effects : Left spastic hemiplegia. Tx with phenol tibi   Generalized anxiety disorder 08/03/2018   History of kidney stones    Hyperplastic colon polyp 07/27/2012   Excised endoscopically 07/27/2012   Hypertensive retinopathy of both eyes 05/18/2016   Internal and external hemorrhoids without  complication 123XX123   Seen on colonoscopy 04/03/2007.   Left nephrolithiasis 08/31/2015   With mild left hydronephrosis.  Scheduled to undergo shockwave lithotripsy.   Obesity (BMI 30.0-34.9) 12/07/2013   Osteoarthritis of both knees 01/26/2012   Positive PPD 12/07/2013   Pulmonary embolism ([place]) 09/30/2004   Diagnosed in April 2006 after returning from [place].  Likely provoked as it occurred after a 12 hour plane flight within 2 months of R MCA CVA with left hemiparesis. Patient has made an informed decision to remain on lifelong warfarin.    Tubular adenoma 12/19/2017   Endoscopically excised 04/03/2007.   Urge incontinence of urine 08/31/2015   Possibly secondary to prior CVA.  Treated with Myrbetriq.   Past Surgical History:  Procedure Laterality Date   CYSTOSCOPY W/ RETROGRADES Right 02/22/2021   Procedure: CYSTOSCOPY WITH RETROGRADE PYELOGRAM/ URETEROSCOPY/ POSSIBLE BIOPSY OF LESION, POSSIBLE RIGHT STENT PLACEMENT;  Surgeon: Janith Lima, MD;  Location: [facility];  Service: Urology;  Laterality: Right;  ONLY NEEDS 30 MIN   EXCISION NASAL MASS Left 10/06/2017   Procedure: EXCISION LEFT NASAL MASS;  Surgeon: [name], MD;  Location: [place];  Service: ENT;  Laterality: Left;  Removal of intvering papilloma frozen section   FOOT SURGERY Bilateral    Bunion   I & D EXTREMITY Left 11/28/2001   Left thumb thenar space abscess.   LITHOTRIPSY  2017   MAXILLECTOMY  Left 11/22/2017   [name] 11/22/2017   MAXILLECTOMY Left 11/22/2017   Procedure: MAXILLECTOMY;  Surgeon: [name], MD;  Location: [place];  Service: ENT;  Laterality: Left;   NASAL SINUS SURGERY Left 10/06/2017   Procedure: ENDOSCOPIC SINUS SURGERY;  Surgeon: [name], MD;  Location: [place];  Service: ENT;  Laterality: Left;  Left side Endoscopic Macillary and Ethmoid Sinus   SKIN SPLIT GRAFT Left 11/22/2017   Procedure: SKIN GRAFT SPLIT THICKNESS;  Surgeon: [name], MD;  Location: [place];  Service: ENT;  Laterality:  Left;   Patient Active Problem List   Diagnosis Date Noted   Bright red rectal bleeding 12/03/2020   Left hip pain 10/15/2020   Prediabetes 10/15/2020   Chest pain 05/07/2020   Tremor 03/27/2020   Mild cognitive impairment 03/27/2020   Action tremor 01/10/2020   Memory changes 01/10/2020   Primary osteoarthritis of both hips 10/20/2019   Serum calcium elevated 01/22/2019   Epidermal inclusion cyst 08/08/2018   Generalized anxiety disorder 08/03/2018   Elevated blood pressure reading in office without diagnosis of hypertension 08/03/2018   Acute left-sided low back pain with left-sided sciatica 07/02/2018   Tubular adenoma 12/19/2017   Internal and external hemorrhoids without complication [id number]   Aortic atherosclerosis ([place]) 10/23/2017   Erectile dysfunction due to arterial insufficiency 10/21/2016   Bilateral cataracts 05/18/2016   Hypertensive retinopathy of both eyes 05/18/2016   Dry eye syndrome, bilateral 05/18/2016   Left nephrolithiasis 08/31/2015   Urge incontinence of urine 08/31/2015   Onychomycosis of left great toe 03/19/2015   Chronic pain of both shoulders 12/07/2013   Positive PPD 12/07/2013   Obesity (BMI 30.0-34.9) 12/07/2013   Chronic low back pain 06/12/2013   Chronic constipation 06/12/2013   Healthcare maintenance 03/21/2012   Spastic hemiplegia affecting nondominant side ([place]) 01/26/2012   Osteoarthritis of both knees 01/26/2012   Hyperlipidemia 11/16/2006   History of venous thromboembolism 09/30/2004   History of stroke with residual deficit 08/08/2004    ONSET DATE: 07/04/2022  REFERRING DIAG: G81.10 (ICD-10-CM) - Spastic hemiplegia affecting nondominant side (HCC)  THERAPY DIAG:  Spastic hemiplegia affecting nondominant side (HCC)  Muscle weakness (generalized)  Other abnormalities of gait and mobility  Unsteadiness on feet  Rationale for Evaluation and Treatment: Rehabilitation  SUBJECTIVE:                                                                                                                                                                                              SUBJECTIVE STATEMENT: Pt reports no falls or acute changes since last session. Pt with ongoing pain in posterior region  of L knee due to significant hyperextension during gait.   Pt accompanied by: self  PERTINENT HISTORY: per PCP visit 05/05/2022: NORMON RADFORD is a 67 y.o. male who presents today for his Annual Wellness Visit. He reports consuming a general and african carbohydrate rich  diet. The patient does not participate in regular exercise at present. Concerned about knee  He generally feels fairly well. He reports sleeping well. He does have additional problems to discuss today.    He is requesting a brace or referral to [facility].  He has a history of CVA and results suspect cystic hemiplegia of his left foot for which he wears a AFO brace but that has not been resized in some time.  PAIN:  Are you having pain? Yes: NPRS scale: not rated/10 Pain location: back of knee Pain description: soreness Aggravating factors: walking Relieving factors: rest  PRECAUTIONS: Fall  WEIGHT BEARING RESTRICTIONS: No  FALLS: Has patient fallen in last 6 months? No and frequent LOB but no falls  LIVING ENVIRONMENT: Lives with: lives alone Lives in: House/apartment Stairs: Yes: External: 2 steps; on right going up Has following equipment at home: Single point cane and shower chair  PLOF: Independent with gait, Independent with transfers, and Requires assistive device for independence  PATIENT GOALS: "I want to work on the overextension of my L knee"  OBJECTIVE:   DIAGNOSTIC FINDINGS:  Brain MRI 04/14/20 IMPRESSION:    MRI brain (without) demonstrating: - Chronic ischemic infarct in the right parietal and peri-insular region. - Moderate chronic small vessel ischemic disease. - No acute findings.   TODAY'S TREATMENT:                                                                                                                               ORTHOTIC ASSESSMENT: Assessed control of L knee hyperextension in standing and with short distance gait with use of Swedish knee cage this date. Pt initially unable to maintain brace in locked position on limb in standing due to severity of correction from brace. After adjustment of brace to 2nd setting from the front pt exhibits ability to maintain positioning of brace in stance. Pt does exhibit improved control of L knee hyperextension with use of Swedish knee cage this date as compared to use of his current KAFO. Pt to bring his custom AFO next session to trial along with Swedish knee cage to determine best bracing option for patient. Reviewed process of obtaining a new brace but will further assess options before scheduling with orthotist.  PATIENT EDUCATION: Education details: Swedish knee cage, process for obtaining a new brace Person educated: Patient Education method: Customer service manager Education comprehension: verbalized understanding, returned demonstration, and needs further education  HOME EXERCISE PROGRAM: To be initiated next session  GOALS: Goals reviewed with patient? Yes  SHORT TERM GOALS: Target date: 08/12/2022  Pt will be independent with initial HEP for improved strength, balance, transfers and gait. Baseline:  Goal status: INITIAL  2.  Pt to trial various bracing options to determine most appropriate orthotic to assist with LLE control during gait Baseline: KAFO (1/19) Goal status: INITIAL  3.  Pt will improve 5 x STS to less than or equal to 38 seconds to demonstrate improved functional strength and transfer efficiency.  Baseline: 41.28 sec (1/19) Goal status: INITIAL  4.  Pt will improve gait velocity to at least 1.25 ft/sec for improved gait efficiency and performance at mod I level  Baseline: 0.95 ft/sec with SPC (1/19) Goal status:  INITIAL  5.  Pt will improve normal TUG to less than or equal to 35 seconds for improved functional mobility and decreased fall risk. Baseline: 40 sec with SPC (1/19) Goal status: INITIAL   LONG TERM GOALS: Target date: 09/09/2022   Pt will be independent with final HEP for improved strength, balance, transfers and gait. Baseline:  Goal status: INITIAL  2.  Pt will improve 5 x STS to less than or equal to 35 seconds to demonstrate improved functional strength and transfer efficiency.  Baseline: 41.28 sec (1/19) Goal status: INITIAL  3.  Pt will improve gait velocity to at least 1.5 ft/sec for improved gait efficiency and performance at mod I level  Baseline: 0.95 ft/sec with SPC (1/19) Goal status: INITIAL  4.  Pt will improve normal TUG to less than or equal to 30 seconds for improved functional mobility and decreased fall risk. Baseline: 40 sec with SPC (1/19) Goal status: INITIAL  5.  Pt will improve FOTO score to at least 54% to demonstrate improved function. Baseline: 52% (1/19) Goal status: INITIAL   ASSESSMENT:  CLINICAL IMPRESSION: Emphasis of skilled PT session on assessing control of L knee hyperextension with use of Swedish knee cage this date. Pt does exhibit improved control with use of this brace this date. Pt to bring his custom L AFO next session to further assess his gait with both of these braces to compare to with his KAFO. Also plan to initiate HEP for LLE strengthening next session. Continue POC.    OBJECTIVE IMPAIRMENTS: Abnormal gait, decreased balance, decreased endurance, decreased mobility, difficulty walking, decreased ROM, decreased strength, impaired UE functional use, and pain.   ACTIVITY LIMITATIONS: carrying, lifting, bending, standing, stairs, and transfers  PARTICIPATION LIMITATIONS: community activity and church  PERSONAL FACTORS: Time since onset of injury/illness/exacerbation and 1-2 comorbidities: CVA  are also affecting patient's  functional outcome.   REHAB POTENTIAL: Fair time since onset of CVA (2006)  CLINICAL DECISION MAKING: Stable/uncomplicated  EVALUATION COMPLEXITY: Low  PLAN:  PT FREQUENCY: 1x/week  PT DURATION: 8 weeks  PLANNED INTERVENTIONS: Therapeutic exercises, Therapeutic activity, Neuromuscular re-education, Balance training, Gait training, Patient/Family education, Self Care, Joint mobilization, Stair training, Vestibular training, Canalith repositioning, Visual/preceptual remediation/compensation, Orthotic/Fit training, DME instructions, Aquatic Therapy, Dry Needling, Electrical stimulation, Cryotherapy, Moist heat, Taping, Manual therapy, and Re-evaluation  PLAN FOR NEXT SESSION: assess gait with pt's custom AFO and Swedish knee cage, initiate HEP for LLE strengthening (hip flex, quad strength, squats? TKE?) and stretching, assess STG   Excell Seltzer, PT, DPT, CSRS 08/05/2022, 11:45 AM

## 2022-08-08 ENCOUNTER — Ambulatory Visit: Payer: Medicare HMO

## 2022-08-12 ENCOUNTER — Ambulatory Visit: Payer: Medicare HMO | Admitting: Physical Therapy

## 2022-08-12 DIAGNOSIS — G8110 Spastic hemiplegia affecting unspecified side: Secondary | ICD-10-CM

## 2022-08-12 DIAGNOSIS — R2689 Other abnormalities of gait and mobility: Secondary | ICD-10-CM

## 2022-08-12 DIAGNOSIS — M79642 Pain in left hand: Secondary | ICD-10-CM | POA: Diagnosis not present

## 2022-08-12 DIAGNOSIS — R2681 Unsteadiness on feet: Secondary | ICD-10-CM | POA: Diagnosis not present

## 2022-08-12 DIAGNOSIS — M6281 Muscle weakness (generalized): Secondary | ICD-10-CM | POA: Diagnosis not present

## 2022-08-12 NOTE — Therapy (Signed)
OUTPATIENT PHYSICAL THERAPY NEURO TREATMENT   Patient Name: Alexander English MRN: [id number] DOB:12/15/1955, 67 y.o., male Today's Date: 08/12/2022   PCP: [name], DO REFERRING PROVIDER: [name], DO  END OF SESSION:  PT End of Session - 08/12/22 1111     Visit Number 3    Number of Visits 9   with eval   Date for PT Re-Evaluation 09/23/22   to allow for scheduling delays   Authorization Type [employer]    PT Start Time 1110   pt had to run out to his car after arrival   PT Stop Time 1146    PT Time Calculation (min) 36 min    Equipment Utilized During Treatment Other (comment)   Swedish knee cage   Activity Tolerance Patient tolerated treatment well    Behavior During Therapy [facility] for tasks assessed/performed               Past Medical History:  Diagnosis Date   Aortic atherosclerosis ([place]) 10/23/2017   Asymptomatic, seen on CT scan   Bilateral cataracts 05/18/2016   Not visually significant   Cancer ([place])    maxillary   Cerebrovascular accident (CVA) ([place]) 03/27/2020   Chronic constipation 06/12/2013   Chronic low back pain 06/12/2013   L4-5 facet arthropathy    Chronic pain of both shoulders 12/07/2013   A/C joint osteoarthritis    Cluster headaches    Resolved in 2006   COPD (chronic obstructive pulmonary disease) ([place])    Dry eye syndrome, bilateral [id number]   Embolic stroke involving right middle cerebral artery ([place]) 08/08/2004   Occured after traveling from [place] to [place] where he was hospitalized for 1 month with no further work-up or therapy.  Returned to [place] unable to walk and was admitted to [facility] immediately after landing. Presumed embolic per neuro but TEE, bubble study, and hypercoag W/U negative. On indefinite coumadin per patient's informed preference.  Residual effects : Left spastic hemiplegia. Tx with phenol tibi   Generalized anxiety disorder 08/03/2018   History of kidney stones    Hyperplastic colon polyp 07/27/2012   Excised  endoscopically 07/27/2012   Hypertensive retinopathy of both eyes 05/18/2016   Internal and external hemorrhoids without complication [id number]   Seen on colonoscopy 04/03/2007.   Left nephrolithiasis 08/31/2015   With mild left hydronephrosis.  Scheduled to undergo shockwave lithotripsy.   Obesity (BMI 30.0-34.9) 12/07/2013   Osteoarthritis of both knees 01/26/2012   Positive PPD 12/07/2013   Pulmonary embolism ([place]) 09/30/2004   Diagnosed in April 2006 after returning from [place].  Likely provoked as it occurred after a 12 hour plane flight within 2 months of R MCA CVA with left hemiparesis. Patient has made an informed decision to remain on lifelong warfarin.    Tubular adenoma 12/19/2017   Endoscopically excised 04/03/2007.   Urge incontinence of urine 08/31/2015   Possibly secondary to prior CVA.  Treated with Myrbetriq.   Past Surgical History:  Procedure Laterality Date   CYSTOSCOPY W/ RETROGRADES Right 02/22/2021   Procedure: CYSTOSCOPY WITH RETROGRADE PYELOGRAM/ URETEROSCOPY/ POSSIBLE BIOPSY OF LESION, POSSIBLE RIGHT STENT PLACEMENT;  Surgeon: Janith Lima, MD;  Location: [facility];  Service: Urology;  Laterality: Right;  ONLY NEEDS 30 MIN   EXCISION NASAL MASS Left 10/06/2017   Procedure: EXCISION LEFT NASAL MASS;  Surgeon: [name], MD;  Location: [place];  Service: ENT;  Laterality: Left;  Removal of intvering papilloma frozen section   FOOT SURGERY Bilateral  Bunion   I & D EXTREMITY Left 11/28/2001   Left thumb thenar space abscess.   LITHOTRIPSY  2017   MAXILLECTOMY Left 11/22/2017   [name] 11/22/2017   MAXILLECTOMY Left 11/22/2017   Procedure: MAXILLECTOMY;  Surgeon: [name], MD;  Location: [place];  Service: ENT;  Laterality: Left;   NASAL SINUS SURGERY Left 10/06/2017   Procedure: ENDOSCOPIC SINUS SURGERY;  Surgeon: [name], MD;  Location: [place];  Service: ENT;  Laterality: Left;  Left side Endoscopic Macillary and Ethmoid Sinus   SKIN SPLIT GRAFT Left  11/22/2017   Procedure: SKIN GRAFT SPLIT THICKNESS;  Surgeon: [name], MD;  Location: [place];  Service: ENT;  Laterality: Left;   Patient Active Problem List   Diagnosis Date Noted   Bright red rectal bleeding 12/03/2020   Left hip pain 10/15/2020   Prediabetes 10/15/2020   Chest pain 05/07/2020   Tremor 03/27/2020   Mild cognitive impairment 03/27/2020   Action tremor 01/10/2020   Memory changes 01/10/2020   Primary osteoarthritis of both hips 10/20/2019   Serum calcium elevated 01/22/2019   Epidermal inclusion cyst 08/08/2018   Generalized anxiety disorder 08/03/2018   Elevated blood pressure reading in office without diagnosis of hypertension 08/03/2018   Acute left-sided low back pain with left-sided sciatica 07/02/2018   Tubular adenoma 12/19/2017   Internal and external hemorrhoids without complication [id number]   Aortic atherosclerosis ([place]) 10/23/2017   Erectile dysfunction due to arterial insufficiency 10/21/2016   Bilateral cataracts 05/18/2016   Hypertensive retinopathy of both eyes 05/18/2016   Dry eye syndrome, bilateral 05/18/2016   Left nephrolithiasis 08/31/2015   Urge incontinence of urine 08/31/2015   Onychomycosis of left great toe 03/19/2015   Chronic pain of both shoulders 12/07/2013   Positive PPD 12/07/2013   Obesity (BMI 30.0-34.9) 12/07/2013   Chronic low back pain 06/12/2013   Chronic constipation 06/12/2013   Healthcare maintenance 03/21/2012   Spastic hemiplegia affecting nondominant side ([place]) 01/26/2012   Osteoarthritis of both knees 01/26/2012   Hyperlipidemia 11/16/2006   History of venous thromboembolism 09/30/2004   History of stroke with residual deficit 08/08/2004    ONSET DATE: 07/04/2022  REFERRING DIAG: G81.10 (ICD-10-CM) - Spastic hemiplegia affecting nondominant side (HCC)  THERAPY DIAG:  Spastic hemiplegia affecting nondominant side (HCC)  Other abnormalities of gait and mobility  Unsteadiness on feet  Muscle weakness  (generalized)  Rationale for Evaluation and Treatment: Rehabilitation  SUBJECTIVE:  SUBJECTIVE STATEMENT: Pt with no acute changes since last visit, no falls. Pt with ongoing pain in posterior region of L knee due to ongoing hyperextension. Pt brings in his AFO from home today.  Pt accompanied by: self  PERTINENT HISTORY: per PCP visit 05/05/2022: SANDOR TAORMINA is a 67 y.o. male who presents today for his Annual Wellness Visit. He reports consuming a general and african carbohydrate rich  diet. The patient does not participate in regular exercise at present. Concerned about knee  He generally feels fairly well. He reports sleeping well. He does have additional problems to discuss today.    He is requesting a brace or referral to [facility].  He has a history of CVA and results suspect cystic hemiplegia of his left foot for which he wears a AFO brace but that has not been resized in some time.  PAIN:  Are you having pain? Yes: NPRS scale: not rated/10 Pain location: back of knee Pain description: soreness Aggravating factors: walking Relieving factors: rest  PRECAUTIONS: Fall  WEIGHT BEARING RESTRICTIONS: No  FALLS: Has patient fallen in last 6 months? No and frequent LOB but no falls  LIVING ENVIRONMENT: Lives with: lives alone Lives in: House/apartment Stairs: Yes: External: 2 steps; on right going up Has following equipment at home: Single point cane and shower chair  PLOF: Independent with gait, Independent with transfers, and Requires assistive device for independence  PATIENT GOALS: "I want to work on the overextension of my L knee"  OBJECTIVE:   DIAGNOSTIC FINDINGS:  Brain MRI 04/14/20 IMPRESSION:    MRI brain (without) demonstrating: - Chronic ischemic infarct in the  right parietal and peri-insular region. - Moderate chronic small vessel ischemic disease. - No acute findings.   TODAY'S TREATMENT:                                                                                                                              ORTHOTIC ASSESSMENT: Assessed control of L knee hyperextension in standing and with short distance gait with use of Swedish knee cage this date along with pt's AFO. Pt does exhibit improved control of L knee hyperextension with use of Swedish knee cage this date as compared to use of his current KAFO. After discussion with orthotist he is recommending a block being placed on KAFO to prevent hyperextension, will reach out to referring physician for order.  THER EX: Seated LAQ 3 x 10 reps Standing TKE with red theraband 3 x 10 reps Problem-solved how pt can setup exercise band at home to perform exercise independently.  Added to HEP, see bolded below  PATIENT EDUCATION: Education details: initiated HEP, process for obtaining a new brace Person educated: Patient Education method: Explanation, Demonstration, and Handouts Education comprehension: verbalized understanding, returned demonstration, and needs further education  HOME EXERCISE PROGRAM: Access Code: [email or web address] URL: [email or web address] Date: 08/12/2022 Prepared by: Excell Seltzer  Exercises - Seated Long Arc Quad  - 1 x daily -  7 x weekly - 3 sets - 10 reps - 5 sec hold - Standing Terminal Knee Extension with Resistance  - 1 x daily - 7 x weekly - 3 sets - 10 reps  GOALS: Goals reviewed with patient? Yes  SHORT TERM GOALS: Target date: 08/12/2022  Pt will be independent with initial HEP for improved strength, balance, transfers and gait. Baseline: Goal status: INITIAL  2.  Pt to trial various bracing options to determine most appropriate orthotic to assist with LLE control during gait Baseline: KAFO (1/19) Goal status: INITIAL  3.  Pt will improve 5 x  STS to less than or equal to 38 seconds to demonstrate improved functional strength and transfer efficiency.  Baseline: 41.28 sec (1/19) Goal status: INITIAL  4.  Pt will improve gait velocity to at least 1.25 ft/sec for improved gait efficiency and performance at mod I level  Baseline: 0.95 ft/sec with SPC (1/19) Goal status: INITIAL  5.  Pt will improve normal TUG to less than or equal to 35 seconds for improved functional mobility and decreased fall risk. Baseline: 40 sec with SPC (1/19) Goal status: INITIAL   LONG TERM GOALS: Target date: 09/09/2022   Pt will be independent with final HEP for improved strength, balance, transfers and gait. Baseline:  Goal status: INITIAL  2.  Pt will improve 5 x STS to less than or equal to 35 seconds to demonstrate improved functional strength and transfer efficiency.  Baseline: 41.28 sec (1/19) Goal status: INITIAL  3.  Pt will improve gait velocity to at least 1.5 ft/sec for improved gait efficiency and performance at mod I level  Baseline: 0.95 ft/sec with SPC (1/19) Goal status: INITIAL  4.  Pt will improve normal TUG to less than or equal to 30 seconds for improved functional mobility and decreased fall risk. Baseline: 40 sec with SPC (1/19) Goal status: INITIAL  5.  Pt will improve FOTO score to at least 54% to demonstrate improved function. Baseline: 52% (1/19) Goal status: INITIAL   ASSESSMENT:  CLINICAL IMPRESSION: Emphasis of skilled PT session on trialing gait again with Swedish knee cage with pt's current AFO. Pt exhibits improved knee control and limb clearance during gait with this combination. After discussion with orthotist pt would benefit from a KAFO with block in place to simulate Swedish knee cage. Also initiated HEP for L quad strengthening, will assess performance of HEP next session. Pt continues to benefit from skilled therapy services to address ongoing L hemibody weakness and continue to work towards obtaining a  new brace for improved L knee control during gait. Continue POC.    OBJECTIVE IMPAIRMENTS: Abnormal gait, decreased balance, decreased endurance, decreased mobility, difficulty walking, decreased ROM, decreased strength, impaired UE functional use, and pain.   ACTIVITY LIMITATIONS: carrying, lifting, bending, standing, stairs, and transfers  PARTICIPATION LIMITATIONS: community activity and church  PERSONAL FACTORS: Time since onset of injury/illness/exacerbation and 1-2 comorbidities: CVA  are also affecting patient's functional outcome.   REHAB POTENTIAL: Fair time since onset of CVA (2006)  CLINICAL DECISION MAKING: Stable/uncomplicated  EVALUATION COMPLEXITY: Low  PLAN:  PT FREQUENCY: 1x/week  PT DURATION: 8 weeks  PLANNED INTERVENTIONS: Therapeutic exercises, Therapeutic activity, Neuromuscular re-education, Balance training, Gait training, Patient/Family education, Self Care, Joint mobilization, Stair training, Vestibular training, Canalith repositioning, Visual/preceptual remediation/compensation, Orthotic/Fit training, DME instructions, Aquatic Therapy, Dry Needling, Electrical stimulation, Cryotherapy, Moist heat, Taping, Manual therapy, and Re-evaluation  PLAN FOR NEXT SESSION: ASSESS STG, how is HEP?, add to HEP for LLE  strengthening (hip flex, quad strength, squats? ) and stretching, did we get order for new KAFO?   Excell Seltzer, PT, DPT, CSRS 08/12/2022, 11:47 AM

## 2022-08-15 ENCOUNTER — Ambulatory Visit (INDEPENDENT_AMBULATORY_CARE_PROVIDER_SITE_OTHER): Payer: Medicare HMO | Admitting: Pharmacist

## 2022-08-15 ENCOUNTER — Ambulatory Visit: Payer: Medicare HMO | Admitting: Physical Therapy

## 2022-08-15 ENCOUNTER — Encounter: Payer: Self-pay | Admitting: Occupational Therapy

## 2022-08-15 ENCOUNTER — Other Ambulatory Visit: Payer: Self-pay

## 2022-08-15 ENCOUNTER — Ambulatory Visit: Payer: Medicare HMO | Admitting: Occupational Therapy

## 2022-08-15 ENCOUNTER — Ambulatory Visit (INDEPENDENT_AMBULATORY_CARE_PROVIDER_SITE_OTHER): Payer: Medicare HMO | Admitting: Student

## 2022-08-15 ENCOUNTER — Encounter: Payer: Self-pay | Admitting: Student

## 2022-08-15 VITALS — BP 143/78 | HR 71 | Temp 98.2°F | Ht 72.0 in | Wt 233.7 lb

## 2022-08-15 DIAGNOSIS — Z86711 Personal history of pulmonary embolism: Secondary | ICD-10-CM | POA: Diagnosis not present

## 2022-08-15 DIAGNOSIS — M79642 Pain in left hand: Secondary | ICD-10-CM

## 2022-08-15 DIAGNOSIS — R2681 Unsteadiness on feet: Secondary | ICD-10-CM | POA: Diagnosis not present

## 2022-08-15 DIAGNOSIS — R2689 Other abnormalities of gait and mobility: Secondary | ICD-10-CM

## 2022-08-15 DIAGNOSIS — G8110 Spastic hemiplegia affecting unspecified side: Secondary | ICD-10-CM

## 2022-08-15 DIAGNOSIS — Z7901 Long term (current) use of anticoagulants: Secondary | ICD-10-CM | POA: Diagnosis not present

## 2022-08-15 DIAGNOSIS — N3941 Urge incontinence: Secondary | ICD-10-CM

## 2022-08-15 DIAGNOSIS — M6281 Muscle weakness (generalized): Secondary | ICD-10-CM

## 2022-08-15 DIAGNOSIS — Z86718 Personal history of other venous thrombosis and embolism: Secondary | ICD-10-CM | POA: Diagnosis not present

## 2022-08-15 DIAGNOSIS — M19042 Primary osteoarthritis, left hand: Secondary | ICD-10-CM

## 2022-08-15 DIAGNOSIS — I639 Cerebral infarction, unspecified: Secondary | ICD-10-CM | POA: Diagnosis not present

## 2022-08-15 DIAGNOSIS — E785 Hyperlipidemia, unspecified: Secondary | ICD-10-CM

## 2022-08-15 DIAGNOSIS — G252 Other specified forms of tremor: Secondary | ICD-10-CM

## 2022-08-15 DIAGNOSIS — M7989 Other specified soft tissue disorders: Secondary | ICD-10-CM | POA: Diagnosis not present

## 2022-08-15 LAB — POCT INR: INR: 3.3 — AB (ref 2.0–3.0)

## 2022-08-15 MED ORDER — PROPRANOLOL HCL ER 60 MG PO CP24: 60.0000 mg | ORAL_CAPSULE | Freq: Every day | ORAL | 3 refills | Status: DC

## 2022-08-15 MED ORDER — FESOTERODINE FUMARATE ER 8 MG PO TB24: 8.0000 mg | ORAL_TABLET | Freq: Every day | ORAL | 3 refills | Status: DC

## 2022-08-15 MED ORDER — ATORVASTATIN CALCIUM 10 MG PO TABS: 10.0000 mg | ORAL_TABLET | Freq: Every day | ORAL | 3 refills | Status: DC

## 2022-08-15 MED ORDER — DICLOFENAC SODIUM 1 % EX GEL: 2.0000 g | Freq: Four times a day (QID) | CUTANEOUS | 0 refills | Status: DC

## 2022-08-15 MED ORDER — MIRABEGRON ER 50 MG PO TB24: 50.0000 mg | ORAL_TABLET | Freq: Every day | ORAL | 2 refills | Status: DC

## 2022-08-15 NOTE — Assessment & Plan Note (Signed)
Alexander English is presenting to clinic today for acute left hand swelling. Reports three days ago he woke up and noticed his hand was swollen. Since that time he reports increased general aches in the hand, but denies any specific joint that is bothering him or time of day that the pain is worse. Also denies any inciting event or recent wound. Has tried Tylenol without much relief. He has been compliant with all of his medications, including warfarin.   On examination, the hand is diffusely swollen, no nodules or specific joints that are swollen. No erythema, warmth, or open wounds. Radial pulse 2+. Presentation is not consistent with gout, rheumatoid arthritis, cellulitis, clots, or systemic causes. He does have a significant history of osteoarthritis in other joints. Most likely this is osteoarthritis causing his symptoms. Therefore plan to give voltaren gel, have him return to clinic if symptoms do not improve.  - Voltaren gel as needed - Continue Tylenol twice daily - Return precautions given

## 2022-08-15 NOTE — Therapy (Signed)
OUTPATIENT OCCUPATIONAL THERAPY NEURO EVALUATION  Patient Name: Alexander English MRN: [id number] DOB:08/05/1955, 67 y.o., male Today's Date: 08/15/2022  PCP: [name], DO REFERRING PROVIDER: [name], DO  END OF SESSION:  OT End of Session - 08/15/22 1452     Visit Number 1    Number of Visits 11    Date for OT Re-Evaluation 10/28/22    Authorization Type [employer] - requires authorization    Progress Note Due on Visit 10    OT Start Time 1448    OT Stop Time 1529    OT Time Calculation (min) 41 min    Activity Tolerance Patient tolerated treatment well    Behavior During Therapy [facility] for tasks assessed/performed             Past Medical History:  Diagnosis Date   Aortic atherosclerosis ([place]) 10/23/2017   Asymptomatic, seen on CT scan   Bilateral cataracts 05/18/2016   Not visually significant   Cancer ([place])    maxillary   Cerebrovascular accident (CVA) ([place]) 03/27/2020   Chronic constipation 06/12/2013   Chronic low back pain 06/12/2013   L4-5 facet arthropathy    Chronic pain of both shoulders 12/07/2013   A/C joint osteoarthritis    Cluster headaches    Resolved in 2006   COPD (chronic obstructive pulmonary disease) ([employer])    Dry eye syndrome, bilateral [id number]   Embolic stroke involving right middle cerebral artery ([place]) 08/08/2004   Occured after traveling from [place] to [place] where he was hospitalized for 1 month with no further work-up or therapy.  Returned to [place] unable to walk and was admitted to [facility] immediately after landing. Presumed embolic per neuro but TEE, bubble study, and hypercoag W/U negative. On indefinite coumadin per patient's informed preference.  Residual effects : Left spastic hemiplegia. Tx with phenol tibi   Generalized anxiety disorder 08/03/2018   History of kidney stones    Hyperplastic colon polyp 07/27/2012   Excised endoscopically 07/27/2012   Hypertensive retinopathy of both eyes 05/18/2016   Internal and  external hemorrhoids without complication [id number]   Seen on colonoscopy 04/03/2007.   Left nephrolithiasis 08/31/2015   With mild left hydronephrosis.  Scheduled to undergo shockwave lithotripsy.   Obesity (BMI 30.0-34.9) 12/07/2013   Osteoarthritis of both knees 01/26/2012   Positive PPD 12/07/2013   Pulmonary embolism ([place]) 09/30/2004   Diagnosed in April 2006 after returning from [place].  Likely provoked as it occurred after a 12 hour plane flight within 2 months of R MCA CVA with left hemiparesis. Patient has made an informed decision to remain on lifelong warfarin.    Tubular adenoma 12/19/2017   Endoscopically excised 04/03/2007.   Urge incontinence of urine 08/31/2015   Possibly secondary to prior CVA.  Treated with Myrbetriq.   Past Surgical History:  Procedure Laterality Date   CYSTOSCOPY W/ RETROGRADES Right 02/22/2021   Procedure: CYSTOSCOPY WITH RETROGRADE PYELOGRAM/ URETEROSCOPY/ POSSIBLE BIOPSY OF LESION, POSSIBLE RIGHT STENT PLACEMENT;  Surgeon: Janith Lima, MD;  Location: [facility];  Service: Urology;  Laterality: Right;  ONLY NEEDS 30 MIN   EXCISION NASAL MASS Left 10/06/2017   Procedure: EXCISION LEFT NASAL MASS;  Surgeon: [name], MD;  Location: [place];  Service: ENT;  Laterality: Left;  Removal of intvering papilloma frozen section   FOOT SURGERY Bilateral    Bunion   I & D EXTREMITY Left 11/28/2001   Left thumb thenar space abscess.   LITHOTRIPSY  2017  MAXILLECTOMY Left 11/22/2017   [name] 11/22/2017   MAXILLECTOMY Left 11/22/2017   Procedure: MAXILLECTOMY;  Surgeon: [name], MD;  Location: [place];  Service: ENT;  Laterality: Left;   NASAL SINUS SURGERY Left 10/06/2017   Procedure: ENDOSCOPIC SINUS SURGERY;  Surgeon: [name], MD;  Location: [place];  Service: ENT;  Laterality: Left;  Left side Endoscopic Macillary and Ethmoid Sinus   SKIN SPLIT GRAFT Left 11/22/2017   Procedure: SKIN GRAFT SPLIT THICKNESS;  Surgeon: [name], MD;  Location: [place];   Service: ENT;  Laterality: Left;   Patient Active Problem List   Diagnosis Date Noted   Swelling of left hand 08/15/2022   Bright red rectal bleeding 12/03/2020   Left hip pain 10/15/2020   Prediabetes 10/15/2020   Chest pain 05/07/2020   Tremor 03/27/2020   Mild cognitive impairment 03/27/2020   Action tremor 01/10/2020   Memory changes 01/10/2020   Primary osteoarthritis of both hips 10/20/2019   Serum calcium elevated 01/22/2019   Epidermal inclusion cyst 08/08/2018   Generalized anxiety disorder 08/03/2018   Elevated blood pressure reading in office without diagnosis of hypertension 08/03/2018   Acute left-sided low back pain with left-sided sciatica 07/02/2018   Tubular adenoma 12/19/2017   Internal and external hemorrhoids without complication [id number]   Aortic atherosclerosis ([place]) 10/23/2017   Erectile dysfunction due to arterial insufficiency 10/21/2016   Bilateral cataracts 05/18/2016   Hypertensive retinopathy of both eyes 05/18/2016   Dry eye syndrome, bilateral 05/18/2016   Left nephrolithiasis 08/31/2015   Urge incontinence of urine 08/31/2015   Onychomycosis of left great toe 03/19/2015   Chronic pain of both shoulders 12/07/2013   Positive PPD 12/07/2013   Obesity (BMI 30.0-34.9) 12/07/2013   Chronic low back pain 06/12/2013   Chronic constipation 06/12/2013   Healthcare maintenance 03/21/2012   Spastic hemiplegia affecting nondominant side ([place]) 01/26/2012   Osteoarthritis of both knees 01/26/2012   Hyperlipidemia 11/16/2006   History of venous thromboembolism 09/30/2004   History of stroke with residual deficit 08/08/2004    ONSET DATE: 2006  REFERRING DIAG: G81.10 (ICD-10-CM) - Spastic hemiplegia affecting nondominant side  THERAPY DIAG:  Muscle weakness (generalized)  Spastic hemiplegia affecting nondominant side (HCC)  Pain in left hand  Rationale for Evaluation and Treatment: Rehabilitation  SUBJECTIVE:   SUBJECTIVE STATEMENT: He has a  brace from [employer] for his hand but has not worn it. He is unsure if he has had Botox or Baclofen in the past.   Pt accompanied by: self  PERTINENT HISTORY: per PCP visit 05/05/2022: "ICHIRO BAUGHER is a 67 y.o. male who presents today for his Annual Wellness Visit. He reports consuming a general and african carbohydrate rich  diet. The patient does not participate in regular exercise at present. Concerned about knee  He generally feels fairly well. He reports sleeping well. He does have additional problems to discuss today."   PRECAUTIONS: Fall  WEIGHT BEARING RESTRICTIONS: No  PAIN:  Are you having pain? Yes: NPRS scale: 2-3/10 Pain location: L hand Pain description: aching; swelling Aggravating factors: unsure Relieving factors: unsure  FALLS: Has patient fallen in last 6 months? No and lots of reported LOB  LIVING ENVIRONMENT: Lives with: lives alone Lives in: House/apartment Stairs: Yes: External: 2 steps; on right going up Has following equipment at home: Single point cane and shower chair  PLOF: Independent; managed a convenient store; worked at [employer]; Proofreader, distribution center; played soccer; swimming; learning  PATIENT GOALS: Improve pain and control  of LUE  OBJECTIVE:   HAND DOMINANCE: Right  ADLs: Overall ADLs: mod I Eating: requires assistance for cutting  IADLs: Shopping: independent Light housekeeping: requires assistance with vacuuming; cleaning bathroom Meal Prep: setup Community mobility: drives Medication management: independently Financial management: independently  MOBILITY STATUS:  SBA with use of cane  ACTIVITY TOLERANCE: Activity tolerance: fair  UPPER EXTREMITY ROM:     AROM Right (eval) Left (eval)  Shoulder flexion WNL Lacks AROM  Shoulder abduction WNL 80*  Elbow flexion WNL   Elbow extension WNL Lacks AROM  Wrist flexion WNL Lacks AROM  Wrist extension WNL Lacks AROM  Wrist pronation WNL Lacks AROM  Wrist supination  WNL Lacks AROM   Digit Composite Flexion WNL Lacks AROM  Digit Composite Extension WNL Lacks AROM  Digit Opposition WNL Lacks AROM  (Blank rows = not tested)  UPPER EXTREMITY MMT:     MMT Right (eval) Left (eval)  Shoulder flexion WNL BFL  Shoulder abduction WNL BFL  Elbow flexion WNL BFL  Elbow extension WNL BFL  (Blank rows = not tested)  HAND FUNCTION: Grip strength n/t due to lack of AROM  COORDINATION: Box and blocks not tested due to lack of AROM and tone  SENSATION: WFL though reports paresthesias in L hand  EDEMA: mild swelling L hand  MUSCLE TONE: LUE: moderate to severe spasticity throughout  COGNITION: Overall cognitive status: Within functional limits for tasks assessed  PERCEPTION: WFL  PRAXIS: WFL  OBSERVATIONS: Pt ambulates with use of cane and altered gait. No LOB. LUE in guarded positioning. Significant tone and flexor synergy.    TODAY'S TREATMENT:                                                                                                                               OT educated pt on edema management as noted in pt instructions.   PATIENT EDUCATION: Education details: OT POC; edema reduction Person educated: Patient Education method: Explanation, Demonstration, and Handouts Education comprehension: verbalized understanding, returned demonstration, and needs further education  HOME EXERCISE PROGRAM: Not yet initiated  GOALS:  SHORT TERM GOALS: Target date: 09/12/2022    Pt will independently recall L edema management.  Baseline: Goal status: INITIAL  2.  Patient will demonstrate initial LUE ROM HEP with 25% verbal cues or less for proper execution. Baseline:  Goal status: INITIAL   LONG TERM GOALS: Target date: 10/28/2022    Patient will demonstrate updated LUE HEP with 25% verbal cues or less for proper execution. Baseline:  Goal status: INITIAL  2.  Pt will be able to place at least 5 blocks using L hand with completion  of Box and Blocks test. Baseline: unable to place any blocks Goal status: INITIAL  3.  Pt will verbalize understanding of LUE splint wearing instructions. Baseline:  Goal status: INITIAL   ASSESSMENT:  CLINICAL IMPRESSION: Patient is a 67 y.o. male who was seen today for occupational therapy evaluation  for LUE spasticity. Hx includes PE, CVA (2006), OA, chronic low back pain, chronic B shoulder pain, retinopathy OU, GAD, tremor, mild cognitive impairment, L hip pain, and prediabetes. Patient currently presents demonstrating impairments as noted below. Pt would benefit from skilled OT services in the outpatient setting to work on impairments as noted below to help pt return to PLOF as able.    PERFORMANCE DEFICITS: in functional skills including ADLs, IADLs, coordination, edema, tone, ROM, strength, pain, flexibility, Fine motor control, Gross motor control, and UE functional use.   IMPAIRMENTS: are limiting patient from ADLs, IADLs, rest and sleep, and leisure.   CO-MORBIDITIES: may have co-morbidities  that affects occupational performance. Patient will benefit from skilled OT to address above impairments and improve overall function.  MODIFICATION OR ASSISTANCE TO COMPLETE EVALUATION: Min-Moderate modification of tasks or assist with assess necessary to complete an evaluation.  OT OCCUPATIONAL PROFILE AND HISTORY: Problem focused assessment: Including review of records relating to presenting problem.  CLINICAL DECISION MAKING: LOW - limited treatment options, no task modification necessary  REHAB POTENTIAL: Fair given chronicity of stroke  EVALUATION COMPLEXITY: Low    PLAN:  OT FREQUENCY: 1x/week  OT DURATION: 10 weeks  PLANNED INTERVENTIONS: self care/ADL training, therapeutic exercise, therapeutic activity, neuromuscular re-education, manual therapy, passive range of motion, functional mobility training, aquatic therapy, splinting, electrical stimulation, ultrasound,  fluidotherapy, moist heat, contrast bath, patient/family education, cognitive remediation/compensation, DME and/or AE instructions, Re-evaluation, and Dry needling  RECOMMENDED OTHER SERVICES: Debate appropriateness for Botox vs if pt is appropriate for Vivistim  CONSULTED AND AGREED WITH PLAN OF CARE: Patient  PLAN FOR NEXT SESSION: LUE HEP; Vivistim candidate?    Dennis Bast, OT 08/15/2022, 5:46 PM

## 2022-08-15 NOTE — Therapy (Signed)
OUTPATIENT PHYSICAL THERAPY NEURO TREATMENT   Patient Name: Alexander English MRN: [id number] DOB:Mar 04, 1956, 67 y.o., male Today's Date: 08/15/2022   PCP: [name], DO REFERRING PROVIDER: [name], DO  END OF SESSION:  PT End of Session - 08/15/22 1531     Visit Number 4    Number of Visits 9   with eval   Date for PT Re-Evaluation 09/23/22   to allow for scheduling delays   Authorization Type [employer]    PT Start Time 1530    PT Stop Time [id number]    PT Time Calculation (min) 45 min    Activity Tolerance Patient tolerated treatment well    Behavior During Therapy [facility] for tasks assessed/performed                Past Medical History:  Diagnosis Date   Aortic atherosclerosis ([place]) 10/23/2017   Asymptomatic, seen on CT scan   Bilateral cataracts 05/18/2016   Not visually significant   Cancer ([place])    maxillary   Cerebrovascular accident (CVA) ([place]) 03/27/2020   Chronic constipation 06/12/2013   Chronic low back pain 06/12/2013   L4-5 facet arthropathy    Chronic pain of both shoulders 12/07/2013   A/C joint osteoarthritis    Cluster headaches    Resolved in 2006   COPD (chronic obstructive pulmonary disease) ([place])    Dry eye syndrome, bilateral [id number]   Embolic stroke involving right middle cerebral artery ([place]) 08/08/2004   Occured after traveling from [place] to [place] where he was hospitalized for 1 month with no further work-up or therapy.  Returned to [place] unable to walk and was admitted to [facility] immediately after landing. Presumed embolic per neuro but TEE, bubble study, and hypercoag W/U negative. On indefinite coumadin per patient's informed preference.  Residual effects : Left spastic hemiplegia. Tx with phenol tibi   Generalized anxiety disorder 08/03/2018   History of kidney stones    Hyperplastic colon polyp 07/27/2012   Excised endoscopically 07/27/2012   Hypertensive retinopathy of both eyes 05/18/2016   Internal and external hemorrhoids  without complication [id number]   Seen on colonoscopy 04/03/2007.   Left nephrolithiasis 08/31/2015   With mild left hydronephrosis.  Scheduled to undergo shockwave lithotripsy.   Obesity (BMI 30.0-34.9) 12/07/2013   Osteoarthritis of both knees 01/26/2012   Positive PPD 12/07/2013   Pulmonary embolism ([place]) 09/30/2004   Diagnosed in April 2006 after returning from [place].  Likely provoked as it occurred after a 12 hour plane flight within 2 months of R MCA CVA with left hemiparesis. Patient has made an informed decision to remain on lifelong warfarin.    Tubular adenoma 12/19/2017   Endoscopically excised 04/03/2007.   Urge incontinence of urine 08/31/2015   Possibly secondary to prior CVA.  Treated with Myrbetriq.   Past Surgical History:  Procedure Laterality Date   CYSTOSCOPY W/ RETROGRADES Right 02/22/2021   Procedure: CYSTOSCOPY WITH RETROGRADE PYELOGRAM/ URETEROSCOPY/ POSSIBLE BIOPSY OF LESION, POSSIBLE RIGHT STENT PLACEMENT;  Surgeon: Janith Lima, MD;  Location: [facility];  Service: Urology;  Laterality: Right;  ONLY NEEDS 30 MIN   EXCISION NASAL MASS Left 10/06/2017   Procedure: EXCISION LEFT NASAL MASS;  Surgeon: [name], MD;  Location: [place];  Service: ENT;  Laterality: Left;  Removal of intvering papilloma frozen section   FOOT SURGERY Bilateral    Bunion   I & D EXTREMITY Left 11/28/2001   Left thumb thenar space abscess.   LITHOTRIPSY  2017  MAXILLECTOMY Left 11/22/2017   [name] 11/22/2017   MAXILLECTOMY Left 11/22/2017   Procedure: MAXILLECTOMY;  Surgeon: [name], MD;  Location: [place];  Service: ENT;  Laterality: Left;   NASAL SINUS SURGERY Left 10/06/2017   Procedure: ENDOSCOPIC SINUS SURGERY;  Surgeon: [name], MD;  Location: [place];  Service: ENT;  Laterality: Left;  Left side Endoscopic Macillary and Ethmoid Sinus   SKIN SPLIT GRAFT Left 11/22/2017   Procedure: SKIN GRAFT SPLIT THICKNESS;  Surgeon: [name], MD;  Location: [place];  Service: ENT;   Laterality: Left;   Patient Active Problem List   Diagnosis Date Noted   Swelling of left hand 08/15/2022   Bright red rectal bleeding 12/03/2020   Left hip pain 10/15/2020   Prediabetes 10/15/2020   Chest pain 05/07/2020   Tremor 03/27/2020   Mild cognitive impairment 03/27/2020   Action tremor 01/10/2020   Memory changes 01/10/2020   Primary osteoarthritis of both hips 10/20/2019   Serum calcium elevated 01/22/2019   Epidermal inclusion cyst 08/08/2018   Generalized anxiety disorder 08/03/2018   Elevated blood pressure reading in office without diagnosis of hypertension 08/03/2018   Acute left-sided low back pain with left-sided sciatica 07/02/2018   Tubular adenoma 12/19/2017   Internal and external hemorrhoids without complication [id number]   Aortic atherosclerosis ([place]) 10/23/2017   Erectile dysfunction due to arterial insufficiency 10/21/2016   Bilateral cataracts 05/18/2016   Hypertensive retinopathy of both eyes 05/18/2016   Dry eye syndrome, bilateral 05/18/2016   Left nephrolithiasis 08/31/2015   Urge incontinence of urine 08/31/2015   Onychomycosis of left great toe 03/19/2015   Chronic pain of both shoulders 12/07/2013   Positive PPD 12/07/2013   Obesity (BMI 30.0-34.9) 12/07/2013   Chronic low back pain 06/12/2013   Chronic constipation 06/12/2013   Healthcare maintenance 03/21/2012   Spastic hemiplegia affecting nondominant side ([place]) 01/26/2012   Osteoarthritis of both knees 01/26/2012   Hyperlipidemia 11/16/2006   History of venous thromboembolism 09/30/2004   History of stroke with residual deficit 08/08/2004    ONSET DATE: 07/04/2022  REFERRING DIAG: G81.10 (ICD-10-CM) - Spastic hemiplegia affecting nondominant side (HCC)  THERAPY DIAG:  Muscle weakness (generalized)  Spastic hemiplegia affecting nondominant side (HCC)  Unsteadiness on feet  Other abnormalities of gait and mobility  Rationale for Evaluation and Treatment:  Rehabilitation  SUBJECTIVE:                                                                                                                                                                                             SUBJECTIVE STATEMENT: Pt reports no acute changes since last week. Pt  heard from [facility] and got appointment scheduled for March, unsure of exact date. Pt was unable to do TKE exercise due to inability to find a place to anchor resistance band.  Pt accompanied by: self  PERTINENT HISTORY: per PCP visit 05/05/2022: AADON ROUTHIER is a 68 y.o. male who presents today for his Annual Wellness Visit. He reports consuming a general and african carbohydrate rich  diet. The patient does not participate in regular exercise at present. Concerned about knee  He generally feels fairly well. He reports sleeping well. He does have additional problems to discuss today.    He is requesting a brace or referral to [facility].  He has a history of CVA and results suspect cystic hemiplegia of his left foot for which he wears a AFO brace but that has not been resized in some time.  PAIN:  Are you having pain? Yes: NPRS scale: not rated/10 Pain location: back of knee Pain description: soreness Aggravating factors: walking Relieving factors: rest  PRECAUTIONS: Fall  WEIGHT BEARING RESTRICTIONS: No  FALLS: Has patient fallen in last 6 months? No and frequent LOB but no falls  LIVING ENVIRONMENT: Lives with: lives alone Lives in: House/apartment Stairs: Yes: External: 2 steps; on right going up Has following equipment at home: Single point cane and shower chair  PLOF: Independent with gait, Independent with transfers, and Requires assistive device for independence  PATIENT GOALS: "I want to work on the overextension of my L knee"  OBJECTIVE:   DIAGNOSTIC FINDINGS:  Brain MRI 04/14/20 IMPRESSION:    MRI brain (without) demonstrating: - Chronic ischemic infarct in the right  parietal and peri-insular region. - Moderate chronic small vessel ischemic disease. - No acute findings.   TODAY'S TREATMENT:                                                                                                                               THER ACT: Reassessed for STG assessment:  [facility] PT Assessment - 08/15/22 1547       Ambulation/Gait   Gait velocity 32.8 ft over 34 sec = 0.96 ft/sec      Standardized Balance Assessment   Standardized Balance Assessment Timed Up and Go Test;Five Times Sit to Stand    Five times sit to stand comments  32.97   pushing up with RUE from mat table     Timed Up and Go Test   TUG Normal TUG    Normal TUG (seconds) 37   with SPC           THER EX: In // bars: Attempted 2" step-taps with LLE, unable due to hip flexor weakness Standing RLE 2" step-taps with focus on "soft" L knee in stance for control without hyperextension Attempted 2" eccentric step down with LLE, knee buckles due to weakness  On mat table: Supine L heel slides (difficult) Supine L hip flexion (difficult) Sidelying L hip flexion (medium difficulty, decreased ROM)  Supine bridges x 10 reps  Added appropriate exercises to HEP, see bolded below  PATIENT EDUCATION: Education details: added to HEP Person educated: Patient Education method: Consulting civil engineer, Demonstration, and Handouts Education comprehension: verbalized understanding, returned demonstration, and needs further education  HOME EXERCISE PROGRAM: Access Code: [email or web address] URL: [email or web address] Date: 08/12/2022 Prepared by: Excell Seltzer  Exercises - Seated Long Arc Quad  - 1 x daily - 7 x weekly - 3 sets - 10 reps - 5 sec hold - Standing Terminal Knee Extension with Resistance  - 1 x daily - 7 x weekly - 3 sets - 10 reps - Standing Forward Step Taps with Counter Support  - 1 x daily - 7 x weekly - 3 sets - 10 reps - Supine Bridge  - 1 x daily - 7 x weekly - 3 sets - 10 reps - Supine  March  - 1 x daily - 7 x weekly - 3 sets - 10 reps - Sidelying Bent Knee Hip Flexion  - 1 x daily - 7 x weekly - 3 sets - 10 reps  GOALS: Goals reviewed with patient? Yes  SHORT TERM GOALS: Target date: 08/12/2022  Pt will be independent with initial HEP for improved strength, balance, transfers and gait. Baseline: Goal status: MET  2.  Pt to trial various bracing options to determine most appropriate orthotic to assist with LLE control during gait Baseline: KAFO (1/19), trialed Swedish knee cage with AFO (2/16) Goal status: MET  3.  Pt will improve 5 x STS to less than or equal to 38 seconds to demonstrate improved functional strength and transfer efficiency.  Baseline: 41.28 sec (1/19), 32.97 sec (2/19) Goal status: MET  4.  Pt will improve gait velocity to at least 1.25 ft/sec for improved gait efficiency and performance at mod I level  Baseline: 0.95 ft/sec with SPC (1/19), 0.96 ft/sec with SPC (2/19) Goal status: IN PROGRESS  5.  Pt will improve normal TUG to less than or equal to 35 seconds for improved functional mobility and decreased fall risk. Baseline: 40 sec with SPC (1/19), 37 sec with SPC (2/19) Goal status: IN PROGRESS   LONG TERM GOALS: Target date: 09/09/2022   Pt will be independent with final HEP for improved strength, balance, transfers and gait. Baseline:  Goal status: INITIAL  2.  Pt will improve 5 x STS to less than or equal to 30 seconds to demonstrate improved functional strength and transfer efficiency.  Baseline: 41.28 sec (1/19), 32.97 sec (2/19) Goal status: REVISED  3.  Pt will improve gait velocity to at least 1.5 ft/sec for improved gait efficiency and performance at mod I level  Baseline: 0.95 ft/sec with SPC (1/19), 0.96 ft/sec with SPC (2/19) Goal status: INITIAL  4.  Pt will improve normal TUG to less than or equal to 30 seconds for improved functional mobility and decreased fall risk. Baseline: 40 sec with SPC (1/19), 37 sec with SPC  (2/19) Goal status: INITIAL  5.  Pt will improve FOTO score to at least 54% to demonstrate improved function. Baseline: 52% (1/19) Goal status: INITIAL   ASSESSMENT:  CLINICAL IMPRESSION: Emphasis of skilled PT session on reassessing STG and adding to HEP for LLE strengthening. Pt has met 3/5 STG but is making progress towards 5/5 STG. He is independent with his initial HEP, has trialed the Swedish knee cage with his AFO demonstrating that he would benefit from this type of brace and/or a modification to his KAFO to block knee hyperextension, has  improved his 5xSTS score from 41.28 sec initially to 32.97 sec this date, has increased his gait speed from 0.95 ft/sec to 0.96 ft/sec, and has improved his TUG score from 40 sec initially to 37 sec this date, not quite meeting his goal of 35 sec. Overall, the patient demonstrates decreased fall risk and improved overall functional mobility based on these improvements. Pt does continue to exhibit significant LLE hip flexor, knee flexor, and quad weakness leading to gait and balance impairments and therefore pt continues to benefit from skilled therapy services. Continue POC.    OBJECTIVE IMPAIRMENTS: Abnormal gait, decreased balance, decreased endurance, decreased mobility, difficulty walking, decreased ROM, decreased strength, impaired UE functional use, and pain.   ACTIVITY LIMITATIONS: carrying, lifting, bending, standing, stairs, and transfers  PARTICIPATION LIMITATIONS: community activity and church  PERSONAL FACTORS: Time since onset of injury/illness/exacerbation and 1-2 comorbidities: CVA  are also affecting patient's functional outcome.   REHAB POTENTIAL: Fair time since onset of CVA (2006)  CLINICAL DECISION MAKING: Stable/uncomplicated  EVALUATION COMPLEXITY: Low  PLAN:  PT FREQUENCY: 1x/week  PT DURATION: 8 weeks  PLANNED INTERVENTIONS: Therapeutic exercises, Therapeutic activity, Neuromuscular re-education, Balance training,  Gait training, Patient/Family education, Self Care, Joint mobilization, Stair training, Vestibular training, Canalith repositioning, Visual/preceptual remediation/compensation, Orthotic/Fit training, DME instructions, Aquatic Therapy, Dry Needling, Electrical stimulation, Cryotherapy, Moist heat, Taping, Manual therapy, and Re-evaluation  PLAN FOR NEXT SESSION: how is HEP?, add to HEP for LLE strengthening (hip flex, quad strength, squats? ) and stretching, when is appt with [facility]?   Excell Seltzer, PT, DPT, CSRS 08/15/2022, 4:16 PM

## 2022-08-15 NOTE — Patient Instructions (Signed)
Patient instructed to take medications as defined in the Anti-coagulation Track section of this encounter.  Patient instructed to take today's dose.  Patient instructed to take one of your 24m peach-colored warfarin tablets by mouth, once-daily--EXCEPT on MONDAYS and THURSDAYS, take ONLY one-half (1/2) tablet on MONDAYS and THURSDAYS.  Patient verbalized understanding of these instructions.

## 2022-08-15 NOTE — Therapy (Incomplete)
OUTPATIENT PHYSICAL THERAPY NEURO TREATMENT   Patient Name: Alexander English MRN: [id number] DOB:1956/02/15, 67 y.o., male Today's Date: 08/15/2022   PCP: [name], DO REFERRING PROVIDER: [name], DO  END OF SESSION:      Past Medical History:  Diagnosis Date   Aortic atherosclerosis ([place]) 10/23/2017   Asymptomatic, seen on CT scan   Bilateral cataracts 05/18/2016   Not visually significant   Cancer ([place])    maxillary   Cerebrovascular accident (CVA) ([place]) 03/27/2020   Chronic constipation 06/12/2013   Chronic low back pain 06/12/2013   L4-5 facet arthropathy    Chronic pain of both shoulders 12/07/2013   A/C joint osteoarthritis    Cluster headaches    Resolved in 2006   COPD (chronic obstructive pulmonary disease) ([place])    Dry eye syndrome, bilateral [id number]   Embolic stroke involving right middle cerebral artery ([place]) 08/08/2004   Occured after traveling from [place] to [place] where he was hospitalized for 1 month with no further work-up or therapy.  Returned to [place] unable to walk and was admitted to [facility] immediately after landing. Presumed embolic per neuro but TEE, bubble study, and hypercoag W/U negative. On indefinite coumadin per patient's informed preference.  Residual effects : Left spastic hemiplegia. Tx with phenol tibi   Generalized anxiety disorder 08/03/2018   History of kidney stones    Hyperplastic colon polyp 07/27/2012   Excised endoscopically 07/27/2012   Hypertensive retinopathy of both eyes 05/18/2016   Internal and external hemorrhoids without complication 123XX123   Seen on colonoscopy 04/03/2007.   Left nephrolithiasis 08/31/2015   With mild left hydronephrosis.  Scheduled to undergo shockwave lithotripsy.   Obesity (BMI 30.0-34.9) 12/07/2013   Osteoarthritis of both knees 01/26/2012   Positive PPD 12/07/2013   Pulmonary embolism ([place]) 09/30/2004   Diagnosed in April 2006 after returning from [place].  Likely provoked as it  occurred after a 12 hour plane flight within 2 months of R MCA CVA with left hemiparesis. Patient has made an informed decision to remain on lifelong warfarin.    Tubular adenoma 12/19/2017   Endoscopically excised 04/03/2007.   Urge incontinence of urine 08/31/2015   Possibly secondary to prior CVA.  Treated with Myrbetriq.   Past Surgical History:  Procedure Laterality Date   CYSTOSCOPY W/ RETROGRADES Right 02/22/2021   Procedure: CYSTOSCOPY WITH RETROGRADE PYELOGRAM/ URETEROSCOPY/ POSSIBLE BIOPSY OF LESION, POSSIBLE RIGHT STENT PLACEMENT;  Surgeon: Janith Lima, MD;  Location: [facility];  Service: Urology;  Laterality: Right;  ONLY NEEDS 30 MIN   EXCISION NASAL MASS Left 10/06/2017   Procedure: EXCISION LEFT NASAL MASS;  Surgeon: [name], MD;  Location: [place];  Service: ENT;  Laterality: Left;  Removal of intvering papilloma frozen section   FOOT SURGERY Bilateral    Bunion   I & D EXTREMITY Left 11/28/2001   Left thumb thenar space abscess.   LITHOTRIPSY  2017   MAXILLECTOMY Left 11/22/2017   [name] 11/22/2017   MAXILLECTOMY Left 11/22/2017   Procedure: MAXILLECTOMY;  Surgeon: [name], MD;  Location: [place];  Service: ENT;  Laterality: Left;   NASAL SINUS SURGERY Left 10/06/2017   Procedure: ENDOSCOPIC SINUS SURGERY;  Surgeon: [name], MD;  Location: [place];  Service: ENT;  Laterality: Left;  Left side Endoscopic Macillary and Ethmoid Sinus   SKIN SPLIT GRAFT Left 11/22/2017   Procedure: SKIN GRAFT SPLIT THICKNESS;  Surgeon: [name], MD;  Location: [place];  Service: ENT;  Laterality: Left;  Patient Active Problem List   Diagnosis Date Noted   Swelling of left hand 08/15/2022   Bright red rectal bleeding 12/03/2020   Left hip pain 10/15/2020   Prediabetes 10/15/2020   Chest pain 05/07/2020   Tremor 03/27/2020   Mild cognitive impairment 03/27/2020   Action tremor 01/10/2020   Memory changes 01/10/2020   Primary osteoarthritis of both hips 10/20/2019   Serum calcium  elevated 01/22/2019   Epidermal inclusion cyst 08/08/2018   Generalized anxiety disorder 08/03/2018   Elevated blood pressure reading in office without diagnosis of hypertension 08/03/2018   Acute left-sided low back pain with left-sided sciatica 07/02/2018   Tubular adenoma 12/19/2017   Internal and external hemorrhoids without complication 123XX123   Aortic atherosclerosis ([place]) 10/23/2017   Erectile dysfunction due to arterial insufficiency 10/21/2016   Bilateral cataracts 05/18/2016   Hypertensive retinopathy of both eyes 05/18/2016   Dry eye syndrome, bilateral 05/18/2016   Left nephrolithiasis 08/31/2015   Urge incontinence of urine 08/31/2015   Onychomycosis of left great toe 03/19/2015   Chronic pain of both shoulders 12/07/2013   Positive PPD 12/07/2013   Obesity (BMI 30.0-34.9) 12/07/2013   Chronic low back pain 06/12/2013   Chronic constipation 06/12/2013   Healthcare maintenance 03/21/2012   Spastic hemiplegia affecting nondominant side ([place]) 01/26/2012   Osteoarthritis of both knees 01/26/2012   Hyperlipidemia 11/16/2006   History of venous thromboembolism 09/30/2004   History of stroke with residual deficit 08/08/2004    ONSET DATE: 07/04/2022  REFERRING DIAG: G81.10 (ICD-10-CM) - Spastic hemiplegia affecting nondominant side ([place])  THERAPY DIAG:  No diagnosis found.  Rationale for Evaluation and Treatment: Rehabilitation  SUBJECTIVE:                                                                                                                                                                                             SUBJECTIVE STATEMENT: ***  Pt accompanied by: self  PERTINENT HISTORY: per PCP visit 05/05/2022: Alexander English is a 67 y.o. male who presents today for his Annual Wellness Visit. He reports consuming a general and african carbohydrate rich  diet. The patient does not participate in regular exercise at present. Concerned about knee  He  generally feels fairly well. He reports sleeping well. He does have additional problems to discuss today.    He is requesting a brace or referral to [facility].  He has a history of CVA and results suspect cystic hemiplegia of his left foot for which he wears a AFO brace but that has not been resized in some time.  PAIN:  Are you having pain? Yes: NPRS  scale: not rated/10 Pain location: back of knee Pain description: soreness Aggravating factors: walking Relieving factors: rest  PRECAUTIONS: Fall  WEIGHT BEARING RESTRICTIONS: No  FALLS: Has patient fallen in last 6 months? No and frequent LOB but no falls  LIVING ENVIRONMENT: Lives with: lives alone Lives in: House/apartment Stairs: Yes: External: 2 steps; on right going up Has following equipment at home: Single point cane and shower chair  PLOF: Independent with gait, Independent with transfers, and Requires assistive device for independence  PATIENT GOALS: "I want to work on the overextension of my L knee"  OBJECTIVE:   DIAGNOSTIC FINDINGS:  Brain MRI 04/14/20 IMPRESSION:    MRI brain (without) demonstrating: - Chronic ischemic infarct in the right parietal and peri-insular region. - Moderate chronic small vessel ischemic disease. - No acute findings.   TODAY'S TREATMENT:                                                                                                                              ORTHOTIC ASSESSMENT: Assessed control of L knee hyperextension in standing and with short distance gait with use of Swedish knee cage this date along with pt's AFO. Pt does exhibit improved control of L knee hyperextension with use of Swedish knee cage this date as compared to use of his current KAFO. After discussion with orthotist he is recommending a block being placed on KAFO to prevent hyperextension, will reach out to referring physician for order.  THER EX: ***  PATIENT EDUCATION: Education details: initiated HEP,  process for obtaining a new brace*** Person educated: Patient Education method: Explanation, Demonstration, and Handouts Education comprehension: verbalized understanding, returned demonstration, and needs further education  HOME EXERCISE PROGRAM: Access Code: [email or web address] URL: [email or web address] Date: 08/12/2022 Prepared by: Excell Seltzer  Exercises - Seated Long Arc Quad  - 1 x daily - 7 x weekly - 3 sets - 10 reps - 5 sec hold - Standing Terminal Knee Extension with Resistance  - 1 x daily - 7 x weekly - 3 sets - 10 reps  GOALS: Goals reviewed with patient? Yes  SHORT TERM GOALS: Target date: 08/12/2022***  Pt will be independent with initial HEP for improved strength, balance, transfers and gait. Baseline: Goal status: INITIAL  2.  Pt to trial various bracing options to determine most appropriate orthotic to assist with LLE control during gait Baseline: KAFO (1/19) Goal status: INITIAL  3.  Pt will improve 5 x STS to less than or equal to 38 seconds to demonstrate improved functional strength and transfer efficiency.  Baseline: 41.28 sec (1/19) Goal status: INITIAL  4.  Pt will improve gait velocity to at least 1.25 ft/sec for improved gait efficiency and performance at mod I level  Baseline: 0.95 ft/sec with SPC (1/19) Goal status: INITIAL  5.  Pt will improve normal TUG to less than or equal to 35 seconds for improved functional mobility and decreased fall risk. Baseline: 40 sec with SPC (1/19)  Goal status: INITIAL   LONG TERM GOALS: Target date: 09/09/2022   Pt will be independent with final HEP for improved strength, balance, transfers and gait. Baseline:  Goal status: INITIAL  2.  Pt will improve 5 x STS to less than or equal to 35 seconds to demonstrate improved functional strength and transfer efficiency.  Baseline: 41.28 sec (1/19) Goal status: INITIAL  3.  Pt will improve gait velocity to at least 1.5 ft/sec for improved gait efficiency and  performance at mod I level  Baseline: 0.95 ft/sec with SPC (1/19) Goal status: INITIAL  4.  Pt will improve normal TUG to less than or equal to 30 seconds for improved functional mobility and decreased fall risk. Baseline: 40 sec with SPC (1/19) Goal status: INITIAL  5.  Pt will improve FOTO score to at least 54% to demonstrate improved function. Baseline: 52% (1/19) Goal status: INITIAL   ASSESSMENT:  CLINICAL IMPRESSION: Emphasis of skilled PT session on ***Continue POC.    OBJECTIVE IMPAIRMENTS: Abnormal gait, decreased balance, decreased endurance, decreased mobility, difficulty walking, decreased ROM, decreased strength, impaired UE functional use, and pain.   ACTIVITY LIMITATIONS: carrying, lifting, bending, standing, stairs, and transfers  PARTICIPATION LIMITATIONS: community activity and church  PERSONAL FACTORS: Time since onset of injury/illness/exacerbation and 1-2 comorbidities: CVA  are also affecting patient's functional outcome.   REHAB POTENTIAL: Fair time since onset of CVA (2006)  CLINICAL DECISION MAKING: Stable/uncomplicated  EVALUATION COMPLEXITY: Low  PLAN:  PT FREQUENCY: 1x/week  PT DURATION: 8 weeks  PLANNED INTERVENTIONS: Therapeutic exercises, Therapeutic activity, Neuromuscular re-education, Balance training, Gait training, Patient/Family education, Self Care, Joint mobilization, Stair training, Vestibular training, Canalith repositioning, Visual/preceptual remediation/compensation, Orthotic/Fit training, DME instructions, Aquatic Therapy, Dry Needling, Electrical stimulation, Cryotherapy, Moist heat, Taping, Manual therapy, and Re-evaluation  PLAN FOR NEXT SESSION: ASSESS STG, how is HEP?, add to HEP for LLE strengthening (hip flex, quad strength, squats? ) and stretching, did we get order for new KAFO?***   Excell Seltzer, PT, DPT, CSRS 08/15/2022, 1:04 PM

## 2022-08-15 NOTE — Progress Notes (Signed)
CC: hand swelling  HPI:  Alexander English is a 67 y.o. person with medical history as below presenting to [facility] for hand swelling  Please see problem-based list for further details, assessments, and plans.  Past Medical History:  Diagnosis Date   Aortic atherosclerosis ([place]) 10/23/2017   Asymptomatic, seen on CT scan   Bilateral cataracts 05/18/2016   Not visually significant   Cancer ([place])    maxillary   Cerebrovascular accident (CVA) ([place]) 03/27/2020   Chronic constipation 06/12/2013   Chronic low back pain 06/12/2013   L4-5 facet arthropathy    Chronic pain of both shoulders 12/07/2013   A/C joint osteoarthritis    Cluster headaches    Resolved in 2006   COPD (chronic obstructive pulmonary disease) ([place])    Dry eye syndrome, bilateral [id number]   Embolic stroke involving right middle cerebral artery ([place]) 08/08/2004   Occured after traveling from [place] to [place] where he was hospitalized for 1 month with no further work-up or therapy.  Returned to [place] unable to walk and was admitted to [facility] immediately after landing. Presumed embolic per neuro but TEE, bubble study, and hypercoag W/U negative. On indefinite coumadin per patient's informed preference.  Residual effects : Left spastic hemiplegia. Tx with phenol tibi   Generalized anxiety disorder 08/03/2018   History of kidney stones    Hyperplastic colon polyp 07/27/2012   Excised endoscopically 07/27/2012   Hypertensive retinopathy of both eyes 05/18/2016   Internal and external hemorrhoids without complication 123XX123   Seen on colonoscopy 04/03/2007.   Left nephrolithiasis 08/31/2015   With mild left hydronephrosis.  Scheduled to undergo shockwave lithotripsy.   Obesity (BMI 30.0-34.9) 12/07/2013   Osteoarthritis of both knees 01/26/2012   Positive PPD 12/07/2013   Pulmonary embolism ([place]) 09/30/2004   Diagnosed in April 2006 after returning from [place].  Likely provoked as it occurred after a 12 hour plane  flight within 2 months of R MCA CVA with left hemiparesis. Patient has made an informed decision to remain on lifelong warfarin.    Tubular adenoma 12/19/2017   Endoscopically excised 04/03/2007.   Urge incontinence of urine 08/31/2015   Possibly secondary to prior CVA.  Treated with Myrbetriq.   Review of Systems:  As per HPI  Physical Exam:  Vitals:   08/15/22 0914  BP: (!) 143/78  Pulse: 71  Temp: 98.2 F (36.8 C)  TempSrc: Oral  SpO2: 100%  Weight: 233 lb 11.2 oz (106 kg)  Height: 6' (1.829 m)   General: Resting comfortably in no acute distress Pulm: Normal work of breathing on room air CV: 2+ radial pulse in left upper extremity. MSK: Normal bulk, tone. Digits of L hand w/ contractures. Left hand diffusely swollen. No joint tenderness, point tenderness. R hand not swollen and normal active range of motion.  Skin: Warm, dry. No erythema, warmth, or open wounds appreciated in left hand.  Neuro: Awake, alert, conversing appropriately. Sensation in tact throughout left hand. Psych: Normal mood, affect, speech.  Assessment & Plan:   Swelling of left hand Alexander English is presenting to clinic today for acute left hand swelling. Reports three days ago he woke up and noticed his hand was swollen. Since that time he reports increased general aches in the hand, but denies any specific joint that is bothering him or time of day that the pain is worse. Also denies any inciting event or recent wound. Has tried Tylenol without much relief. He has been compliant with all of his  medications, including warfarin.   On examination, the hand is diffusely swollen, no nodules or specific joints that are swollen. No erythema, warmth, or open wounds. Radial pulse 2+. Presentation is not consistent with gout, rheumatoid arthritis, cellulitis, clots, or systemic causes. He does have a significant history of osteoarthritis in other joints. Most likely this is osteoarthritis causing his symptoms.  Therefore plan to give voltaren gel, have him return to clinic if symptoms do not improve.  - Voltaren gel as needed - Continue Tylenol twice daily - Return precautions given  Patient discussed with Dr. Denita Lung, MD Internal Medicine PGY-3 Pager: [phone]

## 2022-08-15 NOTE — Patient Instructions (Signed)
Mr.Randle Eulis Canner, it was a pleasure seeing you today!  Today we discussed: - I have sent in voltaren gel to help with the pain in your hand.  - I have sent in your refills.   I have ordered the following medication/changed the following medications:   Start the following medications: Meds ordered this encounter  Medications   mirabegron ER (MYRBETRIQ) 50 MG TB24 tablet    Sig: Take 1 tablet (50 mg total) by mouth daily.    Dispense:  90 tablet    Refill:  2   fesoterodine (TOVIAZ) 8 MG TB24 tablet    Sig: Take 1 tablet (8 mg total) by mouth daily.    Dispense:  90 tablet    Refill:  3   propranolol ER (INDERAL LA) 60 MG 24 hr capsule    Sig: Take 1 capsule (60 mg total) by mouth daily.    Dispense:  90 capsule    Refill:  3   atorvastatin (LIPITOR) 10 MG tablet    Sig: Take 1 tablet (10 mg total) by mouth daily.    Dispense:  90 tablet    Refill:  3   diclofenac Sodium (VOLTAREN ARTHRITIS PAIN) 1 % GEL    Sig: Apply 2 g topically 4 (four) times daily.    Dispense:  50 g    Refill:  0     Follow-up:  if symptoms do not improve    Please make sure to arrive 15 minutes prior to your next appointment. If you arrive late, you may be asked to reschedule.   We look forward to seeing you next time. Please call our clinic at [phone] if you have any questions or concerns. The best time to call is Monday-Friday from 9am-4pm, but there is someone available 24/7. If after hours or the weekend, call the main hospital number and ask for the Internal Medicine Resident On-Call. If you need medication refills, please notify your pharmacy one week in advance and they will send [place] a request.  Thank you for letting us take part in your care. Wishing you the best!  Thank you, Sanjuan Dame, MD

## 2022-08-15 NOTE — Patient Instructions (Signed)
Swelling Reduction  Place affected area above the level of your heart (20 minutes). You can use pillows for support.   Move the affected area. Think about tightening the muscles in this area and then relaxing them.   "Pet" the area from the outside of your body toward the center of your body. If it is your hand, from the tips of your fingers to the shoulder or chest.

## 2022-08-15 NOTE — Progress Notes (Signed)
Anticoagulation Management Alexander English is a 67 y.o. male who reports to the clinic for monitoring of warfarin treatment.    Indication: CVA, DVT, and PE--History of; Long term current use of oral anticoagulant, warfarin with target INR range 2.0 - 3.0.  Duration: indefinite Supervising physician: Joni Reining  Anticoagulation Clinic Visit History: Patient does not report signs/symptoms of bleeding or thromboembolism  Other recent changes: No diet, medications, lifestyle changes except as noted in patient findings.  Anticoagulation Episode Summary     Current INR goal:  2.0-3.0  TTR:  81.8 % (10.4 y)  Next INR check:  09/12/2022  INR from last check:  3.3 (08/15/2022)  Weekly max warfarin dose:    Target end date:  Indefinite  INR check location:  Anticoagulation Clinic  Preferred lab:    Send INR reminders to:  [facility]   Indications   History of venous thromboembolism [Z86.718]        Comments:           No Known Allergies  Current Outpatient Medications:    aspirin EC 81 MG tablet, Take 81 mg by mouth daily., Disp: , Rfl:    Multiple Vitamin (MULTIVITAMIN) tablet, Take 1 tablet by mouth daily., Disp: , Rfl:    warfarin (COUMADIN) 5 MG tablet, Take 1 tablet (5 mg total) by mouth daily at 4 PM., Disp: 180 tablet, Rfl: 0   zolpidem (AMBIEN) 5 MG tablet, Take 1 tablet (5 mg total) by mouth at bedtime as needed for sleep., Disp: 30 tablet, Rfl: 2   atorvastatin (LIPITOR) 10 MG tablet, Take 1 tablet (10 mg total) by mouth daily., Disp: 90 tablet, Rfl: 3   diclofenac Sodium (VOLTAREN ARTHRITIS PAIN) 1 % GEL, Apply 2 g topically 4 (four) times daily., Disp: 50 g, Rfl: 0   fesoterodine (TOVIAZ) 8 MG TB24 tablet, Take 1 tablet (8 mg total) by mouth daily., Disp: 90 tablet, Rfl: 3   mirabegron ER (MYRBETRIQ) 50 MG TB24 tablet, Take 1 tablet (50 mg total) by mouth daily., Disp: 90 tablet, Rfl: 2   propranolol ER (INDERAL LA) 60 MG 24 hr capsule, Take 1 capsule (60 mg  total) by mouth daily., Disp: 90 capsule, Rfl: 3   tamsulosin (FLOMAX) 0.4 MG CAPS capsule, Take 0.4 mg by mouth daily. (Patient not taking: Reported on 11/15/2021), Disp: , Rfl:    traMADol (ULTRAM) 50 MG tablet, Take 50 mg by mouth every 6 (six) hours as needed. (Patient not taking: Reported on 11/15/2021), Disp: , Rfl:  Past Medical History:  Diagnosis Date   Aortic atherosclerosis ([place]) 10/23/2017   Asymptomatic, seen on CT scan   Bilateral cataracts 05/18/2016   Not visually significant   Cancer ([place])    maxillary   Cerebrovascular accident (CVA) ([place]) 03/27/2020   Chronic constipation 06/12/2013   Chronic low back pain 06/12/2013   L4-5 facet arthropathy    Chronic pain of both shoulders 12/07/2013   A/C joint osteoarthritis    Cluster headaches    Resolved in 2006   COPD (chronic obstructive pulmonary disease) ([place])    Dry eye syndrome, bilateral [id number]   Embolic stroke involving right middle cerebral artery ([place]) 08/08/2004   Occured after traveling from [place] to [place] where he was hospitalized for 1 month with no further work-up or therapy.  Returned to [place] unable to walk and was admitted to [facility] immediately after landing. Presumed embolic per neuro but TEE, bubble study, and hypercoag W/U negative. On indefinite coumadin per patient's  informed preference.  Residual effects : Left spastic hemiplegia. Tx with phenol tibi   Generalized anxiety disorder 08/03/2018   History of kidney stones    Hyperplastic colon polyp 07/27/2012   Excised endoscopically 07/27/2012   Hypertensive retinopathy of both eyes 05/18/2016   Internal and external hemorrhoids without complication [id number]   Seen on colonoscopy 04/03/2007.   Left nephrolithiasis 08/31/2015   With mild left hydronephrosis.  Scheduled to undergo shockwave lithotripsy.   Obesity (BMI 30.0-34.9) 12/07/2013   Osteoarthritis of both knees 01/26/2012   Positive PPD 12/07/2013   Pulmonary embolism ([place]) 09/30/2004   Diagnosed  in April 2006 after returning from [place].  Likely provoked as it occurred after a 12 hour plane flight within 2 months of R MCA CVA with left hemiparesis. Patient has made an informed decision to remain on lifelong warfarin.    Tubular adenoma 12/19/2017   Endoscopically excised 04/03/2007.   Urge incontinence of urine 08/31/2015   Possibly secondary to prior CVA.  Treated with Myrbetriq.   Social History   Socioeconomic History   Marital status: Single    Spouse name: Not on file   Number of children: 3   Years of education: 16   Highest education level: Not on file  Occupational History   Occupation: Retired  Tobacco Use   Smoking status: Former    Types: Cigarettes    Quit date: 06/28/2007    Years since quitting: 15.1   Smokeless tobacco: Never  Vaping Use   Vaping Use: Never used  Substance and Sexual Activity   Alcohol use: Yes    Comment: Rarely.   Drug use: No   Sexual activity: Not on file  Other Topics Concern   Not on file  Social History Narrative   Born in [place] but has been in the [place] for > 30 years.  Divorced, 1 daughter and 2 sons.  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   Right handed   Coffee/tea sometimes, soda rarely   Social Determinants of Health   Financial Resource Strain: Low Risk  (08/15/2022)   Overall Financial Resource Strain (CARDIA)    Difficulty of Paying Living Expenses: Not hard at all  Food Insecurity: No Food Insecurity (08/15/2022)   Hunger Vital Sign    Worried About Running Out of Food in the Last Year: Never true    Ran Out of Food in the Last Year: Never true  Transportation Needs: Unknown (05/05/2022)   PRAPARE - Hydrologist (Medical): No    Lack of Transportation (Non-Medical): Not on file  Physical Activity: Inactive (01/17/2019)   Exercise Vital Sign    Days of Exercise per Week: 0 days    Minutes of Exercise per Session: 0 min  Stress: No Stress  Concern Present (01/17/2019)   [place]    Feeling of Stress : Only a little  Social Connections: Moderately Isolated (08/15/2022)   Social Connection and Isolation Panel [NHANES]    Frequency of Communication with Friends and Family: More than three times a week    Frequency of Social Gatherings with Friends and Family: More than three times a week    Attends Religious Services: More than 4 times per year    Active Member of [employer] or Organizations: No    Attends Archivist Meetings: Never    Marital Status: Divorced   Family History  Problem Relation Age of Onset   Stroke Maternal Grandmother    Unexplained death Mother    Depression Mother        After husband's death   Unexplained death Father    Osteoarthritis Father        Knees   Early death Sister    Seizures Sister    Early death Brother 34       Unknown cause   Healthy Daughter    Healthy Son    Stroke Maternal Aunt    Healthy Sister    Healthy Sister    Unexplained death Brother    Healthy Brother    Healthy Son     ASSESSMENT Recent Results: The most recent result is correlated with 32.5 mg per week: Lab Results  Component Value Date   INR 3.3 (A) 08/15/2022   INR 3.0 07/04/2022   INR 2.1 05/05/2022    Anticoagulation Dosing: Description   Take one of your 18m peach-colored warfarin tablets by mouth, once-daily--EXCEPT on MONDAYS and THURSDAYS, take ONLY one-half (1/2) tablet on MONDAYS and THURSDAYS.        INR today: Supratherapeutic  PLAN Weekly dose was decreased by 7% to 30 mg per week  Patient Instructions  Patient instructed to take medications as defined in the Anti-coagulation Track section of this encounter.  Patient instructed to take today's dose.  Patient instructed to take one of your 53mpeach-colored warfarin tablets by mouth, once-daily--EXCEPT on MONDAYS and THURSDAYS, take ONLY one-half (1/2) tablet on  MONDAYS and THURSDAYS.  Patient verbalized understanding of these instructions.  Patient advised to contact clinic or seek medical attention if signs/symptoms of bleeding or thromboembolism occur.  Patient verbalized understanding by repeating back information and was advised to contact me if further medication-related questions arise. Patient was also provided an information handout.  Follow-up Return in 4 weeks (on 09/12/2022) for Follow up INR.  JaDorene Greberoce  15 minutes spent face-to-face with the patient during the encounter. 50% of time spent on education, including signs/sx bleeding and clotting, as well as food and drug interactions with warfarin. 50% of time was spent on fingerprick POC INR sample collection,processing, results determination, and documentation in [email or web address]

## 2022-08-17 NOTE — Progress Notes (Signed)
Internal Medicine Clinic Attending  Case discussed with the resident at the time of the visit.  We reviewed the resident's history and exam and pertinent patient test results.  I agree with the assessment, diagnosis, and plan of care documented in the resident's note.  

## 2022-08-19 ENCOUNTER — Encounter: Payer: Self-pay | Admitting: Occupational Therapy

## 2022-08-19 ENCOUNTER — Ambulatory Visit: Payer: Medicare HMO | Admitting: Physical Therapy

## 2022-08-26 ENCOUNTER — Ambulatory Visit: Payer: Medicare HMO | Admitting: Physical Therapy

## 2022-08-27 IMAGING — CT CT CHEST LUNG CANCER SCREENING LOW DOSE W/O CM
1 series · 10 of 10 positions shown, 13 images · non-contrast
Comparison: CT lung cancer screening dated November 18, 2019

CLINICAL DATA: Former smoker with 30 pack-year history

EXAM:
CT CHEST WITHOUT CONTRAST LOW-DOSE FOR LUNG CANCER SCREENING
TECHNIQUE: Multidetector CT imaging of the chest was performed following the
standard protocol without IV contrast.

[ct lung segmentation data · axial · 0.79mm/px · z∈[-363,-363]mm · 10 of 295 frames shown]
[frame 1/295  mediastinal]
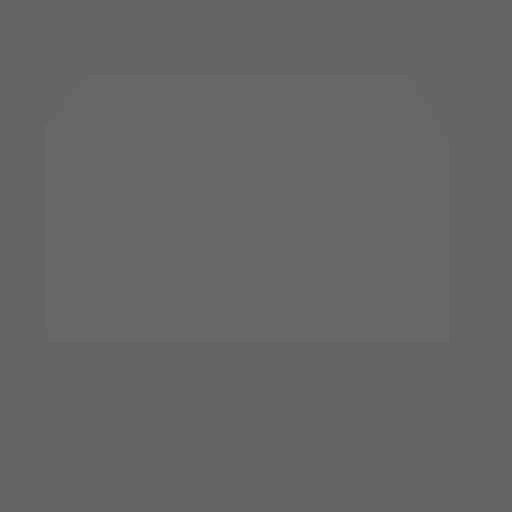
[frame 1/295  lung]
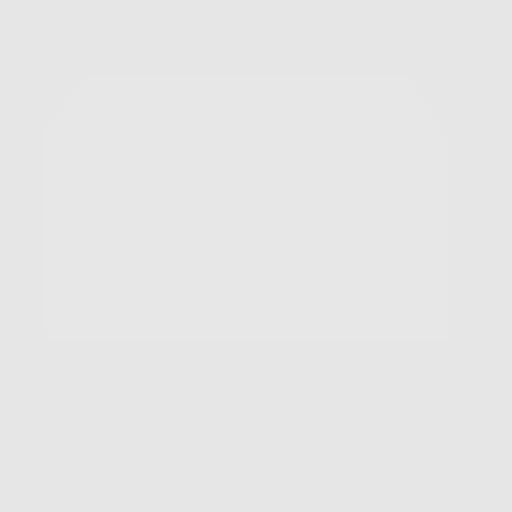
[frame 33/295  lung]
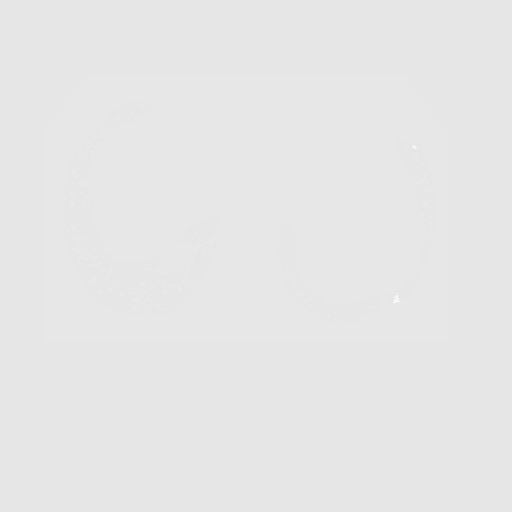
[frame 66/295  lung]
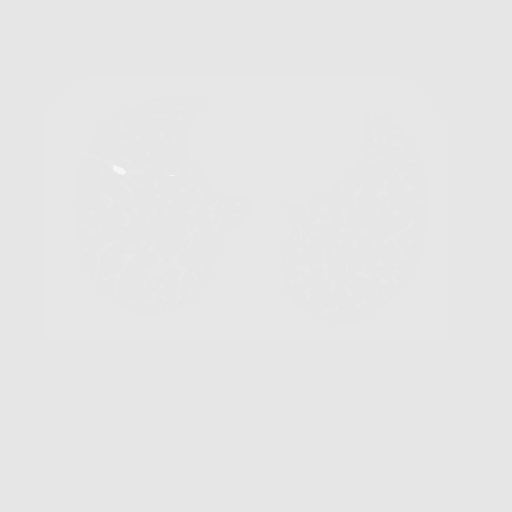
[frame 99/295  lung]
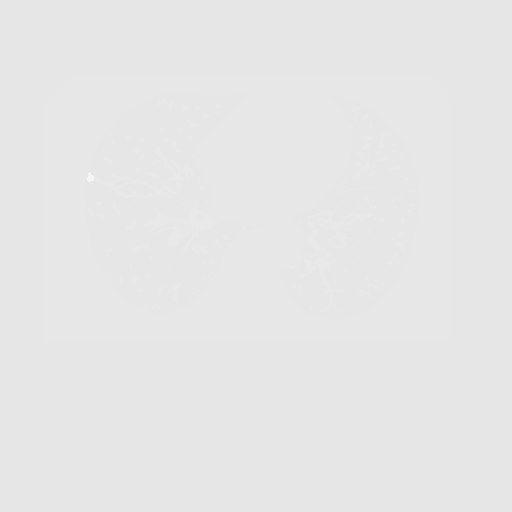
[frame 131/295  mediastinal]
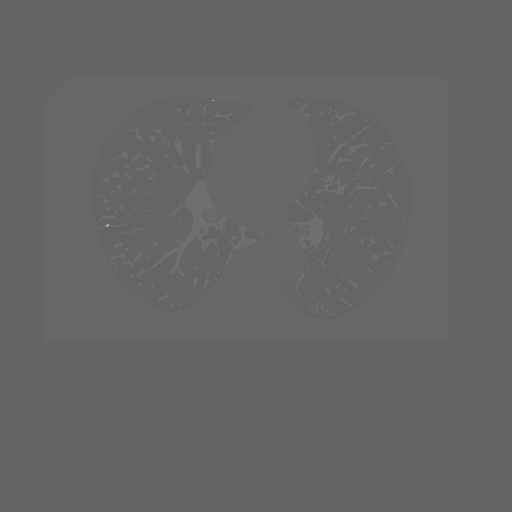
[frame 131/295  lung]
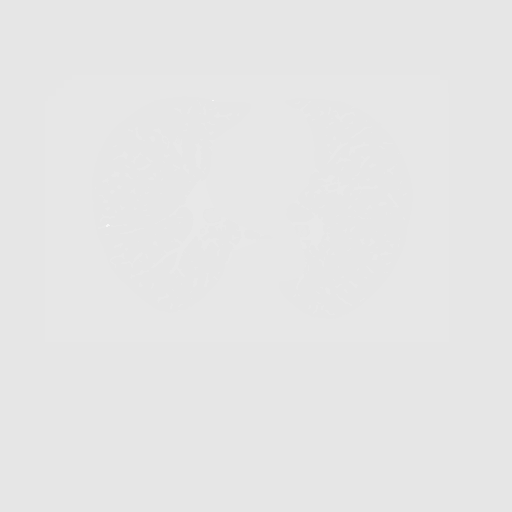
[frame 164/295  lung]
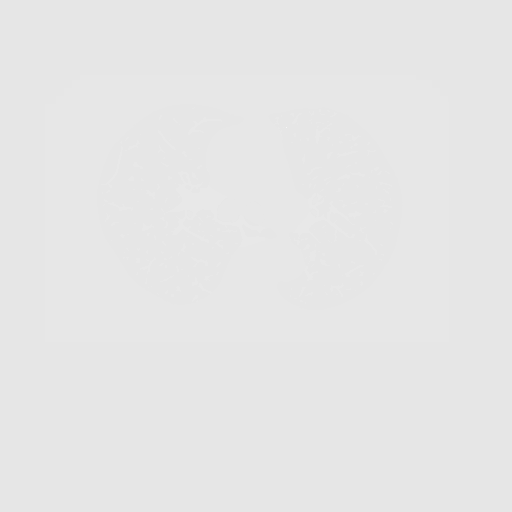
[frame 197/295  lung]
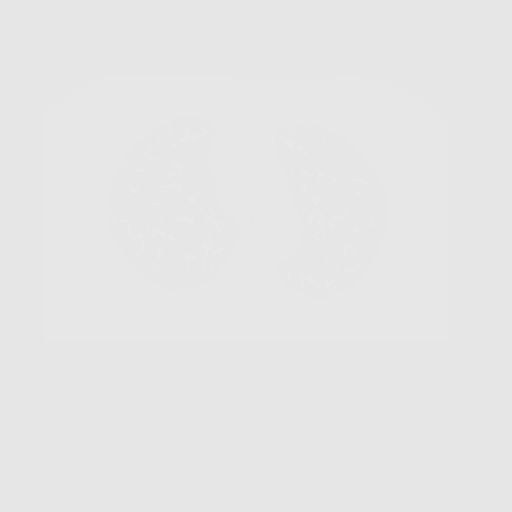
[frame 229/295  lung]
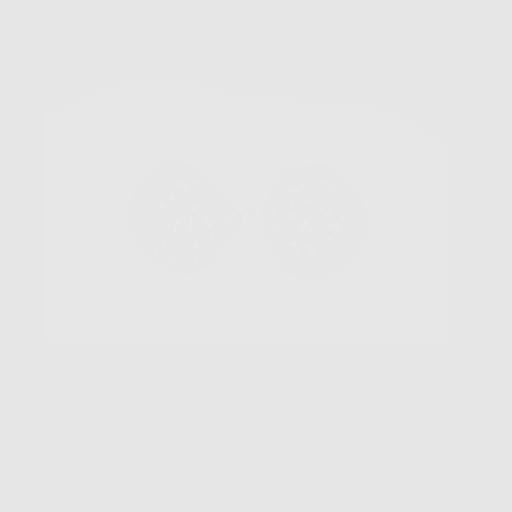
[frame 262/295  mediastinal]
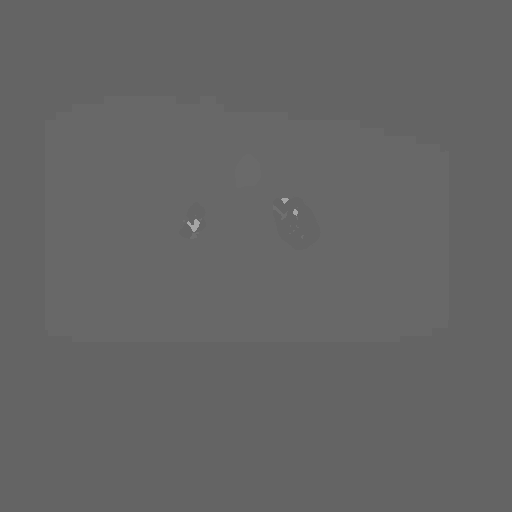
[frame 262/295  lung]
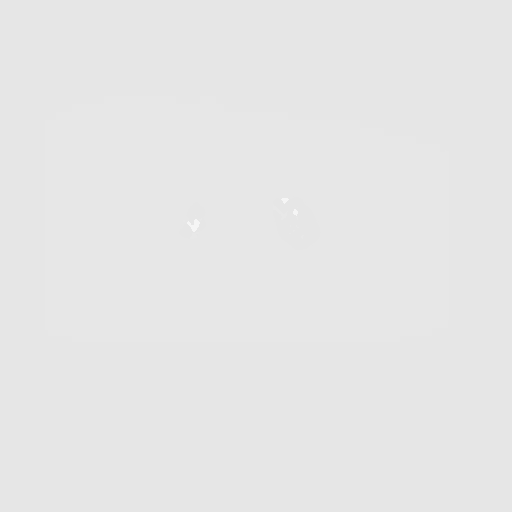
[frame 295/295  lung]
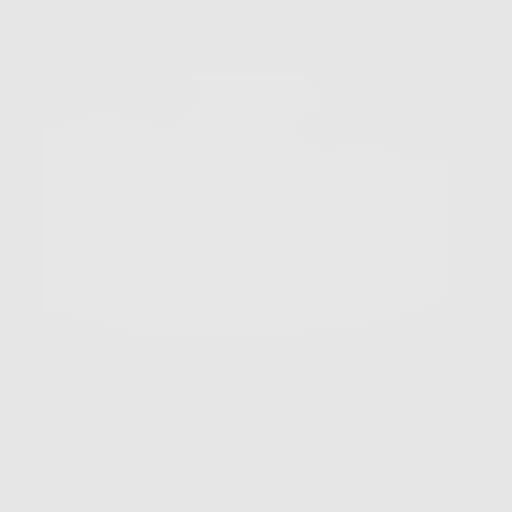

[10 of 10 positions shown; findings below may reference images not displayed]

FINDINGS: Cardiovascular: Normal heart size. No pericardial effusion.
Three-vessel coronary artery calcifications. Atherosclerotic disease
of the thoracic aorta.

Mediastinum/Nodes: Patulous esophagus. Thyroid is unremarkable. No
pathologically enlarged nodes seen in the chest.

Lungs/Pleura: Central airways are patent. Centrilobular and
paraseptal emphysema. No consolidation, pleural effusion or
pneumothorax. No new or enlarging pulmonary nodules.

Upper Abdomen: No acute abnormality.

Musculoskeletal: No chest wall mass or suspicious bone lesions
identified.
IMPRESSION: 1. Lung-RADS 1, negative. Continue annual screening with low-dose
chest CT without contrast in 12 months.
2. Three-vessel coronary artery calcifications, recommend ASCVD risk
assessment.
3. Aortic Atherosclerosis (0NEE9-TX4.4) and Emphysema (0NEE9-PFQ.L).

## 2022-08-29 ENCOUNTER — Ambulatory Visit: Payer: Medicare HMO | Attending: Internal Medicine | Admitting: Occupational Therapy

## 2022-08-29 ENCOUNTER — Encounter: Payer: Self-pay | Admitting: Occupational Therapy

## 2022-08-29 ENCOUNTER — Ambulatory Visit: Payer: Medicare HMO | Admitting: Physical Therapy

## 2022-08-29 DIAGNOSIS — M79642 Pain in left hand: Secondary | ICD-10-CM | POA: Insufficient documentation

## 2022-08-29 DIAGNOSIS — G8110 Spastic hemiplegia affecting unspecified side: Secondary | ICD-10-CM | POA: Insufficient documentation

## 2022-08-29 DIAGNOSIS — M6281 Muscle weakness (generalized): Secondary | ICD-10-CM

## 2022-08-29 DIAGNOSIS — R2689 Other abnormalities of gait and mobility: Secondary | ICD-10-CM

## 2022-08-29 DIAGNOSIS — R2681 Unsteadiness on feet: Secondary | ICD-10-CM

## 2022-08-29 NOTE — Therapy (Signed)
OUTPATIENT PHYSICAL THERAPY NEURO TREATMENT   Patient Name: Alexander English MRN: [id number] DOB:16-Aug-1955, 67 y.o., male Today's Date: 08/29/2022   PCP: [name], DO REFERRING PROVIDER: [name], DO  END OF SESSION:  PT End of Session - 08/29/22 1619     Visit Number 5    Number of Visits 9   with eval   Date for PT Re-Evaluation 09/23/22   to allow for scheduling delays   Authorization Type [employer]    PT Start Time 1619   from OT session   PT Stop Time 1702    PT Time Calculation (min) 43 min    Equipment Utilized During Treatment Other (comment)   KAFO   Activity Tolerance Patient tolerated treatment well    Behavior During Therapy [facility] for tasks assessed/performed                 Past Medical History:  Diagnosis Date   Aortic atherosclerosis ([place]) 10/23/2017   Asymptomatic, seen on CT scan   Bilateral cataracts 05/18/2016   Not visually significant   Cancer ([place])    maxillary   Cerebrovascular accident (CVA) ([place]) 03/27/2020   Chronic constipation 06/12/2013   Chronic low back pain 06/12/2013   L4-5 facet arthropathy    Chronic pain of both shoulders 12/07/2013   A/C joint osteoarthritis    Cluster headaches    Resolved in 2006   COPD (chronic obstructive pulmonary disease) ([place])    Dry eye syndrome, bilateral [id number]   Embolic stroke involving right middle cerebral artery ([place]) 08/08/2004   Occured after traveling from [place] to [place] where he was hospitalized for 1 month with no further work-up or therapy.  Returned to [place] unable to walk and was admitted to [facility] immediately after landing. Presumed embolic per neuro but TEE, bubble study, and hypercoag W/U negative. On indefinite coumadin per patient's informed preference.  Residual effects : Left spastic hemiplegia. Tx with phenol tibi   Generalized anxiety disorder 08/03/2018   History of kidney stones    Hyperplastic colon polyp 07/27/2012   Excised endoscopically 07/27/2012    Hypertensive retinopathy of both eyes 05/18/2016   Internal and external hemorrhoids without complication [id number]   Seen on colonoscopy 04/03/2007.   Left nephrolithiasis 08/31/2015   With mild left hydronephrosis.  Scheduled to undergo shockwave lithotripsy.   Obesity (BMI 30.0-34.9) 12/07/2013   Osteoarthritis of both knees 01/26/2012   Positive PPD 12/07/2013   Pulmonary embolism ([place]) 09/30/2004   Diagnosed in April 2006 after returning from [place].  Likely provoked as it occurred after a 12 hour plane flight within 2 months of R MCA CVA with left hemiparesis. Patient has made an informed decision to remain on lifelong warfarin.    Tubular adenoma 12/19/2017   Endoscopically excised 04/03/2007.   Urge incontinence of urine 08/31/2015   Possibly secondary to prior CVA.  Treated with Myrbetriq.   Past Surgical History:  Procedure Laterality Date   CYSTOSCOPY W/ RETROGRADES Right 02/22/2021   Procedure: CYSTOSCOPY WITH RETROGRADE PYELOGRAM/ URETEROSCOPY/ POSSIBLE BIOPSY OF LESION, POSSIBLE RIGHT STENT PLACEMENT;  Surgeon: Janith Lima, MD;  Location: [facility];  Service: Urology;  Laterality: Right;  ONLY NEEDS 30 MIN   EXCISION NASAL MASS Left 10/06/2017   Procedure: EXCISION LEFT NASAL MASS;  Surgeon: [name], MD;  Location: [place];  Service: ENT;  Laterality: Left;  Removal of intvering papilloma frozen section   FOOT SURGERY Bilateral    Bunion   I & D  EXTREMITY Left 11/28/2001   Left thumb thenar space abscess.   LITHOTRIPSY  2017   MAXILLECTOMY Left 11/22/2017   [name] 11/22/2017   MAXILLECTOMY Left 11/22/2017   Procedure: MAXILLECTOMY;  Surgeon: [name], MD;  Location: [place];  Service: ENT;  Laterality: Left;   NASAL SINUS SURGERY Left 10/06/2017   Procedure: ENDOSCOPIC SINUS SURGERY;  Surgeon: [name], MD;  Location: [place];  Service: ENT;  Laterality: Left;  Left side Endoscopic Macillary and Ethmoid Sinus   SKIN SPLIT GRAFT Left 11/22/2017   Procedure: SKIN GRAFT  SPLIT THICKNESS;  Surgeon: [name], MD;  Location: [place];  Service: ENT;  Laterality: Left;   Patient Active Problem List   Diagnosis Date Noted   Swelling of left hand 08/15/2022   Bright red rectal bleeding 12/03/2020   Left hip pain 10/15/2020   Prediabetes 10/15/2020   Chest pain 05/07/2020   Tremor 03/27/2020   Mild cognitive impairment 03/27/2020   Action tremor 01/10/2020   Memory changes 01/10/2020   Primary osteoarthritis of both hips 10/20/2019   Serum calcium elevated 01/22/2019   Epidermal inclusion cyst 08/08/2018   Generalized anxiety disorder 08/03/2018   Elevated blood pressure reading in office without diagnosis of hypertension 08/03/2018   Acute left-sided low back pain with left-sided sciatica 07/02/2018   Tubular adenoma 12/19/2017   Internal and external hemorrhoids without complication [id number]   Aortic atherosclerosis ([place]) 10/23/2017   Erectile dysfunction due to arterial insufficiency 10/21/2016   Bilateral cataracts 05/18/2016   Hypertensive retinopathy of both eyes 05/18/2016   Dry eye syndrome, bilateral 05/18/2016   Left nephrolithiasis 08/31/2015   Urge incontinence of urine 08/31/2015   Onychomycosis of left great toe 03/19/2015   Chronic pain of both shoulders 12/07/2013   Positive PPD 12/07/2013   Obesity (BMI 30.0-34.9) 12/07/2013   Chronic low back pain 06/12/2013   Chronic constipation 06/12/2013   Healthcare maintenance 03/21/2012   Spastic hemiplegia affecting nondominant side ([place]) 01/26/2012   Osteoarthritis of both knees 01/26/2012   Hyperlipidemia 11/16/2006   History of venous thromboembolism 09/30/2004   History of stroke with residual deficit 08/08/2004    ONSET DATE: 07/04/2022  REFERRING DIAG: G81.10 (ICD-10-CM) - Spastic hemiplegia affecting nondominant side (HCC)  THERAPY DIAG:  Muscle weakness (generalized)  Spastic hemiplegia affecting nondominant side (HCC)  Unsteadiness on feet  Other abnormalities of  gait and mobility  Rationale for Evaluation and Treatment: Rehabilitation  SUBJECTIVE:  SUBJECTIVE STATEMENT: Pt reports that he went to [facility] earlier today, going to get an new KAFO that will be much lighter, will go back 4/2 to get his KAFO. Pt asking this therapist to call his orthotist Minette Brine) at [place] to discuss his brace. Pt also continues to report that he prefers the L AFO with the Swedish knee cage vs a new KAFO, will continue to problem-solve best brace for pt's needs. Pt did have a posterior strap added to his KAFO today to assist with hyperextension. Pt also reports that he was able to find a way to do the TKE exercise, tied the exercise band to the leg of his coffee table.  Pt accompanied by: self  PERTINENT HISTORY: per PCP visit 05/05/2022: NYSEAN PRATS is a 67 y.o. male who presents today for his Annual Wellness Visit. He reports consuming a general and african carbohydrate rich  diet. The patient does not participate in regular exercise at present. Concerned about knee  He generally feels fairly well. He reports sleeping well. He does have additional problems to discuss today.    He is requesting a brace or referral to [facility].  He has a history of CVA and results suspect cystic hemiplegia of his left foot for which he wears a AFO brace but that has not been resized in some time.  PAIN:  Are you having pain? Yes: NPRS scale: not rated/10 Pain location: back of knee Pain description: soreness Aggravating factors: walking Relieving factors: rest  PRECAUTIONS: Fall  WEIGHT BEARING RESTRICTIONS: No  FALLS: Has patient fallen in last 6 months? No and frequent LOB but no falls  LIVING ENVIRONMENT: Lives with: lives alone Lives in: House/apartment Stairs: Yes: External: 2  steps; on right going up Has following equipment at home: Single point cane and shower chair  PLOF: Independent with gait, Independent with transfers, and Requires assistive device for independence  PATIENT GOALS: "I want to work on the overextension of my L knee"  OBJECTIVE:   DIAGNOSTIC FINDINGS:  Brain MRI 04/14/20 IMPRESSION:    MRI brain (without) demonstrating: - Chronic ischemic infarct in the right parietal and peri-insular region. - Moderate chronic small vessel ischemic disease. - No acute findings.   TODAY'S TREATMENT:                                                                                                                               THER ACT: In // bars with one UE support to work on strengthening L hip flexors and abductors: Forwards yard stick stepovers x 10 reps Lateral yard stick stepovers x 10 reps Sidesteps L/R 3 x 10 ft each direction  Added to HEP, see bolded below   THER EX: Supine and standing therex for L quad strengthening: Supine SAQ x 10 reps with pillow under knee Standing mini-squats x 10 reps while holding onto chair for balance  Added to HEP, see bolded below  PATIENT EDUCATION: Education  details: added to HEP, PT POC Person educated: Patient Education method: Explanation, Demonstration, and Handouts Education comprehension: verbalized understanding, returned demonstration, and needs further education  HOME EXERCISE PROGRAM: Access Code: [email or web address] URL: [email or web address] Date: 08/12/2022 Prepared by: Excell Seltzer  Exercises - Seated Long Arc Quad  - 1 x daily - 7 x weekly - 3 sets - 10 reps - 5 sec hold - Standing Terminal Knee Extension with Resistance  - 1 x daily - 7 x weekly - 3 sets - 10 reps - Standing Forward Step Taps with Counter Support  - 1 x daily - 7 x weekly - 3 sets - 10 reps - Supine Bridge  - 1 x daily - 7 x weekly - 3 sets - 10 reps - Supine March  - 1 x daily - 7 x weekly - 3 sets - 10  reps - Sidelying Bent Knee Hip Flexion  - 1 x daily - 7 x weekly - 3 sets - 10 reps - Supine Knee Extension Strengthening  - 1 x daily - 7 x weekly - 3 sets - 10 reps - 5 sec hold - Mini Squat with Counter Support  - 1 x daily - 7 x weekly - 3 sets - 10 reps - Side Stepping with Counter Support  - 1 x daily - 7 x weekly - 3 sets - 10 reps - Alternating Step Forward with Support  - 1 x daily - 7 x weekly - 3 sets - 10 reps   GOALS: Goals reviewed with patient? Yes  SHORT TERM GOALS: Target date: 08/12/2022  Pt will be independent with initial HEP for improved strength, balance, transfers and gait. Baseline: Goal status: MET  2.  Pt to trial various bracing options to determine most appropriate orthotic to assist with LLE control during gait Baseline: KAFO (1/19), trialed Swedish knee cage with AFO (2/16) Goal status: MET  3.  Pt will improve 5 x STS to less than or equal to 38 seconds to demonstrate improved functional strength and transfer efficiency.  Baseline: 41.28 sec (1/19), 32.97 sec (2/19) Goal status: MET  4.  Pt will improve gait velocity to at least 1.25 ft/sec for improved gait efficiency and performance at mod I level  Baseline: 0.95 ft/sec with SPC (1/19), 0.96 ft/sec with SPC (2/19) Goal status: IN PROGRESS  5.  Pt will improve normal TUG to less than or equal to 35 seconds for improved functional mobility and decreased fall risk. Baseline: 40 sec with SPC (1/19), 37 sec with SPC (2/19) Goal status: IN PROGRESS   LONG TERM GOALS: Target date: 09/09/2022   Pt will be independent with final HEP for improved strength, balance, transfers and gait. Baseline:  Goal status: INITIAL  2.  Pt will improve 5 x STS to less than or equal to 30 seconds to demonstrate improved functional strength and transfer efficiency.  Baseline: 41.28 sec (1/19), 32.97 sec (2/19) Goal status: REVISED  3.  Pt will improve gait velocity to at least 1.5 ft/sec for improved gait efficiency  and performance at mod I level  Baseline: 0.95 ft/sec with SPC (1/19), 0.96 ft/sec with SPC (2/19) Goal status: INITIAL  4.  Pt will improve normal TUG to less than or equal to 30 seconds for improved functional mobility and decreased fall risk. Baseline: 40 sec with SPC (1/19), 37 sec with SPC (2/19) Goal status: INITIAL  5.  Pt will improve FOTO score to at least 54% to demonstrate improved function. Baseline: 52% (1/19) Goal  status: INITIAL   ASSESSMENT:  CLINICAL IMPRESSION: Emphasis of skilled PT session on discussing pt's visit with [facility] and his bracing needs as well as adding to HEP for ongoing quad and hip strengthening in LLE. Pt continues to benefit from skilled therapy services to address L hemibody weakness leading to decreased safety and independence with functional mobility. Continue POC.    OBJECTIVE IMPAIRMENTS: Abnormal gait, decreased balance, decreased endurance, decreased mobility, difficulty walking, decreased ROM, decreased strength, impaired UE functional use, and pain.   ACTIVITY LIMITATIONS: carrying, lifting, bending, standing, stairs, and transfers  PARTICIPATION LIMITATIONS: community activity and church  PERSONAL FACTORS: Time since onset of injury/illness/exacerbation and 1-2 comorbidities: CVA  are also affecting patient's functional outcome.   REHAB POTENTIAL: Fair time since onset of CVA (2006)  CLINICAL DECISION MAKING: Stable/uncomplicated  EVALUATION COMPLEXITY: Low  PLAN:  PT FREQUENCY: 1x/week  PT DURATION: 8 weeks  PLANNED INTERVENTIONS: Therapeutic exercises, Therapeutic activity, Neuromuscular re-education, Balance training, Gait training, Patient/Family education, Self Care, Joint mobilization, Stair training, Vestibular training, Canalith repositioning, Visual/preceptual remediation/compensation, Orthotic/Fit training, DME instructions, Aquatic Therapy, Dry Needling, Electrical stimulation, Cryotherapy, Moist heat, Taping, Manual  therapy, and Re-evaluation  PLAN FOR NEXT SESSION: reach out to [facility] regarding pt's brace and call pt; reassess LTG and d/c from PT services   Excell Seltzer, PT, DPT, CSRS 08/29/2022, 5:02 PM

## 2022-08-29 NOTE — Therapy (Signed)
OUTPATIENT OCCUPATIONAL THERAPY NEURO TREATMENT  Patient Name: Alexander English MRN: [id number] DOB:Sep 27, 1955, 67 y.o., male Today's Date: 08/29/2022  PCP: [name], DO REFERRING PROVIDER: [name], DO  END OF SESSION:  OT End of Session - 08/29/22 1540     Visit Number 2    Number of Visits 11    Date for OT Re-Evaluation 10/26/22    Authorization Type [employer] - requires authorization    Authorization Time Period 08/15/22 - 10/26/22    Authorization - Visit Number 1    Authorization - Number of Visits 11    Progress Note Due on Visit 10    OT Start Time 1538    OT Stop Time 1616    OT Time Calculation (min) 38 min    Activity Tolerance Patient tolerated treatment well    Behavior During Therapy WFL for tasks assessed/performed             Past Medical History:  Diagnosis Date   Aortic atherosclerosis ([place]) 10/23/2017   Asymptomatic, seen on CT scan   Bilateral cataracts 05/18/2016   Not visually significant   Cancer ([place])    maxillary   Cerebrovascular accident (CVA) ([employer]) 03/27/2020   Chronic constipation 06/12/2013   Chronic low back pain 06/12/2013   L4-5 facet arthropathy    Chronic pain of both shoulders 12/07/2013   A/C joint osteoarthritis    Cluster headaches    Resolved in 2006   COPD (chronic obstructive pulmonary disease) ([place])    Dry eye syndrome, bilateral [id number]   Embolic stroke involving right middle cerebral artery ([place]) 08/08/2004   Occured after traveling from [place] to [place] where he was hospitalized for 1 month with no further work-up or therapy.  Returned to [place] unable to walk and was admitted to [facility] immediately after landing. Presumed embolic per neuro but TEE, bubble study, and hypercoag W/U negative. On indefinite coumadin per patient's informed preference.  Residual effects : Left spastic hemiplegia. Tx with phenol tibi   Generalized anxiety disorder 08/03/2018   History of kidney stones    Hyperplastic colon  polyp 07/27/2012   Excised endoscopically 07/27/2012   Hypertensive retinopathy of both eyes 05/18/2016   Internal and external hemorrhoids without complication 123XX123   Seen on colonoscopy 04/03/2007.   Left nephrolithiasis 08/31/2015   With mild left hydronephrosis.  Scheduled to undergo shockwave lithotripsy.   Obesity (BMI 30.0-34.9) 12/07/2013   Osteoarthritis of both knees 01/26/2012   Positive PPD 12/07/2013   Pulmonary embolism ([place]) 09/30/2004   Diagnosed in April 2006 after returning from [place].  Likely provoked as it occurred after a 12 hour plane flight within 2 months of R MCA CVA with left hemiparesis. Patient has made an informed decision to remain on lifelong warfarin.    Tubular adenoma 12/19/2017   Endoscopically excised 04/03/2007.   Urge incontinence of urine 08/31/2015   Possibly secondary to prior CVA.  Treated with Myrbetriq.   Past Surgical History:  Procedure Laterality Date   CYSTOSCOPY W/ RETROGRADES Right 02/22/2021   Procedure: CYSTOSCOPY WITH RETROGRADE PYELOGRAM/ URETEROSCOPY/ POSSIBLE BIOPSY OF LESION, POSSIBLE RIGHT STENT PLACEMENT;  Surgeon: Janith Lima, MD;  Location: [facility];  Service: Urology;  Laterality: Right;  ONLY NEEDS 30 MIN   EXCISION NASAL MASS Left 10/06/2017   Procedure: EXCISION LEFT NASAL MASS;  Surgeon: [name], MD;  Location: [place];  Service: ENT;  Laterality: Left;  Removal of intvering papilloma frozen section   FOOT SURGERY Bilateral  Bunion   I & D EXTREMITY Left 11/28/2001   Left thumb thenar space abscess.   LITHOTRIPSY  2017   MAXILLECTOMY Left 11/22/2017   [name] 11/22/2017   MAXILLECTOMY Left 11/22/2017   Procedure: MAXILLECTOMY;  Surgeon: [name], MD;  Location: [place];  Service: ENT;  Laterality: Left;   NASAL SINUS SURGERY Left 10/06/2017   Procedure: ENDOSCOPIC SINUS SURGERY;  Surgeon: [name], MD;  Location: [place];  Service: ENT;  Laterality: Left;  Left side Endoscopic Macillary and Ethmoid Sinus    SKIN SPLIT GRAFT Left 11/22/2017   Procedure: SKIN GRAFT SPLIT THICKNESS;  Surgeon: [name], MD;  Location: [place];  Service: ENT;  Laterality: Left;   Patient Active Problem List   Diagnosis Date Noted   Swelling of left hand 08/15/2022   Bright red rectal bleeding 12/03/2020   Left hip pain 10/15/2020   Prediabetes 10/15/2020   Chest pain 05/07/2020   Tremor 03/27/2020   Mild cognitive impairment 03/27/2020   Action tremor 01/10/2020   Memory changes 01/10/2020   Primary osteoarthritis of both hips 10/20/2019   Serum calcium elevated 01/22/2019   Epidermal inclusion cyst 08/08/2018   Generalized anxiety disorder 08/03/2018   Elevated blood pressure reading in office without diagnosis of hypertension 08/03/2018   Acute left-sided low back pain with left-sided sciatica 07/02/2018   Tubular adenoma 12/19/2017   Internal and external hemorrhoids without complication 123XX123   Aortic atherosclerosis ([place]) 10/23/2017   Erectile dysfunction due to arterial insufficiency 10/21/2016   Bilateral cataracts 05/18/2016   Hypertensive retinopathy of both eyes 05/18/2016   Dry eye syndrome, bilateral 05/18/2016   Left nephrolithiasis 08/31/2015   Urge incontinence of urine 08/31/2015   Onychomycosis of left great toe 03/19/2015   Chronic pain of both shoulders 12/07/2013   Positive PPD 12/07/2013   Obesity (BMI 30.0-34.9) 12/07/2013   Chronic low back pain 06/12/2013   Chronic constipation 06/12/2013   Healthcare maintenance 03/21/2012   Spastic hemiplegia affecting nondominant side ([place]) 01/26/2012   Osteoarthritis of both knees 01/26/2012   Hyperlipidemia 11/16/2006   History of venous thromboembolism 09/30/2004   History of stroke with residual deficit 08/08/2004    ONSET DATE: 2006  REFERRING DIAG: G81.10 (ICD-10-CM) - Spastic hemiplegia affecting nondominant side  THERAPY DIAG:  Muscle weakness (generalized)  Spastic hemiplegia affecting nondominant side (HCC)  Pain  in left hand  Rationale for Evaluation and Treatment: Rehabilitation  SUBJECTIVE:   SUBJECTIVE STATEMENT: He is unable to afford 2 co-pays in 1 week.   Pt accompanied by: self  PERTINENT HISTORY: per PCP visit 05/05/2022: "QUAVION MCCONNEL is a 67 y.o. male who presents today for his Annual Wellness Visit. He reports consuming a general and african carbohydrate rich  diet. The patient does not participate in regular exercise at present. Concerned about knee  He generally feels fairly well. He reports sleeping well. He does have additional problems to discuss today."   PRECAUTIONS: Fall  WEIGHT BEARING RESTRICTIONS: No  PAIN:  Are you having pain? Yes: NPRS scale: 2-3/10 Pain location: L hand Pain description: aching; swelling Aggravating factors: unsure Relieving factors: unsure  FALLS: Has patient fallen in last 6 months? No and lots of reported LOB  LIVING ENVIRONMENT: Lives with: lives alone Lives in: House/apartment Stairs: Yes: External: 2 steps; on right going up Has following equipment at home: Single point cane and shower chair  PLOF: Independent; managed a convenient store; worked at [employer]; Proofreader, distribution center; played soccer; swimming; Immunologist  PATIENT GOALS: Improve pain and control of LUE  OBJECTIVE:   HAND DOMINANCE: Right  ADLs: Overall ADLs: mod I Eating: requires assistance for cutting  IADLs: Shopping: independent Light housekeeping: requires assistance with vacuuming; cleaning bathroom Meal Prep: setup Community mobility: drives Medication management: independently Financial management: independently  MOBILITY STATUS:  SBA with use of cane  ACTIVITY TOLERANCE: Activity tolerance: fair  UPPER EXTREMITY ROM:     AROM Right (eval) Left (eval)  Shoulder flexion WNL Lacks AROM  Shoulder abduction WNL 80*  Elbow flexion WNL   Elbow extension WNL Lacks AROM  Wrist flexion WNL Lacks AROM  Wrist extension WNL Lacks AROM   Wrist pronation WNL Lacks AROM  Wrist supination WNL Lacks AROM   Digit Composite Flexion WNL Lacks AROM  Digit Composite Extension WNL Lacks AROM  Digit Opposition WNL Lacks AROM  (Blank rows = not tested)  UPPER EXTREMITY MMT:     MMT Right (eval) Left (eval)  Shoulder flexion WNL BFL  Shoulder abduction WNL BFL  Elbow flexion WNL BFL  Elbow extension WNL BFL  (Blank rows = not tested)  HAND FUNCTION: Grip strength n/t due to lack of AROM  COORDINATION: Box and blocks not tested due to lack of AROM and tone  SENSATION: WFL though reports paresthesias in L hand  EDEMA: mild swelling L hand  MUSCLE TONE: LUE: moderate to severe spasticity throughout  COGNITION: Overall cognitive status: Within functional limits for tasks assessed  PERCEPTION: WFL  PRAXIS: WFL  OBSERVATIONS: Pt ambulates with use of cane and altered gait. No LOB. LUE in guarded positioning. Significant tone and flexor synergy.    TODAY'S TREATMENT:                                                                                                                               OT educated pt on LUE stretching as noted in pt instructions.   PATIENT EDUCATION: Education details: LUE stretching Person educated: Patient Education method: Explanation, Demonstration, and Handouts Education comprehension: verbalized understanding, returned demonstration, and needs further education  HOME EXERCISE PROGRAM: 08/29/2022: LUE ROM  GOALS:  SHORT TERM GOALS: Target date: 09/12/2022    Pt will independently recall L edema management.  Baseline: Goal status: INITIAL  2.  Patient will demonstrate initial LUE ROM HEP with 25% verbal cues or less for proper execution. Baseline:  Goal status: INITIAL   LONG TERM GOALS: Target date: 10/28/2022    Patient will demonstrate updated LUE HEP with 25% verbal cues or less for proper execution. Baseline:  Goal status: INITIAL  2.  Pt will be able to place at  least 5 blocks using L hand with completion of Box and Blocks test. Baseline: unable to place any blocks Goal status: INITIAL  3.  Pt will verbalize understanding of LUE splint wearing instructions. Baseline:  Goal status: INITIAL   ASSESSMENT:  CLINICAL IMPRESSION: Pt would benefit from skilled OT services in the outpatient  setting to work on impairments as noted below to help pt return to PLOF as able.    PERFORMANCE DEFICITS: in functional skills including ADLs, IADLs, coordination, edema, tone, ROM, strength, pain, flexibility, Fine motor control, Gross motor control, and UE functional use.   IMPAIRMENTS: are limiting patient from ADLs, IADLs, rest and sleep, and leisure.   CO-MORBIDITIES: may have co-morbidities  that affects occupational performance. Patient will benefit from skilled OT to address above impairments and improve overall function.  REHAB POTENTIAL: Fair given chronicity of stroke   PLAN:  OT FREQUENCY: 1x/week  OT DURATION: 10 weeks  PLANNED INTERVENTIONS: self care/ADL training, therapeutic exercise, therapeutic activity, neuromuscular re-education, manual therapy, passive range of motion, functional mobility training, aquatic therapy, splinting, electrical stimulation, ultrasound, fluidotherapy, moist heat, contrast bath, patient/family education, cognitive remediation/compensation, DME and/or AE instructions, Re-evaluation, and Dry needling  RECOMMENDED OTHER SERVICES: Debate appropriateness for Botox vs if pt is appropriate for Vivistim  CONSULTED AND AGREED WITH PLAN OF CARE: Patient  PLAN FOR NEXT SESSION: review LUE HEP; Vivistim candidate?    Dennis Bast, OT 08/29/2022, 4:52 PM

## 2022-08-30 ENCOUNTER — Ambulatory Visit: Payer: Medicare HMO | Admitting: Podiatry

## 2022-08-31 ENCOUNTER — Telehealth: Payer: Self-pay | Admitting: Internal Medicine

## 2022-08-31 NOTE — Telephone Encounter (Signed)
Please refer to message below.  Attempted to contact patient via telephone, but no answer.  Left detailed message to call the clinic back as soon as possible.

## 2022-08-31 NOTE — Telephone Encounter (Signed)
Call was transferred to me from [facility].  Informed the patient that Dr.Hoffman wanted to schedule an appointment with him tomorrow either in person or via telephone.  Pt agreed to an appointment on 09/01/2022 at 11:45 am for telehealth office visit.  Forwarding message to PCP to make him aware.

## 2022-08-31 NOTE — Telephone Encounter (Signed)
Great, thanks

## 2022-08-31 NOTE — Telephone Encounter (Signed)
-----   Message from [name], DO sent at 08/31/2022  9:38 AM EST ----- Regarding: Add on appointment Alexander English's therapist suggested adding a muscle relaxer to his regimen.  I would like to speak with him about this first, could we see if he could come in tomorrow or have a add on telehealth appointment for me to talk to him about this.

## 2022-09-01 ENCOUNTER — Encounter: Payer: Self-pay | Admitting: Internal Medicine

## 2022-09-01 ENCOUNTER — Ambulatory Visit (INDEPENDENT_AMBULATORY_CARE_PROVIDER_SITE_OTHER): Payer: Medicare HMO | Admitting: Internal Medicine

## 2022-09-01 DIAGNOSIS — J439 Emphysema, unspecified: Secondary | ICD-10-CM | POA: Insufficient documentation

## 2022-09-01 DIAGNOSIS — G8110 Spastic hemiplegia affecting unspecified side: Secondary | ICD-10-CM | POA: Diagnosis not present

## 2022-09-01 DIAGNOSIS — I700 Atherosclerosis of aorta: Secondary | ICD-10-CM

## 2022-09-01 MED ORDER — BACLOFEN 5 MG PO TABS: 1.0000 | ORAL_TABLET | Freq: Three times a day (TID) | ORAL | 2 refills | Status: AC | PRN

## 2022-09-01 NOTE — Assessment & Plan Note (Signed)
I received a message from his occupational therapist noted that he had a lot of spasticity in his upper extremity that was limiting progress with OT.  Discussed with him he has tried tizanidine in the past not sure whether it helped and has tried Botox injections with Dr. Letta Pate he did not think that the Botox injections were helpful.  Willing to try other medication.  Therefore we decided to start with baclofen 5 mg up to 3 times daily discussed this is a low-dose and we may have to increase but I want him to be watching out for any side effects.

## 2022-09-01 NOTE — Assessment & Plan Note (Signed)
I reviewed with him that his low-dose lung CT did reveal emphysema this is destruction of the lung caused by his previous smoking.  He denies any dyspnea on exertion or other acute lung complaint.  I discussed with him we will follow this is an issue to see if he needs additional medication but for right now would be fine to just monitor he is not very active given his spastic hemiplegia following stroke.

## 2022-09-01 NOTE — Progress Notes (Signed)
[facility] Telephone Encounter Continuity Care Appointment  HPI:  This telephone encounter was created for [name] on 09/01/2022 for the following purpose/cc muscle spasticity.   Past Medical History:  Past Medical History:  Diagnosis Date   Aortic atherosclerosis ([place]) 10/23/2017   Asymptomatic, seen on CT scan   Bilateral cataracts 05/18/2016   Not visually significant   Cancer ([place])    maxillary   Cerebrovascular accident (CVA) ([place]) 03/27/2020   Chronic constipation 06/12/2013   Chronic low back pain 06/12/2013   L4-5 facet arthropathy    Chronic pain of both shoulders 12/07/2013   A/C joint osteoarthritis    Cluster headaches    Resolved in 2006   COPD (chronic obstructive pulmonary disease) ([place])    Dry eye syndrome, bilateral [id number]   Embolic stroke involving right middle cerebral artery ([place]) 08/08/2004   Occured after traveling from [place] to [place] where he was hospitalized for 1 month with no further work-up or therapy.  Returned to [place] unable to walk and was admitted to [facility] immediately after landing. Presumed embolic per neuro but TEE, bubble study, and hypercoag W/U negative. On indefinite coumadin per patient's informed preference.  Residual effects : Left spastic hemiplegia. Tx with phenol tibi   Generalized anxiety disorder 08/03/2018   History of kidney stones    Hyperplastic colon polyp 07/27/2012   Excised endoscopically 07/27/2012   Hypertensive retinopathy of both eyes 05/18/2016   Internal and external hemorrhoids without complication 123XX123   Seen on colonoscopy 04/03/2007.   Left nephrolithiasis 08/31/2015   With mild left hydronephrosis.  Scheduled to undergo shockwave lithotripsy.   Obesity (BMI 30.0-34.9) 12/07/2013   Osteoarthritis of both knees 01/26/2012   Positive PPD 12/07/2013   Pulmonary embolism ([place]) 09/30/2004   Diagnosed in April 2006 after returning from [place].  Likely provoked as it  occurred after a 12 hour plane flight within 2 months of R MCA CVA with left hemiparesis. Patient has made an informed decision to remain on lifelong warfarin.    Tubular adenoma 12/19/2017   Endoscopically excised 04/03/2007.   Urge incontinence of urine 08/31/2015   Possibly secondary to prior CVA.  Treated with Myrbetriq.      Assessment / Plan / Recommendations:  Please see A&P under problem oriented charting for assessment of the patient's acute and chronic medical conditions.  As always, pt is advised that if symptoms worsen or new symptoms arise, they should go to an urgent care facility or to to ER for further evaluation.   Consent and Medical Decision Making:   This is a telephone encounter between Alexander English and Alexander English on 09/01/2022 for spasticity. The visit was conducted with the patient located at home and Alexander English at Alexander English. The patient's identity was confirmed using their DOB and current address. The patient has consented to being evaluated through a telephone encounter and understands the associated risks (an examination cannot be done and the patient may need to come in for an appointment) / benefits (allows the patient to remain at home, decreasing exposure to coronavirus). I personally spent 16 minutes on medical discussion.    Spastic hemiplegia affecting nondominant side ([place]) Assessment & Plan: I received a message from his occupational therapist noted that he had a lot of spasticity in his upper extremity that was limiting progress with OT.  Discussed with him he has tried tizanidine in the past not sure whether it helped and has tried Botox injections  with Dr. Letta Pate he did not think that the Botox injections were helpful.  Willing to try other medication.  Therefore we decided to start with baclofen 5 mg up to 3 times daily discussed this is a low-dose and we may have to increase but I want him to be watching out for any side effects.   Aortic  atherosclerosis (HCC) Assessment & Plan: I reviewed his low-dose CT scan again demonstrated aortic atherosclerosis and discussed with him he is on appropriate treatment with aspirin and atorvastatin.   Pulmonary emphysema, unspecified emphysema type ([place]) Assessment & Plan: I reviewed with him that his low-dose lung CT did reveal emphysema this is destruction of the lung caused by his previous smoking.  He denies any dyspnea on exertion or other acute lung complaint.  I discussed with him we will follow this is an issue to see if he needs additional medication but for right now would be fine to just monitor he is not very active given his spastic hemiplegia following stroke.   Other orders -     Baclofen; Take 1 tablet (5 mg total) by mouth 3 (three) times daily as needed (spasticity).  Dispense: 90 tablet; Refill: 2

## 2022-09-01 NOTE — Assessment & Plan Note (Signed)
I reviewed his low-dose CT scan again demonstrated aortic atherosclerosis and discussed with him he is on appropriate treatment with aspirin and atorvastatin.

## 2022-09-02 ENCOUNTER — Ambulatory Visit: Payer: Medicare HMO | Admitting: Physical Therapy

## 2022-09-12 ENCOUNTER — Ambulatory Visit (INDEPENDENT_AMBULATORY_CARE_PROVIDER_SITE_OTHER): Payer: Medicare HMO | Admitting: Pharmacist

## 2022-09-12 DIAGNOSIS — Z86718 Personal history of other venous thrombosis and embolism: Secondary | ICD-10-CM

## 2022-09-12 DIAGNOSIS — Z7901 Long term (current) use of anticoagulants: Secondary | ICD-10-CM

## 2022-09-12 DIAGNOSIS — Z86711 Personal history of pulmonary embolism: Secondary | ICD-10-CM

## 2022-09-12 DIAGNOSIS — I639 Cerebral infarction, unspecified: Secondary | ICD-10-CM

## 2022-09-12 LAB — POCT INR: INR: 2.1 (ref 2.0–3.0)

## 2022-09-12 NOTE — Progress Notes (Signed)
Evaluation and management procedures were performed by the Clinical Pharmacy Practitioner under my supervision and collaboration. I have reviewed the Practitioner's note and chart, and I agree with the management and plan as documented above. ° °

## 2022-09-12 NOTE — Progress Notes (Signed)
Anticoagulation Management Alexander English is a 67 y.o. male who reports to the clinic for monitoring of warfarin treatment.    Indication: Historoy of  CVA, DVT, PE, and long term current use of oral anticoagulation with warfarin to target INR range 2.0 - 3.0.   Duration: indefinite Supervising physician:  Lottie Mussel, MD  Anticoagulation Clinic Visit History: Patient does not report signs/symptoms of bleeding or thromboembolism  Other recent changes: No diet, medications, lifestyle except as noted in patient findings.  Anticoagulation Episode Summary     Current INR goal:  2.0-3.0  TTR:  81.8 % (10.5 y)  Next INR check:  10/10/2022  INR from last check:  2.1 (09/12/2022)  Weekly max warfarin dose:    Target end date:  Indefinite  INR check location:  Anticoagulation Clinic  Preferred lab:    Send INR reminders to:  [facility]   Indications   History of venous thromboembolism [Z86.718]        Comments:           No Known Allergies  Current Outpatient Medications:    aspirin EC 81 MG tablet, Take 81 mg by mouth daily., Disp: , Rfl:    atorvastatin (LIPITOR) 10 MG tablet, Take 1 tablet (10 mg total) by mouth daily., Disp: 90 tablet, Rfl: 3   Baclofen 5 MG TABS, Take 1 tablet (5 mg total) by mouth 3 (three) times daily as needed (spasticity)., Disp: 90 tablet, Rfl: 2   diclofenac Sodium (VOLTAREN ARTHRITIS PAIN) 1 % GEL, Apply 2 g topically 4 (four) times daily., Disp: 50 g, Rfl: 0   fesoterodine (TOVIAZ) 8 MG TB24 tablet, Take 1 tablet (8 mg total) by mouth daily., Disp: 90 tablet, Rfl: 3   mirabegron ER (MYRBETRIQ) 50 MG TB24 tablet, Take 1 tablet (50 mg total) by mouth daily., Disp: 90 tablet, Rfl: 2   Multiple Vitamin (MULTIVITAMIN) tablet, Take 1 tablet by mouth daily., Disp: , Rfl:    propranolol ER (INDERAL LA) 60 MG 24 hr capsule, Take 1 capsule (60 mg total) by mouth daily., Disp: 90 capsule, Rfl: 3   tamsulosin (FLOMAX) 0.4 MG CAPS capsule, Take 0.4 mg by  mouth daily., Disp: , Rfl:    traMADol (ULTRAM) 50 MG tablet, Take 50 mg by mouth every 6 (six) hours as needed., Disp: , Rfl:    warfarin (COUMADIN) 5 MG tablet, Take 1 tablet (5 mg total) by mouth daily at 4 PM., Disp: 180 tablet, Rfl: 0   zolpidem (AMBIEN) 5 MG tablet, Take 1 tablet (5 mg total) by mouth at bedtime as needed for sleep., Disp: 30 tablet, Rfl: 2 Past Medical History:  Diagnosis Date   Aortic atherosclerosis ([place]) 10/23/2017   Asymptomatic, seen on CT scan   Bilateral cataracts 05/18/2016   Not visually significant   Cancer ([place])    maxillary   Cerebrovascular accident (CVA) ([place]) 03/27/2020   Chronic constipation 06/12/2013   Chronic low back pain 06/12/2013   L4-5 facet arthropathy    Chronic pain of both shoulders 12/07/2013   A/C joint osteoarthritis    Cluster headaches    Resolved in 2006   COPD (chronic obstructive pulmonary disease) ([place])    Dry eye syndrome, bilateral [id number]   Embolic stroke involving right middle cerebral artery ([place]) 08/08/2004   Occured after traveling from [place] to [place] where he was hospitalized for 1 month with no further work-up or therapy.  Returned to [place] unable to walk and was admitted to [facility] immediately  after landing. Presumed embolic per neuro but TEE, bubble study, and hypercoag W/U negative. On indefinite coumadin per patient's informed preference.  Residual effects : Left spastic hemiplegia. Tx with phenol tibi   Generalized anxiety disorder 08/03/2018   History of kidney stones    Hyperplastic colon polyp 07/27/2012   Excised endoscopically 07/27/2012   Hypertensive retinopathy of both eyes 05/18/2016   Internal and external hemorrhoids without complication [id number]   Seen on colonoscopy 04/03/2007.   Left nephrolithiasis 08/31/2015   With mild left hydronephrosis.  Scheduled to undergo shockwave lithotripsy.   Obesity (BMI 30.0-34.9) 12/07/2013   Osteoarthritis of both knees 01/26/2012   Positive PPD 12/07/2013    Pulmonary embolism ([place]) 09/30/2004   Diagnosed in April 2006 after returning from [place].  Likely provoked as it occurred after a 12 hour plane flight within 2 months of R MCA CVA with left hemiparesis. Patient has made an informed decision to remain on lifelong warfarin.    Tubular adenoma 12/19/2017   Endoscopically excised 04/03/2007.   Urge incontinence of urine 08/31/2015   Possibly secondary to prior CVA.  Treated with Myrbetriq.   Social History   Socioeconomic History   Marital status: Single    Spouse name: Not on file   Number of children: 3   Years of education: 16   Highest education level: Not on file  Occupational History   Occupation: Retired  Tobacco Use   Smoking status: Former    Types: Cigarettes    Quit date: 06/28/2007    Years since quitting: 15.2   Smokeless tobacco: Never  Vaping Use   Vaping Use: Never used  Substance and Sexual Activity   Alcohol use: Yes    Comment: Rarely.   Drug use: No   Sexual activity: Not on file  Other Topics Concern   Not on file  Social History Narrative   Born in [place] but has been in the [place] for > 30 years.  Divorced, 1 daughter and 2 sons.  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   Right handed   Coffee/tea sometimes, soda rarely   Social Determinants of Health   Financial Resource Strain: Low Risk  (08/15/2022)   Overall Financial Resource Strain (CARDIA)    Difficulty of Paying Living Expenses: Not hard at all  Food Insecurity: No Food Insecurity (08/15/2022)   Hunger Vital Sign    Worried About Running Out of Food in the Last Year: Never true    Ran Out of Food in the Last Year: Never true  Transportation Needs: Unknown (05/05/2022)   PRAPARE - Hydrologist (Medical): No    Lack of Transportation (Non-Medical): Not on file  Physical Activity: Inactive (01/17/2019)   Exercise Vital Sign    Days of Exercise per Week: 0 days    Minutes of  Exercise per Session: 0 min  Stress: No Stress Concern Present (01/17/2019)   [place]    Feeling of Stress : Only a little  Social Connections: Moderately Isolated (08/15/2022)   Social Connection and Isolation Panel [NHANES]    Frequency of Communication with Friends and Family: More than three times a week    Frequency of Social Gatherings with Friends and Family: More than three times a week    Attends Religious Services: More than 4 times per year    Active Member of Clubs or Organizations: No  Attends Archivist Meetings: Never    Marital Status: Divorced   Family History  Problem Relation Age of Onset   Stroke Maternal Grandmother    Unexplained death Mother    Depression Mother        After husband's death   Unexplained death Father    Osteoarthritis Father        Knees   Early death Sister    Seizures Sister    Early death Brother 66       Unknown cause   Healthy Daughter    Healthy Son    Stroke Maternal Aunt    Healthy Sister    Healthy Sister    Unexplained death Brother    Healthy Brother    Healthy Son     ASSESSMENT Recent Results: The most recent result is correlated with 30 mg per week: Lab Results  Component Value Date   INR 2.1 09/12/2022   INR 3.3 (A) 08/15/2022   INR 3.0 07/04/2022    Anticoagulation Dosing: Description   Take one of your 5mg  peach-colored warfarin tablets by mouth, once-daily--EXCEPT on THURSDAYS, take ONLY one-half (1/2) tablet on THURSDAYS.        INR today: Therapeutic  PLAN Weekly dose was increased by 8% to 32.5 mg per week  Patient Instructions  Patient instructed to take medications as defined in the Anti-coagulation Track section of this encounter.  Patient instructed to take today's dose.  Patient instructed to take one-half (1/2) tablet on THURSDAYS.  Patient verbalized understanding of these instructions.  Patient advised to  contact clinic or seek medical attention if signs/symptoms of bleeding or thromboembolism occur.  Patient verbalized understanding by repeating back information and was advised to contact me if further medication-related questions arise. Patient was also provided an information handout.  Follow-up Return in 4 weeks (on 10/10/2022) for Follow up INR.  [name], PharmD, CPP  15 minutes spent face-to-face with the patient during the encounter. 50% of time spent on education, including signs/sx bleeding and clotting, as well as food and drug interactions with warfarin. 50% of time was spent on fingerprick POC INR sample collection,processing, results determination, and documentation in [email or web address].kim.net/.

## 2022-09-12 NOTE — Patient Instructions (Signed)
Patient instructed to take medications as defined in the Anti-coagulation Track section of this encounter.  Patient instructed to take today's dose.  Patient instructed to take one-half (1/2) tablet on THURSDAYS.  Patient verbalized understanding of these instructions.

## 2022-09-16 ENCOUNTER — Ambulatory Visit: Payer: Medicare HMO | Admitting: Occupational Therapy

## 2022-09-19 ENCOUNTER — Ambulatory Visit: Payer: Medicare HMO | Admitting: Physical Therapy

## 2022-09-19 ENCOUNTER — Ambulatory Visit: Payer: Medicare HMO | Admitting: Occupational Therapy

## 2022-09-20 DIAGNOSIS — Z125 Encounter for screening for malignant neoplasm of prostate: Secondary | ICD-10-CM | POA: Diagnosis not present

## 2022-09-20 DIAGNOSIS — N5201 Erectile dysfunction due to arterial insufficiency: Secondary | ICD-10-CM | POA: Diagnosis not present

## 2022-09-20 DIAGNOSIS — N401 Enlarged prostate with lower urinary tract symptoms: Secondary | ICD-10-CM | POA: Diagnosis not present

## 2022-09-20 DIAGNOSIS — R3915 Urgency of urination: Secondary | ICD-10-CM | POA: Diagnosis not present

## 2022-09-26 ENCOUNTER — Ambulatory Visit: Payer: Medicare HMO | Admitting: Rehabilitative and Restorative Service Providers"

## 2022-10-03 ENCOUNTER — Ambulatory Visit: Payer: Medicare HMO | Admitting: Occupational Therapy

## 2022-10-03 ENCOUNTER — Ambulatory Visit: Payer: Medicare HMO | Admitting: Physical Therapy

## 2022-10-07 DIAGNOSIS — C310 Malignant neoplasm of maxillary sinus: Secondary | ICD-10-CM | POA: Diagnosis not present

## 2022-10-07 DIAGNOSIS — M069 Rheumatoid arthritis, unspecified: Secondary | ICD-10-CM | POA: Diagnosis not present

## 2022-10-10 ENCOUNTER — Ambulatory Visit (INDEPENDENT_AMBULATORY_CARE_PROVIDER_SITE_OTHER): Payer: Medicare HMO | Admitting: Pharmacist

## 2022-10-10 ENCOUNTER — Encounter: Payer: Medicare HMO | Admitting: Occupational Therapy

## 2022-10-10 DIAGNOSIS — Z7901 Long term (current) use of anticoagulants: Secondary | ICD-10-CM

## 2022-10-10 DIAGNOSIS — Z86711 Personal history of pulmonary embolism: Secondary | ICD-10-CM | POA: Diagnosis not present

## 2022-10-10 DIAGNOSIS — Z86718 Personal history of other venous thrombosis and embolism: Secondary | ICD-10-CM

## 2022-10-10 LAB — POCT INR: INR: 3 (ref 2.0–3.0)

## 2022-10-10 MED ORDER — WARFARIN SODIUM 5 MG PO TABS: ORAL_TABLET | ORAL | 2 refills | Status: DC

## 2022-10-10 NOTE — Progress Notes (Signed)
Anticoagulation Management Alexander English is a 67 y.o. male who reports to the clinic for monitoring of warfarin treatment.    Indication: DVT and PE , history of; long term current use of oral anticoagulant, warfarin. Target INR range 2.0 - 3.0.  Duration: indefinite Supervising physician: Carlynn Purl  Anticoagulation Clinic Visit History: Patient does not report signs/symptoms of bleeding or thromboembolism  Other recent changes: No diet, medications, lifestyle changes cited by the patient at this visit.  Anticoagulation Episode Summary     Current INR goal:  2.0-3.0  TTR:  81.9 % (10.5 y)  Next INR check:  11/07/2022  INR from last check:  3.0 (10/10/2022)  Weekly max warfarin dose:    Target end date:  Indefinite  INR check location:  Anticoagulation Clinic  Preferred lab:    Send INR reminders to:  [facility]   Indications   History of venous thromboembolism [Z86.718]        Comments:           No Known Allergies  Current Outpatient Medications:    aspirin EC 81 MG tablet, Take 81 mg by mouth daily., Disp: , Rfl:    atorvastatin (LIPITOR) 10 MG tablet, Take 1 tablet (10 mg total) by mouth daily., Disp: 90 tablet, Rfl: 3   Baclofen 5 MG TABS, Take 1 tablet (5 mg total) by mouth 3 (three) times daily as needed (spasticity)., Disp: 90 tablet, Rfl: 2   diclofenac Sodium (VOLTAREN ARTHRITIS PAIN) 1 % GEL, Apply 2 g topically 4 (four) times daily., Disp: 50 g, Rfl: 0   fesoterodine (TOVIAZ) 8 MG TB24 tablet, Take 1 tablet (8 mg total) by mouth daily., Disp: 90 tablet, Rfl: 3   mirabegron ER (MYRBETRIQ) 50 MG TB24 tablet, Take 1 tablet (50 mg total) by mouth daily., Disp: 90 tablet, Rfl: 2   Multiple Vitamin (MULTIVITAMIN) tablet, Take 1 tablet by mouth daily., Disp: , Rfl:    propranolol ER (INDERAL LA) 60 MG 24 hr capsule, Take 1 capsule (60 mg total) by mouth daily., Disp: 90 capsule, Rfl: 3   tamsulosin (FLOMAX) 0.4 MG CAPS capsule, Take 0.4 mg by mouth daily.,  Disp: , Rfl:    traMADol (ULTRAM) 50 MG tablet, Take 50 mg by mouth every 6 (six) hours as needed., Disp: , Rfl:    zolpidem (AMBIEN) 5 MG tablet, Take 1 tablet (5 mg total) by mouth at bedtime as needed for sleep., Disp: 30 tablet, Rfl: 2   warfarin (COUMADIN) 5 MG tablet, Take one-half  (1/2) tablet on Mondays and Thursdays. All other days, take one (1) tablet., Disp: 72 tablet, Rfl: 2 Past Medical History:  Diagnosis Date   Aortic atherosclerosis ([facility]) 10/23/2017   Asymptomatic, seen on CT scan   Bilateral cataracts 05/18/2016   Not visually significant   Cancer ([facility])    maxillary   Cerebrovascular accident (CVA) ([facility]) 03/27/2020   Chronic constipation 06/12/2013   Chronic low back pain 06/12/2013   L4-5 facet arthropathy    Chronic pain of both shoulders 12/07/2013   A/C joint osteoarthritis    Cluster headaches    Resolved in 2006   COPD (chronic obstructive pulmonary disease) ([facility])    Dry eye syndrome, bilateral 05/18/2016   Embolic stroke involving right middle cerebral artery ([facility]) 08/08/2004   Occured after traveling from [place] to [place] where he was hospitalized for 1 month with no further work-up or therapy.  Returned to [place] unable to walk and was admitted to [facility] immediately  after landing. Presumed embolic per neuro but TEE, bubble study, and hypercoag W/U negative. On indefinite coumadin per patient's informed preference.  Residual effects : Left spastic hemiplegia. Tx with phenol tibi   Generalized anxiety disorder 08/03/2018   History of kidney stones    Hyperplastic colon polyp 07/27/2012   Excised endoscopically 07/27/2012   Hypertensive retinopathy of both eyes 05/18/2016   Internal and external hemorrhoids without complication 12/19/2017   Seen on colonoscopy 04/03/2007.   Left nephrolithiasis 08/31/2015   With mild left hydronephrosis.  Scheduled to undergo shockwave lithotripsy.   Obesity (BMI 30.0-34.9) 12/07/2013   Osteoarthritis of both knees 01/26/2012    Positive PPD 12/07/2013   Pulmonary embolism ([facility]) 09/30/2004   Diagnosed in April 2006 after returning from [place].  Likely provoked as it occurred after a 12 hour plane flight within 2 months of R MCA CVA with left hemiparesis. Patient has made an informed decision to remain on lifelong warfarin.    Tubular adenoma 12/19/2017   Endoscopically excised 04/03/2007.   Urge incontinence of urine 08/31/2015   Possibly secondary to prior CVA.  Treated with Myrbetriq.   Social History   Socioeconomic History   Marital status: Single    Spouse name: Not on file   Number of children: 3   Years of education: 16   Highest education level: Not on file  Occupational History   Occupation: Retired  Tobacco Use   Smoking status: Former    Types: Cigarettes    Quit date: 06/28/2007    Years since quitting: 15.2   Smokeless tobacco: Never  Vaping Use   Vaping Use: Never used  Substance and Sexual Activity   Alcohol use: Yes    Comment: Rarely.   Drug use: No   Sexual activity: Not on file  Other Topics Concern   Not on file  Social History Narrative   Born in [place] but has been in the [place] for > 30 years.  Divorced, 1 daughter and 2 sons.  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   Right handed   Coffee/tea sometimes, soda rarely   Social Determinants of Health   Financial Resource Strain: Low Risk  (08/15/2022)   Overall Financial Resource Strain (CARDIA)    Difficulty of Paying Living Expenses: Not hard at all  Food Insecurity: No Food Insecurity (08/15/2022)   Hunger Vital Sign    Worried About Running Out of Food in the Last Year: Never true    Ran Out of Food in the Last Year: Never true  Transportation Needs: Unknown (05/05/2022)   PRAPARE - Administrator, Civil Service (Medical): No    Lack of Transportation (Non-Medical): Not on file  Physical Activity: Inactive (01/17/2019)   Exercise Vital Sign    Days of Exercise per  Week: 0 days    Minutes of Exercise per Session: 0 min  Stress: No Stress Concern Present (01/17/2019)   [employer] of Occupational Health - Occupational Stress Questionnaire    Feeling of Stress : Only a little  Social Connections: Moderately Isolated (08/15/2022)   Social Connection and Isolation Panel [NHANES]    Frequency of Communication with Friends and Family: More than three times a week    Frequency of Social Gatherings with Friends and Family: More than three times a week    Attends Religious Services: More than 4 times per year    Active Member of Clubs or Organizations: No  Attends Banker Meetings: Never    Marital Status: Divorced   Family History  Problem Relation Age of Onset   Stroke Maternal Grandmother    Unexplained death Mother    Depression Mother        After husband's death   Unexplained death Father    Osteoarthritis Father        Knees   Early death Sister    Seizures Sister    Early death Brother 49       Unknown cause   Healthy Daughter    Healthy Son    Stroke Maternal Aunt    Healthy Sister    Healthy Sister    Unexplained death Brother    Healthy Brother    Healthy Son     ASSESSMENT Recent Results: The most recent result is correlated with 32.5 mg per week: Lab Results  Component Value Date   INR 3.0 10/10/2022   INR 2.1 09/12/2022   INR 3.3 (A) 08/15/2022    Anticoagulation Dosing: Description   Take one of your  peach-colored warfarin tablets by mouth, once-daily--EXCEPT on MONDAYS and THURSDAYS, take ONLY one-half (1/2) tablet on MONDAYS and THURSDAYS.        INR today: Therapeutic  PLAN Weekly dose was decreased by 8% to 30 mg per week  Patient Instructions  Patient instructed to take medications as defined in the Anti-coagulation Track section of this encounter.  Patient instructed to take today's dose.  Patient instructed to take one of your  peach-colored warfarin tablets by mouth,  once-daily--EXCEPT on MONDAYS and THURSDAYS, take ONLY one-half (1/2) tablet on MONDAYS and THURSDAYS. Patient verbalized understanding of these instructions.  Patient advised to contact clinic or seek medical attention if signs/symptoms of bleeding or thromboembolism occur.  Patient verbalized understanding by repeating back information and was advised to contact me if further medication-related questions arise. Patient was also provided an information handout.  Follow-up Return in 4 weeks (on 11/07/2022) for Follow up INR.  Elicia Lamp, PharmD, CPP  15 minutes spent face-to-face with the patient during the encounter. 50% of time spent on education, including signs/sx bleeding and clotting, as well as food and drug interactions with warfarin. 50% of time was spent on fingerprick POC INR sample collection,processing, results determination, and documentation in [email or web address].

## 2022-10-10 NOTE — Patient Instructions (Signed)
Patient instructed to take medications as defined in the Anti-coagulation Track section of this encounter.  Patient instructed to take today's dose.  Patient instructed to take one of your 5mg peach-colored warfarin tablets by mouth, once-daily--EXCEPT on MONDAYS and THURSDAYS, take ONLY one-half (1/2) tablet on MONDAYS and THURSDAYS.  Patient verbalized understanding of these instructions.  

## 2022-10-17 ENCOUNTER — Encounter: Payer: Medicare HMO | Admitting: Occupational Therapy

## 2022-10-18 DIAGNOSIS — H40023 Open angle with borderline findings, high risk, bilateral: Secondary | ICD-10-CM | POA: Diagnosis not present

## 2022-10-18 DIAGNOSIS — H25813 Combined forms of age-related cataract, bilateral: Secondary | ICD-10-CM | POA: Diagnosis not present

## 2022-10-18 DIAGNOSIS — H35033 Hypertensive retinopathy, bilateral: Secondary | ICD-10-CM | POA: Diagnosis not present

## 2022-10-18 DIAGNOSIS — H04123 Dry eye syndrome of bilateral lacrimal glands: Secondary | ICD-10-CM | POA: Diagnosis not present

## 2022-10-24 ENCOUNTER — Encounter: Payer: Medicare HMO | Admitting: Occupational Therapy

## 2022-10-27 ENCOUNTER — Other Ambulatory Visit: Payer: Self-pay | Admitting: *Deleted

## 2022-10-27 MED ORDER — ZOLPIDEM TARTRATE 5 MG PO TABS: 5.0000 mg | ORAL_TABLET | Freq: Every evening | ORAL | 2 refills | Status: DC | PRN

## 2022-10-27 NOTE — Telephone Encounter (Signed)
Patient called in requesting refill on zolpidem. Last sent May 2023. States he only takes it prn.

## 2022-10-31 ENCOUNTER — Ambulatory Visit: Payer: Medicare HMO | Admitting: Occupational Therapy

## 2022-11-07 ENCOUNTER — Ambulatory Visit (INDEPENDENT_AMBULATORY_CARE_PROVIDER_SITE_OTHER): Payer: Medicare HMO | Admitting: Pharmacist

## 2022-11-07 ENCOUNTER — Encounter: Payer: Medicare HMO | Admitting: Occupational Therapy

## 2022-11-07 DIAGNOSIS — Z86718 Personal history of other venous thrombosis and embolism: Secondary | ICD-10-CM

## 2022-11-07 DIAGNOSIS — I82409 Acute embolism and thrombosis of unspecified deep veins of unspecified lower extremity: Secondary | ICD-10-CM

## 2022-11-07 DIAGNOSIS — Z86711 Personal history of pulmonary embolism: Secondary | ICD-10-CM

## 2022-11-07 LAB — POCT INR: INR: 2.8 (ref 2.0–3.0)

## 2022-11-07 MED ORDER — WARFARIN SODIUM 5 MG PO TABS: ORAL_TABLET | ORAL | 2 refills | Status: DC

## 2022-11-07 NOTE — Patient Instructions (Signed)
Patient instructed to take medications as defined in the Anti-coagulation Track section of this encounter.  Patient instructed to take today's dose.  Patient instructed to take one of your 5mg peach-colored warfarin tablets by mouth, once-daily--EXCEPT on MONDAYS and THURSDAYS, take ONLY one-half (1/2) tablet on MONDAYS and THURSDAYS.  Patient verbalized understanding of these instructions.  

## 2022-11-07 NOTE — Progress Notes (Signed)
Anticoagulation Management Alexander English is a 67 y.o. male who reports to the clinic for monitoring of warfarin treatment.    Indication: DVT and PE , history of, long term current use of oral anticoagulant, warfarin. Target INR range 2.0 - 3.0. f Duration: indefinite Supervising physician:  Reymundo Poll, MD  Anticoagulation Clinic Visit History: Patient does not report signs/symptoms of bleeding or thromboembolism  Other recent changes: No diet, medications, lifestyle changes identified or cited by the patient at this visit.  Anticoagulation Episode Summary     Current INR goal:  2.0-3.0  TTR:  82.1 % (10.6 y)  Next INR check:  12/05/2022  INR from last check:  2.8 (11/07/2022)  Weekly max warfarin dose:    Target end date:  Indefinite  INR check location:  Anticoagulation Clinic  Preferred lab:    Send INR reminders to:  [facility]   Indications   History of venous thromboembolism [Z86.718]        Comments:           No Known Allergies  Current Outpatient Medications:    aspirin EC 81 MG tablet, Take 81 mg by mouth daily., Disp: , Rfl:    atorvastatin (LIPITOR) 10 MG tablet, Take 1 tablet (10 mg total) by mouth daily., Disp: 90 tablet, Rfl: 3   Baclofen 5 MG TABS, Take 1 tablet (5 mg total) by mouth 3 (three) times daily as needed (spasticity)., Disp: 90 tablet, Rfl: 2   diclofenac Sodium (VOLTAREN ARTHRITIS PAIN) 1 % GEL, Apply 2 g topically 4 (four) times daily., Disp: 50 g, Rfl: 0   fesoterodine (TOVIAZ) 8 MG TB24 tablet, Take 1 tablet (8 mg total) by mouth daily., Disp: 90 tablet, Rfl: 3   mirabegron ER (MYRBETRIQ) 50 MG TB24 tablet, Take 1 tablet (50 mg total) by mouth daily., Disp: 90 tablet, Rfl: 2   Multiple Vitamin (MULTIVITAMIN) tablet, Take 1 tablet by mouth daily., Disp: , Rfl:    propranolol ER (INDERAL LA) 60 MG 24 hr capsule, Take 1 capsule (60 mg total) by mouth daily., Disp: 90 capsule, Rfl: 3   tamsulosin (FLOMAX) 0.4 MG CAPS capsule, Take 0.4  mg by mouth daily., Disp: , Rfl:    traMADol (ULTRAM) 50 MG tablet, Take 50 mg by mouth every 6 (six) hours as needed., Disp: , Rfl:    zolpidem (AMBIEN) 5 MG tablet, Take 1 tablet (5 mg total) by mouth at bedtime as needed for sleep., Disp: 30 tablet, Rfl: 2   warfarin (COUMADIN) 5 MG tablet, Take one-half  (1/2) tablet on Mondays and Thursdays. All other days, take one (1) tablet., Disp: 72 tablet, Rfl: 2 Past Medical History:  Diagnosis Date   Aortic atherosclerosis (HCC) 10/23/2017   Asymptomatic, seen on CT scan   Bilateral cataracts 05/18/2016   Not visually significant   Cancer (HCC)    maxillary   Cerebrovascular accident (CVA) (HCC) 03/27/2020   Chronic constipation 06/12/2013   Chronic low back pain 06/12/2013   L4-5 facet arthropathy    Chronic pain of both shoulders 12/07/2013   A/C joint osteoarthritis    Cluster headaches    Resolved in 2006   COPD (chronic obstructive pulmonary disease) (HCC)    Dry eye syndrome, bilateral 05/18/2016   Embolic stroke involving right middle cerebral artery (HCC) 08/08/2004   Occured after traveling from [place] to [place] where he was hospitalized for 1 month with no further work-up or therapy.  Returned to [place] unable to walk and was  admitted to [facility] immediately after landing. Presumed embolic per neuro but TEE, bubble study, and hypercoag W/U negative. On indefinite coumadin per patient's informed preference.  Residual effects : Left spastic hemiplegia. Tx with phenol tibi   Generalized anxiety disorder 08/03/2018   History of kidney stones    Hyperplastic colon polyp 07/27/2012   Excised endoscopically 07/27/2012   Hypertensive retinopathy of both eyes 05/18/2016   Internal and external hemorrhoids without complication 12/19/2017   Seen on colonoscopy 04/03/2007.   Left nephrolithiasis 08/31/2015   With mild left hydronephrosis.  Scheduled to undergo shockwave lithotripsy.   Obesity (BMI 30.0-34.9) 12/07/2013   Osteoarthritis of both knees  01/26/2012   Positive PPD 12/07/2013   Pulmonary embolism (HCC) 09/30/2004   Diagnosed in April 2006 after returning from [place].  Likely provoked as it occurred after a 12 hour plane flight within 2 months of R MCA CVA with left hemiparesis. Patient has made an informed decision to remain on lifelong warfarin.    Tubular adenoma 12/19/2017   Endoscopically excised 04/03/2007.   Urge incontinence of urine 08/31/2015   Possibly secondary to prior CVA.  Treated with Myrbetriq.   Social History   Socioeconomic History   Marital status: Single    Spouse name: Not on file   Number of children: 3   Years of education: 16   Highest education level: Not on file  Occupational History   Occupation: Retired  Tobacco Use   Smoking status: Former    Types: Cigarettes    Quit date: 06/28/2007    Years since quitting: 15.3   Smokeless tobacco: Never  Vaping Use   Vaping Use: Never used  Substance and Sexual Activity   Alcohol use: Yes    Comment: Rarely.   Drug use: No   Sexual activity: Not on file  Other Topics Concern   Not on file  Social History Narrative   Born in [place] but has been in the [place] for > 30 years.  Divorced, 1 daughter and 2 sons.  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   Right handed   Coffee/tea sometimes, soda rarely   Social Determinants of Health   Financial Resource Strain: Low Risk  (08/15/2022)   Overall Financial Resource Strain (CARDIA)    Difficulty of Paying Living Expenses: Not hard at all  Food Insecurity: No Food Insecurity (08/15/2022)   Hunger Vital Sign    Worried About Running Out of Food in the Last Year: Never true    Ran Out of Food in the Last Year: Never true  Transportation Needs: Unknown (05/05/2022)   PRAPARE - Administrator, Civil Service (Medical): No    Lack of Transportation (Non-Medical): Not on file  Physical Activity: Inactive (01/17/2019)   Exercise Vital Sign    Days of  Exercise per Week: 0 days    Minutes of Exercise per Session: 0 min  Stress: No Stress Concern Present (01/17/2019)   [employer] of Occupational Health - Occupational Stress Questionnaire    Feeling of Stress : Only a little  Social Connections: Moderately Isolated (08/15/2022)   Social Connection and Isolation Panel [NHANES]    Frequency of Communication with Friends and Family: More than three times a week    Frequency of Social Gatherings with Friends and Family: More than three times a week    Attends Religious Services: More than 4 times per year    Active Member of [employer]  or Organizations: No    Attends Banker Meetings: Never    Marital Status: Divorced   Family History  Problem Relation Age of Onset   Stroke Maternal Grandmother    Unexplained death Mother    Depression Mother        After husband's death   Unexplained death Father    Osteoarthritis Father        Knees   Early death Sister    Seizures Sister    Early death Brother 72       Unknown cause   Healthy Daughter    Healthy Son    Stroke Maternal Aunt    Healthy Sister    Healthy Sister    Unexplained death Brother    Healthy Brother    Healthy Son     ASSESSMENT Recent Results: The most recent result is correlated with 30 mg per week: Lab Results  Component Value Date   INR 2.8 11/07/2022   INR 3.0 10/10/2022   INR 2.1 09/12/2022    Anticoagulation Dosing: Description   Take one of your 5mg  peach-colored warfarin tablets by mouth, once-daily--EXCEPT on MONDAYS and THURSDAYS, take ONLY one-half (1/2) tablet on MONDAYS and THURSDAYS.        INR today: Therapeutic  PLAN Weekly dose was unchanged. Continue 1/2 x 5mg  tablet on Mondays and Thursdays, all other days, take one (1) of your 5mg  strength peach colored warfarin tablets.  Patient Instructions  Patient instructed to take medications as defined in the Anti-coagulation Track section of this encounter.  Patient instructed  to take today's dose.  Patient instructed to take one of your 5mg  peach-colored warfarin tablets by mouth, once-daily--EXCEPT on MONDAYS and THURSDAYS, take ONLY one-half (1/2) tablet on MONDAYS and THURSDAYS.  Patient verbalized understanding of these instructions.  Patient advised to contact clinic or seek medical attention if signs/symptoms of bleeding or thromboembolism occur.  Patient verbalized understanding by repeating back information and was advised to contact me if further medication-related questions arise. Patient was also provided an information handout.  Follow-up Return in 4 weeks (on 12/05/2022) for Follow up INR.  Elicia Lamp, PharmD, CPP  15 minutes spent face-to-face with the patient during the encounter. 50% of time spent on education, including signs/sx bleeding and clotting, as well as food and drug interactions with warfarin. 50% of time was spent on fingerprick POC INR sample collection,processing, results determination, and documentation in [email or web address].

## 2022-11-24 DIAGNOSIS — M21372 Foot drop, left foot: Secondary | ICD-10-CM | POA: Diagnosis not present

## 2022-11-24 DIAGNOSIS — M2352 Chronic instability of knee, left knee: Secondary | ICD-10-CM | POA: Diagnosis not present

## 2022-12-05 ENCOUNTER — Ambulatory Visit (INDEPENDENT_AMBULATORY_CARE_PROVIDER_SITE_OTHER): Payer: Medicare HMO | Admitting: Pharmacist

## 2022-12-05 DIAGNOSIS — Z86718 Personal history of other venous thrombosis and embolism: Secondary | ICD-10-CM

## 2022-12-05 DIAGNOSIS — I82409 Acute embolism and thrombosis of unspecified deep veins of unspecified lower extremity: Secondary | ICD-10-CM

## 2022-12-05 DIAGNOSIS — Z7901 Long term (current) use of anticoagulants: Secondary | ICD-10-CM

## 2022-12-05 DIAGNOSIS — Z86711 Personal history of pulmonary embolism: Secondary | ICD-10-CM | POA: Diagnosis not present

## 2022-12-05 DIAGNOSIS — I639 Cerebral infarction, unspecified: Secondary | ICD-10-CM

## 2022-12-05 LAB — POCT INR: INR: 2.5 (ref 2.0–3.0)

## 2022-12-05 NOTE — Progress Notes (Signed)
Evaluation and management procedures were performed by the Clinical Pharmacy Practitioner under my supervision and collaboration. I have reviewed the Practitioner's note and chart, and I agree with the management and plan as documented above. ° °

## 2022-12-05 NOTE — Patient Instructions (Signed)
Patient instructed to take medications as defined in the Anti-coagulation Track section of this encounter.  Patient instructed to take today's dose.  Patient instructed to take one of your 5mg peach-colored warfarin tablets by mouth, once-daily--EXCEPT on MONDAYS and THURSDAYS, take ONLY one-half (1/2) tablet on MONDAYS and THURSDAYS.  Patient verbalized understanding of these instructions.  

## 2022-12-05 NOTE — Progress Notes (Signed)
Anticoagulation Management Alexander English is a 67 y.o. male who reports to the clinic for monitoring of warfarin treatment.    Indication: CVA and PE , history of; long term current use of oral anticoagulant, warfarin. Target INR range 2.0 - 3.0.  Duration: indefinite Supervising physician:  Mercie Eon, MD  Anticoagulation Clinic Visit History: Patient does not report signs/symptoms of bleeding or thromboembolism  Other recent changes: No diet, medications, lifestyle changes identified by the patient at this visit.  Anticoagulation Episode Summary     Current INR goal:  2.0-3.0  TTR:  82.2 % (10.7 y)  Next INR check:  01/09/2023  INR from last check:  2.5 (12/05/2022)  Weekly max warfarin dose:    Target end date:  Indefinite  INR check location:  Anticoagulation Clinic  Preferred lab:    Send INR reminders to:  [facility]   Indications   History of venous thromboembolism [Z86.718]        Comments:           No Known Allergies  Current Outpatient Medications:    aspirin EC 81 MG tablet, Take 81 mg by mouth daily., Disp: , Rfl:    atorvastatin (LIPITOR) 10 MG tablet, Take 1 tablet (10 mg total) by mouth daily., Disp: 90 tablet, Rfl: 3   Baclofen 5 MG TABS, Take 1 tablet (5 mg total) by mouth 3 (three) times daily as needed (spasticity)., Disp: 90 tablet, Rfl: 2   diclofenac Sodium (VOLTAREN ARTHRITIS PAIN) 1 % GEL, Apply 2 g topically 4 (four) times daily., Disp: 50 g, Rfl: 0   fesoterodine (TOVIAZ) 8 MG TB24 tablet, Take 1 tablet (8 mg total) by mouth daily., Disp: 90 tablet, Rfl: 3   mirabegron ER (MYRBETRIQ) 50 MG TB24 tablet, Take 1 tablet (50 mg total) by mouth daily., Disp: 90 tablet, Rfl: 2   Multiple Vitamin (MULTIVITAMIN) tablet, Take 1 tablet by mouth daily., Disp: , Rfl:    propranolol ER (INDERAL LA) 60 MG 24 hr capsule, Take 1 capsule (60 mg total) by mouth daily., Disp: 90 capsule, Rfl: 3   tamsulosin (FLOMAX) 0.4 MG CAPS capsule, Take 0.4 mg by mouth  daily., Disp: , Rfl:    traMADol (ULTRAM) 50 MG tablet, Take 50 mg by mouth every 6 (six) hours as needed., Disp: , Rfl:    warfarin (COUMADIN) 5 MG tablet, Take one-half  (1/2) tablet on Mondays and Thursdays. All other days, take one (1) tablet., Disp: 72 tablet, Rfl: 2   zolpidem (AMBIEN) 5 MG tablet, Take 1 tablet (5 mg total) by mouth at bedtime as needed for sleep., Disp: 30 tablet, Rfl: 2 Past Medical History:  Diagnosis Date   Aortic atherosclerosis (HCC) 10/23/2017   Asymptomatic, seen on CT scan   Bilateral cataracts 05/18/2016   Not visually significant   Cancer (HCC)    maxillary   Cerebrovascular accident (CVA) (HCC) 03/27/2020   Chronic constipation 06/12/2013   Chronic low back pain 06/12/2013   L4-5 facet arthropathy    Chronic pain of both shoulders 12/07/2013   A/C joint osteoarthritis    Cluster headaches    Resolved in 2006   COPD (chronic obstructive pulmonary disease) (HCC)    Dry eye syndrome, bilateral 05/18/2016   Embolic stroke involving right middle cerebral artery (HCC) 08/08/2004   Occured after traveling from [place] to [place] where he was hospitalized for 1 month with no further work-up or therapy.  Returned to [place] unable to walk and was admitted to  [facility] immediately after landing. Presumed embolic per neuro but TEE, bubble study, and hypercoag W/U negative. On indefinite coumadin per patient's informed preference.  Residual effects : Left spastic hemiplegia. Tx with phenol tibi   Generalized anxiety disorder 08/03/2018   History of kidney stones    Hyperplastic colon polyp 07/27/2012   Excised endoscopically 07/27/2012   Hypertensive retinopathy of both eyes 05/18/2016   Internal and external hemorrhoids without complication 12/19/2017   Seen on colonoscopy 04/03/2007.   Left nephrolithiasis 08/31/2015   With mild left hydronephrosis.  Scheduled to undergo shockwave lithotripsy.   Obesity (BMI 30.0-34.9) 12/07/2013   Osteoarthritis of both knees 01/26/2012    Positive PPD 12/07/2013   Pulmonary embolism (HCC) 09/30/2004   Diagnosed in April 2006 after returning from [place].  Likely provoked as it occurred after a 12 hour plane flight within 2 months of R MCA CVA with left hemiparesis. Patient has made an informed decision to remain on lifelong warfarin.    Tubular adenoma 12/19/2017   Endoscopically excised 04/03/2007.   Urge incontinence of urine 08/31/2015   Possibly secondary to prior CVA.  Treated with Myrbetriq.   Social History   Socioeconomic History   Marital status: Single    Spouse name: Not on file   Number of children: 3   Years of education: 16   Highest education level: Not on file  Occupational History   Occupation: Retired  Tobacco Use   Smoking status: Former    Types: Cigarettes    Quit date: 06/28/2007    Years since quitting: 15.4   Smokeless tobacco: Never  Vaping Use   Vaping Use: Never used  Substance and Sexual Activity   Alcohol use: Yes    Comment: Rarely.   Drug use: No   Sexual activity: Not on file  Other Topics Concern   Not on file  Social History Narrative   Born in [place] but has been in the [place] for > 30 years.  Divorced, 1 daughter and 2 sons.  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   Right handed   Coffee/tea sometimes, soda rarely   Social Determinants of Health   Financial Resource Strain: Low Risk  (08/15/2022)   Overall Financial Resource Strain (CARDIA)    Difficulty of Paying Living Expenses: Not hard at all  Food Insecurity: No Food Insecurity (08/15/2022)   Hunger Vital Sign    Worried About Running Out of Food in the Last Year: Never true    Ran Out of Food in the Last Year: Never true  Transportation Needs: Unknown (05/05/2022)   PRAPARE - Administrator, Civil Service (Medical): No    Lack of Transportation (Non-Medical): Not on file  Physical Activity: Inactive (01/17/2019)   Exercise Vital Sign    Days of Exercise per  Week: 0 days    Minutes of Exercise per Session: 0 min  Stress: No Stress Concern Present (01/17/2019)   [employer] of Occupational Health - Occupational Stress Questionnaire    Feeling of Stress : Only a little  Social Connections: Moderately Isolated (08/15/2022)   Social Connection and Isolation Panel [NHANES]    Frequency of Communication with Friends and Family: More than three times a week    Frequency of Social Gatherings with Friends and Family: More than three times a week    Attends Religious Services: More than 4 times per year    Active Member of [employer] or Organizations:  No    Attends Banker Meetings: Never    Marital Status: Divorced   Family History  Problem Relation Age of Onset   Stroke Maternal Grandmother    Unexplained death Mother    Depression Mother        After husband's death   Unexplained death Father    Osteoarthritis Father        Knees   Early death Sister    Seizures Sister    Early death Brother 31       Unknown cause   Healthy Daughter    Healthy Son    Stroke Maternal Aunt    Healthy Sister    Healthy Sister    Unexplained death Brother    Healthy Brother    Healthy Son     ASSESSMENT Recent Results: The most recent result is correlated with 30 mg per week: Lab Results  Component Value Date   INR 2.5 12/05/2022   INR 2.8 11/07/2022   INR 3.0 10/10/2022    Anticoagulation Dosing: Description   Take one of your 5mg  peach-colored warfarin tablets by mouth, once-daily--EXCEPT on MONDAYS and THURSDAYS, take ONLY one-half (1/2) tablet on MONDAYS and THURSDAYS.        INR today: Therapeutic  PLAN Weekly dose was unchanged.   Patient Instructions  Patient instructed to take medications as defined in the Anti-coagulation Track section of this encounter.  Patient instructed to take today's dose.  Patient instructed to take one of your 5mg  peach-colored warfarin tablets by mouth, once-daily--EXCEPT on MONDAYS and  THURSDAYS, take ONLY one-half (1/2) tablet on MONDAYS and THURSDAYS.  Patient verbalized understanding of these instructions.  Patient advised to contact clinic or seek medical attention if signs/symptoms of bleeding or thromboembolism occur.  Patient verbalized understanding by repeating back information and was advised to contact me if further medication-related questions arise. Patient was also provided an information handout.  Follow-up Return in 5 weeks (on 01/09/2023) for Follow up INR.  Elicia Lamp, PharmD, CPP  15 minutes spent face-to-face with the patient during the encounter. 50% of time spent on education, including signs/sx bleeding and clotting, as well as food and drug interactions with warfarin. 50% of time was spent on fingerprick POC INR sample collection,processing, results determination, and documentation in [email or web address].

## 2022-12-22 ENCOUNTER — Ambulatory Visit: Payer: Medicare HMO | Admitting: Podiatry

## 2022-12-22 DIAGNOSIS — L603 Nail dystrophy: Secondary | ICD-10-CM | POA: Diagnosis not present

## 2022-12-22 MED ORDER — CICLOPIROX 8 % EX SOLN: Freq: Every day | CUTANEOUS | 0 refills | Status: AC

## 2022-12-22 NOTE — Progress Notes (Signed)
Subjective:  Patient ID: Alexander English, male    DOB: 1955-08-17,  MRN: [id number]  Chief Complaint  Patient presents with   Nail Problem    67 y.o. male presents with the above complaint.  Patient presents with right hallux nail dystrophy.  Patient states pain for touch is progressive and worse is mild discomfort.  He does not want to have it removed.  He would just like to discuss slant back procedure.  He denies any other acute complaints.  He has a history of ingrown removed in the past   Review of Systems: Negative except as noted in the HPI. Denies N/V/F/Ch.  Past Medical History:  Diagnosis Date   Aortic atherosclerosis (HCC) 10/23/2017   Asymptomatic, seen on CT scan   Bilateral cataracts 05/18/2016   Not visually significant   Cancer (HCC)    maxillary   Cerebrovascular accident (CVA) (HCC) 03/27/2020   Chronic constipation 06/12/2013   Chronic low back pain 06/12/2013   L4-5 facet arthropathy    Chronic pain of both shoulders 12/07/2013   A/C joint osteoarthritis    Cluster headaches    Resolved in 2006   COPD (chronic obstructive pulmonary disease) (HCC)    Dry eye syndrome, bilateral 05/18/2016   Embolic stroke involving right middle cerebral artery (HCC) 08/08/2004   Occured after traveling from [place] to [place] where he was hospitalized for 1 month with no further work-up or therapy.  Returned to [place] unable to walk and was admitted to [facility] immediately after landing. Presumed embolic per neuro but TEE, bubble study, and hypercoag W/U negative. On indefinite coumadin per patient's informed preference.  Residual effects : Left spastic hemiplegia. Tx with phenol tibi   Generalized anxiety disorder 08/03/2018   History of kidney stones    Hyperplastic colon polyp 07/27/2012   Excised endoscopically 07/27/2012   Hypertensive retinopathy of both eyes 05/18/2016   Internal and external hemorrhoids without complication 12/19/2017   Seen on colonoscopy 04/03/2007.   Left  nephrolithiasis 08/31/2015   With mild left hydronephrosis.  Scheduled to undergo shockwave lithotripsy.   Obesity (BMI 30.0-34.9) 12/07/2013   Osteoarthritis of both knees 01/26/2012   Positive PPD 12/07/2013   Pulmonary embolism (HCC) 09/30/2004   Diagnosed in April 2006 after returning from [place].  Likely provoked as it occurred after a 12 hour plane flight within 2 months of R MCA CVA with left hemiparesis. Patient has made an informed decision to remain on lifelong warfarin.    Tubular adenoma 12/19/2017   Endoscopically excised 04/03/2007.   Urge incontinence of urine 08/31/2015   Possibly secondary to prior CVA.  Treated with Myrbetriq.    Current Outpatient Medications:    ciclopirox (PENLAC) 8 % solution, Apply topically at bedtime. Apply over nail and surrounding skin. Apply daily over previous coat. After seven (7) days, may remove with alcohol and continue cycle., Disp: 6.6 mL, Rfl: 0   aspirin EC 81 MG tablet, Take 81 mg by mouth daily., Disp: , Rfl:    atorvastatin (LIPITOR) 10 MG tablet, Take 1 tablet (10 mg total) by mouth daily., Disp: 90 tablet, Rfl: 3   Baclofen 5 MG TABS, Take 1 tablet (5 mg total) by mouth 3 (three) times daily as needed (spasticity)., Disp: 90 tablet, Rfl: 2   diclofenac Sodium (VOLTAREN ARTHRITIS PAIN) 1 % GEL, Apply 2 g topically 4 (four) times daily., Disp: 50 g, Rfl: 0   fesoterodine (TOVIAZ) 8 MG TB24 tablet, Take 1 tablet (8 mg total) by mouth  daily., Disp: 90 tablet, Rfl: 3   mirabegron ER (MYRBETRIQ) 50 MG TB24 tablet, Take 1 tablet (50 mg total) by mouth daily., Disp: 90 tablet, Rfl: 2   Multiple Vitamin (MULTIVITAMIN) tablet, Take 1 tablet by mouth daily., Disp: , Rfl:    propranolol ER (INDERAL LA) 60 MG 24 hr capsule, Take 1 capsule (60 mg total) by mouth daily., Disp: 90 capsule, Rfl: 3   tamsulosin (FLOMAX) 0.4 MG CAPS capsule, Take 0.4 mg by mouth daily., Disp: , Rfl:    traMADol (ULTRAM) 50 MG tablet, Take 50 mg by mouth every 6 (six)  hours as needed., Disp: , Rfl:    warfarin (COUMADIN) 5 MG tablet, Take one-half  (1/2) tablet on Mondays and Thursdays. All other days, take one (1) tablet., Disp: 72 tablet, Rfl: 2   zolpidem (AMBIEN) 5 MG tablet, Take 1 tablet (5 mg total) by mouth at bedtime as needed for sleep., Disp: 30 tablet, Rfl: 2  Social History   Tobacco Use  Smoking Status Former   Types: Cigarettes   Quit date: 06/28/2007   Years since quitting: 15.5  Smokeless Tobacco Never    No Known Allergies Objective:  There were no vitals filed for this visit. There is no height or weight on file to calculate BMI. Constitutional Well developed. Well nourished.  Vascular Dorsalis pedis pulses palpable bilaterally. Posterior tibial pulses palpable bilaterally. Capillary refill normal to all digits.  No cyanosis or clubbing noted. Pedal hair growth normal.  Neurologic Normal speech. Oriented to person, place, and time. Epicritic sensation to light touch grossly present bilaterally.  Dermatologic Thickened elongated dystrophic mycotic toenails right hallux with ingrown noted.  No signs of infection noted.  Orthopedic: Normal joint ROM without pain or crepitus bilaterally. No visible deformities. No bony tenderness.   Radiographs: None Assessment:   1. Nail dystrophy    Plan:  Patient was evaluated and treated and all questions answered.  Right hallux nail dystrophy -All questions or concerns were discussed with the patient in extensive detail given the amount of ingrown left present I believe patient will benefit from slant back procedure to give him some temporary relief.  He will think about having the nail removed in the future.  Slant back procedure was performed in standard technique with nail nipper.  No complication noted  No follow-ups on file.

## 2023-01-09 ENCOUNTER — Ambulatory Visit: Payer: Medicare HMO | Admitting: Pharmacist

## 2023-01-09 DIAGNOSIS — Z86711 Personal history of pulmonary embolism: Secondary | ICD-10-CM

## 2023-01-09 DIAGNOSIS — Z7901 Long term (current) use of anticoagulants: Secondary | ICD-10-CM | POA: Diagnosis not present

## 2023-01-09 DIAGNOSIS — Z86718 Personal history of other venous thrombosis and embolism: Secondary | ICD-10-CM

## 2023-01-09 LAB — POCT INR: INR: 3 (ref 2.0–3.0)

## 2023-01-09 NOTE — Progress Notes (Signed)
Anticoagulation Management Alexander English is a 67 y.o. male who reports to the clinic for monitoring of warfarin treatment.    Indication: DVT and PE, History of; long term current use of oral anticoagulant, warfarin. Target INR range 2.0 - 3.0. Duration: indefinite Supervising physician: Carlynn Purl  Anticoagulation Clinic Visit History: Patient does not report signs/symptoms of bleeding or thromboembolism  Other recent changes: No diet, medications, lifestyle changes.  Anticoagulation Episode Summary     Current INR goal:  2.0-3.0  TTR:  82.3% (10.8 y)  Next INR check:  02/06/2023  INR from last check:  3.0 (01/09/2023)  Weekly max warfarin dose:  --  Target end date:  Indefinite  INR check location:  Anticoagulation Clinic  Preferred lab:  --  Send INR reminders to:  [facility]   Indications   History of venous thromboembolism [Z86.718]        Comments:  --         No Known Allergies  Current Outpatient Medications:    aspirin EC 81 MG tablet, Take 81 mg by mouth daily., Disp: , Rfl:    atorvastatin (LIPITOR) 10 MG tablet, Take 1 tablet (10 mg total) by mouth daily., Disp: 90 tablet, Rfl: 3   Baclofen 5 MG TABS, Take 1 tablet (5 mg total) by mouth 3 (three) times daily as needed (spasticity)., Disp: 90 tablet, Rfl: 2   ciclopirox (PENLAC) 8 % solution, Apply topically at bedtime. Apply over nail and surrounding skin. Apply daily over previous coat. After seven (7) days, may remove with alcohol and continue cycle., Disp: 6.6 mL, Rfl: 0   diclofenac Sodium (VOLTAREN ARTHRITIS PAIN) 1 % GEL, Apply 2 g topically 4 (four) times daily., Disp: 50 g, Rfl: 0   fesoterodine (TOVIAZ) 8 MG TB24 tablet, Take 1 tablet (8 mg total) by mouth daily., Disp: 90 tablet, Rfl: 3   mirabegron ER (MYRBETRIQ) 50 MG TB24 tablet, Take 1 tablet (50 mg total) by mouth daily., Disp: 90 tablet, Rfl: 2   Multiple Vitamin (MULTIVITAMIN) tablet, Take 1 tablet by mouth daily., Disp: , Rfl:     propranolol ER (INDERAL LA) 60 MG 24 hr capsule, Take 1 capsule (60 mg total) by mouth daily., Disp: 90 capsule, Rfl: 3   tamsulosin (FLOMAX) 0.4 MG CAPS capsule, Take 0.4 mg by mouth daily., Disp: , Rfl:    traMADol (ULTRAM) 50 MG tablet, Take 50 mg by mouth every 6 (six) hours as needed., Disp: , Rfl:    warfarin (COUMADIN) 5 MG tablet, Take one-half  (1/2) tablet on Mondays and Thursdays. All other days, take one (1) tablet., Disp: 72 tablet, Rfl: 2   zolpidem (AMBIEN) 5 MG tablet, Take 1 tablet (5 mg total) by mouth at bedtime as needed for sleep., Disp: 30 tablet, Rfl: 2 Past Medical History:  Diagnosis Date   Aortic atherosclerosis (HCC) 10/23/2017   Asymptomatic, seen on CT scan   Bilateral cataracts 05/18/2016   Not visually significant   Cancer (HCC)    maxillary   Cerebrovascular accident (CVA) (HCC) 03/27/2020   Chronic constipation 06/12/2013   Chronic low back pain 06/12/2013   L4-5 facet arthropathy    Chronic pain of both shoulders 12/07/2013   A/C joint osteoarthritis    Cluster headaches    Resolved in 2006   COPD (chronic obstructive pulmonary disease) (HCC)    Dry eye syndrome, bilateral 05/18/2016   Embolic stroke involving right middle cerebral artery (HCC) 08/08/2004   Occured after traveling from [place]  to [place] where he was hospitalized for 1 month with no further work-up or therapy.  Returned to [place] unable to walk and was admitted to [facility] immediately after landing. Presumed embolic per neuro but TEE, bubble study, and hypercoag W/U negative. On indefinite coumadin per patient's informed preference.  Residual effects : Left spastic hemiplegia. Tx with phenol tibi   Generalized anxiety disorder 08/03/2018   History of kidney stones    Hyperplastic colon polyp 07/27/2012   Excised endoscopically 07/27/2012   Hypertensive retinopathy of both eyes 05/18/2016   Internal and external hemorrhoids without complication 12/19/2017   Seen on colonoscopy 04/03/2007.   Left  nephrolithiasis 08/31/2015   With mild left hydronephrosis.  Scheduled to undergo shockwave lithotripsy.   Obesity (BMI 30.0-34.9) 12/07/2013   Osteoarthritis of both knees 01/26/2012   Positive PPD 12/07/2013   Pulmonary embolism (HCC) 09/30/2004   Diagnosed in April 2006 after returning from [place].  Likely provoked as it occurred after a 12 hour plane flight within 2 months of R MCA CVA with left hemiparesis. Patient has made an informed decision to remain on lifelong warfarin.    Tubular adenoma 12/19/2017   Endoscopically excised 04/03/2007.   Urge incontinence of urine 08/31/2015   Possibly secondary to prior CVA.  Treated with Myrbetriq.   Social History   Socioeconomic History   Marital status: Single    Spouse name: Not on file   Number of children: 3   Years of education: 16   Highest education level: Not on file  Occupational History   Occupation: Retired  Tobacco Use   Smoking status: Former    Current packs/day: 0.00    Types: Cigarettes    Quit date: 06/28/2007    Years since quitting: 15.5   Smokeless tobacco: Never  Vaping Use   Vaping status: Never Used  Substance and Sexual Activity   Alcohol use: Yes    Comment: Rarely.   Drug use: No   Sexual activity: Not on file  Other Topics Concern   Not on file  Social History Narrative   Born in [place] but has been in the [place] for > 30 years.  Divorced, 1 daughter and 2 sons.  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   Right handed   Coffee/tea sometimes, soda rarely   Social Determinants of Health   Financial Resource Strain: Low Risk  (08/15/2022)   Overall Financial Resource Strain (CARDIA)    Difficulty of Paying Living Expenses: Not hard at all  Food Insecurity: Low Risk  (10/07/2022)   [facility]   Food vital sign    Within the past 12 months, you worried that your food would run out before you got money to buy more: Never true     Within the past 12 months, the food you bought just didn't last and you didn't have money to get more. : Never true  Transportation Needs: Not on file (10/07/2022)  Physical Activity: Inactive (01/17/2019)   Exercise Vital Sign    Days of Exercise per Week: 0 days    Minutes of Exercise per Session: 0 min  Stress: No Stress Concern Present (01/17/2019)   [employer] of Occupational Health - Occupational Stress Questionnaire    Feeling of Stress : Only a little  Social Connections: Moderately Isolated (08/15/2022)   Social Connection and Isolation Panel [NHANES]    Frequency of Communication with Friends and Family: More than  three times a week    Frequency of Social Gatherings with Friends and Family: More than three times a week    Attends Religious Services: More than 4 times per year    Active Member of [employer] or Organizations: No    Attends Engineer, structural: Never    Marital Status: Divorced   Family History  Problem Relation Age of Onset   Stroke Maternal Grandmother    Unexplained death Mother    Depression Mother        After husband's death   Unexplained death Father    Osteoarthritis Father        Knees   Early death Sister    Seizures Sister    Early death Brother 65       Unknown cause   Healthy Daughter    Healthy Son    Stroke Maternal Aunt    Healthy Sister    Healthy Sister    Unexplained death Brother    Healthy Brother    Healthy Son     ASSESSMENT Recent Results: The most recent result is correlated with 30 mg per week: Lab Results  Component Value Date   INR 3.0 01/09/2023   INR 2.5 12/05/2022   INR 2.8 11/07/2022    Anticoagulation Dosing: Description   Take one of your 5mg  peach-colored warfarin tablets by mouth, once-daily--EXCEPT on MONDAYS, WEDNESDAYS and FRIDAYS, take ONLY one-half (1/2) tablet on MONDAYS, WEDNESDAYS and FRIDAYS.      INR today: Therapeutic  PLAN Weekly dose was decreased by 8% to 27.5 mg per  week  Patient Instructions  Patient instructed to take medications as defined in the Anti-coagulation Track section of this encounter.  Patient instructed to take today's dose.  Patient instructed to take one of your 5mg  peach-colored warfarin tablets by mouth, once-daily--EXCEPT on MONDAYS, WEDNESDAYS and FRIDAYS, take ONLY one-half (1/2) tablet on MONDAYS, WEDNESDAYS and FRIDAYS.  Patient verbalized understanding of these instructions.  Patient advised to contact clinic or seek medical attention if signs/symptoms of bleeding or thromboembolism occur.  Patient verbalized understanding by repeating back information and was advised to contact me if further medication-related questions arise. Patient was also provided an information handout.  Follow-up Return in 4 weeks (on 02/06/2023) for Follow up INR.  Elicia Lamp, PharmD, CPP  15 minutes spent face-to-face with the patient during the encounter. 50% of time spent on education, including signs/sx bleeding and clotting, as well as food and drug interactions with warfarin. 50% of time was spent on fingerprick POC INR sample collection,processing, results determination, and documentation in [email or web address].

## 2023-01-09 NOTE — Patient Instructions (Signed)
Patient instructed to take medications as defined in the Anti-coagulation Track section of this encounter.  Patient instructed to take today's dose.  Patient instructed to take one of your 5mg  peach-colored warfarin tablets by mouth, once-daily--EXCEPT on MONDAYS, WEDNESDAYS and FRIDAYS, take ONLY one-half (1/2) tablet on MONDAYS, WEDNESDAYS and FRIDAYS.  Patient verbalized understanding of these instructions.

## 2023-02-06 ENCOUNTER — Ambulatory Visit (INDEPENDENT_AMBULATORY_CARE_PROVIDER_SITE_OTHER): Payer: Medicare HMO | Admitting: Pharmacist

## 2023-02-06 DIAGNOSIS — Z86711 Personal history of pulmonary embolism: Secondary | ICD-10-CM | POA: Diagnosis not present

## 2023-02-06 DIAGNOSIS — Z7901 Long term (current) use of anticoagulants: Secondary | ICD-10-CM

## 2023-02-06 DIAGNOSIS — Z5181 Encounter for therapeutic drug level monitoring: Secondary | ICD-10-CM

## 2023-02-06 DIAGNOSIS — Z86718 Personal history of other venous thrombosis and embolism: Secondary | ICD-10-CM | POA: Diagnosis not present

## 2023-02-06 LAB — POCT INR: INR: 2.9 (ref 2.0–3.0)

## 2023-02-06 MED ORDER — WARFARIN SODIUM 5 MG PO TABS
ORAL_TABLET | ORAL | 3 refills | Status: DC
Start: 2023-02-06 — End: 2023-02-06

## 2023-02-06 MED ORDER — WARFARIN SODIUM 5 MG PO TABS
ORAL_TABLET | ORAL | 3 refills | Status: DC
Start: 2023-02-06 — End: 2023-05-01

## 2023-02-06 NOTE — Progress Notes (Signed)
Anticoagulation Management Alexander English is a 67 y.o. male who reports to the clinic for monitoring of warfarin treatment.    Indication: DVT , PE--history of; long term current use of oral anticoagulant warfarin with target INR range 2.0 - 3.0. Duration: indefinite Supervising physician: Debe Coder  Anticoagulation Clinic Visit History: Patient does not report signs/symptoms of bleeding or thromboembolism  Other recent changes: No diet, medications, lifestyle changes.  Anticoagulation Episode Summary     Current INR goal:  2.0-3.0  TTR:  82.5% (10.9 y)  Next INR check:  03/06/2023  INR from last check:  2.9 (02/06/2023)  Weekly max warfarin dose:  --  Target end date:  Indefinite  INR check location:  Anticoagulation Clinic  Preferred lab:  --  Send INR reminders to:  [facility]   Indications   History of venous thromboembolism [Z86.718]        Comments:  --         No Known Allergies  Current Outpatient Medications:    aspirin EC 81 MG tablet, Take 81 mg by mouth daily., Disp: , Rfl:    atorvastatin (LIPITOR) 10 MG tablet, Take 1 tablet (10 mg total) by mouth daily., Disp: 90 tablet, Rfl: 3   Baclofen 5 MG TABS, Take 1 tablet (5 mg total) by mouth 3 (three) times daily as needed (spasticity)., Disp: 90 tablet, Rfl: 2   ciclopirox (PENLAC) 8 % solution, Apply topically at bedtime. Apply over nail and surrounding skin. Apply daily over previous coat. After seven (7) days, may remove with alcohol and continue cycle., Disp: 6.6 mL, Rfl: 0   diclofenac Sodium (VOLTAREN ARTHRITIS PAIN) 1 % GEL, Apply 2 g topically 4 (four) times daily., Disp: 50 g, Rfl: 0   fesoterodine (TOVIAZ) 8 MG TB24 tablet, Take 1 tablet (8 mg total) by mouth daily., Disp: 90 tablet, Rfl: 3   mirabegron ER (MYRBETRIQ) 50 MG TB24 tablet, Take 1 tablet (50 mg total) by mouth daily., Disp: 90 tablet, Rfl: 2   Multiple Vitamin (MULTIVITAMIN) tablet, Take 1 tablet by mouth daily., Disp: , Rfl:     propranolol ER (INDERAL LA) 60 MG 24 hr capsule, Take 1 capsule (60 mg total) by mouth daily., Disp: 90 capsule, Rfl: 3   tamsulosin (FLOMAX) 0.4 MG CAPS capsule, Take 0.4 mg by mouth daily., Disp: , Rfl:    traMADol (ULTRAM) 50 MG tablet, Take 50 mg by mouth every 6 (six) hours as needed., Disp: , Rfl:    zolpidem (AMBIEN) 5 MG tablet, Take 1 tablet (5 mg total) by mouth at bedtime as needed for sleep., Disp: 30 tablet, Rfl: 2   warfarin (COUMADIN) 5 MG tablet, Take one-half  (1/2) tablet on Mondays, Wednesdays and Fridays. All other days, take one (1) tablet., Disp: 72 tablet, Rfl: 3 Past Medical History:  Diagnosis Date   Aortic atherosclerosis (HCC) 10/23/2017   Asymptomatic, seen on CT scan   Bilateral cataracts 05/18/2016   Not visually significant   Cancer (HCC)    maxillary   Cerebrovascular accident (CVA) (HCC) 03/27/2020   Chronic constipation 06/12/2013   Chronic low back pain 06/12/2013   L4-5 facet arthropathy    Chronic pain of both shoulders 12/07/2013   A/C joint osteoarthritis    Cluster headaches    Resolved in 2006   COPD (chronic obstructive pulmonary disease) (HCC)    Dry eye syndrome, bilateral 05/18/2016   Embolic stroke involving right middle cerebral artery (HCC) 08/08/2004   Occured after traveling from  [place] to [place] where he was hospitalized for 1 month with no further work-up or therapy.  Returned to [place] unable to walk and was admitted to [facility] immediately after landing. Presumed embolic per neuro but TEE, bubble study, and hypercoag W/U negative. On indefinite coumadin per patient's informed preference.  Residual effects : Left spastic hemiplegia. Tx with phenol tibi   Generalized anxiety disorder 08/03/2018   History of kidney stones    Hyperplastic colon polyp 07/27/2012   Excised endoscopically 07/27/2012   Hypertensive retinopathy of both eyes 05/18/2016   Internal and external hemorrhoids without complication 12/19/2017   Seen on colonoscopy 04/03/2007.    Left nephrolithiasis 08/31/2015   With mild left hydronephrosis.  Scheduled to undergo shockwave lithotripsy.   Obesity (BMI 30.0-34.9) 12/07/2013   Osteoarthritis of both knees 01/26/2012   Positive PPD 12/07/2013   Pulmonary embolism (HCC) 09/30/2004   Diagnosed in April 2006 after returning from [place].  Likely provoked as it occurred after a 12 hour plane flight within 2 months of R MCA CVA with left hemiparesis. Patient has made an informed decision to remain on lifelong warfarin.    Tubular adenoma 12/19/2017   Endoscopically excised 04/03/2007.   Urge incontinence of urine 08/31/2015   Possibly secondary to prior CVA.  Treated with Myrbetriq.   Social History   Socioeconomic History   Marital status: Single    Spouse name: Not on file   Number of children: 3   Years of education: 16   Highest education level: Not on file  Occupational History   Occupation: Retired  Tobacco Use   Smoking status: Former    Current packs/day: 0.00    Types: Cigarettes    Quit date: 06/28/2007    Years since quitting: 15.6   Smokeless tobacco: Never  Vaping Use   Vaping status: Never Used  Substance and Sexual Activity   Alcohol use: Yes    Comment: Rarely.   Drug use: No   Sexual activity: Not on file  Other Topics Concern   Not on file  Social History Narrative   Born in [place] but has been in the [place] for > 30 years.  Divorced, 1 daughter and 2 sons.  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   Right handed   Coffee/tea sometimes, soda rarely   Social Determinants of Health   Financial Resource Strain: Low Risk  (08/15/2022)   Overall Financial Resource Strain (CARDIA)    Difficulty of Paying Living Expenses: Not hard at all  Food Insecurity: Low Risk  (10/07/2022)   [facility]   Food vital sign    Within the past 12 months, you worried that your food would run out before you got money to buy more: Never  true    Within the past 12 months, the food you bought just didn't last and you didn't have money to get more. : Never true  Transportation Needs: Not on file (10/07/2022)  Physical Activity: Inactive (01/17/2019)   Exercise Vital Sign    Days of Exercise per Week: 0 days    Minutes of Exercise per Session: 0 min  Stress: No Stress Concern Present (01/17/2019)   [employer] of Occupational Health - Occupational Stress Questionnaire    Feeling of Stress : Only a little  Social Connections: Moderately Isolated (08/15/2022)   Social Connection and Isolation Panel [NHANES]    Frequency of Communication with Friends and Family: More  than three times a week    Frequency of Social Gatherings with Friends and Family: More than three times a week    Attends Religious Services: More than 4 times per year    Active Member of [employer] or Organizations: No    Attends Engineer, structural: Never    Marital Status: Divorced   Family History  Problem Relation Age of Onset   Stroke Maternal Grandmother    Unexplained death Mother    Depression Mother        After husband's death   Unexplained death Father    Osteoarthritis Father        Knees   Early death Sister    Seizures Sister    Early death Brother 62       Unknown cause   Healthy Daughter    Healthy Son    Stroke Maternal Aunt    Healthy Sister    Healthy Sister    Unexplained death Brother    Healthy Brother    Healthy Son     ASSESSMENT Recent Results: The most recent result is correlated with 27.5 mg per week: Lab Results  Component Value Date   INR 2.9 02/06/2023   INR 3.0 01/09/2023   INR 2.5 12/05/2022    Anticoagulation Dosing: Description   Take one of your 5mg  peach-colored warfarin tablets by mouth, once-daily--EXCEPT on MONDAYS, WEDNESDAYS and FRIDAYS, take ONLY one-half (1/2) tablet on MONDAYS, WEDNESDAYS and FRIDAYS.      INR today: Therapeutic  PLAN Weekly dose was unchanged.   Patient  Instructions  Patient instructed to take medications as defined in the Anti-coagulation Track section of this encounter.  Patient instructed to take today's dose.  Patient instructed to take one of your 5mg  peach-colored warfarin tablets by mouth, once-daily--EXCEPT on MONDAYS, WEDNESDAYS and FRIDAYS, take ONLY one-half (1/2) tablet on MONDAYS, WEDNESDAYS and FRIDAYS.  Patient verbalized understanding of these instructions.  Patient advised to contact clinic or seek medical attention if signs/symptoms of bleeding or thromboembolism occur.  Patient verbalized understanding by repeating back information and was advised to contact me if further medication-related questions arise. Patient was also provided an information handout.  Follow-up Return in 4 weeks (on 03/06/2023) for Follow up INR.  Elicia Lamp, PharmD, CPP  15 minutes spent face-to-face with the patient during the encounter. 50% of time spent on education, including signs/sx bleeding and clotting, as well as food and drug interactions with warfarin. 50% of time was spent on fingerprick POC INR sample collection,processing, results determination, and documentation in [email or web address].

## 2023-02-06 NOTE — Patient Instructions (Signed)
Patient instructed to take medications as defined in the Anti-coagulation Track section of this encounter.  Patient instructed to take today's dose.  Patient instructed to take one of your 5mg  peach-colored warfarin tablets by mouth, once-daily--EXCEPT on MONDAYS, WEDNESDAYS and FRIDAYS, take ONLY one-half (1/2) tablet on MONDAYS, WEDNESDAYS and FRIDAYS.  Patient verbalized understanding of these instructions.

## 2023-03-06 ENCOUNTER — Ambulatory Visit: Payer: Medicare HMO | Admitting: Pharmacist

## 2023-03-06 DIAGNOSIS — I639 Cerebral infarction, unspecified: Secondary | ICD-10-CM

## 2023-03-06 DIAGNOSIS — Z7901 Long term (current) use of anticoagulants: Secondary | ICD-10-CM

## 2023-03-06 DIAGNOSIS — Z86711 Personal history of pulmonary embolism: Secondary | ICD-10-CM | POA: Diagnosis not present

## 2023-03-06 DIAGNOSIS — Z86718 Personal history of other venous thrombosis and embolism: Secondary | ICD-10-CM

## 2023-03-06 LAB — POCT INR: INR: 2.5 (ref 2.0–3.0)

## 2023-03-06 NOTE — Progress Notes (Signed)
Anticoagulation Management Alexander English is a 67 y.o. male who reports to the clinic for monitoring of warfarin treatment.    Indication: CVA, DVT, and PE, History of all of these; long term current use of oral anticoagulant warfarin, target INR range 2.0 - 3.0.  Duration: indefinite Supervising physician: Carlynn Purl  Anticoagulation Clinic Visit History: Patient does not report signs/symptoms of bleeding or thromboembolism  Other recent changes: No diet, medications, lifestyle changes at this visit.  Anticoagulation Episode Summary     Current INR goal:  2.0-3.0  TTR:  82.6% (10.9 y)  Next INR check:  04/03/2023  INR from last check:  2.5 (03/06/2023)  Weekly max warfarin dose:  --  Target end date:  Indefinite  INR check location:  Anticoagulation Clinic  Preferred lab:  --  Send INR reminders to:  [facility]   Indications   History of venous thromboembolism [Z86.718]        Comments:  --         No Known Allergies  Current Outpatient Medications:    aspirin EC 81 MG tablet, Take 81 mg by mouth daily., Disp: , Rfl:    atorvastatin (LIPITOR) 10 MG tablet, Take 1 tablet (10 mg total) by mouth daily., Disp: 90 tablet, Rfl: 3   Baclofen 5 MG TABS, Take 1 tablet (5 mg total) by mouth 3 (three) times daily as needed (spasticity)., Disp: 90 tablet, Rfl: 2   ciclopirox (PENLAC) 8 % solution, Apply topically at bedtime. Apply over nail and surrounding skin. Apply daily over previous coat. After seven (7) days, may remove with alcohol and continue cycle., Disp: 6.6 mL, Rfl: 0   diclofenac Sodium (VOLTAREN ARTHRITIS PAIN) 1 % GEL, Apply 2 g topically 4 (four) times daily., Disp: 50 g, Rfl: 0   fesoterodine (TOVIAZ) 8 MG TB24 tablet, Take 1 tablet (8 mg total) by mouth daily., Disp: 90 tablet, Rfl: 3   mirabegron ER (MYRBETRIQ) 50 MG TB24 tablet, Take 1 tablet (50 mg total) by mouth daily., Disp: 90 tablet, Rfl: 2   Multiple Vitamin (MULTIVITAMIN) tablet, Take 1 tablet by  mouth daily., Disp: , Rfl:    propranolol ER (INDERAL LA) 60 MG 24 hr capsule, Take 1 capsule (60 mg total) by mouth daily., Disp: 90 capsule, Rfl: 3   tamsulosin (FLOMAX) 0.4 MG CAPS capsule, Take 0.4 mg by mouth daily., Disp: , Rfl:    traMADol (ULTRAM) 50 MG tablet, Take 50 mg by mouth every 6 (six) hours as needed., Disp: , Rfl:    warfarin (COUMADIN) 5 MG tablet, Take one-half  (1/2) tablet on Mondays, Wednesdays and Fridays. All other days, take one (1) tablet., Disp: 72 tablet, Rfl: 3   zolpidem (AMBIEN) 5 MG tablet, Take 1 tablet (5 mg total) by mouth at bedtime as needed for sleep., Disp: 30 tablet, Rfl: 2 Past Medical History:  Diagnosis Date   Aortic atherosclerosis (HCC) 10/23/2017   Asymptomatic, seen on CT scan   Bilateral cataracts 05/18/2016   Not visually significant   Cancer (HCC)    maxillary   Cerebrovascular accident (CVA) (HCC) 03/27/2020   Chronic constipation 06/12/2013   Chronic low back pain 06/12/2013   L4-5 facet arthropathy    Chronic pain of both shoulders 12/07/2013   A/C joint osteoarthritis    Cluster headaches    Resolved in 2006   COPD (chronic obstructive pulmonary disease) (HCC)    Dry eye syndrome, bilateral 05/18/2016   Embolic stroke involving right middle cerebral artery (  HCC) 08/08/2004   Occured after traveling from [place] to [place] where he was hospitalized for 1 month with no further work-up or therapy.  Returned to [place] unable to walk and was admitted to [facility] immediately after landing. Presumed embolic per neuro but TEE, bubble study, and hypercoag W/U negative. On indefinite coumadin per patient's informed preference.  Residual effects : Left spastic hemiplegia. Tx with phenol tibi   Generalized anxiety disorder 08/03/2018   History of kidney stones    Hyperplastic colon polyp 07/27/2012   Excised endoscopically 07/27/2012   Hypertensive retinopathy of both eyes 05/18/2016   Internal and external hemorrhoids without complication 12/19/2017    Seen on colonoscopy 04/03/2007.   Left nephrolithiasis 08/31/2015   With mild left hydronephrosis.  Scheduled to undergo shockwave lithotripsy.   Obesity (BMI 30.0-34.9) 12/07/2013   Osteoarthritis of both knees 01/26/2012   Positive PPD 12/07/2013   Pulmonary embolism (HCC) 09/30/2004   Diagnosed in April 2006 after returning from [place].  Likely provoked as it occurred after a 12 hour plane flight within 2 months of R MCA CVA with left hemiparesis. Patient has made an informed decision to remain on lifelong warfarin.    Tubular adenoma 12/19/2017   Endoscopically excised 04/03/2007.   Urge incontinence of urine 08/31/2015   Possibly secondary to prior CVA.  Treated with Myrbetriq.   Social History   Socioeconomic History   Marital status: Single    Spouse name: Not on file   Number of children: 3   Years of education: 16   Highest education level: Not on file  Occupational History   Occupation: Retired  Tobacco Use   Smoking status: Former    Current packs/day: 0.00    Types: Cigarettes    Quit date: 06/28/2007    Years since quitting: 15.6   Smokeless tobacco: Never  Vaping Use   Vaping status: Never Used  Substance and Sexual Activity   Alcohol use: Yes    Comment: Rarely.   Drug use: No   Sexual activity: Not on file  Other Topics Concern   Not on file  Social History Narrative   Born in [place] but has been in the [place] for > 30 years.  Divorced, 1 daughter and 2 sons.  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   Right handed   Coffee/tea sometimes, soda rarely   Social Determinants of Health   Financial Resource Strain: Low Risk  (08/15/2022)   Overall Financial Resource Strain (CARDIA)    Difficulty of Paying Living Expenses: Not hard at all  Food Insecurity: Low Risk  (10/07/2022)   [facility]   Food vital sign    Within the past 12 months, you worried that your food would run out before  you got money to buy more: Never true    Within the past 12 months, the food you bought just didn't last and you didn't have money to get more. : Never true  Transportation Needs: Not on file (10/07/2022)  Physical Activity: Inactive (01/17/2019)   Exercise Vital Sign    Days of Exercise per Week: 0 days    Minutes of Exercise per Session: 0 min  Stress: No Stress Concern Present (01/17/2019)   [employer] of Occupational Health - Occupational Stress Questionnaire    Feeling of Stress : Only a little  Social Connections: Moderately Isolated (08/15/2022)   Social Connection and Isolation Panel [NHANES]  Frequency of Communication with Friends and Family: More than three times a week    Frequency of Social Gatherings with Friends and Family: More than three times a week    Attends Religious Services: More than 4 times per year    Active Member of [employer] or Organizations: No    Attends Engineer, structural: Never    Marital Status: Divorced   Family History  Problem Relation Age of Onset   Stroke Maternal Grandmother    Unexplained death Mother    Depression Mother        After husband's death   Unexplained death Father    Osteoarthritis Father        Knees   Early death Sister    Seizures Sister    Early death Brother 14       Unknown cause   Healthy Daughter    Healthy Son    Stroke Maternal Aunt    Healthy Sister    Healthy Sister    Unexplained death Brother    Healthy Brother    Healthy Son     ASSESSMENT Recent Results: The most recent result is correlated with 27.5 mg per week: Lab Results  Component Value Date   INR 2.5 03/06/2023   INR 2.9 02/06/2023   INR 3.0 01/09/2023    Anticoagulation Dosing: Description   Take one of your 5mg  peach-colored warfarin tablets by mouth, once-daily--EXCEPT on MONDAYS, WEDNESDAYS and FRIDAYS, take ONLY one-half (1/2) tablet on MONDAYS, WEDNESDAYS and FRIDAYS.      INR today: Therapeutic  PLAN Weekly dose  was unchanged. Continue taking 27.5 mg warfarin per week as 5mg  every day except on M/W/F--take only 1/2 x 5 mg = 2.5mg .  Patient Instructions  Patient instructed to take medications as defined in the Anti-coagulation Track section of this encounter.  Patient instructed to take today's dose.  Patient instructed to take one of your 5mg  peach-colored warfarin tablets by mouth, once-daily--EXCEPT on MONDAYS, WEDNESDAYS and FRIDAYS, take ONLY one-half (1/2) tablet on MONDAYS, WEDNESDAYS and FRIDAYS.  Patient verbalized understanding of these instructions.  Patient advised to contact clinic or seek medical attention if signs/symptoms of bleeding or thromboembolism occur.  Patient verbalized understanding by repeating back information and was advised to contact me if further medication-related questions arise. Patient was also provided an information handout.  Follow-up Return in 4 weeks (on 04/03/2023) for Follow up INR.  Elicia Lamp, PharmD, CPP  15 minutes spent face-to-face with the patient during the encounter. 50% of time spent on education, including signs/sx bleeding and clotting, as well as food and drug interactions with warfarin. 50% of time was spent on fingerprick POC INR sample collection,processing, results determination, and documentation in [email or web address].

## 2023-03-06 NOTE — Patient Instructions (Signed)
Patient instructed to take medications as defined in the Anti-coagulation Track section of this encounter.  Patient instructed to take today's dose.  Patient instructed to take one of your 5mg  peach-colored warfarin tablets by mouth, once-daily--EXCEPT on MONDAYS, WEDNESDAYS and FRIDAYS, take ONLY one-half (1/2) tablet on MONDAYS, WEDNESDAYS and FRIDAYS.  Patient verbalized understanding of these instructions.

## 2023-04-03 ENCOUNTER — Ambulatory Visit: Payer: Medicare HMO | Admitting: Pharmacist

## 2023-04-03 DIAGNOSIS — Z86718 Personal history of other venous thrombosis and embolism: Secondary | ICD-10-CM | POA: Diagnosis not present

## 2023-04-03 DIAGNOSIS — Z7901 Long term (current) use of anticoagulants: Secondary | ICD-10-CM | POA: Diagnosis not present

## 2023-04-03 DIAGNOSIS — I639 Cerebral infarction, unspecified: Secondary | ICD-10-CM | POA: Diagnosis not present

## 2023-04-03 DIAGNOSIS — Z86711 Personal history of pulmonary embolism: Secondary | ICD-10-CM

## 2023-04-03 LAB — POCT INR: INR: 2 (ref 2.0–3.0)

## 2023-04-03 NOTE — Patient Instructions (Signed)
Patient instructed to take medications as defined in the Anti-coagulation Track section of this encounter.  Patient instructed to take today's dose.  Patient instructed to take one of your 5mg  peach-colored warfarin tablets by mouth, once-daily--EXCEPT on [facility]. Take ONLY one-half (1/2) tablet on [facility]. Patient verbalized understanding of these instructions.

## 2023-04-03 NOTE — Progress Notes (Signed)
Anticoagulation Management Alexander English is a 67 y.o. male who reports to the clinic for monitoring of warfarin treatment.    Indication: CVA, DVT, and PE , History of; Long term current use of oral anticoagulation with warfarin, INR target range 2.0 - 3.0.  Duration: indefinite Supervising physician: Debe Coder  Anticoagulation Clinic Visit History: Patient does not report signs/symptoms of bleeding or thromboembolism  Other recent changes: No diet, medications, lifestyle changes.  Anticoagulation Episode Summary     Current INR goal:  2.0-3.0  TTR:  82.7% (11 y)  Next INR check:  05/01/2023  INR from last check:  2.0 (04/03/2023)  Weekly max warfarin dose:  --  Target end date:  Indefinite  INR check location:  Anticoagulation Clinic  Preferred lab:  --  Send INR reminders to:  [facility]   Indications   History of venous thromboembolism [Z86.718]        Comments:  --         No Known Allergies  Current Outpatient Medications:    aspirin EC 81 MG tablet, Take 81 mg by mouth daily., Disp: , Rfl:    atorvastatin (LIPITOR) 10 MG tablet, Take 1 tablet (10 mg total) by mouth daily., Disp: 90 tablet, Rfl: 3   Baclofen 5 MG TABS, Take 1 tablet (5 mg total) by mouth 3 (three) times daily as needed (spasticity)., Disp: 90 tablet, Rfl: 2   ciclopirox (PENLAC) 8 % solution, Apply topically at bedtime. Apply over nail and surrounding skin. Apply daily over previous coat. After seven (7) days, may remove with alcohol and continue cycle., Disp: 6.6 mL, Rfl: 0   diclofenac Sodium (VOLTAREN ARTHRITIS PAIN) 1 % GEL, Apply 2 g topically 4 (four) times daily., Disp: 50 g, Rfl: 0   fesoterodine (TOVIAZ) 8 MG TB24 tablet, Take 1 tablet (8 mg total) by mouth daily., Disp: 90 tablet, Rfl: 3   mirabegron ER (MYRBETRIQ) 50 MG TB24 tablet, Take 1 tablet (50 mg total) by mouth daily., Disp: 90 tablet, Rfl: 2   Multiple Vitamin (MULTIVITAMIN) tablet, Take 1 tablet by mouth daily., Disp: ,  Rfl:    propranolol ER (INDERAL LA) 60 MG 24 hr capsule, Take 1 capsule (60 mg total) by mouth daily., Disp: 90 capsule, Rfl: 3   tamsulosin (FLOMAX) 0.4 MG CAPS capsule, Take 0.4 mg by mouth daily., Disp: , Rfl:    traMADol (ULTRAM) 50 MG tablet, Take 50 mg by mouth every 6 (six) hours as needed., Disp: , Rfl:    warfarin (COUMADIN) 5 MG tablet, Take one-half  (1/2) tablet on Mondays, Wednesdays and Fridays. All other days, take one (1) tablet., Disp: 72 tablet, Rfl: 3   zolpidem (AMBIEN) 5 MG tablet, Take 1 tablet (5 mg total) by mouth at bedtime as needed for sleep., Disp: 30 tablet, Rfl: 2 Past Medical History:  Diagnosis Date   Aortic atherosclerosis (HCC) 10/23/2017   Asymptomatic, seen on CT scan   Bilateral cataracts 05/18/2016   Not visually significant   Cancer (HCC)    maxillary   Cerebrovascular accident (CVA) (HCC) 03/27/2020   Chronic constipation 06/12/2013   Chronic low back pain 06/12/2013   L4-5 facet arthropathy    Chronic pain of both shoulders 12/07/2013   A/C joint osteoarthritis    Cluster headaches    Resolved in 2006   COPD (chronic obstructive pulmonary disease) (HCC)    Dry eye syndrome, bilateral 05/18/2016   Embolic stroke involving right middle cerebral artery (HCC) 08/08/2004  Occured after traveling from [place] to [place] where he was hospitalized for 1 month with no further work-up or therapy.  Returned to [place] unable to walk and was admitted to [facility] immediately after landing. Presumed embolic per neuro but TEE, bubble study, and hypercoag W/U negative. On indefinite coumadin per patient's informed preference.  Residual effects : Left spastic hemiplegia. Tx with phenol tibi   Generalized anxiety disorder 08/03/2018   History of kidney stones    Hyperplastic colon polyp 07/27/2012   Excised endoscopically 07/27/2012   Hypertensive retinopathy of both eyes 05/18/2016   Internal and external hemorrhoids without complication 12/19/2017   Seen on colonoscopy  04/03/2007.   Left nephrolithiasis 08/31/2015   With mild left hydronephrosis.  Scheduled to undergo shockwave lithotripsy.   Obesity (BMI 30.0-34.9) 12/07/2013   Osteoarthritis of both knees 01/26/2012   Positive PPD 12/07/2013   Pulmonary embolism (HCC) 09/30/2004   Diagnosed in April 2006 after returning from [place].  Likely provoked as it occurred after a 12 hour plane flight within 2 months of R MCA CVA with left hemiparesis. Patient has made an informed decision to remain on lifelong warfarin.    Tubular adenoma 12/19/2017   Endoscopically excised 04/03/2007.   Urge incontinence of urine 08/31/2015   Possibly secondary to prior CVA.  Treated with Myrbetriq.   Social History   Socioeconomic History   Marital status: Single    Spouse name: Not on file   Number of children: 3   Years of education: 16   Highest education level: Not on file  Occupational History   Occupation: Retired  Tobacco Use   Smoking status: Former    Current packs/day: 0.00    Types: Cigarettes    Quit date: 06/28/2007    Years since quitting: 15.7   Smokeless tobacco: Never  Vaping Use   Vaping status: Never Used  Substance and Sexual Activity   Alcohol use: Yes    Comment: Rarely.   Drug use: No   Sexual activity: Not on file  Other Topics Concern   Not on file  Social History Narrative   Born in [place] but has been in the [place] for > 30 years.  Divorced, 1 daughter and 2 sons.  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   Right handed   Coffee/tea sometimes, soda rarely   Social Determinants of Health   Financial Resource Strain: Low Risk  (08/15/2022)   Overall Financial Resource Strain (CARDIA)    Difficulty of Paying Living Expenses: Not hard at all  Food Insecurity: Low Risk  (10/07/2022)   [facility]   Hunger Vital Sign    Worried About Running Out of Food in the Last Year: Never true    Ran Out of Food in the  Last Year: Never true  Transportation Needs: Not on file (10/07/2022)  Physical Activity: Inactive (01/17/2019)   Exercise Vital Sign    Days of Exercise per Week: 0 days    Minutes of Exercise per Session: 0 min  Stress: No Stress Concern Present (01/17/2019)   [employer] of Occupational Health - Occupational Stress Questionnaire    Feeling of Stress : Only a little  Social Connections: Moderately Isolated (08/15/2022)   Social Connection and Isolation Panel [NHANES]    Frequency of Communication with Friends and Family: More than three times a week    Frequency of Social Gatherings with Friends and Family: More than three  times a week    Attends Religious Services: More than 4 times per year    Active Member of Clubs or Organizations: No    Attends Banker Meetings: Never    Marital Status: Divorced   Family History  Problem Relation Age of Onset   Stroke Maternal Grandmother    Unexplained death Mother    Depression Mother        After husband's death   Unexplained death Father    Osteoarthritis Father        Knees   Early death Sister    Seizures Sister    Early death Brother 48       Unknown cause   Healthy Daughter    Healthy Son    Stroke Maternal Aunt    Healthy Sister    Healthy Sister    Unexplained death Brother    Healthy Brother    Healthy Son     ASSESSMENT Recent Results: The most recent result is correlated with 27.5 mg per week: Lab Results  Component Value Date   INR 2.0 04/03/2023   INR 2.5 03/06/2023   INR 2.9 02/06/2023    Anticoagulation Dosing: Description   Take one of your 5mg  peach-colored warfarin tablets by mouth, once-daily--EXCEPT on [facility]. Take ONLY one-half (1/2) tablet on [facility].     INR today: Therapeutic  PLAN Weekly dose was increased by 18% to 30 mg per week  Patient Instructions  Patient instructed to take medications as defined in the Anti-coagulation Track section of this encounter.   Patient instructed to take today's dose.  Patient instructed to take one of your 5mg  peach-colored warfarin tablets by mouth, once-daily--EXCEPT on [facility]. Take ONLY one-half (1/2) tablet on [facility]. Patient verbalized understanding of these instructions.  Patient advised to contact clinic or seek medical attention if signs/symptoms of bleeding or thromboembolism occur.  Patient verbalized understanding by repeating back information and was advised to contact me if further medication-related questions arise. Patient was also provided an information handout.  Follow-up Return in 4 weeks (on 05/01/2023) for Follow up INR.  Elicia Lamp, PharmD, CPP  15 minutes spent face-to-face with the patient during the encounter. 50% of time spent on education, including signs/sx bleeding and clotting, as well as food and drug interactions with warfarin. 50% of time was spent on fingerprick POC INR sample collection,processing, results determination, and documentation in [email or web address].

## 2023-04-25 DIAGNOSIS — H40023 Open angle with borderline findings, high risk, bilateral: Secondary | ICD-10-CM | POA: Diagnosis not present

## 2023-05-01 ENCOUNTER — Ambulatory Visit: Payer: Medicare HMO | Admitting: Pharmacist

## 2023-05-01 DIAGNOSIS — Z7901 Long term (current) use of anticoagulants: Secondary | ICD-10-CM | POA: Diagnosis not present

## 2023-05-01 DIAGNOSIS — Z86711 Personal history of pulmonary embolism: Secondary | ICD-10-CM

## 2023-05-01 DIAGNOSIS — Z86718 Personal history of other venous thrombosis and embolism: Secondary | ICD-10-CM

## 2023-05-01 DIAGNOSIS — I639 Cerebral infarction, unspecified: Secondary | ICD-10-CM

## 2023-05-01 LAB — POCT INR: INR: 2.1 (ref 2.0–3.0)

## 2023-05-01 MED ORDER — WARFARIN SODIUM 5 MG PO TABS
5.0000 mg | ORAL_TABLET | Freq: Every day | ORAL | 3 refills | Status: DC
Start: 2023-05-01 — End: 2023-06-15

## 2023-05-01 NOTE — Progress Notes (Signed)
Anticoagulation Management Alexander English is a 67 y.o. male who reports to the clinic for monitoring of warfarin treatment.    Indication:  History of VTE (DVT and PE); long term current use of oral anticoagulation with warfarin for target INR 2.0 - 3.0.   Duration: indefinite Supervising physician: Debe Coder  Anticoagulation Clinic Visit History: Patient does not report signs/symptoms of bleeding or thromboembolism  Other recent changes: No diet, medications, lifestyle changes.  Anticoagulation Episode Summary     Current INR goal:  2.0-3.0  TTR:  82.8% (11.1 y)  Next INR check:  05/29/2023  INR from last check:  --  Weekly max warfarin dose:  --  Target end date:  Indefinite  INR check location:  Anticoagulation Clinic  Preferred lab:  --  Send INR reminders to:  [facility]   Indications   History of venous thromboembolism [Z86.718]        Comments:  --         No Known Allergies  Current Outpatient Medications:    aspirin EC 81 MG tablet, Take 81 mg by mouth daily., Disp: , Rfl:    atorvastatin (LIPITOR) 10 MG tablet, Take 1 tablet (10 mg total) by mouth daily., Disp: 90 tablet, Rfl: 3   Baclofen 5 MG TABS, Take 1 tablet (5 mg total) by mouth 3 (three) times daily as needed (spasticity)., Disp: 90 tablet, Rfl: 2   ciclopirox (PENLAC) 8 % solution, Apply topically at bedtime. Apply over nail and surrounding skin. Apply daily over previous coat. After seven (7) days, may remove with alcohol and continue cycle., Disp: 6.6 mL, Rfl: 0   diclofenac Sodium (VOLTAREN ARTHRITIS PAIN) 1 % GEL, Apply 2 g topically 4 (four) times daily., Disp: 50 g, Rfl: 0   fesoterodine (TOVIAZ) 8 MG TB24 tablet, Take 1 tablet (8 mg total) by mouth daily., Disp: 90 tablet, Rfl: 3   mirabegron ER (MYRBETRIQ) 50 MG TB24 tablet, Take 1 tablet (50 mg total) by mouth daily., Disp: 90 tablet, Rfl: 2   Multiple Vitamin (MULTIVITAMIN) tablet, Take 1 tablet by mouth daily., Disp: , Rfl:     propranolol ER (INDERAL LA) 60 MG 24 hr capsule, Take 1 capsule (60 mg total) by mouth daily., Disp: 90 capsule, Rfl: 3   tamsulosin (FLOMAX) 0.4 MG CAPS capsule, Take 0.4 mg by mouth daily., Disp: , Rfl:    traMADol (ULTRAM) 50 MG tablet, Take 50 mg by mouth every 6 (six) hours as needed., Disp: , Rfl:    zolpidem (AMBIEN) 5 MG tablet, Take 1 tablet (5 mg total) by mouth at bedtime as needed for sleep., Disp: 30 tablet, Rfl: 2   warfarin (COUMADIN) 5 MG tablet, Take 1 tablet (5 mg total) by mouth daily at 4 PM., Disp: 30 tablet, Rfl: 3 Past Medical History:  Diagnosis Date   Aortic atherosclerosis (HCC) 10/23/2017   Asymptomatic, seen on CT scan   Bilateral cataracts 05/18/2016   Not visually significant   Cancer (HCC)    maxillary   Cerebrovascular accident (CVA) (HCC) 03/27/2020   Chronic constipation 06/12/2013   Chronic low back pain 06/12/2013   L4-5 facet arthropathy    Chronic pain of both shoulders 12/07/2013   A/C joint osteoarthritis    Cluster headaches    Resolved in 2006   COPD (chronic obstructive pulmonary disease) (HCC)    Dry eye syndrome, bilateral 05/18/2016   Embolic stroke involving right middle cerebral artery (HCC) 08/08/2004   Occured after traveling from [place]  to [place] where he was hospitalized for 1 month with no further work-up or therapy.  Returned to [place] unable to walk and was admitted to [facility] immediately after landing. Presumed embolic per neuro but TEE, bubble study, and hypercoag W/U negative. On indefinite coumadin per patient's informed preference.  Residual effects : Left spastic hemiplegia. Tx with phenol tibi   Generalized anxiety disorder 08/03/2018   History of kidney stones    Hyperplastic colon polyp 07/27/2012   Excised endoscopically 07/27/2012   Hypertensive retinopathy of both eyes 05/18/2016   Internal and external hemorrhoids without complication 12/19/2017   Seen on colonoscopy 04/03/2007.   Left nephrolithiasis 08/31/2015   With mild left  hydronephrosis.  Scheduled to undergo shockwave lithotripsy.   Obesity (BMI 30.0-34.9) 12/07/2013   Osteoarthritis of both knees 01/26/2012   Positive PPD 12/07/2013   Pulmonary embolism (HCC) 09/30/2004   Diagnosed in April 2006 after returning from [place].  Likely provoked as it occurred after a 12 hour plane flight within 2 months of R MCA CVA with left hemiparesis. Patient has made an informed decision to remain on lifelong warfarin.    Tubular adenoma 12/19/2017   Endoscopically excised 04/03/2007.   Urge incontinence of urine 08/31/2015   Possibly secondary to prior CVA.  Treated with Myrbetriq.   Social History   Socioeconomic History   Marital status: Single    Spouse name: Not on file   Number of children: 3   Years of education: 16   Highest education level: Not on file  Occupational History   Occupation: Retired  Tobacco Use   Smoking status: Former    Current packs/day: 0.00    Types: Cigarettes    Quit date: 06/28/2007    Years since quitting: 15.8   Smokeless tobacco: Never  Vaping Use   Vaping status: Never Used  Substance and Sexual Activity   Alcohol use: Yes    Comment: Rarely.   Drug use: No   Sexual activity: Not on file  Other Topics Concern   Not on file  Social History Narrative   Born in [place] but has been in the [place] for > 30 years.  Divorced, 1 daughter and 2 sons.  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   Right handed   Coffee/tea sometimes, soda rarely   Social Determinants of Health   Financial Resource Strain: Low Risk  (08/15/2022)   Overall Financial Resource Strain (CARDIA)    Difficulty of Paying Living Expenses: Not hard at all  Food Insecurity: Low Risk  (10/07/2022)   [facility]   Hunger Vital Sign    Worried About Running Out of Food in the Last Year: Never true    Ran Out of Food in the Last Year: Never true  Transportation Needs: Not on file  (10/07/2022)  Physical Activity: Inactive (01/17/2019)   Exercise Vital Sign    Days of Exercise per Week: 0 days    Minutes of Exercise per Session: 0 min  Stress: No Stress Concern Present (01/17/2019)   [employer] of Occupational Health - Occupational Stress Questionnaire    Feeling of Stress : Only a little  Social Connections: Moderately Isolated (08/15/2022)   Social Connection and Isolation Panel [NHANES]    Frequency of Communication with Friends and Family: More than three times a week    Frequency of Social Gatherings with Friends and Family: More than three times a week  Attends Religious Services: More than 4 times per year    Active Member of Clubs or Organizations: No    Attends Banker Meetings: Never    Marital Status: Divorced   Family History  Problem Relation Age of Onset   Stroke Maternal Grandmother    Unexplained death Mother    Depression Mother        After husband's death   Unexplained death Father    Osteoarthritis Father        Knees   Early death Sister    Seizures Sister    Early death Brother 47       Unknown cause   Healthy Daughter    Healthy Son    Stroke Maternal Aunt    Healthy Sister    Healthy Sister    Unexplained death Brother    Healthy Brother    Healthy Son     ASSESSMENT Recent Results: The most recent result is correlated with 30 mg per week: Lab Results  Component Value Date   INR 2.1 05/01/2023   INR 2.0 04/03/2023   INR 2.5 03/06/2023    Anticoagulation Dosing: Description   Take one of your 5mg  peach-colored warfarin tablets by mouth, once-daily.     INR today: Therapeutic  PLAN Weekly dose was increased by 14% to 35 mg per week  Patient Instructions  Patient instructed to take medications as defined in the Anti-coagulation Track section of this encounter.  Patient instructed to take today's dose.  Patient instructed to take one (1) of your 5 mg peach-colored warfarin tablets, by mouth,  once-daily.  Patient verbalized understanding of these instructions.  Patient advised to contact clinic or seek medical attention if signs/symptoms of bleeding or thromboembolism occur.  Patient verbalized understanding by repeating back information and was advised to contact me if further medication-related questions arise. Patient was also provided an information handout.  Follow-up Return in 4 weeks (on 05/29/2023) for Follow up INR.  Elicia Lamp, PharmD,CPP  15 minutes spent face-to-face with the patient during the encounter. 50% of time spent on education, including signs/sx bleeding and clotting, as well as food and drug interactions with warfarin. 50% of time was spent on fingerprick POC INR sample collection,processing, results determination, and documentation in [email or web address].

## 2023-05-01 NOTE — Patient Instructions (Signed)
Patient instructed to take medications as defined in the Anti-coagulation Track section of this encounter.  Patient instructed to take today's dose.  Patient instructed to take one (1) of your 5 mg peach-colored warfarin tablets, by mouth, once-daily. Patient verbalized understanding of these instructions.

## 2023-05-09 DIAGNOSIS — C310 Malignant neoplasm of maxillary sinus: Secondary | ICD-10-CM | POA: Diagnosis not present

## 2023-05-29 ENCOUNTER — Telehealth: Payer: Self-pay | Admitting: *Deleted

## 2023-05-29 ENCOUNTER — Ambulatory Visit: Payer: Medicare HMO | Admitting: Pharmacist

## 2023-05-29 ENCOUNTER — Ambulatory Visit (INDEPENDENT_AMBULATORY_CARE_PROVIDER_SITE_OTHER): Payer: Medicare HMO | Admitting: Internal Medicine

## 2023-05-29 ENCOUNTER — Other Ambulatory Visit (HOSPITAL_COMMUNITY): Payer: Self-pay | Admitting: Internal Medicine

## 2023-05-29 ENCOUNTER — Ambulatory Visit: Payer: Medicare HMO

## 2023-05-29 ENCOUNTER — Other Ambulatory Visit: Payer: Self-pay | Admitting: Internal Medicine

## 2023-05-29 ENCOUNTER — Ambulatory Visit (HOSPITAL_COMMUNITY)
Admission: RE | Admit: 2023-05-29 | Discharge: 2023-05-29 | Disposition: A | Discharge status: Home / Self Care | Payer: Medicare HMO | Source: Ambulatory Visit | Attending: Internal Medicine | Admitting: Internal Medicine

## 2023-05-29 VITALS — BP 134/75 | HR 64 | Temp 97.5°F | Wt 240.5 lb

## 2023-05-29 DIAGNOSIS — J439 Emphysema, unspecified: Secondary | ICD-10-CM | POA: Diagnosis not present

## 2023-05-29 DIAGNOSIS — Z86718 Personal history of other venous thrombosis and embolism: Secondary | ICD-10-CM

## 2023-05-29 DIAGNOSIS — Z7901 Long term (current) use of anticoagulants: Secondary | ICD-10-CM

## 2023-05-29 DIAGNOSIS — M25552 Pain in left hip: Secondary | ICD-10-CM | POA: Insufficient documentation

## 2023-05-29 DIAGNOSIS — R52 Pain, unspecified: Secondary | ICD-10-CM

## 2023-05-29 DIAGNOSIS — M160 Bilateral primary osteoarthritis of hip: Secondary | ICD-10-CM | POA: Diagnosis not present

## 2023-05-29 DIAGNOSIS — I639 Cerebral infarction, unspecified: Secondary | ICD-10-CM | POA: Diagnosis not present

## 2023-05-29 DIAGNOSIS — M25551 Pain in right hip: Secondary | ICD-10-CM | POA: Diagnosis not present

## 2023-05-29 DIAGNOSIS — G8929 Other chronic pain: Secondary | ICD-10-CM | POA: Diagnosis not present

## 2023-05-29 DIAGNOSIS — J449 Chronic obstructive pulmonary disease, unspecified: Secondary | ICD-10-CM | POA: Diagnosis not present

## 2023-05-29 LAB — POCT INR: INR: 4.6 — AB (ref 2.0–3.0)

## 2023-05-29 NOTE — Patient Instructions (Signed)
Thank you, Alexander English for allowing [place] to provide your care today. Today we discussed:  Left hip pain Call Dr Wadie Lessen office about a follow up for your continued left hip pain [phone] (this is your orthopedic surgeon) Call your physical therapist to get more visits in so they can help you with your hip pain and offloading  We are getting a hip xray You can take tylenol 1000 mg 3 times a day for your pain  I have ordered the following labs for you:  Lab Orders  No laboratory test(s) ordered today     Tests ordered today:  DG HIPS BILAT WITH PELVIS MIN 5 VIEWS   Referrals ordered today:   Referral Orders  No referral(s) requested today     I have ordered the following medication/changed the following medications:   Stop the following medications: There are no discontinued medications.   Start the following medications: No orders of the defined types were placed in this encounter.    Follow up: 6 months    Should you have any questions or concerns please call the internal medicine clinic at [phone].     Elza Rafter, D.O. [facility]

## 2023-05-29 NOTE — Progress Notes (Signed)
Anticoagulation Management Alexander English is a 67 y.o. male who reports to the clinic for monitoring of warfarin treatment.    Indication: DVT, history of: History of CVA; Long term current use of oral anticoagulation with warfarin for target INR range 2.0 - 3.0.  Duration: indefinite Supervising physician:  Dickie La, MD  Anticoagulation Clinic Visit History: Patient does not report signs/symptoms of bleeding or thromboembolism  Other recent changes: No diet, medications, lifestyle changes.  Anticoagulation Episode Summary     Current INR goal:  2.0-3.0  TTR:  82.5% (11.2 y)  Next INR check:  06/12/2023  INR from last check:  4.6 (05/29/2023)  Weekly max warfarin dose:  --  Target end date:  Indefinite  INR check location:  Anticoagulation Clinic  Preferred lab:  --  Send INR reminders to:  [facility]   Indications   History of venous thromboembolism [Z86.718]        Comments:  --         No Known Allergies  Current Outpatient Medications:    aspirin EC 81 MG tablet, Take 81 mg by mouth daily., Disp: , Rfl:    atorvastatin (LIPITOR) 10 MG tablet, Take 1 tablet (10 mg total) by mouth daily., Disp: 90 tablet, Rfl: 3   Baclofen 5 MG TABS, Take 1 tablet (5 mg total) by mouth 3 (three) times daily as needed (spasticity)., Disp: 90 tablet, Rfl: 2   ciclopirox (PENLAC) 8 % solution, Apply topically at bedtime. Apply over nail and surrounding skin. Apply daily over previous coat. After seven (7) days, may remove with alcohol and continue cycle., Disp: 6.6 mL, Rfl: 0   diclofenac Sodium (VOLTAREN ARTHRITIS PAIN) 1 % GEL, Apply 2 g topically 4 (four) times daily., Disp: 50 g, Rfl: 0   fesoterodine (TOVIAZ) 8 MG TB24 tablet, Take 1 tablet (8 mg total) by mouth daily., Disp: 90 tablet, Rfl: 3   mirabegron ER (MYRBETRIQ) 50 MG TB24 tablet, Take 1 tablet (50 mg total) by mouth daily., Disp: 90 tablet, Rfl: 2   Multiple Vitamin (MULTIVITAMIN) tablet, Take 1 tablet by mouth daily.,  Disp: , Rfl:    propranolol ER (INDERAL LA) 60 MG 24 hr capsule, Take 1 capsule (60 mg total) by mouth daily., Disp: 90 capsule, Rfl: 3   tamsulosin (FLOMAX) 0.4 MG CAPS capsule, Take 0.4 mg by mouth daily., Disp: , Rfl:    traMADol (ULTRAM) 50 MG tablet, Take 50 mg by mouth every 6 (six) hours as needed., Disp: , Rfl:    warfarin (COUMADIN) 5 MG tablet, Take 1 tablet (5 mg total) by mouth daily at 4 PM., Disp: 30 tablet, Rfl: 3   zolpidem (AMBIEN) 5 MG tablet, Take 1 tablet (5 mg total) by mouth at bedtime as needed for sleep., Disp: 30 tablet, Rfl: 2 Past Medical History:  Diagnosis Date   Aortic atherosclerosis (HCC) 10/23/2017   Asymptomatic, seen on CT scan   Bilateral cataracts 05/18/2016   Not visually significant   Cancer (HCC)    maxillary   Cerebrovascular accident (CVA) (HCC) 03/27/2020   Chronic constipation 06/12/2013   Chronic low back pain 06/12/2013   L4-5 facet arthropathy    Chronic pain of both shoulders 12/07/2013   A/C joint osteoarthritis    Cluster headaches    Resolved in 2006   COPD (chronic obstructive pulmonary disease) (HCC)    Dry eye syndrome, bilateral 05/18/2016   Embolic stroke involving right middle cerebral artery (HCC) 08/08/2004   Occured after traveling  from [place] to [place] where he was hospitalized for 1 month with no further work-up or therapy.  Returned to [place] unable to walk and was admitted to [facility] immediately after landing. Presumed embolic per neuro but TEE, bubble study, and hypercoag W/U negative. On indefinite coumadin per patient's informed preference.  Residual effects : Left spastic hemiplegia. Tx with phenol tibi   Generalized anxiety disorder 08/03/2018   History of kidney stones    Hyperplastic colon polyp 07/27/2012   Excised endoscopically 07/27/2012   Hypertensive retinopathy of both eyes 05/18/2016   Internal and external hemorrhoids without complication 12/19/2017   Seen on colonoscopy 04/03/2007.   Left nephrolithiasis 08/31/2015    With mild left hydronephrosis.  Scheduled to undergo shockwave lithotripsy.   Obesity (BMI 30.0-34.9) 12/07/2013   Osteoarthritis of both knees 01/26/2012   Positive PPD 12/07/2013   Pulmonary embolism (HCC) 09/30/2004   Diagnosed in April 2006 after returning from [place].  Likely provoked as it occurred after a 12 hour plane flight within 2 months of R MCA CVA with left hemiparesis. Patient has made an informed decision to remain on lifelong warfarin.    Tubular adenoma 12/19/2017   Endoscopically excised 04/03/2007.   Urge incontinence of urine 08/31/2015   Possibly secondary to prior CVA.  Treated with Myrbetriq.   Social History   Socioeconomic History   Marital status: Single    Spouse name: Not on file   Number of children: 3   Years of education: 16   Highest education level: Not on file  Occupational History   Occupation: Retired  Tobacco Use   Smoking status: Former    Current packs/day: 0.00    Types: Cigarettes    Quit date: 06/28/2007    Years since quitting: 15.9   Smokeless tobacco: Never  Vaping Use   Vaping status: Never Used  Substance and Sexual Activity   Alcohol use: Yes    Comment: Rarely.   Drug use: No   Sexual activity: Not on file  Other Topics Concern   Not on file  Social History Narrative   Born in [place] but has been in the [place] for > 30 years.  Divorced, 1 daughter and 2 sons.  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   Right handed   Coffee/tea sometimes, soda rarely   Social Determinants of Health   Financial Resource Strain: Low Risk  (08/15/2022)   Overall Financial Resource Strain (CARDIA)    Difficulty of Paying Living Expenses: Not hard at all  Food Insecurity: Low Risk  (10/07/2022)   [facility]   Hunger Vital Sign    Worried About Running Out of Food in the Last Year: Never true    Ran Out of Food in the Last Year: Never true  Transportation Needs: Not  on file (10/07/2022)  Physical Activity: Inactive (01/17/2019)   Exercise Vital Sign    Days of Exercise per Week: 0 days    Minutes of Exercise per Session: 0 min  Stress: No Stress Concern Present (01/17/2019)   [employer] of Occupational Health - Occupational Stress Questionnaire    Feeling of Stress : Only a little  Social Connections: Moderately Isolated (08/15/2022)   Social Connection and Isolation Panel [NHANES]    Frequency of Communication with Friends and Family: More than three times a week    Frequency of Social Gatherings with Friends and Family: More than three times a week  Attends Religious Services: More than 4 times per year    Active Member of Clubs or Organizations: No    Attends Banker Meetings: Never    Marital Status: Divorced   Family History  Problem Relation Age of Onset   Stroke Maternal Grandmother    Unexplained death Mother    Depression Mother        After husband's death   Unexplained death Father    Osteoarthritis Father        Knees   Early death Sister    Seizures Sister    Early death Brother 73       Unknown cause   Healthy Daughter    Healthy Son    Stroke Maternal Aunt    Healthy Sister    Healthy Sister    Unexplained death Brother    Healthy Brother    Healthy Son     ASSESSMENT Recent Results: The most recent result is correlated with 35 mg per week: Lab Results  Component Value Date   INR 4.6 (A) 05/29/2023   INR 2.1 05/01/2023   INR 2.0 04/03/2023    Anticoagulation Dosing: Description   OMIT DOSE for Monday, May 29, 2023. Recommence taking warfarin on TUESDAY, December 3rd by taking ONLY 1/2 tablet; then all subsequent days but Tuesdays, take one (1) tablet.      INR today: Supratherapeutic  PLAN Weekly dose was decreased by 7% to 32.5 mg per week with a ONE TIME omitted dose today, Monday 2-DEC-24.  Patient Instructions  Patient instructed to take medications as defined in the  Anti-coagulation Track section of this encounter.  Patient instructed to OMIT today's dose.  Patient instructed to take 1/2 tablet every TUESDAY; One (1) tablet on all other days (Sundays, Mondays, Wednesdays, Thursdays, Fridays and Saturdays.) Patient verbalized understanding of these instructions.  Patient advised to contact clinic or seek medical attention if signs/symptoms of bleeding or thromboembolism occur.  Patient verbalized understanding by repeating back information and was advised to contact me if further medication-related questions arise. Patient was also provided an information handout.  Follow-up Return in 2 weeks (on 06/12/2023) for Follow up INR.  Elicia Lamp, PharmD, CPP  15 minutes spent face-to-face with the patient during the encounter. 50% of time spent on education, including signs/sx bleeding and clotting, as well as food and drug interactions with warfarin. 50% of time was spent on fingerprick POC INR sample collection,processing, results determination, and documentation in [email or web address].

## 2023-05-29 NOTE — Patient Instructions (Signed)
Patient instructed to take medications as defined in the Anti-coagulation Track section of this encounter.  Patient instructed to OMIT today's dose.  Patient instructed to take 1/2 tablet every TUESDAY; One (1) tablet on all other days (Sundays, Mondays, Wednesdays, Thursdays, Fridays and Saturdays.) Patient verbalized understanding of these instructions.

## 2023-05-29 NOTE — Telephone Encounter (Signed)
Patient here for an appointment with Dr. Alexandria Lodge.  C/O of severe hip pain.  Requesting an Xray or something for the pain.  Given an appointment for 2:45 PM.  Patient said pain has been going on for 3 months.

## 2023-05-29 NOTE — Progress Notes (Signed)
CC: left hip pain  HPI:  [name] is a 67 y.o. male living with a history stated below and presents today for a follow up of his left hip pain. Please see problem based assessment and plan for additional details.  Past Medical History:  Diagnosis Date   Aortic atherosclerosis (HCC) 10/23/2017   Asymptomatic, seen on CT scan   Bilateral cataracts 05/18/2016   Not visually significant   Cancer (HCC)    maxillary   Cerebrovascular accident (CVA) (HCC) 03/27/2020   Chronic constipation 06/12/2013   Chronic low back pain 06/12/2013   L4-5 facet arthropathy    Chronic pain of both shoulders 12/07/2013   A/C joint osteoarthritis    Cluster headaches    Resolved in 2006   COPD (chronic obstructive pulmonary disease) (HCC)    Dry eye syndrome, bilateral 05/18/2016   Embolic stroke involving right middle cerebral artery (HCC) 08/08/2004   Occured after traveling from [place] to [place] where he was hospitalized for 1 month with no further work-up or therapy.  Returned to [place] unable to walk and was admitted to [facility] immediately after landing. Presumed embolic per neuro but TEE, bubble study, and hypercoag W/U negative. On indefinite coumadin per patient's informed preference.  Residual effects : Left spastic hemiplegia. Tx with phenol tibi   Generalized anxiety disorder 08/03/2018   History of kidney stones    Hyperplastic colon polyp 07/27/2012   Excised endoscopically 07/27/2012   Hypertensive retinopathy of both eyes 05/18/2016   Internal and external hemorrhoids without complication 12/19/2017   Seen on colonoscopy 04/03/2007.   Left nephrolithiasis 08/31/2015   With mild left hydronephrosis.  Scheduled to undergo shockwave lithotripsy.   Obesity (BMI 30.0-34.9) 12/07/2013   Osteoarthritis of both knees 01/26/2012   Positive PPD 12/07/2013   Pulmonary embolism (HCC) 09/30/2004   Diagnosed in April 2006 after returning from [place].  Likely provoked as it occurred after a 12  hour plane flight within 2 months of R MCA CVA with left hemiparesis. Patient has made an informed decision to remain on lifelong warfarin.    Tubular adenoma 12/19/2017   Endoscopically excised 04/03/2007.   Urge incontinence of urine 08/31/2015   Possibly secondary to prior CVA.  Treated with Myrbetriq.    Current Outpatient Medications on File Prior to Visit  Medication Sig Dispense Refill   aspirin EC 81 MG tablet Take 81 mg by mouth daily.     atorvastatin (LIPITOR) 10 MG tablet Take 1 tablet (10 mg total) by mouth daily. 90 tablet 3   Baclofen 5 MG TABS Take 1 tablet (5 mg total) by mouth 3 (three) times daily as needed (spasticity). 90 tablet 2   ciclopirox (PENLAC) 8 % solution Apply topically at bedtime. Apply over nail and surrounding skin. Apply daily over previous coat. After seven (7) days, may remove with alcohol and continue cycle. 6.6 mL 0   diclofenac Sodium (VOLTAREN ARTHRITIS PAIN) 1 % GEL Apply 2 g topically 4 (four) times daily. 50 g 0   fesoterodine (TOVIAZ) 8 MG TB24 tablet Take 1 tablet (8 mg total) by mouth daily. 90 tablet 3   mirabegron ER (MYRBETRIQ) 50 MG TB24 tablet Take 1 tablet (50 mg total) by mouth daily. 90 tablet 2   Multiple Vitamin (MULTIVITAMIN) tablet Take 1 tablet by mouth daily.     propranolol ER (INDERAL LA) 60 MG 24 hr capsule Take 1 capsule (60 mg total) by mouth daily. 90 capsule 3   tamsulosin (FLOMAX) 0.4  MG CAPS capsule Take 0.4 mg by mouth daily.     traMADol (ULTRAM) 50 MG tablet Take 50 mg by mouth every 6 (six) hours as needed.     warfarin (COUMADIN) 5 MG tablet Take 1 tablet (5 mg total) by mouth daily at 4 PM. 30 tablet 3   zolpidem (AMBIEN) 5 MG tablet Take 1 tablet (5 mg total) by mouth at bedtime as needed for sleep. 30 tablet 2   No current facility-administered medications on file prior to visit.    Family History  Problem Relation Age of Onset   Stroke Maternal Grandmother    Unexplained death Mother    Depression Mother         After husband's death   Unexplained death Father    Osteoarthritis Father        Knees   Early death Sister    Seizures Sister    Early death Brother 67       Unknown cause   Healthy Daughter    Healthy Son    Stroke Maternal Aunt    Healthy Sister    Healthy Sister    Unexplained death Brother    Healthy Brother    Healthy Son     Social History   Socioeconomic History   Marital status: Single    Spouse name: Not on file   Number of children: 3   Years of education: 16   Highest education level: Not on file  Occupational History   Occupation: Retired  Tobacco Use   Smoking status: Former    Current packs/day: 0.00    Types: Cigarettes    Quit date: 06/28/2007    Years since quitting: 15.9   Smokeless tobacco: Never  Vaping Use   Vaping status: Never Used  Substance and Sexual Activity   Alcohol use: Yes    Comment: Rarely.   Drug use: No   Sexual activity: Not on file  Other Topics Concern   Not on file  Social History Narrative   Born in [place] but has been in the [place] for > 30 years.  Divorced, 1 daughter and 2 sons.  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   Right handed   Coffee/tea sometimes, soda rarely   Social Determinants of Health   Financial Resource Strain: Low Risk  (08/15/2022)   Overall Financial Resource Strain (CARDIA)    Difficulty of Paying Living Expenses: Not hard at all  Food Insecurity: Low Risk  (10/07/2022)   [facility]   Hunger Vital Sign    Worried About Running Out of Food in the Last Year: Never true    Ran Out of Food in the Last Year: Never true  Transportation Needs: Not on file (10/07/2022)  Physical Activity: Inactive (01/17/2019)   Exercise Vital Sign    Days of Exercise per Week: 0 days    Minutes of Exercise per Session: 0 min  Stress: No Stress Concern Present (01/17/2019)   [employer] of Occupational Health - Occupational Stress  Questionnaire    Feeling of Stress : Only a little  Social Connections: Moderately Isolated (08/15/2022)   Social Connection and Isolation Panel [NHANES]    Frequency of Communication with Friends and Family: More than three times a week    Frequency of Social Gatherings with Friends and Family: More than three times a week    Attends Religious Services: More than 4 times per  year    Active Member of Clubs or Organizations: No    Attends Banker Meetings: Never    Marital Status: Divorced  Catering manager Violence: Not At Risk (08/15/2022)   Humiliation, Afraid, Rape, and Kick questionnaire    Fear of Current or Ex-Partner: No    Emotionally Abused: No    Physically Abused: No    Sexually Abused: No    Review of Systems: ROS negative except for what is noted on the assessment and plan.  Vitals:   05/29/23 1307  BP: 134/75  Pulse: 64  Temp: (!) 97.5 F (36.4 C)  TempSrc: Oral  SpO2: 98%  Weight: 240 lb 8 oz (109.1 kg)    Physical Exam: Constitutional: appears well, no acute distress Pulmonary/Chest: normal work of breathing on room air MSK: left leg in external brace, tenderness to palpation over left iliac crest and greater trochanter Neurological: alert & oriented x 3, residual left sided lower extremity weakness Skin: warm and dry Psych: normal mood and behavior  Assessment & Plan:    Patient discussed with Dr. Sol Blazing  Left hip pain The patient has had chronic left hip pain for years that he states has worsened over the last couple of months. The patient has chronic left sided lower extremity weakness from a previous stroke and also wears an external brace because of this and his knee hyperextension. His pain is located at the hip and radiates around to his groin and low back, but does not radiate down his legs. He describes the pain as shooting and states that tylenol 650 mg does occasionally help to take the edge off. Currently, his pain is a 2/10 but at its  worst gets to a 5/10. He denies any fevers, chills, recent injuries, weight loss, or other sxs.   The patient was referred to ortho years ago for hip injections vs hip replacement but he does not recall the outcome of these visits. Additionally, last xrays were from 2021 which were unrevealing of the cause of his pain.  Plan:  - F/u with ortho (provided pt with phone number to Dr. Wadie Lessen office) - F/u with physical therapy (had referral to PT for his brace) - Increase tylenol to 1000 mg PRN - Hip xrays today   Rickelle Sylvestre, D.O. [facility] Internal Medicine, PGY-3 Phone: [phone] Date 05/29/2023 Time 1:41 PM

## 2023-05-29 NOTE — Assessment & Plan Note (Addendum)
The patient has had chronic left hip pain for years that he states has worsened over the last couple of months. The patient also has chronic left sided lower extremity weakness from a previous stroke and also wears an external brace because of this and his chronic knee hyperextension. His pain is located at the hip and radiates around to his groin and low back, but does not radiate down his legs. He describes the pain as shooting and states that tylenol 650 mg does occasionally help to take the edge off. Currently, his pain is a 2/10 but at its worst gets to a 5/10. He denies any fevers, chills, recent injuries, weight loss, or other sxs.   The patient was referred to ortho years ago for hip injections vs hip replacement but he does not recall the outcome of these visits. Additionally, last xrays were from 2021 which were unrevealing of the cause of his pain.  Plan:  - F/u with ortho (provided pt with phone number to Dr. Wadie Lessen office) - F/u with physical therapy (had referral to PT for his brace) - Increase tylenol to 1000 mg PRN - Hip xrays today

## 2023-05-30 NOTE — Progress Notes (Signed)
Internal Medicine Clinic Attending ° °Case discussed with Dr. Atway  At the time of the visit.  We reviewed the resident’s history and exam and pertinent patient test results.  I agree with the assessment, diagnosis, and plan of care documented in the resident’s note.  °

## 2023-06-07 NOTE — Progress Notes (Signed)
This encounter was created in error - please disregard.  I called pt X 3 with no answer.  I left a detailed VM on both numbers for pt to call office and reschedule AWV.

## 2023-06-09 NOTE — Progress Notes (Signed)
Called patient about xray results. He is going to make an appt to see his orthopedic surgeon, Dr. Turner Daniels, to discuss treatment options (he has gotten hip injections previously).

## 2023-06-12 ENCOUNTER — Ambulatory Visit (INDEPENDENT_AMBULATORY_CARE_PROVIDER_SITE_OTHER): Payer: Medicare HMO | Admitting: Pharmacist

## 2023-06-12 DIAGNOSIS — Z86711 Personal history of pulmonary embolism: Secondary | ICD-10-CM | POA: Diagnosis not present

## 2023-06-12 DIAGNOSIS — I639 Cerebral infarction, unspecified: Secondary | ICD-10-CM

## 2023-06-12 DIAGNOSIS — Z86718 Personal history of other venous thrombosis and embolism: Secondary | ICD-10-CM

## 2023-06-12 DIAGNOSIS — Z7901 Long term (current) use of anticoagulants: Secondary | ICD-10-CM

## 2023-06-12 LAB — POCT INR: INR: 3.1 — AB (ref 2.0–3.0)

## 2023-06-12 NOTE — Progress Notes (Signed)
Anticoagulation Management Alexander English is a 67 y.o. male who reports to the clinic for monitoring of warfarin treatment.    Indication: History of CVA, DVT, and PE on long term current use of oral anticoagulation with warfarin for target INR 2.0 - 3.0. Duration: indefinite Supervising physician:  Mercie Eon, MD  Anticoagulation Clinic Visit History: Patient does not report signs/symptoms of bleeding or thromboembolism  Other recent changes: No diet, medications, lifestyle changes.  Anticoagulation Episode Summary     Current INR goal:  2.0-3.0  TTR:  82.2% (11.2 y)  Next INR check:  07/03/2023  INR from last check:  3.1 (06/12/2023)  Weekly max warfarin dose:  --  Target end date:  Indefinite  INR check location:  Anticoagulation Clinic  Preferred lab:  --  Send INR reminders to:  [facility]   Indications   History of venous thromboembolism [Z86.718]        Comments:  --         No Known Allergies  Current Outpatient Medications:    aspirin EC 81 MG tablet, Take 81 mg by mouth daily., Disp: , Rfl:    atorvastatin (LIPITOR) 10 MG tablet, Take 1 tablet (10 mg total) by mouth daily., Disp: 90 tablet, Rfl: 3   Baclofen 5 MG TABS, Take 1 tablet (5 mg total) by mouth 3 (three) times daily as needed (spasticity)., Disp: 90 tablet, Rfl: 2   ciclopirox (PENLAC) 8 % solution, Apply topically at bedtime. Apply over nail and surrounding skin. Apply daily over previous coat. After seven (7) days, may remove with alcohol and continue cycle. (Patient not taking: Reported on 06/12/2023), Disp: 6.6 mL, Rfl: 0   diclofenac Sodium (VOLTAREN ARTHRITIS PAIN) 1 % GEL, Apply 2 g topically 4 (four) times daily., Disp: 50 g, Rfl: 0   fesoterodine (TOVIAZ) 8 MG TB24 tablet, Take 1 tablet (8 mg total) by mouth daily., Disp: 90 tablet, Rfl: 3   mirabegron ER (MYRBETRIQ) 50 MG TB24 tablet, Take 1 tablet (50 mg total) by mouth daily., Disp: 90 tablet, Rfl: 2   Multiple Vitamin (MULTIVITAMIN)  tablet, Take 1 tablet by mouth daily., Disp: , Rfl:    propranolol ER (INDERAL LA) 60 MG 24 hr capsule, Take 1 capsule (60 mg total) by mouth daily., Disp: 90 capsule, Rfl: 3   tamsulosin (FLOMAX) 0.4 MG CAPS capsule, Take 0.4 mg by mouth daily., Disp: , Rfl:    traMADol (ULTRAM) 50 MG tablet, Take 50 mg by mouth every 6 (six) hours as needed., Disp: , Rfl:    warfarin (COUMADIN) 5 MG tablet, Take 1 tablet (5 mg total) by mouth daily at 4 PM., Disp: 30 tablet, Rfl: 3   zolpidem (AMBIEN) 5 MG tablet, Take 1 tablet (5 mg total) by mouth at bedtime as needed for sleep., Disp: 30 tablet, Rfl: 2 Past Medical History:  Diagnosis Date   Aortic atherosclerosis (HCC) 10/23/2017   Asymptomatic, seen on CT scan   Bilateral cataracts 05/18/2016   Not visually significant   Cancer (HCC)    maxillary   Cerebrovascular accident (CVA) (HCC) 03/27/2020   Chronic constipation 06/12/2013   Chronic low back pain 06/12/2013   L4-5 facet arthropathy    Chronic pain of both shoulders 12/07/2013   A/C joint osteoarthritis    Cluster headaches    Resolved in 2006   COPD (chronic obstructive pulmonary disease) (HCC)    Dry eye syndrome, bilateral 05/18/2016   Embolic stroke involving right middle cerebral artery (HCC) 08/08/2004  Occured after traveling from [place] to [place] where he was hospitalized for 1 month with no further work-up or therapy.  Returned to [place] unable to walk and was admitted to [facility] immediately after landing. Presumed embolic per neuro but TEE, bubble study, and hypercoag W/U negative. On indefinite coumadin per patient's informed preference.  Residual effects : Left spastic hemiplegia. Tx with phenol tibi   Generalized anxiety disorder 08/03/2018   History of kidney stones    Hyperplastic colon polyp 07/27/2012   Excised endoscopically 07/27/2012   Hypertensive retinopathy of both eyes 05/18/2016   Internal and external hemorrhoids without complication 12/19/2017   Seen on colonoscopy  04/03/2007.   Left nephrolithiasis 08/31/2015   With mild left hydronephrosis.  Scheduled to undergo shockwave lithotripsy.   Obesity (BMI 30.0-34.9) 12/07/2013   Osteoarthritis of both knees 01/26/2012   Positive PPD 12/07/2013   Pulmonary embolism (HCC) 09/30/2004   Diagnosed in April 2006 after returning from [place].  Likely provoked as it occurred after a 12 hour plane flight within 2 months of R MCA CVA with left hemiparesis. Patient has made an informed decision to remain on lifelong warfarin.    Tubular adenoma 12/19/2017   Endoscopically excised 04/03/2007.   Urge incontinence of urine 08/31/2015   Possibly secondary to prior CVA.  Treated with Myrbetriq.   Social History   Socioeconomic History   Marital status: Single    Spouse name: Not on file   Number of children: 3   Years of education: 16   Highest education level: Not on file  Occupational History   Occupation: Retired  Tobacco Use   Smoking status: Former    Current packs/day: 0.00    Types: Cigarettes    Quit date: 06/28/2007    Years since quitting: 15.9   Smokeless tobacco: Never  Vaping Use   Vaping status: Never Used  Substance and Sexual Activity   Alcohol use: Yes    Comment: Rarely.   Drug use: No   Sexual activity: Not on file  Other Topics Concern   Not on file  Social History Narrative   Born in [place] but has been in the [place] for > 30 years.  Divorced, 1 daughter and 2 sons.  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   Right handed   Coffee/tea sometimes, soda rarely   Social Drivers of Health   Financial Resource Strain: Low Risk  (08/15/2022)   Overall Financial Resource Strain (CARDIA)    Difficulty of Paying Living Expenses: Not hard at all  Food Insecurity: Low Risk  (10/07/2022)   [facility]   Hunger Vital Sign    Worried About Running Out of Food in the Last Year: Never true    Ran Out of Food in the Last  Year: Never true  Transportation Needs: Not on file (10/07/2022)  Physical Activity: Inactive (01/17/2019)   Exercise Vital Sign    Days of Exercise per Week: 0 days    Minutes of Exercise per Session: 0 min  Stress: No Stress Concern Present (01/17/2019)   [employer] of Occupational Health - Occupational Stress Questionnaire    Feeling of Stress : Only a little  Social Connections: Moderately Isolated (08/15/2022)   Social Connection and Isolation Panel [NHANES]    Frequency of Communication with Friends and Family: More than three times a week    Frequency of Social Gatherings with Friends and Family: More than three  times a week    Attends Religious Services: More than 4 times per year    Active Member of Clubs or Organizations: No    Attends Banker Meetings: Never    Marital Status: Divorced   Family History  Problem Relation Age of Onset   Stroke Maternal Grandmother    Unexplained death Mother    Depression Mother        After husband's death   Unexplained death Father    Osteoarthritis Father        Knees   Early death Sister    Seizures Sister    Early death Brother 70       Unknown cause   Healthy Daughter    Healthy Son    Stroke Maternal Aunt    Healthy Sister    Healthy Sister    Unexplained death Brother    Healthy Brother    Healthy Son     ASSESSMENT Recent Results: The most recent result is correlated with 32.5 mg per week: Lab Results  Component Value Date   INR 3.1 (A) 06/12/2023   INR 4.6 (A) 05/29/2023   INR 2.1 05/01/2023    Anticoagulation Dosing: Description   Take only one-half (1/2) tablet on Mondays and Thursdays. All other days, take one (1) tablet.      INR today: Supratherapeutic  PLAN Weekly dose was decreased by 8% to 30 mg per week  Patient Instructions  Patient instructed to take medications as defined in the Anti-coagulation Track section of this encounter.  Patient instructed to take today's dose.   Patient instructed to take only one-half (1/2) tablet on Mondays and Thursdays. All other days, take one (1) tablet.  Patient verbalized understanding of these instructions.  Patient advised to contact clinic or seek medical attention if signs/symptoms of bleeding or thromboembolism occur.  Patient verbalized understanding by repeating back information and was advised to contact me if further medication-related questions arise. Patient was also provided an information handout.  Follow-up Return in 3 weeks (on 07/03/2023) for Follow up INR.  Elicia Lamp, PharmD, CPP  15 minutes spent face-to-face with the patient during the encounter. 50% of time spent on education, including signs/sx bleeding and clotting, as well as food and drug interactions with warfarin. 50% of time was spent on fingerprick POC INR sample collection,processing, results determination, and documentation in [email or web address].

## 2023-06-12 NOTE — Patient Instructions (Signed)
Patient instructed to take medications as defined in the Anti-coagulation Track section of this encounter.  Patient instructed to take today's dose.  Patient instructed to take only one-half (1/2) tablet on Mondays and Thursdays. All other days, take one (1) tablet.  Patient verbalized understanding of these instructions.

## 2023-06-15 ENCOUNTER — Other Ambulatory Visit: Payer: Self-pay

## 2023-06-15 DIAGNOSIS — Z86711 Personal history of pulmonary embolism: Secondary | ICD-10-CM

## 2023-06-15 MED ORDER — WARFARIN SODIUM 5 MG PO TABS: 5.0000 mg | ORAL_TABLET | Freq: Every day | ORAL | 3 refills | Status: DC

## 2023-07-03 ENCOUNTER — Ambulatory Visit: Payer: Medicare HMO

## 2023-07-10 ENCOUNTER — Ambulatory Visit: Payer: Medicare HMO

## 2023-07-19 ENCOUNTER — Encounter: Payer: Medicare HMO | Admitting: Student

## 2023-07-24 ENCOUNTER — Telehealth: Payer: Self-pay | Admitting: *Deleted

## 2023-07-24 ENCOUNTER — Ambulatory Visit: Payer: Medicare HMO

## 2023-07-24 NOTE — Telephone Encounter (Signed)
Call from pt  stating he has noticed some weakness in his right arm/hand; started 2-3 weeks ago. He also has been having right shoulder blade pain. States he's losing strength in his right hand; like, having trouble using a pair of scissors. Pt is very concern since he's has deficit on his left side. Pt denies having changes in his speech, vision. Denies facial drooping. First available appt given Wed @ 1445PM w/Dr Allena Katz. Pt was instructed to call 911 if symptoms worsen or develop any (stroke)symptoms. Sending concerns to his PCP for any addition recommednations.

## 2023-07-24 NOTE — Telephone Encounter (Signed)
Error

## 2023-07-24 NOTE — Telephone Encounter (Signed)
I think this recommendation is appropriate, I dont think I have any thing else to add- needs to be seen and evaluated given duration of symptoms appointment in 2 days is appropriate.

## 2023-07-26 ENCOUNTER — Ambulatory Visit (INDEPENDENT_AMBULATORY_CARE_PROVIDER_SITE_OTHER): Payer: Medicare HMO | Admitting: Student

## 2023-07-26 ENCOUNTER — Encounter: Payer: Self-pay | Admitting: Student

## 2023-07-26 VITALS — BP 111/70 | HR 70 | Temp 97.6°F | Ht 72.0 in | Wt 239.6 lb

## 2023-07-26 DIAGNOSIS — R29898 Other symptoms and signs involving the musculoskeletal system: Secondary | ICD-10-CM | POA: Diagnosis not present

## 2023-07-26 DIAGNOSIS — M170 Bilateral primary osteoarthritis of knee: Secondary | ICD-10-CM

## 2023-07-26 DIAGNOSIS — R7303 Prediabetes: Secondary | ICD-10-CM | POA: Diagnosis not present

## 2023-07-26 LAB — POCT GLYCOSYLATED HEMOGLOBIN (HGB A1C): Hemoglobin A1C: 5.8 % — AB (ref 4.0–5.6)

## 2023-07-26 LAB — GLUCOSE, CAPILLARY: Glucose-Capillary: 77 mg/dL (ref 70–99)

## 2023-07-26 NOTE — Progress Notes (Signed)
CC: Right arm weakness  HPI:  [name] is a 68 y.o. male with past medical history of hemorrhoids, ED, emphysema, aortic atherosclerosis who presents for follow-up appointment.  Please see assessment and plan for full HPI.  Medications: History of CVA/atherosclerosis: Aspirin 81 mg daily, atorvastatin 10 mg daily Osteoarthritis: Baclofen 5 mg 3 times daily as needed, tramadol 50 mg every 6 Hours as needed Urge incontinence: Fesoterodine 8 mg daily, mirabegron 50 mg daily History of CVA, DVT, PE: On warfarin BPH: Tamsulosin 0.4 mg daily Insomnia: Ambien 5 mg nightly as needed Tremor: Propranolol 60 mg daily  Past Medical History:  Diagnosis Date   Aortic atherosclerosis (HCC) 10/23/2017   Asymptomatic, seen on CT scan   Bilateral cataracts 05/18/2016   Not visually significant   Cancer (HCC)    maxillary   Cerebrovascular accident (CVA) (HCC) 03/27/2020   Chronic constipation 06/12/2013   Chronic low back pain 06/12/2013   L4-5 facet arthropathy    Chronic pain of both shoulders 12/07/2013   A/C joint osteoarthritis    Cluster headaches    Resolved in 2006   COPD (chronic obstructive pulmonary disease) (HCC)    Dry eye syndrome, bilateral 05/18/2016   Embolic stroke involving right middle cerebral artery (HCC) 08/08/2004   Occured after traveling from [place] to [place] where he was hospitalized for 1 month with no further work-up or therapy.  Returned to [place] unable to walk and was admitted to [facility] immediately after landing. Presumed embolic per neuro but TEE, bubble study, and hypercoag W/U negative. On indefinite coumadin per patient's informed preference.  Residual effects : Left spastic hemiplegia. Tx with phenol tibi   Generalized anxiety disorder 08/03/2018   History of kidney stones    Hyperplastic colon polyp 07/27/2012   Excised endoscopically 07/27/2012   Hypertensive retinopathy of both eyes 05/18/2016   Internal and external hemorrhoids without  complication 12/19/2017   Seen on colonoscopy 04/03/2007.   Left nephrolithiasis 08/31/2015   With mild left hydronephrosis.  Scheduled to undergo shockwave lithotripsy.   Obesity (BMI 30.0-34.9) 12/07/2013   Osteoarthritis of both knees 01/26/2012   Positive PPD 12/07/2013   Pulmonary embolism (HCC) 09/30/2004   Diagnosed in April 2006 after returning from [place].  Likely provoked as it occurred after a 12 hour plane flight within 2 months of R MCA CVA with left hemiparesis. Patient has made an informed decision to remain on lifelong warfarin.    Tubular adenoma 12/19/2017   Endoscopically excised 04/03/2007.   Urge incontinence of urine 08/31/2015   Possibly secondary to prior CVA.  Treated with Myrbetriq.     Current Outpatient Medications:    aspirin EC 81 MG tablet, Take 81 mg by mouth daily., Disp: , Rfl:    atorvastatin (LIPITOR) 10 MG tablet, Take 1 tablet (10 mg total) by mouth daily., Disp: 90 tablet, Rfl: 3   Baclofen 5 MG TABS, Take 1 tablet (5 mg total) by mouth 3 (three) times daily as needed (spasticity)., Disp: 90 tablet, Rfl: 2   ciclopirox (PENLAC) 8 % solution, Apply topically at bedtime. Apply over nail and surrounding skin. Apply daily over previous coat. After seven (7) days, may remove with alcohol and continue cycle. (Patient not taking: Reported on 06/12/2023), Disp: 6.6 mL, Rfl: 0   diclofenac Sodium (VOLTAREN ARTHRITIS PAIN) 1 % GEL, Apply 2 g topically 4 (four) times daily., Disp: 50 g, Rfl: 0   fesoterodine (TOVIAZ) 8 MG TB24 tablet, Take 1 tablet (8 mg total) by  mouth daily., Disp: 90 tablet, Rfl: 3   mirabegron ER (MYRBETRIQ) 50 MG TB24 tablet, Take 1 tablet (50 mg total) by mouth daily., Disp: 90 tablet, Rfl: 2   Multiple Vitamin (MULTIVITAMIN) tablet, Take 1 tablet by mouth daily., Disp: , Rfl:    propranolol ER (INDERAL LA) 60 MG 24 hr capsule, Take 1 capsule (60 mg total) by mouth daily., Disp: 90 capsule, Rfl: 3   tamsulosin (FLOMAX) 0.4 MG CAPS capsule,  Take 0.4 mg by mouth daily., Disp: , Rfl:    traMADol (ULTRAM) 50 MG tablet, Take 50 mg by mouth every 6 (six) hours as needed., Disp: , Rfl:    warfarin (COUMADIN) 5 MG tablet, Take 1 tablet (5 mg total) by mouth daily at 4 PM., Disp: 90 tablet, Rfl: 3   zolpidem (AMBIEN) 5 MG tablet, Take 1 tablet (5 mg total) by mouth at bedtime as needed for sleep., Disp: 30 tablet, Rfl: 2  Review of Systems:    MSK: Patient reports right arm weakness  Physical Exam:  Vitals:   07/26/23 1451  BP: 111/70  Pulse: 70  Temp: 97.6 F (36.4 C)  TempSrc: Oral  SpO2: 100%  Weight: 239 lb 9.6 oz (108.7 kg)  Height: 6' (1.829 m)   General: Patient is sitting comfortably in the room  Cardio: Regular rate and rhythm, no murmurs, rubs or gallops Pulmonary: Clear to ausculation bilaterally with no rales, rhonchi, and crackles  MSK: Right upper extremity with negative liftoff test, negative drop arm test, negative empty can test.  Patient has full active and passive range of motion of his shoulder of his right upper extremity.  Right elbow with no obvious deformities and no obvious bony tenderness.  Negative valgus or varus stressing.  Intact strength to flexion and extension.  Wrist with normal exam as well. Sensation decreased at distal portion of the right upper extremity at 5th phalanx.   Assessment & Plan:   Osteoarthritis of both knees Patient has a past medical history of osteoarthritis of both his knees.  He states he has been dealing with this for some time.  He endorses that he is not a surgical candidate given that his history of CVA with left-sided deficits will impede his ability to rehab.  He therefore has not been offered surgical options yet.  Patient has had knee injections in the past which were not helpful.  He also reports having physical therapy in the past which did help slightly.  Plan: -Refer patient back to physical therapy -Voltaren gel  Prediabetes Patient has a past medical  history of prediabetes.  His most recent A1c in April 2023 was 5.9.  Today A1c 5.8.  Plan: -Patient encouraged to make lifestyle modifications  Weakness of right hand Patient endorses that 4-week history of right hand weakness.  He states this started about 4 weeks ago.  It started in his shoulder and described as an achy pain.  He states that his achiness radiated to his hand and his chest.  He states that he since has noticed numbness and tingling to his fifth digit on his right hand.  He states his mostly at the distal end of the fifth phalanx.  He states he has developed trouble with opening and envelope, using scissors, and writing.  He has no concerns with moving his arm to wash his hair or to scratch his back.  On my exam, he has a negative liftoff test, negative drop arm test, negative empty can test.  Patient  has full active and passive range of motion of his shoulder of his right upper extremity.  Elbow with no obvious deformities and no obvious bony tenderness.  Negative valgus or varus stressing.  Intact strength to flexion and extension.  Wrist with normal exam as well.  Likely this could be a Guyon canal syndrome.  This could also be related to ulnar entrapment at the elbow joint.  Unclear which 1.  Regardless, I think patient could benefit from physical therapy.  Plan: -Refer patient to physical therapy -Follow-up in 1 month   Patient discussed with Dr. Marquita Palms, DO PGY-2 Internal Medicine Resident  Pager: [phone]

## 2023-07-26 NOTE — Assessment & Plan Note (Signed)
Patient endorses that 4-week history of right hand weakness.  He states this started about 4 weeks ago.  It started in his shoulder and described as an achy pain.  He states that his achiness radiated to his hand and his chest.  He states that he since has noticed numbness and tingling to his fifth digit on his right hand.  He states his mostly at the distal end of the fifth phalanx.  He states he has developed trouble with opening and envelope, using scissors, and writing.  He has no concerns with moving his arm to wash his hair or to scratch his back.  On my exam, he has a negative liftoff test, negative drop arm test, negative empty can test.  Patient has full active and passive range of motion of his shoulder of his right upper extremity.  Elbow with no obvious deformities and no obvious bony tenderness.  Negative valgus or varus stressing.  Intact strength to flexion and extension.  Wrist with normal exam as well.  Likely this could be a Guyon canal syndrome.  This could also be related to ulnar entrapment at the elbow joint.  Unclear which 1.  Regardless, I think patient could benefit from physical therapy.  Plan: -Refer patient to physical therapy -Follow-up in 1 month

## 2023-07-26 NOTE — Assessment & Plan Note (Signed)
Patient has a past medical history of prediabetes.  His most recent A1c in April 2023 was 5.9.  Today A1c 5.8.  Plan: -Patient encouraged to make lifestyle modifications

## 2023-07-26 NOTE — Patient Instructions (Addendum)
[place], [place] you for allowing me to take part in your care today.  Here are your instructions.  1.  Regarding your hand weakness, we think this could be due to your cane.  It could be compressing one of your nerves that goes into your hand.  2.  I have referred you to physical therapy.  3.  Please come back in 1 month and we can discuss how your therapy is going   4.  Your A1c was 5.8.  Thank you, Dr. Allena Katz  If you have any other questions please contact the internal medicine clinic at [phone] If it is after hours, please call the [place] [facility] at [phone] and then ask the person who picks up for the resident on call.

## 2023-07-26 NOTE — Assessment & Plan Note (Signed)
Patient has a past medical history of osteoarthritis of both his knees.  He states he has been dealing with this for some time.  He endorses that he is not a surgical candidate given that his history of CVA with left-sided deficits will impede his ability to rehab.  He therefore has not been offered surgical options yet.  Patient has had knee injections in the past which were not helpful.  He also reports having physical therapy in the past which did help slightly.  Plan: -Refer patient back to physical therapy -Voltaren gel

## 2023-07-31 ENCOUNTER — Ambulatory Visit (INDEPENDENT_AMBULATORY_CARE_PROVIDER_SITE_OTHER): Payer: Medicare HMO | Admitting: Pharmacist

## 2023-07-31 DIAGNOSIS — Z7901 Long term (current) use of anticoagulants: Secondary | ICD-10-CM | POA: Diagnosis not present

## 2023-07-31 DIAGNOSIS — Z86718 Personal history of other venous thrombosis and embolism: Secondary | ICD-10-CM | POA: Diagnosis not present

## 2023-07-31 DIAGNOSIS — I639 Cerebral infarction, unspecified: Secondary | ICD-10-CM | POA: Diagnosis not present

## 2023-07-31 LAB — POCT INR: INR: 3.6 — AB (ref 2.0–3.0)

## 2023-07-31 NOTE — Patient Instructions (Signed)
Patient instructed to take medications as defined in the Anti-coagulation Track section of this encounter.  Patient instructed to take today's dose.  Patient instructed to take only one-half (1/2) tablet on Mondays, Wednesdays and Fridays. All other days, take one (1) tablet.  Patient verbalized understanding of these instructions.

## 2023-07-31 NOTE — Progress Notes (Signed)
 Evaluation and management procedures were performed by the Clinical Pharmacy Practitioner under my supervision and collaboration. I have reviewed the Practitioner's note and chart, and I agree with the management and plan as documented above.   Dickie La, MD

## 2023-07-31 NOTE — Progress Notes (Signed)
Internal Medicine Clinic Attending  Case discussed with the resident at the time of the visit.  We reviewed the resident's history and exam and pertinent patient test results.  I agree with the assessment, diagnosis, and plan of care documented in the resident's note. Patient uses a cane in right hand and places significant weight on that arm due to his knee pain; compressive neuropathy is suspected as cause of his symptoms.

## 2023-07-31 NOTE — Progress Notes (Signed)
Anticoagulation Management Alexander English is a 68 y.o. male who reports to the clinic for monitoring of warfarin treatment.    Indication:  History of thromboembolism, long term current use of oral anticoagulation with warfarin for an INR range 2.0 - 3.0   Duration: indefinite Supervising physician:  Dr. Dickie La  Anticoagulation Clinic Visit History: Patient does not report signs/symptoms of bleeding or thromboembolism  Other recent changes: No diet, medications, lifestyle changes except as noted in patient findings.  Anticoagulation Episode Summary     Current INR goal:  2.0-3.0  TTR:  81.3% (11.3 y)  Next INR check:  08/21/2023  INR from last check:  3.6 (07/31/2023)  Weekly max warfarin dose:  --  Target end date:  Indefinite  INR check location:  Anticoagulation Clinic  Preferred lab:  --  Send INR reminders to:  [facility]   Indications   History of venous thromboembolism [Z86.718]        Comments:  --         No Known Allergies  Current Outpatient Medications:    aspirin EC 81 MG tablet, Take 81 mg by mouth daily., Disp: , Rfl:    atorvastatin (LIPITOR) 10 MG tablet, Take 1 tablet (10 mg total) by mouth daily., Disp: 90 tablet, Rfl: 3   Baclofen 5 MG TABS, Take 1 tablet (5 mg total) by mouth 3 (three) times daily as needed (spasticity)., Disp: 90 tablet, Rfl: 2   ciclopirox (PENLAC) 8 % solution, Apply topically at bedtime. Apply over nail and surrounding skin. Apply daily over previous coat. After seven (7) days, may remove with alcohol and continue cycle. (Patient not taking: Reported on 07/31/2023), Disp: 6.6 mL, Rfl: 0   diclofenac Sodium (VOLTAREN ARTHRITIS PAIN) 1 % GEL, Apply 2 g topically 4 (four) times daily., Disp: 50 g, Rfl: 0   fesoterodine (TOVIAZ) 8 MG TB24 tablet, Take 1 tablet (8 mg total) by mouth daily., Disp: 90 tablet, Rfl: 3   mirabegron ER (MYRBETRIQ) 50 MG TB24 tablet, Take 1 tablet (50 mg total) by mouth daily., Disp: 90 tablet, Rfl: 2    Multiple Vitamin (MULTIVITAMIN) tablet, Take 1 tablet by mouth daily., Disp: , Rfl:    propranolol ER (INDERAL LA) 60 MG 24 hr capsule, Take 1 capsule (60 mg total) by mouth daily., Disp: 90 capsule, Rfl: 3   tamsulosin (FLOMAX) 0.4 MG CAPS capsule, Take 0.4 mg by mouth daily., Disp: , Rfl:    traMADol (ULTRAM) 50 MG tablet, Take 50 mg by mouth every 6 (six) hours as needed., Disp: , Rfl:    warfarin (COUMADIN) 5 MG tablet, Take 1 tablet (5 mg total) by mouth daily at 4 PM., Disp: 90 tablet, Rfl: 3   zolpidem (AMBIEN) 5 MG tablet, Take 1 tablet (5 mg total) by mouth at bedtime as needed for sleep., Disp: 30 tablet, Rfl: 2 Past Medical History:  Diagnosis Date   Aortic atherosclerosis (HCC) 10/23/2017   Asymptomatic, seen on CT scan   Bilateral cataracts 05/18/2016   Not visually significant   Cancer (HCC)    maxillary   Cerebrovascular accident (CVA) (HCC) 03/27/2020   Chronic constipation 06/12/2013   Chronic low back pain 06/12/2013   L4-5 facet arthropathy    Chronic pain of both shoulders 12/07/2013   A/C joint osteoarthritis    Cluster headaches    Resolved in 2006   COPD (chronic obstructive pulmonary disease) (HCC)    Dry eye syndrome, bilateral 05/18/2016   Embolic stroke involving  right middle cerebral artery (HCC) 08/08/2004   Occured after traveling from [place] to [place] where he was hospitalized for 1 month with no further work-up or therapy.  Returned to [place] unable to walk and was admitted to [facility] immediately after landing. Presumed embolic per neuro but TEE, bubble study, and hypercoag W/U negative. On indefinite coumadin per patient's informed preference.  Residual effects : Left spastic hemiplegia. Tx with phenol tibi   Generalized anxiety disorder 08/03/2018   History of kidney stones    Hyperplastic colon polyp 07/27/2012   Excised endoscopically 07/27/2012   Hypertensive retinopathy of both eyes 05/18/2016   Internal and external hemorrhoids without complication  12/19/2017   Seen on colonoscopy 04/03/2007.   Left nephrolithiasis 08/31/2015   With mild left hydronephrosis.  Scheduled to undergo shockwave lithotripsy.   Obesity (BMI 30.0-34.9) 12/07/2013   Osteoarthritis of both knees 01/26/2012   Positive PPD 12/07/2013   Pulmonary embolism (HCC) 09/30/2004   Diagnosed in April 2006 after returning from [place].  Likely provoked as it occurred after a 12 hour plane flight within 2 months of R MCA CVA with left hemiparesis. Patient has made an informed decision to remain on lifelong warfarin.    Tubular adenoma 12/19/2017   Endoscopically excised 04/03/2007.   Urge incontinence of urine 08/31/2015   Possibly secondary to prior CVA.  Treated with Myrbetriq.   Social History   Socioeconomic History   Marital status: Single    Spouse name: Not on file   Number of children: 3   Years of education: 16   Highest education level: Not on file  Occupational History   Occupation: Retired  Tobacco Use   Smoking status: Former    Current packs/day: 0.00    Types: Cigarettes    Quit date: 06/28/2007    Years since quitting: 16.1   Smokeless tobacco: Never  Vaping Use   Vaping status: Never Used  Substance and Sexual Activity   Alcohol use: Yes    Comment: Rarely.   Drug use: No   Sexual activity: Not on file  Other Topics Concern   Not on file  Social History Narrative   Born in [place] but has been in the [place] for > 30 years.  Divorced, 1 daughter and 2 sons.  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   Right handed   Coffee/tea sometimes, soda rarely   Social Drivers of Health   Financial Resource Strain: Low Risk  (08/15/2022)   Overall Financial Resource Strain (CARDIA)    Difficulty of Paying Living Expenses: Not hard at all  Food Insecurity: Low Risk  (10/07/2022)   [facility]   Hunger Vital Sign    Worried About Running Out of Food in the Last Year: Never true     Ran Out of Food in the Last Year: Never true  Transportation Needs: Not on file (10/07/2022)  Physical Activity: Inactive (01/17/2019)   Exercise Vital Sign    Days of Exercise per Week: 0 days    Minutes of Exercise per Session: 0 min  Stress: No Stress Concern Present (01/17/2019)   [employer] of Occupational Health - Occupational Stress Questionnaire    Feeling of Stress : Only a little  Social Connections: Moderately Isolated (08/15/2022)   Social Connection and Isolation Panel [NHANES]    Frequency of Communication with Friends and Family: More than three times a week    Frequency of Social  Gatherings with Friends and Family: More than three times a week    Attends Religious Services: More than 4 times per year    Active Member of [employer] or Organizations: No    Attends Engineer, structural: Never    Marital Status: Divorced   Family History  Problem Relation Age of Onset   Stroke Maternal Grandmother    Unexplained death Mother    Depression Mother        After husband's death   Unexplained death Father    Osteoarthritis Father        Knees   Early death Sister    Seizures Sister    Early death Brother 2       Unknown cause   Healthy Daughter    Healthy Son    Stroke Maternal Aunt    Healthy Sister    Healthy Sister    Unexplained death Brother    Healthy Brother    Healthy Son     ASSESSMENT Recent Results: The most recent result is correlated with 30 mg per week: Lab Results  Component Value Date   INR 3.6 (A) 07/31/2023   INR 3.1 (A) 06/12/2023   INR 4.6 (A) 05/29/2023    Anticoagulation Dosing: Description   Take only one-half (1/2) tablet on Mondays, Wednesdays and Fridays. All other days, take one (1) tablet.      INR today: Supratherapeutic  PLAN Weekly dose was decreased by 8% to 27.5 mg per week  Patient Instructions  Patient instructed to take medications as defined in the Anti-coagulation Track section of this encounter.   Patient instructed to take today's dose.  Patient instructed to take only one-half (1/2) tablet on Mondays, Wednesdays and Fridays. All other days, take one (1) tablet.  Patient verbalized understanding of these instructions.  Patient advised to contact clinic or seek medical attention if signs/symptoms of bleeding or thromboembolism occur.  Patient verbalized understanding by repeating back information and was advised to contact me if further medication-related questions arise. Patient was also provided an information handout.  Follow-up Return in 3 weeks (on 08/21/2023) for Follow up INR.  Elicia Lamp, PharmD, CPP  15 minutes spent face-to-face with the patient during the encounter. 50% of time spent on education, including signs/sx bleeding and clotting, as well as food and drug interactions with warfarin. 50% of time was spent on fingerprick POC INR sample collection,processing, results determination, and documentation in [email or web address].

## 2023-08-02 ENCOUNTER — Other Ambulatory Visit: Payer: Self-pay | Admitting: Student

## 2023-08-02 DIAGNOSIS — R29898 Other symptoms and signs involving the musculoskeletal system: Secondary | ICD-10-CM

## 2023-08-15 ENCOUNTER — Ambulatory Visit: Payer: Medicare HMO | Admitting: Occupational Therapy

## 2023-08-15 ENCOUNTER — Ambulatory Visit: Payer: Medicare HMO | Admitting: Physical Therapy

## 2023-08-17 ENCOUNTER — Encounter: Payer: Medicare HMO | Admitting: Internal Medicine

## 2023-08-21 ENCOUNTER — Ambulatory Visit: Payer: Medicare HMO | Admitting: Pharmacist

## 2023-08-21 ENCOUNTER — Ambulatory Visit (INDEPENDENT_AMBULATORY_CARE_PROVIDER_SITE_OTHER): Payer: Medicare HMO | Admitting: Internal Medicine

## 2023-08-21 ENCOUNTER — Encounter: Payer: Self-pay | Admitting: Internal Medicine

## 2023-08-21 ENCOUNTER — Ambulatory Visit: Payer: Medicare HMO

## 2023-08-21 VITALS — BP 128/80 | HR 67 | Temp 97.7°F | Ht 72.0 in | Wt 236.6 lb

## 2023-08-21 DIAGNOSIS — G252 Other specified forms of tremor: Secondary | ICD-10-CM | POA: Diagnosis not present

## 2023-08-21 DIAGNOSIS — Z86711 Personal history of pulmonary embolism: Secondary | ICD-10-CM

## 2023-08-21 DIAGNOSIS — J439 Emphysema, unspecified: Secondary | ICD-10-CM | POA: Diagnosis not present

## 2023-08-21 DIAGNOSIS — N3941 Urge incontinence: Secondary | ICD-10-CM

## 2023-08-21 DIAGNOSIS — Z7901 Long term (current) use of anticoagulants: Secondary | ICD-10-CM | POA: Diagnosis not present

## 2023-08-21 DIAGNOSIS — D369 Benign neoplasm, unspecified site: Secondary | ICD-10-CM | POA: Diagnosis not present

## 2023-08-21 DIAGNOSIS — Z0000 Encounter for general adult medical examination without abnormal findings: Secondary | ICD-10-CM

## 2023-08-21 DIAGNOSIS — I639 Cerebral infarction, unspecified: Secondary | ICD-10-CM | POA: Diagnosis not present

## 2023-08-21 DIAGNOSIS — Z1211 Encounter for screening for malignant neoplasm of colon: Secondary | ICD-10-CM

## 2023-08-21 DIAGNOSIS — G8110 Spastic hemiplegia affecting unspecified side: Secondary | ICD-10-CM | POA: Diagnosis not present

## 2023-08-21 DIAGNOSIS — I700 Atherosclerosis of aorta: Secondary | ICD-10-CM

## 2023-08-21 DIAGNOSIS — Z86718 Personal history of other venous thrombosis and embolism: Secondary | ICD-10-CM | POA: Diagnosis not present

## 2023-08-21 DIAGNOSIS — E785 Hyperlipidemia, unspecified: Secondary | ICD-10-CM

## 2023-08-21 LAB — POCT INR: INR: 2.1 (ref 2.0–3.0)

## 2023-08-21 MED ORDER — PROPRANOLOL HCL ER 60 MG PO CP24: 60.0000 mg | ORAL_CAPSULE | Freq: Every day | ORAL | 3 refills | Status: AC

## 2023-08-21 MED ORDER — ATORVASTATIN CALCIUM 10 MG PO TABS: 10.0000 mg | ORAL_TABLET | Freq: Every day | ORAL | 3 refills | Status: AC

## 2023-08-21 MED ORDER — MIRABEGRON ER 50 MG PO TB24: 50.0000 mg | ORAL_TABLET | Freq: Every day | ORAL | 3 refills | Status: AC

## 2023-08-21 MED ORDER — FESOTERODINE FUMARATE ER 8 MG PO TB24: 8.0000 mg | ORAL_TABLET | Freq: Every day | ORAL | 3 refills | Status: AC

## 2023-08-21 MED ORDER — BENZOYL PEROXIDE-ERYTHROMYCIN 5-3 % EX GEL: CUTANEOUS | 1 refills | Status: AC

## 2023-08-21 NOTE — Patient Instructions (Signed)
 Patient instructed to take medications as defined in the Anti-coagulation Track section of this encounter.  Patient instructed to take today's dose.  Patient instructed to take only one-half (1/2) tablet on Wednesdays and Fridays. All other days, take one (1) tablet. Patient verbalized understanding of these instructions.

## 2023-08-21 NOTE — Progress Notes (Signed)
 Subjective:   Alexander English is a 68 y.o. male who presents for Medicare Annual/Subsequent preventive examination.  Visit Complete: In person.  Defer to PCP.      Objective:    Today's Vitals   08/21/23 1136  BP: 128/80  Pulse: 67  Temp: 97.7 F (36.5 C)  TempSrc: Oral  SpO2: 100%  Weight: 236 lb 9.6 oz (107.3 kg)  Height: 6' (1.829 m)  PainSc: 2    Body mass index is 32.09 kg/m.     08/21/2023   11:34 AM 08/21/2023   10:58 AM 07/26/2023    2:53 PM 08/15/2022    3:02 PM 08/15/2022    9:17 AM 07/15/2022   11:08 AM 05/05/2022   11:34 AM  Advanced Directives  Does Patient Have a Medical Advance Directive? No No No Yes Yes Yes Yes  Type of Theme park manager;Living will Healthcare Power of [place];Living will Healthcare Power of [place];Living will Healthcare Power of [place];Living will  Does patient want to make changes to medical advance directive?     No - Patient declined No - Patient declined No - Patient declined  Copy of Healthcare Power of Attorney in Chart?    No - copy requested No - copy requested  No - copy requested  Would patient like information on creating a medical advance directive? No - Patient declined No - Patient declined No - Patient declined        Current Medications (verified) Outpatient Encounter Medications as of 08/21/2023  Medication Sig   aspirin EC 81 MG tablet Take 81 mg by mouth daily.   Baclofen 5 MG TABS Take 1 tablet (5 mg total) by mouth 3 (three) times daily as needed (spasticity).   ciclopirox (PENLAC) 8 % solution Apply topically at bedtime. Apply over nail and surrounding skin. Apply daily over previous coat. After seven (7) days, may remove with alcohol and continue cycle. (Patient not taking: Reported on 06/12/2023)   diclofenac Sodium (VOLTAREN ARTHRITIS PAIN) 1 % GEL Apply 2 g topically 4 (four) times daily.   Multiple Vitamin (MULTIVITAMIN) tablet Take 1 tablet by mouth daily.   tamsulosin  (FLOMAX) 0.4 MG CAPS capsule Take 0.4 mg by mouth daily.   traMADol (ULTRAM) 50 MG tablet Take 50 mg by mouth every 6 (six) hours as needed.   warfarin (COUMADIN) 5 MG tablet Take 1 tablet (5 mg total) by mouth daily at 4 PM.   zolpidem (AMBIEN) 5 MG tablet Take 1 tablet (5 mg total) by mouth at bedtime as needed for sleep.   [DISCONTINUED] atorvastatin (LIPITOR) 10 MG tablet Take 1 tablet (10 mg total) by mouth daily.   [DISCONTINUED] fesoterodine (TOVIAZ) 8 MG TB24 tablet Take 1 tablet (8 mg total) by mouth daily.   [DISCONTINUED] mirabegron ER (MYRBETRIQ) 50 MG TB24 tablet Take 1 tablet (50 mg total) by mouth daily.   [DISCONTINUED] propranolol ER (INDERAL LA) 60 MG 24 hr capsule Take 1 capsule (60 mg total) by mouth daily.   No facility-administered encounter medications on file as of 08/21/2023.    Allergies (verified) Patient has no known allergies.   History: Past Medical History:  Diagnosis Date   Aortic atherosclerosis (HCC) 10/23/2017   Asymptomatic, seen on CT scan   Bilateral cataracts 05/18/2016   Not visually significant   Cancer (HCC)    maxillary   Cerebrovascular accident (CVA) (HCC) 03/27/2020   Chronic constipation 06/12/2013   Chronic low back pain 06/12/2013  L4-5 facet arthropathy    Chronic pain of both shoulders 12/07/2013   A/C joint osteoarthritis    Cluster headaches    Resolved in 2006   COPD (chronic obstructive pulmonary disease) (HCC)    Dry eye syndrome, bilateral 05/18/2016   Embolic stroke involving right middle cerebral artery (HCC) 08/08/2004   Occured after traveling from [place] to [place] where he was hospitalized for 1 month with no further work-up or therapy.  Returned to [place] unable to walk and was admitted to [facility] immediately after landing. Presumed embolic per neuro but TEE, bubble study, and hypercoag W/U negative. On indefinite coumadin per patient's informed preference.  Residual effects : Left spastic hemiplegia. Tx with phenol tibi    Generalized anxiety disorder 08/03/2018   History of kidney stones    Hyperplastic colon polyp 07/27/2012   Excised endoscopically 07/27/2012   Hypertensive retinopathy of both eyes 05/18/2016   Internal and external hemorrhoids without complication 12/19/2017   Seen on colonoscopy 04/03/2007.   Left nephrolithiasis 08/31/2015   With mild left hydronephrosis.  Scheduled to undergo shockwave lithotripsy.   Obesity (BMI 30.0-34.9) 12/07/2013   Osteoarthritis of both knees 01/26/2012   Positive PPD 12/07/2013   Pulmonary embolism (HCC) 09/30/2004   Diagnosed in April 2006 after returning from [place].  Likely provoked as it occurred after a 12 hour plane flight within 2 months of R MCA CVA with left hemiparesis. Patient has made an informed decision to remain on lifelong warfarin.    Tubular adenoma 12/19/2017   Endoscopically excised 04/03/2007.   Urge incontinence of urine 08/31/2015   Possibly secondary to prior CVA.  Treated with Myrbetriq.   Past Surgical History:  Procedure Laterality Date   CYSTOSCOPY W/ RETROGRADES Right 02/22/2021   Procedure: CYSTOSCOPY WITH RETROGRADE PYELOGRAM/ URETEROSCOPY/ POSSIBLE BIOPSY OF LESION, POSSIBLE RIGHT STENT PLACEMENT;  Surgeon: Jannifer Hick, MD;  Location: [facility] ORS;  Service: Urology;  Laterality: Right;  ONLY NEEDS 30 MIN   EXCISION NASAL MASS Left 10/06/2017   Procedure: EXCISION LEFT NASAL MASS;  Surgeon: Serena Colonel, MD;  Location: [facility] OR;  Service: ENT;  Laterality: Left;  Removal of intvering papilloma frozen section   FOOT SURGERY Bilateral    Bunion   I & D EXTREMITY Left 11/28/2001   Left thumb thenar space abscess.   LITHOTRIPSY  2017   MAXILLECTOMY Left 11/22/2017   Hattie Perch 11/22/2017   MAXILLECTOMY Left 11/22/2017   Procedure: MAXILLECTOMY;  Surgeon: Serena Colonel, MD;  Location: [facility] OR;  Service: ENT;  Laterality: Left;   NASAL SINUS SURGERY Left 10/06/2017   Procedure: ENDOSCOPIC SINUS SURGERY;  Surgeon: Serena Colonel, MD;  Location: [facility]  OR;  Service: ENT;  Laterality: Left;  Left side Endoscopic Macillary and Ethmoid Sinus   SKIN SPLIT GRAFT Left 11/22/2017   Procedure: SKIN GRAFT SPLIT THICKNESS;  Surgeon: Serena Colonel, MD;  Location: [facility] OR;  Service: ENT;  Laterality: Left;   Family History  Problem Relation Age of Onset   Stroke Maternal Grandmother    Unexplained death Mother    Depression Mother        After husband's death   Unexplained death Father    Osteoarthritis Father        Knees   Early death Sister    Seizures Sister    Early death Brother 61       Unknown cause   Healthy Daughter    Healthy Son    Stroke Maternal Aunt    Healthy Sister  Healthy Sister    Unexplained death Brother    Healthy Brother    Healthy Son    Social History   Socioeconomic History   Marital status: Single    Spouse name: Not on file   Number of children: 3   Years of education: 16   Highest education level: Not on file  Occupational History   Occupation: Retired  Tobacco Use   Smoking status: Former    Current packs/day: 0.00    Types: Cigarettes    Quit date: 06/28/2007    Years since quitting: 16.1   Smokeless tobacco: Never  Vaping Use   Vaping status: Never Used  Substance and Sexual Activity   Alcohol use: Yes    Comment: Rarely.   Drug use: No   Sexual activity: Not on file  Other Topics Concern   Not on file  Social History Narrative   Born in [place] but has been in the [place] for > 30 years.  Divorced, 1 daughter and 2 sons.  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   Right handed   Coffee/tea sometimes, soda rarely   Social Drivers of Health   Financial Resource Strain: Low Risk  (08/21/2023)   Overall Financial Resource Strain (CARDIA)    Difficulty of Paying Living Expenses: Not hard at all  Food Insecurity: No Food Insecurity (08/21/2023)   Hunger Vital Sign    Worried About Running Out of Food in the Last Year: Never true    Ran Out of Food in  the Last Year: Never true  Transportation Needs: No Transportation Needs (08/21/2023)   PRAPARE - Administrator, Civil Service (Medical): No    Lack of Transportation (Non-Medical): No  Physical Activity: Inactive (01/17/2019)   Exercise Vital Sign    Days of Exercise per Week: 0 days    Minutes of Exercise per Session: 0 min  Stress: No Stress Concern Present (08/21/2023)   [employer] of Occupational Health - Occupational Stress Questionnaire    Feeling of Stress : Not at all  Social Connections: Moderately Isolated (08/21/2023)   Social Connection and Isolation Panel [NHANES]    Frequency of Communication with Friends and Family: More than three times a week    Frequency of Social Gatherings with Friends and Family: More than three times a week    Attends Religious Services: More than 4 times per year    Active Member of [employer] or Organizations: No    Attends Engineer, structural: Never    Marital Status: Divorced    Tobacco Counseling Counseling given: Not Answered   Clinical Intake:  Pre-visit preparation completed: Yes  Pain : 0-10 Pain Score: 2  Pain Type: Acute pain, Chronic pain Pain Location: Back (knee) Pain Orientation: Lower Pain Descriptors / Indicators: Aching Pain Onset: More than a month ago Pain Frequency: Constant     Diabetes: No  How often do you need to have someone help you when you read instructions, pamphlets, or other written materials from your doctor or pharmacy?: 1 - Never  Interpreter Needed?: No      Activities of Daily Living    08/21/2023   11:29 AM  In your present state of health, do you have any difficulty performing the following activities:  Hearing? 0  Vision? 0  Difficulty concentrating or making decisions? 0  Walking or climbing stairs? 1  Dressing or bathing? 0  Doing errands, shopping? 0  Preparing Food and eating ? N  Using the Toilet? N  In the past six months, have you accidently leaked  urine? N  Do you have problems with loss of bowel control? N  Managing your Medications? N  Managing your Finances? N  Housekeeping or managing your Housekeeping? N    Patient Care Team: Gust Rung, DO as PCP - General (Internal Medicine) Serena Colonel, MD as Consulting Physician (Otolaryngology) Candelaria Stagers, DPM as Consulting Physician (Podiatry) Jannifer Hick, MD as Consulting Physician (Urology)  Indicate any recent Medical Services you may have received from other than [facility] providers in the past year (date may be approximate).     Assessment:   This is a routine wellness examination for Qamar.  Hearing/Vision screen No results found.   Goals Addressed   None   Depression Screen    08/21/2023   11:36 AM 08/21/2023   11:32 AM 08/21/2023   10:58 AM 08/15/2022    9:18 AM 05/05/2022   11:34 AM 10/14/2021   10:51 AM 04/15/2021   10:57 AM  PHQ 2/9 Scores  PHQ - 2 Score 0 0 0 0 0 0 2  PHQ- 9 Score    2   4    Fall Risk    08/21/2023   11:35 AM 08/21/2023   10:57 AM 08/15/2022    9:18 AM 05/05/2022   11:34 AM 10/14/2021   10:51 AM  Fall Risk   Falls in the past year? 1 1 0 0 0  Number falls in past yr: 0 0  0 0  Injury with Fall? 0 0  0 0  Risk for fall due to : Impaired balance/gait Impaired balance/gait Impaired balance/gait Impaired balance/gait No Fall Risks  Follow up Falls evaluation completed;Falls prevention discussed Falls prevention discussed Falls evaluation completed Falls evaluation completed;Falls prevention discussed Falls evaluation completed;Falls prevention discussed    MEDICARE RISK AT HOME: Medicare Risk at Home Any stairs in or around the home?: Yes If so, are there any without handrails?: Yes Home free of loose throw rugs in walkways, pet beds, electrical cords, etc?: No Adequate lighting in your home to reduce risk of falls?: Yes Life alert?: No Use of a cane, walker or w/c?: Yes Grab bars in the bathroom?: Yes Shower chair or bench in  shower?: Yes Elevated toilet seat or a handicapped toilet?: Yes  TIMED UP AND GO:  Was the test performed?  No    Cognitive Function:    03/27/2020   12:08 PM  MMSE - Mini Mental State Exam  Orientation to time 5  Orientation to Place 5  Registration 3  Attention/ Calculation 5  Recall 2  Language- name 2 objects 2  Language- repeat 1  Language- follow 3 step command 3  Language- read & follow direction 1  Write a sentence 1  Copy design 1  Total score 29        08/21/2023   11:37 AM  6CIT Screen  What Year? 0 points  What month? 0 points  What time? 0 points  Count back from 20 0 points  Months in reverse 0 points  Repeat phrase 0 points  Total Score 0 points    Immunizations Immunization History  Administered Date(s) Administered   Fluad Quad(high Dose 65+) 03/22/2021   Influenza Split 05/07/2012   Influenza, High Dose Seasonal PF 03/31/2022, 04/06/2023   Influenza,inj,Quad PF,6+ Mos 03/22/2013, 04/02/2014, 03/09/2015, 02/15/2016, 03/31/2017, 03/16/2018, 03/11/2019, 03/09/2020   Moderna Covid-19 Vaccine Bivalent  Booster 59yrs & up 05/04/2021   PFIZER(Purple Top)SARS-COV-2 Vaccination 04/25/2020   PNEUMOCOCCAL CONJUGATE-20 12/24/2022   Pneumococcal Conjugate-13 08/03/2018   Pneumococcal Polysaccharide-23 10/17/2019   Td 01/24/2005   Tdap 03/27/2015, 12/24/2022   Unspecified SARS-COV-2 Vaccination 03/31/2022   Zoster Recombinant(Shingrix) 12/24/2022   Zoster, Live 06/01/2016    TDAP status: Up to date  Flu Vaccine status: Up to date  Pneumococcal vaccine status: Up to date  Covid-19 vaccine status: Information provided on how to obtain vaccines.   Qualifies for Shingles Vaccine? Yes   Zostavax completed No   Shingrix Completed?: No.    Education has been provided regarding the importance of this vaccine. Patient has been advised to call insurance company to determine out of pocket expense if they have not yet received this vaccine. Advised may also  receive vaccine at local pharmacy or Health Dept. Verbalized acceptance and understanding.  Screening Tests Health Maintenance  Topic Date Due   Zoster Vaccines- Shingrix (2 of 2) 02/18/2023   COVID-19 Vaccine (4 - 2024-25 season) 02/26/2023   Colonoscopy  05/24/2023   Medicare Annual Wellness (AWV)  08/20/2024   DTaP/Tdap/Td (4 - Td or Tdap) 12/23/2032   Pneumonia Vaccine 74+ Years old  Completed   INFLUENZA VACCINE  Completed   Hepatitis C Screening  Completed   HPV VACCINES  Aged Out    Health Maintenance  Health Maintenance Due  Topic Date Due   Zoster Vaccines- Shingrix (2 of 2) 02/18/2023   COVID-19 Vaccine (4 - 2024-25 season) 02/26/2023   Colonoscopy  05/24/2023    Colorectal cancer screening: Type of screening: Colonoscopy. Completed 05/23/2018. Repeat every 10 years  Lung Cancer Screening: (Low Dose CT Chest recommended if Age 79-80 years, 20 pack-year currently smoking OR have quit w/in 15years.) does not qualify.    Additional Screening:  Hepatitis C Screening: does qualify; Completed 03/27/2015  Vision Screening: Recommended annual ophthalmology exams for early detection of glaucoma and other disorders of the eye. Is the patient up to date with their annual eye exam?  Yes  Who is the provider or what is the name of the office in which the patient attends annual eye exams? Dr. Elmer Picker.  If pt is not established with a provider, would they like to be referred to a provider to establish care? No .   Dental Screening: Recommended annual dental exams for proper oral hygiene   Community Resource Referral / Chronic Care Management: CRR required this visit?  No   CCM required this visit?  No     Plan:     I have personally reviewed and noted the following in the patient's chart:   Medical and social history Use of alcohol, tobacco or illicit drugs  Current medications and supplements including opioid prescriptions. Patient is not currently taking opioid  prescriptions. Functional ability and status Nutritional status Physical activity Advanced directives List of other physicians Hospitalizations, surgeries, and ER visits in previous 12 months Vitals Screenings to include cognitive, depression, and falls Referrals and appointments  In addition, I have reviewed and discussed with patient certain preventive protocols, quality metrics, and best practice recommendations. A written personalized care plan for preventive services as well as general preventive health recommendations were provided to patient.     Adreonna Yontz, CMA   08/21/2023   After Visit Summary: (In Person-Printed) AVS printed and given to the patient  Nurse Notes: Face to Face, 20 minutes.  Mr. Reierson , Thank you for taking time to come for your Medicare  Wellness Visit. I appreciate your ongoing commitment to your health goals. Please review the following plan we discussed and let me know if I can assist you in the future.   These are the goals we discussed:  Goals      Weight < 185 lb (83.915 kg)        This is a list of the screening recommended for you and due dates:  Health Maintenance  Topic Date Due   Zoster (Shingles) Vaccine (2 of 2) 02/18/2023   COVID-19 Vaccine (4 - 2024-25 season) 02/26/2023   Colon Cancer Screening  05/24/2023   Medicare Annual Wellness Visit  08/20/2024   DTaP/Tdap/Td vaccine (4 - Td or Tdap) 12/23/2032   Pneumonia Vaccine  Completed   Flu Shot  Completed   Hepatitis C Screening  Completed   HPV Vaccine  Aged Out

## 2023-08-21 NOTE — Assessment & Plan Note (Signed)
 Requesting refills of myrbetriq and Alexander English

## 2023-08-21 NOTE — Progress Notes (Signed)
 Anticoagulation Management Alexander English is a 68 y.o. male who reports to the clinic for monitoring of warfarin treatment.    Indication: PE , History of; CVA, History of; long term current use of oral anticoagulation with warfarin for targt INR range 2.0 - 3.0.  Duration: indefinite Supervising physician: Carlynn Purl  Anticoagulation Clinic Visit History: Patient does not report signs/symptoms of bleeding or thromboembolism  Other recent changes: No diet, medications, lifestyle changes identified or cited to me by the patient during my visit.  Anticoagulation Episode Summary     Current INR goal:  2.0-3.0  TTR:  81.1% (11.4 y)  Next INR check:  09/18/2023  INR from last check:  2.1 (08/21/2023)  Weekly max warfarin dose:  --  Target end date:  Indefinite  INR check location:  Anticoagulation Clinic  Preferred lab:  --  Send INR reminders to:  [facility]   Indications   History of venous thromboembolism [Z86.718]        Comments:  --         No Known Allergies  Current Outpatient Medications:    aspirin EC 81 MG tablet, Take 81 mg by mouth daily., Disp: , Rfl:    atorvastatin (LIPITOR) 10 MG tablet, Take 1 tablet (10 mg total) by mouth daily., Disp: 90 tablet, Rfl: 3   Baclofen 5 MG TABS, Take 1 tablet (5 mg total) by mouth 3 (three) times daily as needed (spasticity)., Disp: 90 tablet, Rfl: 2   diclofenac Sodium (VOLTAREN ARTHRITIS PAIN) 1 % GEL, Apply 2 g topically 4 (four) times daily., Disp: 50 g, Rfl: 0   fesoterodine (TOVIAZ) 8 MG TB24 tablet, Take 1 tablet (8 mg total) by mouth daily., Disp: 90 tablet, Rfl: 3   mirabegron ER (MYRBETRIQ) 50 MG TB24 tablet, Take 1 tablet (50 mg total) by mouth daily., Disp: 90 tablet, Rfl: 2   Multiple Vitamin (MULTIVITAMIN) tablet, Take 1 tablet by mouth daily., Disp: , Rfl:    propranolol ER (INDERAL LA) 60 MG 24 hr capsule, Take 1 capsule (60 mg total) by mouth daily., Disp: 90 capsule, Rfl: 3   tamsulosin (FLOMAX) 0.4 MG  CAPS capsule, Take 0.4 mg by mouth daily., Disp: , Rfl:    traMADol (ULTRAM) 50 MG tablet, Take 50 mg by mouth every 6 (six) hours as needed., Disp: , Rfl:    warfarin (COUMADIN) 5 MG tablet, Take 1 tablet (5 mg total) by mouth daily at 4 PM., Disp: 90 tablet, Rfl: 3   zolpidem (AMBIEN) 5 MG tablet, Take 1 tablet (5 mg total) by mouth at bedtime as needed for sleep., Disp: 30 tablet, Rfl: 2   ciclopirox (PENLAC) 8 % solution, Apply topically at bedtime. Apply over nail and surrounding skin. Apply daily over previous coat. After seven (7) days, may remove with alcohol and continue cycle. (Patient not taking: Reported on 06/12/2023), Disp: 6.6 mL, Rfl: 0 Past Medical History:  Diagnosis Date   Aortic atherosclerosis (HCC) 10/23/2017   Asymptomatic, seen on CT scan   Bilateral cataracts 05/18/2016   Not visually significant   Cancer (HCC)    maxillary   Cerebrovascular accident (CVA) (HCC) 03/27/2020   Chronic constipation 06/12/2013   Chronic low back pain 06/12/2013   L4-5 facet arthropathy    Chronic pain of both shoulders 12/07/2013   A/C joint osteoarthritis    Cluster headaches    Resolved in 2006   COPD (chronic obstructive pulmonary disease) (HCC)    Dry eye syndrome, bilateral 05/18/2016  Embolic stroke involving right middle cerebral artery (HCC) 08/08/2004   Occured after traveling from [place] to [place] where he was hospitalized for 1 month with no further work-up or therapy.  Returned to [place] unable to walk and was admitted to [facility] immediately after landing. Presumed embolic per neuro but TEE, bubble study, and hypercoag W/U negative. On indefinite coumadin per patient's informed preference.  Residual effects : Left spastic hemiplegia. Tx with phenol tibi   Generalized anxiety disorder 08/03/2018   History of kidney stones    Hyperplastic colon polyp 07/27/2012   Excised endoscopically 07/27/2012   Hypertensive retinopathy of both eyes 05/18/2016   Internal and external hemorrhoids  without complication 12/19/2017   Seen on colonoscopy 04/03/2007.   Left nephrolithiasis 08/31/2015   With mild left hydronephrosis.  Scheduled to undergo shockwave lithotripsy.   Obesity (BMI 30.0-34.9) 12/07/2013   Osteoarthritis of both knees 01/26/2012   Positive PPD 12/07/2013   Pulmonary embolism (HCC) 09/30/2004   Diagnosed in April 2006 after returning from [place].  Likely provoked as it occurred after a 12 hour plane flight within 2 months of R MCA CVA with left hemiparesis. Patient has made an informed decision to remain on lifelong warfarin.    Tubular adenoma 12/19/2017   Endoscopically excised 04/03/2007.   Urge incontinence of urine 08/31/2015   Possibly secondary to prior CVA.  Treated with Myrbetriq.   Social History   Socioeconomic History   Marital status: Single    Spouse name: Not on file   Number of children: 3   Years of education: 16   Highest education level: Not on file  Occupational History   Occupation: Retired  Tobacco Use   Smoking status: Former    Current packs/day: 0.00    Types: Cigarettes    Quit date: 06/28/2007    Years since quitting: 16.1   Smokeless tobacco: Never  Vaping Use   Vaping status: Never Used  Substance and Sexual Activity   Alcohol use: Yes    Comment: Rarely.   Drug use: No   Sexual activity: Not on file  Other Topics Concern   Not on file  Social History Narrative   Born in [place] but has been in the [place] for > 30 years.  Divorced, 1 daughter and 2 sons.  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   Right handed   Coffee/tea sometimes, soda rarely   Social Drivers of Health   Financial Resource Strain: Low Risk  (08/15/2022)   Overall Financial Resource Strain (CARDIA)    Difficulty of Paying Living Expenses: Not hard at all  Food Insecurity: Low Risk  (10/07/2022)   [facility]   Hunger Vital Sign    Worried About Running Out of Food in the  Last Year: Never true    Ran Out of Food in the Last Year: Never true  Transportation Needs: Not on file (10/07/2022)  Physical Activity: Inactive (01/17/2019)   Exercise Vital Sign    Days of Exercise per Week: 0 days    Minutes of Exercise per Session: 0 min  Stress: No Stress Concern Present (01/17/2019)   [employer] of Occupational Health - Occupational Stress Questionnaire    Feeling of Stress : Only a little  Social Connections: Moderately Isolated (08/15/2022)   Social Connection and Isolation Panel [NHANES]    Frequency of Communication with Friends and Family: More than three times a week  Frequency of Social Gatherings with Friends and Family: More than three times a week    Attends Religious Services: More than 4 times per year    Active Member of [employer] or Organizations: No    Attends Engineer, structural: Never    Marital Status: Divorced   Family History  Problem Relation Age of Onset   Stroke Maternal Grandmother    Unexplained death Mother    Depression Mother        After husband's death   Unexplained death Father    Osteoarthritis Father        Knees   Early death Sister    Seizures Sister    Early death Brother 79       Unknown cause   Healthy Daughter    Healthy Son    Stroke Maternal Aunt    Healthy Sister    Healthy Sister    Unexplained death Brother    Healthy Brother    Healthy Son     ASSESSMENT Recent Results: The most recent result is correlated with 25 mg per week: Lab Results  Component Value Date   INR 2.1 08/21/2023   INR 3.6 (A) 07/31/2023   INR 3.1 (A) 06/12/2023    Anticoagulation Dosing: Description   Take only one-half (1/2) tablet on Wednesdays and Fridays. All other days, take one (1) tablet.      INR today: Therapeutic  PLAN Weekly dose was increased by 9% to 30 mg per week  Patient Instructions  Patient instructed to take medications as defined in the Anti-coagulation Track section of this encounter.   Patient instructed to take today's dose.  Patient instructed to take only one-half (1/2) tablet on Wednesdays and Fridays. All other days, take one (1) tablet. Patient verbalized understanding of these instructions.  Patient advised to contact clinic or seek medical attention if signs/symptoms of bleeding or thromboembolism occur.  Patient verbalized understanding by repeating back information and was advised to contact me if further medication-related questions arise. Patient was also provided an information handout.  Follow-up Return in 4 weeks (on 09/18/2023) for Follow up INR.  Elicia Lamp, PharmD, CPP  15 minutes spent face-to-face with the patient during the encounter. 50% of time spent on education, including signs/sx bleeding and clotting, as well as food and drug interactions with warfarin. 50% of time was spent on fingerprick POC INR sample collection,processing, results determination, and documentation in [email or web address].

## 2023-08-21 NOTE — Assessment & Plan Note (Signed)
 Remains on warfarin.  The swelling in his arm is chronic.  He is at goal INR so I dont think any additional workup is needed at  this time.

## 2023-08-21 NOTE — Assessment & Plan Note (Signed)
 Last lipid panel excellent. Continue atorvastatin 10mg  daily

## 2023-08-21 NOTE — Progress Notes (Signed)
 Established Patient Office Visit  Subjective   Patient ID: Alexander English, male    DOB: [birth year]  Age: 68 y.o. MRN: [id number]  Chief Complaint  Patient presents with   Medical Management of Chronic Issues    Left arm swollen. Back/waist pain - #2.   Medication Refill   Alexander English is here to follow-up hypertension, hemiparesis secondary to remote CVA.  At his last visit he was having some right arm weakness he went to 2 episodes of physical therapy and noted symptoms have completely resolved.  He has some chronic swelling in his left arm ever since the stroke he does not think it is any worse than usual.  He does have some chronic back pain but rates it as a 2 out of 10 today.  Overall he feels like he is doing well.  He plans to go to [place] in July he has not been in about 5 years he will schedule visit to check in with me before he goes.     Objective:     BP 128/80 (BP Location: Right Arm, Patient Position: Sitting, Cuff Size: Large)   Pulse 67   Temp 97.7 F (36.5 C) (Oral)   Ht 6' (1.829 m)   Wt 236 lb 9.6 oz (107.3 kg)   SpO2 100% Comment: RA  BMI 32.09 kg/m  BP Readings from Last 3 Encounters:  08/21/23 128/80  08/21/23 128/80  07/26/23 111/70   Wt Readings from Last 3 Encounters:  08/21/23 236 lb 9.6 oz (107.3 kg)  08/21/23 236 lb 9.6 oz (107.3 kg)  07/26/23 239 lb 9.6 oz (108.7 kg)      Physical Exam Constitutional:      Appearance: Normal appearance.  Pulmonary:     Effort: Pulmonary effort is normal.  Musculoskeletal:     Comments: Mild swelling of left hand, lower arm. No warmth or erythema.    Neurological:     Mental Status: He is alert.  Psychiatric:        Mood and Affect: Mood normal.        Behavior: Behavior normal.      Results for orders placed or performed in visit on 08/21/23  POCT INR  Result Value Ref Range   INR 2.1 2.0 - 3.0   POC INR        The ASCVD Risk score (Arnett DK, et al., 2019) failed to calculate for the  following reasons:   Risk score cannot be calculated because patient has a medical history suggesting prior/existing ASCVD    Assessment & Plan:   Problem List Items Addressed This Visit       Cardiovascular and Mediastinum   Aortic atherosclerosis (HCC) (Chronic)   Stable, continue atorvastatin 10mg  daily      Relevant Medications   atorvastatin (LIPITOR) 10 MG tablet   propranolol ER (INDERAL LA) 60 MG 24 hr capsule     Respiratory   Emphysema of lung (HCC)     Nervous and Auditory   Spastic hemiplegia affecting nondominant side (HCC) (Chronic)   Stable, no change, continue to use leg brace, has not needed baclofen.        Other   Urge incontinence of urine (Chronic)   Requesting refills of myrbetriq and toviaz      Relevant Medications   mirabegron ER (MYRBETRIQ) 50 MG TB24 tablet   fesoterodine (TOVIAZ) 8 MG TB24 tablet   Tubular adenoma (Chronic)   I believe he is due to repeat colonoscopy (5  year interval) [facility]      Hyperlipidemia (Chronic)   Last lipid panel excellent. Continue atorvastatin 10mg  daily      Relevant Medications   atorvastatin (LIPITOR) 10 MG tablet   propranolol ER (INDERAL LA) 60 MG 24 hr capsule   History of venous thromboembolism (Chronic)   Remains on warfarin.  The swelling in his arm is chronic.  He is at goal INR so I dont think any additional workup is needed at  this time.      Action tremor   Relevant Medications   propranolol ER (INDERAL LA) 60 MG 24 hr capsule   Other Visit Diagnoses       Colon cancer screening    -  Primary   Relevant Orders   Ambulatory referral to Gastroenterology       Return in about 4 months (around 12/19/2023).    Gust Rung, DO

## 2023-08-21 NOTE — Assessment & Plan Note (Signed)
 Stable, no change, continue to use leg brace, has not needed baclofen.

## 2023-08-21 NOTE — Assessment & Plan Note (Signed)
 I believe he is due to repeat colonoscopy (5 year interval) will refer him back to [facility]

## 2023-08-21 NOTE — Patient Instructions (Signed)
 Health Maintenance, Male  Adopting a healthy lifestyle and getting preventive care are important in promoting health and wellness. Ask your health care provider about:  The right schedule for you to have regular tests and exams.  Things you can do on your own to prevent diseases and keep yourself healthy.  What should I know about diet, weight, and exercise?  Eat a healthy diet    Eat a diet that includes plenty of vegetables, fruits, low-fat dairy products, and lean protein.  Do not eat a lot of foods that are high in solid fats, added sugars, or sodium.  Maintain a healthy weight  Body mass index (BMI) is a measurement that can be used to identify possible weight problems. It estimates body fat based on height and weight. Your health care provider can help determine your BMI and help you achieve or maintain a healthy weight.  Get regular exercise  Get regular exercise. This is one of the most important things you can do for your health. Most adults should:  Exercise for at least 150 minutes each week. The exercise should increase your heart rate and make you sweat (moderate-intensity exercise).  Do strengthening exercises at least twice a week. This is in addition to the moderate-intensity exercise.  Spend less time sitting. Even light physical activity can be beneficial.  Watch cholesterol and blood lipids  Have your blood tested for lipids and cholesterol at 69 years of age, then have this test every 5 years.  You may need to have your cholesterol levels checked more often if:  Your lipid or cholesterol levels are high.  You are older than 69 years of age.  You are at high risk for heart disease.  What should I know about cancer screening?  Many types of cancers can be detected early and may often be prevented. Depending on your health history and family history, you may need to have cancer screening at various ages. This may include screening for:  Colorectal cancer.  Prostate cancer.  Skin cancer.  Lung  cancer.  What should I know about heart disease, diabetes, and high blood pressure?  Blood pressure and heart disease  High blood pressure causes heart disease and increases the risk of stroke. This is more likely to develop in people who have high blood pressure readings or are overweight.  Talk with your health care provider about your target blood pressure readings.  Have your blood pressure checked:  Every 3-5 years if you are 9-95 years of age.  Every year if you are 85 years old or older.  If you are between the ages of 29 and 29 and are a current or former smoker, ask your health care provider if you should have a one-time screening for abdominal aortic aneurysm (AAA).  Diabetes  Have regular diabetes screenings. This checks your fasting blood sugar level. Have the screening done:  Once every three years after age 23 if you are at a normal weight and have a low risk for diabetes.  More often and at a younger age if you are overweight or have a high risk for diabetes.  What should I know about preventing infection?  Hepatitis B  If you have a higher risk for hepatitis B, you should be screened for this virus. Talk with your health care provider to find out if you are at risk for hepatitis B infection.  Hepatitis C  Blood testing is recommended for:  Everyone born from [birth year] through 1966.  Anyone  with known risk factors for hepatitis C.  Sexually transmitted infections (STIs)  You should be screened each year for STIs, including gonorrhea and chlamydia, if:  You are sexually active and are younger than 69 years of age.  You are older than 69 years of age and your health care provider tells you that you are at risk for this type of infection.  Your sexual activity has changed since you were last screened, and you are at increased risk for chlamydia or gonorrhea. Ask your health care provider if you are at risk.  Ask your health care provider about whether you are at high risk for HIV. Your health care provider  may recommend a prescription medicine to help prevent HIV infection. If you choose to take medicine to prevent HIV, you should first get tested for HIV. You should then be tested every 3 months for as long as you are taking the medicine.  Follow these instructions at home:  Alcohol use  Do not drink alcohol if your health care provider tells you not to drink.  If you drink alcohol:  Limit how much you have to 0-2 drinks a day.  Know how much alcohol is in your drink. In the [place] one drink equals one 12 oz bottle of beer (355 mL), one 5 oz glass of wine (148 mL), or one 1 oz glass of hard liquor (44 mL).  Lifestyle  Do not use any products that contain nicotine or tobacco. These products include cigarettes, chewing tobacco, and vaping devices, such as e-cigarettes. If you need help quitting, ask your health care provider.  Do not use street drugs.  Do not share needles.  Ask your health care provider for help if you need support or information about quitting drugs.  General instructions  Schedule regular health, dental, and eye exams.  Stay current with your vaccines.  Tell your health care provider if:  You often feel depressed.  You have ever been abused or do not feel safe at home.  Summary  Adopting a healthy lifestyle and getting preventive care are important in promoting health and wellness.  Follow your health care provider's instructions about healthy diet, exercising, and getting tested or screened for diseases.  Follow your health care provider's instructions on monitoring your cholesterol and blood pressure.  This information is not intended to replace advice given to you by your health care provider. Make sure you discuss any questions you have with your health care provider.  Document Revised: 11/02/2020 Document Reviewed: 11/02/2020  Elsevier Patient Education  2024 [employer].

## 2023-08-21 NOTE — Assessment & Plan Note (Signed)
 Stable, continue atorvastatin 10 mg daily.

## 2023-08-25 ENCOUNTER — Telehealth: Payer: Self-pay

## 2023-08-25 NOTE — Telephone Encounter (Signed)
 We received a request from your doctor for a  prior authorization on ERYTHROMYCIN-BENZOYL  GEL. Based on the information received, we are  unable to approve the request to have this drug  covered under your Part D benefit. The drug you asked for is not listed in your  preferred drug list (formulary). The preferred  drug(s), you may not have tried, are:  clindamycin phosphate topical swab  tazarotene 0.1 % topical cream  tretinoin topical gel

## 2023-08-25 NOTE — Telephone Encounter (Signed)
 Prior Authorization for patient (Benzoyl Peroxide-Erythromycin 5-3% gel) came through on cover my meds was submitted with last office notes awaiting approval or denial.   ZOX:[id number]

## 2023-08-27 NOTE — Progress Notes (Signed)
 Internal Medicine Attending:  I reviewed the AWV findings of the medical professional who conducted the visit. I was present in the office suite and immediately available to provide assistance and direction throughout the time the service was provided.

## 2023-08-30 MED ORDER — TAZAROTENE 0.1 % EX CREA: TOPICAL_CREAM | Freq: Every day | CUTANEOUS | 0 refills | Status: AC

## 2023-08-30 NOTE — Addendum Note (Signed)
 Addended by: Carlynn Purl C on: 08/30/2023 10:54 AM   Modules accepted: Orders

## 2023-08-30 NOTE — Telephone Encounter (Signed)
 Benzamycin not covered, will try change to tazarotene 0.1 % topical cream.  Apply to acne area daily at bedtime.

## 2023-09-06 DIAGNOSIS — Z86718 Personal history of other venous thrombosis and embolism: Secondary | ICD-10-CM | POA: Diagnosis not present

## 2023-09-06 DIAGNOSIS — Z860100 Personal history of colon polyps, unspecified: Secondary | ICD-10-CM | POA: Diagnosis not present

## 2023-09-06 DIAGNOSIS — Z9229 Personal history of other drug therapy: Secondary | ICD-10-CM | POA: Diagnosis not present

## 2023-09-06 DIAGNOSIS — I700 Atherosclerosis of aorta: Secondary | ICD-10-CM | POA: Diagnosis not present

## 2023-09-06 DIAGNOSIS — Z8673 Personal history of transient ischemic attack (TIA), and cerebral infarction without residual deficits: Secondary | ICD-10-CM | POA: Diagnosis not present

## 2023-09-14 DIAGNOSIS — Z125 Encounter for screening for malignant neoplasm of prostate: Secondary | ICD-10-CM | POA: Diagnosis not present

## 2023-09-18 ENCOUNTER — Ambulatory Visit

## 2023-09-21 DIAGNOSIS — R3915 Urgency of urination: Secondary | ICD-10-CM | POA: Diagnosis not present

## 2023-09-21 DIAGNOSIS — N401 Enlarged prostate with lower urinary tract symptoms: Secondary | ICD-10-CM | POA: Diagnosis not present

## 2023-09-21 DIAGNOSIS — N5201 Erectile dysfunction due to arterial insufficiency: Secondary | ICD-10-CM | POA: Diagnosis not present

## 2023-09-25 ENCOUNTER — Ambulatory Visit: Admitting: Pharmacist

## 2023-09-25 DIAGNOSIS — Z86711 Personal history of pulmonary embolism: Secondary | ICD-10-CM

## 2023-09-25 DIAGNOSIS — Z7901 Long term (current) use of anticoagulants: Secondary | ICD-10-CM

## 2023-09-25 DIAGNOSIS — I639 Cerebral infarction, unspecified: Secondary | ICD-10-CM

## 2023-09-25 DIAGNOSIS — Z86718 Personal history of other venous thrombosis and embolism: Secondary | ICD-10-CM

## 2023-09-25 LAB — POCT INR: INR: 2.9 (ref 2.0–3.0)

## 2023-09-25 MED ORDER — WARFARIN SODIUM 5 MG PO TABS: ORAL_TABLET | ORAL | 1 refills | Status: DC

## 2023-09-25 NOTE — Progress Notes (Signed)
 Anticoagulation Management Alexander English is a 68 y.o. male who reports to the clinic for monitoring of warfarin treatment.    Indication: CVA , History of; PE, History of; Long term current use of oral anticoagulant, warfarin with target INR range 2.0 - 3.0.  Duration: indefinite Supervising physician:  Dickie La, MD  Anticoagulation Clinic Visit History: Patient does not report signs/symptoms of bleeding or thromboembolism  Other recent changes: No diet, medications, lifestyle changes except as noted in patient findings.  Anticoagulation Episode Summary     Current INR goal:  2.0-3.0  TTR:  81.3% (11.5 y)  Next INR check:  10/23/2023  INR from last check:  2.9 (09/25/2023)  Weekly max warfarin dose:  --  Target end date:  Indefinite  INR check location:  Anticoagulation Clinic  Preferred lab:  --  Send INR reminders to:  [facility]   Indications   History of venous thromboembolism [Z86.718]        Comments:  --         No Known Allergies  Current Outpatient Medications:    aspirin EC 81 MG tablet, Take 81 mg by mouth daily., Disp: , Rfl:    atorvastatin (LIPITOR) 10 MG tablet, Take 1 tablet (10 mg total) by mouth daily., Disp: 90 tablet, Rfl: 3   Baclofen 5 MG TABS, Take 1 tablet (5 mg total) by mouth 3 (three) times daily as needed (spasticity)., Disp: 90 tablet, Rfl: 2   diclofenac Sodium (VOLTAREN ARTHRITIS PAIN) 1 % GEL, Apply 2 g topically 4 (four) times daily., Disp: 50 g, Rfl: 0   fesoterodine (TOVIAZ) 8 MG TB24 tablet, Take 1 tablet (8 mg total) by mouth daily., Disp: 90 tablet, Rfl: 3   mirabegron ER (MYRBETRIQ) 50 MG TB24 tablet, Take 1 tablet (50 mg total) by mouth daily., Disp: 90 tablet, Rfl: 3   Multiple Vitamin (MULTIVITAMIN) tablet, Take 1 tablet by mouth daily., Disp: , Rfl:    propranolol ER (INDERAL LA) 60 MG 24 hr capsule, Take 1 capsule (60 mg total) by mouth daily., Disp: 90 capsule, Rfl: 3   tamsulosin (FLOMAX) 0.4 MG CAPS capsule, Take 0.4 mg  by mouth daily., Disp: , Rfl:    tazarotene (AVAGE) 0.1 % cream, Apply topically at bedtime. To any area of acne., Disp: 30 g, Rfl: 0   traMADol (ULTRAM) 50 MG tablet, Take 50 mg by mouth every 6 (six) hours as needed., Disp: , Rfl:    zolpidem (AMBIEN) 5 MG tablet, Take 1 tablet (5 mg total) by mouth at bedtime as needed for sleep., Disp: 30 tablet, Rfl: 2   benzoyl peroxide-erythromycin (BENZAMYCIN) gel, Apply to affected area 2 times daily (Patient not taking: Reported on 09/25/2023), Disp: 47 g, Rfl: 1   ciclopirox (PENLAC) 8 % solution, Apply topically at bedtime. Apply over nail and surrounding skin. Apply daily over previous coat. After seven (7) days, may remove with alcohol and continue cycle. (Patient not taking: Reported on 09/25/2023), Disp: 6.6 mL, Rfl: 0   warfarin (COUMADIN) 5 MG tablet, Take one-half (1/2) tablet on Mondays, Wednesdays and Fridays. All other days--Sundays, Tuesdays, Thursdays and Saturdays, take one (1) tablet., Disp: 72 tablet, Rfl: 1 Past Medical History:  Diagnosis Date   Aortic atherosclerosis (HCC) 10/23/2017   Asymptomatic, seen on CT scan   Bilateral cataracts 05/18/2016   Not visually significant   Cancer (HCC)    maxillary   Cerebrovascular accident (CVA) (HCC) 03/27/2020   Chronic constipation 06/12/2013   Chronic low  back pain 06/12/2013   L4-5 facet arthropathy    Chronic pain of both shoulders 12/07/2013   A/C joint osteoarthritis    Cluster headaches    Resolved in 2006   COPD (chronic obstructive pulmonary disease) (HCC)    Dry eye syndrome, bilateral 05/18/2016   Embolic stroke involving right middle cerebral artery (HCC) 08/08/2004   Occured after traveling from [place] to [place] where he was hospitalized for 1 month with no further work-up or therapy.  Returned to [place] unable to walk and was admitted to [facility] immediately after landing. Presumed embolic per neuro but TEE, bubble study, and hypercoag W/U negative. On indefinite coumadin per patient's  informed preference.  Residual effects : Left spastic hemiplegia. Tx with phenol tibi   Generalized anxiety disorder 08/03/2018   History of kidney stones    Hyperplastic colon polyp 07/27/2012   Excised endoscopically 07/27/2012   Hypertensive retinopathy of both eyes 05/18/2016   Internal and external hemorrhoids without complication 12/19/2017   Seen on colonoscopy 04/03/2007.   Left nephrolithiasis 08/31/2015   With mild left hydronephrosis.  Scheduled to undergo shockwave lithotripsy.   Obesity (BMI 30.0-34.9) 12/07/2013   Osteoarthritis of both knees 01/26/2012   Positive PPD 12/07/2013   Pulmonary embolism (HCC) 09/30/2004   Diagnosed in April 2006 after returning from [place].  Likely provoked as it occurred after a 12 hour plane flight within 2 months of R MCA CVA with left hemiparesis. Patient has made an informed decision to remain on lifelong warfarin.    Tubular adenoma 12/19/2017   Endoscopically excised 04/03/2007.   Urge incontinence of urine 08/31/2015   Possibly secondary to prior CVA.  Treated with Myrbetriq.   Social History   Socioeconomic History   Marital status: Single    Spouse name: Not on file   Number of children: 3   Years of education: 16   Highest education level: Not on file  Occupational History   Occupation: Retired  Tobacco Use   Smoking status: Former    Current packs/day: 0.00    Types: Cigarettes    Quit date: 06/28/2007    Years since quitting: 16.2   Smokeless tobacco: Never  Vaping Use   Vaping status: Never Used  Substance and Sexual Activity   Alcohol use: Yes    Comment: Rarely.   Drug use: No   Sexual activity: Not on file  Other Topics Concern   Not on file  Social History Narrative   Born in [place] but has been in the [place] for > 30 years.  Divorced, 1 daughter and 2 sons.  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   Right handed   Coffee/tea sometimes, soda rarely   Social Drivers  of Health   Financial Resource Strain: Low Risk  (08/21/2023)   Overall Financial Resource Strain (CARDIA)    Difficulty of Paying Living Expenses: Not hard at all  Food Insecurity: No Food Insecurity (08/21/2023)   Hunger Vital Sign    Worried About Running Out of Food in the Last Year: Never true    Ran Out of Food in the Last Year: Never true  Transportation Needs: No Transportation Needs (08/21/2023)   PRAPARE - Administrator, Civil Service (Medical): No    Lack of Transportation (Non-Medical): No  Physical Activity: Inactive (01/17/2019)   Exercise Vital Sign    Days of Exercise per Week: 0 days    Minutes of Exercise per  Session: 0 min  Stress: No Stress Concern Present (08/21/2023)   [employer] of Occupational Health - Occupational Stress Questionnaire    Feeling of Stress : Not at all  Social Connections: Moderately Isolated (08/21/2023)   Social Connection and Isolation Panel [NHANES]    Frequency of Communication with Friends and Family: More than three times a week    Frequency of Social Gatherings with Friends and Family: More than three times a week    Attends Religious Services: More than 4 times per year    Active Member of [employer] or Organizations: No    Attends Engineer, structural: Never    Marital Status: Divorced   Family History  Problem Relation Age of Onset   Stroke Maternal Grandmother    Unexplained death Mother    Depression Mother        After husband's death   Unexplained death Father    Osteoarthritis Father        Knees   Early death Sister    Seizures Sister    Early death Brother 38       Unknown cause   Healthy Daughter    Healthy Son    Stroke Maternal Aunt    Healthy Sister    Healthy Sister    Unexplained death Brother    Healthy Brother    Healthy Son     ASSESSMENT Recent Results: The most recent result is correlated with 30 mg per week: Lab Results  Component Value Date   INR 2.9 09/25/2023   INR 2.1  08/21/2023   INR 3.6 (A) 07/31/2023    Anticoagulation Dosing: Description   Take only one-half (1/2) tablet on Mondays, Wednesdays and Fridays. All other days, take one (1) tablet.      INR today: Therapeutic  PLAN Weekly dose was decreased by 8% to 27.5 mg per week  Patient Instructions  Patient instructed to take medications as defined in the Anti-coagulation Track section of this encounter.  Patient instructed to take today's dose.  Patient instructed to take only one-half (1/2) tablet on Mondays, Wednesdays and Fridays. All other days, take one (1) tablet.  Patient verbalized understanding of these instructions.  Patient advised to contact clinic or seek medical attention if signs/symptoms of bleeding or thromboembolism occur.  Patient verbalized understanding by repeating back information and was advised to contact me if further medication-related questions arise. Patient was also provided an information handout.  Follow-up Return in 4 weeks (on 10/23/2023) for Follow up INR.  Elicia Lamp, PharmD, CPP  15 minutes spent face-to-face with the patient during the encounter. 50% of time spent on education, including signs/sx bleeding and clotting, as well as food and drug interactions with warfarin. 50% of time was spent on fingerprick POC INR sample collection,processing, results determination, and documentation in [email or web address].

## 2023-09-25 NOTE — Patient Instructions (Signed)
 Patient instructed to take medications as defined in the Anti-coagulation Track section of this encounter.  Patient instructed to take today's dose.  Patient instructed to take only one-half (1/2) tablet on Mondays, Wednesdays and Fridays. All other days, take one (1) tablet.  Patient verbalized understanding of these instructions.

## 2023-10-11 DIAGNOSIS — K635 Polyp of colon: Secondary | ICD-10-CM | POA: Diagnosis not present

## 2023-10-11 DIAGNOSIS — Z860100 Personal history of colon polyps, unspecified: Secondary | ICD-10-CM | POA: Diagnosis not present

## 2023-10-11 DIAGNOSIS — Z09 Encounter for follow-up examination after completed treatment for conditions other than malignant neoplasm: Secondary | ICD-10-CM | POA: Diagnosis not present

## 2023-10-11 LAB — HM COLONOSCOPY

## 2023-10-13 ENCOUNTER — Other Ambulatory Visit: Payer: Self-pay | Admitting: Internal Medicine

## 2023-10-13 NOTE — Telephone Encounter (Signed)
 Copied from CRM [id number]. Topic: Clinical - Medication Refill >> Oct 13, 2023  7:39 AM Madelyne Schiff wrote: Most Recent Primary Care Visit:  Provider: Sophia Dustman B  Department: [facility]  Visit Type: OPEN ESTABLISHED  Date: 09/25/2023  Medication: zolpidem  (AMBIEN ) 5 MG tablet  Has the patient contacted their pharmacy? No (Agent: If no, request that the patient contact the pharmacy for the refill. If patient does not wish to contact the pharmacy document the reason why and proceed with request.) (Agent: If yes, when and what did the pharmacy advise?)  Is this the correct pharmacy for this prescription? Yes If no, delete pharmacy and type the correct one.  This is the patient's preferred pharmacy:  [facility] [address] ([address] [place] - [address] [place] ([place]) [place] [address] Phone: [phone] Fax: [phone]    Is the patient out of the medication? No   (2 left)  Has the patient been seen for an appointment in the last year OR does the patient have an upcoming appointment? Yes  Can we respond through MyChart? Yes  Agent: Please be advised that Rx refills may take up to 3 business days. We ask that you follow-up with your pharmacy.

## 2023-10-16 DIAGNOSIS — K635 Polyp of colon: Secondary | ICD-10-CM | POA: Diagnosis not present

## 2023-10-17 MED ORDER — ZOLPIDEM TARTRATE 5 MG PO TABS: 5.0000 mg | ORAL_TABLET | Freq: Every evening | ORAL | 2 refills | Status: AC | PRN

## 2023-10-23 ENCOUNTER — Ambulatory Visit (INDEPENDENT_AMBULATORY_CARE_PROVIDER_SITE_OTHER): Admitting: Pharmacist

## 2023-10-23 DIAGNOSIS — Z86718 Personal history of other venous thrombosis and embolism: Secondary | ICD-10-CM

## 2023-10-23 DIAGNOSIS — Z86711 Personal history of pulmonary embolism: Secondary | ICD-10-CM | POA: Diagnosis not present

## 2023-10-23 DIAGNOSIS — I639 Cerebral infarction, unspecified: Secondary | ICD-10-CM

## 2023-10-23 DIAGNOSIS — Z7901 Long term (current) use of anticoagulants: Secondary | ICD-10-CM | POA: Diagnosis not present

## 2023-10-23 LAB — POCT INR: INR: 1.8 — AB (ref 2.0–3.0)

## 2023-10-23 NOTE — Progress Notes (Signed)
 Anticoagulation Management Alexander English is a 68 y.o. male who reports to the clinic for monitoring of warfarin treatment.    Indication:  History of DVT & PE; History of CVA; Long term anticoagulation with warfarin for target INR range 2.0 - 3.0.   Duration: indefinite Supervising physician:  Driscilla George, MD  Anticoagulation Clinic Visit History: Patient does not report signs/symptoms of bleeding or thromboembolism  Other recent changes: NO diet, medications, lifestyle changes identified or stated by the patient .  Anticoagulation Episode Summary     Current INR goal:  2.0-3.0  TTR:  81.3% (11.6 y)  Next INR check:  11/20/2023  INR from last check:  1.8 (10/23/2023)  Weekly max warfarin dose:  --  Target end date:  Indefinite  INR check location:  Anticoagulation Clinic  Preferred lab:  --  Send INR reminders to:  [facility]   Indications   History of venous thromboembolism [Z86.718]        Comments:  --         No Known Allergies  Current Outpatient Medications:    aspirin  EC 81 MG tablet, Take 81 mg by mouth daily., Disp: , Rfl:    atorvastatin  (LIPITOR) 10 MG tablet, Take 1 tablet (10 mg total) by mouth daily., Disp: 90 tablet, Rfl: 3   Baclofen  5 MG TABS, Take 1 tablet (5 mg total) by mouth 3 (three) times daily as needed (spasticity)., Disp: 90 tablet, Rfl: 2   benzoyl peroxide -erythromycin  (BENZAMYCIN) gel, Apply to affected area 2 times daily (Patient not taking: Reported on 09/25/2023), Disp: 47 g, Rfl: 1   ciclopirox  (PENLAC ) 8 % solution, Apply topically at bedtime. Apply over nail and surrounding skin. Apply daily over previous coat. After seven (7) days, may remove with alcohol and continue cycle. (Patient not taking: Reported on 09/25/2023), Disp: 6.6 mL, Rfl: 0   diclofenac  Sodium (VOLTAREN  ARTHRITIS PAIN) 1 % GEL, Apply 2 g topically 4 (four) times daily., Disp: 50 g, Rfl: 0   fesoterodine  (TOVIAZ ) 8 MG TB24 tablet, Take 1 tablet (8 mg total) by mouth  daily., Disp: 90 tablet, Rfl: 3   mirabegron  ER (MYRBETRIQ ) 50 MG TB24 tablet, Take 1 tablet (50 mg total) by mouth daily., Disp: 90 tablet, Rfl: 3   Multiple Vitamin (MULTIVITAMIN) tablet, Take 1 tablet by mouth daily., Disp: , Rfl:    propranolol  ER (INDERAL  LA) 60 MG 24 hr capsule, Take 1 capsule (60 mg total) by mouth daily., Disp: 90 capsule, Rfl: 3   tamsulosin (FLOMAX) 0.4 MG CAPS capsule, Take 0.4 mg by mouth daily., Disp: , Rfl:    tazarotene  (AVAGE ) 0.1 % cream, Apply topically at bedtime. To any area of acne., Disp: 30 g, Rfl: 0   traMADol  (ULTRAM ) 50 MG tablet, Take 50 mg by mouth every 6 (six) hours as needed., Disp: , Rfl:    warfarin (COUMADIN ) 5 MG tablet, Take one-half (1/2) tablet on Mondays, Wednesdays and Fridays. All other days--Sundays, Tuesdays, Thursdays and Saturdays, take one (1) tablet., Disp: 72 tablet, Rfl: 1   zolpidem  (AMBIEN ) 5 MG tablet, Take 1 tablet (5 mg total) by mouth at bedtime as needed for sleep., Disp: 30 tablet, Rfl: 2 Past Medical History:  Diagnosis Date   Aortic atherosclerosis (HCC) 10/23/2017   Asymptomatic, seen on CT scan   Bilateral cataracts 05/18/2016   Not visually significant   Cancer (HCC)    maxillary   Cerebrovascular accident (CVA) (HCC) 03/27/2020   Chronic constipation 06/12/2013   Chronic  low back pain 06/12/2013   L4-5 facet arthropathy    Chronic pain of both shoulders 12/07/2013   A/C joint osteoarthritis    Cluster headaches    Resolved in 2006   COPD (chronic obstructive pulmonary disease) (HCC)    Dry eye syndrome, bilateral 05/18/2016   Embolic stroke involving right middle cerebral artery (HCC) 08/08/2004   Occured after traveling from [place]  to [place] where he was hospitalized for 1 month with no further work-up or therapy.  [place] to [place] and was admitted to [facility] immediately after landing. Presumed embolic per neuro but TEE, bubble study, and hypercoag W/U negative. On indefinite coumadin  per patient's  informed preference.  Residual effects : Left spastic hemiplegia. Tx with phenol tibi   Generalized anxiety disorder 08/03/2018   History of kidney stones    Hyperplastic colon polyp 07/27/2012   Excised endoscopically 07/27/2012   Hypertensive retinopathy of both eyes 05/18/2016   Internal and external hemorrhoids without complication 12/19/2017   Seen on colonoscopy 04/03/2007.   Left nephrolithiasis 08/31/2015   With mild left hydronephrosis.  Scheduled to undergo shockwave lithotripsy.   Obesity (BMI 30.0-34.9) 12/07/2013   Osteoarthritis of both knees 01/26/2012   Positive PPD 12/07/2013   Pulmonary embolism (HCC) 09/30/2004   Diagnosed in April 2006 after returning from [place].  Likely provoked as it occurred after a 12 hour plane flight within 2 months of R MCA CVA with left hemiparesis. Patient has made an informed decision to remain on lifelong warfarin.    Tubular adenoma 12/19/2017   Endoscopically excised 04/03/2007.   Urge incontinence of urine 08/31/2015   Possibly secondary to prior CVA.  Treated with Myrbetriq .   Social History   Socioeconomic History   Marital status: Single    Spouse name: Not on file   Number of children: 3   Years of education: 16   Highest education level: Not on file  Occupational History   Occupation: Retired  Tobacco Use   Smoking status: Former    Current packs/day: 0.00    Types: Cigarettes    Quit date: 06/28/2007    Years since quitting: 16.3   Smokeless tobacco: Never  Vaping Use   Vaping status: Never Used  Substance and Sexual Activity   Alcohol use: Yes    Comment: Rarely.   Drug use: No   Sexual activity: Not on file  Other Topics Concern   Not on file  Social History Narrative   Born in [place] but has been in the [place]  for > [place] years.  Divorced, 1 daughter and 2 sons.  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   Right handed   Coffee/tea sometimes, soda rarely   Social Drivers  of Health   Financial Resource Strain: Low Risk  (08/21/2023)   Overall Financial Resource Strain (CARDIA)    Difficulty of Paying Living Expenses: Not hard at all  Food Insecurity: No Food Insecurity (08/21/2023)   Hunger Vital Sign    Worried About Running Out of Food in the Last Year: Never true    Ran Out of Food in the Last Year: Never true  Transportation Needs: No Transportation Needs (08/21/2023)   PRAPARE - Administrator, Civil Service (Medical): No    Lack of Transportation (Non-Medical): No  Physical Activity: Inactive (01/17/2019)   Exercise Vital Sign    Days of Exercise per Week: 0 days    Minutes of Exercise  per Session: 0 min  Stress: No Stress Concern Present (08/21/2023)   [employer] of Occupational Health - Occupational Stress Questionnaire    Feeling of Stress : Not at all  Social Connections: Moderately Isolated (08/21/2023)   Social Connection and Isolation Panel [NHANES]    Frequency of Communication with Friends and Family: More than three times a week    Frequency of Social Gatherings with Friends and Family: More than three times a week    Attends Religious Services: More than 4 times per year    Active Member of [employer] or Organizations: No    Attends Engineer, structural: Never    Marital Status: Divorced   Family History  Problem Relation Age of Onset   Stroke Maternal Grandmother    Unexplained death Mother    Depression Mother        After husband's death   Unexplained death Father    Osteoarthritis Father        Knees   Early death Sister    Seizures Sister    Early death Brother 77       Unknown cause   Healthy Daughter    Healthy Son    Stroke Maternal Aunt    Healthy Sister    Healthy Sister    Unexplained death Brother    Healthy Brother    Healthy Son     ASSESSMENT Recent Results: The most recent result is correlated with 27.5 mg per week: Lab Results  Component Value Date   INR 1.8 (A) 10/23/2023    INR 2.9 09/25/2023   INR 2.1 08/21/2023    Anticoagulation Dosing: Description   Take only one-half (1/2) tablet on Sundays and Fridays. All other days (Mondays, Tuesdays, Wednesdays, Thursdays and Saturdays, take one (1) tablet.      INR today: Subtherapeutic  PLAN Weekly dose was increased by 9% to 30 mg per week  Patient Instructions  Patient instructed to take medications as defined in the Anti-coagulation Track section of this encounter.  Patient instructed to take today's dose.  Patient instructed to take only one-half (1/2) tablet on Sundays and Fridays. All other days (Mondays, Tuesdays, Wednesdays, Thursdays and Saturdays, take one (1) tablet.  Patient verbalized understanding of these instructions.  Patient advised to contact clinic or seek medical attention if signs/symptoms of bleeding or thromboembolism occur.  Patient verbalized understanding by repeating back information and was advised to contact me if further medication-related questions arise. Patient was also provided an information handout.  Follow-up Return in about 3 weeks (around 11/13/2023) for Follow up INR.  Kenda Paula, PharmD, CPP Clinical Pharmacist Practitioner  15 minutes spent face-to-face with the patient during the encounter. 50% of time spent on education, including signs/sx bleeding and clotting, as well as food and drug interactions with warfarin. 50% of time was spent on fingerprick POC INR sample collection,processing, results determination, and documentation in [email or web address].

## 2023-10-23 NOTE — Patient Instructions (Signed)
 Patient instructed to take medications as defined in the Anti-coagulation Track section of this encounter.  Patient instructed to take today's dose.  Patient instructed to take only one-half (1/2) tablet on Sundays and Fridays. All other days (Mondays, Tuesdays, Wednesdays, Thursdays and Saturdays, take one (1) tablet.  Patient verbalized understanding of these instructions.

## 2023-10-24 DIAGNOSIS — H25813 Combined forms of age-related cataract, bilateral: Secondary | ICD-10-CM | POA: Diagnosis not present

## 2023-10-24 DIAGNOSIS — H35033 Hypertensive retinopathy, bilateral: Secondary | ICD-10-CM | POA: Diagnosis not present

## 2023-10-24 DIAGNOSIS — H40023 Open angle with borderline findings, high risk, bilateral: Secondary | ICD-10-CM | POA: Diagnosis not present

## 2023-10-24 DIAGNOSIS — H04123 Dry eye syndrome of bilateral lacrimal glands: Secondary | ICD-10-CM | POA: Diagnosis not present

## 2023-11-03 ENCOUNTER — Ambulatory Visit: Admitting: Podiatry

## 2023-11-13 ENCOUNTER — Ambulatory Visit

## 2023-11-27 ENCOUNTER — Telehealth: Payer: Self-pay | Admitting: *Deleted

## 2023-11-27 ENCOUNTER — Ambulatory Visit (INDEPENDENT_AMBULATORY_CARE_PROVIDER_SITE_OTHER): Admitting: Pharmacist

## 2023-11-27 DIAGNOSIS — Z7901 Long term (current) use of anticoagulants: Secondary | ICD-10-CM | POA: Diagnosis not present

## 2023-11-27 DIAGNOSIS — Z86718 Personal history of other venous thrombosis and embolism: Secondary | ICD-10-CM | POA: Diagnosis not present

## 2023-11-27 DIAGNOSIS — I639 Cerebral infarction, unspecified: Secondary | ICD-10-CM

## 2023-11-27 LAB — POCT INR: INR: 2.1 (ref 2.0–3.0)

## 2023-11-27 NOTE — Patient Instructions (Signed)
 Patient instructed to take medications as defined in the Anti-coagulation Track section of this encounter.  Patient instructed to take today's dose.  Patient instructed to take one (1) of your 5 mg strength, peach-colored warfarin tablets by mouth, once-daily.  Patient verbalized understanding of these instructions.

## 2023-11-27 NOTE — Progress Notes (Signed)
 Anticoagulation Management Alexander English is a 68 y.o. male who reports to the clinic for monitoring of warfarin treatment.    Indication: CVA and DVT, History of; Long term current use of oral anticoagulation with warfarin. Target INR range 2.0 -3.0.  Duration: indefinite Supervising physician: Vesta Gourd. Chambliss, MD  Anticoagulation Clinic Visit History: Patient does not report signs/symptoms of bleeding or thromboembolism  Other recent changes: No diet, medications, lifestyle changes cited or identified by the patient at this visit.  Anticoagulation Episode Summary     Current INR goal:  2.0-3.0  TTR:  80.9% (11.7 y)  Next INR check:  12/25/2023  INR from last check:  2.1 (11/27/2023)  Weekly max warfarin dose:  --  Target end date:  Indefinite  INR check location:  Anticoagulation Clinic  Preferred lab:  --  Send INR reminders to:  [facility]   Indications   History of venous thromboembolism [Z86.718]        Comments:  --         No Known Allergies  Current Outpatient Medications:    aspirin  EC 81 MG tablet, Take 81 mg by mouth daily., Disp: , Rfl:    atorvastatin  (LIPITOR) 10 MG tablet, Take 1 tablet (10 mg total) by mouth daily., Disp: 90 tablet, Rfl: 3   Baclofen  5 MG TABS, Take 1 tablet (5 mg total) by mouth 3 (three) times daily as needed (spasticity)., Disp: 90 tablet, Rfl: 2   diclofenac  Sodium (VOLTAREN  ARTHRITIS PAIN) 1 % GEL, Apply 2 g topically 4 (four) times daily., Disp: 50 g, Rfl: 0   fesoterodine  (TOVIAZ ) 8 MG TB24 tablet, Take 1 tablet (8 mg total) by mouth daily., Disp: 90 tablet, Rfl: 3   mirabegron  ER (MYRBETRIQ ) 50 MG TB24 tablet, Take 1 tablet (50 mg total) by mouth daily., Disp: 90 tablet, Rfl: 3   Multiple Vitamin (MULTIVITAMIN) tablet, Take 1 tablet by mouth daily., Disp: , Rfl:    propranolol  ER (INDERAL  LA) 60 MG 24 hr capsule, Take 1 capsule (60 mg total) by mouth daily., Disp: 90 capsule, Rfl: 3   tamsulosin (FLOMAX) 0.4 MG CAPS  capsule, Take 0.4 mg by mouth daily., Disp: , Rfl:    tazarotene  (AVAGE ) 0.1 % cream, Apply topically at bedtime. To any area of acne., Disp: 30 g, Rfl: 0   traMADol  (ULTRAM ) 50 MG tablet, Take 50 mg by mouth every 6 (six) hours as needed., Disp: , Rfl:    warfarin (COUMADIN ) 5 MG tablet, Take one-half (1/2) tablet on Mondays, Wednesdays and Fridays. All other days--Sundays, Tuesdays, Thursdays and Saturdays, take one (1) tablet., Disp: 72 tablet, Rfl: 1   zolpidem  (AMBIEN ) 5 MG tablet, Take 1 tablet (5 mg total) by mouth at bedtime as needed for sleep., Disp: 30 tablet, Rfl: 2   benzoyl peroxide -erythromycin  (BENZAMYCIN) gel, Apply to affected area 2 times daily (Patient not taking: Reported on 11/27/2023), Disp: 47 g, Rfl: 1   ciclopirox  (PENLAC ) 8 % solution, Apply topically at bedtime. Apply over nail and surrounding skin. Apply daily over previous coat. After seven (7) days, may remove with alcohol and continue cycle. (Patient not taking: Reported on 06/12/2023), Disp: 6.6 mL, Rfl: 0 Past Medical History:  Diagnosis Date   Aortic atherosclerosis (HCC) 10/23/2017   Asymptomatic, seen on CT scan   Bilateral cataracts 05/18/2016   Not visually significant   Cancer (HCC)    maxillary   Cerebrovascular accident (CVA) (HCC) 03/27/2020   Chronic constipation 06/12/2013   Chronic low  back pain 06/12/2013   L4-5 facet arthropathy    Chronic pain of both shoulders 12/07/2013   A/C joint osteoarthritis    Cluster headaches    Resolved in 2006   COPD (chronic obstructive pulmonary disease) (HCC)    Dry eye syndrome, bilateral 05/18/2016   Embolic stroke involving right middle cerebral artery (HCC) 08/08/2004   Occured after traveling from [place]  to [place] where he was hospitalized for 1 month with no further work-up or therapy.  [place] and was admitted to [facility] immediately after landing. Presumed embolic per neuro but TEE, bubble study, and hypercoag W/U negative. On indefinite  coumadin  per patient's informed preference.  Residual effects : Left spastic hemiplegia. Tx with phenol tibi   Generalized anxiety disorder 08/03/2018   History of kidney stones    Hyperplastic colon polyp 07/27/2012   Excised endoscopically 07/27/2012   Hypertensive retinopathy of both eyes 05/18/2016   Internal and external hemorrhoids without complication 12/19/2017   Seen on colonoscopy 04/03/2007.   Left nephrolithiasis 08/31/2015   With mild left hydronephrosis.  Scheduled to undergo shockwave lithotripsy.   Obesity (BMI 30.0-34.9) 12/07/2013   Osteoarthritis of both knees 01/26/2012   Positive PPD 12/07/2013   Pulmonary embolism (HCC) 09/30/2004   Diagnosed in April 2006 after returning from [place].  Likely provoked as it occurred after a 12 hour plane flight within 2 months of R MCA CVA with left hemiparesis. Patient has made an informed decision to remain on lifelong warfarin.    Tubular adenoma 12/19/2017   Endoscopically excised 04/03/2007.   Urge incontinence of urine 08/31/2015   Possibly secondary to prior CVA.  Treated with Myrbetriq .   Social History   Socioeconomic History   Marital status: Single    Spouse name: Not on file   Number of children: 3   Years of education: 16   Highest education level: Not on file  Occupational History   Occupation: Retired  Tobacco Use   Smoking status: Former    Current packs/day: 0.00    Types: Cigarettes    Quit date: 06/28/2007    Years since quitting: 16.4   Smokeless tobacco: Never  Vaping Use   Vaping status: Never Used  Substance and Sexual Activity   Alcohol use: Yes    Comment: Rarely.   Drug use: No   Sexual activity: Not on file  Other Topics Concern   Not on file  Social History Narrative   Born in [place] but has been in the [place]  for > 30 years.  Divorced, 1 daughter and 2 sons.  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   Right handed   Coffee/tea sometimes, soda  rarely   Social Drivers of Health   Financial Resource Strain: Low Risk  (08/21/2023)   Overall Financial Resource Strain (CARDIA)    Difficulty of Paying Living Expenses: Not hard at all  Food Insecurity: No Food Insecurity (08/21/2023)   Hunger Vital Sign    Worried About Running Out of Food in the Last Year: Never true    Ran Out of Food in the Last Year: Never true  Transportation Needs: No Transportation Needs (08/21/2023)   PRAPARE - Administrator, Civil Service (Medical): No    Lack of Transportation (Non-Medical): No  Physical Activity: Inactive (01/17/2019)   Exercise Vital Sign    Days of Exercise per Week: 0 days    Minutes of Exercise per  Session: 0 min  Stress: No Stress Concern Present (08/21/2023)   [employer] of Occupational Health - Occupational Stress Questionnaire    Feeling of Stress : Not at all  Social Connections: Moderately Isolated (08/21/2023)   Social Connection and Isolation Panel [NHANES]    Frequency of Communication with Friends and Family: More than three times a week    Frequency of Social Gatherings with Friends and Family: More than three times a week    Attends Religious Services: More than 4 times per year    Active Member of [employer] or Organizations: No    Attends Engineer, structural: Never    Marital Status: Divorced   Family History  Problem Relation Age of Onset   Stroke Maternal Grandmother    Unexplained death Mother    Depression Mother        After husband's death   Unexplained death Father    Osteoarthritis Father        Knees   Early death Sister    Seizures Sister    Early death Brother 64       Unknown cause   Healthy Daughter    Healthy Son    Stroke Maternal Aunt    Healthy Sister    Healthy Sister    Unexplained death Brother    Healthy Brother    Healthy Son     ASSESSMENT Recent Results: The most recent result is correlated with 30 mg per week: Lab Results  Component Value Date   INR  2.1 11/27/2023   INR 1.8 (A) 10/23/2023   INR 2.9 09/25/2023    Anticoagulation Dosing: Description   Take one (1) of your 5 mg strength, peach-colored warfarin tablets by mouth, once-daily.      INR today: Therapeutic  PLAN Weekly dose was increased by 16% to 35 mg per week  Patient Instructions  Patient instructed to take medications as defined in the Anti-coagulation Track section of this encounter.  Patient instructed to take today's dose.  Patient instructed to take one (1) of your 5 mg strength, peach-colored warfarin tablets by mouth, once-daily.  Patient verbalized understanding of these instructions.  Patient advised to contact clinic or seek medical attention if signs/symptoms of bleeding or thromboembolism occur.  Patient verbalized understanding by repeating back information and was advised to contact me if further medication-related questions arise. Patient was also provided an information handout.  Follow-up Return in about 4 weeks (around 12/25/2023) for Follow up INR.  Kenda Paula, PharmD, CPP Clinical Pharmacist Practitioner  15 minutes spent face-to-face with the patient during the encounter. 50% of time spent on education, including signs/sx bleeding and clotting, as well as food and drug interactions with warfarin. 50% of time was spent on fingerprick POC INR sample collection,processing, results determination, and documentation in [email or web address].

## 2023-11-27 NOTE — Telephone Encounter (Signed)
 Patient here for appointment with Dr. Margit Shelling.  C/O of shoulder for 2 weeks now.   Stated has been taking Extra Strength Tylenol .n  Given appointment for Thursday 11/29/2023.  Will call patient if an earlier appointment opens up.

## 2023-11-29 ENCOUNTER — Ambulatory Visit: Admitting: Podiatry

## 2023-11-29 DIAGNOSIS — L603 Nail dystrophy: Secondary | ICD-10-CM

## 2023-11-29 NOTE — Progress Notes (Signed)
 Subjective:  Patient ID: Alexander English, male    DOB: 1955/07/27,  MRN: [id number]  Chief Complaint  Patient presents with   Nail Problem    Discomfort with 1st and 2nd toe nail     68 y.o. male presents with the above complaint.  Patient presents with right hallux nail dystrophy.  Patient states pain for touch is progressive and worse is mild discomfort.  He does not want to have it removed.  He would just like to discuss slant back procedure.  He denies any other acute complaints.  He has a history of ingrown removed in the past   Review of Systems: Negative except as noted in the HPI. Denies N/V/F/Ch.  Past Medical History:  Diagnosis Date   Aortic atherosclerosis (HCC) 10/23/2017   Asymptomatic, seen on CT scan   Bilateral cataracts 05/18/2016   Not visually significant   Cancer (HCC)    maxillary   Cerebrovascular accident (CVA) (HCC) 03/27/2020   Chronic constipation 06/12/2013   Chronic low back pain 06/12/2013   L4-5 facet arthropathy    Chronic pain of both shoulders 12/07/2013   A/C joint osteoarthritis    Cluster headaches    Resolved in 2006   COPD (chronic obstructive pulmonary disease) (HCC)    Dry eye syndrome, bilateral 05/18/2016   Embolic stroke involving right middle cerebral artery (HCC) 08/08/2004   Occured after traveling from [place]  to [place] where he was hospitalized for 1 month with no further work-up or therapy.  [place] and was admitted to [facility] immediately after landing. Presumed embolic per neuro but TEE, bubble study, and hypercoag W/U negative. On indefinite coumadin  per patient's informed preference.  Residual effects : Left spastic hemiplegia. Tx with phenol tibi   Generalized anxiety disorder 08/03/2018   History of kidney stones    Hyperplastic colon polyp 07/27/2012   Excised endoscopically 07/27/2012   Hypertensive retinopathy of both eyes 05/18/2016   Internal and external hemorrhoids without complication 12/19/2017    Seen on colonoscopy 04/03/2007.   Left nephrolithiasis 08/31/2015   With mild left hydronephrosis.  Scheduled to undergo shockwave lithotripsy.   Obesity (BMI 30.0-34.9) 12/07/2013   Osteoarthritis of both knees 01/26/2012   Positive PPD 12/07/2013   Pulmonary embolism (HCC) 09/30/2004   Diagnosed in April 2006 after returning from [place].  Likely provoked as it occurred after a 12 hour plane flight within 2 months of R MCA CVA with left hemiparesis. Patient has made an informed decision to remain on lifelong warfarin.    Tubular adenoma 12/19/2017   Endoscopically excised 04/03/2007.   Urge incontinence of urine 08/31/2015   Possibly secondary to prior CVA.  Treated with Myrbetriq .    Current Outpatient Medications:    aspirin  EC 81 MG tablet, Take 81 mg by mouth daily., Disp: , Rfl:    atorvastatin  (LIPITOR) 10 MG tablet, Take 1 tablet (10 mg total) by mouth daily., Disp: 90 tablet, Rfl: 3   Baclofen  5 MG TABS, Take 1 tablet (5 mg total) by mouth 3 (three) times daily as needed (spasticity)., Disp: 90 tablet, Rfl: 2   benzoyl peroxide -erythromycin  (BENZAMYCIN) gel, Apply to affected area 2 times daily (Patient not taking: Reported on 11/27/2023), Disp: 47 g, Rfl: 1   ciclopirox  (PENLAC ) 8 % solution, Apply topically at bedtime. Apply over nail and surrounding skin. Apply daily over previous coat. After seven (7) days, may remove with alcohol and continue cycle. (Patient not taking: Reported on 06/12/2023), Disp: 6.6 mL,  Rfl: 0   diclofenac  Sodium (VOLTAREN  ARTHRITIS PAIN) 1 % GEL, Apply 2 g topically 4 (four) times daily., Disp: 50 g, Rfl: 0   fesoterodine  (TOVIAZ ) 8 MG TB24 tablet, Take 1 tablet (8 mg total) by mouth daily., Disp: 90 tablet, Rfl: 3   mirabegron  ER (MYRBETRIQ ) 50 MG TB24 tablet, Take 1 tablet (50 mg total) by mouth daily., Disp: 90 tablet, Rfl: 3   Multiple Vitamin (MULTIVITAMIN) tablet, Take 1 tablet by mouth daily., Disp: , Rfl:    propranolol  ER (INDERAL  LA) 60 MG 24 hr  capsule, Take 1 capsule (60 mg total) by mouth daily., Disp: 90 capsule, Rfl: 3   tamsulosin (FLOMAX) 0.4 MG CAPS capsule, Take 0.4 mg by mouth daily., Disp: , Rfl:    tazarotene  (AVAGE ) 0.1 % cream, Apply topically at bedtime. To any area of acne., Disp: 30 g, Rfl: 0   traMADol  (ULTRAM ) 50 MG tablet, Take 50 mg by mouth every 6 (six) hours as needed., Disp: , Rfl:    warfarin (COUMADIN ) 5 MG tablet, Take one-half (1/2) tablet on Mondays, Wednesdays and Fridays. All other days--Sundays, Tuesdays, Thursdays and Saturdays, take one (1) tablet., Disp: 72 tablet, Rfl: 1   zolpidem  (AMBIEN ) 5 MG tablet, Take 1 tablet (5 mg total) by mouth at bedtime as needed for sleep., Disp: 30 tablet, Rfl: 2  Social History   Tobacco Use  Smoking Status Former   Current packs/day: 0.00   Types: Cigarettes   Quit date: 06/28/2007   Years since quitting: 16.4  Smokeless Tobacco Never    No Known Allergies Objective:  There were no vitals filed for this visit. There is no height or weight on file to calculate BMI. Constitutional Well developed. Well nourished.  Vascular Dorsalis pedis pulses palpable bilaterally. Posterior tibial pulses palpable bilaterally. Capillary refill normal to all digits.  No cyanosis or clubbing noted. Pedal hair growth normal.  Neurologic Normal speech. Oriented to person, place, and time. Epicritic sensation to light touch grossly present bilaterally.  Dermatologic Thickened elongated dystrophic mycotic toenails right hallux with ingrown noted.  No signs of infection noted.  Orthopedic: Normal joint ROM without pain or crepitus bilaterally. No visible deformities. No bony tenderness.   Radiographs: None Assessment:   1. Nail dystrophy     Plan:  Patient was evaluated and treated and all questions answered.  Right hallux nail dystrophy -All questions or concerns were discussed with the patient in extensive detail given the amount of ingrown left present I believe  patient will benefit from slant back procedure to give him some temporary relief.  He will think about having the nail removed in the future.  Slant back procedure was performed in standard technique with nail nipper.  No complication noted - Patient is on Coumadin  as well.  Would likely need to stop Coumadin  prior to undergoing this procedure.  Patient will reach out to her primary care physician  No follow-ups on file.

## 2023-11-30 ENCOUNTER — Encounter: Admitting: Student

## 2023-12-06 ENCOUNTER — Ambulatory Visit: Admitting: Student

## 2023-12-06 ENCOUNTER — Encounter: Payer: Self-pay | Admitting: Student

## 2023-12-06 VITALS — BP 133/74 | HR 67 | Temp 97.5°F | Ht 72.0 in | Wt 228.2 lb

## 2023-12-06 DIAGNOSIS — M19019 Primary osteoarthritis, unspecified shoulder: Secondary | ICD-10-CM | POA: Diagnosis not present

## 2023-12-06 DIAGNOSIS — R7303 Prediabetes: Secondary | ICD-10-CM

## 2023-12-06 DIAGNOSIS — G8929 Other chronic pain: Secondary | ICD-10-CM | POA: Diagnosis not present

## 2023-12-06 DIAGNOSIS — M25511 Pain in right shoulder: Secondary | ICD-10-CM | POA: Diagnosis not present

## 2023-12-06 DIAGNOSIS — Z86718 Personal history of other venous thrombosis and embolism: Secondary | ICD-10-CM

## 2023-12-06 DIAGNOSIS — M25512 Pain in left shoulder: Secondary | ICD-10-CM | POA: Diagnosis not present

## 2023-12-06 NOTE — Patient Instructions (Signed)
 Thank you, Mr.Sparrow O Carnathan for allowing us  to provide your care today. Today we discussed your right shoulder pain.    As we discussed, things are most likely due to joint osteoarthritis.  You are putting a lot of strain on to those joints with your cane.  I think your numbness and tingling in the 3 fingers is related to possible nerve compression at your elbow.  You may use Voltaren  gel on both your elbow and your shoulder up to 4 times daily.  You can have someone help you apply this as needed.  You can continue taking Tylenol .  I have placed a referral to the sports medicine doctors, who will contact you to schedule an appointment.  Referrals ordered today:    Referral Orders         Ambulatory referral to Sports Medicine      Follow up: 3 months   We look forward to seeing you next time. Please call our clinic at [phone] if you have any questions or concerns. The best time to call is Monday-Friday from 9am-4pm, but there is someone available 24/7. If after hours or the weekend, call the main hospital number and ask for the Internal Medicine Resident On-Call. If you need medication refills, please notify your pharmacy one week in advance and they will send us  a request.   Thank you for trusting me with your care. Wishing you the best!   Dorthy Gavia, MD [facility]

## 2023-12-06 NOTE — Assessment & Plan Note (Signed)
 A1c 3 months ago was 5.8%.  Will space out checking this and we will check it at his next visit, which will be at a 60-month interval.  He is not currently taking any medications for this.  Continue with lifestyle management.

## 2023-12-06 NOTE — Assessment & Plan Note (Deleted)
 Who presents to the clinic with concerns for right shoulder pain.  He says the pain is about a 4 out of 10.  It is chronic pain, but is been bothering him more recently.  On previous imaging back in 2009, [facility] joint osteoarthritis was noted in both shoulders, which is likely progressed since then.  He was referred for physical therapy at his last visit, but has not gone yet.

## 2023-12-06 NOTE — Progress Notes (Signed)
 CC: right shoulder pain  HPI:  Alexander English is a 68 y.o. male living with a history stated below and presents today for the chief complaint stated above. Please see problem based assessment and plan for additional details.  Past Medical History:  Diagnosis Date   Aortic atherosclerosis (HCC) 10/23/2017   Asymptomatic, seen on CT scan   Bilateral cataracts 05/18/2016   Not visually significant   Cancer (HCC)    maxillary   Cerebrovascular accident (CVA) (HCC) 03/27/2020   Chronic constipation 06/12/2013   Chronic low back pain 06/12/2013   L4-5 facet arthropathy    Chronic pain of both shoulders 12/07/2013   A/C joint osteoarthritis    Cluster headaches    Resolved in 2006   COPD (chronic obstructive pulmonary disease) (HCC)    Dry eye syndrome, bilateral 05/18/2016   Embolic stroke involving right middle cerebral artery (HCC) 08/08/2004   Occured after traveling from [place]  to [place] where he was hospitalized for 1 month with no further work-up or therapy.  [place]  un[place] walk and was admitted to [facility] immediately after landing. Presumed embolic per neuro but TEE, bubble study, and hypercoag W/U negative. On indefinite coumadin  per patient's informed preference.  Residual effects : Left spastic hemiplegia. Tx with phenol tibi   Generalized anxiety disorder 08/03/2018   History of kidney stones    Hyperplastic colon polyp 07/27/2012   Excised endoscopically 07/27/2012   Hypertensive retinopathy of both eyes 05/18/2016   Internal and external hemorrhoids without complication 12/19/2017   Seen on colonoscopy 04/03/2007.   Left nephrolithiasis 08/31/2015   With mild left hydronephrosis.  Scheduled to undergo shockwave lithotripsy.   Obesity (BMI 30.0-34.9) 12/07/2013   Osteoarthritis of both knees 01/26/2012   Positive PPD 12/07/2013   Pulmonary embolism (HCC) 09/30/2004   Diagnosed in April 2006 after returning from [place].  Likely provoked as it occurred after  a 12 hour plane flight within 2 months of R MCA CVA with left hemiparesis. Patient has made an informed decision to remain on lifelong warfarin.    Tubular adenoma 12/19/2017   Endoscopically excised 04/03/2007.   Urge incontinence of urine 08/31/2015   Possibly secondary to prior CVA.  Treated with Myrbetriq .    Current Outpatient Medications on File Prior to Visit  Medication Sig Dispense Refill   aspirin  EC 81 MG tablet Take 81 mg by mouth daily.     atorvastatin  (LIPITOR) 10 MG tablet Take 1 tablet (10 mg total) by mouth daily. 90 tablet 3   Baclofen  5 MG TABS Take 1 tablet (5 mg total) by mouth 3 (three) times daily as needed (spasticity). 90 tablet 2   benzoyl peroxide -erythromycin  (BENZAMYCIN) gel Apply to affected area 2 times daily (Patient not taking: Reported on 11/27/2023) 47 g 1   ciclopirox  (PENLAC ) 8 % solution Apply topically at bedtime. Apply over nail and surrounding skin. Apply daily over previous coat. After seven (7) days, may remove with alcohol and continue cycle. (Patient not taking: Reported on 06/12/2023) 6.6 mL 0   diclofenac  Sodium (VOLTAREN  ARTHRITIS PAIN) 1 % GEL Apply 2 g topically 4 (four) times daily. 50 g 0   fesoterodine  (TOVIAZ ) 8 MG TB24 tablet Take 1 tablet (8 mg total) by mouth daily. 90 tablet 3   mirabegron  ER (MYRBETRIQ ) 50 MG TB24 tablet Take 1 tablet (50 mg total) by mouth daily. 90 tablet 3   Multiple Vitamin (MULTIVITAMIN) tablet Take 1 tablet by mouth daily.     propranolol   ER (INDERAL  LA) 60 MG 24 hr capsule Take 1 capsule (60 mg total) by mouth daily. 90 capsule 3   tamsulosin (FLOMAX) 0.4 MG CAPS capsule Take 0.4 mg by mouth daily.     tazarotene  (AVAGE ) 0.1 % cream Apply topically at bedtime. To any area of acne. 30 g 0   traMADol  (ULTRAM ) 50 MG tablet Take 50 mg by mouth every 6 (six) hours as needed.     warfarin (COUMADIN ) 5 MG tablet Take one-half (1/2) tablet on Mondays, Wednesdays and Fridays. All other days--Sundays, Tuesdays, Thursdays and  Saturdays, take one (1) tablet. 72 tablet 1   zolpidem  (AMBIEN ) 5 MG tablet Take 1 tablet (5 mg total) by mouth at bedtime as needed for sleep. 30 tablet 2   No current facility-administered medications on file prior to visit.    Family History  Problem Relation Age of Onset   Stroke Maternal Grandmother    Unexplained death Mother    Depression Mother        After husband's death   Unexplained death Father    Osteoarthritis Father        Knees   Early death Sister    Seizures Sister    Early death Brother 40       Unknown cause   Healthy Daughter    Healthy Son    Stroke Maternal Aunt    Healthy Sister    Healthy Sister    Unexplained death Brother    Healthy Brother    Healthy Son     Social History   Socioeconomic History   Marital status: Single    Spouse name: Not on file   Number of children: 3   Years of education: 16   Highest education level: Not on file  Occupational History   Occupation: Retired  Tobacco Use   Smoking status: Former    Current packs/day: 0.00    Types: Cigarettes    Quit date: 06/28/2007    Years since quitting: 16.4   Smokeless tobacco: Never  Vaping Use   Vaping status: Never Used  Substance and Sexual Activity   Alcohol use: Yes    Comment: Rarely.   Drug use: No   Sexual activity: Not on file  Other Topics Concern   Not on file  Social History Narrative   Born in [place] but has been in the [place]  for > 30 years.  Divorced, 1 daughter and 2 sons.  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   Right handed   Coffee/tea sometimes, soda rarely   Social Drivers of Health   Financial Resource Strain: Low Risk  (08/21/2023)   Overall Financial Resource Strain (CARDIA)    Difficulty of Paying Living Expenses: Not hard at all  Food Insecurity: No Food Insecurity (08/21/2023)   Hunger Vital Sign    Worried About Running Out of Food in the Last Year: Never true    Ran Out of Food in the Last  Year: Never true  Transportation Needs: No Transportation Needs (08/21/2023)   PRAPARE - Administrator, Civil Service (Medical): No    Lack of Transportation (Non-Medical): No  Physical Activity: Inactive (01/17/2019)   Exercise Vital Sign    Days of Exercise per Week: 0 days    Minutes of Exercise per Session: 0 min  Stress: No Stress Concern Present (08/21/2023)   [employer] of Occupational Health - Occupational Stress Questionnaire  Feeling of Stress : Not at all  Social Connections: Moderately Isolated (08/21/2023)   Social Connection and Isolation Panel [NHANES]    Frequency of Communication with Friends and Family: More than three times a week    Frequency of Social Gatherings with Friends and Family: More than three times a week    Attends Religious Services: More than 4 times per year    Active Member of [employer] or Organizations: No    Attends Banker Meetings: Never    Marital Status: Divorced  Catering manager Violence: Not At Risk (08/21/2023)   Humiliation, Afraid, Rape, and Kick questionnaire    Fear of Current or Ex-Partner: No    Emotionally Abused: No    Physically Abused: No    Sexually Abused: No   Review of Systems: ROS negative except for what is noted on the assessment and plan.  Vitals:   12/06/23 1313  BP: 133/74  Pulse: 67  Temp: (!) 97.5 F (36.4 C)  TempSrc: Oral  SpO2: 100%  Weight: 228 lb 3.2 oz (103.5 kg)  Height: 6' (1.829 m)   Physical Exam: Sitting in chair, in no acute distress, walks with a cane Heart regular rate and rhythm, no murmurs, euvolemic Lungs clear to auscultation bilaterally, normal respiratory rate and effort Normal range of motion of the right shoulder and elbow; tenderness to deep palpation over the shoulder joint; numbness and tingling elicited in the 3rd through 5th digits on the right hand with compression over the ulnar nerve at the elbow; left sided hemiplegia from a prior stroke; no  swelling, erythema, or skin changes appreciated Strength near baseline on the right  Assessment & Plan:   Patient discussed with Dr. Broadus Canes  History of venous thromboembolism History of pulmonary embolism, on chronic warfarin.  Recently seen on 6/2 in warfarin clinic, INR at goal.  Will continue as prescribed.  Prediabetes A1c 3 months ago was 5.8%.  Will space out checking this and we will check it at his next visit, which will be at a 2-month interval.  He is not currently taking any medications for this.  Continue with lifestyle management.  Chronic pain of both shoulders He presents to the clinic with concerns for acute on chronic right shoulder pain.  He says the pain is about a 4 out of 10.  It is worse when he lies on it at night.  He puts a lot of strain onto the shoulder and elbow joint with his left-sided hemiplegia and ambulation with a cane on the right. He has been taking two 650 mg tylenol  at night.   On previous imaging back in 2009, [facility] joint osteoarthritis was noted in both shoulders, which is likely progressed since then.  He was referred for physical therapy at his last visit, but has not gone yet.  His exam was overall reassuring, but did demonstrate findings consistent with right shoulder osteoarthritis and ulnar nerve impingement at the elbow.  I recommended he use Voltaren  gel on the shoulder; he has someone who can help him apply this. I have sent a referral to sports medicine, as I believe he may benefit from further therapy and possibly joint injections.   Dorthy Gavia, MD [facility] Internal Medicine, PGY-1 Phone: [phone] Date 12/06/2023 Time 1:48 PM

## 2023-12-06 NOTE — Assessment & Plan Note (Addendum)
 He presents to the clinic with concerns for acute on chronic right shoulder pain.  He says the pain is about a 4 out of 10.  It is worse when he lies on it at night.  He puts a lot of strain onto the shoulder and elbow joint with his left-sided hemiplegia and ambulation with a cane on the right. He has been taking two 650 mg tylenol  at night.   On previous imaging back in 2009, [facility] joint osteoarthritis was noted in both shoulders, which is likely progressed since then.  He was referred for physical therapy at his last visit, but has not gone yet.  His exam was overall reassuring, but did demonstrate findings consistent with right shoulder osteoarthritis and ulnar nerve impingement at the elbow.  I recommended he use Voltaren  gel on the shoulder; he has someone who can help him apply this. I have sent a referral to sports medicine, as I believe he may benefit from further therapy and possibly joint injections.

## 2023-12-06 NOTE — Assessment & Plan Note (Signed)
 History of pulmonary embolism, on chronic warfarin.  Recently seen on 6/2 in warfarin clinic, INR at goal.  Will continue as prescribed.

## 2023-12-13 ENCOUNTER — Ambulatory Visit: Payer: Self-pay

## 2023-12-13 ENCOUNTER — Other Ambulatory Visit: Payer: Self-pay | Admitting: Student

## 2023-12-13 DIAGNOSIS — G8929 Other chronic pain: Secondary | ICD-10-CM

## 2023-12-13 NOTE — Telephone Encounter (Signed)
 FYI Only or Action Required?: Action required by provider  Patient was last seen in primary care on 12/06/2023 by Alexander Gavia, MD. Called Nurse Triage reporting Numbness. Symptoms began see last office visit notes. Interventions attempted: Other: see office visit notes. Symptoms are: unchanged.  Triage Disposition: Call PCP Within 24 Hours  Patient/caregiver understands and will follow disposition?: Yes      Copied from CRM [id number]. Topic: Clinical - Red Word Triage >> Dec 13, 2023  8:46 AM Alexander English wrote: Red Word that prompted transfer to Nurse Triage: Patient is experiencing numbness in his right shoulder that has extended to the arm and the fingers of the patient. Additional Information  Commented on: [1] Tingling in hand (e.g., pins and needles) AND [2] after prolonged lying on arm AND [3]  brief (now gone)    Please see notes.  Answer Assessment - Initial Assessment Questions Patient has seen provider for these symptoms on 06/11. No changes in symptoms. Patient's reason for call is to check on the status of his referral. This RN informed patient that a referral can take up to two weeks for a patient to be contacted. Patient also asking if his PCP can order an X Ray for his Right shoulder/Arm. Patient would like call back if further information or X Ray can be ordered.    1. SYMPTOM: What is the main symptom you are concerned about? (e.g., weakness, numbness)     Numbness that started in right shoulder and has progressed to fingers. He feels an electrical current feeling at times. 2. ONSET: When did this start? (minutes, hours, days; while sleeping)     Patient has been examined for this by Dr. Duncan English 5. CARDIAC SYMPTOMS: Have you had any of the following symptoms: chest pain, difficulty breathing, palpitations?     No chest pain or difficulty breathing 6. NEUROLOGIC SYMPTOMS: Have you had any of the following symptoms: headache, dizziness, vision loss, double vision,  changes in speech, unsteady on your feet?     Patient states no change in symptoms since he last saw Dr. Duncan English last week.  Protocols used: Neurologic Deficit-A-AH

## 2023-12-13 NOTE — Telephone Encounter (Signed)
 Call to patient was informed that the doctor has oreded a shoulder Xray for hinm .  The Xray can be done at [facility].  Patient was asked to call for questions.

## 2023-12-14 ENCOUNTER — Other Ambulatory Visit: Payer: Self-pay | Admitting: Internal Medicine

## 2023-12-14 ENCOUNTER — Ambulatory Visit (HOSPITAL_COMMUNITY)
Admission: RE | Admit: 2023-12-14 | Discharge: 2023-12-14 | Disposition: A | Discharge status: Home / Self Care | Source: Ambulatory Visit | Attending: Internal Medicine | Admitting: Internal Medicine

## 2023-12-14 DIAGNOSIS — G8929 Other chronic pain: Secondary | ICD-10-CM | POA: Diagnosis not present

## 2023-12-14 DIAGNOSIS — M25511 Pain in right shoulder: Secondary | ICD-10-CM | POA: Diagnosis not present

## 2023-12-14 DIAGNOSIS — M19011 Primary osteoarthritis, right shoulder: Secondary | ICD-10-CM | POA: Diagnosis not present

## 2023-12-14 NOTE — Progress Notes (Signed)
 Internal Medicine Clinic Attending  Case discussed with the resident at the time of the visit.  We reviewed the resident's history and exam and pertinent patient test results.  I agree with the assessment, diagnosis, and plan of care documented in the resident's note.

## 2023-12-19 ENCOUNTER — Telehealth: Payer: Self-pay | Admitting: *Deleted

## 2023-12-19 NOTE — Telephone Encounter (Signed)
 Copied from CRM [id number]. Topic: Clinical - Lab/Test Results >> Dec 19, 2023 11:42 AM Zane F wrote: Reason for CRM:    Patient called in to receive the results of recent x-rays of his shoulder DG Shoulder Right (Accession [id number]) (Order [id number]) and DG Humerus Right (Accession [id number]) (Order [id number]).   Please call patient to relay results.   Callback Number: [phone]  If you are unable to reach the patient, please leave a detailed message.

## 2023-12-20 NOTE — Telephone Encounter (Signed)
 RTC to patient-message was left on Voice mail that Radiology has not read his Xray as of yet.  Dr. Rosan will; give him a call when the results are ready.

## 2023-12-25 ENCOUNTER — Ambulatory Visit (INDEPENDENT_AMBULATORY_CARE_PROVIDER_SITE_OTHER): Admitting: Pharmacist

## 2023-12-25 ENCOUNTER — Ambulatory Visit: Admitting: Internal Medicine

## 2023-12-25 ENCOUNTER — Ambulatory Visit: Payer: Self-pay | Admitting: Internal Medicine

## 2023-12-25 VITALS — BP 140/68 | HR 61 | Temp 97.8°F | Wt 229.9 lb

## 2023-12-25 DIAGNOSIS — Z7901 Long term (current) use of anticoagulants: Secondary | ICD-10-CM | POA: Diagnosis not present

## 2023-12-25 DIAGNOSIS — M19011 Primary osteoarthritis, right shoulder: Secondary | ICD-10-CM

## 2023-12-25 DIAGNOSIS — I639 Cerebral infarction, unspecified: Secondary | ICD-10-CM | POA: Diagnosis not present

## 2023-12-25 DIAGNOSIS — Z86718 Personal history of other venous thrombosis and embolism: Secondary | ICD-10-CM

## 2023-12-25 DIAGNOSIS — Z86711 Personal history of pulmonary embolism: Secondary | ICD-10-CM | POA: Diagnosis not present

## 2023-12-25 LAB — POCT INR: INR: 2.9 (ref 2.0–3.0)

## 2023-12-25 MED ORDER — LIDOCAINE HCL (PF) 1 % IJ SOLN
2.0000 mL | Freq: Once | INTRAMUSCULAR | Status: AC
  Administered 2023-12-25: 2 mL

## 2023-12-25 MED ORDER — TRIAMCINOLONE ACETONIDE 40 MG/ML IJ SUSP
40.0000 mg | Freq: Once | INTRAMUSCULAR | Status: AC
  Administered 2023-12-25: 40 mg via INTRA_ARTICULAR

## 2023-12-25 NOTE — Assessment & Plan Note (Signed)
 Discussed that his pain could be coming from a number of locations including the glenohumeral shoulder as well as the [facility] joint.  Given the generalized nature I discussed with him we certainly could do a subacromial steroid injection today.  This could have some diagnostic and therapeutic effect if it significantly improved by the time he seen Dr. Thor.  He was very interested in this and agreeable to proceed with a injection today.

## 2023-12-25 NOTE — Patient Instructions (Addendum)
 Patient instructed to take medications as defined in the Anti-coagulation Track section of this encounter.  Patient instructed to take today's dose.  Patient instructed to take one (1) of your 5 mg strength, peach-colored warfarin tablets by mouth, once-daily every day--EXCEPT on MONDAYS and THURSDAYS. Take ONLY one-half (1/2) tablet on MONDAYS and THURSDAYS. Patient verbalized understanding of these instructions.

## 2023-12-25 NOTE — Progress Notes (Signed)
 Anticoagulation Management Alexander English is a 68 y.o. male who reports to the clinic for monitoring of warfarin treatment.    Indication: DVT/PE, History of; Long term current use of oral anticoagulation with warfarin to maintain INR target range of 2.0 - 3.0.   Duration: indefinite Supervising physician: Mliss Foot, MD  Anticoagulation Clinic Visit History: Patient does not report signs/symptoms of bleeding or thromboembolism  Other recent changes: No diet, medications, lifestyle except as noted in patient findings section, which see.  Anticoagulation Episode Summary     Current INR goal:  2.0-3.0  TTR:  81.0% (11.8 y)  Next INR check:  01/22/2024  INR from last check:  2.9 (12/25/2023)  Weekly max warfarin dose:  --  Target end date:  Indefinite  INR check location:  Anticoagulation Clinic  Preferred lab:  --  Send INR reminders to:  [facility]   Indications   History of venous thromboembolism [Z86.718]        Comments:  --         No Known Allergies  Current Outpatient Medications:    fesoterodine  (TOVIAZ ) 8 MG TB24 tablet, Take 1 tablet (8 mg total) by mouth daily., Disp: 90 tablet, Rfl: 3   mirabegron  ER (MYRBETRIQ ) 50 MG TB24 tablet, Take 1 tablet (50 mg total) by mouth daily., Disp: 90 tablet, Rfl: 3   Multiple Vitamin (MULTIVITAMIN) tablet, Take 1 tablet by mouth daily., Disp: , Rfl:    propranolol  ER (INDERAL  LA) 60 MG 24 hr capsule, Take 1 capsule (60 mg total) by mouth daily., Disp: 90 capsule, Rfl: 3   tamsulosin (FLOMAX) 0.4 MG CAPS capsule, Take 0.4 mg by mouth daily., Disp: , Rfl:    tazarotene  (AVAGE ) 0.1 % cream, Apply topically at bedtime. To any area of acne., Disp: 30 g, Rfl: 0   traMADol  (ULTRAM ) 50 MG tablet, Take 50 mg by mouth every 6 (six) hours as needed., Disp: , Rfl:    warfarin (COUMADIN ) 5 MG tablet, Take one-half (1/2) tablet on Mondays, Wednesdays and Fridays. All other days--Sundays, Tuesdays, Thursdays and Saturdays, take one (1)  tablet., Disp: 72 tablet, Rfl: 1   zolpidem  (AMBIEN ) 5 MG tablet, Take 1 tablet (5 mg total) by mouth at bedtime as needed for sleep., Disp: 30 tablet, Rfl: 2   aspirin  EC 81 MG tablet, Take 81 mg by mouth daily., Disp: , Rfl:    atorvastatin  (LIPITOR) 10 MG tablet, Take 1 tablet (10 mg total) by mouth daily., Disp: 90 tablet, Rfl: 3   Baclofen  5 MG TABS, Take 1 tablet (5 mg total) by mouth 3 (three) times daily as needed (spasticity)., Disp: 90 tablet, Rfl: 2   benzoyl peroxide -erythromycin  (BENZAMYCIN) gel, Apply to affected area 2 times daily (Patient not taking: Reported on 12/25/2023), Disp: 47 g, Rfl: 1   ciclopirox  (PENLAC ) 8 % solution, Apply topically at bedtime. Apply over nail and surrounding skin. Apply daily over previous coat. After seven (7) days, may remove with alcohol and continue cycle. (Patient not taking: Reported on 12/25/2023), Disp: 6.6 mL, Rfl: 0   diclofenac  Sodium (VOLTAREN  ARTHRITIS PAIN) 1 % GEL, Apply 2 g topically 4 (four) times daily. (Patient not taking: Reported on 12/25/2023), Disp: 50 g, Rfl: 0 Past Medical History:  Diagnosis Date   Aortic atherosclerosis (HCC) 10/23/2017   Asymptomatic, seen on CT scan   Bilateral cataracts 05/18/2016   Not visually significant   Cancer (HCC)    maxillary   Cerebrovascular accident (CVA) (HCC) 03/27/2020  Chronic constipation 06/12/2013   Chronic low back pain 06/12/2013   L4-5 facet arthropathy    Chronic pain of both shoulders 12/07/2013   A/C joint osteoarthritis    Cluster headaches    Resolved in 2006   COPD (chronic obstructive pulmonary disease) (HCC)    Dry eye syndrome, bilateral 05/18/2016   Embolic stroke involving right middle cerebral artery (HCC) 08/08/2004   Occured after traveling from [place]  to [place] where he was hospitalized for 1 month with no further work-up or therapy.  [place] and was admitted to [facility] immediately after landing. Presumed embolic per neuro but TEE, bubble study,  and hypercoag W/U negative. On indefinite coumadin  per patient's informed preference.  Residual effects : Left spastic hemiplegia. Tx with phenol tibi   Generalized anxiety disorder 08/03/2018   History of kidney stones    Hyperplastic colon polyp 07/27/2012   Excised endoscopically 07/27/2012   Hypertensive retinopathy of both eyes 05/18/2016   Internal and external hemorrhoids without complication 12/19/2017   Seen on colonoscopy 04/03/2007.   Left nephrolithiasis 08/31/2015   With mild left hydronephrosis.  Scheduled to undergo shockwave lithotripsy.   Obesity (BMI 30.0-34.9) 12/07/2013   Osteoarthritis of both knees 01/26/2012   Positive PPD 12/07/2013   Pulmonary embolism (HCC) 09/30/2004   Diagnosed in April 2006 after returning from [place].  Likely provoked as it occurred after a 12 hour plane flight within 2 months of R MCA CVA with left hemiparesis. Patient has made an informed decision to remain on lifelong warfarin.    Tubular adenoma 12/19/2017   Endoscopically excised 04/03/2007.   Urge incontinence of urine 08/31/2015   Possibly secondary to prior CVA.  Treated with Myrbetriq .   Social History   Socioeconomic History   Marital status: Single    Spouse name: Not on file   Number of children: 3   Years of education: 16   Highest education level: Not on file  Occupational History   Occupation: Retired  Tobacco Use   Smoking status: Former    Current packs/day: 0.00    Types: Cigarettes    Quit date: 06/28/2007    Years since quitting: 16.5   Smokeless tobacco: Never  Vaping Use   Vaping status: Never Used  Substance and Sexual Activity   Alcohol use: Yes    Comment: Rarely.   Drug use: No   Sexual activity: Not on file  Other Topics Concern   Not on file  Social History Narrative   Born in [place] but has been in the [place]  for > 30 years.  Divorced, 1 daughter and 2 sons.  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    Right handed   Coffee/tea sometimes, soda rarely   Social Drivers of Health   Financial Resource Strain: Low Risk  (08/21/2023)   Overall Financial Resource Strain (CARDIA)    Difficulty of Paying Living Expenses: Not hard at all  Food Insecurity: No Food Insecurity (08/21/2023)   Hunger Vital Sign    Worried About Running Out of Food in the Last Year: Never true    Ran Out of Food in the Last Year: Never true  Transportation Needs: No Transportation Needs (08/21/2023)   PRAPARE - Administrator, Civil Service (Medical): No    Lack of Transportation (Non-Medical): No  Physical Activity: Inactive (01/17/2019)   Exercise Vital Sign    Days of Exercise per Week: 0 days  Minutes of Exercise per Session: 0 min  Stress: No Stress Concern Present (08/21/2023)   [employer] of Occupational Health - Occupational Stress Questionnaire    Feeling of Stress : Not at all  Social Connections: Moderately Isolated (08/21/2023)   Social Connection and Isolation Panel    Frequency of Communication with Friends and Family: More than three times a week    Frequency of Social Gatherings with Friends and Family: More than three times a week    Attends Religious Services: More than 4 times per year    Active Member of [employer] or Organizations: No    Attends Engineer, structural: Never    Marital Status: Divorced   Family History  Problem Relation Age of Onset   Stroke Maternal Grandmother    Unexplained death Mother    Depression Mother        After husband's death   Unexplained death Father    Osteoarthritis Father        Knees   Early death Sister    Seizures Sister    Early death Brother 54       Unknown cause   Healthy Daughter    Healthy Son    Stroke Maternal Aunt    Healthy Sister    Healthy Sister    Unexplained death Brother    Healthy Brother    Healthy Son     ASSESSMENT Recent Results: The most recent result is correlated with 35 mg per week: Lab  Results  Component Value Date   INR 2.9 12/25/2023   INR 2.1 11/27/2023   INR 1.8 (A) 10/23/2023    Anticoagulation Dosing: Description   Take one (1) of your 5 mg strength, peach-colored warfarin tablets by mouth, once-daily every day--EXCEPT on MONDAYS and THURSDAYS. Take ONLY one-half (1/2) tablet on MONDAYS and THURSDAYS.      INR today: Therapeutic  PLAN Weekly dose was decreased by 13% to 30 mg per week  Patient Instructions  Patient instructed to take medications as defined in the Anti-coagulation Track section of this encounter.  Patient instructed to take today's dose.  Patient instructed to take one (1) of your 5 mg strength, peach-colored warfarin tablets by mouth, once-daily every day--EXCEPT on MONDAYS and THURSDAYS. Take ONLY one-half (1/2) tablet on MONDAYS and THURSDAYS. Patient verbalized understanding of these instructions.  Patient advised to contact clinic or seek medical attention if signs/symptoms of bleeding or thromboembolism occur.  Patient verbalized understanding by repeating back information and was advised to contact me if further medication-related questions arise. Patient was also provided an information handout.  Follow-up Return in 4 weeks (on 01/22/2024) for Follow up INR.  Joziah Dollins B Kathelyn Gombos, PharmD,CPP Clinical Pharmacist Practitioner  15 minutes spent face-to-face with the patient during the encounter. 50% of time spent on education, including signs/sx bleeding and clotting, as well as food and drug interactions with warfarin. 50% of time was spent on fingerprick POC INR sample collection,processing, results determination, and documentation in [email or web address].

## 2023-12-25 NOTE — Progress Notes (Signed)
 Established Patient Office Visit  Subjective   Patient ID: Alexander English, male    DOB: 08-14-1955  Age: 68 y.o. MRN: [id number]  Chief Complaint  Patient presents with   Right Shoulder Pain    For injection   [name] was here to see Dr. Ruthell for his Coumadin  clinic visit.  And asked if he could be seen by me.  He recently was seen in our clinic for right shoulder pain and x-rays were ordered.  He was noted to have glenohumeral OA as well as AC joint OA with possible foreign body.  He has pain with abduction but also generally with all range of motion exercises of the shoulder.  He notes some occasional numbness and tingling of his right fifth digit he denies any neck pain.  He actually has a visit with sports medicine doctor huddall on Thursday.  However he was interested in having a steroid injection from me today.     Objective:     BP (!) 140/68 (BP Location: Right Arm, Patient Position: Sitting, Cuff Size: Large)   Pulse 61   Temp 97.8 F (36.6 C) (Oral)   Wt 229 lb 14.4 oz (104.3 kg)   BMI 31.18 kg/m  BP Readings from Last 3 Encounters:  12/25/23 (!) 140/68  12/06/23 133/74  08/21/23 128/80   Wt Readings from Last 3 Encounters:  12/25/23 229 lb 14.4 oz (104.3 kg)  12/06/23 228 lb 3.2 oz (103.5 kg)  08/21/23 236 lb 9.6 oz (107.3 kg)      Physical Exam Vitals and nursing note reviewed.  Constitutional:      Appearance: Normal appearance.   Musculoskeletal:     Right shoulder: Tenderness present. No swelling or effusion. Decreased range of motion.     Comments: Neg empty can, positive neers on right   Neurological:     Mental Status: He is alert.      Results for orders placed or performed in visit on 12/25/23  POCT INR  Result Value Ref Range   INR 2.9 2.0 - 3.0   POC INR      Last CBC Lab Results  Component Value Date   WBC 5.4 02/12/2021   HGB 13.7 02/12/2021   HCT 44.5 02/12/2021   MCV 84.9 02/12/2021   MCH 26.1 02/12/2021   RDW 14.5  02/12/2021   PLT 169 02/12/2021   Last metabolic panel Lab Results  Component Value Date   GLUCOSE 93 10/14/2021   NA 141 10/14/2021   K 4.1 10/14/2021   CL 106 10/14/2021   CO2 19 (L) 10/14/2021   BUN 12 10/14/2021   CREATININE 0.86 10/14/2021   EGFR 95 10/14/2021   CALCIUM  9.6 10/14/2021   PROT 8.0 10/15/2020   ALBUMIN 4.5 10/15/2020   LABGLOB 3.5 10/15/2020   AGRATIO 1.3 10/15/2020   BILITOT 0.4 10/15/2020   ALKPHOS 83 10/15/2020   AST 21 10/15/2020   ALT 25 10/15/2020   ANIONGAP 6 02/12/2021      The ASCVD Risk score (Arnett DK, et al., 2019) failed to calculate for the following reasons:   Risk score cannot be calculated because patient has a medical history suggesting prior/existing ASCVD    Assessment & Plan:   Problem List Items Addressed This Visit   None Visit Diagnoses       Primary osteoarthritis of right shoulder    -  Primary   Relevant Medications   triamcinolone acetonide (KENALOG-40) injection 40 mg (Completed)   lidocaine  (PF) (  XYLOCAINE ) 1 % injection 2 mL (Completed)      PROCEDURE NOTE  PROCEDURE: right shoulder joint steroid injection.  PREOPERATIVE DIAGNOSIS: Bursitis of the right shoulder.  POSTOPERATIVE DIAGNOSIS: Bursitis of the right shoulder.  PROCEDURE: The patient was apprised of the risks and the benefits of the procedure and informed consent was obtained, as witnessed by [name]. Time-out procedure was performed, with confirmation of the patient's name, date of birth, and correct identification of the right shoulder to be injected. The patient's shoulder was then marked at the appropriate site for injection placement. The shoulder was sterilely prepped with Betadine. A 40 mg (1 milliliter) solution of Kenalog was drawn up into a 3 mL syringe with a 2 mL of 1% lidocaine . The patient was injected with a 25 gauge needle at the lateral  aspect of his  right shoulder. There were no complications. The patient tolerated the procedure well.  There was minimal bleeding. The patient was instructed to ice her shoulder upon leaving clinic and refrain from overuse over the next 3 days. The patient was instructed to go to the emergency room with any usual pain, swelling, or redness occurred in the injected area. The patient was given a followup appointment to evaluate response to the injection to his increased range of motion and reduction of pain.     Return in about 3 months (around 03/26/2024) for HTN.    Dayton JAYSON Eastern, DO

## 2023-12-25 NOTE — Progress Notes (Signed)
Evaluation and management procedures were performed by the Clinical Pharmacy Practitioner under my supervision and collaboration. I have reviewed the Practitioner's note and chart, and I agree with the management and plan as documented above. ° °

## 2023-12-25 NOTE — Patient Instructions (Signed)
Keep your appointment with sports medicine.

## 2023-12-27 ENCOUNTER — Ambulatory Visit: Admitting: Family Medicine

## 2024-01-08 ENCOUNTER — Ambulatory Visit: Admitting: Family Medicine

## 2024-01-08 VITALS — BP 138/71 | Ht 72.0 in | Wt 232.0 lb

## 2024-01-08 DIAGNOSIS — M5010 Cervical disc disorder with radiculopathy, unspecified cervical region: Secondary | ICD-10-CM | POA: Diagnosis not present

## 2024-01-08 MED ORDER — PREDNISONE 10 MG PO TABS: ORAL_TABLET | ORAL | 0 refills | Status: AC

## 2024-01-08 NOTE — Patient Instructions (Signed)
 You have cervical radiculopathy (a pinched nerve in the neck). Prednisone  6 day dose pack to relieve irritation/inflammation of the nerve. You can take tylenol  in addition to this. Consider cervical collar if severely painful. Simple range of motion exercises within limits of pain to prevent further stiffness. Start physical therapy for stretching, exercises, traction, and modalities. Heat 15 minutes at a time 3-4 times a day to help with spasms. Watch head position when on computers, texting, when sleeping in bed - should in line with back to prevent further nerve traction and irritation. Consider home traction unit if you get benefit with this in physical therapy. If not improving we will consider an MRI. Message or call me in a week to let me know how you're doing.

## 2024-01-09 ENCOUNTER — Encounter: Payer: Self-pay | Admitting: Family Medicine

## 2024-01-09 NOTE — Progress Notes (Signed)
 PCP: Rosan Dayton BROCKS, DO  Subjective:   HPI: Patient is a 68 y.o. male here for right arm pain.  Patient reports he's had pain in right shoulder, arm, scapular area since May. No acute injury or trauma. He saw his PCP on 6/30 and had a subacromial injection - reports this helped for about 24 hours. Has been taking tylenol . Uses a cane regularly related to stroke he had about 20 years ago causing left hemiplegia. Reports numbness that is new radiating from his right neck/shoulder down his arm to 4th and 5th digits. Difficulty getting comfortable.  Past Medical History:  Diagnosis Date   Aortic atherosclerosis (HCC) 10/23/2017   Asymptomatic, seen on CT scan   Bilateral cataracts 05/18/2016   Not visually significant   Cancer (HCC)    maxillary   Cerebrovascular accident (CVA) (HCC) 03/27/2020   Chronic constipation 06/12/2013   Chronic low back pain 06/12/2013   L4-5 facet arthropathy    Chronic pain of both shoulders 12/07/2013   A/C joint osteoarthritis    Cluster headaches    Resolved in 2006   COPD (chronic obstructive pulmonary disease) (HCC)    Dry eye syndrome, bilateral 05/18/2016   Embolic stroke involving right middle cerebral artery (HCC) 08/08/2004   Occured after traveling from [place]  to [place] where he was hospitalized for 1 month with no further work-up or therapy.  [place] and was admitted to [facility] immediately after landing. Presumed embolic per neuro but TEE, bubble study, and hypercoag W/U negative. On indefinite coumadin  per patient's informed preference.  Residual effects : Left spastic hemiplegia. Tx with phenol tibi   Generalized anxiety disorder 08/03/2018   History of kidney stones    Hyperplastic colon polyp 07/27/2012   Excised endoscopically 07/27/2012   Hypertensive retinopathy of both eyes 05/18/2016   Internal and external hemorrhoids without complication 12/19/2017   Seen on colonoscopy 04/03/2007.   Left nephrolithiasis  08/31/2015   With mild left hydronephrosis.  Scheduled to undergo shockwave lithotripsy.   Obesity (BMI 30.0-34.9) 12/07/2013   Osteoarthritis of both knees 01/26/2012   Positive PPD 12/07/2013   Pulmonary embolism (HCC) 09/30/2004   Diagnosed in April 2006 after returning from [place].  Likely provoked as it occurred after a 12 hour plane flight within 2 months of R MCA CVA with left hemiparesis. Patient has made an informed decision to remain on lifelong warfarin.    Tubular adenoma 12/19/2017   Endoscopically excised 04/03/2007.   Urge incontinence of urine 08/31/2015   Possibly secondary to prior CVA.  Treated with Myrbetriq .    Current Outpatient Medications on File Prior to Visit  Medication Sig Dispense Refill   aspirin  EC 81 MG tablet Take 81 mg by mouth daily.     atorvastatin  (LIPITOR) 10 MG tablet Take 1 tablet (10 mg total) by mouth daily. 90 tablet 3   Baclofen  5 MG TABS Take 1 tablet (5 mg total) by mouth 3 (three) times daily as needed (spasticity). 90 tablet 2   benzoyl peroxide -erythromycin  (BENZAMYCIN) gel Apply to affected area 2 times daily (Patient not taking: Reported on 12/25/2023) 47 g 1   ciclopirox  (PENLAC ) 8 % solution Apply topically at bedtime. Apply over nail and surrounding skin. Apply daily over previous coat. After seven (7) days, may remove with alcohol and continue cycle. (Patient not taking: Reported on 12/25/2023) 6.6 mL 0   diclofenac  Sodium (VOLTAREN  ARTHRITIS PAIN) 1 % GEL Apply 2 g topically 4 (four) times daily. (Patient not  taking: Reported on 12/25/2023) 50 g 0   fesoterodine  (TOVIAZ ) 8 MG TB24 tablet Take 1 tablet (8 mg total) by mouth daily. 90 tablet 3   mirabegron  ER (MYRBETRIQ ) 50 MG TB24 tablet Take 1 tablet (50 mg total) by mouth daily. 90 tablet 3   Multiple Vitamin (MULTIVITAMIN) tablet Take 1 tablet by mouth daily.     propranolol  ER (INDERAL  LA) 60 MG 24 hr capsule Take 1 capsule (60 mg total) by mouth daily. 90 capsule 3   tamsulosin  (FLOMAX) 0.4 MG CAPS capsule Take 0.4 mg by mouth daily.     tazarotene  (AVAGE ) 0.1 % cream Apply topically at bedtime. To any area of acne. 30 g 0   warfarin (COUMADIN ) 5 MG tablet Take one-half (1/2) tablet on Mondays, Wednesdays and Fridays. All other days--Sundays, Tuesdays, Thursdays and Saturdays, take one (1) tablet. 72 tablet 1   zolpidem  (AMBIEN ) 5 MG tablet Take 1 tablet (5 mg total) by mouth at bedtime as needed for sleep. 30 tablet 2   No current facility-administered medications on file prior to visit.    Past Surgical History:  Procedure Laterality Date   CYSTOSCOPY W/ RETROGRADES Right 02/22/2021   Procedure: CYSTOSCOPY WITH RETROGRADE PYELOGRAM/ URETEROSCOPY/ POSSIBLE BIOPSY OF LESION, POSSIBLE RIGHT STENT PLACEMENT;  Surgeon: Selma [name] SAUNDERS, MD;  Location: [facility] ORS;  Service: Urology;  Laterality: Right;  ONLY NEEDS 30 MIN   EXCISION NASAL MASS Left 10/06/2017   Procedure: EXCISION LEFT NASAL MASS;  Surgeon: Jesus Oliphant, MD;  Location: [facility] OR;  Service: ENT;  Laterality: Left;  Removal of intvering papilloma frozen section   FOOT SURGERY Bilateral    Bunion   I & D EXTREMITY Left 11/28/2001   Left thumb thenar space abscess.   LITHOTRIPSY  2017   MAXILLECTOMY Left 11/22/2017   thelbert 11/22/2017   MAXILLECTOMY Left 11/22/2017   Procedure: MAXILLECTOMY;  Surgeon: Jesus Oliphant, MD;  Location: [facility] OR;  Service: ENT;  Laterality: Left;   NASAL SINUS SURGERY Left 10/06/2017   Procedure: ENDOSCOPIC SINUS SURGERY;  Surgeon: Jesus Oliphant, MD;  Location: [facility] OR;  Service: ENT;  Laterality: Left;  Left side Endoscopic Macillary and Ethmoid Sinus   SKIN SPLIT GRAFT Left 11/22/2017   Procedure: SKIN GRAFT SPLIT THICKNESS;  Surgeon: Jesus Oliphant, MD;  Location: [facility] OR;  Service: ENT;  Laterality: Left;    No Known Allergies  BP 138/71   Ht 6' (1.829 m)   Wt 232 lb (105.2 kg)   BMI 31.46 kg/m       No data to display              No data to display              Objective:   Physical Exam:  Gen: NAD, comfortable in exam room  Neck: No gross deformity, swelling, bruising. TTP mildly right trapezius area.  No midline/bony TTP. Motion limited to about 30 degrees flexion, 5 degrees extension, 20 degrees bilateral lateral rotation. Right upper extremity strength 5/5. Sensation intact to light touch.   1+ reflex in right biceps, trace right triceps, brachioradialis tendons. Positive spurlings. Sensation intact to light touch right upper extremity.   Right shoulder: No swelling, ecchymoses.  No gross deformity. TTP mildly right trapezius. FROM. Negative Hawkins, Neers. Negative Yergasons. Strength 5/5 with empty can and resisted internal/external rotation without pain. NV intact distally.   Assessment & Plan:  1. Cervical radiculopathy - Patient's shoulder pain has improved following subacromial injection.  Pain  currently due to cervical radiculopathy.  Discussed options.  He is on warfarin so will avoid nsaids.  He will start physical therapy, given severity take prednisone  dose pack.  Heat, ergonomic issues discussed.  Consider MRI if not improving.  Let us  know how he's doing in 1 week.

## 2024-01-15 DIAGNOSIS — M5412 Radiculopathy, cervical region: Secondary | ICD-10-CM | POA: Diagnosis not present

## 2024-01-15 DIAGNOSIS — M25511 Pain in right shoulder: Secondary | ICD-10-CM | POA: Diagnosis not present

## 2024-01-22 ENCOUNTER — Ambulatory Visit: Admitting: Pharmacist

## 2024-01-22 DIAGNOSIS — Z7901 Long term (current) use of anticoagulants: Secondary | ICD-10-CM

## 2024-01-22 DIAGNOSIS — Z86718 Personal history of other venous thrombosis and embolism: Secondary | ICD-10-CM

## 2024-01-22 DIAGNOSIS — I639 Cerebral infarction, unspecified: Secondary | ICD-10-CM | POA: Diagnosis not present

## 2024-01-22 LAB — POCT INR: INR: 2 (ref 2.0–3.0)

## 2024-01-22 NOTE — Progress Notes (Signed)
 Anticoagulation Management Alexander English is a 68 y.o. male who reports to the clinic for monitoring of warfarin treatment.    Indication: DVT, History of; Long term current use of oral anticoagulant with warfarin. Target INR range 2.0 - 3.0.  Duration: indefinite Supervising physician: Mliss LABOR. Trudy, MD  Anticoagulation Clinic Visit History: Patient does not report signs/symptoms of bleeding or thromboembolism  Other recent changes: No diet, medications, lifestyle changes.  Anticoagulation Episode Summary     Current INR goal:  2.0-3.0  TTR:  81.2% (11.8 y)  Next INR check:  02/19/2024  INR from last check:  2.0 (01/22/2024)  Weekly max warfarin dose:  --  Target end date:  Indefinite  INR check location:  Anticoagulation Clinic  Preferred lab:  --  Send INR reminders to:  [facility]   Indications   History of venous thromboembolism [Z86.718]        Comments:  --         No Known Allergies  Current Outpatient Medications:    aspirin  EC 81 MG tablet, Take 81 mg by mouth daily., Disp: , Rfl:    atorvastatin  (LIPITOR) 10 MG tablet, Take 1 tablet (10 mg total) by mouth daily., Disp: 90 tablet, Rfl: 3   Baclofen  5 MG TABS, Take 1 tablet (5 mg total) by mouth 3 (three) times daily as needed (spasticity)., Disp: 90 tablet, Rfl: 2   fesoterodine  (TOVIAZ ) 8 MG TB24 tablet, Take 1 tablet (8 mg total) by mouth daily., Disp: 90 tablet, Rfl: 3   mirabegron  ER (MYRBETRIQ ) 50 MG TB24 tablet, Take 1 tablet (50 mg total) by mouth daily., Disp: 90 tablet, Rfl: 3   Multiple Vitamin (MULTIVITAMIN) tablet, Take 1 tablet by mouth daily., Disp: , Rfl:    predniSONE  (DELTASONE ) 10 MG tablet, 6 tabs po day 1, 5 tabs po day 2, 4 tabs po day 3, 3 tabs po day 4, 2 tabs po day 5, 1 tab po day 6, Disp: 21 tablet, Rfl: 0   propranolol  ER (INDERAL  LA) 60 MG 24 hr capsule, Take 1 capsule (60 mg total) by mouth daily., Disp: 90 capsule, Rfl: 3   tamsulosin (FLOMAX) 0.4 MG CAPS capsule, Take 0.4  mg by mouth daily., Disp: , Rfl:    tazarotene  (AVAGE ) 0.1 % cream, Apply topically at bedtime. To any area of acne., Disp: 30 g, Rfl: 0   warfarin (COUMADIN ) 5 MG tablet, Take one-half (1/2) tablet on Mondays, Wednesdays and Fridays. All other days--Sundays, Tuesdays, Thursdays and Saturdays, take one (1) tablet., Disp: 72 tablet, Rfl: 1   zolpidem  (AMBIEN ) 5 MG tablet, Take 1 tablet (5 mg total) by mouth at bedtime as needed for sleep., Disp: 30 tablet, Rfl: 2   benzoyl peroxide -erythromycin  (BENZAMYCIN) gel, Apply to affected area 2 times daily (Patient not taking: Reported on 01/22/2024), Disp: 47 g, Rfl: 1   ciclopirox  (PENLAC ) 8 % solution, Apply topically at bedtime. Apply over nail and surrounding skin. Apply daily over previous coat. After seven (7) days, may remove with alcohol and continue cycle. (Patient not taking: Reported on 01/22/2024), Disp: 6.6 mL, Rfl: 0   diclofenac  Sodium (VOLTAREN  ARTHRITIS PAIN) 1 % GEL, Apply 2 g topically 4 (four) times daily. (Patient not taking: Reported on 01/22/2024), Disp: 50 g, Rfl: 0 Past Medical History:  Diagnosis Date   Aortic atherosclerosis (HCC) 10/23/2017   Asymptomatic, seen on CT scan   Bilateral cataracts 05/18/2016   Not visually significant   Cancer (HCC)    maxillary  Cerebrovascular accident (CVA) (HCC) 03/27/2020   Chronic constipation 06/12/2013   Chronic low back pain 06/12/2013   L4-5 facet arthropathy    Chronic pain of both shoulders 12/07/2013   A/C joint osteoarthritis    Cluster headaches    Resolved in 2006   COPD (chronic obstructive pulmonary disease) (HCC)    Dry eye syndrome, bilateral 05/18/2016   Embolic stroke involving right middle cerebral artery (HCC) 08/08/2004   Occured after traveling from [place]  to [place] where he was hospitalized for 1 month with no further work-up or therapy.  [place] and was admitted to [facility] immediately after landing. Presumed embolic per neuro but TEE, bubble study,  and hypercoag W/U negative. On indefinite coumadin  per patient's informed preference.  Residual effects : Left spastic hemiplegia. Tx with phenol tibi   Generalized anxiety disorder 08/03/2018   History of kidney stones    Hyperplastic colon polyp 07/27/2012   Excised endoscopically 07/27/2012   Hypertensive retinopathy of both eyes 05/18/2016   Internal and external hemorrhoids without complication 12/19/2017   Seen on colonoscopy 04/03/2007.   Left nephrolithiasis 08/31/2015   With mild left hydronephrosis.  Scheduled to undergo shockwave lithotripsy.   Obesity (BMI 30.0-34.9) 12/07/2013   Osteoarthritis of both knees 01/26/2012   Positive PPD 12/07/2013   Pulmonary embolism (HCC) 09/30/2004   Diagnosed in April 2006 after returning from [place].  Likely provoked as it occurred after a 12 hour plane flight within 2 months of R MCA CVA with left hemiparesis. Patient has made an informed decision to remain on lifelong warfarin.    Tubular adenoma 12/19/2017   Endoscopically excised 04/03/2007.   Urge incontinence of urine 08/31/2015   Possibly secondary to prior CVA.  Treated with Myrbetriq .   Social History   Socioeconomic History   Marital status: Single    Spouse name: Not on file   Number of children: 3   Years of education: 16   Highest education level: Not on file  Occupational History   Occupation: Retired  Tobacco Use   Smoking status: Former    Current packs/day: 0.00    Types: Cigarettes    Quit date: 06/28/2007    Years since quitting: 16.5   Smokeless tobacco: Never  Vaping Use   Vaping status: Never Used  Substance and Sexual Activity   Alcohol use: Yes    Comment: Rarely.   Drug use: No   Sexual activity: Not on file  Other Topics Concern   Not on file  Social History Narrative   Born in [place] but has been in the [place]  for > [place] years.  Divorced, 1 daughter and 2 sons.  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    Right handed   Coffee/tea sometimes, soda rarely   Social Drivers of Health   Financial Resource Strain: Low Risk  (08/21/2023)   Overall Financial Resource Strain (CARDIA)    Difficulty of Paying Living Expenses: Not hard at all  Food Insecurity: No Food Insecurity (08/21/2023)   Hunger Vital Sign    Worried About Running Out of Food in the Last Year: Never true    Ran Out of Food in the Last Year: Never true  Transportation Needs: No Transportation Needs (08/21/2023)   PRAPARE - Administrator, Civil Service (Medical): No    Lack of Transportation (Non-Medical): No  Physical Activity: Inactive (01/17/2019)   Exercise Vital Sign  Days of Exercise per Week: 0 days    Minutes of Exercise per Session: 0 min  Stress: No Stress Concern Present (08/21/2023)   [employer] of Occupational Health - Occupational Stress Questionnaire    Feeling of Stress : Not at all  Social Connections: Moderately Isolated (08/21/2023)   Social Connection and Isolation Panel    Frequency of Communication with Friends and Family: More than three times a week    Frequency of Social Gatherings with Friends and Family: More than three times a week    Attends Religious Services: More than 4 times per year    Active Member of [employer] or Organizations: No    Attends Engineer, structural: Never    Marital Status: Divorced   Family History  Problem Relation Age of Onset   Stroke Maternal Grandmother    Unexplained death Mother    Depression Mother        After husband's death   Unexplained death Father    Osteoarthritis Father        Knees   Early death Sister    Seizures Sister    Early death Brother 7       Unknown cause   Healthy Daughter    Healthy Son    Stroke Maternal Aunt    Healthy Sister    Healthy Sister    Unexplained death Brother    Healthy Brother    Healthy Son     ASSESSMENT Recent Results: The most recent result is correlated with 30  mg per week: Lab  Results  Component Value Date   INR 2.0 01/22/2024   INR 2.9 12/25/2023   INR 2.1 11/27/2023    Anticoagulation Dosing: Description   Take one (1) of your 5 mg strength, peach-colored warfarin tablets by mouth, once-daily every day--EXCEPT on WEDNESDAYS. Take only one-half (1/2) tablet on Wednesdays.      INR today: Therapeutic  PLAN Weekly dose was increased by 8% to 32.5 mg per week  Patient Instructions  Patient instructed to take medications as defined in the Anti-coagulation Track section of this encounter.  Patient instructed to take today's dose.  Patient instructed to take one (1) of your 5 mg strength, peach-colored warfarin tablets by mouth, once-daily every day--EXCEPT on WEDNESDAYS. Take only one-half (1/2) tablet on Wednesdays. Patient verbalized understanding of these instructions.  Patient advised to contact clinic or seek medical attention if signs/symptoms of bleeding or thromboembolism occur.  Patient verbalized understanding by repeating back information and was advised to contact me if further medication-related questions arise. Patient was also provided an information handout.  Follow-up Return in 4 weeks (on 02/19/2024) for Follow up INR.  Lynwood KATHEE Lites, PharmD, CPP Clinical Pharmacist Practitioner  15 minutes spent face-to-face with the patient during the encounter. 50% of time spent on education, including signs/sx bleeding and clotting, as well as food and drug interactions with warfarin. 50% of time was spent on fingerprick POC INR sample collection,processing, results determination, and documentation in [email or web address].

## 2024-01-22 NOTE — Patient Instructions (Signed)
 Patient instructed to take medications as defined in the Anti-coagulation Track section of this encounter.  Patient instructed to take today's dose.  Patient instructed to take one (1) of your 5 mg strength, peach-colored warfarin tablets by mouth, once-daily every day--EXCEPT on WEDNESDAYS. Take only one-half (1/2) tablet on Wednesdays. Patient verbalized understanding of these instructions.

## 2024-01-23 DIAGNOSIS — M5412 Radiculopathy, cervical region: Secondary | ICD-10-CM | POA: Diagnosis not present

## 2024-01-23 DIAGNOSIS — M25511 Pain in right shoulder: Secondary | ICD-10-CM | POA: Diagnosis not present

## 2024-02-12 ENCOUNTER — Ambulatory Visit (INDEPENDENT_AMBULATORY_CARE_PROVIDER_SITE_OTHER): Admitting: Pharmacist

## 2024-02-12 DIAGNOSIS — Z7901 Long term (current) use of anticoagulants: Secondary | ICD-10-CM

## 2024-02-12 DIAGNOSIS — Z86718 Personal history of other venous thrombosis and embolism: Secondary | ICD-10-CM

## 2024-02-12 LAB — POCT INR: INR: 1.6 — AB (ref 2.0–3.0)

## 2024-02-12 NOTE — Progress Notes (Signed)
 Anticoagulation Management Alexander English is a 68 y.o. male who reports to the clinic for monitoring of warfarin treatment.    Indication: DVT , history of; Long term current use of oral anticoagulation with warfarin, target INR range 2.0 - 3.0.  Duration: indefinite Supervising physician: Ronnald Sergeant, MD  Anticoagulation Clinic Visit History: Patient does not report signs/symptoms of bleeding or thromboembolism  Other recent changes: No diet, medications, lifestyle except as noted in patient findings.  Anticoagulation Episode Summary     Current INR goal:  2.0-3.0  TTR:  80.8% (11.9 y)  Next INR check:  03/11/2024  INR from last check:  1.6 (02/12/2024)  Weekly max warfarin dose:  --  Target end date:  Indefinite  INR check location:  Anticoagulation Clinic  Preferred lab:  --  Send INR reminders to:  [facility]   Indications   History of venous thromboembolism [Z86.718]        Comments:  --         No Known Allergies  Current Outpatient Medications:    fesoterodine  (TOVIAZ ) 8 MG TB24 tablet, Take 1 tablet (8 mg total) by mouth daily., Disp: 90 tablet, Rfl: 3   mirabegron  ER (MYRBETRIQ ) 50 MG TB24 tablet, Take 1 tablet (50 mg total) by mouth daily., Disp: 90 tablet, Rfl: 3   Multiple Vitamin (MULTIVITAMIN) tablet, Take 1 tablet by mouth daily., Disp: , Rfl:    propranolol  ER (INDERAL  LA) 60 MG 24 hr capsule, Take 1 capsule (60 mg total) by mouth daily., Disp: 90 capsule, Rfl: 3   tamsulosin (FLOMAX) 0.4 MG CAPS capsule, Take 0.4 mg by mouth daily., Disp: , Rfl:    tazarotene  (AVAGE ) 0.1 % cream, Apply topically at bedtime. To any area of acne., Disp: 30 g, Rfl: 0   zolpidem  (AMBIEN ) 5 MG tablet, Take 1 tablet (5 mg total) by mouth at bedtime as needed for sleep., Disp: 30 tablet, Rfl: 2   aspirin  EC 81 MG tablet, Take 81 mg by mouth daily., Disp: , Rfl:    atorvastatin  (LIPITOR) 10 MG tablet, Take 1 tablet (10 mg total) by mouth daily., Disp: 90 tablet, Rfl: 3    Baclofen  5 MG TABS, Take 1 tablet (5 mg total) by mouth 3 (three) times daily as needed (spasticity)., Disp: 90 tablet, Rfl: 2   benzoyl peroxide -erythromycin  (BENZAMYCIN) gel, Apply to affected area 2 times daily (Patient not taking: Reported on 02/12/2024), Disp: 47 g, Rfl: 1   ciclopirox  (PENLAC ) 8 % solution, Apply topically at bedtime. Apply over nail and surrounding skin. Apply daily over previous coat. After seven (7) days, may remove with alcohol and continue cycle. (Patient not taking: Reported on 02/12/2024), Disp: 6.6 mL, Rfl: 0   diclofenac  Sodium (VOLTAREN  ARTHRITIS PAIN) 1 % GEL, Apply 2 g topically 4 (four) times daily. (Patient not taking: Reported on 02/12/2024), Disp: 50 g, Rfl: 0   predniSONE  (DELTASONE ) 10 MG tablet, 6 tabs po day 1, 5 tabs po day 2, 4 tabs po day 3, 3 tabs po day 4, 2 tabs po day 5, 1 tab po day 6, Disp: 21 tablet, Rfl: 0   warfarin (COUMADIN ) 5 MG tablet, Take one-half (1/2) tablet on Mondays, Wednesdays and Fridays. All other days--Sundays, Tuesdays, Thursdays and Saturdays, take one (1) tablet. (Patient not taking: Reported on 02/12/2024), Disp: 72 tablet, Rfl: 1 Past Medical History:  Diagnosis Date   Aortic atherosclerosis (HCC) 10/23/2017   Asymptomatic, seen on CT scan   Bilateral cataracts 05/18/2016  Not visually significant   Cancer (HCC)    maxillary   Cerebrovascular accident (CVA) (HCC) 03/27/2020   Chronic constipation 06/12/2013   Chronic low back pain 06/12/2013   L4-5 facet arthropathy    Chronic pain of both shoulders 12/07/2013   A/C joint osteoarthritis    Cluster headaches    Resolved in 2006   COPD (chronic obstructive pulmonary disease) (HCC)    Dry eye syndrome, bilateral 05/18/2016   Embolic stroke involving right middle cerebral artery (HCC) 08/08/2004   Occured after traveling from [place]  to [place] where he was hospitalized for 1 month with no further work-up or therapy.  [place] and was admitted to [facility]  immediately after landing. Presumed embolic per neuro but TEE, bubble study, and hypercoag W/U negative. On indefinite coumadin  per patient's informed preference.  Residual effects : Left spastic hemiplegia. Tx with phenol tibi   Generalized anxiety disorder 08/03/2018   History of kidney stones    Hyperplastic colon polyp 07/27/2012   Excised endoscopically 07/27/2012   Hypertensive retinopathy of both eyes 05/18/2016   Internal and external hemorrhoids without complication 12/19/2017   Seen on colonoscopy 04/03/2007.   Left nephrolithiasis 08/31/2015   With mild left hydronephrosis.  Scheduled to undergo shockwave lithotripsy.   Obesity (BMI 30.0-34.9) 12/07/2013   Osteoarthritis of both knees 01/26/2012   Positive PPD 12/07/2013   Pulmonary embolism (HCC) 09/30/2004   Diagnosed in April 2006 after returning from [place].  Likely provoked as it occurred after a 12 hour plane flight within 2 months of R MCA CVA with left hemiparesis. Patient has made an informed decision to remain on lifelong warfarin.    Tubular adenoma 12/19/2017   Endoscopically excised 04/03/2007.   Urge incontinence of urine 08/31/2015   Possibly secondary to prior CVA.  Treated with Myrbetriq .   Social History   Socioeconomic History   Marital status: Single    Spouse name: Not on file   Number of children: 3   Years of education: 16   Highest education level: Not on file  Occupational History   Occupation: Retired  Tobacco Use   Smoking status: Former    Current packs/day: 0.00    Types: Cigarettes    Quit date: 06/28/2007    Years since quitting: 16.6   Smokeless tobacco: Never  Vaping Use   Vaping status: Never Used  Substance and Sexual Activity   Alcohol use: Yes    Comment: Rarely.   Drug use: No   Sexual activity: Not on file  Other Topics Concern   Not on file  Social History Narrative   Born in [place] but has been in the [place]  for > [place] years.  Divorced, 1 daughter and 2 sons.  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   Right handed   Coffee/tea sometimes, soda rarely   Social Drivers of Health   Financial Resource Strain: Low Risk  (08/21/2023)   Overall Financial Resource Strain (CARDIA)    Difficulty of Paying Living Expenses: Not hard at all  Food Insecurity: No Food Insecurity (08/21/2023)   Hunger Vital Sign    Worried About Running Out of Food in the Last Year: Never true    Ran Out of Food in the Last Year: Never true  Transportation Needs: No Transportation Needs (08/21/2023)   PRAPARE - Administrator, Civil Service (Medical): No    Lack of Transportation (Non-Medical): No  Physical Activity: Inactive (01/17/2019)   Exercise Vital Sign    Days of Exercise per Week: 0 days    Minutes of Exercise per Session: 0 min  Stress: No Stress Concern Present (08/21/2023)   [employer] of Occupational Health - Occupational Stress Questionnaire    Feeling of Stress : Not at all  Social Connections: Moderately Isolated (08/21/2023)   Social Connection and Isolation Panel    Frequency of Communication with Friends and Family: More than three times a week    Frequency of Social Gatherings with Friends and Family: More than three times a week    Attends Religious Services: More than 4 times per year    Active Member of [employer] or Organizations: No    Attends Engineer, structural: Never    Marital Status: Divorced   Family History  Problem Relation Age of Onset   Stroke Maternal Grandmother    Unexplained death Mother    Depression Mother        After husband's death   Unexplained death Father    Osteoarthritis Father        Knees   Early death Sister    Seizures Sister    Early death Brother 58       Unknown cause   Healthy Daughter    Healthy Son    Stroke Maternal Aunt    Healthy Sister    Healthy Sister    Unexplained death Brother    Healthy Brother    Healthy Son     ASSESSMENT Recent  Results: The most recent result is correlated with having HELD warfarin since 15-AUG-25 in preparation for planned podiatry procedure on 20-AUG-25, will resume on first post-operative day, Thursday, 21-AUG-25 using the following instructions:  Take one (1) of your 5 mg peach-colored warfarin tablets by mouth, once-daily--EXCEPT on [facility], take ONLY one-half (1/2) tablet on Wednesdays.  Lab Results  Component Value Date   INR 1.6 (A) 02/12/2024   INR 2.0 01/22/2024   INR 2.9 12/25/2023    Anticoagulation Dosing: Description   Take one (1) of your 5 mg strength, peach-colored warfarin tablets by mouth, once-daily every day--EXCEPT on WEDNESDAYS. Take only one-half (1/2) tablet on Wednesdays.      INR today: 1.6 with explanation 2/2 to having omitted doses 15-20 AUG as had been instructed.   PLAN Weekly dose will be RECOMMENCED on first post-operative day to 32.5 mg warfarin/wk.   Patient Instructions  Patient instructed to take medications as defined in the Anti-coagulation Track section of this encounter.  Patient instructed to CONTINUE TO OMIT DOSES through the 20th of August, 2025. On the first post-operative day, Thursday, August 21st, 2025, resume warfarin as per the instructions provided and documented.   Patient instructed to take warfarin as per instructions provided.  Patient verbalized understanding of these instructions.  Patient advised to contact clinic or seek medical attention if signs/symptoms of bleeding or thromboembolism occur.  Patient verbalized understanding by repeating back information and was advised to contact me if further medication-related questions arise. Patient was also provided an information handout.  Follow-up Return in 4 weeks (on 03/11/2024) for Follow up INR.  Alexander English, PharmD, CPP Clinical Pharmacist Practitioner  15 minutes spent face-to-face with the patient during the encounter. 50% of time spent on education, including signs/sx  bleeding and clotting, as well as food and drug interactions with warfarin. 50% of time was spent on fingerprick POC INR sample collection,processing, results determination, and documentation in [email or web address].

## 2024-02-12 NOTE — Patient Instructions (Signed)
 Patient instructed to take medications as defined in the Anti-coagulation Track section of this encounter.  Patient instructed to CONTINUE TO OMIT DOSES through the 20th of August, 2025. On the first post-operative day, Thursday, August 21st, 2025, resume warfarin as per the instructions provided and documented.   Patient instructed to take warfarin as per instructions provided.  Patient verbalized understanding of these instructions.

## 2024-02-14 ENCOUNTER — Ambulatory Visit: Admitting: Podiatry

## 2024-02-14 DIAGNOSIS — L603 Nail dystrophy: Secondary | ICD-10-CM

## 2024-02-14 NOTE — Progress Notes (Signed)
 Subjective:  Patient ID: Alexander English, male    DOB: 07/19/1955,  MRN: [id number]  Chief Complaint  Patient presents with   Nail Problem    Pt stated that he is here to have his nail removed     68 y.o. male presents with the above complaint.  Patient presents with left hallux nail dystrophy painful to touch is progressing and worse worse with ambulation is with pressure he would like to have it removed he is currently on Coumadin  but has stopped recently and his INR is currently at 1.5.  He would like to have it removed pain scale is 5 out of 10 dull aching nature would like to make a permanent   Review of Systems: Negative except as noted in the HPI. Denies N/V/F/Ch.  Past Medical History:  Diagnosis Date   Aortic atherosclerosis (HCC) 10/23/2017   Asymptomatic, seen on CT scan   Bilateral cataracts 05/18/2016   Not visually significant   Cancer (HCC)    maxillary   Cerebrovascular accident (CVA) (HCC) 03/27/2020   Chronic constipation 06/12/2013   Chronic low back pain 06/12/2013   L4-5 facet arthropathy    Chronic pain of both shoulders 12/07/2013   A/C joint osteoarthritis    Cluster headaches    Resolved in 2006   COPD (chronic obstructive pulmonary disease) (HCC)    Dry eye syndrome, bilateral 05/18/2016   Embolic stroke involving right middle cerebral artery (HCC) 08/08/2004   Occured after traveling from [place]  to [place] where he was hospitalized for 1 month with no further work-up or therapy.  [place] and was admitted to [facility] immediately after landing. Presumed embolic per neuro but TEE, bubble study, and hypercoag W/U negative. On indefinite coumadin  per patient's informed preference.  Residual effects : Left spastic hemiplegia. Tx with phenol tibi   Generalized anxiety disorder 08/03/2018   History of kidney stones    Hyperplastic colon polyp 07/27/2012   Excised endoscopically 07/27/2012   Hypertensive retinopathy of both eyes 05/18/2016    Internal and external hemorrhoids without complication 12/19/2017   Seen on colonoscopy 04/03/2007.   Left nephrolithiasis 08/31/2015   With mild left hydronephrosis.  Scheduled to undergo shockwave lithotripsy.   Obesity (BMI 30.0-34.9) 12/07/2013   Osteoarthritis of both knees 01/26/2012   Positive PPD 12/07/2013   Pulmonary embolism (HCC) 09/30/2004   Diagnosed in April 2006 after returning from [place].  Likely provoked as it occurred after a 12 hour plane flight within 2 months of R MCA CVA with left hemiparesis. Patient has made an informed decision to remain on lifelong warfarin.    Tubular adenoma 12/19/2017   Endoscopically excised 04/03/2007.   Urge incontinence of urine 08/31/2015   Possibly secondary to prior CVA.  Treated with Myrbetriq .    Current Outpatient Medications:    aspirin  EC 81 MG tablet, Take 81 mg by mouth daily., Disp: , Rfl:    atorvastatin  (LIPITOR) 10 MG tablet, Take 1 tablet (10 mg total) by mouth daily., Disp: 90 tablet, Rfl: 3   Baclofen  5 MG TABS, Take 1 tablet (5 mg total) by mouth 3 (three) times daily as needed (spasticity)., Disp: 90 tablet, Rfl: 2   benzoyl peroxide -erythromycin  (BENZAMYCIN) gel, Apply to affected area 2 times daily (Patient not taking: Reported on 02/12/2024), Disp: 47 g, Rfl: 1   ciclopirox  (PENLAC ) 8 % solution, Apply topically at bedtime. Apply over nail and surrounding skin. Apply daily over previous coat. After seven (7) days, may remove  with alcohol and continue cycle. (Patient not taking: Reported on 02/12/2024), Disp: 6.6 mL, Rfl: 0   diclofenac  Sodium (VOLTAREN  ARTHRITIS PAIN) 1 % GEL, Apply 2 g topically 4 (four) times daily. (Patient not taking: Reported on 02/12/2024), Disp: 50 g, Rfl: 0   fesoterodine  (TOVIAZ ) 8 MG TB24 tablet, Take 1 tablet (8 mg total) by mouth daily., Disp: 90 tablet, Rfl: 3   mirabegron  ER (MYRBETRIQ ) 50 MG TB24 tablet, Take 1 tablet (50 mg total) by mouth daily., Disp: 90 tablet, Rfl: 3   Multiple Vitamin  (MULTIVITAMIN) tablet, Take 1 tablet by mouth daily., Disp: , Rfl:    predniSONE  (DELTASONE ) 10 MG tablet, 6 tabs po day 1, 5 tabs po day 2, 4 tabs po day 3, 3 tabs po day 4, 2 tabs po day 5, 1 tab po day 6, Disp: 21 tablet, Rfl: 0   propranolol  ER (INDERAL  LA) 60 MG 24 hr capsule, Take 1 capsule (60 mg total) by mouth daily., Disp: 90 capsule, Rfl: 3   tamsulosin (FLOMAX) 0.4 MG CAPS capsule, Take 0.4 mg by mouth daily., Disp: , Rfl:    tazarotene  (AVAGE ) 0.1 % cream, Apply topically at bedtime. To any area of acne., Disp: 30 g, Rfl: 0   warfarin (COUMADIN ) 5 MG tablet, Take one-half (1/2) tablet on Mondays, Wednesdays and Fridays. All other days--Sundays, Tuesdays, Thursdays and Saturdays, take one (1) tablet. (Patient not taking: Reported on 02/12/2024), Disp: 72 tablet, Rfl: 1   zolpidem  (AMBIEN ) 5 MG tablet, Take 1 tablet (5 mg total) by mouth at bedtime as needed for sleep., Disp: 30 tablet, Rfl: 2  Social History   Tobacco Use  Smoking Status Former   Current packs/day: 0.00   Types: Cigarettes   Quit date: 06/28/2007   Years since quitting: 16.6  Smokeless Tobacco Never    No Known Allergies Objective:  There were no vitals filed for this visit. There is no height or weight on file to calculate BMI. Constitutional Well developed. Well nourished.  Vascular Dorsalis pedis pulses palpable bilaterally. Posterior tibial pulses palpable bilaterally. Capillary refill normal to all digits.  No cyanosis or clubbing noted. Pedal hair growth normal.  Neurologic Normal speech. Oriented to person, place, and time. Epicritic sensation to light touch grossly present bilaterally.  Dermatologic Pain on palpation of the entire/total nail on 1st digit of the left No other open wounds. No skin lesions.  Orthopedic: Normal joint ROM without pain or crepitus bilaterally. No visible deformities. No bony tenderness.   Radiographs: None Assessment:  No diagnosis found. Plan:  Patient was  evaluated and treated and all questions answered.  Nail contusion/dystrophy hallux, left -Patient elects to proceed with minor surgery to remove entire toenail today. Consent reviewed and signed by patient. -Entire/total nail excised. See procedure note. -Educated on post-procedure care including soaking. Written instructions provided and reviewed. -Patient to follow up in 2 weeks for nail check.  Procedure: Excision of entire/total nail  Location: Left 1st toe digit Anesthesia: Lidocaine  1% plain; 1.5 mL and Marcaine 0.5% plain; 1.5 mL, digital block. Skin Prep: Betadine. Dressing: Silvadene; telfa; dry, sterile, compression dressing. Technique: Following skin prep, the toe was exsanguinated and a tourniquet was secured at the base of the toe. The affected nail border was freed and excised. The tourniquet was then removed and sterile dressing applied. Disposition: Patient tolerated procedure well. Patient to return in 2 weeks for follow-up.   No follow-ups on file.

## 2024-02-14 NOTE — Patient Instructions (Signed)

## 2024-02-23 ENCOUNTER — Telehealth: Payer: Self-pay | Admitting: *Deleted

## 2024-02-23 NOTE — Telephone Encounter (Signed)
 Will refer to PCP. Copied from CRM [id number]. Topic: Clinical - Lab/Test Results >> Feb 23, 2024 11:01 AM Laurier BROCKS wrote: Caller: Melene from [facility] [phone] Reason for CRM: Called to confirm if the report for the patient's colonoscopy was recv'd back on 10/20/23?  Melene states they recently recv'd another referral on 02/19/2024 for the same service. Melene states the patient would not be due for another colonoscopy until 09/2028. She will be resending the report via fax (fax # confirmed).

## 2024-02-27 NOTE — Telephone Encounter (Signed)
 Looks like I placed the referral back in Feb, we do have results from April colonoscopy. Will send over to chilon to review what happened here.

## 2024-03-11 ENCOUNTER — Ambulatory Visit (INDEPENDENT_AMBULATORY_CARE_PROVIDER_SITE_OTHER): Admitting: Pharmacist

## 2024-03-11 DIAGNOSIS — Z86718 Personal history of other venous thrombosis and embolism: Secondary | ICD-10-CM

## 2024-03-11 DIAGNOSIS — Z86711 Personal history of pulmonary embolism: Secondary | ICD-10-CM

## 2024-03-11 DIAGNOSIS — Z7901 Long term (current) use of anticoagulants: Secondary | ICD-10-CM | POA: Diagnosis not present

## 2024-03-11 DIAGNOSIS — Z23 Encounter for immunization: Secondary | ICD-10-CM

## 2024-03-11 DIAGNOSIS — I639 Cerebral infarction, unspecified: Secondary | ICD-10-CM | POA: Diagnosis not present

## 2024-03-11 LAB — POCT INR: INR: 3 (ref 2.0–3.0)

## 2024-03-11 NOTE — Progress Notes (Signed)
 Anticoagulation Management Alexander English is a 68 y.o. male who reports to the clinic for monitoring of warfarin treatment.    Indication: DVT , History of; Long term current use of oral anticoagulant,  warfarin, with target INR range 2.0 - 3.0.  Duration: indefinite Supervising physician: Mliss Foot, MD  Anticoagulation Clinic Visit History: Patient does not report signs/symptoms of bleeding or thromboembolism  Other recent changes: No diet, medications, lifestyle changes endorsed by the patient at this visit.  Anticoagulation Episode Summary     Current INR goal:  2.0-3.0  TTR:  80.7% (12 y)  Next INR check:  04/08/2024  INR from last check:  3.0 (03/11/2024)  Weekly max warfarin dose:  --  Target end date:  Indefinite  INR check location:  Anticoagulation Clinic  Preferred lab:  --  Send INR reminders to:  [facility]   Indications   History of venous thromboembolism [Z86.718]        Comments:  --         No Known Allergies  Current Outpatient Medications:    aspirin  EC 81 MG tablet, Take 81 mg by mouth daily., Disp: , Rfl:    atorvastatin  (LIPITOR) 10 MG tablet, Take 1 tablet (10 mg total) by mouth daily., Disp: 90 tablet, Rfl: 3   Baclofen  5 MG TABS, Take 1 tablet (5 mg total) by mouth 3 (three) times daily as needed (spasticity)., Disp: 90 tablet, Rfl: 2   fesoterodine  (TOVIAZ ) 8 MG TB24 tablet, Take 1 tablet (8 mg total) by mouth daily., Disp: 90 tablet, Rfl: 3   mirabegron  ER (MYRBETRIQ ) 50 MG TB24 tablet, Take 1 tablet (50 mg total) by mouth daily., Disp: 90 tablet, Rfl: 3   Multiple Vitamin (MULTIVITAMIN) tablet, Take 1 tablet by mouth daily., Disp: , Rfl:    propranolol  ER (INDERAL  LA) 60 MG 24 hr capsule, Take 1 capsule (60 mg total) by mouth daily., Disp: 90 capsule, Rfl: 3   tamsulosin (FLOMAX) 0.4 MG CAPS capsule, Take 0.4 mg by mouth daily., Disp: , Rfl:    warfarin (COUMADIN ) 5 MG tablet, Take one-half (1/2) tablet on Mondays, Wednesdays and  Fridays. All other days--Sundays, Tuesdays, Thursdays and Saturdays, take one (1) tablet., Disp: 72 tablet, Rfl: 1   zolpidem  (AMBIEN ) 5 MG tablet, Take 1 tablet (5 mg total) by mouth at bedtime as needed for sleep., Disp: 30 tablet, Rfl: 2   benzoyl peroxide -erythromycin  (BENZAMYCIN) gel, Apply to affected area 2 times daily (Patient not taking: Reported on 03/11/2024), Disp: 47 g, Rfl: 1   ciclopirox  (PENLAC ) 8 % solution, Apply topically at bedtime. Apply over nail and surrounding skin. Apply daily over previous coat. After seven (7) days, may remove with alcohol and continue cycle. (Patient not taking: Reported on 03/11/2024), Disp: 6.6 mL, Rfl: 0   diclofenac  Sodium (VOLTAREN  ARTHRITIS PAIN) 1 % GEL, Apply 2 g topically 4 (four) times daily. (Patient not taking: Reported on 03/11/2024), Disp: 50 g, Rfl: 0   predniSONE  (DELTASONE ) 10 MG tablet, 6 tabs po day 1, 5 tabs po day 2, 4 tabs po day 3, 3 tabs po day 4, 2 tabs po day 5, 1 tab po day 6 (Patient not taking: Reported on 03/11/2024), Disp: 21 tablet, Rfl: 0   tazarotene  (AVAGE ) 0.1 % cream, Apply topically at bedtime. To any area of acne. (Patient not taking: Reported on 03/11/2024), Disp: 30 g, Rfl: 0 Past Medical History:  Diagnosis Date   Aortic atherosclerosis (HCC) 10/23/2017   Asymptomatic, seen on  CT scan   Bilateral cataracts 05/18/2016   Not visually significant   Cancer (HCC)    maxillary   Cerebrovascular accident (CVA) (HCC) 03/27/2020   Chronic constipation 06/12/2013   Chronic low back pain 06/12/2013   L4-5 facet arthropathy    Chronic pain of both shoulders 12/07/2013   A/C joint osteoarthritis    Cluster headaches    Resolved in 2006   COPD (chronic obstructive pulmonary disease) (HCC)    Dry eye syndrome, bilateral 05/18/2016   Embolic stroke involving right middle cerebral artery (HCC) 08/08/2004   Occured after traveling from [place]  to [place] where he was hospitalized for 1 month with no further work-up or therapy.   [place] and was admitted to [facility] immediately after landing. Presumed embolic per neuro but TEE, bubble study, and hypercoag W/U negative. On indefinite coumadin  per patient's informed preference.  Residual effects : Left spastic hemiplegia. Tx with phenol tibi   Generalized anxiety disorder 08/03/2018   History of kidney stones    Hyperplastic colon polyp 07/27/2012   Excised endoscopically 07/27/2012   Hypertensive retinopathy of both eyes 05/18/2016   Internal and external hemorrhoids without complication 12/19/2017   Seen on colonoscopy 04/03/2007.   Left nephrolithiasis 08/31/2015   With mild left hydronephrosis.  Scheduled to undergo shockwave lithotripsy.   Obesity (BMI 30.0-34.9) 12/07/2013   Osteoarthritis of both knees 01/26/2012   Positive PPD 12/07/2013   Pulmonary embolism (HCC) 09/30/2004   Diagnosed in April 2006 after returning from [place].  Likely provoked as it occurred after a 12 hour plane flight within 2 months of R MCA CVA with left hemiparesis. Patient has made an informed decision to remain on lifelong warfarin.    Tubular adenoma 12/19/2017   Endoscopically excised 04/03/2007.   Urge incontinence of urine 08/31/2015   Possibly secondary to prior CVA.  Treated with Myrbetriq .   Social History   Socioeconomic History   Marital status: Single    Spouse name: Not on file   Number of children: 3   Years of education: 16   Highest education level: Not on file  Occupational History   Occupation: Retired  Tobacco Use   Smoking status: Former    Current packs/day: 0.00    Types: Cigarettes    Quit date: 06/28/2007    Years since quitting: 16.7   Smokeless tobacco: Never  Vaping Use   Vaping status: Never Used  Substance and Sexual Activity   Alcohol use: Yes    Comment: Rarely.   Drug use: No   Sexual activity: Not on file  Other Topics Concern   Not on file  Social History Narrative   Born in [place] but has been in the [place]  for > [place]  years.  Divorced, 1 daughter and 2 sons.  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   Right handed   Coffee/tea sometimes, soda rarely   Social Drivers of Health   Financial Resource Strain: Low Risk  (08/21/2023)   Overall Financial Resource Strain (CARDIA)    Difficulty of Paying Living Expenses: Not hard at all  Food Insecurity: No Food Insecurity (08/21/2023)   Hunger Vital Sign    Worried About Running Out of Food in the Last Year: Never true    Ran Out of Food in the Last Year: Never true  Transportation Needs: No Transportation Needs (08/21/2023)   PRAPARE - Administrator, Civil Service (Medical):  No    Lack of Transportation (Non-Medical): No  Physical Activity: Inactive (01/17/2019)   Exercise Vital Sign    Days of Exercise per Week: 0 days    Minutes of Exercise per Session: 0 min  Stress: No Stress Concern Present (08/21/2023)   [employer] of Occupational Health - Occupational Stress Questionnaire    Feeling of Stress : Not at all  Social Connections: Moderately Isolated (08/21/2023)   Social Connection and Isolation Panel    Frequency of Communication with Friends and Family: More than three times a week    Frequency of Social Gatherings with Friends and Family: More than three times a week    Attends Religious Services: More than 4 times per year    Active Member of [employer] or Organizations: No    Attends Engineer, structural: Never    Marital Status: Divorced   Family History  Problem Relation Age of Onset   Stroke Maternal Grandmother    Unexplained death Mother    Depression Mother        After husband's death   Unexplained death Father    Osteoarthritis Father        Knees   Early death Sister    Seizures Sister    Early death Brother 45       Unknown cause   Healthy Daughter    Healthy Son    Stroke Maternal Aunt    Healthy Sister    Healthy Sister    Unexplained death Brother     Healthy Brother    Healthy Son     ASSESSMENT Recent Results: The most recent result is correlated with 30 mg per week: Lab Results  Component Value Date   INR 3.0 03/11/2024   INR 1.6 (A) 02/12/2024   INR 2.0 01/22/2024    Anticoagulation Dosing: Description   Take one (1) of your 5 mg strength, peach-colored warfarin tablets by mouth, once-daily every day--EXCEPT on MONDAYS and THURSDAYS, take only one-half (1/2) tablet on MONDAYS and THURSDAYS.      INR today: Therapeutic  PLAN Weekly dose was unchanged.   Patient Instructions  Patient instructed to take medications as defined in the Anti-coagulation Track section of this encounter.  Patient instructed to take today's dose.  Patient instructed to take one (1) of your 5 mg strength, peach-colored warfarin tablets by mouth, once-daily every day--EXCEPT on MONDAYS and THURSDAYS, take only one-half (1/2) tablet on MONDAYS and THURSDAYS.  Patient verbalized understanding of these instructions.  Patient advised to contact clinic or seek medical attention if signs/symptoms of bleeding or thromboembolism occur.  Patient verbalized understanding by repeating back information and was advised to contact me if further medication-related questions arise. Patient was also provided an information handout.  Follow-up Return in 4 weeks (on 04/08/2024) for Follow up INR.  Lynwood KATHEE Lites, PharmD, CPP Clinical Pharmacist Practitioner  15 minutes spent face-to-face with the patient during the encounter. 50% of time spent on education, including signs/sx bleeding and clotting, as well as food and drug interactions with warfarin. 50% of time was spent on fingerprick POC INR sample collection,processing, results determination, and documentation in [email or web address].

## 2024-03-11 NOTE — Patient Instructions (Signed)
 Patient instructed to take medications as defined in the Anti-coagulation Track section of this encounter.  Patient instructed to take today's dose.  Patient instructed to take one (1) of your 5 mg strength, peach-colored warfarin tablets by mouth, once-daily every day--EXCEPT on MONDAYS and THURSDAYS, take only one-half (1/2) tablet on MONDAYS and THURSDAYS.  Patient verbalized understanding of these instructions.

## 2024-03-12 NOTE — Progress Notes (Signed)
Evaluation and management procedures were performed by the Clinical Pharmacy Practitioner under my supervision and collaboration. I have reviewed the Practitioner's note and chart, and I agree with the management and plan as documented above. ° °

## 2024-03-19 DIAGNOSIS — N5201 Erectile dysfunction due to arterial insufficiency: Secondary | ICD-10-CM | POA: Diagnosis not present

## 2024-03-19 DIAGNOSIS — R3915 Urgency of urination: Secondary | ICD-10-CM | POA: Diagnosis not present

## 2024-03-19 DIAGNOSIS — Z125 Encounter for screening for malignant neoplasm of prostate: Secondary | ICD-10-CM | POA: Diagnosis not present

## 2024-03-20 ENCOUNTER — Ambulatory Visit: Admitting: Podiatry

## 2024-03-20 DIAGNOSIS — L603 Nail dystrophy: Secondary | ICD-10-CM

## 2024-03-20 NOTE — Progress Notes (Signed)
 Subjective: Alexander English is a 68 y.o.  male returns to office today for follow up evaluation after having left 1st total nail avulsion performed. Patient has been soaking using epsom salt and applying topical antibiotic covered with bandaid daily. Patient denies fevers, chills, nausea, vomiting. Denies any calf pain, chest pain, SOB.   Objective:  Vitals: Reviewed  General: Well developed, nourished, in no acute distress, alert and oriented x3   Dermatology: Skin is warm, dry and supple bilateral. left 1st nail bed appears to be clean, dry, with mild granular tissue and surrounding scab. There is no surrounding erythema, edema, drainage/purulence. The remaining nails appear unremarkable at this time. There are no other lesions or other signs of infection present.  Neurovascular status: Intact. No lower extremity swelling; No pain with calf compression bilateral.  Musculoskeletal: Decreased tenderness to palpation of the left 1st nail bed. Muscular strength within normal limits bilateral.   Assesement and Plan: S/p total nail avulsion, doing well.   -disContinue soaking in epsom salts twice a day followed by antibiotic ointment and a band-aid. Can leave uncovered at night. Continue this until completely healed.  -If the area has not healed in 2 weeks, call the office for follow-up appointment, or sooner if any problems arise.  -Monitor for any signs/symptoms of infection. Call the office immediately if any occur or go directly to the emergency room. Call with any questions/concerns.  Nikolaj Geraghty, DPM

## 2024-04-03 ENCOUNTER — Ambulatory Visit

## 2024-04-03 ENCOUNTER — Telehealth: Payer: Self-pay

## 2024-04-03 VITALS — Wt 225.8 lb

## 2024-04-03 DIAGNOSIS — Z0000 Encounter for general adult medical examination without abnormal findings: Secondary | ICD-10-CM | POA: Diagnosis not present

## 2024-04-03 NOTE — Progress Notes (Signed)
 Because this visit was a virtual/telehealth visit,  certain criteria was not obtained, such a blood pressure, CBG if applicable, and timed get up and go. Any medications not marked as taking were not mentioned during the medication reconciliation part of the visit. Any vitals not documented were not able to be obtained due to this being a telehealth visit or patient was unable to self-report a recent blood pressure reading due to a lack of equipment at home via telehealth. Vitals that have been documented are verbally provided by the patient.   Subjective:   Alexander English is a 68 y.o. who presents for a Medicare Wellness preventive visit.  As a reminder, Annual Wellness Visits don't include a physical exam, and some assessments may be limited, especially if this visit is performed virtually. We may recommend an in-person follow-up visit with your provider if needed.  Visit Complete: Virtual I connected with  Alexander English on 04/03/24 by a audio enabled telemedicine application and verified that I am speaking with the correct person using two identifiers.  Patient Location: Home  Provider Location: Office/Clinic  I discussed the limitations of evaluation and management by telemedicine. The patient expressed understanding and agreed to proceed.  Vital Signs: Because this visit was a virtual/telehealth visit, some criteria may be missing or patient reported. Any vitals not documented were not able to be obtained and vitals that have been documented are patient reported.  VideoDeclined- This patient declined Librarian, academic. Therefore the visit was completed with audio only.  Persons Participating in Visit: Patient.  AWV Questionnaire: No: Patient Medicare AWV questionnaire was not completed prior to this visit.  Cardiac Risk Factors include: advanced age (>61men, >76 women);sedentary lifestyle;family history of premature cardiovascular  disease;hypertension;male gender;obesity (BMI >30kg/m2)     Objective:    Today's Vitals   04/03/24 1550 04/03/24 1552  Weight: 225 lb 12.8 oz (102.4 kg)   PainSc: 3  3   PainLoc: Generalized    Body mass index is 30.62 kg/m.     04/03/2024    3:53 PM 12/25/2023   12:07 PM 08/21/2023   11:34 AM 08/21/2023   10:58 AM 07/26/2023    2:53 PM 08/15/2022    3:02 PM 08/15/2022    9:17 AM  Advanced Directives  Does Patient Have a Medical Advance Directive? Yes No No No No Yes Yes  Type of Estate agent of [place];Living will     Healthcare Power of [place];Living will Healthcare Power of [place];Living will  Does patient want to make changes to medical advance directive?       No - Patient declined  Copy of Healthcare Power of Attorney in Chart? No - copy requested     No - copy requested No - copy requested  Would patient like information on creating a medical advance directive?  No - Patient declined No - Patient declined No - Patient declined No - Patient declined      Current Medications (verified) Outpatient Encounter Medications as of 04/03/2024  Medication Sig   aspirin  EC 81 MG tablet Take 81 mg by mouth daily.   atorvastatin  (LIPITOR) 10 MG tablet Take 1 tablet (10 mg total) by mouth daily.   Baclofen  5 MG TABS Take 1 tablet (5 mg total) by mouth 3 (three) times daily as needed (spasticity).   benzoyl peroxide -erythromycin  (BENZAMYCIN) gel Apply to affected area 2 times daily (Patient not taking: Reported on 03/11/2024)   ciclopirox  (PENLAC ) 8 % solution  Apply topically at bedtime. Apply over nail and surrounding skin. Apply daily over previous coat. After seven (7) days, may remove with alcohol and continue cycle. (Patient not taking: Reported on 03/11/2024)   diclofenac  Sodium (VOLTAREN  ARTHRITIS PAIN) 1 % GEL Apply 2 g topically 4 (four) times daily. (Patient not taking: Reported on 03/11/2024)   fesoterodine  (TOVIAZ ) 8 MG TB24 tablet Take 1 tablet (8 mg  total) by mouth daily.   mirabegron  ER (MYRBETRIQ ) 50 MG TB24 tablet Take 1 tablet (50 mg total) by mouth daily.   Multiple Vitamin (MULTIVITAMIN) tablet Take 1 tablet by mouth daily.   predniSONE  (DELTASONE ) 10 MG tablet 6 tabs po day 1, 5 tabs po day 2, 4 tabs po day 3, 3 tabs po day 4, 2 tabs po day 5, 1 tab po day 6 (Patient not taking: Reported on 03/11/2024)   propranolol  ER (INDERAL  LA) 60 MG 24 hr capsule Take 1 capsule (60 mg total) by mouth daily.   tamsulosin (FLOMAX) 0.4 MG CAPS capsule Take 0.4 mg by mouth daily.   tazarotene  (AVAGE ) 0.1 % cream Apply topically at bedtime. To any area of acne. (Patient not taking: Reported on 03/11/2024)   warfarin (COUMADIN ) 5 MG tablet Take one-half (1/2) tablet on Mondays, Wednesdays and Fridays. All other days--Sundays, Tuesdays, Thursdays and Saturdays, take one (1) tablet.   zolpidem  (AMBIEN ) 5 MG tablet Take 1 tablet (5 mg total) by mouth at bedtime as needed for sleep.   No facility-administered encounter medications on file as of 04/03/2024.    Allergies (verified) Patient has no known allergies.   History: Past Medical History:  Diagnosis Date   Aortic atherosclerosis 10/23/2017   Asymptomatic, seen on CT scan   Bilateral cataracts 05/18/2016   Not visually significant   Cancer (HCC)    maxillary   Cerebrovascular accident (CVA) (HCC) 03/27/2020   Chronic constipation 06/12/2013   Chronic low back pain 06/12/2013   L4-5 facet arthropathy    Chronic pain of both shoulders 12/07/2013   A/C joint osteoarthritis    Cluster headaches    Resolved in 2006   COPD (chronic obstructive pulmonary disease) (HCC)    Dry eye syndrome, bilateral 05/18/2016   Embolic stroke involving right middle cerebral artery (HCC) 08/08/2004   Occured after traveling from [place]  to [place] where he was hospitalized for 1 month with no further work-up or therapy.  [place]  un[place] to walk and was admitted to [facility] immediately after landing. Presumed embolic  per neuro but TEE, bubble study, and hypercoag W/U negative. On indefinite coumadin  per patient's informed preference.  Residual effects : Left spastic hemiplegia. Tx with phenol tibi   Generalized anxiety disorder 08/03/2018   History of kidney stones    Hyperplastic colon polyp 07/27/2012   Excised endoscopically 07/27/2012   Hypertensive retinopathy of both eyes 05/18/2016   Internal and external hemorrhoids without complication 12/19/2017   Seen on colonoscopy 04/03/2007.   Left nephrolithiasis 08/31/2015   With mild left hydronephrosis.  Scheduled to undergo shockwave lithotripsy.   Obesity (BMI 30.0-34.9) 12/07/2013   Osteoarthritis of both knees 01/26/2012   Positive PPD 12/07/2013   Pulmonary embolism (HCC) 09/30/2004   Diagnosed in April 2006 after returning from [place].  Likely provoked as it occurred after a 12 hour plane flight within 2 months of R MCA CVA with left hemiparesis. Patient has made an informed decision to remain on lifelong warfarin.    Tubular adenoma 12/19/2017   Endoscopically excised 04/03/2007.   Urge  incontinence of urine 08/31/2015   Possibly secondary to prior CVA.  Treated with Myrbetriq .   Past Surgical History:  Procedure Laterality Date   CYSTOSCOPY W/ RETROGRADES Right 02/22/2021   Procedure: CYSTOSCOPY WITH RETROGRADE PYELOGRAM/ URETEROSCOPY/ POSSIBLE BIOPSY OF LESION, POSSIBLE RIGHT STENT PLACEMENT;  Surgeon: Selma [name] SAUNDERS, MD;  Location: [facility] ORS;  Service: Urology;  Laterality: Right;  ONLY NEEDS 30 MIN   EXCISION NASAL MASS Left 10/06/2017   Procedure: EXCISION LEFT NASAL MASS;  Surgeon: Jesus Oliphant, MD;  Location: [facility] OR;  Service: ENT;  Laterality: Left;  Removal of intvering papilloma frozen section   FOOT SURGERY Bilateral    Bunion   I & D EXTREMITY Left 11/28/2001   Left thumb thenar space abscess.   LITHOTRIPSY  2017   MAXILLECTOMY Left 11/22/2017   thelbert 11/22/2017   MAXILLECTOMY Left 11/22/2017   Procedure: MAXILLECTOMY;  Surgeon:  Jesus Oliphant, MD;  Location: [facility] OR;  Service: ENT;  Laterality: Left;   NASAL SINUS SURGERY Left 10/06/2017   Procedure: ENDOSCOPIC SINUS SURGERY;  Surgeon: Jesus Oliphant, MD;  Location: [facility] OR;  Service: ENT;  Laterality: Left;  Left side Endoscopic Macillary and Ethmoid Sinus   SKIN SPLIT GRAFT Left 11/22/2017   Procedure: SKIN GRAFT SPLIT THICKNESS;  Surgeon: Jesus Oliphant, MD;  Location: [facility] OR;  Service: ENT;  Laterality: Left;   Family History  Problem Relation Age of Onset   Stroke Maternal Grandmother    Unexplained death Mother    Depression Mother        After husband's death   Unexplained death Father    Osteoarthritis Father        Knees   Early death Sister    Seizures Sister    Early death Brother 2       Unknown cause   Healthy Daughter    Healthy Son    Stroke Maternal Aunt    Healthy Sister    Healthy Sister    Unexplained death Brother    Healthy Brother    Healthy Son    Social History   Socioeconomic History   Marital status: Single    Spouse name: Not on file   Number of children: 3   Years of education: 16   Highest education level: Not on file  Occupational History   Occupation: Retired  Tobacco Use   Smoking status: Former    Current packs/day: 0.00    Types: Cigarettes    Quit date: 06/28/2007    Years since quitting: 16.7   Smokeless tobacco: Never  Vaping Use   Vaping status: Never Used  Substance and Sexual Activity   Alcohol use: Yes    Comment: Rarely.   Drug use: No   Sexual activity: Not on file  Other Topics Concern   Not on file  Social History Narrative   Born in [place] but has been in the [place]  for > [place] years.  Divorced, 1 daughter and 2 sons.  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   Right handed   Coffee/tea sometimes, soda rarely   Social Drivers of Corporate investment banker Strain: Low Risk  (04/03/2024)   Overall Financial Resource Strain (CARDIA)    Difficulty of Paying  Living Expenses: Not hard at all  Food Insecurity: No Food Insecurity (04/03/2024)   Hunger Vital Sign    Worried About Running Out of Food in the Last Year: Never true  Ran Out of Food in the Last Year: Never true  Transportation Needs: No Transportation Needs (04/03/2024)   PRAPARE - Administrator, Civil Service (Medical): No    Lack of Transportation (Non-Medical): No  Physical Activity: Inactive (04/03/2024)   Exercise Vital Sign    Days of Exercise per Week: 0 days    Minutes of Exercise per Session: 0 min  Stress: No Stress Concern Present (04/03/2024)   [employer] of Occupational Health - Occupational Stress Questionnaire    Feeling of Stress: Not at all  Social Connections: Moderately Isolated (04/03/2024)   Social Connection and Isolation Panel    Frequency of Communication with Friends and Family: More than three times a week    Frequency of Social Gatherings with Friends and Family: More than three times a week    Attends Religious Services: More than 4 times per year    Active Member of [employer] or Organizations: No    Attends Engineer, structural: Never    Marital Status: Divorced    Tobacco Counseling Counseling given: Not Answered    Clinical Intake:  Pre-visit preparation completed: Yes  Pain : 0-10 Pain Score: 3  Pain Type: Chronic pain Pain Location: Generalized (LOWER BACK, BILATERAL KNEES) Pain Descriptors / Indicators: Aching, Discomfort Pain Onset: More than a month ago Pain Frequency: Constant     BMI - recorded: 30.62 Nutritional Status: BMI > 30  Obese Nutritional Risks: None Diabetes: No  Lab Results  Component Value Date   HGBA1C 5.8 (A) 07/26/2023   HGBA1C 5.9 (H) 10/14/2021   HGBA1C 5.9 (H) 10/15/2020     How often do you need to have someone help you when you read instructions, pamphlets, or other written materials from your doctor or pharmacy?: 1 - Never What is the last grade level you completed in  school?: [employer]REE  Interpreter Needed?: No  Information entered by :: Roz Fuller, LPN.   Activities of Daily Living     04/03/2024    3:56 PM 08/21/2023   11:29 AM  In your present state of health, do you have any difficulty performing the following activities:  Hearing? 1 0  Comment NO HEARING AIDS   Vision? 0 0  Difficulty concentrating or making decisions? 0 0  Walking or climbing stairs? 1 1  Dressing or bathing? 0 0  Doing errands, shopping? 0 0  Preparing Food and eating ? N N  Using the Toilet? N N  In the past six months, have you accidently leaked urine? Y N  Comment GOES TO UROLOGIST AND TAKING MEDICATION   Do you have problems with loss of bowel control? N N  Managing your Medications? N N  Managing your Finances? N N  Housekeeping or managing your Housekeeping? N N    Patient Care Team: Rosan Dayton BROCKS, DO as PCP - General (Internal Medicine) Jesus Oliphant, MD as Consulting Physician (Otolaryngology) Tobie Franky SQUIBB, DPM as Consulting Physician (Podiatry) Selma [name] SAUNDERS, MD as Consulting Physician (Urology) [place], [place], OD as Consulting Physician (Optometry)  I have updated your Care Teams any recent Medical Services you may have received from other providers in the past year.     Assessment:   This is a routine wellness examination for Doc.  Hearing/Vision screen Hearing Screening - Comments:: Patient has hearing difficulties, no hearing aids.  Vision Screening - Comments:: Wears rx glasses - up to date with routine eye exams with [facility]     Goals  Addressed             This Visit's Progress    04/03/2024:       To lose some weight and get back to exercise.     Weight < 185 lb (83.915 kg)   225 lb 12.8 oz (102.4 kg)      Depression Screen     04/03/2024    3:55 PM 08/21/2023   11:36 AM 08/21/2023   11:32 AM 08/21/2023   10:58 AM 08/15/2022    9:18 AM 05/05/2022   11:34 AM 10/14/2021   10:51 AM  PHQ 2/9 Scores   PHQ - 2 Score 0 0 0 0 0 0 0  PHQ- 9 Score 0    2      Fall Risk     04/03/2024    3:54 PM 08/21/2023   11:35 AM 08/21/2023   10:57 AM 08/15/2022    9:18 AM 05/05/2022   11:34 AM  Fall Risk   Falls in the past year? 0 1 1 0 0  Number falls in past yr: 0 0 0  0  Injury with Fall? 0 0 0  0  Risk for fall due to : No Fall Risks Impaired balance/gait Impaired balance/gait Impaired balance/gait Impaired balance/gait  Follow up Falls evaluation completed Falls evaluation completed;Falls prevention discussed Falls prevention discussed Falls evaluation completed Falls evaluation completed;Falls prevention discussed      Data saved with a previous flowsheet row definition    MEDICARE RISK AT HOME:  Medicare Risk at Home Any stairs in or around the home?: Yes (2 steps front entrance) If so, are there any without handrails?: No Home free of loose throw rugs in walkways, pet beds, electrical cords, etc?: Yes Adequate lighting in your home to reduce risk of falls?: Yes Life alert?: No Use of a cane, walker or w/c?: Yes (cane) Grab bars in the bathroom?: Yes Shower chair or bench in shower?: Yes Elevated toilet seat or a handicapped toilet?: Yes  TIMED UP AND GO:  Was the test performed?  No  Cognitive Function: Declined/Normal: No cognitive concerns noted by patient or family. Patient alert, oriented, able to answer questions appropriately and recall recent events. No signs of memory loss or confusion.    04/03/2024    4:08 PM 03/27/2020   12:08 PM  MMSE - Mini Mental State Exam  Not completed: Unable to complete   Orientation to time  5  Orientation to Place  5  Registration  3  Attention/ Calculation  5  Recall  2  Language- name 2 objects  2  Language- repeat  1  Language- follow 3 step command  3  Language- read & follow direction  1  Write a sentence  1  Copy design  1  Total score  29        04/03/2024    4:08 PM 08/21/2023   11:37 AM  6CIT Screen  What Year? 0 points  0 points  What month? 0 points 0 points  What time? 0 points 0 points  Count back from 20 0 points 0 points  Months in reverse 0 points 0 points  Repeat phrase 0 points 0 points  Total Score 0 points 0 points    Immunizations Immunization History  Administered Date(s) Administered   Fluad Quad(high Dose 65+) 03/22/2021   INFLUENZA, HIGH DOSE SEASONAL PF 03/31/2022, 04/06/2023, 03/11/2024   Influenza Split 05/07/2012   Influenza,inj,Quad PF,6+ Mos 03/22/2013, 04/02/2014, 03/09/2015, 02/15/2016, 03/31/2017, 03/16/2018,  03/11/2019, 03/09/2020   Moderna Covid-19 Vaccine Bivalent Booster 42yrs & up 05/04/2021   PFIZER(Purple Top)SARS-COV-2 Vaccination 09/04/2019, 09/25/2019, 04/25/2020, 12/14/2020   PNEUMOCOCCAL CONJUGATE-20 12/24/2022   Pneumococcal Conjugate-13 08/03/2018   Pneumococcal Polysaccharide-23 10/17/2019   Td 01/24/2005   Tdap 03/27/2015, 12/24/2022   Unspecified SARS-COV-2 Vaccination 03/31/2022   Zoster Recombinant(Shingrix) 12/24/2022, 04/01/2023   Zoster, Live 06/01/2016    Screening Tests Health Maintenance  Topic Date Due   COVID-19 Vaccine (7 - 2025-26 season) 02/26/2024   Medicare Annual Wellness (AWV)  04/03/2025   Colonoscopy  10/10/2028   DTaP/Tdap/Td (4 - Td or Tdap) 12/23/2032   Pneumococcal Vaccine: 50+ Years  Completed   Influenza Vaccine  Completed   Hepatitis C Screening  Completed   Zoster Vaccines- Shingrix  Completed   Meningococcal B Vaccine  Aged Out    Health Maintenance Items Addressed: Vaccines Due: Covid-19, which patient declined.  Additional Screening:  Vision Screening: Recommended annual ophthalmology exams for early detection of glaucoma and other disorders of the eye. Is the patient up to date with their annual eye exam?  Yes  Who is the provider or what is the name of the office in which the patient attends annual eye exams? [place], [place].  Dental Screening: Recommended annual dental exams for proper oral  hygiene  Community Resource Referral / Chronic Care Management: CRR required this visit?  No   CCM required this visit?  No   Plan:    I have personally reviewed and noted the following in the patient's chart:   Medical and social history Use of alcohol, tobacco or illicit drugs  Current medications and supplements including opioid prescriptions. Patient is not currently taking opioid prescriptions. Functional ability and status Nutritional status Physical activity Advanced directives List of other physicians Hospitalizations, surgeries, and ER visits in previous 12 months Vitals Screenings to include cognitive, depression, and falls Referrals and appointments  In addition, I have reviewed and discussed with patient certain preventive protocols, quality metrics, and best practice recommendations. A written personalized care plan for preventive services as well as general preventive health recommendations were provided to patient.   Roz LOISE Fuller, LPN   [id number]/06/[id number]   After Visit Summary: (MyChart) Due to this being a telephonic visit, the after visit summary with patients personalized plan was offered to patient via MyChart   Notes: PCP Follow Up Recommendations: Patient is having issues with hearing and would like for his hearing to be cjhecked in the office or with Audiologist.  Also patient is wanting a complete cardiac workup due to the fact that he does not have the energy to work out anymore and that she has started having shortness of breathe.  Message sent to nurse to to triage and to make an appointment as soon as possible.

## 2024-04-03 NOTE — Patient Instructions (Signed)
 Alexander English,  Thank you for taking the time for your Medicare Wellness Visit. I appreciate your continued commitment to your health goals. Please review the care plan we discussed, and feel free to reach out if I can assist you further.  Medicare recommends these wellness visits once per year to help you and your care team stay ahead of potential health issues. These visits are designed to focus on prevention, allowing your provider to concentrate on managing your acute and chronic conditions during your regular appointments.  Please note that Annual Wellness Visits do not include a physical exam. Some assessments may be limited, especially if the visit was conducted virtually. If needed, we may recommend a separate in-person follow-up with your provider.  Ongoing Care Seeing your primary care provider every 3 to 6 months helps us  monitor your health and provide consistent, personalized care.   Referrals If a referral was made during today's visit and you haven't received any updates within two weeks, please contact the referred provider directly to check on the status.  Recommended Screenings:  Health Maintenance  Topic Date Due   COVID-19 Vaccine (7 - 2025-26 season) 02/26/2024   Medicare Annual Wellness Visit  04/03/2025   Colon Cancer Screening  10/10/2028   DTaP/Tdap/Td vaccine (4 - Td or Tdap) 12/23/2032   Pneumococcal Vaccine for age over 75  Completed   Flu Shot  Completed   Hepatitis C Screening  Completed   Zoster (Shingles) Vaccine  Completed   Meningitis B Vaccine  Aged Out       04/03/2024    3:53 PM  Advanced Directives  Does Patient Have a Medical Advance Directive? Yes  Type of Estate agent of [place];Living will  Copy of Healthcare Power of Attorney in Chart? No - copy requested   Advance Care Planning is important because it: Ensures you receive medical care that aligns with your values, goals, and preferences. Provides guidance to  your family and loved ones, reducing the emotional burden of decision-making during critical moments.  Vision: Annual vision screenings are recommended for early detection of glaucoma, cataracts, and diabetic retinopathy. These exams can also reveal signs of chronic conditions such as diabetes and high blood pressure.  Dental: Annual dental screenings help detect early signs of oral cancer, gum disease, and other conditions linked to overall health, including heart disease and diabetes.  Please see the attached documents for additional preventive care recommendations.

## 2024-04-03 NOTE — Telephone Encounter (Signed)
 PCP Follow Up Recommendations: Patient is having issues with hearing and would like for his hearing to be cjhecked in the office or with Audiologist.  Also patient is wanting a complete cardiac workup due to the fact that he does not have the energy to work out anymore and that she has started having shortness of breathe.  Message sent to triage nurse to talk with patient and see what should be done next.  Cicely Ortner N. Tomie, LPN [facility] Annual Wellness Team Direct Dial : [phone]

## 2024-04-04 NOTE — Telephone Encounter (Signed)
 Can we get him in for a visit to evaluate his low energy and SOB, may be with residents.

## 2024-04-04 NOTE — Progress Notes (Signed)
 Internal Medicine Attending:  I reviewed the AWV findings of the medical professional who conducted the visit. I was present in the office suite and immediately available to provide assistance and direction throughout the time the service was provided.  Will have him come in for a visit to evaluate symptoms.

## 2024-04-04 NOTE — Telephone Encounter (Signed)
 Called pt - informed Dr Rosan wants him to schedule an appt for his low energy,sob, and hearing. Offered appt for tomorrow; stated he has coumadin  appt on Monday; he wants to know if he can he be seen on this day also? Appt schedule w/Dr Godowin @ [phone].

## 2024-04-08 ENCOUNTER — Encounter: Payer: Self-pay | Admitting: Student

## 2024-04-08 ENCOUNTER — Ambulatory Visit (INDEPENDENT_AMBULATORY_CARE_PROVIDER_SITE_OTHER): Admitting: Pharmacist

## 2024-04-08 ENCOUNTER — Other Ambulatory Visit: Payer: Self-pay

## 2024-04-08 ENCOUNTER — Ambulatory Visit (INDEPENDENT_AMBULATORY_CARE_PROVIDER_SITE_OTHER): Admitting: Student

## 2024-04-08 VITALS — BP 123/69 | HR 62 | Temp 97.8°F | Ht 72.0 in | Wt 227.2 lb

## 2024-04-08 DIAGNOSIS — R5382 Chronic fatigue, unspecified: Secondary | ICD-10-CM

## 2024-04-08 DIAGNOSIS — Z7901 Long term (current) use of anticoagulants: Secondary | ICD-10-CM | POA: Diagnosis not present

## 2024-04-08 DIAGNOSIS — Z86718 Personal history of other venous thrombosis and embolism: Secondary | ICD-10-CM | POA: Diagnosis not present

## 2024-04-08 DIAGNOSIS — I639 Cerebral infarction, unspecified: Secondary | ICD-10-CM

## 2024-04-08 DIAGNOSIS — Z86711 Personal history of pulmonary embolism: Secondary | ICD-10-CM

## 2024-04-08 DIAGNOSIS — J439 Emphysema, unspecified: Secondary | ICD-10-CM | POA: Diagnosis not present

## 2024-04-08 DIAGNOSIS — Z87891 Personal history of nicotine dependence: Secondary | ICD-10-CM

## 2024-04-08 LAB — POCT INR: INR: 2.3 (ref 2.0–3.0)

## 2024-04-08 NOTE — Patient Instructions (Signed)
 Patient instructed to take medications as defined in the Anti-coagulation Track section of this encounter.  Patient instructed to take today's dose.  Patient instructed to take one (1) of your 5 mg strength, peach-colored warfarin tablets by mouth, once-daily every day--EXCEPT on MONDAYS and THURSDAYS, take only one-half (1/2) tablet on MONDAYS and THURSDAYS.  Patient verbalized understanding of these instructions.

## 2024-04-08 NOTE — Progress Notes (Signed)
 Anticoagulation Management Alexander English is a 68 y.o. male who reports to the clinic for monitoring of warfarin treatment.    Indication:History of CVA, DVT, and long term current use of oral anticoagulation with warfarin with target INR range 2.0 - 3.0.   Duration: indefinite Supervising physician: Mliss Foot, MD  Anticoagulation Clinic Visit History: Patient does not report signs/symptoms of bleeding or thromboembolism  Other recent changes: No diet, medications, lifestyle changes identified or cited by the patient at this visit. Anticoagulation Episode Summary     Current INR goal:  2.0-3.0  TTR:  80.8% (12 y)  Next INR check:  04/08/2024  INR from last check:  2.3 (04/08/2024)  Weekly max warfarin dose:  --  Target end date:  Indefinite  INR check location:  Anticoagulation Clinic  Preferred lab:  --  Send INR reminders to:  [facility]   Indications   History of venous thromboembolism [Z86.718]        Comments:  --         No Known Allergies  Current Outpatient Medications:    aspirin  EC 81 MG tablet, Take 81 mg by mouth daily., Disp: , Rfl:    atorvastatin  (LIPITOR) 10 MG tablet, Take 1 tablet (10 mg total) by mouth daily., Disp: 90 tablet, Rfl: 3   Baclofen  5 MG TABS, Take 1 tablet (5 mg total) by mouth 3 (three) times daily as needed (spasticity)., Disp: 90 tablet, Rfl: 2   fesoterodine  (TOVIAZ ) 8 MG TB24 tablet, Take 1 tablet (8 mg total) by mouth daily., Disp: 90 tablet, Rfl: 3   mirabegron  ER (MYRBETRIQ ) 50 MG TB24 tablet, Take 1 tablet (50 mg total) by mouth daily., Disp: 90 tablet, Rfl: 3   Multiple Vitamin (MULTIVITAMIN) tablet, Take 1 tablet by mouth daily., Disp: , Rfl:    propranolol  ER (INDERAL  LA) 60 MG 24 hr capsule, Take 1 capsule (60 mg total) by mouth daily., Disp: 90 capsule, Rfl: 3   tamsulosin (FLOMAX) 0.4 MG CAPS capsule, Take 0.4 mg by mouth daily., Disp: , Rfl:    warfarin (COUMADIN ) 5 MG tablet, Take one-half (1/2) tablet on Mondays,  Wednesdays and Fridays. All other days--Sundays, Tuesdays, Thursdays and Saturdays, take one (1) tablet., Disp: 72 tablet, Rfl: 1   zolpidem  (AMBIEN ) 5 MG tablet, Take 1 tablet (5 mg total) by mouth at bedtime as needed for sleep., Disp: 30 tablet, Rfl: 2   benzoyl peroxide -erythromycin  (BENZAMYCIN) gel, Apply to affected area 2 times daily (Patient not taking: Reported on 04/08/2024), Disp: 47 g, Rfl: 1   ciclopirox  (PENLAC ) 8 % solution, Apply topically at bedtime. Apply over nail and surrounding skin. Apply daily over previous coat. After seven (7) days, may remove with alcohol and continue cycle. (Patient not taking: Reported on 04/08/2024), Disp: 6.6 mL, Rfl: 0   diclofenac  Sodium (VOLTAREN  ARTHRITIS PAIN) 1 % GEL, Apply 2 g topically 4 (four) times daily. (Patient not taking: Reported on 04/08/2024), Disp: 50 g, Rfl: 0   predniSONE  (DELTASONE ) 10 MG tablet, 6 tabs po day 1, 5 tabs po day 2, 4 tabs po day 3, 3 tabs po day 4, 2 tabs po day 5, 1 tab po day 6 (Patient not taking: Reported on 04/08/2024), Disp: 21 tablet, Rfl: 0   tazarotene  (AVAGE ) 0.1 % cream, Apply topically at bedtime. To any area of acne. (Patient not taking: Reported on 04/08/2024), Disp: 30 g, Rfl: 0 Past Medical History:  Diagnosis Date   Aortic atherosclerosis 10/23/2017   Asymptomatic, seen  on CT scan   Bilateral cataracts 05/18/2016   Not visually significant   Cancer (HCC)    maxillary   Cerebrovascular accident (CVA) (HCC) 03/27/2020   Chronic constipation 06/12/2013   Chronic low back pain 06/12/2013   L4-5 facet arthropathy    Chronic pain of both shoulders 12/07/2013   A/C joint osteoarthritis    Cluster headaches    Resolved in 2006   COPD (chronic obstructive pulmonary disease) (HCC)    Dry eye syndrome, bilateral 05/18/2016   Embolic stroke involving right middle cerebral artery (HCC) 08/08/2004   Occured after traveling from [place]  to [place] where he was hospitalized for 1 month with no further work-up or  therapy.  [place]  un[place] to walk and was admitted to [facility] immediately after landing. Presumed embolic per neuro but TEE, bubble study, and hypercoag W/U negative. On indefinite coumadin  per patient's informed preference.  Residual effects : Left spastic hemiplegia. Tx with phenol tibi   Generalized anxiety disorder 08/03/2018   History of kidney stones    Hyperplastic colon polyp 07/27/2012   Excised endoscopically 07/27/2012   Hypertensive retinopathy of both eyes 05/18/2016   Internal and external hemorrhoids without complication 12/19/2017   Seen on colonoscopy 04/03/2007.   Left nephrolithiasis 08/31/2015   With mild left hydronephrosis.  Scheduled to undergo shockwave lithotripsy.   Obesity (BMI 30.0-34.9) 12/07/2013   Osteoarthritis of both knees 01/26/2012   Positive PPD 12/07/2013   Pulmonary embolism (HCC) 09/30/2004   Diagnosed in April 2006 after returning from [place].  Likely provoked as it occurred after a 12 hour plane flight within 2 months of R MCA CVA with left hemiparesis. Patient has made an informed decision to remain on lifelong warfarin.    Tubular adenoma 12/19/2017   Endoscopically excised 04/03/2007.   Urge incontinence of urine 08/31/2015   Possibly secondary to prior CVA.  Treated with Myrbetriq .   Social History   Socioeconomic History   Marital status: Single    Spouse name: Not on file   Number of children: 3   Years of education: 16   Highest education level: Not on file  Occupational History   Occupation: Retired  Tobacco Use   Smoking status: Former    Current packs/day: 0.00    Types: Cigarettes    Quit date: 06/28/2007    Years since quitting: 16.7   Smokeless tobacco: Never  Vaping Use   Vaping status: Never Used  Substance and Sexual Activity   Alcohol use: Yes    Comment: Rarely.   Drug use: No   Sexual activity: Not on file  Other Topics Concern   Not on file  Social History Narrative   Born in [place] but has been in the [place]  for  > 30 years.  Divorced, 1 daughter and 2 sons.  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   Right handed   Coffee/tea sometimes, soda rarely   Social Drivers of Corporate investment banker Strain: Low Risk  (04/03/2024)   Overall Financial Resource Strain (CARDIA)    Difficulty of Paying Living Expenses: Not hard at all  Food Insecurity: No Food Insecurity (04/03/2024)   Hunger Vital Sign    Worried About Running Out of Food in the Last Year: Never true    Ran Out of Food in the Last Year: Never true  Transportation Needs: No Transportation Needs (04/03/2024)   PRAPARE - Administrator, Civil Service (  Medical): No    Lack of Transportation (Non-Medical): No  Physical Activity: Inactive (04/03/2024)   Exercise Vital Sign    Days of Exercise per Week: 0 days    Minutes of Exercise per Session: 0 min  Stress: No Stress Concern Present (04/03/2024)   [employer] of Occupational Health - Occupational Stress Questionnaire    Feeling of Stress: Not at all  Social Connections: Moderately Isolated (04/03/2024)   Social Connection and Isolation Panel    Frequency of Communication with Friends and Family: More than three times a week    Frequency of Social Gatherings with Friends and Family: More than three times a week    Attends Religious Services: More than 4 times per year    Active Member of [employer] or Organizations: No    Attends Engineer, structural: Never    Marital Status: Divorced   Family History  Problem Relation Age of Onset   Stroke Maternal Grandmother    Unexplained death Mother    Depression Mother        After husband's death   Unexplained death Father    Osteoarthritis Father        Knees   Early death Sister    Seizures Sister    Early death Brother 32       Unknown cause   Healthy Daughter    Healthy Son    Stroke Maternal Aunt    Healthy Sister    Healthy Sister    Unexplained death Brother     Healthy Brother    Healthy Son     ASSESSMENT Recent Results: The most recent result is correlated with 30 mg warfarin per week: Lab Results  Component Value Date   INR 2.3 04/08/2024   INR 3.0 03/11/2024   INR 1.6 (A) 02/12/2024    Anticoagulation Dosing: Description   Take one (1) of your 5 mg strength, peach-colored warfarin tablets by mouth, once-daily every day--EXCEPT on MONDAYS and THURSDAYS, take only one-half (1/2) tablet on MONDAYS and THURSDAYS.      INR today: Therapeutic  PLAN Weekly dose was unchanged. Continue taking one (1) of your 5 mg peach colored warfarin tablets by mouth, once-daily, all days of the week--EXCEPT on MONDAYS and THURSDAYS, take only one-half (1/2) tablet on Mondays and Thursdays.   Patient Instructions  Patient instructed to take medications as defined in the Anti-coagulation Track section of this encounter.  Patient instructed to take today's dose.  Patient instructed to take one (1) of your 5 mg strength, peach-colored warfarin tablets by mouth, once-daily every day--EXCEPT on MONDAYS and THURSDAYS, take only one-half (1/2) tablet on MONDAYS and THURSDAYS.  Patient verbalized understanding of these instructions.  Patient advised to contact clinic or seek medical attention if signs/symptoms of bleeding or thromboembolism occur.  Patient verbalized understanding by repeating back information and was advised to contact me if further medication-related questions arise. Patient was also provided an information handout.  Follow-up Return in 4 weeks (on 05/06/2024) for Follow up INR.  Lynwood KATHEE Lites, PharmD, CPP Clinical Pharmacist Practitioner  15 minutes spent face-to-face with the patient during the encounter. 50% of time spent on education, including signs/sx bleeding and clotting, as well as food and drug interactions with warfarin. 50% of time was spent on fingerprick POC INR sample collection,processing, results determination, and  documentation in [email or web address].

## 2024-04-08 NOTE — Patient Instructions (Addendum)
 Thank you, Alexander English, for allowing us  to provide your care today. Today we discussed . . .  > Fatigue       - I am not sure exactly what is causing your low energy but I would like to check your blood counts, iron levels, electrolytes, and thyroid  function today.  I would also like to check your lung functions since you have had some emphysema on previous lung imaging to make sure that there were no signs of this causing difficulty breathing.  If all of this is normal and you continue to have the symptoms we we will send you for further evaluation of your heart with an ultrasound.  Please let us  know if you are having any new or worsening symptoms with this but I recommend that you continue to exercise as you are able, make sure that you are eating and drinking enough, and make sure that you are getting enough sleep.  I have ordered the following labs for you:  Lab Orders         CBC with Diff         Comprehensive metabolic panel with GFR         TSH         Iron and IBC ([id number])         Ferritin       Follow up: 3 months    Remember:  Should you have any questions or concerns please call the internal medicine clinic at [phone].     [name], DO [facility]

## 2024-04-08 NOTE — Progress Notes (Unsigned)
 CC: Acute Concern of fatigue  HPI:  Alexander English is a 68 y.o. male with pertinent PMH of prior CVA and PE on chronic warfarin, bilateral knee OA, spastic hemiplegia, and emphysema who presents as above. Please see assessment and plan below for further details.  Medications: Current Outpatient Medications  Medication Instructions   aspirin  EC 81 mg, Daily   atorvastatin  (LIPITOR) 10 mg, Oral, Daily   Baclofen  5 mg, Oral, 3 times daily PRN   benzoyl peroxide -erythromycin  (BENZAMYCIN) gel Apply to affected area 2 times daily   ciclopirox  (PENLAC ) 8 % solution Topical, Daily at bedtime, Apply over nail and surrounding skin. Apply daily over previous coat. After seven (7) days, may remove with alcohol and continue cycle.   diclofenac  Sodium (VOLTAREN  ARTHRITIS PAIN) 2 g, Topical, 4 times daily   fesoterodine  (TOVIAZ ) 8 mg, Oral, Daily   mirabegron  ER (MYRBETRIQ ) 50 mg, Oral, Daily   Multiple Vitamin (MULTIVITAMIN) tablet 1 tablet, Daily   predniSONE  (DELTASONE ) 10 MG tablet 6 tabs po day 1, 5 tabs po day 2, 4 tabs po day 3, 3 tabs po day 4, 2 tabs po day 5, 1 tab po day 6   propranolol  ER (INDERAL  LA) 60 mg, Oral, Daily   tamsulosin (FLOMAX) 0.4 mg, Daily   tazarotene  (AVAGE ) 0.1 % cream Topical, Daily at bedtime, To any area of acne.   warfarin (COUMADIN ) 5 MG tablet Take one-half (1/2) tablet on Mondays, Wednesdays and Fridays. All other days--Sundays, Tuesdays, Thursdays and Saturdays, take one (1) tablet.   zolpidem  (AMBIEN ) 5 mg, Oral, At bedtime PRN     Review of Systems:   Pertinent items noted in HPI and/or A&P.  Physical Exam:  Vitals:   04/08/24 0925  BP: 123/69  Pulse: 62  Temp: 97.8 F (36.6 C)  TempSrc: Oral  SpO2: 100%  Weight: 227 lb 3.2 oz (103.1 kg)  Height: 6' (1.829 m)    Constitutional: Chronically ill-appearing elderly male. In no acute distress. HEENT: Normocephalic, atraumatic, Sclera non-icteric, PERRL, EOM intact Cardio:Regular rate and  rhythm. 2+ bilateral radial pulses. Pulm:Clear to auscultation bilaterally. Normal work of breathing on room air. Abdomen: Soft, non-tender, non-distended, positive bowel sounds. MSK: Trace lower extremity edema bilaterally Skin:Warm and dry. Neuro:Alert and oriented x3.  Stable left-sided hemiplegia Psych:Pleasant mood and affect.   Assessment & Plan:   Assessment & Plan Chronic fatigue Emphysema of lung (HCC) Patient presents to discuss bothersome fatigue that has been present for at least 6 months if not longer.  He notes that he is unable to exercise like he used to do before he had his stroke and left-sided spastic hemiplegia.  Sometimes he is breathing heavier especially some mornings when he wakes up but he denies orthopnea, increased lower extremity edema, paroxysmal nocturnal dyspnea, or other worrisome signs for heart failure.  He has emphysema that has been seen on lung cancer screening CTs but he has not been on inhalers or other treatments.  He does have an intermittent cough but this has been present and stable for over a year.  Denies wheezing, resting dyspnea, or productive cough.  Oxygen saturations at rest and walking on flat ground here are 98+.  He also has about 10 pounds of weight loss in the past year but only eats about once a day and is working on losing weight.  Denies any chest pain, loss of appetite, dysphagia, nausea, vomiting, fevers, night sweats, melena, hematochezia, swollen lymph nodes, or other associated symptoms.  Last colonoscopy  was in 09/2023 and showed some hyperplastic noncancerous polyps.  He had a maxillary mass removed in 2019 that did not show any signs of malignancy under pathology.  Lab work including CBC, CMP, TSH, and iron panel are within normal limits.  Overall his fatigue is most consistent with chronic deconditioning and we will work on slowly introducing exercise as tolerated to improve his tolerance.  Return precautions discussed. - Return for  pulmonary function testing to rule out progressive emphysema  Orders Placed This Encounter  Procedures   CBC with Diff   Comprehensive metabolic panel with GFR   TSH   Iron and IBC ([id number])   Ferritin   Pulmonary function test    Standing Status:   Future    Expiration Date:   04/08/2025    Where should this test be performed?:   Outpatient Pulmonary    What type of PFT is being ordered?:   Full PFT     Return in about 3 months (around 07/09/2024) for Follow up fatigue.   Patient discussed with Dr. MICAEL Riis Winfrey  [name], DO [facility] Internal Medicine Resident PGY-3 Clinic Phone: [phone] Please contact the on call pager at [phone] for any urgent or emergent needs.

## 2024-04-09 ENCOUNTER — Ambulatory Visit: Payer: Self-pay | Admitting: Student

## 2024-04-09 LAB — CBC WITH DIFFERENTIAL/PLATELET
Basophils Absolute: 0.1 x10E3/uL (ref 0.0–0.2)
Basos: 1 %
EOS (ABSOLUTE): 0.8 x10E3/uL — ABNORMAL HIGH (ref 0.0–0.4)
Eos: 17 %
Hematocrit: 45.7 % (ref 37.5–51.0)
Hemoglobin: 14.9 g/dL (ref 13.0–17.7)
Immature Grans (Abs): 0 x10E3/uL (ref 0.0–0.1)
Immature Granulocytes: 0 %
Lymphocytes Absolute: 2.2 x10E3/uL (ref 0.7–3.1)
Lymphs: 44 %
MCH: 26.9 pg (ref 26.6–33.0)
MCHC: 32.6 g/dL (ref 31.5–35.7)
MCV: 83 fL (ref 79–97)
Monocytes Absolute: 0.4 x10E3/uL (ref 0.1–0.9)
Monocytes: 8 %
Neutrophils Absolute: 1.5 x10E3/uL (ref 1.4–7.0)
Neutrophils: 30 %
Platelets: 189 x10E3/uL (ref 150–450)
RBC: 5.53 x10E6/uL (ref 4.14–5.80)
RDW: 13.8 % (ref 11.6–15.4)
WBC: 4.9 x10E3/uL (ref 3.4–10.8)

## 2024-04-09 LAB — COMPREHENSIVE METABOLIC PANEL WITH GFR
ALT: 21 IU/L (ref 0–44)
AST: 21 IU/L (ref 0–40)
Albumin: 4.3 g/dL (ref 3.9–4.9)
Alkaline Phosphatase: 68 IU/L (ref 47–123)
BUN/Creatinine Ratio: 10 (ref 10–24)
BUN: 10 mg/dL (ref 8–27)
Bilirubin Total: 0.5 mg/dL (ref 0.0–1.2)
CO2: 17 mmol/L — ABNORMAL LOW (ref 20–29)
Calcium: 9.9 mg/dL (ref 8.6–10.2)
Chloride: 106 mmol/L (ref 96–106)
Creatinine, Ser: 0.97 mg/dL (ref 0.76–1.27)
Globulin, Total: 3.4 g/dL (ref 1.5–4.5)
Glucose: 89 mg/dL (ref 70–99)
Potassium: 4.1 mmol/L (ref 3.5–5.2)
Sodium: 143 mmol/L (ref 134–144)
Total Protein: 7.7 g/dL (ref 6.0–8.5)
eGFR: 85 mL/min/1.73 (ref >59)

## 2024-04-09 LAB — IRON AND TIBC
Iron Saturation: 29 % (ref 15–55)
Iron: 83 ug/dL (ref 38–169)
Total Iron Binding Capacity: 287 ug/dL (ref 250–450)
UIBC: 204 ug/dL (ref 111–343)

## 2024-04-09 LAB — FERRITIN: Ferritin: 116 ng/mL (ref 30–400)

## 2024-04-09 LAB — TSH: TSH: 1.59 u[IU]/mL (ref 0.450–4.500)

## 2024-04-09 NOTE — Progress Notes (Signed)
Evaluation and management procedures were performed by the Clinical Pharmacy Practitioner under my supervision and collaboration. I have reviewed the Practitioner's note and chart, and I agree with the management and plan as documented above. ° °

## 2024-04-09 NOTE — Progress Notes (Signed)
 Overall normal results with the exception of eosinophilia and mildly low bicarb which is frequently a product of degradation in transport to the lab.  Pending PFTs for further workup of lung disease.  May benefit from inhalers if PFT is abnormal.

## 2024-04-09 NOTE — Assessment & Plan Note (Signed)
 Patient presents to discuss bothersome fatigue that has been present for at least 6 months if not longer.  He notes that he is unable to exercise like he used to do before he had his stroke and left-sided spastic hemiplegia.  Sometimes he is breathing heavier especially some mornings when he wakes up but he denies orthopnea, increased lower extremity edema, paroxysmal nocturnal dyspnea, or other worrisome signs for heart failure.  He has emphysema that has been seen on lung cancer screening CTs but he has not been on inhalers or other treatments.  He does have an intermittent cough but this has been present and stable for over a year.  Denies wheezing, resting dyspnea, or productive cough.  Oxygen saturations at rest and walking on flat ground here are 98+.  He also has about 10 pounds of weight loss in the past year but only eats about once a day and is working on losing weight.  Denies any chest pain, loss of appetite, dysphagia, nausea, vomiting, fevers, night sweats, melena, hematochezia, swollen lymph nodes, or other associated symptoms.  Last colonoscopy was in 09/2023 and showed some hyperplastic noncancerous polyps.  He had a maxillary mass removed in 2019 that did not show any signs of malignancy under pathology.  Lab work including CBC, CMP, TSH, and iron panel are within normal limits.  Overall his fatigue is most consistent with chronic deconditioning and we will work on slowly introducing exercise as tolerated to improve his tolerance.  Return precautions discussed. - Return for pulmonary function testing to rule out progressive emphysema

## 2024-04-10 NOTE — Progress Notes (Signed)
 Internal Medicine Clinic Attending  Case discussed with the resident at the time of the visit.  We reviewed the resident's history and exam and pertinent patient test results.  I agree with the assessment, diagnosis, and plan of care documented in the resident's note.

## 2024-04-16 ENCOUNTER — Encounter: Payer: Self-pay | Admitting: Podiatry

## 2024-04-16 ENCOUNTER — Ambulatory Visit: Admitting: Podiatry

## 2024-04-16 DIAGNOSIS — L603 Nail dystrophy: Secondary | ICD-10-CM

## 2024-04-16 NOTE — Progress Notes (Signed)
 This patient presents to the office for evaluation of his left big toenail.  He previously had his toenail removed and it has grown back thick long.  He states he is having pain walking in his left big toe.  He presents to the office today for evaluation and treatment. He has history of CVA and coagulation defect due to coumadin .  General Appearance  Alert, conversant and in no acute stress.  Vascular  Dorsalis pedis and posterior tibial  pulses are palpable  bilaterally.  Capillary return is within normal limits  bilaterally. Temperature is within normal limits  bilaterally.  Neurologic  Senn-Weinstein monofilament wire test within normal limits  bilaterally. Muscle power within normal limits bilaterally.  Nails The left hallux nail is thickened long and unattached. To the nail bed. No evidence of bacterial infection or drainage bilaterally.  Orthopedic  No limitations of motion  feet .  No crepitus or effusions noted.  No bony pathology or digital deformities noted.  Skin  normotropic skin with no porokeratosis noted bilaterally.  No signs of infections or ulcers noted.  Nail dystrophy left hallux  ROV.  Trim  Nail.  RTC 3 months

## 2024-04-30 NOTE — Telephone Encounter (Signed)
 Copied from CRM #[id number]. Topic: Referral - Question >> Apr 26, 2024  9:46 AM Alexander English ORN wrote: Reason for CRM: patient need  a new referral to see an  urologist reason  recieved letter from his  insurance that [name], gay  the urologist is not in network at , [facility] urology   Please contact patient  ----------------------------------------------------------------------- From previous Reason for Contact - Referral Status: Reason for CRM: >> Apr 29, 2024  3:34 PM [name] wrote: Patient is following up for a callback to his primary phone #

## 2024-05-01 DIAGNOSIS — H40023 Open angle with borderline findings, high risk, bilateral: Secondary | ICD-10-CM | POA: Diagnosis not present

## 2024-05-01 LAB — OPHTHALMOLOGY REPORT-SCANNED

## 2024-05-06 ENCOUNTER — Ambulatory Visit (INDEPENDENT_AMBULATORY_CARE_PROVIDER_SITE_OTHER)

## 2024-05-06 ENCOUNTER — Ambulatory Visit (INDEPENDENT_AMBULATORY_CARE_PROVIDER_SITE_OTHER): Admitting: Pharmacist

## 2024-05-06 VITALS — BP 145/82 | HR 69 | Ht 72.0 in | Wt 229.6 lb

## 2024-05-06 DIAGNOSIS — I639 Cerebral infarction, unspecified: Secondary | ICD-10-CM

## 2024-05-06 DIAGNOSIS — Z7901 Long term (current) use of anticoagulants: Secondary | ICD-10-CM

## 2024-05-06 DIAGNOSIS — I69354 Hemiplegia and hemiparesis following cerebral infarction affecting left non-dominant side: Secondary | ICD-10-CM | POA: Diagnosis not present

## 2024-05-06 DIAGNOSIS — M25512 Pain in left shoulder: Secondary | ICD-10-CM | POA: Diagnosis not present

## 2024-05-06 DIAGNOSIS — Z87891 Personal history of nicotine dependence: Secondary | ICD-10-CM | POA: Diagnosis not present

## 2024-05-06 DIAGNOSIS — R030 Elevated blood-pressure reading, without diagnosis of hypertension: Secondary | ICD-10-CM | POA: Diagnosis not present

## 2024-05-06 DIAGNOSIS — M549 Dorsalgia, unspecified: Secondary | ICD-10-CM | POA: Diagnosis not present

## 2024-05-06 DIAGNOSIS — Z86711 Personal history of pulmonary embolism: Secondary | ICD-10-CM

## 2024-05-06 DIAGNOSIS — Z86718 Personal history of other venous thrombosis and embolism: Secondary | ICD-10-CM | POA: Diagnosis not present

## 2024-05-06 DIAGNOSIS — M19042 Primary osteoarthritis, left hand: Secondary | ICD-10-CM

## 2024-05-06 DIAGNOSIS — N3941 Urge incontinence: Secondary | ICD-10-CM

## 2024-05-06 DIAGNOSIS — Z79899 Other long term (current) drug therapy: Secondary | ICD-10-CM | POA: Diagnosis not present

## 2024-05-06 DIAGNOSIS — I6930 Unspecified sequelae of cerebral infarction: Secondary | ICD-10-CM

## 2024-05-06 LAB — POCT INR: INR: 2.4 (ref 2.0–3.0)

## 2024-05-06 MED ORDER — DICLOFENAC SODIUM 1 % EX GEL
2.0000 g | Freq: Four times a day (QID) | CUTANEOUS | 0 refills | Status: DC
Start: 2024-05-06 — End: 2024-05-06

## 2024-05-06 MED ORDER — LIDOCAINE 5 % EX PTCH: 1.0000 | MEDICATED_PATCH | CUTANEOUS | 0 refills | Status: AC

## 2024-05-06 MED ORDER — LIDOCAINE 5 % EX PTCH: 1.0000 | MEDICATED_PATCH | CUTANEOUS | 0 refills | Status: DC

## 2024-05-06 MED ORDER — DICLOFENAC SODIUM 1 % EX GEL: 2.0000 g | Freq: Four times a day (QID) | CUTANEOUS | 0 refills | Status: AC

## 2024-05-06 NOTE — Progress Notes (Unsigned)
 CC: shoulder pain   HPI:  [name] is a 68 y.o. male with pertinent past medical history of chronic pain of both shoulders (osteoarthritis A/C joint), primary osteoarthritis of right shoulder, weakness of right hand, (further medical history stated below) and presents today for ***. Please see problem based assessment and plan for additional details.  Pertinent imaging:  -07/19/2023 LEFT SHOULDER: degenerative changes of the [facility] joint with no soft tissue calcifications. No fracture, dislocation, or bony lesions.   -12/14/2023: RIGHT HUMERUS AND SHOULDER right humerus intact, negative for fracture. Degenerative changes and small loose bodies at right [facility] joint. No gross abnormality of right elbow. No focal soft tissue. Right shoulder without fracture. Right ribs intact. Mild degenerative changes at right glenohumeral joint.  Pertinent History:  -Previous referrals to physical therapy have been placed. Last office visit 12/06/2023: physical exam findings consistent with right shoulder osteoarthritis and ulnar nerve impingement at the elbow. Recommended Voltaren  gel on shoulder. Referral sent to sports medicine. Patient canceled 12/27/2023 due to feeling better -12/25/2023: received right shoulder joint steroid injection for bursitis.  -For weakness of right hand, OT referral placed. LVM to call back and schedule 08/02/2023.   Left sided pain: started 2-3 days ago. Mostly left under armpit. Patient believes this is caused by how he was sleeping on shoulder. Has history of left sided deficit for stroke (2006). Uses left hand sporadically. Stroke deficits have not gotten worse over past couple of days. No recent heavy lifting. When he bends down on right side, he feels pain on the left. Pain is not worse during certain period of day. When laying down on back, there is no pain. When using a pillow, it helps. No fevers or chills, no swelling, no rash. Sometimes will radiate to arm pit. Pain on  side will radiate to back with stretching motion. Could not pinpoint on top of shoulder where pain is, mostly under arm pit. Normally sleeps on right side, woke up on left side. Carries left arm at flexed position. Tylenol  helped.   Likely 2/2 muscle pain with movement.  - physical therapy  - Voltaren  gel  - lidocaine  patch  - tylenol    Blood pressure: Does not have a blood pressure cuff.   Last Pertinent Labs Documented:     Latest Ref Rng & Units 04/08/2024   10:25 AM 10/14/2021   11:22 AM 02/12/2021    9:30 AM  BMP  Glucose 70 - 99 mg/dL 89  93  884   BUN 8 - 27 mg/dL 10  12  15    Creatinine 0.76 - 1.27 mg/dL 9.02  9.13  9.03   BUN/Creat Ratio 10 - 24 10  14     Sodium 134 - 144 mmol/L 143  141  139   Potassium 3.5 - 5.2 mmol/L 4.1  4.1  3.7   Chloride 96 - 106 mmol/L 106  106  110   CO2 20 - 29 mmol/L 17  19  23    Calcium  8.6 - 10.2 mg/dL 9.9  9.6  9.3        Latest Ref Rng & Units 04/08/2024   10:25 AM 02/12/2021    9:30 AM 11/30/2020    9:38 AM  CBC  WBC 3.4 - 10.8 x10E3/uL 4.9  5.4    Hemoglobin 13.0 - 17.7 g/dL 85.0  86.2  86.3   Hematocrit 37.5 - 51.0 % 45.7  44.5  42.6   Platelets 150 - 450 x10E3/uL 189  169  Lab Results  Component Value Date   HGBA1C 5.8 (A) 07/26/2023   HGBA1C 5.9 (H) 10/14/2021   HGBA1C 5.9 (H) 10/15/2020     Past Medical History:  Diagnosis Date   Aortic atherosclerosis 10/23/2017   Asymptomatic, seen on CT scan   Bilateral cataracts 05/18/2016   Not visually significant   Cancer (HCC)    maxillary   Cerebrovascular accident (CVA) (HCC) 03/27/2020   Chronic constipation 06/12/2013   Chronic low back pain 06/12/2013   L4-5 facet arthropathy    Chronic pain of both shoulders 12/07/2013   A/C joint osteoarthritis    Cluster headaches    Resolved in 2006   COPD (chronic obstructive pulmonary disease) (HCC)    Dry eye syndrome, bilateral 05/18/2016   Embolic stroke involving right middle cerebral artery (HCC) 08/08/2004   Occured  after traveling from [place]  to [place] where he was hospitalized for 1 month with no further work-up or therapy.  [place] and was admitted to [facility] immediately after landing. Presumed embolic per neuro but TEE, bubble study, and hypercoag W/U negative. On indefinite coumadin  per patient's informed preference.  Residual effects : Left spastic hemiplegia. Tx with phenol tibi   Generalized anxiety disorder 08/03/2018   History of kidney stones    Hyperplastic colon polyp 07/27/2012   Excised endoscopically 07/27/2012   Hypertensive retinopathy of both eyes 05/18/2016   Internal and external hemorrhoids without complication 12/19/2017   Seen on colonoscopy 04/03/2007.   Left nephrolithiasis 08/31/2015   With mild left hydronephrosis.  Scheduled to undergo shockwave lithotripsy.   Obesity (BMI 30.0-34.9) 12/07/2013   Osteoarthritis of both knees 01/26/2012   Positive PPD 12/07/2013   Pulmonary embolism (HCC) 09/30/2004   Diagnosed in April 2006 after returning from [place].  Likely provoked as it occurred after a 12 hour plane flight within 2 months of R MCA CVA with left hemiparesis. Patient has made an informed decision to remain on lifelong warfarin.    Tubular adenoma 12/19/2017   Endoscopically excised 04/03/2007.   Urge incontinence of urine 08/31/2015   Possibly secondary to prior CVA.  Treated with Myrbetriq .    Current Outpatient Medications on File Prior to Visit  Medication Sig Dispense Refill   aspirin  EC 81 MG tablet Take 81 mg by mouth daily.     atorvastatin  (LIPITOR) 10 MG tablet Take 1 tablet (10 mg total) by mouth daily. 90 tablet 3   Baclofen  5 MG TABS Take 1 tablet (5 mg total) by mouth 3 (three) times daily as needed (spasticity). 90 tablet 2   benzoyl peroxide -erythromycin  (BENZAMYCIN) gel Apply to affected area 2 times daily (Patient not taking: Reported on 05/06/2024) 47 g 1   ciclopirox  (PENLAC ) 8 % solution Apply topically at bedtime. Apply over nail and  surrounding skin. Apply daily over previous coat. After seven (7) days, may remove with alcohol and continue cycle. (Patient not taking: Reported on 05/06/2024) 6.6 mL 0   fesoterodine  (TOVIAZ ) 8 MG TB24 tablet Take 1 tablet (8 mg total) by mouth daily. 90 tablet 3   mirabegron  ER (MYRBETRIQ ) 50 MG TB24 tablet Take 1 tablet (50 mg total) by mouth daily. 90 tablet 3   Multiple Vitamin (MULTIVITAMIN) tablet Take 1 tablet by mouth daily.     predniSONE  (DELTASONE ) 10 MG tablet 6 tabs po day 1, 5 tabs po day 2, 4 tabs po day 3, 3 tabs po day 4, 2 tabs po day 5, 1 tab po day 6 (Patient  not taking: Reported on 05/06/2024) 21 tablet 0   propranolol  ER (INDERAL  LA) 60 MG 24 hr capsule Take 1 capsule (60 mg total) by mouth daily. 90 capsule 3   tamsulosin (FLOMAX) 0.4 MG CAPS capsule Take 0.4 mg by mouth daily.     tazarotene  (AVAGE ) 0.1 % cream Apply topically at bedtime. To any area of acne. (Patient not taking: Reported on 05/06/2024) 30 g 0   warfarin (COUMADIN ) 5 MG tablet Take one-half (1/2) tablet on Mondays, Wednesdays and Fridays. All other days--Sundays, Tuesdays, Thursdays and Saturdays, take one (1) tablet. 72 tablet 1   zolpidem  (AMBIEN ) 5 MG tablet Take 1 tablet (5 mg total) by mouth at bedtime as needed for sleep. 30 tablet 2   No current facility-administered medications on file prior to visit.    Family History  Problem Relation Age of Onset   Stroke Maternal Grandmother    Unexplained death Mother    Depression Mother        After husband's death   Unexplained death Father    Osteoarthritis Father        Knees   Early death Sister    Seizures Sister    Early death Brother 61       Unknown cause   Healthy Daughter    Healthy Son    Stroke Maternal Aunt    Healthy Sister    Healthy Sister    Unexplained death Brother    Healthy Brother    Healthy Son     Social History   Socioeconomic History   Marital status: Single    Spouse name: Not on file   Number of children: 3    Years of education: 16   Highest education level: Not on file  Occupational History   Occupation: Retired  Tobacco Use   Smoking status: Former    Current packs/day: 0.00    Types: Cigarettes    Quit date: 06/28/2007    Years since quitting: 16.8   Smokeless tobacco: Never  Vaping Use   Vaping status: Never Used  Substance and Sexual Activity   Alcohol use: Yes    Comment: Rarely.   Drug use: No   Sexual activity: Not on file  Other Topics Concern   Not on file  Social History Narrative   Born in [place] but has been in the [place]  for > [place] years.  Divorced, 1 daughter and 2 sons.  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   Right handed   Coffee/tea sometimes, soda rarely   Social Drivers of Corporate Investment Banker Strain: Low Risk  (04/03/2024)   Overall Financial Resource Strain (CARDIA)    Difficulty of Paying Living Expenses: Not hard at all  Food Insecurity: No Food Insecurity (04/03/2024)   Hunger Vital Sign    Worried About Running Out of Food in the Last Year: Never true    Ran Out of Food in the Last Year: Never true  Transportation Needs: No Transportation Needs (04/03/2024)   PRAPARE - Administrator, Civil Service (Medical): No    Lack of Transportation (Non-Medical): No  Physical Activity: Inactive (04/03/2024)   Exercise Vital Sign    Days of Exercise per Week: 0 days    Minutes of Exercise per Session: 0 min  Stress: No Stress Concern Present (04/03/2024)   [employer] of Occupational Health - Occupational Stress Questionnaire    Feeling of Stress: Not  at all  Social Connections: Moderately Isolated (04/03/2024)   Social Connection and Isolation Panel    Frequency of Communication with Friends and Family: More than three times a week    Frequency of Social Gatherings with Friends and Family: More than three times a week    Attends Religious Services: More than 4 times per year    Active Member of  [employer] or Organizations: No    Attends Banker Meetings: Never    Marital Status: Divorced  Catering Manager Violence: Not At Risk (04/03/2024)   Humiliation, Afraid, Rape, and Kick questionnaire    Fear of Current or Ex-Partner: No    Emotionally Abused: No    Physically Abused: No    Sexually Abused: No    Review of Systems: ROS   Vitals:   05/06/24 1311 05/06/24 1346  BP: (!) 163/92 (!) 145/82  Pulse: 68 69  SpO2: 96%   Weight: 229 lb 9.6 oz (104.1 kg)   Height: 6' (1.829 m)     Physical Exam: Physical Exam   Assessment & Plan:   Patient {GC/[id number]::discussed with,seen with} Dr. {[id number]::Tpoopjfd,Z. Hoffman,Mullen,Narendra,Vincent,Guilloud,Lau,Machen,Winfrey}  Assessment & Plan Osteoarthritis of left hand, unspecified osteoarthritis type  Cerebrovascular accident (CVA), unspecified mechanism (HCC)  History of stroke with residual deficit  Urge incontinence of urine    Orders Placed This Encounter  Procedures   Ambulatory referral to Physical Therapy    Referral Priority:   Routine    Referral Type:   Physical Medicine    Referral Reason:   Specialty Services Required    Requested Specialty:   Physical Therapy    Number of Visits Requested:   1     Lorean Ekstrand, D.O. [facility], PGY-1 Date 05/06/2024 Time 1:51 PM

## 2024-05-06 NOTE — Patient Instructions (Addendum)
 Today we discussed the following medical conditions and plan:   - I put in for physical therapy  - use voltaren  gel on your shoulder  - use the lidocaine  patch under your armpit on the area that hurts (wear patch for 12 hours, take patch off for 12 hours, then you can put on another patch if needed (do not wear patch for 24 hours) - use tylenol  for breakthrough pain - if the 5% lidocaine  patch is too expensive, you can get 4% lidocaine  patches in the store.  -get a blood pressure cuff and take your blood pressure at home couple times a week when you are relaxed and with both your feet on the floor. Please keep a log and if top number is 140 or higher, call the clinic and let us  know, we may have to start you on a new medication.   We look forward to seeing you next time. Please call our clinic at [phone] if you have any questions or concerns. The best time to call is Monday-Friday from 9am-4pm, but there is someone available 24/7. If you need medication refills, please notify your pharmacy one week in advance and they will send us  a request.   Thank you for trusting me with your care. Wishing you the best!   Sallyanne Primas, DO  [facility]

## 2024-05-06 NOTE — Progress Notes (Signed)
 Anticoagulation Management Alexander English is a 68 y.o. male who reports to the clinic for monitoring of warfarin treatment.    Indication: Previous venous thromboembolism (PE); Previous CVA; Long term, current use of oral anticoagulation with warfarin to an INR range of 2.0 - 3.0.  Duration: indefinite Supervising physician: Mliss Honor door, MD  Anticoagulation Clinic Visit History: Patient does not report signs/symptoms of bleeding or thromboembolism  Other recent changes: No diet, medications, lifestyle changes cited or identified.  Anticoagulation Episode Summary     Current INR goal:  2.0-3.0  TTR:  81.0% (12.1 y)  Next INR check:  05/06/2024  INR from last check:  --  Weekly max warfarin dose:  --  Target end date:  Indefinite  INR check location:  Anticoagulation Clinic  Preferred lab:  --  Send INR reminders to:  ANTICOAG [facility]   Indications   History of venous thromboembolism [Z86.718]        Comments:  --         No Known Allergies  Current Outpatient Medications:    aspirin  EC 81 MG tablet, Take 81 mg by mouth daily., Disp: , Rfl:    atorvastatin  (LIPITOR) 10 MG tablet, Take 1 tablet (10 mg total) by mouth daily., Disp: 90 tablet, Rfl: 3   Baclofen  5 MG TABS, Take 1 tablet (5 mg total) by mouth 3 (three) times daily as needed (spasticity)., Disp: 90 tablet, Rfl: 2   diclofenac  Sodium (VOLTAREN  ARTHRITIS PAIN) 1 % GEL, Apply 2 g topically 4 (four) times daily., Disp: 50 g, Rfl: 0   fesoterodine  (TOVIAZ ) 8 MG TB24 tablet, Take 1 tablet (8 mg total) by mouth daily., Disp: 90 tablet, Rfl: 3   mirabegron  ER (MYRBETRIQ ) 50 MG TB24 tablet, Take 1 tablet (50 mg total) by mouth daily., Disp: 90 tablet, Rfl: 3   Multiple Vitamin (MULTIVITAMIN) tablet, Take 1 tablet by mouth daily., Disp: , Rfl:    propranolol  ER (INDERAL  LA) 60 MG 24 hr capsule, Take 1 capsule (60 mg total) by mouth daily., Disp: 90 capsule, Rfl: 3   tamsulosin (FLOMAX) 0.4 MG CAPS capsule, Take 0.4 mg by  mouth daily., Disp: , Rfl:    warfarin (COUMADIN ) 5 MG tablet, Take one-half (1/2) tablet on Mondays, Wednesdays and Fridays. All other days--Sundays, Tuesdays, Thursdays and Saturdays, take one (1) tablet., Disp: 72 tablet, Rfl: 1   zolpidem  (AMBIEN ) 5 MG tablet, Take 1 tablet (5 mg total) by mouth at bedtime as needed for sleep., Disp: 30 tablet, Rfl: 2   benzoyl peroxide -erythromycin  (BENZAMYCIN) gel, Apply to affected area 2 times daily (Patient not taking: Reported on 05/06/2024), Disp: 47 g, Rfl: 1   ciclopirox  (PENLAC ) 8 % solution, Apply topically at bedtime. Apply over nail and surrounding skin. Apply daily over previous coat. After seven (7) days, may remove with alcohol and continue cycle. (Patient not taking: Reported on 05/06/2024), Disp: 6.6 mL, Rfl: 0   predniSONE  (DELTASONE ) 10 MG tablet, 6 tabs po day 1, 5 tabs po day 2, 4 tabs po day 3, 3 tabs po day 4, 2 tabs po day 5, 1 tab po day 6 (Patient not taking: Reported on 05/06/2024), Disp: 21 tablet, Rfl: 0   tazarotene  (AVAGE ) 0.1 % cream, Apply topically at bedtime. To any area of acne. (Patient not taking: Reported on 05/06/2024), Disp: 30 g, Rfl: 0 Past Medical History:  Diagnosis Date   Aortic atherosclerosis 10/23/2017   Asymptomatic, seen on CT scan   Bilateral cataracts 05/18/2016  Not visually significant   Cancer (HCC)    maxillary   Cerebrovascular accident (CVA) (HCC) 03/27/2020   Chronic constipation 06/12/2013   Chronic low back pain 06/12/2013   L4-5 facet arthropathy    Chronic pain of both shoulders 12/07/2013   A/C joint osteoarthritis    Cluster headaches    Resolved in 2006   COPD (chronic obstructive pulmonary disease) (HCC)    Dry eye syndrome, bilateral 05/18/2016   Embolic stroke involving right middle cerebral artery (HCC) 08/08/2004   Occured after traveling from [place]  to [place] where he was hospitalized for 1 month with no further work-up or therapy.  [place] and was admitted to  [facility] immediately after landing. Presumed embolic per neuro but TEE, bubble study, and hypercoag W/U negative. On indefinite coumadin  per patient's informed preference.  Residual effects : Left spastic hemiplegia. Tx with phenol tibi   Generalized anxiety disorder 08/03/2018   History of kidney stones    Hyperplastic colon polyp 07/27/2012   Excised endoscopically 07/27/2012   Hypertensive retinopathy of both eyes 05/18/2016   Internal and external hemorrhoids without complication 12/19/2017   Seen on colonoscopy 04/03/2007.   Left nephrolithiasis 08/31/2015   With mild left hydronephrosis.  Scheduled to undergo shockwave lithotripsy.   Obesity (BMI 30.0-34.9) 12/07/2013   Osteoarthritis of both knees 01/26/2012   Positive PPD 12/07/2013   Pulmonary embolism (HCC) 09/30/2004   Diagnosed in April 2006 after returning from [place].  Likely provoked as it occurred after a 12 hour plane flight within 2 months of R MCA CVA with left hemiparesis. Patient has made an informed decision to remain on lifelong warfarin.    Tubular adenoma 12/19/2017   Endoscopically excised 04/03/2007.   Urge incontinence of urine 08/31/2015   Possibly secondary to prior CVA.  Treated with Myrbetriq .   Social History   Socioeconomic History   Marital status: Single    Spouse name: Not on file   Number of children: 3   Years of education: 16   Highest education level: Not on file  Occupational History   Occupation: Retired  Tobacco Use   Smoking status: Former    Current packs/day: 0.00    Types: Cigarettes    Quit date: 06/28/2007    Years since quitting: 16.8   Smokeless tobacco: Never  Vaping Use   Vaping status: Never Used  Substance and Sexual Activity   Alcohol use: Yes    Comment: Rarely.   Drug use: No   Sexual activity: Not on file  Other Topics Concern   Not on file  Social History Narrative   Born in [place] but has been in the [place]  for > [place] years.  Divorced, 1 daughter and 2 sons.  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   Right handed   Coffee/tea sometimes, soda rarely   Social Drivers of Corporate Investment Banker Strain: Low Risk  (04/03/2024)   Overall Financial Resource Strain (CARDIA)    Difficulty of Paying Living Expenses: Not hard at all  Food Insecurity: No Food Insecurity (04/03/2024)   Hunger Vital Sign    Worried About Running Out of Food in the Last Year: Never true    Ran Out of Food in the Last Year: Never true  Transportation Needs: No Transportation Needs (04/03/2024)   PRAPARE - Administrator, Civil Service (Medical): No    Lack of Transportation (Non-Medical): No  Physical Activity: Inactive (04/03/2024)   Exercise Vital Sign    Days of Exercise per Week: 0 days    Minutes of Exercise per Session: 0 min  Stress: No Stress Concern Present (04/03/2024)   [employer] of Occupational Health - Occupational Stress Questionnaire    Feeling of Stress: Not at all  Social Connections: Moderately Isolated (04/03/2024)   Social Connection and Isolation Panel    Frequency of Communication with Friends and Family: More than three times a week    Frequency of Social Gatherings with Friends and Family: More than three times a week    Attends Religious Services: More than 4 times per year    Active Member of [employer] or Organizations: No    Attends Engineer, Structural: Never    Marital Status: Divorced   Family History  Problem Relation Age of Onset   Stroke Maternal Grandmother    Unexplained death Mother    Depression Mother        After husband's death   Unexplained death Father    Osteoarthritis Father        Knees   Early death Sister    Seizures Sister    Early death Brother 27       Unknown cause   Healthy Daughter    Healthy Son    Stroke Maternal Aunt    Healthy Sister    Healthy Sister    Unexplained death Brother    Healthy Brother    Healthy Son     ASSESSMENT Recent  Results: The most recent result is correlated with 30 mg  warfarin per week: Lab Results  Component Value Date   INR 2.4 05/06/2024   INR 2.3 04/08/2024   INR 3.0 03/11/2024    Anticoagulation Dosing: Description   Take one (1) of your 5 mg strength, peach-colored warfarin tablets by mouth, once-daily every day--EXCEPT on MONDAYS and THURSDAYS, take only one-half (1/2) tablet on MONDAYS and THURSDAYS.      INR today: Therapeutic  PLAN Weekly dose was unchanged.   Patient Instructions  Patient instructed to take medications as defined in the Anti-coagulation Track section of this encounter.  Patient instructed to take today's dose.  Patient instructed to take one (1) of your 5 mg strength, peach-colored warfarin tablets by mouth, once-daily every day--EXCEPT on MONDAYS and THURSDAYS, take only one-half (1/2) tablet on MONDAYS and THURSDAYS Patient verbalized understanding of these instructions.  Patient advised to contact clinic or seek medical attention if signs/symptoms of bleeding or thromboembolism occur.  Patient verbalized understanding by repeating back information and was advised to contact me if further medication-related questions arise. Patient was also provided an information handout.  Follow-up Return in 4 weeks (on 06/03/2024) for Follow up INR.  Alexander English, PharmD, CPP Clinical Pharmacist Practitioner  15 minutes spent face-to-face with the patient during the encounter. 50% of time spent on education, including signs/sx bleeding and clotting, as well as food and drug interactions with warfarin. 50% of time was spent on fingerprick POC INR sample collection,processing, results determination, and documentation in [email or web address].

## 2024-05-06 NOTE — Patient Instructions (Signed)
 Patient instructed to take medications as defined in the Anti-coagulation Track section of this encounter.  Patient instructed to take today's dose.  Patient instructed to take one (1) of your 5 mg strength, peach-colored warfarin tablets by mouth, once-daily every day--EXCEPT on MONDAYS and THURSDAYS, take only one-half (1/2) tablet on MONDAYS and THURSDAYS.  Patient verbalized understanding of these instructions.

## 2024-05-06 NOTE — Progress Notes (Signed)
Evaluation and management procedures were performed by the Clinical Pharmacy Practitioner under my supervision and collaboration. I have reviewed the Practitioner's note and chart, and I agree with the management and plan as documented above. ° °

## 2024-05-07 DIAGNOSIS — M549 Dorsalgia, unspecified: Secondary | ICD-10-CM | POA: Insufficient documentation

## 2024-05-07 DIAGNOSIS — M25512 Pain in left shoulder: Secondary | ICD-10-CM | POA: Insufficient documentation

## 2024-05-07 NOTE — Assessment & Plan Note (Addendum)
 Not currently taking blood pressure medication.  Is prescribed propranolol  for essential tremor.  Patient did not take that this morning.  Does not have blood pressure cuff at home. Instructed patient to get BP cuff and take BP a couple of times a week and to record if systolic is >140 and to call the clinic so we a medication  - instructed patient to get BP cuff  - f/u in 1-2 months

## 2024-05-07 NOTE — Assessment & Plan Note (Addendum)
 Pertinent imaging:  Left sided pain: started 2-3 days ago. Mostly left under armpit/around ribs. Patient believes this was caused by how he was sleeping on shoulder. Has history of left sided deficit for stroke (2006). Uses left hand sometimes to stabilize, but unable to grasp or do much with it. Stroke deficits have not gotten worse over past couple of days. No recent heavy lifting. When he bends down on right side, he feels pain on the left side. Pain is not worse during certain period of day. When laying down on back, there is no pain. No fevers or chills, no swelling, no rash. Sometimes will radiate to arm pit. Pain on left side will radiate to back with stretching motion. Could not pinpoint on top of shoulder where pain is, mostly under arm pit. Carries left arm at flexed position. Tylenol  helped when he did take it at home. Pain elicited only with right side-bending.  Likely 2/2 muscle pain with movement. Would benefit from PT given left sided deficits from CVA and to help stretch/avoid further injury with motion.  - physical therapy order placed - Voltaren  gel ordered  - lidocaine  patch ordered - tylenol  PRN

## 2024-05-07 NOTE — Assessment & Plan Note (Signed)
 Previous urologist unable to see him due to insurance.  Patient had no current complaints at this time. -New urology referral sent

## 2024-05-07 NOTE — Assessment & Plan Note (Deleted)
 Pertinent imaging:  -07/19/2023 LEFT SHOULDER: degenerative changes of the [facility] joint with no soft tissue calcifications. No fracture, dislocation, or bony lesions.   Left sided pain: started 2-3 days ago. Mostly left under armpit. Patient believes this is caused by how he was sleeping on shoulder. Has history of left sided deficit for stroke (2006). Uses left hand sporadically. Stroke deficits have not gotten worse over past couple of days. No recent heavy lifting. When he bends down on right side, he feels pain on the left. Pain is not worse during certain period of day. When laying down on back, there is no pain. When using a pillow, it helps. No fevers or chills, no swelling, no rash. Sometimes will radiate to arm pit. Pain on side will radiate to back with stretching motion. Could not pinpoint on top of shoulder where pain is, mostly under arm pit. Normally sleeps on right side, woke up on left side. Carries left arm at flexed position. Tylenol  helped.   Likely 2/2 muscle pain with movement.  - physical therapy  - Voltaren  gel  - lidocaine  patch  - tylenol 

## 2024-05-07 NOTE — Assessment & Plan Note (Signed)
 Pertinent imaging:  Left sided pain: started 2-3 days ago. Mostly left under armpit/around ribs. Patient believes this was caused by how he was sleeping on shoulder. Has history of left sided deficit for stroke (2006). Uses left hand sometimes to stabilize, but unable to grasp or do much with it. Stroke deficits have not gotten worse over past couple of days. No recent heavy lifting. When he bends down on right side, he feels pain on the left side. Pain is not worse during certain period of day. When laying down on back, there is no pain. No fevers or chills, no swelling, no rash. Sometimes will radiate to arm pit. Pain on left side will radiate to back with stretching motion. Could not pinpoint on top of shoulder where pain is, mostly under arm pit. Carries left arm at flexed position. Tylenol  helped when he did take it at home. Pain elicited only with right side-bending.  Likely 2/2 muscle pain with movement. Would benefit from PT given left sided deficits from CVA and to help stretch/avoid further injury with motion.  - physical therapy order placed - Voltaren  gel ordered  - lidocaine  patch ordered - tylenol  PRN

## 2024-05-08 NOTE — Progress Notes (Signed)
Internal Medicine Clinic Attending  I was physically present during the key portions of the resident provided service and participated in the medical decision making of patient's management care. I reviewed pertinent patient test results.  The assessment, diagnosis, and plan were formulated together and I agree with the documentation in the resident's note.  Mercie Eon, MD

## 2024-05-09 DIAGNOSIS — C310 Malignant neoplasm of maxillary sinus: Secondary | ICD-10-CM | POA: Diagnosis not present

## 2024-05-10 ENCOUNTER — Encounter: Payer: Self-pay | Admitting: Student

## 2024-05-10 ENCOUNTER — Ambulatory Visit (INDEPENDENT_AMBULATORY_CARE_PROVIDER_SITE_OTHER): Admitting: Student

## 2024-05-10 ENCOUNTER — Other Ambulatory Visit: Payer: Self-pay

## 2024-05-10 VITALS — BP 115/60 | HR 64 | Temp 98.1°F | Ht 72.0 in | Wt 230.2 lb

## 2024-05-10 DIAGNOSIS — Z7901 Long term (current) use of anticoagulants: Secondary | ICD-10-CM | POA: Diagnosis not present

## 2024-05-10 DIAGNOSIS — R7303 Prediabetes: Secondary | ICD-10-CM

## 2024-05-10 DIAGNOSIS — Z79899 Other long term (current) drug therapy: Secondary | ICD-10-CM

## 2024-05-10 DIAGNOSIS — Z87891 Personal history of nicotine dependence: Secondary | ICD-10-CM

## 2024-05-10 DIAGNOSIS — I6930 Unspecified sequelae of cerebral infarction: Secondary | ICD-10-CM

## 2024-05-10 DIAGNOSIS — R030 Elevated blood-pressure reading, without diagnosis of hypertension: Secondary | ICD-10-CM

## 2024-05-10 DIAGNOSIS — Z7982 Long term (current) use of aspirin: Secondary | ICD-10-CM

## 2024-05-10 DIAGNOSIS — I69354 Hemiplegia and hemiparesis following cerebral infarction affecting left non-dominant side: Secondary | ICD-10-CM

## 2024-05-10 MED ORDER — OMRON 7 SERIES BP MONITOR DEVI: 0 refills | Status: AC

## 2024-05-10 NOTE — Patient Instructions (Addendum)
 Thank you, Mr.Dalan O Cranford for allowing us  to provide your care today. Today we discussed:  -I sent order for blood pressure cuff/monitor to your pharmacy -If monitor not covered by insurance, you may need to purchase one at the store/pharmacy -Blood pressure log provided, measure your blood pressure twice a day and record it -Follow up in 3-4 weeks to review and discuss blood pressure -Blood work to check cholesterol and pre-diabetes   Follow up: 1 month    Remember: Should you have any questions or concerns please call the internal medicine clinic at [phone].    Jeromie Gainor, D.O. [facility]

## 2024-05-10 NOTE — Progress Notes (Signed)
 Internal Medicine Clinic Attending  Case discussed with the resident at the time of the visit.  We reviewed the resident's history and exam and pertinent patient test results.  I agree with the assessment, diagnosis, and plan of care documented in the resident's note.

## 2024-05-10 NOTE — Telephone Encounter (Signed)
 Pt called in to get the phone number to the office Dr.Rosen gave him the number to at his visit yesterday due to the number he called being the fax number.Please call him at [phone].

## 2024-05-10 NOTE — Assessment & Plan Note (Addendum)
 Last A1c 5.8 in 06/2023.  Agreeable for recheck A1c today.  Orders:   Hemoglobin A1c

## 2024-05-10 NOTE — Assessment & Plan Note (Signed)
 Presents for acute visit for c/f elevated BP w/o dx of HTN. Was seen recently 11/10 and discussed BP monitoring at home w/ BP cuff and f/u in 1-2 months (BP at that time 145/82). However, presented to ENT yesterday and arrival BP was 160/92 but no repeat BP completed. Patient states feeling well w/o acute symptoms. Takes propranolol  for essential tremor. BP on repeat today improved to 115/60. Hold off BP meds today. Last CMP 03/2024 w/ normal renal function.   Plan -Rx sent for BP cuff/monitor to pharmacy (advised patient if not covered he may have to purchase out of pocket, he understands and willing to pay for monitor) -BP log provided and patient to record at home, instructions on how to take BP at home provided -Resume routine f/u in 1-2 months   Orders:   Blood Pressure Monitoring (OMRON 7 SERIES BP MONITOR) DEVI; Measure your blood pressure at home.

## 2024-05-10 NOTE — Assessment & Plan Note (Addendum)
 Reports CVA 20 years ago w/ left sided residual weakness. Uses cane. No acute concerns.  Last LDL 43 in 09/2021.  Reports taking atorvastatin  10 mg.  Agreeable for repeat lipid panel today.  Orders:   Lipid Profile

## 2024-05-10 NOTE — Progress Notes (Signed)
 CC: Blood pressure  HPI: Alexander English is a 68 y.o. male living with a history stated below and presents today for blood pressure. Please see problem based assessment and plan for additional details.   Past Medical History:  Diagnosis Date   Aortic atherosclerosis 10/23/2017   Asymptomatic, seen on CT scan   Bilateral cataracts 05/18/2016   Not visually significant   Cancer (HCC)    maxillary   Cerebrovascular accident (CVA) (HCC) 03/27/2020   Chronic constipation 06/12/2013   Chronic low back pain 06/12/2013   L4-5 facet arthropathy    Chronic pain of both shoulders 12/07/2013   A/C joint osteoarthritis    Cluster headaches    Resolved in 2006   COPD (chronic obstructive pulmonary disease) (HCC)    Dry eye syndrome, bilateral 05/18/2016   Embolic stroke involving right middle cerebral artery (HCC) 08/08/2004   Occured after traveling from [place]  to [place] where he was hospitalized for 1 month with no further work-up or therapy.  [place]  un[place] to walk and was admitted to [facility] immediately after landing. Presumed embolic per neuro but TEE, bubble study, and hypercoag W/U negative. On indefinite coumadin  per patient's informed preference.  Residual effects : Left spastic hemiplegia. Tx with phenol tibi   Generalized anxiety disorder 08/03/2018   History of kidney stones    Hyperplastic colon polyp 07/27/2012   Excised endoscopically 07/27/2012   Hypertensive retinopathy of both eyes 05/18/2016   Internal and external hemorrhoids without complication 12/19/2017   Seen on colonoscopy 04/03/2007.   Left nephrolithiasis 08/31/2015   With mild left hydronephrosis.  Scheduled to undergo shockwave lithotripsy.   Obesity (BMI 30.0-34.9) 12/07/2013   Osteoarthritis of both knees 01/26/2012   Positive PPD 12/07/2013   Pulmonary embolism (HCC) 09/30/2004   Diagnosed in April 2006 after returning from [place].  Likely provoked as it occurred after a 12 hour plane flight within  2 months of R MCA CVA with left hemiparesis. Patient has made an informed decision to remain on lifelong warfarin.    Tubular adenoma 12/19/2017   Endoscopically excised 04/03/2007.   Urge incontinence of urine 08/31/2015   Possibly secondary to prior CVA.  Treated with Myrbetriq .    Current Outpatient Medications on File Prior to Visit  Medication Sig Dispense Refill   aspirin  EC 81 MG tablet Take 81 mg by mouth daily.     atorvastatin  (LIPITOR) 10 MG tablet Take 1 tablet (10 mg total) by mouth daily. 90 tablet 3   Baclofen  5 MG TABS Take 1 tablet (5 mg total) by mouth 3 (three) times daily as needed (spasticity). 90 tablet 2   benzoyl peroxide -erythromycin  (BENZAMYCIN) gel Apply to affected area 2 times daily (Patient not taking: Reported on 05/06/2024) 47 g 1   ciclopirox  (PENLAC ) 8 % solution Apply topically at bedtime. Apply over nail and surrounding skin. Apply daily over previous coat. After seven (7) days, may remove with alcohol and continue cycle. (Patient not taking: Reported on 05/06/2024) 6.6 mL 0   diclofenac  Sodium (VOLTAREN  ARTHRITIS PAIN) 1 % GEL Apply 2 g topically 4 (four) times daily. 100 g 0   fesoterodine  (TOVIAZ ) 8 MG TB24 tablet Take 1 tablet (8 mg total) by mouth daily. 90 tablet 3   lidocaine  (LIDODERM ) 5 % Place 1 patch onto the skin daily for 10 days. Remove & Discard patch within 12 hours or as directed by MD 10 patch 0   mirabegron  ER (MYRBETRIQ ) 50 MG TB24 tablet Take 1 tablet (  50 mg total) by mouth daily. 90 tablet 3   Multiple Vitamin (MULTIVITAMIN) tablet Take 1 tablet by mouth daily.     predniSONE  (DELTASONE ) 10 MG tablet 6 tabs po day 1, 5 tabs po day 2, 4 tabs po day 3, 3 tabs po day 4, 2 tabs po day 5, 1 tab po day 6 (Patient not taking: Reported on 05/06/2024) 21 tablet 0   propranolol  ER (INDERAL  LA) 60 MG 24 hr capsule Take 1 capsule (60 mg total) by mouth daily. 90 capsule 3   tamsulosin (FLOMAX) 0.4 MG CAPS capsule Take 0.4 mg by mouth daily.      tazarotene  (AVAGE ) 0.1 % cream Apply topically at bedtime. To any area of acne. (Patient not taking: Reported on 05/06/2024) 30 g 0   warfarin (COUMADIN ) 5 MG tablet Take one-half (1/2) tablet on Mondays, Wednesdays and Fridays. All other days--Sundays, Tuesdays, Thursdays and Saturdays, take one (1) tablet. 72 tablet 1   zolpidem  (AMBIEN ) 5 MG tablet Take 1 tablet (5 mg total) by mouth at bedtime as needed for sleep. 30 tablet 2   No current facility-administered medications on file prior to visit.    Family History  Problem Relation Age of Onset   Stroke Maternal Grandmother    Unexplained death Mother    Depression Mother        After husband's death   Unexplained death Father    Osteoarthritis Father        Knees   Early death Sister    Seizures Sister    Early death Brother 29       Unknown cause   Healthy Daughter    Healthy Son    Stroke Maternal Aunt    Healthy Sister    Healthy Sister    Unexplained death Brother    Healthy Brother    Healthy Son     Social History   Socioeconomic History   Marital status: Single    Spouse name: Not on file   Number of children: 3   Years of education: 16   Highest education level: Not on file  Occupational History   Occupation: Retired  Tobacco Use   Smoking status: Former    Current packs/day: 0.00    Types: Cigarettes    Quit date: 06/28/2007    Years since quitting: 16.8   Smokeless tobacco: Never  Vaping Use   Vaping status: Never Used  Substance and Sexual Activity   Alcohol use: Yes    Comment: Rarely.   Drug use: No   Sexual activity: Not on file  Other Topics Concern   Not on file  Social History Narrative   Born in [place] but has been in the [place]  for > 30 years.  Divorced, 1 daughter and 2 sons.  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   Right handed   Coffee/tea sometimes, soda rarely   Social Drivers of Corporate Investment Banker Strain: Low Risk   (04/03/2024)   Overall Financial Resource Strain (CARDIA)    Difficulty of Paying Living Expenses: Not hard at all  Food Insecurity: No Food Insecurity (04/03/2024)   Hunger Vital Sign    Worried About Running Out of Food in the Last Year: Never true    Ran Out of Food in the Last Year: Never true  Transportation Needs: No Transportation Needs (04/03/2024)   PRAPARE - Administrator, Civil Service (Medical): No  Lack of Transportation (Non-Medical): No  Physical Activity: Inactive (04/03/2024)   Exercise Vital Sign    Days of Exercise per Week: 0 days    Minutes of Exercise per Session: 0 min  Stress: No Stress Concern Present (04/03/2024)   [employer] of Occupational Health - Occupational Stress Questionnaire    Feeling of Stress: Not at all  Social Connections: Moderately Isolated (04/03/2024)   Social Connection and Isolation Panel    Frequency of Communication with Friends and Family: More than three times a week    Frequency of Social Gatherings with Friends and Family: More than three times a week    Attends Religious Services: More than 4 times per year    Active Member of [employer] or Organizations: No    Attends Banker Meetings: Never    Marital Status: Divorced  Catering Manager Violence: Not At Risk (04/03/2024)   Humiliation, Afraid, Rape, and Kick questionnaire    Fear of Current or Ex-Partner: No    Emotionally Abused: No    Physically Abused: No    Sexually Abused: No    Review of Systems: ROS negative except for what is noted on the assessment and plan.  Vitals:   05/10/24 0932 05/10/24 0934 05/10/24 1000  BP: (!) 148/79 136/85 115/60  Pulse: 71 71 64  Temp: 98.1 F (36.7 C)    TempSrc: Oral    SpO2: 98%    Weight: 230 lb 3.2 oz (104.4 kg)    Height: 6' (1.829 m)     Physical Exam: Constitutional: alert, sitting up in chair comfortably w/ cane, in no acute distress Cardiovascular: regular rate and rhythm Pulmonary/Chest: normal  work of breathing on room air Neurological: alert & oriented x 3 Skin: warm and dry  Assessment & Plan:   Assessment & Plan Elevated blood pressure reading in office without diagnosis of hypertension Presents for acute visit for c/f elevated BP w/o dx of HTN. Was seen recently 11/10 and discussed BP monitoring at home w/ BP cuff and f/u in 1-2 months (BP at that time 145/82). However, presented to ENT yesterday and arrival BP was 160/92 but no repeat BP completed. Patient states feeling well w/o acute symptoms. Takes propranolol  for essential tremor. BP on repeat today improved to 115/60. Hold off BP meds today. Last CMP 03/2024 w/ normal renal function.   Plan -Rx sent for BP cuff/monitor to pharmacy (advised patient if not covered he may have to purchase out of pocket, he understands and willing to pay for monitor) -BP log provided and patient to record at home, instructions on how to take BP at home provided -Resume routine f/u in 1-2 months   Orders:   Blood Pressure Monitoring (OMRON 7 SERIES BP MONITOR) DEVI; Measure your blood pressure at home.  History of stroke with residual deficit Reports CVA 20 years ago w/ left sided residual weakness. Uses cane. No acute concerns.  Last LDL 43 in 09/2021.  Reports taking atorvastatin  10 mg.  Agreeable for repeat lipid panel today.  Orders:   Lipid Profile  Prediabetes Last A1c 5.8 in 06/2023.  Agreeable for recheck A1c today.  Orders:   Hemoglobin A1c    Return in about 4 weeks (around 06/07/2024) for blood pressure check .   Patient discussed with Dr. Shawn Ozell Nearing, D.O. [facility], PGY-3 Clinic Phone: [phone] Date 05/10/2024 Time 1:53 PM

## 2024-05-11 LAB — LIPID PANEL
Chol/HDL Ratio: 2.3 ratio (ref 0.0–5.0)
Cholesterol, Total: 126 mg/dL (ref 100–199)
HDL: 56 mg/dL (ref >39)
LDL Chol Calc (NIH): 53 mg/dL (ref 0–99)
Triglycerides: 86 mg/dL (ref 0–149)
VLDL Cholesterol Cal: 17 mg/dL (ref 5–40)

## 2024-05-11 LAB — HEMOGLOBIN A1C
Est. average glucose Bld gHb Est-mCnc: 117 mg/dL
Hgb A1c MFr Bld: 5.7 % — ABNORMAL HIGH (ref 4.8–5.6)

## 2024-05-13 ENCOUNTER — Ambulatory Visit: Payer: Self-pay | Admitting: Student

## 2024-06-03 ENCOUNTER — Ambulatory Visit

## 2024-06-10 ENCOUNTER — Ambulatory Visit

## 2024-06-17 ENCOUNTER — Ambulatory Visit: Admitting: Pharmacist

## 2024-06-17 ENCOUNTER — Telehealth: Payer: Self-pay | Admitting: Pharmacist

## 2024-06-17 ENCOUNTER — Ambulatory Visit: Payer: Self-pay | Admitting: Internal Medicine

## 2024-06-17 DIAGNOSIS — Z7901 Long term (current) use of anticoagulants: Secondary | ICD-10-CM

## 2024-06-17 DIAGNOSIS — Z86718 Personal history of other venous thrombosis and embolism: Secondary | ICD-10-CM | POA: Diagnosis not present

## 2024-06-17 LAB — POCT INR: INR: 1.7 — AB (ref 2.0–3.0)

## 2024-06-17 NOTE — Progress Notes (Signed)
 Anticoagulation Management Alexander English is a 68 y.o. male who reports to the clinic for monitoring of warfarin treatment.    Indication: CVA and DVT, History of, remote, but CVA with lasting sequelae. Long term current use of oral anticoagulation with warfarin for target INR range 2.0 - 3.0.  Duration: indefinite Supervising physician: Mliss Foot, MD  Anticoagulation Clinic Visit History: Patient does not report signs/symptoms of bleeding or thromboembolism  Other recent changes: No diet, medications, lifestyle changes identified or volunteered by the patient at this visit.  Anticoagulation Episode Summary     Current INR goal:  2.0-3.0  TTR:  80.7% (12.2 y)  Next INR check:  07/15/2024  INR from last check:  1.7 (06/17/2024)  Weekly max warfarin dose:  --  Target end date:  Indefinite  INR check location:  Anticoagulation Clinic  Preferred lab:  --  Send INR reminders to:  [facility]   Indications   History of venous thromboembolism [Z86.718]        Comments:  --         Allergies[1] Current Medications[2] Past Medical History:  Diagnosis Date   Aortic atherosclerosis 10/23/2017   Asymptomatic, seen on CT scan   Bilateral cataracts 05/18/2016   Not visually significant   Cancer (HCC)    maxillary   Cerebrovascular accident (CVA) (HCC) 03/27/2020   Chronic constipation 06/12/2013   Chronic low back pain 06/12/2013   L4-5 facet arthropathy    Chronic pain of both shoulders 12/07/2013   A/C joint osteoarthritis    Cluster headaches    Resolved in 2006   COPD (chronic obstructive pulmonary disease) (HCC)    Dry eye syndrome, bilateral 05/18/2016   Embolic stroke involving right middle cerebral artery (HCC) 08/08/2004   Occured after traveling from [place]  to [place] where he was hospitalized for 1 month with no further work-up or therapy.  [place] and was admitted to [facility] immediately after landing. Presumed embolic per neuro but TEE,  bubble study, and hypercoag W/U negative. On indefinite coumadin  per patient's informed preference.  Residual effects : Left spastic hemiplegia. Tx with phenol tibi   Generalized anxiety disorder 08/03/2018   History of kidney stones    Hyperplastic colon polyp 07/27/2012   Excised endoscopically 07/27/2012   Hypertensive retinopathy of both eyes 05/18/2016   Internal and external hemorrhoids without complication 12/19/2017   Seen on colonoscopy 04/03/2007.   Left nephrolithiasis 08/31/2015   With mild left hydronephrosis.  Scheduled to undergo shockwave lithotripsy.   Obesity (BMI 30.0-34.9) 12/07/2013   Osteoarthritis of both knees 01/26/2012   Positive PPD 12/07/2013   Pulmonary embolism (HCC) 09/30/2004   Diagnosed in April 2006 after returning from [place].  Likely provoked as it occurred after a 12 hour plane flight within 2 months of R MCA CVA with left hemiparesis. Patient has made an informed decision to remain on lifelong warfarin.    Tubular adenoma 12/19/2017   Endoscopically excised 04/03/2007.   Urge incontinence of urine 08/31/2015   Possibly secondary to prior CVA.  Treated with Myrbetriq .   Social History   Socioeconomic History   Marital status: Single    Spouse name: Not on file   Number of children: 3   Years of education: 16   Highest education level: Not on file  Occupational History   Occupation: Retired  Tobacco Use   Smoking status: Former    Current packs/day: 0.00    Types: Cigarettes    Quit date:  06/28/2007    Years since quitting: 16.9   Smokeless tobacco: Never  Vaping Use   Vaping status: Never Used  Substance and Sexual Activity   Alcohol use: Yes    Comment: Rarely.   Drug use: No   Sexual activity: Not on file  Other Topics Concern   Not on file  Social History Narrative   Born in [place] but has been in the [place]  for > [place] years.  Divorced, 1 daughter and 2 sons.  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   Right handed   Coffee/tea sometimes, soda rarely   Social Drivers of Health   Tobacco Use: Medium Risk (05/10/2024)   Patient History    Smoking Tobacco Use: Former    Smokeless Tobacco Use: Never    Passive Exposure: Not on file  Financial Resource Strain: Low Risk (04/03/2024)   Overall Financial Resource Strain (CARDIA)    Difficulty of Paying Living Expenses: Not hard at all  Food Insecurity: No Food Insecurity (04/03/2024)   Epic    Worried About Programme Researcher, Broadcasting/film/video in the Last Year: Never true    Ran Out of Food in the Last Year: Never true  Transportation Needs: No Transportation Needs (04/03/2024)   Epic    Lack of Transportation (Medical): No    Lack of Transportation (Non-Medical): No  Physical Activity: Inactive (04/03/2024)   Exercise Vital Sign    Days of Exercise per Week: 0 days    Minutes of Exercise per Session: 0 min  Stress: No Stress Concern Present (04/03/2024)   [employer] of Occupational Health - Occupational Stress Questionnaire    Feeling of Stress: Not at all  Social Connections: Moderately Isolated (04/03/2024)   Social Connection and Isolation Panel    Frequency of Communication with Friends and Family: More than three times a week    Frequency of Social Gatherings with Friends and Family: More than three times a week    Attends Religious Services: More than 4 times per year    Active Member of Clubs or Organizations: No    Attends Banker Meetings: Never    Marital Status: Divorced  Depression (PHQ2-9): Low Risk (05/10/2024)   Depression (PHQ2-9)    PHQ-2 Score: 0  Alcohol Screen: Low Risk (04/03/2024)   Alcohol Screen    Last Alcohol Screening Score (AUDIT): 1  Housing: Low Risk (04/03/2024)   Epic    Unable to Pay for Housing in the Last Year: No    Number of Times Moved in the Last Year: 0    Homeless in the Last Year: No  Utilities: Not At Risk (04/03/2024)   Epic    Threatened with loss of utilities: No  Health  Literacy: Adequate Health Literacy (04/03/2024)   B1300 Health Literacy    Frequency of need for help with medical instructions: Never   Family History  Problem Relation Age of Onset   Stroke Maternal Grandmother    Unexplained death Mother    Depression Mother        After husband's death   Unexplained death Father    Osteoarthritis Father        Knees   Early death Sister    Seizures Sister    Early death Brother 66       Unknown cause   Healthy Daughter    Healthy Son    Stroke Maternal Aunt    Healthy Sister  Healthy Sister    Unexplained death Brother    Healthy Brother    Healthy Son     ASSESSMENT Recent Results: The most recent result is correlated with 30 mg per week: Lab Results  Component Value Date   INR 1.7 (A) 06/17/2024   INR 2.4 05/06/2024   INR 2.3 04/08/2024    Anticoagulation Dosing: Description   Take one (1) of your 5 mg strength, peach-colored warfarin tablets by mouth, once-daily every day.     INR today: Subtherapeutic  PLAN Weekly dose was increased by 16% to 35 mg per week  Patient Instructions  Patient instructed to take medications as defined in the Anti-coagulation Track section of this encounter.  Patient instructed to take today's dose.  Patient instructed to take one (1) of your 5 mg strength, peach-colored warfarin tablets by mouth, once-daily every day. Patient verbalized understanding of these instructions.  Patient advised to contact clinic or seek medical attention if signs/symptoms of bleeding or thromboembolism occur.  Patient verbalized understanding by repeating back information and was advised to contact me if further medication-related questions arise. Patient was also provided an information handout.  Follow-up Return in 4 weeks (on 07/15/2024) for Follow up INR.  Lynwood KATHEE Lites, PharmD, CPP Clinical Pharmacist Practitioner  15 minutes spent face-to-face with the patient during the encounter. 50% of time spent on  education, including signs/sx bleeding and clotting, as well as food and drug interactions with warfarin. 50% of time was spent on fingerprick POC INR sample collection,processing, results determination, and documentation in [email or web address].    [1] No Known Allergies [2]  Current Outpatient Medications:    aspirin  EC 81 MG tablet, Take 81 mg by mouth daily., Disp: , Rfl:    atorvastatin  (LIPITOR) 10 MG tablet, Take 1 tablet (10 mg total) by mouth daily., Disp: 90 tablet, Rfl: 3   Baclofen  5 MG TABS, Take 1 tablet (5 mg total) by mouth 3 (three) times daily as needed (spasticity)., Disp: 90 tablet, Rfl: 2   benzoyl peroxide -erythromycin  (BENZAMYCIN) gel, Apply to affected area 2 times daily (Patient not taking: Reported on 05/06/2024), Disp: 47 g, Rfl: 1   Blood Pressure Monitoring (OMRON 7 SERIES BP MONITOR) DEVI, Measure your blood pressure at home., Disp: 1 each, Rfl: 0   ciclopirox  (PENLAC ) 8 % solution, Apply topically at bedtime. Apply over nail and surrounding skin. Apply daily over previous coat. After seven (7) days, may remove with alcohol and continue cycle. (Patient not taking: Reported on 05/06/2024), Disp: 6.6 mL, Rfl: 0   diclofenac  Sodium (VOLTAREN  ARTHRITIS PAIN) 1 % GEL, Apply 2 g topically 4 (four) times daily., Disp: 100 g, Rfl: 0   fesoterodine  (TOVIAZ ) 8 MG TB24 tablet, Take 1 tablet (8 mg total) by mouth daily., Disp: 90 tablet, Rfl: 3   mirabegron  ER (MYRBETRIQ ) 50 MG TB24 tablet, Take 1 tablet (50 mg total) by mouth daily., Disp: 90 tablet, Rfl: 3   Multiple Vitamin (MULTIVITAMIN) tablet, Take 1 tablet by mouth daily., Disp: , Rfl:    predniSONE  (DELTASONE ) 10 MG tablet, 6 tabs po day 1, 5 tabs po day 2, 4 tabs po day 3, 3 tabs po day 4, 2 tabs po day 5, 1 tab po day 6 (Patient not taking: Reported on 05/06/2024), Disp: 21 tablet, Rfl: 0   propranolol  ER (INDERAL  LA) 60 MG 24 hr capsule, Take 1 capsule (60 mg total) by mouth daily., Disp: 90 capsule, Rfl: 3    tamsulosin (FLOMAX) 0.4 MG CAPS capsule,  Take 0.4 mg by mouth daily., Disp: , Rfl:    tazarotene  (AVAGE ) 0.1 % cream, Apply topically at bedtime. To any area of acne. (Patient not taking: Reported on 05/06/2024), Disp: 30 g, Rfl: 0   warfarin (COUMADIN ) 5 MG tablet, Take one-half (1/2) tablet on Mondays, Wednesdays and Fridays. All other days--Sundays, Tuesdays, Thursdays and Saturdays, take one (1) tablet., Disp: 72 tablet, Rfl: 1   zolpidem  (AMBIEN ) 5 MG tablet, Take 1 tablet (5 mg total) by mouth at bedtime as needed for sleep., Disp: 30 tablet, Rfl: 2

## 2024-06-17 NOTE — Telephone Encounter (Signed)
 Patient advised to INCREASE daily warfarin dose (using 5 mg strength, peach-colored warfarin tablets) to one (1) tablet by mouth once daily. RTC [id number] at 1000h for repeat INR. No signs or symptoms of bleeding or embolic events. Adequate supply of warfarin on-hand he states.

## 2024-06-17 NOTE — Patient Instructions (Signed)
 Patient instructed to take medications as defined in the Anti-coagulation Track section of this encounter.  Patient instructed to take today's dose.  Patient instructed to take one (1) of your 5 mg strength, peach-colored warfarin tablets by mouth, once-daily every day. Patient verbalized understanding of these instructions.

## 2024-06-17 NOTE — Progress Notes (Signed)
Evaluation and management procedures were performed by the Clinical Pharmacy Practitioner under my supervision and collaboration. I have reviewed the Practitioner's note and chart, and I agree with the management and plan as documented above. ° °

## 2024-07-09 NOTE — Telephone Encounter (Unsigned)
 Copied from CRM [id number]. Topic: Referral - Request for Referral >> Jul 09, 2024 11:22 AM Graeme ORN wrote: Did the patient discuss referral with their provider in the last year? Yes (If No - schedule appointment) (If Yes - send message)  Appointment offered? No  Type of order/referral and detailed reason for visit: Has seen these before but changed insurance - no longer in network. [facility]. Needs New referrals for General Cleaning and Work Done [employer] -  would prefer [facility]   Preference of office, provider, location: N/A  If referral order, have you been seen by this specialty before? Yes (If Yes, this issue or another issue? When? Where? would prefer [facility]   Can we respond through MyChart? Yes

## 2024-07-15 ENCOUNTER — Ambulatory Visit: Payer: Self-pay | Admitting: Pharmacist

## 2024-07-15 DIAGNOSIS — Z86711 Personal history of pulmonary embolism: Secondary | ICD-10-CM | POA: Diagnosis not present

## 2024-07-15 DIAGNOSIS — I639 Cerebral infarction, unspecified: Secondary | ICD-10-CM

## 2024-07-15 DIAGNOSIS — Z86718 Personal history of other venous thrombosis and embolism: Secondary | ICD-10-CM

## 2024-07-15 DIAGNOSIS — Z7901 Long term (current) use of anticoagulants: Secondary | ICD-10-CM

## 2024-07-15 LAB — POCT INR: INR: 3.8 — AB (ref 2.0–3.0)

## 2024-07-15 MED ORDER — WARFARIN SODIUM 5 MG PO TABS: 5.0000 mg | ORAL_TABLET | Freq: Every day | ORAL | 1 refills | Status: AC

## 2024-07-15 NOTE — Progress Notes (Signed)
 Anticoagulation Management Alexander English is a 69 y.o. male who reports to the clinic for monitoring of warfarin treatment.    Indication: History of CVA, History of PE, Long term current use of oral anticoagulation, warfarin for INR of 2.0 - 3.0.   Duration: indefinite Supervising physician: Mliss Foot, MD  Anticoagulation Clinic Visit History: Patient does not report signs/symptoms of bleeding or thromboembolism  Other recent changes: No diet, medications, lifestyle changes cited by the patient.  Anticoagulation Episode Summary     Current INR goal:  2.0-3.0  TTR:  80.5% (12.3 y)  Next INR check:  08/12/2024  INR from last check:  3.8 (07/15/2024)  Weekly max warfarin dose:  --  Target end date:  Indefinite  INR check location:  Anticoagulation Clinic  Preferred lab:  --  Send INR reminders to:  [facility]   Indications   History of venous thromboembolism [Z86.718]        Comments:  --         Allergies[1] Current Medications[2] Past Medical History:  Diagnosis Date   Aortic atherosclerosis 10/23/2017   Asymptomatic, seen on CT scan   Bilateral cataracts 05/18/2016   Not visually significant   Cancer (HCC)    maxillary   Cerebrovascular accident (CVA) (HCC) 03/27/2020   Chronic constipation 06/12/2013   Chronic low back pain 06/12/2013   L4-5 facet arthropathy    Chronic pain of both shoulders 12/07/2013   A/C joint osteoarthritis    Cluster headaches    Resolved in 2006   COPD (chronic obstructive pulmonary disease) (HCC)    Dry eye syndrome, bilateral 05/18/2016   Embolic stroke involving right middle cerebral artery (HCC) 08/08/2004   Occured after traveling from [place]  to [place] where he was hospitalized for 1 month with no further work-up or therapy.  [place] and was admitted to [facility] immediately after landing. Presumed embolic per neuro but TEE, bubble study, and hypercoag W/U negative. On indefinite coumadin  per patient's  informed preference.  Residual effects : Left spastic hemiplegia. Tx with phenol tibi   Generalized anxiety disorder 08/03/2018   History of kidney stones    Hyperplastic colon polyp 07/27/2012   Excised endoscopically 07/27/2012   Hypertensive retinopathy of both eyes 05/18/2016   Internal and external hemorrhoids without complication 12/19/2017   Seen on colonoscopy 04/03/2007.   Left nephrolithiasis 08/31/2015   With mild left hydronephrosis.  Scheduled to undergo shockwave lithotripsy.   Obesity (BMI 30.0-34.9) 12/07/2013   Osteoarthritis of both knees 01/26/2012   Positive PPD 12/07/2013   Pulmonary embolism (HCC) 09/30/2004   Diagnosed in April 2006 after returning from [place].  Likely provoked as it occurred after a 12 hour plane flight within 2 months of R MCA CVA with left hemiparesis. Patient has made an informed decision to remain on lifelong warfarin.    Tubular adenoma 12/19/2017   Endoscopically excised 04/03/2007.   Urge incontinence of urine 08/31/2015   Possibly secondary to prior CVA.  Treated with Myrbetriq .   Social History   Socioeconomic History   Marital status: Single    Spouse name: Not on file   Number of children: 3   Years of education: 16   Highest education level: Not on file  Occupational History   Occupation: Retired  Tobacco Use   Smoking status: Former    Current packs/day: 0.00    Types: Cigarettes    Quit date: 06/28/2007    Years since quitting: 17.0   Smokeless  tobacco: Never  Vaping Use   Vaping status: Never Used  Substance and Sexual Activity   Alcohol use: Yes    Comment: Rarely.   Drug use: No   Sexual activity: Not on file  Other Topics Concern   Not on file  Social History Narrative   Born in [place] but has been in the [place]  for > [place] years.  Divorced, 1 daughter and 2 sons.  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   Right handed   Coffee/tea sometimes, soda rarely   Social Drivers  of Health   Tobacco Use: Medium Risk (05/10/2024)   Patient History    Smoking Tobacco Use: Former    Smokeless Tobacco Use: Never    Passive Exposure: Not on file  Financial Resource Strain: Low Risk (04/03/2024)   Overall Financial Resource Strain (CARDIA)    Difficulty of Paying Living Expenses: Not hard at all  Food Insecurity: No Food Insecurity (04/03/2024)   Epic    Worried About Programme Researcher, Broadcasting/film/video in the Last Year: Never true    Ran Out of Food in the Last Year: Never true  Transportation Needs: No Transportation Needs (04/03/2024)   Epic    Lack of Transportation (Medical): No    Lack of Transportation (Non-Medical): No  Physical Activity: Inactive (04/03/2024)   Exercise Vital Sign    Days of Exercise per Week: 0 days    Minutes of Exercise per Session: 0 min  Stress: No Stress Concern Present (04/03/2024)   [employer] of Occupational Health - Occupational Stress Questionnaire    Feeling of Stress: Not at all  Social Connections: Moderately Isolated (04/03/2024)   Social Connection and Isolation Panel    Frequency of Communication with Friends and Family: More than three times a week    Frequency of Social Gatherings with Friends and Family: More than three times a week    Attends Religious Services: More than 4 times per year    Active Member of Clubs or Organizations: No    Attends Banker Meetings: Never    Marital Status: Divorced  Depression (PHQ2-9): Low Risk (05/10/2024)   Depression (PHQ2-9)    PHQ-2 Score: 0  Alcohol Screen: Low Risk (04/03/2024)   Alcohol Screen    Last Alcohol Screening Score (AUDIT): 1  Housing: Low Risk (04/03/2024)   Epic    Unable to Pay for Housing in the Last Year: No    Number of Times Moved in the Last Year: 0    Homeless in the Last Year: No  Utilities: Not At Risk (04/03/2024)   Epic    Threatened with loss of utilities: No  Health Literacy: Adequate Health Literacy (04/03/2024)   B1300 Health Literacy     Frequency of need for help with medical instructions: Never   Family History  Problem Relation Age of Onset   Stroke Maternal Grandmother    Unexplained death Mother    Depression Mother        After husband's death   Unexplained death Father    Osteoarthritis Father        Knees   Early death Sister    Seizures Sister    Early death Brother 85       Unknown cause   Healthy Daughter    Healthy Son    Stroke Maternal Aunt    Healthy Sister    Healthy Sister    Unexplained death Brother  Healthy Brother    Healthy Son     ASSESSMENT Recent Results: The most recent result is correlated with 35 mg per week: Lab Results  Component Value Date   INR 3.8 (A) 07/15/2024   INR 1.7 (A) 06/17/2024   INR 2.4 05/06/2024    Anticoagulation Dosing: Description   Take one (1) of your 5 mg strength, peach-colored warfarin tablets by mouth, once-daily every day--EXCEPT on MONDAYS, take ONLY ONE-HALF (1/2) tablet on Mondays.      INR today: Supratherapeutic  PLAN Weekly dose was decreased by 7% to 32.5 mg per week  Patient Instructions  Patient instructed to take medications as defined in the Anti-coagulation Track section of this encounter.  Patient instructed to take today's dose.  Patient instructed to take one of your 5 mg strength, peach-colored warfarin tablets by mouth, once daily at ~ 4:00 PM--EXCEPT on MONDAYS, TAKE ONLY ONE-HALF (1/2) tablet on Mondays.  Patient verbalized understanding of these instructions.  Patient advised to contact clinic or seek medical attention if signs/symptoms of bleeding or thromboembolism occur.  Patient verbalized understanding by repeating back information and was advised to contact me if further medication-related questions arise. Patient was also provided an information handout.  Follow-up Return in about 4 weeks (around 08/12/2024) for Follow up INR.  Lynwood KATHEE Lites, PharmD, CPP Clinical Pharmacist Practitioner  15 minutes spent  face-to-face with the patient during the encounter. 50% of time spent on education, including signs/sx bleeding and clotting, as well as food and drug interactions with warfarin. 50% of time was spent on fingerprick POC INR sample collection,processing, results determination, and documentation in [email or web address].    [1] No Known Allergies [2]  Current Outpatient Medications:    aspirin  EC 81 MG tablet, Take 81 mg by mouth daily., Disp: , Rfl:    atorvastatin  (LIPITOR) 10 MG tablet, Take 1 tablet (10 mg total) by mouth daily., Disp: 90 tablet, Rfl: 3   benzoyl peroxide -erythromycin  (BENZAMYCIN) gel, Apply to affected area 2 times daily, Disp: 47 g, Rfl: 1   Blood Pressure Monitoring (OMRON 7 SERIES BP MONITOR) DEVI, Measure your blood pressure at home., Disp: 1 each, Rfl: 0   diclofenac  Sodium (VOLTAREN  ARTHRITIS PAIN) 1 % GEL, Apply 2 g topically 4 (four) times daily., Disp: 100 g, Rfl: 0   fesoterodine  (TOVIAZ ) 8 MG TB24 tablet, Take 1 tablet (8 mg total) by mouth daily., Disp: 90 tablet, Rfl: 3   mirabegron  ER (MYRBETRIQ ) 50 MG TB24 tablet, Take 1 tablet (50 mg total) by mouth daily., Disp: 90 tablet, Rfl: 3   Multiple Vitamin (MULTIVITAMIN) tablet, Take 1 tablet by mouth daily., Disp: , Rfl:    propranolol  ER (INDERAL  LA) 60 MG 24 hr capsule, Take 1 capsule (60 mg total) by mouth daily., Disp: 90 capsule, Rfl: 3   zolpidem  (AMBIEN ) 5 MG tablet, Take 1 tablet (5 mg total) by mouth at bedtime as needed for sleep., Disp: 30 tablet, Rfl: 2   Baclofen  5 MG TABS, Take 1 tablet (5 mg total) by mouth 3 (three) times daily as needed (spasticity). (Patient not taking: Reported on 07/15/2024), Disp: 90 tablet, Rfl: 2   ciclopirox  (PENLAC ) 8 % solution, Apply topically at bedtime. Apply over nail and surrounding skin. Apply daily over previous coat. After seven (7) days, may remove with alcohol and continue cycle. (Patient not taking: Reported on 07/15/2024), Disp: 6.6 mL, Rfl: 0   predniSONE   (DELTASONE ) 10 MG tablet, 6 tabs po day 1, 5 tabs po day 2,  4 tabs po day 3, 3 tabs po day 4, 2 tabs po day 5, 1 tab po day 6 (Patient not taking: Reported on 07/15/2024), Disp: 21 tablet, Rfl: 0   tamsulosin (FLOMAX) 0.4 MG CAPS capsule, Take 0.4 mg by mouth daily. (Patient not taking: Reported on 07/15/2024), Disp: , Rfl:    tazarotene  (AVAGE ) 0.1 % cream, Apply topically at bedtime. To any area of acne. (Patient not taking: Reported on 07/15/2024), Disp: 30 g, Rfl: 0   warfarin (COUMADIN ) 5 MG tablet, Take 1 tablet (5 mg total) by mouth daily at 4 PM., Disp: 90 tablet, Rfl: 1

## 2024-07-15 NOTE — Patient Instructions (Signed)
 Patient instructed to take medications as defined in the Anti-coagulation Track section of this encounter.  Patient instructed to take today's dose.  Patient instructed to take one of your 5 mg strength, peach-colored warfarin tablets by mouth, once daily at ~ 4:00 PM--EXCEPT on MONDAYS, TAKE ONLY ONE-HALF (1/2) tablet on Mondays.  Patient verbalized understanding of these instructions.

## 2024-07-16 NOTE — Progress Notes (Signed)
Evaluation and management procedures were performed by the Clinical Pharmacy Practitioner under my supervision and collaboration. I have reviewed the Practitioner's note and chart, and I agree with the management and plan as documented above. ° °

## 2024-07-17 ENCOUNTER — Ambulatory Visit: Admitting: Podiatry

## 2024-07-17 ENCOUNTER — Encounter: Payer: Self-pay | Admitting: Podiatry

## 2024-07-17 DIAGNOSIS — L603 Nail dystrophy: Secondary | ICD-10-CM

## 2024-07-17 NOTE — Progress Notes (Signed)
 This patient presents to the office for evaluation of his left big toenail.  He previously had his toenail removed and it has grown back thick long.  He states he is having pain walking in his left big toe.  He presents to the office today for evaluation and treatment. He has history of CVA and coagulation defect due to coumadin .  General Appearance  Alert, conversant and in no acute stress.  Vascular  Dorsalis pedis and posterior tibial  pulses are palpable  bilaterally.  Capillary return is within normal limits  bilaterally. Temperature is within normal limits  bilaterally.  Neurologic  Senn-Weinstein monofilament wire test within normal limits  bilaterally. Muscle power within normal limits bilaterally.  Nails The left hallux nail is thickened long and unattached. to the nail bed. No evidence of bacterial infection or drainage bilaterally.  Orthopedic  No limitations of motion  feet .  No crepitus or effusions noted.  No bony pathology or digital deformities noted.  Skin  normotropic skin with no porokeratosis noted bilaterally.  No signs of infections or ulcers noted.  Nail dystrophy left hallux  ROV.  Trim  Nail.  RTC 4 months

## 2024-08-05 ENCOUNTER — Ambulatory Visit: Payer: Self-pay | Admitting: Internal Medicine

## 2024-08-12 ENCOUNTER — Ambulatory Visit: Payer: Self-pay

## 2024-10-07 ENCOUNTER — Ambulatory Visit: Admitting: Urology

## 2024-11-14 ENCOUNTER — Ambulatory Visit: Admitting: Podiatry

## 2025-04-09 ENCOUNTER — Ambulatory Visit
